# Patient Record
Sex: Female | Born: 1960 | Race: Black or African American | Hispanic: No | Smoking: Current every day smoker
Health system: Southern US, Community
[De-identification: ages and names within clinical notes are randomized; demographics above are authoritative.]

## PROBLEM LIST (undated history)

## (undated) DIAGNOSIS — I10 Essential (primary) hypertension: Secondary | ICD-10-CM

## (undated) DIAGNOSIS — D72829 Elevated white blood cell count, unspecified: Secondary | ICD-10-CM

## (undated) DIAGNOSIS — R519 Headache, unspecified: Secondary | ICD-10-CM

## (undated) DIAGNOSIS — K219 Gastro-esophageal reflux disease without esophagitis: Secondary | ICD-10-CM

## (undated) DIAGNOSIS — K5909 Other constipation: Secondary | ICD-10-CM

## (undated) DIAGNOSIS — F32A Depression, unspecified: Secondary | ICD-10-CM

## (undated) DIAGNOSIS — E119 Type 2 diabetes mellitus without complications: Secondary | ICD-10-CM

## (undated) DIAGNOSIS — F419 Anxiety disorder, unspecified: Secondary | ICD-10-CM

## (undated) DIAGNOSIS — E785 Hyperlipidemia, unspecified: Secondary | ICD-10-CM

## (undated) DIAGNOSIS — G4733 Obstructive sleep apnea (adult) (pediatric): Secondary | ICD-10-CM

## (undated) DIAGNOSIS — Z9989 Dependence on other enabling machines and devices: Secondary | ICD-10-CM

## (undated) DIAGNOSIS — R51 Headache: Secondary | ICD-10-CM

## (undated) DIAGNOSIS — Z87440 Personal history of urinary (tract) infections: Secondary | ICD-10-CM

## (undated) DIAGNOSIS — F329 Major depressive disorder, single episode, unspecified: Secondary | ICD-10-CM

## (undated) HISTORY — DX: Major depressive disorder, single episode, unspecified: F32.9

## (undated) HISTORY — DX: Headache, unspecified: R51.9

## (undated) HISTORY — DX: Hyperlipidemia, unspecified: E78.5

## (undated) HISTORY — DX: Headache: R51

## (undated) HISTORY — DX: Depression, unspecified: F32.A

## (undated) HISTORY — DX: Gastro-esophageal reflux disease without esophagitis: K21.9

## (undated) HISTORY — DX: Essential (primary) hypertension: I10

## (undated) HISTORY — DX: Type 2 diabetes mellitus without complications: E11.9

## (undated) HISTORY — DX: Elevated white blood cell count, unspecified: D72.829

## (undated) HISTORY — DX: Obstructive sleep apnea (adult) (pediatric): G47.33

## (undated) HISTORY — DX: Anxiety disorder, unspecified: F41.9

## (undated) HISTORY — DX: Other constipation: K59.09

## (undated) HISTORY — DX: Morbid (severe) obesity due to excess calories: E66.01

## (undated) HISTORY — DX: Personal history of urinary (tract) infections: Z87.440

## (undated) HISTORY — DX: Dependence on other enabling machines and devices: Z99.89

---

## 1996-05-21 HISTORY — PX: ABDOMINAL HYSTERECTOMY: SHX81

## 1999-06-26 ENCOUNTER — Other Ambulatory Visit: Admission: RE | Admit: 1999-06-26 | Discharge: 1999-06-26 | Payer: Self-pay | Admitting: *Deleted

## 2000-07-26 ENCOUNTER — Other Ambulatory Visit: Admission: RE | Admit: 2000-07-26 | Discharge: 2000-07-26 | Payer: Self-pay | Admitting: *Deleted

## 2001-02-09 ENCOUNTER — Ambulatory Visit (HOSPITAL_BASED_OUTPATIENT_CLINIC_OR_DEPARTMENT_OTHER): Admission: RE | Admit: 2001-02-09 | Discharge: 2001-02-09 | Payer: Self-pay | Admitting: Family Medicine

## 2004-11-08 ENCOUNTER — Ambulatory Visit: Payer: Self-pay | Admitting: Cardiology

## 2009-03-18 ENCOUNTER — Ambulatory Visit: Payer: Self-pay | Admitting: Cardiology

## 2010-05-21 HISTORY — PX: COLONOSCOPY: SHX174

## 2015-03-22 ENCOUNTER — Other Ambulatory Visit: Payer: Self-pay | Admitting: "Endocrinology

## 2015-04-20 ENCOUNTER — Ambulatory Visit: Payer: Self-pay | Admitting: "Endocrinology

## 2015-04-29 ENCOUNTER — Other Ambulatory Visit: Payer: Self-pay | Admitting: "Endocrinology

## 2015-08-08 LAB — HEMOGLOBIN A1C: Hemoglobin A1C: 9.4

## 2015-08-16 ENCOUNTER — Other Ambulatory Visit: Payer: Self-pay | Admitting: "Endocrinology

## 2015-08-22 ENCOUNTER — Ambulatory Visit (INDEPENDENT_AMBULATORY_CARE_PROVIDER_SITE_OTHER): Payer: BLUE CROSS/BLUE SHIELD | Admitting: "Endocrinology

## 2015-08-22 ENCOUNTER — Encounter: Payer: Self-pay | Admitting: "Endocrinology

## 2015-08-22 VITALS — BP 153/81 | HR 89 | Ht 65.0 in | Wt 223.0 lb

## 2015-08-22 DIAGNOSIS — E118 Type 2 diabetes mellitus with unspecified complications: Secondary | ICD-10-CM | POA: Diagnosis not present

## 2015-08-22 DIAGNOSIS — Z9119 Patient's noncompliance with other medical treatment and regimen: Secondary | ICD-10-CM | POA: Insufficient documentation

## 2015-08-22 DIAGNOSIS — I10 Essential (primary) hypertension: Secondary | ICD-10-CM | POA: Diagnosis not present

## 2015-08-22 DIAGNOSIS — IMO0002 Reserved for concepts with insufficient information to code with codable children: Secondary | ICD-10-CM

## 2015-08-22 DIAGNOSIS — E1165 Type 2 diabetes mellitus with hyperglycemia: Secondary | ICD-10-CM | POA: Diagnosis not present

## 2015-08-22 DIAGNOSIS — Z91199 Patient's noncompliance with other medical treatment and regimen due to unspecified reason: Secondary | ICD-10-CM | POA: Insufficient documentation

## 2015-08-22 DIAGNOSIS — E782 Mixed hyperlipidemia: Secondary | ICD-10-CM | POA: Insufficient documentation

## 2015-08-22 DIAGNOSIS — E785 Hyperlipidemia, unspecified: Secondary | ICD-10-CM | POA: Diagnosis not present

## 2015-08-22 MED ORDER — DAPAGLIFLOZIN PROPANEDIOL 10 MG PO TABS: 10.0000 mg | ORAL_TABLET | Freq: Every day | ORAL | Status: DC

## 2015-08-22 MED ORDER — LIRAGLUTIDE 18 MG/3ML ~~LOC~~ SOPN: 1.8000 mg | PEN_INJECTOR | Freq: Every day | SUBCUTANEOUS | Status: DC

## 2015-08-22 NOTE — Progress Notes (Signed)
Subjective:    Patient ID: Casey Johnston, female    DOB: Oct 02, 1960, PCP No primary care provider on file.   Past Medical History  Diagnosis Date  . Diabetes mellitus, type II ([place])   . Hyperlipidemia    Past Surgical History  Procedure Laterality Date  . Abdominal hysterectomy     Social History   Social History  . Marital Status: Widowed    Spouse Name: N/A  . Number of Children: N/A  . Years of Education: N/A   Social History Main Topics  . Smoking status: Former Research scientist (life sciences)  . Smokeless tobacco: None  . Alcohol Use: No  . Drug Use: No  . Sexual Activity: Not Asked   Other Topics Concern  . None   Social History Narrative  . None   Outpatient Encounter Prescriptions as of 08/22/2015  Medication Sig  . dapagliflozin propanediol (FARXIGA) 10 MG TABS tablet Take 10 mg by mouth daily.  Marland Kitchen LANTUS SOLOSTAR 100 UNIT/ML Solostar Pen INJECT 40 UNITS SUBCUTANEOUSLY AT BEDTIME  . Liraglutide (VICTOZA) 18 MG/3ML SOPN Inject 0.3 mLs (1.8 mg total) into the skin daily.  . metFORMIN (GLUCOPHAGE) 1000 MG tablet TAKE ONE TABLET BY MOUTH TWICE DAILY  . simvastatin (ZOCOR) 20 MG tablet Take 20 mg by mouth daily.  . Vitamin D, Ergocalciferol, (DRISDOL) 50000 units CAPS capsule Take 50,000 Units by mouth every 7 (seven) days.  . [DISCONTINUED] FARXIGA 10 MG TABS tablet TAKE ONE TABLET BY MOUTH ONCE DAILY  . [DISCONTINUED] Liraglutide (VICTOZA) 18 MG/3ML SOPN Inject 1.8 mg into the skin daily.   No facility-administered encounter medications on file as of 08/22/2015.   ALLERGIES: No Known Allergies VACCINATION STATUS:  There is no immunization history on file for this patient.  Diabetes She presents for her follow-up diabetic visit. She has type 2 diabetes mellitus. Onset time: She was diagnosed at approximate age of 32 years. Her disease course has been worsening. There are no hypoglycemic associated symptoms. Pertinent negatives for hypoglycemia include no confusion, headaches,  pallor or seizures. Associated symptoms include fatigue, polydipsia, polyuria and visual change. Pertinent negatives for diabetes include no chest pain and no polyphagia. There are no hypoglycemic complications. Symptoms are worsening. There are no diabetic complications. Risk factors for coronary artery disease include diabetes mellitus, dyslipidemia, hypertension, obesity, sedentary lifestyle and tobacco exposure. Current diabetic treatments: She has not been consistent in taking her medications. She is compliant with treatment none of the time. Her weight is stable. She is following a generally unhealthy diet. She never participates in exercise. Home blood sugar record trend: She came with no meter nor logs. Her A1c is higher at 9.4% from 08/08/2015. Her overall blood glucose range is >200 mg/dl. An ACE inhibitor/angiotensin II receptor blocker is not being taken.  Hyperlipidemia This is a chronic problem. The current episode started more than 1 year ago. Pertinent negatives include no chest pain, myalgias or shortness of breath. Current antihyperlipidemic treatment includes statins. Risk factors for coronary artery disease include a sedentary lifestyle, dyslipidemia and diabetes mellitus.  Hypertension This is a chronic problem. The current episode started more than 1 year ago. Pertinent negatives include no chest pain, headaches, palpitations or shortness of breath. Risk factors for coronary artery disease include smoking/tobacco exposure, obesity, dyslipidemia, diabetes mellitus and sedentary lifestyle.     Review of Systems  Constitutional: Positive for fatigue. Negative for fever, chills and unexpected weight change.  HENT: Negative for trouble swallowing and voice change.   Eyes: Negative  for visual disturbance.  Respiratory: Negative for cough, shortness of breath and wheezing.   Cardiovascular: Negative for chest pain, palpitations and leg swelling.  Gastrointestinal: Negative for nausea,  vomiting and diarrhea.  Endocrine: Positive for polydipsia and polyuria. Negative for cold intolerance, heat intolerance and polyphagia.  Musculoskeletal: Negative for myalgias and arthralgias.  Skin: Negative for color change, pallor, rash and wound.  Neurological: Negative for seizures and headaches.  Psychiatric/Behavioral: Negative for suicidal ideas and confusion.    Objective:    BP 153/81 mmHg  Pulse 89  Ht 5\' 5"  (1.651 m)  Wt 223 lb (101.152 kg)  BMI 37.11 kg/m2  SpO2 96%  Wt Readings from Last 3 Encounters:  08/22/15 223 lb (101.152 kg)    Physical Exam  Constitutional: She is oriented to person, place, and time. She appears well-developed.  HENT:  Head: Normocephalic and atraumatic.  Eyes: EOM are normal.  Neck: Normal range of motion. Neck supple. No tracheal deviation present. No thyromegaly present.  Cardiovascular: Normal rate and regular rhythm.   Pulmonary/Chest: Effort normal and breath sounds normal.  Abdominal: Soft. Bowel sounds are normal. There is no tenderness. There is no guarding.  Musculoskeletal: Normal range of motion. She exhibits no edema.  Neurological: She is alert and oriented to person, place, and time. She has normal reflexes. No cranial nerve deficit. Coordination normal.  Skin: Skin is warm and dry. No rash noted. No erythema. No pallor.  Psychiatric: She has a normal mood and affect. Judgment normal.        Assessment & Plan:   1. Uncontrolled type 2 diabetes mellitus with complication, without long-term current use of insulin ([place])   - Her diabetes is  complicated by noncompliance and nonadherence and patient remains at a high risk for more acute and chronic complications of diabetes which include CAD, CVA, CKD, retinopathy, and neuropathy. These are all discussed in detail with the patient.  Patient came with elevated A1c of 9.4%, without any logs nor meter. -She is alarmingly noncompliant and displays unconcerned affect.  - I  have re-counseled the patient on diet management and weight loss  by adopting a carbohydrate restricted / protein rich  Diet.  - Suggestion is made for patient to avoid simple carbohydrates   from their diet including Cakes , Desserts, Ice Cream,  Soda (  diet and regular) , Sweet Tea , Candies,  Chips, Cookies, Artificial Sweeteners,   and "Sugar-free" Products .  This will help patient to have stable blood glucose profile and potentially avoid unintended  Weight gain.  - Patient is advised to stick to a routine mealtimes to eat 3 meals  a day and avoid unnecessary snacks ( to snack only to correct hypoglycemia).  - The patient  has been  scheduled with Jearld Fenton, RDN, CDE for individualized DM education.  - I have approached patient with the following individualized plan to manage diabetes and patient agrees.  - I will proceed with basal insulin 40 units QHS,  and initiate strict monitoring of glucose  AC and HS. -If her readings are significantly above target, she will be approached for a brief period of basal/bolus insulin therapy.  -Patient is encouraged to call clinic for blood glucose levels less than 70 or above 300 mg /dl. - I will continue Victoza 1.8 mg daily and metformin 1000 mg by mouth twice a day as well as  Farxiga 10 mg by mouth daily. Side effects and precautions discussed with her.  - Patient specific  target  for A1c; LDL, HDL, Triglycerides, and  Waist Circumference were discussed in detail.  2) BP/HTN: Controlled . Continue current medications. 3) Lipids/HPL:  continue statins. 4)  Weight/Diet: CDE consult in progress, exercise, and carbohydrates information provided.  5) Chronic Care/Health Maintenance:  -Patient  is on  statin medications and encouraged to continue to follow up with Ophthalmology, Podiatrist at least yearly or according to recommendations, and advised to  stay away from smoking. I have recommended yearly flu vaccine and pneumonia vaccination at  least every 5 years; moderate intensity exercise for up to 150 minutes weekly; and  sleep for at least 7 hours a day.  - 25 minutes of time was spent on the care of this patient , 50% of which was applied for counseling on diabetes complications and their preventions.  - I advised patient to maintain close follow up with No primary care provider on file. for primary care needs.  Patient is asked to bring meter and  blood glucose logs during their next visit.   Follow up plan: -Return in about 1 week (around 08/29/2015) for diabetes, high blood pressure, high cholesterol, follow up with meter and logs- no labs.  Glade Lloyd, MD Phone: [phone]  Fax: [phone]   08/22/2015, 7:28 PM

## 2015-08-30 ENCOUNTER — Encounter: Payer: Self-pay | Admitting: "Endocrinology

## 2015-09-05 ENCOUNTER — Ambulatory Visit: Payer: BLUE CROSS/BLUE SHIELD | Admitting: "Endocrinology

## 2015-09-21 ENCOUNTER — Other Ambulatory Visit: Payer: Self-pay | Admitting: "Endocrinology

## 2015-12-07 ENCOUNTER — Other Ambulatory Visit: Payer: Self-pay | Admitting: "Endocrinology

## 2015-12-15 ENCOUNTER — Other Ambulatory Visit: Payer: Self-pay

## 2015-12-15 MED ORDER — INSULIN DETEMIR 100 UNIT/ML FLEXPEN: 40.0000 [IU] | Freq: Every day | SUBCUTANEOUS | 2 refills | Status: DC

## 2015-12-15 MED ORDER — INSULIN DETEMIR 100 UNIT/ML FLEXPEN: 40.0000 [IU] | PEN_INJECTOR | Freq: Every day | SUBCUTANEOUS | 2 refills | Status: DC

## 2015-12-20 DIAGNOSIS — D72829 Elevated white blood cell count, unspecified: Secondary | ICD-10-CM

## 2015-12-20 HISTORY — DX: Elevated white blood cell count, unspecified: D72.829

## 2016-01-18 ENCOUNTER — Ambulatory Visit (INDEPENDENT_AMBULATORY_CARE_PROVIDER_SITE_OTHER): Payer: BLUE CROSS/BLUE SHIELD | Admitting: Family Medicine

## 2016-01-18 ENCOUNTER — Encounter: Payer: Self-pay | Admitting: Family Medicine

## 2016-01-18 VITALS — BP 164/88 | HR 90 | Temp 98.0°F | Resp 16 | Ht 65.0 in | Wt 215.0 lb

## 2016-01-18 DIAGNOSIS — E785 Hyperlipidemia, unspecified: Secondary | ICD-10-CM | POA: Diagnosis not present

## 2016-01-18 DIAGNOSIS — F331 Major depressive disorder, recurrent, moderate: Secondary | ICD-10-CM | POA: Diagnosis not present

## 2016-01-18 DIAGNOSIS — I10 Essential (primary) hypertension: Secondary | ICD-10-CM

## 2016-01-18 DIAGNOSIS — R5382 Chronic fatigue, unspecified: Secondary | ICD-10-CM

## 2016-01-18 DIAGNOSIS — R7989 Other specified abnormal findings of blood chemistry: Secondary | ICD-10-CM | POA: Diagnosis not present

## 2016-01-18 LAB — CBC WITH DIFFERENTIAL/PLATELET
BASOS ABS: 0.1 10*3/uL (ref 0.0–0.1)
BASOS PCT: 0.8 % (ref 0.0–3.0)
EOS ABS: 0.1 10*3/uL (ref 0.0–0.7)
Eosinophils Relative: 0.9 % (ref 0.0–5.0)
HCT: 39.7 % (ref 36.0–46.0)
Hemoglobin: 13.4 g/dL (ref 12.0–15.0)
LYMPHS ABS: 6.4 10*3/uL — AB (ref 0.7–4.0)
Lymphocytes Relative: 51.9 % — ABNORMAL HIGH (ref 12.0–46.0)
MCHC: 33.9 g/dL (ref 30.0–36.0)
MCV: 88.6 fl (ref 78.0–100.0)
MONO ABS: 0.3 10*3/uL (ref 0.1–1.0)
Monocytes Relative: 2.6 % — ABNORMAL LOW (ref 3.0–12.0)
NEUTROS ABS: 5.4 10*3/uL (ref 1.4–7.7)
NEUTROS PCT: 43.8 % (ref 43.0–77.0)
PLATELETS: 228 10*3/uL (ref 150.0–400.0)
RBC: 4.47 Mil/uL (ref 3.87–5.11)
RDW: 13.5 % (ref 11.5–15.5)
WBC: 12.2 10*3/uL — ABNORMAL HIGH (ref 4.0–10.5)

## 2016-01-18 LAB — VITAMIN B12: VITAMIN B 12: 416 pg/mL (ref 211–911)

## 2016-01-18 LAB — COMPREHENSIVE METABOLIC PANEL
ALBUMIN: 4 g/dL (ref 3.5–5.2)
ALT: 31 U/L (ref 0–35)
AST: 31 U/L (ref 0–37)
Alkaline Phosphatase: 86 U/L (ref 39–117)
BUN: 9 mg/dL (ref 6–23)
CHLORIDE: 100 meq/L (ref 96–112)
CO2: 28 meq/L (ref 19–32)
Calcium: 9.2 mg/dL (ref 8.4–10.5)
Creatinine, Ser: 0.65 mg/dL (ref 0.40–1.20)
GFR: 121.08 mL/min (ref >60.00)
Glucose, Bld: 267 mg/dL — ABNORMAL HIGH (ref 70–99)
POTASSIUM: 4.4 meq/L (ref 3.5–5.1)
SODIUM: 136 meq/L (ref 135–145)
Total Bilirubin: 0.3 mg/dL (ref 0.2–1.2)
Total Protein: 7.3 g/dL (ref 6.0–8.3)

## 2016-01-18 LAB — LIPID PANEL
Cholesterol: 237 mg/dL — ABNORMAL HIGH (ref 0–200)
HDL: 41.3 mg/dL (ref >39.00)
Total CHOL/HDL Ratio: 6

## 2016-01-18 LAB — TSH: TSH: 1.23 u[IU]/mL (ref 0.35–4.50)

## 2016-01-18 LAB — LDL CHOLESTEROL, DIRECT: LDL DIRECT: 119 mg/dL

## 2016-01-18 MED ORDER — DULOXETINE HCL 30 MG PO CPEP: 30.0000 mg | ORAL_CAPSULE | Freq: Every day | ORAL | 1 refills | Status: DC

## 2016-01-18 NOTE — Progress Notes (Signed)
Office Note 01/18/2016  CC:  Chief Complaint  Patient presents with  . Establish Care    HPI:  Casey Johnston is a 55 y.o. Black female who is here to establish care. Patient's most recent primary MD: MD in [place] who she last saw a few years ago b/c MD moved. Old records were not reviewed prior to or during today's visit.  About 2 yrs of chronic fatigue and excessive daytime sleepiness.  Works 7[employer].  Gets about 6 hours of sleep per night. Sleeps a lot on weekends.  She snores, has witnessed OSA.  Has been dx'd with OSA and sleeps with CPAP but not lately b/c it stopped briefly and then started back up.  She feels like she is due for new CPAP machine ([place] apoth). Admits to feeling depressed, anhedonic, no motivation to do anything.  Appetite is fair.  She is anxious about her son going to [place].  Says she was on antidepressant many years ago--amitriptyline.  No SI or HI.  Past Medical History:  Diagnosis Date  . Diabetes mellitus, type II ([place])    Dr. Dorris Fetch managing  . Frequent headaches chronic   ibuprofen helps some  . History of UTI    Recurrent per pt (approx 3 per year)  . Hyperlipidemia   . Hypertension   . Morbid obesity ([place])   . OSA on CPAP     Past Surgical History:  Procedure Laterality Date  . ABDOMINAL HYSTERECTOMY     Dysfunctional uterine bleeding.  No hx of abnormal paps.  . COLONOSCOPY  approx 2012   Pt states it was done at [facility] by a GI MD from [place].  Recall 10 yrs.    Family History  Problem Relation Age of Onset  . Cancer Mother   . COPD Father     Social History   Social History  . Marital status: Widowed    Spouse name: N/A  . Number of children: N/A  . Years of education: N/A   Occupational History  . Not on file.   Social History Main Topics  . Smoking status: Former Research scientist (life sciences)  . Smokeless tobacco: Never Used  . Alcohol use No  . Drug use: No  . Sexual activity: Not on file   Other Topics Concern  . Not on  file   Social History Narrative   Separated x 20 yrs, one daughter.  She lives with her.  Great niece and nephew live with her as well.   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   Tob: 7 pack year hx, quit 2010.   Alc: none   Exercise: none    Outpatient Encounter Prescriptions as of 01/18/2016  Medication Sig  . dapagliflozin propanediol (FARXIGA) 10 MG TABS tablet Take 10 mg by mouth daily.  . Insulin Detemir (LEVEMIR FLEXPEN) 100 UNIT/ML Pen Inject 40 Units into the skin at bedtime.  . Liraglutide (VICTOZA) 18 MG/3ML SOPN Inject 0.3 mLs (1.8 mg total) into the skin daily.  . metFORMIN (GLUCOPHAGE) 1000 MG tablet TAKE ONE TABLET BY MOUTH TWICE DAILY  . DULoxetine (CYMBALTA) 30 MG capsule Take 1 capsule (30 mg total) by mouth daily.  . simvastatin (ZOCOR) 20 MG tablet Take 20 mg by mouth daily.  . Vitamin D, Ergocalciferol, (DRISDOL) 50000 units CAPS capsule Take 50,000 Units by mouth every 7 (seven) days.   No facility-administered encounter medications on file as of 01/18/2016.     No Known Allergies  ROS Review of Systems  Constitutional: Positive  for fatigue (see HPI). Negative for fever.  HENT: Negative for congestion and sore throat.   Eyes: Negative for visual disturbance.  Respiratory: Negative for cough.   Cardiovascular: Negative for chest pain.  Gastrointestinal: Negative for abdominal pain and nausea.  Genitourinary: Negative for dysuria.  Musculoskeletal: Negative for back pain and joint swelling.  Skin: Negative for rash.  Neurological: Negative for weakness and headaches.  Hematological: Negative for adenopathy.    PE; Blood pressure (!) 164/88, pulse 90, temperature 98 F (36.7 C), temperature source Oral, resp. rate 16, height 5\' 5"  (1.651 m), weight 215 lb (97.5 kg), SpO2 96 %. Gen: Alert, well appearing.  Patient is oriented to person, place, time, and situation. AFFECT: pleasant, lucid thought and speech. Tearful at times when discussing her  depression. CV: RRR, no m/r/g.   LUNGS: CTA bilat, nonlabored resps, good aeration in all lung fields. EXT: no clubbing or cyanosis.  Trace to 1+ pitting edema in both pretibial regions.  Pertinent labs:   Lab Results  Component Value Date   HGBA1C 9.4 08/08/2015   08/08/15 CMP at Dr. Liliane Channel was all normal--this has been scanned into lab section of chart.   ASSESSMENT AND PLAN:   New pt: no PCP records to obtain--pt cannot recall name of clinic where she used to go in [place].  1) DM 2: managed by Dr. Dorris Fetch at [facility] end[facility]ology.  Pt has plans for routine f/u soon.  2) HTN: BP elevated here today.  She was upset/discussing depression. No change in med made today. Check lytes/cr.  3) HLD: tolerating statin.  FLP check today + AST/ALT.  4) Major depressive disorder, moderate, recurrent:  Start duloxetine 30mg  qd.  Therapeutic expectations and side effect profile of medication discussed today.  Patient's questions answered.  5) Chronic fatigue: suspect this is related to her stressfull life + depression. She repeatedly said she has "a lot going on at home and at work".  Additionally, she needs to contact [place] about possibly getting a new CPAP b/c going w/out this will compound her fatigue. Check CBC, TSH, and vit B12 level today.  She is due for pneumovax and Tdap but declined these today. She is due for mammogram but wants to wait on ordering this--plus she says that in the past a remote imaging vehicle would go to [employer] for women to get this done.  An After Visit Summary was printed and given to the patient.  Signed:  Crissie Sickles, MD           01/18/2016  Return in about 4 weeks (around 02/15/2016) for f/u depression/fatigue.  Signed:  Crissie Sickles, MD           01/18/2016

## 2016-01-19 ENCOUNTER — Other Ambulatory Visit: Payer: BLUE CROSS/BLUE SHIELD

## 2016-01-19 DIAGNOSIS — D72829 Elevated white blood cell count, unspecified: Secondary | ICD-10-CM

## 2016-01-20 LAB — PATHOLOGIST SMEAR REVIEW

## 2016-01-24 ENCOUNTER — Ambulatory Visit: Payer: Self-pay | Admitting: Family Medicine

## 2016-02-17 ENCOUNTER — Ambulatory Visit (INDEPENDENT_AMBULATORY_CARE_PROVIDER_SITE_OTHER): Payer: BLUE CROSS/BLUE SHIELD | Admitting: Family Medicine

## 2016-02-17 ENCOUNTER — Encounter: Payer: Self-pay | Admitting: Family Medicine

## 2016-02-17 VITALS — BP 145/84 | HR 99 | Temp 97.9°F | Resp 16 | Wt 215.8 lb

## 2016-02-17 DIAGNOSIS — F331 Major depressive disorder, recurrent, moderate: Secondary | ICD-10-CM

## 2016-02-17 DIAGNOSIS — Z23 Encounter for immunization: Secondary | ICD-10-CM | POA: Diagnosis not present

## 2016-02-17 DIAGNOSIS — D72829 Elevated white blood cell count, unspecified: Secondary | ICD-10-CM | POA: Diagnosis not present

## 2016-02-17 LAB — CBC WITH DIFFERENTIAL/PLATELET
BASOS PCT: 0 %
Basophils Absolute: 0 cells/uL (ref 0–200)
EOS ABS: 250 {cells}/uL (ref 15–500)
Eosinophils Relative: 2 %
HEMATOCRIT: 38.9 % (ref 35.0–45.0)
HEMOGLOBIN: 13.1 g/dL (ref 11.7–15.5)
LYMPHS PCT: 60 %
Lymphs Abs: 7500 cells/uL — ABNORMAL HIGH (ref 850–3900)
MCH: 30.1 pg (ref 27.0–33.0)
MCHC: 33.7 g/dL (ref 32.0–36.0)
MCV: 89.4 fL (ref 80.0–100.0)
MONO ABS: 500 {cells}/uL (ref 200–950)
MPV: 11.8 fL (ref 7.5–12.5)
Monocytes Relative: 4 %
Neutro Abs: 4250 cells/uL (ref 1500–7800)
Neutrophils Relative %: 34 %
Platelets: 234 10*3/uL (ref 140–400)
RBC: 4.35 MIL/uL (ref 3.80–5.10)
RDW: 13.7 % (ref 11.0–15.0)
WBC: 12.5 10*3/uL — ABNORMAL HIGH (ref 3.8–10.8)

## 2016-02-17 MED ORDER — DULOXETINE HCL 30 MG PO CPEP: 30.0000 mg | ORAL_CAPSULE | Freq: Every day | ORAL | 6 refills | Status: DC

## 2016-02-17 NOTE — Addendum Note (Signed)
Addended by: Gordy Councilman on: 02/17/2016 04:29 PM   Modules accepted: Orders

## 2016-02-17 NOTE — Progress Notes (Signed)
Pre visit review using our clinic review tool, if applicable. No additional management support is needed unless otherwise documented below in the visit note. 

## 2016-02-17 NOTE — Progress Notes (Signed)
OFFICE VISIT  02/17/2016   CC:  Chief Complaint  Patient presents with  . Follow-up    chronic fatigue   HPI:    Patient is a 55 y.o.  female who presents for 1 mo f/u MDD, started 30mg  qd duloxetine last visit. Feels like she is improved.  Sleep is normalizing.  Anhedonia lessening.  Appetite is normal.  Energy level coming back a little. Denies side effect (initially had nausea and HA but this resolved after a week or two). She seems satisfied with her response to the med.  She has not made any steps towards figuring out the problem with her CPAP.  Needs to contact [employer].    Last time she was here we did some lab work and her WBC count was slightly elevated, pathologist smear review was reassuring.  We are going to repeat the cbc w/diff today.  Past Medical History:  Diagnosis Date  . Anxiety and depression   . Diabetes mellitus, type II ([place])    Dr. Dorris Fetch managing  . Frequent headaches chronic   ibuprofen helps some  . History of UTI    Recurrent per pt (approx 3 per year)  . Hyperlipidemia   . Hypertension   . Morbid obesity ([place])   . OSA on CPAP   OSA NOT ON CPAP  Past Surgical History:  Procedure Laterality Date  . ABDOMINAL HYSTERECTOMY     Dysfunctional uterine bleeding.  No hx of abnormal paps.  . COLONOSCOPY  approx 2012   Pt states it was done at [facility] by a GI MD from [place].  Recall 10 yrs.    Outpatient Medications Prior to Visit  Medication Sig Dispense Refill  . dapagliflozin propanediol (FARXIGA) 10 MG TABS tablet Take 10 mg by mouth daily. 30 tablet 2  . Insulin Detemir (LEVEMIR FLEXPEN) 100 UNIT/ML Pen Inject 40 Units into the skin at bedtime. 15 mL 2  . Liraglutide (VICTOZA) 18 MG/3ML SOPN Inject 0.3 mLs (1.8 mg total) into the skin daily. 6 mL 2  . metFORMIN (GLUCOPHAGE) 1000 MG tablet TAKE ONE TABLET BY MOUTH TWICE DAILY 180 tablet 0  . simvastatin (ZOCOR) 20 MG tablet Take 20 mg by mouth daily.    . DULoxetine (CYMBALTA) 30 MG  capsule Take 1 capsule (30 mg total) by mouth daily. 30 capsule 1  . Vitamin D, Ergocalciferol, (DRISDOL) 50000 units CAPS capsule Take 50,000 Units by mouth every 7 (seven) days.     No facility-administered medications prior to visit.     No Known Allergies  ROS As per HPI  PE: Blood pressure (!) 145/84, pulse 99, temperature 97.9 F (36.6 C), temperature source Oral, resp. rate 16, weight 215 lb 12.8 oz (97.9 kg), SpO2 97 %. Gen: Alert, well appearing.  Patient is oriented to person, place, time, and situation. AFFECT: pleasant, lucid thought and speech. No further exam today.  LABS:  Lab Results  Component Value Date   TSH 1.23 01/18/2016   Lab Results  Component Value Date   WBC 12.2 (H) 01/18/2016   HGB 13.4 01/18/2016   HCT 39.7 01/18/2016   MCV 88.6 01/18/2016   PLT 228.0 01/18/2016   Lab Results  Component Value Date   CREATININE 0.65 01/18/2016   BUN 9 01/18/2016   NA 136 01/18/2016   K 4.4 01/18/2016   CL 100 01/18/2016   CO2 28 01/18/2016   Lab Results  Component Value Date   ALT 31 01/18/2016   AST 31 01/18/2016  ALKPHOS 86 01/18/2016   BILITOT 0.3 01/18/2016   Lab Results  Component Value Date   CHOL 237 (H) 01/18/2016   Lab Results  Component Value Date   HDL 41.30 01/18/2016   No results found for: [facility] Lab Results  Component Value Date   TRIG (H) 01/18/2016    419.0 Triglyceride is over 400; calculations on Lipids are invalid.   Lab Results  Component Value Date   CHOLHDL 6 01/18/2016   Lab Results  Component Value Date   HGBA1C 9.4 08/08/2015    IMPRESSION AND PLAN:  1) MDD, moderate, recurrent: improving significantly on cymbalta 30mg  qd. The current medical regimen is effective;  continue present plan and medications.  2) Mild leukocytosis: path smear review reasurring.  Repeat CBC w/diff today to make sure stable/normalized.  3) Preventative health care: she declined flu vaccine but she did get TdaP today.  An  After Visit Summary was printed and given to the patient.  FOLLOW UP: Return in about 3 months (around 05/18/2016) for routine chronic illness f/u.  Signed:  Crissie Sickles, MD           02/17/2016

## 2016-02-20 ENCOUNTER — Encounter: Payer: Self-pay | Admitting: Family Medicine

## 2016-03-12 ENCOUNTER — Ambulatory Visit: Payer: BLUE CROSS/BLUE SHIELD | Admitting: Family Medicine

## 2016-03-16 ENCOUNTER — Other Ambulatory Visit: Payer: Self-pay

## 2016-03-16 MED ORDER — BLOOD GLUCOSE MONITOR KIT: PACK | 0 refills | Status: DC

## 2016-04-24 ENCOUNTER — Other Ambulatory Visit: Payer: Self-pay | Admitting: "Endocrinology

## 2016-05-16 ENCOUNTER — Other Ambulatory Visit: Payer: Self-pay | Admitting: "Endocrinology

## 2016-05-16 DIAGNOSIS — E1165 Type 2 diabetes mellitus with hyperglycemia: Secondary | ICD-10-CM

## 2016-05-16 DIAGNOSIS — E118 Type 2 diabetes mellitus with unspecified complications: Principal | ICD-10-CM

## 2016-05-18 ENCOUNTER — Ambulatory Visit: Payer: BLUE CROSS/BLUE SHIELD | Admitting: Family Medicine

## 2016-05-26 LAB — COMPREHENSIVE METABOLIC PANEL
ALBUMIN: 3.9 g/dL (ref 3.6–5.1)
ALK PHOS: 81 U/L (ref 33–130)
ALT: 22 U/L (ref 6–29)
AST: 20 U/L (ref 10–35)
BUN: 12 mg/dL (ref 7–25)
CALCIUM: 9.9 mg/dL (ref 8.6–10.4)
CO2: 23 mmol/L (ref 20–31)
Chloride: 103 mmol/L (ref 98–110)
Creat: 0.75 mg/dL (ref 0.50–1.05)
GLUCOSE: 344 mg/dL — AB (ref 65–99)
POTASSIUM: 4.2 mmol/L (ref 3.5–5.3)
Sodium: 138 mmol/L (ref 135–146)
TOTAL PROTEIN: 7 g/dL (ref 6.1–8.1)
Total Bilirubin: 0.4 mg/dL (ref 0.2–1.2)

## 2016-05-28 LAB — HEMOGLOBIN A1C: Hgb A1c MFr Bld: >14 % — ABNORMAL HIGH (ref <5.7)

## 2016-07-09 ENCOUNTER — Ambulatory Visit (INDEPENDENT_AMBULATORY_CARE_PROVIDER_SITE_OTHER): Payer: BLUE CROSS/BLUE SHIELD | Admitting: "Endocrinology

## 2016-07-09 ENCOUNTER — Encounter: Payer: Self-pay | Admitting: "Endocrinology

## 2016-07-09 VITALS — BP 170/83 | HR 81 | Ht 65.0 in | Wt 204.0 lb

## 2016-07-09 DIAGNOSIS — Z91199 Patient's noncompliance with other medical treatment and regimen due to unspecified reason: Secondary | ICD-10-CM

## 2016-07-09 DIAGNOSIS — I10 Essential (primary) hypertension: Secondary | ICD-10-CM | POA: Diagnosis not present

## 2016-07-09 DIAGNOSIS — E782 Mixed hyperlipidemia: Secondary | ICD-10-CM

## 2016-07-09 DIAGNOSIS — IMO0002 Reserved for concepts with insufficient information to code with codable children: Secondary | ICD-10-CM

## 2016-07-09 DIAGNOSIS — E118 Type 2 diabetes mellitus with unspecified complications: Secondary | ICD-10-CM

## 2016-07-09 DIAGNOSIS — Z9119 Patient's noncompliance with other medical treatment and regimen: Secondary | ICD-10-CM

## 2016-07-09 DIAGNOSIS — E1165 Type 2 diabetes mellitus with hyperglycemia: Secondary | ICD-10-CM

## 2016-07-09 MED ORDER — LISINOPRIL-HYDROCHLOROTHIAZIDE 20-25 MG PO TABS: 1.0000 | ORAL_TABLET | Freq: Every day | ORAL | 0 refills | Status: DC

## 2016-07-09 MED ORDER — DAPAGLIFLOZIN PROPANEDIOL 10 MG PO TABS: 10.0000 mg | ORAL_TABLET | Freq: Every day | ORAL | 0 refills | Status: DC

## 2016-07-09 MED ORDER — METFORMIN HCL 1000 MG PO TABS: 1000.0000 mg | ORAL_TABLET | Freq: Two times a day (BID) | ORAL | 0 refills | Status: DC

## 2016-07-09 MED ORDER — INSULIN DETEMIR 100 UNIT/ML FLEXPEN: 40.0000 [IU] | PEN_INJECTOR | Freq: Every day | SUBCUTANEOUS | 0 refills | Status: DC

## 2016-07-09 NOTE — Patient Instructions (Signed)

## 2016-07-09 NOTE — Progress Notes (Signed)
Subjective:    Patient ID: Casey Johnston, female    DOB: [birth year], PCP Tammi Sou, MD   Past Medical History:  Diagnosis Date  . Anxiety and depression   . Diabetes mellitus, type II ([place])    Dr. Dorris Fetch managing  . Frequent headaches chronic   ibuprofen helps some  . History of UTI    Recurrent per pt (approx 3 per year)  . Hyperlipidemia   . Hypertension   . Leukocytosis 12/2015   mild lymphocytosis.  Path smear review reassuring.  Repeat 01/2016 stable.  . Morbid obesity ([place])   . OSA on CPAP    Past Surgical History:  Procedure Laterality Date  . ABDOMINAL HYSTERECTOMY     Dysfunctional uterine bleeding.  No hx of abnormal paps.  . COLONOSCOPY  approx 2012   Pt states it was done at [facility] by a GI MD from [place].  Recall 10 yrs.   Social History   Social History  . Marital status: Widowed    Spouse name: N/A  . Number of children: N/A  . Years of education: N/A   Social History Main Topics  . Smoking status: Former Research scientist (life sciences)  . Smokeless tobacco: Never Used  . Alcohol use No  . Drug use: No  . Sexual activity: Not Asked   Other Topics Concern  . None   Social History Narrative   Separated x 20 yrs, one daughter.  She lives with her.  Great niece and nephew live with her as well.   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   Tob: 7 pack year hx, quit 2010.   Alc: none   Exercise: none   Outpatient Encounter Prescriptions as of 07/09/2016  Medication Sig  . blood glucose meter kit and supplies KIT Dispense based on patient and insurance preference. Use up to two times daily as directed. (FOR ICD-10 E11.65)  . dapagliflozin propanediol (FARXIGA) 10 MG TABS tablet Take 10 mg by mouth daily.  . DULoxetine (CYMBALTA) 30 MG capsule Take 1 capsule (30 mg total) by mouth daily.  . Insulin Detemir (LEVEMIR FLEXPEN) 100 UNIT/ML Pen Inject 40 Units into the skin at bedtime.  . Liraglutide (VICTOZA) 18 MG/3ML SOPN Inject 0.3 mLs (1.8 mg total) into the skin  daily.  . metFORMIN (GLUCOPHAGE) 1000 MG tablet Take 1 tablet (1,000 mg total) by mouth 2 (two) times daily.  . simvastatin (ZOCOR) 20 MG tablet Take 20 mg by mouth daily.  . Vitamin D, Ergocalciferol, (DRISDOL) 50000 units CAPS capsule Take 50,000 Units by mouth every 7 (seven) days.  . [DISCONTINUED] dapagliflozin propanediol (FARXIGA) 10 MG TABS tablet Take 10 mg by mouth daily.  . [DISCONTINUED] dapagliflozin propanediol (FARXIGA) 10 MG TABS tablet Take 10 mg by mouth daily.  . [DISCONTINUED] Insulin Detemir (LEVEMIR FLEXPEN) 100 UNIT/ML Pen Inject 40 Units into the skin at bedtime.  . [DISCONTINUED] metFORMIN (GLUCOPHAGE) 1000 MG tablet TAKE ONE TABLET BY MOUTH TWICE DAILY  . [DISCONTINUED] metFORMIN (GLUCOPHAGE) 1000 MG tablet Take 1 tablet (1,000 mg total) by mouth 2 (two) times daily.   No facility-administered encounter medications on file as of 07/09/2016.    ALLERGIES: No Known Allergies VACCINATION STATUS: Immunization History  Administered Date(s) Administered  . Tdap 02/17/2016    Diabetes  She presents for her follow-up diabetic visit. She has type 2 diabetes mellitus. Onset time: She was diagnosed at approximate age of 73 years. Her disease course has been worsening. There are no hypoglycemic associated symptoms. Pertinent negatives  for hypoglycemia include no confusion, headaches, pallor or seizures. Associated symptoms include fatigue, polydipsia, polyuria and visual change. Pertinent negatives for diabetes include no chest pain and no polyphagia. There are no hypoglycemic complications. Symptoms are worsening. There are no diabetic complications. Risk factors for coronary artery disease include diabetes mellitus, dyslipidemia, hypertension, obesity, sedentary lifestyle and tobacco exposure. Current diabetic treatments: She has not been consistent in taking her medications. She is compliant with treatment none of the time. Her weight is decreasing steadily. She is following a  generally unhealthy diet. She never participates in exercise. Home blood sugar record trend: She came with no meter nor logs. Her A1c is higher at >14% from  9.4%  last visit. An ACE inhibitor/angiotensin II receptor blocker is not being taken.  Hyperlipidemia  This is a chronic problem. The current episode started more than 1 year ago. Pertinent negatives include no chest pain, myalgias or shortness of breath. Current antihyperlipidemic treatment includes statins. Risk factors for coronary artery disease include a sedentary lifestyle, dyslipidemia and diabetes mellitus.  Hypertension  This is a chronic problem. The current episode started more than 1 year ago. Pertinent negatives include no chest pain, headaches, palpitations or shortness of breath. Risk factors for coronary artery disease include smoking/tobacco exposure, obesity, dyslipidemia, diabetes mellitus and sedentary lifestyle.     Review of Systems  Constitutional: Positive for fatigue. Negative for chills, fever and unexpected weight change.  HENT: Negative for trouble swallowing and voice change.   Eyes: Negative for visual disturbance.  Respiratory: Negative for cough, shortness of breath and wheezing.   Cardiovascular: Negative for chest pain, palpitations and leg swelling.  Gastrointestinal: Negative for diarrhea, nausea and vomiting.  Endocrine: Positive for polydipsia and polyuria. Negative for cold intolerance, heat intolerance and polyphagia.  Musculoskeletal: Negative for arthralgias and myalgias.  Skin: Negative for color change, pallor, rash and wound.  Neurological: Negative for seizures and headaches.  Psychiatric/Behavioral: Negative for confusion and suicidal ideas.    Objective:    BP (!) 170/83   Pulse 81   Ht 5' 5" (1.651 m)   Wt 204 lb (92.5 kg)   BMI 33.95 kg/m   Wt Readings from Last 3 Encounters:  07/09/16 204 lb (92.5 kg)  02/17/16 215 lb 12.8 oz (97.9 kg)  01/18/16 215 lb (97.5 kg)    Physical  Exam  Constitutional: She is oriented to person, place, and time. She appears well-developed.  HENT:  Head: Normocephalic and atraumatic.  Eyes: EOM are normal.  Neck: Normal range of motion. Neck supple. No tracheal deviation present. No thyromegaly present.  Cardiovascular: Normal rate and regular rhythm.   Pulmonary/Chest: Effort normal and breath sounds normal.  Abdominal: Soft. Bowel sounds are normal. There is no tenderness. There is no guarding.  Musculoskeletal: Normal range of motion. She exhibits no edema.  Neurological: She is alert and oriented to person, place, and time. She has normal reflexes. No cranial nerve deficit. Coordination normal.  Skin: Skin is warm and dry. No rash noted. No erythema. No pallor.  Psychiatric: She has a normal mood and affect. Judgment normal.     Recent Results (from the past 2160 hour(s))  Hemoglobin A1c     Status: Abnormal   Collection Time: 05/26/16 11:48 AM  Result Value Ref Range   Hgb A1c MFr Bld >14.0 (H) <5.7 %    Comment: Verified by repeat analysis.   For someone without known diabetes, a hemoglobin A1c value of 6.5% or greater indicates that they may  have diabetes and this should be confirmed with a follow-up test.   For someone with known diabetes, a value <7% indicates that their diabetes is well controlled and a value greater than or equal to 7% indicates suboptimal control. A1c targets should be individualized based on duration of diabetes, age, comorbid conditions, and other considerations.   Currently, no consensus exists for use of hemoglobin A1c for diagnosis of diabetes for children.      Mean Plasma Glucose SEE NOTE mg/dL    Comment: eAG cannot be calculated. Hemoglobin A1c result exceeds the linearity of the assay.     Comprehensive metabolic panel     Status: Abnormal   Collection Time: 05/26/16 11:50 AM  Result Value Ref Range   Sodium 138 135 - 146 mmol/L   Potassium 4.2 3.5 - 5.3 mmol/L   Chloride 103  98 - 110 mmol/L   CO2 23 20 - 31 mmol/L   Glucose, Bld 344 (H) 65 - 99 mg/dL   BUN 12 7 - 25 mg/dL   Creat 0.75 0.50 - 1.05 mg/dL    Comment:   For patients > or = 57 years of age: The upper reference limit for Creatinine is approximately 13% higher for people identified as African-American.      Total Bilirubin 0.4 0.2 - 1.2 mg/dL   Alkaline Phosphatase 81 33 - 130 U/L   AST 20 10 - 35 U/L   ALT 22 6 - 29 U/L   Total Protein 7.0 6.1 - 8.1 g/dL   Albumin 3.9 3.6 - 5.1 g/dL   Calcium 9.9 8.6 - 10.4 mg/dL      Assessment & Plan:   1. Uncontrolled type 2 diabetes mellitus with complication, without long-term current use of insulin ([place])   - Her diabetes is  complicated by noncompliance and nonadherence and patient remains at a high risk for more acute and chronic complications of diabetes which include CAD, CVA, CKD, retinopathy, and neuropathy. These are all discussed in detail with the patient.  - Unfortunately patient is alarmingly noncompliant. She missed her appointment since April 2017. She came with labs showing A1c greater than 14%, it was 9.4% last visit. She didn't bring any meter nor logs with her today. She claims she ran out of her medications for at least 2 months.  -She  displays unconcerned affect.  - I have re-counseled the patient on diet management and weight loss  by adopting a carbohydrate restricted / protein rich  Diet.  - Suggestion is made for patient to avoid simple carbohydrates   from their diet including Cakes , Desserts, Ice Cream,  Soda (  diet and regular) , Sweet Tea , Candies,  Chips, Cookies, Artificial Sweeteners,   and "Sugar-free" Products .  This will help patient to have stable blood glucose profile and potentially avoid unintended  Weight gain.  - Patient is advised to stick to a routine mealtimes to eat 3 meals  a day and avoid unnecessary snacks ( to snack only to correct hypoglycemia).  - The patient  has been  scheduled with Jearld Fenton, RDN, CDE for individualized DM education.  - I have approached patient with the following individualized plan to manage diabetes and patient agrees.  - I urged her to resume testing blood glucose 4 times a day and return in one week with her meter and logs, I will resume her Levemir 40 units daily at bedtime, hold Victoza for now. - I gave her a sample of  Tyler Aas to use until she gets her Levemir from [facility]. -If her readings are significantly above target, she will be approached for a brief period of basal/bolus insulin therapy.  -Patient is encouraged to call clinic for blood glucose levels less than 70 or above 300 mg /dl. - I will continue  metformin 1000 mg by mouth twice a day as well as  Farxiga 10 mg by mouth daily. Side effects and precautions discussed with her.  - Patient specific target  for A1c; LDL, HDL, Triglycerides, and  Waist Circumference were discussed in detail.  2) BP/HTN: uncontrolled . I will prescribe  Lisinopril/HCTZ for blood pressure control.  3) Lipids/HPL:  continue statins. 4)  Weight/Diet: She lost 20 pounds unintentionally since last visit, this is due to significant glycosuria related to severely uncontrolled hyper glycemia. CDE consult in progress, exercise, and carbohydrates information provided.  5) Chronic Care/Health Maintenance:  -Patient  is on  statin medications and encouraged to continue to follow up with Ophthalmology, Podiatrist at least yearly or according to recommendations, and advised to  stay away from smoking. I have recommended yearly flu vaccine and pneumonia vaccination at least every 5 years; moderate intensity exercise for up to 150 minutes weekly; and  sleep for at least 7 hours a day.  - 25 minutes of time was spent on the care of this patient , 50% of which was applied for counseling on diabetes complications and their preventions.  - I advised patient to maintain close follow up with Tammi Sou, MD for  primary care needs.  Patient is asked to bring meter and  blood glucose logs during their next visit.   Follow up plan: -Return in about 1 week (around 07/16/2016) for follow up with meter and logs- no labs.  Glade Lloyd, MD Phone: [phone]  Fax: [phone]   07/09/2016, 9:33 AM

## 2016-07-18 ENCOUNTER — Ambulatory Visit: Payer: BLUE CROSS/BLUE SHIELD | Admitting: "Endocrinology

## 2016-07-27 ENCOUNTER — Ambulatory Visit (INDEPENDENT_AMBULATORY_CARE_PROVIDER_SITE_OTHER): Payer: BLUE CROSS/BLUE SHIELD | Admitting: "Endocrinology

## 2016-07-27 ENCOUNTER — Encounter: Payer: Self-pay | Admitting: "Endocrinology

## 2016-07-27 VITALS — BP 137/76 | HR 93 | Ht 65.0 in | Wt 202.0 lb

## 2016-07-27 DIAGNOSIS — E782 Mixed hyperlipidemia: Secondary | ICD-10-CM | POA: Diagnosis not present

## 2016-07-27 DIAGNOSIS — I10 Essential (primary) hypertension: Secondary | ICD-10-CM

## 2016-07-27 DIAGNOSIS — E1165 Type 2 diabetes mellitus with hyperglycemia: Secondary | ICD-10-CM

## 2016-07-27 DIAGNOSIS — E118 Type 2 diabetes mellitus with unspecified complications: Secondary | ICD-10-CM | POA: Diagnosis not present

## 2016-07-27 DIAGNOSIS — Z9119 Patient's noncompliance with other medical treatment and regimen: Secondary | ICD-10-CM | POA: Diagnosis not present

## 2016-07-27 DIAGNOSIS — Z91199 Patient's noncompliance with other medical treatment and regimen due to unspecified reason: Secondary | ICD-10-CM

## 2016-07-27 DIAGNOSIS — IMO0002 Reserved for concepts with insufficient information to code with codable children: Secondary | ICD-10-CM

## 2016-07-27 MED ORDER — INSULIN DETEMIR 100 UNIT/ML FLEXPEN
50.0000 [IU] | PEN_INJECTOR | Freq: Every day | SUBCUTANEOUS | 0 refills | Status: DC
Start: 2016-07-27 — End: 2016-07-27

## 2016-07-27 NOTE — Patient Instructions (Signed)

## 2016-07-27 NOTE — Progress Notes (Signed)
Subjective:    Patient ID: Casey Johnston, female    DOB: 13-May-1961, PCP Tammi Sou, MD   Past Medical History:  Diagnosis Date  . Anxiety and depression   . Diabetes mellitus, type II ([place])    Dr. Dorris Fetch managing  . Frequent headaches chronic   ibuprofen helps some  . History of UTI    Recurrent per pt (approx 3 per year)  . Hyperlipidemia   . Hypertension   . Leukocytosis 12/2015   mild lymphocytosis.  Path smear review reassuring.  Repeat 01/2016 stable.  . Morbid obesity ([place])   . OSA on CPAP    Past Surgical History:  Procedure Laterality Date  . ABDOMINAL HYSTERECTOMY     Dysfunctional uterine bleeding.  No hx of abnormal paps.  . COLONOSCOPY  approx 2012   Pt states it was done at [facility] by a GI MD from [place].  Recall 10 yrs.   Social History   Social History  . Marital status: Widowed    Spouse name: N/A  . Number of children: N/A  . Years of education: N/A   Social History Main Topics  . Smoking status: Former Research scientist (life sciences)  . Smokeless tobacco: Never Used  . Alcohol use No  . Drug use: No  . Sexual activity: Not Asked   Other Topics Concern  . None   Social History Narrative   Separated x 20 yrs, one daughter.  She lives with her.  Great niece and nephew live with her as well.   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   Tob: 7 pack year hx, quit 2010.   Alc: none   Exercise: none   Outpatient Encounter Prescriptions as of 07/27/2016  Medication Sig  . Insulin Detemir (LEVEMIR FLEXTOUCH [place]) Inject 60 Units into the skin at bedtime.  . blood glucose meter kit and supplies KIT Dispense based on patient and insurance preference. Use up to two times daily as directed. (FOR ICD-10 E11.65)  . dapagliflozin propanediol (FARXIGA) 10 MG TABS tablet Take 10 mg by mouth daily.  . DULoxetine (CYMBALTA) 30 MG capsule Take 1 capsule (30 mg total) by mouth daily.  Marland Kitchen lisinopril-hydrochlorothiazide (PRINZIDE,ZESTORETIC) 20-25 MG tablet Take 1 tablet by mouth  daily.  . metFORMIN (GLUCOPHAGE) 1000 MG tablet Take 1 tablet (1,000 mg total) by mouth 2 (two) times daily.  . simvastatin (ZOCOR) 20 MG tablet Take 20 mg by mouth daily.  . Vitamin D, Ergocalciferol, (DRISDOL) 50000 units CAPS capsule Take 50,000 Units by mouth every 7 (seven) days.  . [DISCONTINUED] Insulin Detemir (LEVEMIR FLEXPEN) 100 UNIT/ML Pen Inject 40 Units into the skin at bedtime.  . [DISCONTINUED] Insulin Detemir (LEVEMIR FLEXPEN) 100 UNIT/ML Pen Inject 50 Units into the skin at bedtime.  . [DISCONTINUED] Liraglutide (VICTOZA) 18 MG/3ML SOPN Inject 0.3 mLs (1.8 mg total) into the skin daily.   No facility-administered encounter medications on file as of 07/27/2016.    ALLERGIES: No Known Allergies VACCINATION STATUS: Immunization History  Administered Date(s) Administered  . Tdap 02/17/2016    Diabetes  She presents for her follow-up diabetic visit. She has type 2 diabetes mellitus. Onset time: She was diagnosed at approximate age of 85 years. Her disease course has been worsening. There are no hypoglycemic associated symptoms. Pertinent negatives for hypoglycemia include no confusion, headaches, pallor or seizures. Associated symptoms include fatigue, polydipsia, polyuria and visual change. Pertinent negatives for diabetes include no chest pain and no polyphagia. There are no hypoglycemic complications. Symptoms are worsening.  There are no diabetic complications. Risk factors for coronary artery disease include diabetes mellitus, dyslipidemia, hypertension, obesity, sedentary lifestyle and tobacco exposure. Current diabetic treatments: She has not been consistent in taking her medications. She is compliant with treatment none of the time. Her weight is stable. She is following a generally unhealthy diet. She never participates in exercise. Her overall blood glucose range is >200 mg/dl. An ACE inhibitor/angiotensin II receptor blocker is not being taken.  Hyperlipidemia  This is a  chronic problem. The current episode started more than 1 year ago. Pertinent negatives include no chest pain, myalgias or shortness of breath. Current antihyperlipidemic treatment includes statins. Risk factors for coronary artery disease include a sedentary lifestyle, dyslipidemia and diabetes mellitus.  Hypertension  This is a chronic problem. The current episode started more than 1 year ago. Pertinent negatives include no chest pain, headaches, palpitations or shortness of breath. Risk factors for coronary artery disease include smoking/tobacco exposure, obesity, dyslipidemia, diabetes mellitus and sedentary lifestyle.     Review of Systems  Constitutional: Positive for fatigue. Negative for chills, fever and unexpected weight change.  HENT: Negative for trouble swallowing and voice change.   Eyes: Negative for visual disturbance.  Respiratory: Negative for cough, shortness of breath and wheezing.   Cardiovascular: Negative for chest pain, palpitations and leg swelling.  Gastrointestinal: Negative for diarrhea, nausea and vomiting.  Endocrine: Positive for polydipsia and polyuria. Negative for cold intolerance, heat intolerance and polyphagia.  Musculoskeletal: Negative for arthralgias and myalgias.  Skin: Negative for color change, pallor, rash and wound.  Neurological: Negative for seizures and headaches.  Psychiatric/Behavioral: Negative for confusion and suicidal ideas.    Objective:    BP 137/76   Pulse 93   Ht '5\' 5"'  (1.651 m)   Wt 202 lb (91.6 kg)   BMI 33.61 kg/m   Wt Readings from Last 3 Encounters:  07/27/16 202 lb (91.6 kg)  07/09/16 204 lb (92.5 kg)  02/17/16 215 lb 12.8 oz (97.9 kg)    Physical Exam  Constitutional: She is oriented to person, place, and time. She appears well-developed.  HENT:  Head: Normocephalic and atraumatic.  Eyes: EOM are normal.  Neck: Normal range of motion. Neck supple. No tracheal deviation present. No thyromegaly present.   Cardiovascular: Normal rate and regular rhythm.   Pulmonary/Chest: Effort normal and breath sounds normal.  Abdominal: Soft. Bowel sounds are normal. There is no tenderness. There is no guarding.  Musculoskeletal: Normal range of motion. She exhibits no edema.  Neurological: She is alert and oriented to person, place, and time. She has normal reflexes. No cranial nerve deficit. Coordination normal.  Skin: Skin is warm and dry. No rash noted. No erythema. No pallor.  Psychiatric: She has a normal mood and affect. Judgment normal.     Recent Results (from the past 2160 hour(s))  Hemoglobin A1c     Status: Abnormal   Collection Time: 05/26/16 11:48 AM  Result Value Ref Range   Hgb A1c MFr Bld >14.0 (H) <5.7 %    Comment: Verified by repeat analysis.   For someone without known diabetes, a hemoglobin A1c value of 6.5% or greater indicates that they may have diabetes and this should be confirmed with a follow-up test.   For someone with known diabetes, a value <7% indicates that their diabetes is well controlled and a value greater than or equal to 7% indicates suboptimal control. A1c targets should be individualized based on duration of diabetes, age, comorbid conditions, and other  considerations.   Currently, no consensus exists for use of hemoglobin A1c for diagnosis of diabetes for children.      Mean Plasma Glucose SEE NOTE mg/dL    Comment: eAG cannot be calculated. Hemoglobin A1c result exceeds the linearity of the assay.     Comprehensive metabolic panel     Status: Abnormal   Collection Time: 05/26/16 11:50 AM  Result Value Ref Range   Sodium 138 135 - 146 mmol/L   Potassium 4.2 3.5 - 5.3 mmol/L   Chloride 103 98 - 110 mmol/L   CO2 23 20 - 31 mmol/L   Glucose, Bld 344 (H) 65 - 99 mg/dL   BUN 12 7 - 25 mg/dL   Creat 0.75 0.50 - 1.05 mg/dL    Comment:   For patients > or = 57 years of age: The upper reference limit for Creatinine is approximately 13% higher for  people identified as African-American.      Total Bilirubin 0.4 0.2 - 1.2 mg/dL   Alkaline Phosphatase 81 33 - 130 U/L   AST 20 10 - 35 U/L   ALT 22 6 - 29 U/L   Total Protein 7.0 6.1 - 8.1 g/dL   Albumin 3.9 3.6 - 5.1 g/dL   Calcium 9.9 8.6 - 10.4 mg/dL      Assessment & Plan:   1. Uncontrolled type 2 diabetes mellitus with complication, without long-term current use of insulin ([place])   - Her diabetes is  complicated by noncompliance and nonadherence and patient remains at a high risk for more acute and chronic complications of diabetes which include CAD, CVA, CKD, retinopathy, and neuropathy. These are all discussed in detail with the patient.  -  Patient Has a history of alarmingly noncompliance.  - She habitually misses her appointments. She recently showed up after long absence with A1c greater than 14%. -She  displays unconcerned affect.  - I have re-counseled the patient on diet management and weight loss  by adopting a carbohydrate restricted / protein rich  Diet.  - Suggestion is made for patient to avoid simple carbohydrates   from their diet including Cakes , Desserts, Ice Cream,  Soda (  diet and regular) , Sweet Tea , Candies,  Chips, Cookies, Artificial Sweeteners,   and "Sugar-free" Products .  This will help patient to have stable blood glucose profile and potentially avoid unintended  Weight gain.  - Patient is advised to stick to a routine mealtimes to eat 3 meals  a day and avoid unnecessary snacks ( to snack only to correct hypoglycemia).  - The patient  has been  scheduled with Jearld Fenton, RDN, CDE for individualized DM education.  - I have approached patient with the following individualized plan to manage diabetes and patient agrees.  - She will likely require basal/bolus insulin to treat her diabetes to target however she is not showing appropriate engagement. -I will continue to maximize her basal insulin at least until her next labs in visit. - I  advised her to increase her Levemir to 60 units daily at bedtime associated with monitoring of blood glucose daily before breakfast and at bedtime. - She will continue to hold Victoza for now.  -If her readings are significantly above target, she will be approached for a brief period of basal/bolus insulin therapy.  -Patient is encouraged to call clinic for blood glucose levels less than 70 or above 300 mg /dl. - I will continue  metformin 1000 mg by mouth twice a  day as well as  Farxiga 10 mg by mouth daily. Side effects and precautions discussed with her.  - Patient specific target  for A1c; LDL, HDL, Triglycerides, and  Waist Circumference were discussed in detail.  2) BP/HTN: uncontrolled . I will prescribe  Lisinopril/HCTZ for blood pressure control.  3) Lipids/HPL:  continue statins. 4)  Weight/Diet: She lost 20 pounds unintentionally since last visit, this is due to significant glycosuria related to severely uncontrolled hyper glycemia. CDE consult in progress, exercise, and carbohydrates information provided.  5) Chronic Care/Health Maintenance:  -Patient  is on  statin medications and encouraged to continue to follow up with Ophthalmology, Podiatrist at least yearly or according to recommendations, and advised to  quit smoking. I have recommended yearly flu vaccine and pneumonia vaccination at least every 5 years; moderate intensity exercise for up to 150 minutes weekly; and  sleep for at least 7 hours a day.  - 25 minutes of time was spent on the care of this patient , 50% of which was applied for counseling on diabetes complications and their preventions.  - I advised patient to maintain close follow up with Tammi Sou, MD for primary care needs.  Patient is asked to bring meter and  blood glucose logs during their next visit.   Follow up plan: -Return in about 6 weeks (around 09/07/2016) for follow up with pre-visit labs, meter, and logs.  Glade Lloyd, MD Phone:  [phone]  Fax: [phone]   07/27/2016, 11:52 AM

## 2016-08-27 ENCOUNTER — Other Ambulatory Visit: Payer: Self-pay | Admitting: "Endocrinology

## 2016-08-28 LAB — COMPREHENSIVE METABOLIC PANEL
ALBUMIN: 4 g/dL (ref 3.6–5.1)
ALK PHOS: 70 U/L (ref 33–130)
ALT: 20 U/L (ref 6–29)
AST: 24 U/L (ref 10–35)
BUN: 8 mg/dL (ref 7–25)
CALCIUM: 9.2 mg/dL (ref 8.6–10.4)
CO2: 26 mmol/L (ref 20–31)
Chloride: 103 mmol/L (ref 98–110)
Creat: 0.66 mg/dL (ref 0.50–1.05)
Glucose, Bld: 110 mg/dL — ABNORMAL HIGH (ref 65–99)
POTASSIUM: 3.9 mmol/L (ref 3.5–5.3)
Sodium: 142 mmol/L (ref 135–146)
Total Bilirubin: 0.3 mg/dL (ref 0.2–1.2)
Total Protein: 7 g/dL (ref 6.1–8.1)

## 2016-08-28 LAB — HEMOGLOBIN A1C
Hgb A1c MFr Bld: 11.3 % — ABNORMAL HIGH (ref <5.7)
MEAN PLASMA GLUCOSE: 278 mg/dL

## 2016-08-29 LAB — HM MAMMOGRAPHY

## 2016-08-31 ENCOUNTER — Encounter: Payer: Self-pay | Admitting: Family Medicine

## 2016-09-10 ENCOUNTER — Ambulatory Visit: Payer: BLUE CROSS/BLUE SHIELD | Admitting: "Endocrinology

## 2016-09-20 ENCOUNTER — Other Ambulatory Visit: Payer: Self-pay | Admitting: "Endocrinology

## 2016-10-08 ENCOUNTER — Ambulatory Visit: Payer: BLUE CROSS/BLUE SHIELD | Admitting: "Endocrinology

## 2018-01-08 ENCOUNTER — Ambulatory Visit: Payer: Self-pay | Admitting: Physician Assistant

## 2018-01-08 ENCOUNTER — Encounter: Payer: Self-pay | Admitting: Physician Assistant

## 2018-01-08 ENCOUNTER — Other Ambulatory Visit (HOSPITAL_COMMUNITY)
Admission: RE | Admit: 2018-01-08 | Discharge: 2018-01-08 | Disposition: A | Discharge status: Home / Self Care | Payer: BLUE CROSS/BLUE SHIELD | Source: Ambulatory Visit | Attending: Physician Assistant | Admitting: Physician Assistant

## 2018-01-08 VITALS — BP 160/74 | HR 93 | Temp 97.5°F | Ht 64.5 in | Wt 186.0 lb

## 2018-01-08 DIAGNOSIS — Z7689 Persons encountering health services in other specified circumstances: Secondary | ICD-10-CM

## 2018-01-08 DIAGNOSIS — F17200 Nicotine dependence, unspecified, uncomplicated: Secondary | ICD-10-CM

## 2018-01-08 DIAGNOSIS — I10 Essential (primary) hypertension: Secondary | ICD-10-CM

## 2018-01-08 DIAGNOSIS — E785 Hyperlipidemia, unspecified: Secondary | ICD-10-CM

## 2018-01-08 DIAGNOSIS — R69 Illness, unspecified: Secondary | ICD-10-CM | POA: Insufficient documentation

## 2018-01-08 DIAGNOSIS — D72829 Elevated white blood cell count, unspecified: Secondary | ICD-10-CM

## 2018-01-08 DIAGNOSIS — E119 Type 2 diabetes mellitus without complications: Secondary | ICD-10-CM

## 2018-01-08 DIAGNOSIS — E669 Obesity, unspecified: Secondary | ICD-10-CM

## 2018-01-08 LAB — CBC WITH DIFFERENTIAL/PLATELET
BASOS ABS: 0.1 10*3/uL (ref 0.0–0.1)
BASOS PCT: 1 %
Eosinophils Absolute: 0.1 10*3/uL (ref 0.0–0.7)
Eosinophils Relative: 1 %
HEMATOCRIT: 39.1 % (ref 36.0–46.0)
Hemoglobin: 13.7 g/dL (ref 12.0–15.0)
Lymphocytes Relative: 46 %
Lymphs Abs: 4.7 10*3/uL — ABNORMAL HIGH (ref 0.7–4.0)
MCH: 31.1 pg (ref 26.0–34.0)
MCHC: 35 g/dL (ref 30.0–36.0)
MCV: 88.7 fL (ref 78.0–100.0)
MONO ABS: 0.3 10*3/uL (ref 0.1–1.0)
Monocytes Relative: 3 %
NEUTROS ABS: 5.1 10*3/uL (ref 1.7–7.7)
Neutrophils Relative %: 49 %
Platelets: 213 10*3/uL (ref 150–400)
RBC: 4.41 MIL/uL (ref 3.87–5.11)
RDW: 12.8 % (ref 11.5–15.5)
WBC: 10.1 10*3/uL (ref 4.0–10.5)

## 2018-01-08 LAB — COMPREHENSIVE METABOLIC PANEL
ALBUMIN: 3.6 g/dL (ref 3.5–5.0)
ALT: 19 U/L (ref 0–44)
AST: 18 U/L (ref 15–41)
Alkaline Phosphatase: 101 U/L (ref 38–126)
Anion gap: 10 (ref 5–15)
BILIRUBIN TOTAL: 0.7 mg/dL (ref 0.3–1.2)
BUN: 7 mg/dL (ref 6–20)
CHLORIDE: 100 mmol/L (ref 98–111)
CO2: 26 mmol/L (ref 22–32)
Calcium: 9.5 mg/dL (ref 8.9–10.3)
Creatinine, Ser: 0.57 mg/dL (ref 0.44–1.00)
GFR calc Af Amer: >60 mL/min (ref >60)
GFR calc non Af Amer: >60 mL/min (ref >60)
Glucose, Bld: 376 mg/dL — ABNORMAL HIGH (ref 70–99)
POTASSIUM: 3.9 mmol/L (ref 3.5–5.1)
Sodium: 136 mmol/L (ref 135–145)
Total Protein: 7.6 g/dL (ref 6.5–8.1)

## 2018-01-08 LAB — HEMOGLOBIN A1C
Hgb A1c MFr Bld: 15 % — ABNORMAL HIGH (ref 4.8–5.6)
MEAN PLASMA GLUCOSE: 383.8 mg/dL

## 2018-01-08 LAB — LIPID PANEL
CHOL/HDL RATIO: 8.2 ratio
Cholesterol: 303 mg/dL — ABNORMAL HIGH (ref 0–200)
HDL: 37 mg/dL — ABNORMAL LOW (ref >40)
LDL CALC: UNDETERMINED mg/dL (ref 0–99)
Triglycerides: 503 mg/dL — ABNORMAL HIGH (ref <150)
VLDL: UNDETERMINED mg/dL (ref 0–40)

## 2018-01-08 LAB — GLUCOSE, POCT (MANUAL RESULT ENTRY): POC Glucose: 357 mg/dl — AB (ref 70–99)

## 2018-01-08 MED ORDER — LISINOPRIL-HYDROCHLOROTHIAZIDE 20-25 MG PO TABS: 1.0000 | ORAL_TABLET | Freq: Every day | ORAL | 1 refills | Status: DC

## 2018-01-08 MED ORDER — METFORMIN HCL 1000 MG PO TABS: 1000.0000 mg | ORAL_TABLET | Freq: Two times a day (BID) | ORAL | 1 refills | Status: DC

## 2018-01-08 NOTE — Progress Notes (Signed)
BP (!) 160/74 (BP Location: Left Arm, Patient Position: Sitting, Cuff Size: Normal)   Pulse 93   Temp (!) 97.5 F (36.4 C)   Ht 5' 4.5" (1.638 m)   Wt 186 lb (84.4 kg)   SpO2 97%   BMI 31.43 kg/m    Subjective:    Patient ID: Casey Johnston, female    DOB: 1960/06/26, 57 y.o.   MRN: [id number]  HPI: Casey Johnston is a 57 y.o. female presenting on 01/08/2018 for New Patient (Initial Visit) (previous pt of Dr. Dorris Fetch but hasn't seen him in about a year. previous pcp in [place] last seen about a year. pt hasn't taken HTN meds or DM meds in about a year.) and Dizziness (pt c/o dizziness when standing)   HPI   Chief Complaint  Patient presents with  . New Patient (Initial Visit)    previous pt of Dr. Dorris Fetch but hasn't seen him in about a year. previous pcp in [place] last seen about a year. pt hasn't taken HTN meds or DM meds in about a year.  . Dizziness    pt c/o dizziness when standing   Pt lives with her daughter  Pt says she had a colonoscopy at [facility] and she doesn't remember when it was done.   She says she recently started smoking again.  Relevant past medical, surgical, family and social history reviewed and updated as indicated. Interim medical history since our last visit reviewed. Allergies and medications reviewed and updated.  CURRENT MEDS: OTC sleep aid  Review of Systems  Constitutional: Positive for fatigue. Negative for appetite change, chills, diaphoresis, fever and unexpected weight change.  HENT: Negative for congestion, dental problem, drooling, ear pain, facial swelling, hearing loss, mouth sores, sneezing, sore throat, trouble swallowing and voice change.   Eyes: Positive for redness and itching. Negative for pain, discharge and visual disturbance.  Respiratory: Negative for cough, choking, shortness of breath and wheezing.   Cardiovascular: Negative for chest pain, palpitations and leg swelling.  Gastrointestinal: Positive for abdominal pain.  Negative for blood in stool, constipation, diarrhea and vomiting.  Endocrine: Positive for polydipsia. Negative for cold intolerance and heat intolerance.  Genitourinary: Negative for decreased urine volume, dysuria and hematuria.  Musculoskeletal: Negative for arthralgias, back pain and gait problem.  Skin: Negative for rash.  Allergic/Immunologic: Negative for environmental allergies.  Neurological: Negative for seizures, syncope, light-headedness and headaches.  Hematological: Negative for adenopathy.  Psychiatric/Behavioral: Negative for agitation, dysphoric mood and suicidal ideas. The patient is not nervous/anxious.     Per HPI unless specifically indicated above     Objective:    BP (!) 160/74 (BP Location: Left Arm, Patient Position: Sitting, Cuff Size: Normal)   Pulse 93   Temp (!) 97.5 F (36.4 C)   Ht 5' 4.5" (1.638 m)   Wt 186 lb (84.4 kg)   SpO2 97%   BMI 31.43 kg/m   Wt Readings from Last 3 Encounters:  01/08/18 186 lb (84.4 kg)  07/27/16 202 lb (91.6 kg)  07/09/16 204 lb (92.5 kg)    Physical Exam  Constitutional: She is oriented to person, place, and time. She appears well-developed and well-nourished.  HENT:  Head: Normocephalic and atraumatic.  Mouth/Throat: Oropharynx is clear and moist. No oropharyngeal exudate.  Eyes: Pupils are equal, round, and reactive to light. Conjunctivae and EOM are normal.  Neck: Neck supple. No thyromegaly present.  Cardiovascular: Normal rate and regular rhythm.  Pulmonary/Chest: Effort normal and breath sounds normal.  Abdominal: Soft. Bowel sounds are normal. She exhibits no mass. There is no hepatosplenomegaly. There is no tenderness.  Musculoskeletal: She exhibits no edema.  Lymphadenopathy:    She has no cervical adenopathy.  Neurological: She is alert and oriented to person, place, and time. Gait normal.  Skin: Skin is warm and dry.  Psychiatric: She has a normal mood and affect. Her behavior is normal.  Vitals  reviewed.   Results for orders placed or performed in visit on 01/08/18  POCT Glucose (CBG)  Result Value Ref Range   POC Glucose 357 (A) 70 - 99 mg/dl      Assessment & Plan:    Encounter Diagnoses  Name Primary?  . Encounter to establish care Yes  . Diabetes mellitus without complication ([place])   . Hyperlipidemia, unspecified hyperlipidemia type   . Essential hypertension, benign   . Leukocytosis, unspecified type   . Tobacco use disorder   . Obesity, unspecified classification, unspecified obesity type, unspecified whether serious comorbidity present     -rx Lisinopril/hctz for blood pressure -rx Metformin and gave januvia samples for diabetes.  Pt also to take lantus 10units qhs for diabetes.  Pt is counseled to monitor her bs.  She it to call office for fbs < 70 or > 300.   -pt was signed up for [facility] -Sent request for colonoscopy report to [facility] -Pt to Follow up 4 weeks with bs log.  RTO sooner prn

## 2018-01-09 ENCOUNTER — Other Ambulatory Visit: Payer: Self-pay | Admitting: Physician Assistant

## 2018-01-09 LAB — MICROALBUMIN, URINE, 24 HOUR
Microalb, 24H Ur: 0.8 mg/d (ref <30.0)
Microalb, Ur: 30.6 ug/mL — ABNORMAL HIGH
Total Volume: 25

## 2018-01-09 MED ORDER — SITAGLIPTIN PHOSPHATE 100 MG PO TABS: 100.0000 mg | ORAL_TABLET | Freq: Every day | ORAL | 1 refills | Status: DC

## 2018-01-13 ENCOUNTER — Encounter: Payer: Self-pay | Admitting: Physician Assistant

## 2018-01-13 NOTE — Progress Notes (Signed)
Pt came to the office and notified that her blood sugars have been over 300 for the past 3 days. Pt states she has been doing 10 units of Lantus at bedtime. Pt states this morning her blood sugar was over 300 and pt decided to give herself her insulin this morning after injecting insulin blood sugar went down to 287.  PA advised for pt to increase her insulin to 15 units at bedtime.  Pt was notified and emphasized on importance of using Lantus at bedtime rather than in the morning. Pt verbalized understanding. Pt was advised to call the office if sugars were less than 70 or more than 300. Pt verbalized understanding.

## 2018-02-03 ENCOUNTER — Encounter: Payer: Self-pay | Admitting: Physician Assistant

## 2018-02-05 ENCOUNTER — Ambulatory Visit: Payer: Self-pay | Admitting: Physician Assistant

## 2018-02-05 ENCOUNTER — Encounter: Payer: Self-pay | Admitting: Physician Assistant

## 2018-02-05 VITALS — BP 137/76 | HR 100 | Temp 98.1°F | Ht 64.5 in | Wt 185.2 lb

## 2018-02-05 DIAGNOSIS — E785 Hyperlipidemia, unspecified: Secondary | ICD-10-CM

## 2018-02-05 DIAGNOSIS — R109 Unspecified abdominal pain: Secondary | ICD-10-CM

## 2018-02-05 DIAGNOSIS — F17200 Nicotine dependence, unspecified, uncomplicated: Secondary | ICD-10-CM

## 2018-02-05 DIAGNOSIS — E1165 Type 2 diabetes mellitus with hyperglycemia: Secondary | ICD-10-CM

## 2018-02-05 DIAGNOSIS — E669 Obesity, unspecified: Secondary | ICD-10-CM

## 2018-02-05 DIAGNOSIS — I10 Essential (primary) hypertension: Secondary | ICD-10-CM

## 2018-02-05 MED ORDER — ATORVASTATIN CALCIUM 20 MG PO TABS: 20.0000 mg | ORAL_TABLET | Freq: Every day | ORAL | 2 refills | Status: DC

## 2018-02-05 NOTE — Patient Instructions (Signed)

## 2018-02-05 NOTE — Progress Notes (Signed)
BP 137/76 (BP Location: Left Arm, Patient Position: Sitting, Cuff Size: Normal)   Pulse 100   Temp 98.1 F (36.7 C)   Ht 5' 4.5" (1.638 m)   Wt 185 lb 4 oz (84 kg)   SpO2 95%   BMI 31.31 kg/m    Subjective:    Patient ID: Casey Johnston, female    DOB: Apr 08, 1961, 57 y.o.   MRN: [id number]  HPI: Casey Johnston is a 57 y.o. female presenting on 02/05/2018 for Diabetes and Hypertension   HPI   Pt has gotten her meds from [facility].   She has her bs log- past week range 258-189  Pt Started in mid July with  pain LUQ pain- unaffected by food.   It randomly comes and goes but mostly stays.  Sometimes the pain radiates to the R side and sometimes to the back.  When she eats, she will often have to go to the bathroom (to move her bowels).  often it is diarrhea and sometimes it isn't.  No blood in stool.  No emesis.       Relevant past medical, surgical, family and social history reviewed and updated as indicated. Interim medical history since our last visit reviewed. Allergies and medications reviewed and updated.   Current Outpatient Medications:  .  Doxylamine Succinate, Sleep, (SLEEP AID PO), Take 2 tablets by mouth at bedtime., Disp: , Rfl:  .  insulin glargine (LANTUS) 100 UNIT/ML injection, Inject 30 Units into the skin at bedtime., Disp: , Rfl:  .  lisinopril-hydrochlorothiazide (PRINZIDE,ZESTORETIC) 20-25 MG tablet, Take 1 tablet by mouth daily., Disp: 30 tablet, Rfl: 1 .  metFORMIN (GLUCOPHAGE) 1000 MG tablet, Take 1 tablet (1,000 mg total) by mouth 2 (two) times daily with a meal., Disp: 60 tablet, Rfl: 1 .  sitaGLIPtin (JANUVIA) 100 MG tablet, Take 1 tablet (100 mg total) by mouth daily., Disp: 90 tablet, Rfl: 1   Review of Systems  Constitutional: Positive for fatigue. Negative for appetite change, chills, diaphoresis, fever and unexpected weight change.  HENT: Negative for congestion, dental problem, drooling, ear pain, facial swelling, hearing loss, mouth sores,  sneezing, sore throat, trouble swallowing and voice change.   Eyes: Positive for redness and itching. Negative for pain, discharge and visual disturbance.  Respiratory: Positive for cough. Negative for choking, shortness of breath and wheezing.   Cardiovascular: Negative for chest pain, palpitations and leg swelling.  Gastrointestinal: Positive for abdominal pain, constipation and diarrhea. Negative for blood in stool and vomiting.  Endocrine: Negative for cold intolerance, heat intolerance and polydipsia.  Genitourinary: Negative for decreased urine volume, dysuria and hematuria.  Musculoskeletal: Positive for back pain. Negative for arthralgias and gait problem.  Skin: Negative for rash.  Allergic/Immunologic: Negative for environmental allergies.  Neurological: Negative for seizures, syncope, light-headedness and headaches.  Hematological: Negative for adenopathy.  Psychiatric/Behavioral: Negative for agitation, dysphoric mood and suicidal ideas. The patient is not nervous/anxious.     Per HPI unless specifically indicated above     Objective:    BP 137/76 (BP Location: Left Arm, Patient Position: Sitting, Cuff Size: Normal)   Pulse 100   Temp 98.1 F (36.7 C)   Ht 5' 4.5" (1.638 m)   Wt 185 lb 4 oz (84 kg)   SpO2 95%   BMI 31.31 kg/m   Wt Readings from Last 3 Encounters:  02/05/18 185 lb 4 oz (84 kg)  01/08/18 186 lb (84.4 kg)  07/27/16 202 lb (91.6 kg)    Physical  Exam  Constitutional: She is oriented to person, place, and time. She appears well-developed and well-nourished.  HENT:  Head: Normocephalic and atraumatic.  Neck: Neck supple.  Cardiovascular: Normal rate and regular rhythm.  Pulmonary/Chest: Effort normal and breath sounds normal.  Abdominal: Soft. Bowel sounds are normal. She exhibits no mass. There is no hepatosplenomegaly. There is no tenderness.  Musculoskeletal: She exhibits no edema.  Lymphadenopathy:    She has no cervical adenopathy.   Neurological: She is alert and oriented to person, place, and time.  Skin: Skin is warm and dry.  Psychiatric: She has a normal mood and affect. Her behavior is normal.  Vitals reviewed.   Results for orders placed or performed during the hospital encounter of 01/08/18  CBC with Differential/Platelet  Result Value Ref Range   WBC 10.1 4.0 - 10.5 K/uL   RBC 4.41 3.87 - 5.11 MIL/uL   Hemoglobin 13.7 12.0 - 15.0 g/dL   HCT 39.1 36.0 - 46.0 %   MCV 88.7 78.0 - 100.0 fL   MCH 31.1 26.0 - 34.0 pg   MCHC 35.0 30.0 - 36.0 g/dL   RDW 12.8 11.5 - 15.5 %   Platelets 213 150 - 400 K/uL   Neutrophils Relative % 49 %   Neutro Abs 5.1 1.7 - 7.7 K/uL   Lymphocytes Relative 46 %   Lymphs Abs 4.7 (H) 0.7 - 4.0 K/uL   Monocytes Relative 3 %   Monocytes Absolute 0.3 0.1 - 1.0 K/uL   Eosinophils Relative 1 %   Eosinophils Absolute 0.1 0.0 - 0.7 K/uL   Basophils Relative 1 %   Basophils Absolute 0.1 0.0 - 0.1 K/uL  Comprehensive metabolic panel  Result Value Ref Range   Sodium 136 135 - 145 mmol/L   Potassium 3.9 3.5 - 5.1 mmol/L   Chloride 100 98 - 111 mmol/L   CO2 26 22 - 32 mmol/L   Glucose, Bld 376 (H) 70 - 99 mg/dL   BUN 7 6 - 20 mg/dL   Creatinine, Ser 0.57 0.44 - 1.00 mg/dL   Calcium 9.5 8.9 - 10.3 mg/dL   Total Protein 7.6 6.5 - 8.1 g/dL   Albumin 3.6 3.5 - 5.0 g/dL   AST 18 15 - 41 U/L   ALT 19 0 - 44 U/L   Alkaline Phosphatase 101 38 - 126 U/L   Total Bilirubin 0.7 0.3 - 1.2 mg/dL   GFR calc non Af Amer >60 >60 mL/min   GFR calc Af Amer >60 >60 mL/min   Anion gap 10 5 - 15  Lipid panel  Result Value Ref Range   Cholesterol 303 (H) 0 - 200 mg/dL   Triglycerides 503 (H) <150 mg/dL   HDL 37 (L) >40 mg/dL   Total CHOL/HDL Ratio 8.2 RATIO   VLDL UNABLE TO CALCULATE IF TRIGLYCERIDE OVER 400 mg/dL 0 - 40 mg/dL   LDL Cholesterol UNABLE TO CALCULATE IF TRIGLYCERIDE OVER 400 mg/dL 0 - 99 mg/dL  Microalbumin, urine, 24 hour  Result Value Ref Range   Microalb, Ur 30.6 (H) Not  Estab. ug/mL   Microalb, 24H Ur 0.8 <30.0 mg/day   Total Volume 25   Hemoglobin A1c  Result Value Ref Range   Hgb A1c MFr Bld 15.0 (H) 4.8 - 5.6 %   Mean Plasma Glucose 383.8 mg/dL      Assessment & Plan:   Encounter Diagnoses  Name Primary?  Marland Kitchen Uncontrolled type 2 diabetes mellitus with hyperglycemia ([place]) Yes  . Abdominal pain, unspecified  abdominal location   . Hyperlipidemia, unspecified hyperlipidemia type   . Essential hypertension, benign   . Tobacco use disorder   . Obesity, unspecified classification, unspecified obesity type, unspecified whether serious comorbidity present     -Reviewed lab results with pt  -Increase lantus to 35 units.  If after a week her a.m. fbs is still running over 150, increase to 40 units.  discontinue the januvia in light of triglycerides.  -rx Atorvastatin and fish oil   -pt was given Give [facility] [employer] application  -Refer to GI - colonoscopy 2012 with polyps (done by Dr Earley Brooke)  -No funding for free screening mammogram will readdress next month.   -pt to follow up 3 weeks with bs log.  RTO sooner prn

## 2018-02-10 ENCOUNTER — Ambulatory Visit (HOSPITAL_COMMUNITY)
Admission: RE | Admit: 2018-02-10 | Discharge: 2018-02-10 | Disposition: A | Discharge status: Home / Self Care | Payer: Self-pay | Source: Ambulatory Visit | Attending: Physician Assistant | Admitting: Physician Assistant

## 2018-02-10 ENCOUNTER — Telehealth: Payer: Self-pay | Admitting: Student

## 2018-02-10 DIAGNOSIS — R109 Unspecified abdominal pain: Secondary | ICD-10-CM | POA: Insufficient documentation

## 2018-02-10 NOTE — Telephone Encounter (Signed)
Called and spoke with patient to notify her to stop [place] due to high triglycerides. Pt was also instructed to increase Lantus to 40 units (pt currently on 30 units). Pt verbalized understanding.

## 2018-02-25 ENCOUNTER — Encounter: Payer: Self-pay | Admitting: Physician Assistant

## 2018-02-25 ENCOUNTER — Ambulatory Visit: Payer: Self-pay | Admitting: Physician Assistant

## 2018-02-25 VITALS — BP 142/60 | HR 98 | Temp 97.3°F | Ht 64.5 in | Wt 181.0 lb

## 2018-02-25 DIAGNOSIS — Z860100 Personal history of colon polyps, unspecified: Secondary | ICD-10-CM

## 2018-02-25 DIAGNOSIS — F17200 Nicotine dependence, unspecified, uncomplicated: Secondary | ICD-10-CM

## 2018-02-25 DIAGNOSIS — Z1239 Encounter for other screening for malignant neoplasm of breast: Secondary | ICD-10-CM

## 2018-02-25 DIAGNOSIS — R109 Unspecified abdominal pain: Secondary | ICD-10-CM

## 2018-02-25 DIAGNOSIS — I10 Essential (primary) hypertension: Secondary | ICD-10-CM

## 2018-02-25 DIAGNOSIS — E1165 Type 2 diabetes mellitus with hyperglycemia: Secondary | ICD-10-CM

## 2018-02-25 DIAGNOSIS — E785 Hyperlipidemia, unspecified: Secondary | ICD-10-CM

## 2018-02-25 MED ORDER — AMLODIPINE BESYLATE 5 MG PO TABS: 5.0000 mg | ORAL_TABLET | Freq: Every day | ORAL | 1 refills | Status: DC

## 2018-02-25 NOTE — Progress Notes (Signed)
BP (!) 142/60 (BP Location: Left Arm, Patient Position: Sitting, Cuff Size: Normal)   Pulse 98   Temp (!) 97.3 F (36.3 C)   Ht 5' 4.5" (1.638 m)   Wt 181 lb (82.1 kg)   SpO2 97%   BMI 30.59 kg/m    Subjective:    Patient ID: Casey Johnston, female    DOB: Jul 30, 1960, 57 y.o.   MRN: [id number]  HPI: Casey Johnston is a 57 y.o. female presenting on 02/25/2018 for Diabetes; Abdominal Pain; and Hypertension   HPI   Still having abd pain that moves around.  No diarrhea.   No emesis.  Plain film xray done last month unremarkable.   Pt is going today to submit [facility] [employer] application  bs log [phone] with currently using 40 units lantus  Relevant past medical, surgical, family and social history reviewed and updated as indicated. Interim medical history since our last visit reviewed. Allergies and medications reviewed and updated.   Current Outpatient Medications:  .  atorvastatin (LIPITOR) 20 MG tablet, Take 1 tablet (20 mg total) by mouth daily., Disp: 90 tablet, Rfl: 2 .  Doxylamine Succinate, Sleep, (SLEEP AID PO), Take 2 tablets by mouth at bedtime., Disp: , Rfl:  .  insulin glargine (LANTUS) 100 UNIT/ML injection, Inject 40 Units into the skin at bedtime. , Disp: , Rfl:  .  lisinopril-hydrochlorothiazide (PRINZIDE,ZESTORETIC) 20-25 MG tablet, Take 1 tablet by mouth daily., Disp: 30 tablet, Rfl: 1 .  metFORMIN (GLUCOPHAGE) 1000 MG tablet, Take 1 tablet (1,000 mg total) by mouth 2 (two) times daily with a meal., Disp: 60 tablet, Rfl: 1 .  Omega-3 Fatty Acids (FISH OIL PO), Take by mouth., Disp: , Rfl:    Review of Systems  Constitutional: Positive for fatigue. Negative for appetite change, chills, diaphoresis, fever and unexpected weight change.  HENT: Negative for congestion, dental problem, drooling, ear pain, facial swelling, hearing loss, mouth sores, sneezing, sore throat, trouble swallowing and voice change.   Eyes: Negative for pain, discharge, redness,  itching and visual disturbance.  Respiratory: Negative for cough, choking, shortness of breath and wheezing.   Cardiovascular: Negative for chest pain, palpitations and leg swelling.  Gastrointestinal: Positive for abdominal pain and constipation. Negative for blood in stool, diarrhea and vomiting.  Endocrine: Negative for cold intolerance, heat intolerance and polydipsia.  Genitourinary: Negative for decreased urine volume, dysuria and hematuria.  Musculoskeletal: Negative for arthralgias, back pain and gait problem.  Skin: Negative for rash.  Allergic/Immunologic: Negative for environmental allergies.  Neurological: Negative for seizures, syncope, light-headedness and headaches.  Hematological: Negative for adenopathy.  Psychiatric/Behavioral: Negative for agitation, dysphoric mood and suicidal ideas. The patient is not nervous/anxious.     Per HPI unless specifically indicated above     Objective:    BP (!) 142/60 (BP Location: Left Arm, Patient Position: Sitting, Cuff Size: Normal)   Pulse 98   Temp (!) 97.3 F (36.3 C)   Ht 5' 4.5" (1.638 m)   Wt 181 lb (82.1 kg)   SpO2 97%   BMI 30.59 kg/m   Wt Readings from Last 3 Encounters:  02/25/18 181 lb (82.1 kg)  02/05/18 185 lb 4 oz (84 kg)  01/08/18 186 lb (84.4 kg)    Physical Exam  Constitutional: She is oriented to person, place, and time. She appears well-developed and well-nourished.  HENT:  Head: Normocephalic and atraumatic.  Neck: Neck supple.  Cardiovascular: Normal rate and regular rhythm.  Pulmonary/Chest: Effort normal and breath  sounds normal.  Abdominal: Soft. Bowel sounds are normal. She exhibits no mass. There is no hepatosplenomegaly. There is no tenderness. There is no rigidity.  Musculoskeletal: She exhibits no edema.  Lymphadenopathy:    She has no cervical adenopathy.  Neurological: She is alert and oriented to person, place, and time.  Skin: Skin is warm and dry.  Psychiatric: She has a normal mood  and affect. Her behavior is normal.  Vitals reviewed.       Assessment & Plan:   Encounter Diagnoses  Name Primary?  Marland Kitchen Uncontrolled type 2 diabetes mellitus with hyperglycemia ([place]) Yes  . Hyperlipidemia, unspecified hyperlipidemia type   . Essential hypertension, benign   . Abdominal pain, unspecified abdominal location   . Screening for breast cancer   . Tobacco use disorder     -ordered screening mammogram -Increase insuilin to 50units qhs.  She is to continue to monitor her sugars.  Pt reminded to call office for fbs < 70 or > 300 -Refer to GI for persistent abdominal pain and history colon polyps (colonoscopy 2012 with polyps -done by Dr Earley Brooke) -pt encouraged to Get on fish oil for her lipids -Add amlodipine for the blood pressure -pt to follow up 1 month to recheck bp and review bs log.  RTO sooner prn

## 2018-03-03 ENCOUNTER — Encounter: Payer: Self-pay | Admitting: Internal Medicine

## 2018-03-04 ENCOUNTER — Other Ambulatory Visit: Payer: Self-pay

## 2018-03-04 ENCOUNTER — Emergency Department (HOSPITAL_COMMUNITY): Payer: Self-pay

## 2018-03-04 ENCOUNTER — Emergency Department (HOSPITAL_COMMUNITY)
Admission: EM | Admit: 2018-03-04 | Discharge: 2018-03-04 | Disposition: A | Discharge status: Home / Self Care | Payer: Self-pay | Attending: Emergency Medicine | Admitting: Emergency Medicine

## 2018-03-04 ENCOUNTER — Encounter (HOSPITAL_COMMUNITY): Payer: Self-pay

## 2018-03-04 DIAGNOSIS — I10 Essential (primary) hypertension: Secondary | ICD-10-CM | POA: Insufficient documentation

## 2018-03-04 DIAGNOSIS — E119 Type 2 diabetes mellitus without complications: Secondary | ICD-10-CM | POA: Insufficient documentation

## 2018-03-04 DIAGNOSIS — F17210 Nicotine dependence, cigarettes, uncomplicated: Secondary | ICD-10-CM | POA: Insufficient documentation

## 2018-03-04 DIAGNOSIS — Z794 Long term (current) use of insulin: Secondary | ICD-10-CM | POA: Insufficient documentation

## 2018-03-04 DIAGNOSIS — Z79899 Other long term (current) drug therapy: Secondary | ICD-10-CM | POA: Insufficient documentation

## 2018-03-04 DIAGNOSIS — K8500 Idiopathic acute pancreatitis without necrosis or infection: Secondary | ICD-10-CM | POA: Insufficient documentation

## 2018-03-04 LAB — COMPREHENSIVE METABOLIC PANEL
ALK PHOS: 88 U/L (ref 38–126)
ALT: 12 U/L (ref 0–44)
AST: 18 U/L (ref 15–41)
Albumin: 3.7 g/dL (ref 3.5–5.0)
Anion gap: 12 (ref 5–15)
BUN: 11 mg/dL (ref 6–20)
CHLORIDE: 101 mmol/L (ref 98–111)
CO2: 24 mmol/L (ref 22–32)
Calcium: 9.5 mg/dL (ref 8.9–10.3)
Creatinine, Ser: 0.68 mg/dL (ref 0.44–1.00)
GFR calc Af Amer: >60 mL/min (ref >60)
GFR calc non Af Amer: >60 mL/min (ref >60)
GLUCOSE: 330 mg/dL — AB (ref 70–99)
Potassium: 3.4 mmol/L — ABNORMAL LOW (ref 3.5–5.1)
SODIUM: 137 mmol/L (ref 135–145)
Total Bilirubin: 0.4 mg/dL (ref 0.3–1.2)
Total Protein: 7.9 g/dL (ref 6.5–8.1)

## 2018-03-04 LAB — URINALYSIS, ROUTINE W REFLEX MICROSCOPIC
BACTERIA UA: NONE SEEN
Bilirubin Urine: NEGATIVE
Glucose, UA: >=500 mg/dL — AB
KETONES UR: NEGATIVE mg/dL
Leukocytes, UA: NEGATIVE
NITRITE: NEGATIVE
PROTEIN: NEGATIVE mg/dL
Specific Gravity, Urine: 1.028 (ref 1.005–1.030)
pH: 5 (ref 5.0–8.0)

## 2018-03-04 LAB — CBC
HEMATOCRIT: 38.7 % (ref 36.0–46.0)
HEMOGLOBIN: 12.9 g/dL (ref 12.0–15.0)
MCH: 29.5 pg (ref 26.0–34.0)
MCHC: 33.3 g/dL (ref 30.0–36.0)
MCV: 88.6 fL (ref 80.0–100.0)
Platelets: 274 10*3/uL (ref 150–400)
RBC: 4.37 MIL/uL (ref 3.87–5.11)
RDW: 12.8 % (ref 11.5–15.5)
WBC: 14 10*3/uL — ABNORMAL HIGH (ref 4.0–10.5)
nRBC: 0 % (ref 0.0–0.2)

## 2018-03-04 LAB — LIPASE, BLOOD: Lipase: 75 U/L — ABNORMAL HIGH (ref 11–51)

## 2018-03-04 MED ORDER — IOPAMIDOL (ISOVUE-300) INJECTION 61%
100.0000 mL | Freq: Once | INTRAVENOUS | Status: AC | PRN
  Administered 2018-03-04: 100 mL via INTRAVENOUS

## 2018-03-04 MED ORDER — SODIUM CHLORIDE 0.9 % IV BOLUS
500.0000 mL | Freq: Once | INTRAVENOUS | Status: AC
  Administered 2018-03-04: 500 mL via INTRAVENOUS

## 2018-03-04 NOTE — Discharge Instructions (Addendum)
Please monitor your condition carefully and do not hesitate to return here for any concerning changes in your condition.

## 2018-03-04 NOTE — ED Provider Notes (Signed)
[facility] EMERGENCY DEPARTMENT Provider Note   CSN: [id number] Arrival date & time: 03/04/18  1457     History   Chief Complaint Chief Complaint  Patient presents with  . Abdominal Pain    HPI Casey Johnston is a 57 y.o. female.  HPI Patient presents with concern of ongoing abdominal pain. Onset was a few weeks ago, but since onset pain is been persistent purulent pain is focally in the left upper quadrant, though with radiation inferiorly, occasional radiation to the right upper quadrant, with inferior radiation, and occasionally with pain in both shoulder blades. There is associated anorexia, nausea, new bowel movement pattern, including persistent constipation with occasional diarrhea. No obvious bleeding in the stool. No lightheadedness, chest pain, syncope. No relief with ibuprofen. She is here with her daughter who assists with the HPI. Past Medical History:  Diagnosis Date  . Anxiety and depression   . Diabetes mellitus, type II ([place])    Dr. Dorris Fetch managing  . Frequent headaches chronic   ibuprofen helps some  . History of UTI    Recurrent per pt (approx 3 per year)  . Hyperlipidemia   . Hypertension   . Leukocytosis 12/2015   mild lymphocytosis.  Path smear review reassuring.  Repeat 01/2016 stable.  . Morbid obesity ([place])   . OSA on CPAP     Patient Active Problem List   Diagnosis Date Noted  . Uncontrolled type 2 diabetes mellitus with complication, without long-term current use of insulin ([place]) 08/22/2015  . Hyperlipidemia 08/22/2015  . Essential hypertension, benign 08/22/2015  . Morbid obesity due to excess calories ([place]) 08/22/2015  . Personal history of noncompliance with medical treatment, presenting hazards to health 08/22/2015    Past Surgical History:  Procedure Laterality Date  . ABDOMINAL HYSTERECTOMY  1998   Dysfunctional uterine bleeding.  No hx of abnormal paps.  . COLONOSCOPY  approx 2012   Pt states it was done at [facility] by a GI  MD from [place].  Recall 10 yrs.     OB History   None      Home Medications    Prior to Admission medications   Medication Sig Start Date End Date Taking? Authorizing Provider  atorvastatin (LIPITOR) 20 MG tablet Take 1 tablet (20 mg total) by mouth daily. 02/05/18  Yes Soyla Dryer, PA-C  Doxylamine Succinate, Sleep, (SLEEP AID PO) Take 2 tablets by mouth at bedtime.   Yes [provider]  insulin glargine (LANTUS) 100 UNIT/ML injection Inject 40 Units into the skin at bedtime.    Yes [provider]  lisinopril-hydrochlorothiazide (PRINZIDE,ZESTORETIC) 20-25 MG tablet Take 1 tablet by mouth daily. 01/08/18  Yes Soyla Dryer, PA-C  metFORMIN (GLUCOPHAGE) 1000 MG tablet Take 1 tablet (1,000 mg total) by mouth 2 (two) times daily with a meal. 01/08/18  Yes Soyla Dryer, PA-C  amLODipine (NORVASC) 5 MG tablet Take 1 tablet (5 mg total) by mouth daily. 02/25/18   Soyla Dryer, PA-C  Omega-3 Fatty Acids (FISH OIL PO) Take 1 capsule by mouth daily.     [provider]    Family History Family History  Problem Relation Age of Onset  . Cancer Mother        ovarian  . COPD Father   . Diabetes Father     Social History Social History   Tobacco Use  . Smoking status: Current Every Day Smoker    Packs/day: 0.50    Years: 20.00    Pack years: 10.00  Types: Cigarettes  . Smokeless tobacco: Never Used  Substance Use Topics  . Alcohol use: No  . Drug use: No     Allergies   Patient has no known allergies.   Review of Systems Review of Systems  Constitutional:       Per HPI, otherwise negative  HENT:       Per HPI, otherwise negative  Respiratory:       Per HPI, otherwise negative  Cardiovascular:       Per HPI, otherwise negative  Gastrointestinal: Positive for abdominal pain, constipation, diarrhea and nausea. Negative for vomiting.  Endocrine:       Negative aside from HPI  Genitourinary:       Neg aside from HPI     Musculoskeletal:       Per HPI, otherwise negative  Skin: Negative.   Neurological: Negative for syncope.     Physical Exam Updated Vital Signs BP (!) 163/85 (BP Location: Right Arm)   Pulse 88   Temp 98.2 F (36.8 C) (Oral)   Resp 16   Ht 5' 4.5" (1.638 m)   Wt 82.1 kg   SpO2 97%   BMI 30.59 kg/m   Physical Exam  Constitutional: She is oriented to person, place, and time. She appears well-developed and well-nourished. No distress.  HENT:  Head: Normocephalic and atraumatic.  Eyes: Conjunctivae and EOM are normal.  Cardiovascular: Normal rate and regular rhythm.  Pulmonary/Chest: Effort normal and breath sounds normal. No stridor. No respiratory distress.  Abdominal: She exhibits no distension. There is no hepatosplenomegaly. There is tenderness in the left upper quadrant. There is no rigidity, no tenderness at McBurney's point and negative Murphy's sign.  Musculoskeletal: She exhibits no edema.  Neurological: She is alert and oriented to person, place, and time. No cranial nerve deficit.  Skin: Skin is warm and dry.  Psychiatric: She has a normal mood and affect.  Nursing note and vitals reviewed.    ED Treatments / Results  Labs (all labs ordered are listed, but only abnormal results are displayed) Labs Reviewed  LIPASE, BLOOD - Abnormal; Notable for the following components:      Result Value   Lipase 75 (*)    All other components within normal limits  COMPREHENSIVE METABOLIC PANEL - Abnormal; Notable for the following components:   Potassium 3.4 (*)    Glucose, Bld 330 (*)    All other components within normal limits  CBC - Abnormal; Notable for the following components:   WBC 14.0 (*)    All other components within normal limits  URINALYSIS, ROUTINE W REFLEX MICROSCOPIC - Abnormal; Notable for the following components:   Glucose, UA >=500 (*)    Hgb urine dipstick SMALL (*)    All other components within normal limits    Radiology Ct Abdomen Pelvis W  Contrast  Result Date: 03/04/2018 CLINICAL DATA:  Generalized abdominal pain radiating to the back. Nausea EXAM: CT ABDOMEN AND PELVIS WITH CONTRAST TECHNIQUE: Multidetector CT imaging of the abdomen and pelvis was performed using the standard protocol following bolus administration of intravenous contrast. CONTRAST:  178mL ISOVUE-300 IOPAMIDOL (ISOVUE-300) INJECTION 61% COMPARISON:  Radiography 02/10/2018 FINDINGS: Lower chest: Mild atelectasis or scarring at the lung bases. Hepatobiliary: Liver parenchyma is normal. No calcified gallstones. No CT evidence of gallbladder inflammation. Pancreas: Normal.  No evidence of pancreatitis. Spleen: Normal Adrenals/Urinary Tract: Adrenal glands are normal. The left kidney is normal. The right kidney is normal except for simple appearing cysts in the  midportion. No hydronephrosis. Ureters appear normal. No stone in the bladder. Stomach/Bowel: No acute bowel pathology. Appendix is within normal limits. Vascular/Lymphatic: Aortic atherosclerosis, mild. No aneurysm. IVC is normal. No adenopathy. Reproductive: Previous hysterectomy.  No pelvic mass. Other: No free fluid or air. Musculoskeletal: Ordinary lower lumbar degenerative changes. IMPRESSION: No acute finding. No cause of the presenting symptoms is identified. No evidence of acute organ or bowel pathology. Electronically Signed   By: Nelson Chimes M.D.   On: 03/04/2018 18:54    Procedures Procedures (including critical care time)  Medications Ordered in ED Medications  sodium chloride 0.9 % bolus 500 mL (0 mLs Intravenous Stopped 03/04/18 1938)  iopamidol (ISOVUE-300) 61 % injection 100 mL (100 mLs Intravenous Contrast Given 03/04/18 1823)     Initial Impression / Assessment and Plan / ED Course  I have reviewed the triage vital signs and the nursing notes.  Pertinent labs & imaging results that were available during my care of the patient were reviewed by me and considered in my medical decision making  (see chart for details).     7:44 PM On repeat exam the patient is awake, alert, in no distress. I reviewed all labs, CT, and then discussed with patient and her daughter notable for elevated lipase otherwise reassuring CT scan, labs are generally reassuring, with no substantial all letter light abnormalities. She now notes that she only recently stopped 1 of her diabetes medication, that is thought to potentially contribute to pancreatic irritation. Patient has a non-peritoneal abdomen, is afebrile, with no evidence for necrosis, is appropriate for outpatient management of her pancreatitis.  Final Clinical Impressions(s) / ED Diagnoses   Final diagnoses:  Idiopathic acute pancreatitis without infection or necrosis     Carmin Muskrat, MD 03/04/18 1945

## 2018-03-04 NOTE — ED Triage Notes (Signed)
Pt has been having generalized abdominal pain that moves and radiates to her back for the last few weeks. States she has never had this type of pain before. Denies urinary symptoms, but is nauseous constantly.

## 2018-03-20 ENCOUNTER — Encounter: Payer: Self-pay | Admitting: Physician Assistant

## 2018-03-20 ENCOUNTER — Ambulatory Visit: Payer: Self-pay | Admitting: Physician Assistant

## 2018-03-20 ENCOUNTER — Other Ambulatory Visit (HOSPITAL_COMMUNITY)
Admission: RE | Admit: 2018-03-20 | Discharge: 2018-03-20 | Disposition: A | Discharge status: Home / Self Care | Payer: Self-pay | Source: Ambulatory Visit | Attending: Physician Assistant | Admitting: Physician Assistant

## 2018-03-20 VITALS — BP 132/60 | HR 96 | Temp 97.7°F | Ht 64.5 in | Wt 181.0 lb

## 2018-03-20 DIAGNOSIS — R110 Nausea: Secondary | ICD-10-CM

## 2018-03-20 DIAGNOSIS — F17200 Nicotine dependence, unspecified, uncomplicated: Secondary | ICD-10-CM

## 2018-03-20 DIAGNOSIS — I10 Essential (primary) hypertension: Secondary | ICD-10-CM

## 2018-03-20 DIAGNOSIS — D72829 Elevated white blood cell count, unspecified: Secondary | ICD-10-CM

## 2018-03-20 DIAGNOSIS — K859 Acute pancreatitis without necrosis or infection, unspecified: Secondary | ICD-10-CM

## 2018-03-20 DIAGNOSIS — E785 Hyperlipidemia, unspecified: Secondary | ICD-10-CM

## 2018-03-20 DIAGNOSIS — R109 Unspecified abdominal pain: Secondary | ICD-10-CM

## 2018-03-20 DIAGNOSIS — E1165 Type 2 diabetes mellitus with hyperglycemia: Secondary | ICD-10-CM

## 2018-03-20 DIAGNOSIS — E119 Type 2 diabetes mellitus without complications: Secondary | ICD-10-CM

## 2018-03-20 LAB — COMPREHENSIVE METABOLIC PANEL
ALT: 14 U/L (ref 0–44)
AST: 18 U/L (ref 15–41)
Albumin: 3.9 g/dL (ref 3.5–5.0)
Alkaline Phosphatase: 79 U/L (ref 38–126)
Anion gap: 10 (ref 5–15)
BUN: 15 mg/dL (ref 6–20)
CALCIUM: 9.3 mg/dL (ref 8.9–10.3)
CHLORIDE: 100 mmol/L (ref 98–111)
CO2: 29 mmol/L (ref 22–32)
CREATININE: 0.63 mg/dL (ref 0.44–1.00)
Glucose, Bld: 231 mg/dL — ABNORMAL HIGH (ref 70–99)
Potassium: 3.3 mmol/L — ABNORMAL LOW (ref 3.5–5.1)
Sodium: 139 mmol/L (ref 135–145)
TOTAL PROTEIN: 8 g/dL (ref 6.5–8.1)
Total Bilirubin: 0.7 mg/dL (ref 0.3–1.2)

## 2018-03-20 LAB — LIPID PANEL
CHOL/HDL RATIO: 4.2 ratio
Cholesterol: 138 mg/dL (ref 0–200)
HDL: 33 mg/dL — ABNORMAL LOW (ref >40)
LDL CALC: 65 mg/dL (ref 0–99)
Triglycerides: 198 mg/dL — ABNORMAL HIGH (ref <150)
VLDL: 40 mg/dL (ref 0–40)

## 2018-03-20 LAB — CBC WITH DIFFERENTIAL/PLATELET
Abs Immature Granulocytes: 0.04 10*3/uL (ref 0.00–0.07)
BASOS PCT: 1 %
Basophils Absolute: 0.1 10*3/uL (ref 0.0–0.1)
EOS ABS: 0.2 10*3/uL (ref 0.0–0.5)
Eosinophils Relative: 2 %
HCT: 37.2 % (ref 36.0–46.0)
Hemoglobin: 12.2 g/dL (ref 12.0–15.0)
Immature Granulocytes: 0 %
Lymphocytes Relative: 45 %
Lymphs Abs: 5.6 10*3/uL — ABNORMAL HIGH (ref 0.7–4.0)
MCH: 29.2 pg (ref 26.0–34.0)
MCHC: 32.8 g/dL (ref 30.0–36.0)
MCV: 89 fL (ref 80.0–100.0)
MONO ABS: 0.5 10*3/uL (ref 0.1–1.0)
MONOS PCT: 4 %
NEUTROS ABS: 5.9 10*3/uL (ref 1.7–7.7)
Neutrophils Relative %: 48 %
PLATELETS: 276 10*3/uL (ref 150–400)
RBC: 4.18 MIL/uL (ref 3.87–5.11)
RDW: 13.1 % (ref 11.5–15.5)
WBC: 12.3 10*3/uL — ABNORMAL HIGH (ref 4.0–10.5)
nRBC: 0 % (ref 0.0–0.2)

## 2018-03-20 LAB — HEMOGLOBIN A1C
HEMOGLOBIN A1C: 11.5 % — AB (ref 4.8–5.6)
MEAN PLASMA GLUCOSE: 283.35 mg/dL

## 2018-03-20 LAB — LIPASE, BLOOD: Lipase: 30 U/L (ref 11–51)

## 2018-03-20 MED ORDER — PROMETHAZINE HCL 25 MG PO TABS: 25.0000 mg | ORAL_TABLET | Freq: Three times a day (TID) | ORAL | 0 refills | Status: DC | PRN

## 2018-03-20 NOTE — Progress Notes (Signed)
BP 132/60   Pulse 96   Temp 97.7 F (36.5 C)   Ht 5' 4.5" (1.638 m)   Wt 181 lb (82.1 kg)   SpO2 98%   BMI 30.59 kg/m    Subjective:    Patient ID: Casey Johnston, female    DOB: [birth year], 57 y.o.   MRN: [id number]  HPI: Casey Johnston is a 57 y.o. female presenting on 03/20/2018 for Abdominal Pain   HPI  Pt seen for pancreatitis in ER on Tuesday 10/15.  She says it was getting better until Monday and then "it went berserk".  Pt says she ate some pizza on Friday.  She can't recall eating anything greasy on saturday or sunday.  ER records from 10/15 were reviewed.    Pt says she is not drinking alcohol.   Pt denies fever and emesis.  She does endorse nausea.    Pt states her fbs mostly 232.   Pt states abd pain LUQ  Relevant past medical, surgical, family and social history reviewed and updated as indicated. Interim medical history since our last visit reviewed. Allergies and medications reviewed and updated.   Current Outpatient Medications:  .  amLODipine (NORVASC) 5 MG tablet, Take 1 tablet (5 mg total) by mouth daily., Disp: 90 tablet, Rfl: 1 .  atorvastatin (LIPITOR) 20 MG tablet, Take 1 tablet (20 mg total) by mouth daily., Disp: 90 tablet, Rfl: 2 .  insulin glargine (LANTUS) 100 UNIT/ML injection, Inject 40 Units into the skin at bedtime. , Disp: , Rfl:  .  lisinopril-hydrochlorothiazide (PRINZIDE,ZESTORETIC) 20-25 MG tablet, Take 1 tablet by mouth daily., Disp: 30 tablet, Rfl: 1 .  metFORMIN (GLUCOPHAGE) 1000 MG tablet, Take 1 tablet (1,000 mg total) by mouth 2 (two) times daily with a meal., Disp: 60 tablet, Rfl: 1 .  Omega-3 Fatty Acids (FISH OIL PO), Take 1 capsule by mouth daily. , Disp: , Rfl:  .  Doxylamine Succinate, Sleep, (SLEEP AID PO), Take 2 tablets by mouth at bedtime., Disp: , Rfl:    Review of Systems  Constitutional: Positive for appetite change and fatigue. Negative for chills, diaphoresis, fever and unexpected weight change.  HENT:  Negative for congestion, dental problem, drooling, ear pain, facial swelling, hearing loss, mouth sores, sneezing, sore throat, trouble swallowing and voice change.   Eyes: Positive for redness and itching. Negative for pain, discharge and visual disturbance.  Respiratory: Negative for cough, choking, shortness of breath and wheezing.   Cardiovascular: Negative for chest pain, palpitations and leg swelling.  Gastrointestinal: Positive for abdominal pain and constipation. Negative for blood in stool, diarrhea and vomiting.  Endocrine: Negative for cold intolerance, heat intolerance and polydipsia.  Genitourinary: Negative for decreased urine volume, dysuria and hematuria.  Musculoskeletal: Negative for arthralgias, back pain and gait problem.  Skin: Negative for rash.  Allergic/Immunologic: Negative for environmental allergies.  Neurological: Negative for seizures, syncope, light-headedness and headaches.  Hematological: Negative for adenopathy.  Psychiatric/Behavioral: Negative for agitation, dysphoric mood and suicidal ideas. The patient is not nervous/anxious.     Per HPI unless specifically indicated above     Objective:    BP 132/60   Pulse 96   Temp 97.7 F (36.5 C)   Ht 5' 4.5" (1.638 m)   Wt 181 lb (82.1 kg)   SpO2 98%   BMI 30.59 kg/m   Wt Readings from Last 3 Encounters:  03/20/18 181 lb (82.1 kg)  03/04/18 181 lb (82.1 kg)  02/25/18 181 lb (82.1 kg)  Orthostatic VS for the past 24 hrs:  BP- Lying Pulse- Lying BP- Sitting Pulse- Sitting BP- Standing at 0 minutes Pulse- Standing at 0 minutes  03/20/18 0906 136/79 79 131/76 84 134/77 95      Physical Exam  Constitutional: She is oriented to person, place, and time. She appears well-developed and well-nourished.  HENT:  Head: Normocephalic and atraumatic.  Neck: Neck supple.  Cardiovascular: Normal rate and regular rhythm.  Pulmonary/Chest: Effort normal and breath sounds normal.  Abdominal: Soft. Bowel sounds  are normal. She exhibits no mass. There is no hepatosplenomegaly. There is no tenderness. There is no rigidity, no rebound, no guarding and no CVA tenderness.  Musculoskeletal: She exhibits no edema.  Lymphadenopathy:    She has no cervical adenopathy.  Neurological: She is alert and oriented to person, place, and time.  Skin: Skin is warm and dry.  Psychiatric: She has a normal mood and affect. Her behavior is normal.  Vitals reviewed.       Assessment & Plan:   Encounter Diagnoses  Name Primary?  Marland Kitchen Uncontrolled type 2 diabetes mellitus with hyperglycemia ([place]) Yes  . Hyperlipidemia, unspecified hyperlipidemia type   . Essential hypertension, benign   . Acute pancreatitis, unspecified complication status, unspecified pancreatitis type   . Tobacco use disorder   . Abdominal pain, unspecified abdominal location   . Nausea      -CT done 10/15 reviewed -discussed with pt difficult determination of etiology for many cases pancreatitis that are not due to GB or alcohol.  Since she felt she was getting better after being seen in the ER, will not try stopping any of her current medications.  Since pt exam unremarkable and she is afebrile, will not repeat imaging today.  Pt is counseled to drink plenty of water and avoid fatty or greasy foods.  Encouraged her to eat bland foods.  She is given rx pheneregan.  She is counseled to go to  ER if she starts having emesis or fever or if her pain worsens. -pt is given reading information on pancreatitis -pt has routine follow up here on November 5.  Will reassess at that time.

## 2018-03-20 NOTE — Patient Instructions (Signed)
Acute Pancreatitis  Acute pancreatitis is a condition in which the pancreas suddenly becomes irritated and swollen (has inflammation). The pancreas is a gland that is located behind the stomach. It produces enzymes that help to digest food. The pancreas also releases the hormones glucagon and insulin, which help to regulate blood sugar. Damage to the pancreas occurs when the digestive enzymes from the pancreas are activated before they are released into the intestine.  Most acute attacks last a couple of days and can cause serious problems. Some people become dehydrated and develop low blood pressure. In severe cases, bleeding into the pancreas can lead to shock and can be life-threatening. The lungs, heart, and kidneys may fail.  What are the causes?  The most common causes of this condition are:  · Alcohol abuse.  · Gallstones.    Other causes include:  · Certain medicines.  · Exposure to certain chemicals.  · Infection.  · Damage caused by an accident (trauma).  · Abdominal surgery.    In some cases, the cause may not be known.  What are the signs or symptoms?  Symptoms of this condition include:  · Pain in the upper abdomen that may radiate to the back.  · Tenderness and swelling of the abdomen.  · Nausea and vomiting.    How is this diagnosed?  This condition may be diagnosed based on:  · A physical exam.  · Blood tests.  · Imaging tests, such as X-rays, CT scans, or an ultrasound of the abdomen.    How is this treated?  Treatment for this condition usually requires a stay in the hospital. Treatment may include:  · Pain medicine.  · Fluid replacement through an IV tube.  · Placing a tube in the stomach to remove stomach contents and to control vomiting (NG tube, or nasogastric tube).  · Not eating for 3-4 days. This gives the pancreas a rest, because enzymes are not being produced that can cause further damage.  · Antibiotic medicines, if your condition is caused by an infection.  · Surgery on the pancreas or  gallbladder.    Follow these instructions at home:  Eating and drinking  · Follow instructions from your health care provider about diet. This may involve avoiding alcohol and decreasing the amount of fat in your diet.  · Eat smaller, more frequent meals. This reduces the amount of digestive fluids that the pancreas produces.  · Drink enough fluid to keep your urine clear or pale yellow.  · Do not drink alcohol if it caused your condition.  General instructions  · Take over-the-counter and prescription medicines only as told by your health care provider.  · Do not use any tobacco products, such as cigarettes, chewing tobacco, and e-cigarettes. If you need help quitting, ask your health care provider.  · Get plenty of rest.  · If directed, check your blood sugar at home as told by your health care provider.  · Keep all follow-up visits as told by your health care provider. This is important.  Contact a health care provider if:  · You do not recover as quickly as expected.  · You develop new or worsening symptoms.  · You have persistent pain, weakness, or nausea.  · You recover and then have another episode of pain.  · You have a fever.  Get help right away if:  · You cannot eat or keep fluids down.  · Your pain becomes severe.  · Your skin or the   white part of your eyes turns yellow (jaundice).  · You vomit.  · You feel dizzy or you faint.  · Your blood sugar is high (over 300 mg/dL).  This information is not intended to replace advice given to you by your health care provider. Make sure you discuss any questions you have with your health care provider.  Document Released: 05/07/2005 Document Revised: 09/14/2015 Document Reviewed: 02/08/2015  [employer] Interactive Patient Education © 2018 [employer]

## 2018-03-21 LAB — MICROALBUMIN, URINE: Microalb, Ur: 127.3 ug/mL — ABNORMAL HIGH

## 2018-03-25 ENCOUNTER — Ambulatory Visit: Payer: Self-pay | Admitting: Physician Assistant

## 2018-03-25 ENCOUNTER — Encounter: Payer: Self-pay | Admitting: Physician Assistant

## 2018-03-25 VITALS — BP 155/74 | HR 94 | Temp 97.9°F | Ht 64.5 in | Wt 188.0 lb

## 2018-03-25 DIAGNOSIS — E785 Hyperlipidemia, unspecified: Secondary | ICD-10-CM

## 2018-03-25 DIAGNOSIS — F17200 Nicotine dependence, unspecified, uncomplicated: Secondary | ICD-10-CM

## 2018-03-25 DIAGNOSIS — E669 Obesity, unspecified: Secondary | ICD-10-CM

## 2018-03-25 DIAGNOSIS — I10 Essential (primary) hypertension: Secondary | ICD-10-CM

## 2018-03-25 DIAGNOSIS — R109 Unspecified abdominal pain: Secondary | ICD-10-CM

## 2018-03-25 DIAGNOSIS — E1165 Type 2 diabetes mellitus with hyperglycemia: Secondary | ICD-10-CM

## 2018-03-25 MED ORDER — OMEPRAZOLE 40 MG PO CPDR: 40.0000 mg | DELAYED_RELEASE_CAPSULE | Freq: Every day | ORAL | 1 refills | Status: DC

## 2018-03-25 MED ORDER — METFORMIN HCL 1000 MG PO TABS: 1000.0000 mg | ORAL_TABLET | Freq: Two times a day (BID) | ORAL | 1 refills | Status: DC

## 2018-03-25 NOTE — Progress Notes (Signed)
BP (!) 160/70 (BP Location: Left Arm, Patient Position: Sitting, Cuff Size: Normal)   Pulse 94   Temp 97.9 F (36.6 C)   Ht 5' 4.5" (1.638 m)   Wt 188 lb (85.3 kg)   SpO2 98%   BMI 31.77 kg/m    Subjective:    Patient ID: Casey Johnston, female    DOB: 01/15/1961, 57 y.o.   MRN: [id number]  HPI: Casey Johnston is a 57 y.o. female presenting on 03/25/2018 for Diabetes; Hypertension; and Abdominal Pain   HPI   Pt is feeling somewhat better today than at last OV.  She is eating but is avoiding greasy foods.   She is moving her bowels.  No emesis since before last OV.   No dysphagia.  Pt states Pain is in epigastric area.   Pt is using the phenergan every 8 hours.  She is worried if she doesn't take it, she will feel bad.   When she checks her bs at home it's 232 and above.  She is using 40u lantus  Pt had colonoscopy in 2012 and she says it was normal  Relevant past medical, surgical, family and social history reviewed and updated as indicated. Interim medical history since our last visit reviewed. Allergies and medications reviewed and updated.   Current Outpatient Medications:  .  amLODipine (NORVASC) 5 MG tablet, Take 1 tablet (5 mg total) by mouth daily., Disp: 90 tablet, Rfl: 1 .  atorvastatin (LIPITOR) 20 MG tablet, Take 1 tablet (20 mg total) by mouth daily., Disp: 90 tablet, Rfl: 2 .  insulin glargine (LANTUS) 100 UNIT/ML injection, Inject 40 Units into the skin at bedtime. , Disp: , Rfl:  .  lisinopril-hydrochlorothiazide (PRINZIDE,ZESTORETIC) 20-25 MG tablet, Take 1 tablet by mouth daily., Disp: 30 tablet, Rfl: 1 .  metFORMIN (GLUCOPHAGE) 1000 MG tablet, Take 1 tablet (1,000 mg total) by mouth 2 (two) times daily with a meal., Disp: 60 tablet, Rfl: 1 .  Omega-3 Fatty Acids (FISH OIL PO), Take 1 capsule by mouth daily. , Disp: , Rfl:  .  promethazine (PHENERGAN) 25 MG tablet, Take 1 tablet (25 mg total) by mouth every 8 (eight) hours as needed for nausea or  vomiting., Disp: 20 tablet, Rfl: 0 .  Doxylamine Succinate, Sleep, (SLEEP AID PO), Take 2 tablets by mouth at bedtime., Disp: , Rfl:     Review of Systems  Constitutional: Positive for appetite change. Negative for chills, diaphoresis, fatigue, fever and unexpected weight change.  HENT: Negative for congestion, drooling, ear pain, facial swelling, hearing loss, mouth sores, sneezing, sore throat, trouble swallowing and voice change.   Eyes: Positive for redness and itching. Negative for pain, discharge and visual disturbance.  Respiratory: Negative for cough, choking, shortness of breath and wheezing.   Cardiovascular: Negative for chest pain, palpitations and leg swelling.  Gastrointestinal: Positive for abdominal pain and constipation. Negative for blood in stool, diarrhea and vomiting.  Endocrine: Negative for cold intolerance, heat intolerance and polydipsia.  Genitourinary: Negative for decreased urine volume, dysuria and hematuria.  Musculoskeletal: Negative for arthralgias, back pain and gait problem.  Skin: Negative for rash.  Allergic/Immunologic: Negative for environmental allergies.  Neurological: Positive for light-headedness. Negative for seizures, syncope and headaches.  Hematological: Negative for adenopathy.  Psychiatric/Behavioral: Negative for agitation, dysphoric mood and suicidal ideas. The patient is not nervous/anxious.     Per HPI unless specifically indicated above     Objective:    BP (!) 160/70 (BP Location: Left Arm,  Patient Position: Sitting, Cuff Size: Normal)   Pulse 94   Temp 97.9 F (36.6 C)   Ht 5' 4.5" (1.638 m)   Wt 188 lb (85.3 kg)   SpO2 98%   BMI 31.77 kg/m   Wt Readings from Last 3 Encounters:  03/25/18 188 lb (85.3 kg)  03/20/18 181 lb (82.1 kg)  03/04/18 181 lb (82.1 kg)    Physical Exam  Constitutional: She is oriented to person, place, and time. She appears well-developed and well-nourished.  HENT:  Head: Normocephalic and  atraumatic.  Neck: Neck supple.  Cardiovascular: Normal rate and regular rhythm.  Pulmonary/Chest: Effort normal and breath sounds normal.  Abdominal: Soft. Bowel sounds are normal. She exhibits no mass. There is no hepatosplenomegaly. There is tenderness (mild) in the epigastric area. There is no rigidity, no rebound and no guarding.  Musculoskeletal: She exhibits no edema.  Lymphadenopathy:    She has no cervical adenopathy.  Neurological: She is alert and oriented to person, place, and time.  Skin: Skin is warm and dry.  Psychiatric: She has a normal mood and affect. Her behavior is normal.  Vitals reviewed.   Results for orders placed or performed during the hospital encounter of 03/20/18  Lipase, blood  Result Value Ref Range   Lipase 30 11 - 51 U/L  Hemoglobin A1c  Result Value Ref Range   Hgb A1c MFr Bld 11.5 (H) 4.8 - 5.6 %   Mean Plasma Glucose 283.35 mg/dL  CBC w/Diff/Platelet  Result Value Ref Range   WBC 12.3 (H) 4.0 - 10.5 K/uL   RBC 4.18 3.87 - 5.11 MIL/uL   Hemoglobin 12.2 12.0 - 15.0 g/dL   HCT 37.2 36.0 - 46.0 %   MCV 89.0 80.0 - 100.0 fL   MCH 29.2 26.0 - 34.0 pg   MCHC 32.8 30.0 - 36.0 g/dL   RDW 13.1 11.5 - 15.5 %   Platelets 276 150 - 400 K/uL   nRBC 0.0 0.0 - 0.2 %   Neutrophils Relative % 48 %   Neutro Abs 5.9 1.7 - 7.7 K/uL   Lymphocytes Relative 45 %   Lymphs Abs 5.6 (H) 0.7 - 4.0 K/uL   Monocytes Relative 4 %   Monocytes Absolute 0.5 0.1 - 1.0 K/uL   Eosinophils Relative 2 %   Eosinophils Absolute 0.2 0.0 - 0.5 K/uL   Basophils Relative 1 %   Basophils Absolute 0.1 0.0 - 0.1 K/uL   Immature Granulocytes 0 %   Abs Immature Granulocytes 0.04 0.00 - 0.07 K/uL  Lipid panel  Result Value Ref Range   Cholesterol 138 0 - 200 mg/dL   Triglycerides 198 (H) <150 mg/dL   HDL 33 (L) >40 mg/dL   Total CHOL/HDL Ratio 4.2 RATIO   VLDL 40 0 - 40 mg/dL   LDL Cholesterol 65 0 - 99 mg/dL  Comprehensive metabolic panel  Result Value Ref Range   Sodium 139  135 - 145 mmol/L   Potassium 3.3 (L) 3.5 - 5.1 mmol/L   Chloride 100 98 - 111 mmol/L   CO2 29 22 - 32 mmol/L   Glucose, Bld 231 (H) 70 - 99 mg/dL   BUN 15 6 - 20 mg/dL   Creatinine, Ser 0.63 0.44 - 1.00 mg/dL   Calcium 9.3 8.9 - 10.3 mg/dL   Total Protein 8.0 6.5 - 8.1 g/dL   Albumin 3.9 3.5 - 5.0 g/dL   AST 18 15 - 41 U/L   ALT 14 0 - 44 U/L  Alkaline Phosphatase 79 38 - 126 U/L   Total Bilirubin 0.7 0.3 - 1.2 mg/dL   GFR calc non Af Amer >60 >60 mL/min   GFR calc Af Amer >60 >60 mL/min   Anion gap 10 5 - 15  Microalbumin, urine  Result Value Ref Range   Microalb, Ur 127.3 (H) Not Estab. ug/mL      Assessment & Plan:   Encounter Diagnoses  Name Primary?  . Abdominal pain, unspecified abdominal location Yes  . Uncontrolled type 2 diabetes mellitus with hyperglycemia ([place])   . Hyperlipidemia, unspecified hyperlipidemia type   . Essential hypertension, benign   . Tobacco use disorder   . Obesity, unspecified classification, unspecified obesity type, unspecified whether serious comorbidity present     -Reviewed labs with pt -Add omeprazole.  -will Increase lantus to 50units.  Pt to Monitor bs.  She is reminded to call office for fbs < 70 or > 300 -pt counseled to Continue to avoid fatty foods.  -pt to follow up in 4 weeks.  She is to RTO sooner if her pain worsens or if she doesn't continue to improve.  She has appointment with GI in January for abdominal pain and history colon polyps

## 2018-04-23 ENCOUNTER — Ambulatory Visit: Payer: Self-pay | Admitting: Physician Assistant

## 2018-05-21 ENCOUNTER — Other Ambulatory Visit: Payer: Self-pay

## 2018-05-21 ENCOUNTER — Emergency Department (HOSPITAL_COMMUNITY): Payer: Self-pay

## 2018-05-21 ENCOUNTER — Encounter (HOSPITAL_COMMUNITY): Payer: Self-pay | Admitting: Emergency Medicine

## 2018-05-21 ENCOUNTER — Emergency Department (HOSPITAL_COMMUNITY)
Admission: EM | Admit: 2018-05-21 | Discharge: 2018-05-21 | Disposition: A | Discharge status: Home / Self Care | Payer: Self-pay | Attending: Emergency Medicine | Admitting: Emergency Medicine

## 2018-05-21 DIAGNOSIS — F17210 Nicotine dependence, cigarettes, uncomplicated: Secondary | ICD-10-CM | POA: Insufficient documentation

## 2018-05-21 DIAGNOSIS — Z79899 Other long term (current) drug therapy: Secondary | ICD-10-CM | POA: Insufficient documentation

## 2018-05-21 DIAGNOSIS — I10 Essential (primary) hypertension: Secondary | ICD-10-CM | POA: Insufficient documentation

## 2018-05-21 DIAGNOSIS — Z794 Long term (current) use of insulin: Secondary | ICD-10-CM | POA: Insufficient documentation

## 2018-05-21 DIAGNOSIS — E119 Type 2 diabetes mellitus without complications: Secondary | ICD-10-CM | POA: Insufficient documentation

## 2018-05-21 DIAGNOSIS — K2970 Gastritis, unspecified, without bleeding: Secondary | ICD-10-CM | POA: Insufficient documentation

## 2018-05-21 LAB — URINALYSIS, ROUTINE W REFLEX MICROSCOPIC
Bacteria, UA: NONE SEEN
Bilirubin Urine: NEGATIVE
Glucose, UA: >=500 mg/dL — AB
Hgb urine dipstick: NEGATIVE
Ketones, ur: NEGATIVE mg/dL
Leukocytes, UA: NEGATIVE
Nitrite: NEGATIVE
Protein, ur: NEGATIVE mg/dL
Specific Gravity, Urine: 1.028 (ref 1.005–1.030)
pH: 5 (ref 5.0–8.0)

## 2018-05-21 LAB — LIPASE, BLOOD: Lipase: 34 U/L (ref 11–51)

## 2018-05-21 LAB — COMPREHENSIVE METABOLIC PANEL
ALT: 13 U/L (ref 0–44)
AST: 16 U/L (ref 15–41)
Albumin: 4.2 g/dL (ref 3.5–5.0)
Alkaline Phosphatase: 77 U/L (ref 38–126)
Anion gap: 10 (ref 5–15)
BUN: 13 mg/dL (ref 6–20)
CO2: 27 mmol/L (ref 22–32)
Calcium: 9.6 mg/dL (ref 8.9–10.3)
Chloride: 102 mmol/L (ref 98–111)
Creatinine, Ser: 0.65 mg/dL (ref 0.44–1.00)
GFR calc Af Amer: >60 mL/min (ref >60)
GFR calc non Af Amer: >60 mL/min (ref >60)
GLUCOSE: 392 mg/dL — AB (ref 70–99)
Potassium: 3.8 mmol/L (ref 3.5–5.1)
Sodium: 139 mmol/L (ref 135–145)
Total Bilirubin: 0.3 mg/dL (ref 0.3–1.2)
Total Protein: 8 g/dL (ref 6.5–8.1)

## 2018-05-21 LAB — CBC
HCT: 42.6 % (ref 36.0–46.0)
Hemoglobin: 14.2 g/dL (ref 12.0–15.0)
MCH: 30.1 pg (ref 26.0–34.0)
MCHC: 33.3 g/dL (ref 30.0–36.0)
MCV: 90.3 fL (ref 80.0–100.0)
Platelets: 268 10*3/uL (ref 150–400)
RBC: 4.72 MIL/uL (ref 3.87–5.11)
RDW: 12.7 % (ref 11.5–15.5)
WBC: 11.5 10*3/uL — ABNORMAL HIGH (ref 4.0–10.5)
nRBC: 0 % (ref 0.0–0.2)

## 2018-05-21 MED ORDER — ALUM & MAG HYDROXIDE-SIMETH 200-200-20 MG/5ML PO SUSP
30.0000 mL | Freq: Once | ORAL | Status: AC
  Administered 2018-05-21: 30 mL via ORAL
  Filled 2018-05-21: qty 30

## 2018-05-21 MED ORDER — SUCRALFATE 1 G PO TABS: 1.0000 g | ORAL_TABLET | Freq: Three times a day (TID) | ORAL | 0 refills | Status: DC

## 2018-05-21 MED ORDER — LIDOCAINE VISCOUS HCL 2 % MT SOLN
15.0000 mL | Freq: Once | OROMUCOSAL | Status: AC
  Administered 2018-05-21: 15 mL via ORAL
  Filled 2018-05-21: qty 15

## 2018-05-21 MED ORDER — ONDANSETRON 4 MG PO TBDP
4.0000 mg | ORAL_TABLET | Freq: Once | ORAL | Status: AC
  Administered 2018-05-21: 4 mg via ORAL
  Filled 2018-05-21: qty 1

## 2018-05-21 MED ORDER — ONDANSETRON HCL 4 MG PO TABS: 4.0000 mg | ORAL_TABLET | Freq: Four times a day (QID) | ORAL | 0 refills | Status: DC | PRN

## 2018-05-21 NOTE — ED Notes (Signed)
Pt to XR

## 2018-05-21 NOTE — ED Provider Notes (Signed)
[facility] EMERGENCY DEPARTMENT Provider Note   CSN: [id number] Arrival date & time: 05/21/18  1927     History   Chief Complaint Chief Complaint  Patient presents with  . Abdominal Pain    HPI Casey Johnston is a 58 y.o. female.  HPI Patient states she has had 2 months of upper abdominal pain she has she was evaluated in the emergency department and diagnosed with pancreatitis.  States the pain is worse after eating.  She has been taking 800 mg of ibuprofen every 6 hours.  She has had nausea but denies any vomiting.  Patient states she has been constipated and strains to have bowel movements.  No grossly bloody or melanotic stools.  No fever or chills.  Pain does not radiate.  She has not seen a gastroenterologist. Past Medical History:  Diagnosis Date  . Anxiety and depression   . Diabetes mellitus, type II ([place])    Dr. Dorris Fetch managing  . Frequent headaches chronic   ibuprofen helps some  . History of UTI    Recurrent per pt (approx 3 per year)  . Hyperlipidemia   . Hypertension   . Leukocytosis 12/2015   mild lymphocytosis.  Path smear review reassuring.  Repeat 01/2016 stable.  . Morbid obesity ([place])   . OSA on CPAP     Patient Active Problem List   Diagnosis Date Noted  . Uncontrolled type 2 diabetes mellitus with complication, without long-term current use of insulin ([place]) 08/22/2015  . Hyperlipidemia 08/22/2015  . Essential hypertension, benign 08/22/2015  . Morbid obesity due to excess calories ([place]) 08/22/2015  . Personal history of noncompliance with medical treatment, presenting hazards to health 08/22/2015    Past Surgical History:  Procedure Laterality Date  . ABDOMINAL HYSTERECTOMY  1998   Dysfunctional uterine bleeding.  No hx of abnormal paps.  . COLONOSCOPY  approx 2012   Pt states it was done at [facility] by a GI MD from [place].  Recall 10 yrs.     OB History    Gravida  3   Para  1   Term  1   Preterm      AB  2   Living        SAB  2   TAB      Ectopic      Multiple      Live Births               Home Medications    Prior to Admission medications   Medication Sig Start Date End Date Taking? Authorizing Provider  amLODipine (NORVASC) 5 MG tablet Take 1 tablet (5 mg total) by mouth daily. 02/25/18   Soyla Dryer, PA-C  atorvastatin (LIPITOR) 20 MG tablet Take 1 tablet (20 mg total) by mouth daily. 02/05/18   Soyla Dryer, PA-C  Doxylamine Succinate, Sleep, (SLEEP AID PO) Take 2 tablets by mouth at bedtime.    [provider]  insulin glargine (LANTUS) 100 UNIT/ML injection Inject 40 Units into the skin at bedtime.     [provider]  lisinopril-hydrochlorothiazide (PRINZIDE,ZESTORETIC) 20-25 MG tablet Take 1 tablet by mouth daily. 01/08/18   Soyla Dryer, PA-C  metFORMIN (GLUCOPHAGE) 1000 MG tablet Take 1 tablet (1,000 mg total) by mouth 2 (two) times daily with a meal. 03/25/18   Soyla Dryer, PA-C  Omega-3 Fatty Acids (FISH OIL PO) Take 1 capsule by mouth daily.     [provider]  omeprazole (PRILOSEC) 40 MG capsule  Take 1 capsule (40 mg total) by mouth daily. 03/25/18   Soyla Dryer, PA-C  omeprazole (PRILOSEC) 40 MG capsule Take 1 capsule (40 mg total) by mouth daily. 03/25/18   Soyla Dryer, PA-C  ondansetron (ZOFRAN) 4 MG tablet Take 1 tablet (4 mg total) by mouth every 6 (six) hours as needed for nausea or vomiting. 05/21/18   Julianne Rice, MD  promethazine (PHENERGAN) 25 MG tablet Take 1 tablet (25 mg total) by mouth every 8 (eight) hours as needed for nausea or vomiting. 03/20/18   Soyla Dryer, PA-C  sucralfate (CARAFATE) 1 g tablet Take 1 tablet (1 g total) by mouth 4 (four) times daily -  with meals and at bedtime. 05/21/18   Julianne Rice, MD    Family History Family History  Problem Relation Age of Onset  . Cancer Mother        ovarian  . COPD Father   . Diabetes Father     Social History Social History   Tobacco Use  .  Smoking status: Current Every Day Smoker    Packs/day: 0.50    Years: 20.00    Pack years: 10.00    Types: Cigarettes  . Smokeless tobacco: Never Used  Substance Use Topics  . Alcohol use: No  . Drug use: No     Allergies   Januvia [sitagliptin]   Review of Systems Review of Systems  Constitutional: Negative for chills and fever.  HENT: Negative for trouble swallowing.   Eyes: Negative for visual disturbance.  Respiratory: Negative for cough and shortness of breath.   Cardiovascular: Negative for chest pain.  Gastrointestinal: Positive for abdominal pain, constipation and nausea. Negative for blood in stool, diarrhea and vomiting.  Genitourinary: Negative for dysuria, flank pain, frequency, hematuria and pelvic pain.  Musculoskeletal: Negative for back pain, myalgias and neck pain.  Skin: Negative for rash and wound.  Neurological: Negative for dizziness, weakness, light-headedness, numbness and headaches.  All other systems reviewed and are negative.    Physical Exam Updated Vital Signs BP (!) 182/78 (BP Location: Right Arm)   Pulse 100   Temp (!) 97 F (36.1 C) (Temporal)   Resp 12   Ht 5\' 5"  (1.651 m)   Wt 78 kg   SpO2 98%   BMI 28.62 kg/m   Physical Exam Vitals signs and nursing note reviewed.  Constitutional:      General: She is not in acute distress.    Appearance: Normal appearance. She is well-developed. She is not ill-appearing.  HENT:     Head: Normocephalic and atraumatic.     Nose: Nose normal.     Mouth/Throat:     Mouth: Mucous membranes are moist.     Pharynx: No oropharyngeal exudate or posterior oropharyngeal erythema.  Eyes:     Pupils: Pupils are equal, round, and reactive to light.  Neck:     Musculoskeletal: Normal range of motion and neck supple. No neck rigidity or muscular tenderness.     Vascular: No carotid bruit.  Cardiovascular:     Rate and Rhythm: Normal rate and regular rhythm.     Heart sounds: No murmur. No friction rub.  No gallop.   Pulmonary:     Effort: Pulmonary effort is normal.     Breath sounds: Normal breath sounds.  Abdominal:     General: Bowel sounds are normal.     Palpations: Abdomen is soft.     Tenderness: There is abdominal tenderness. There is no guarding or rebound.  Comments: Left upper quadrant tenderness to palpation.  No rebound or guarding.  Musculoskeletal: Normal range of motion.        General: No tenderness.     Comments: No midline thoracic or lumbar tenderness.  No CVA tenderness.  No lower extremity swelling, asymmetry or tenderness.  Lymphadenopathy:     Cervical: No cervical adenopathy.  Skin:    General: Skin is warm and dry.     Capillary Refill: Capillary refill takes less than 2 seconds.     Findings: No erythema or rash.  Neurological:     General: No focal deficit present.     Mental Status: She is alert and oriented to person, place, and time.  Psychiatric:        Mood and Affect: Mood normal.        Behavior: Behavior normal.      ED Treatments / Results  Labs (all labs ordered are listed, but only abnormal results are displayed) Labs Reviewed  COMPREHENSIVE METABOLIC PANEL - Abnormal; Notable for the following components:      Result Value   Glucose, Bld 392 (*)    All other components within normal limits  CBC - Abnormal; Notable for the following components:   WBC 11.5 (*)    All other components within normal limits  URINALYSIS, ROUTINE W REFLEX MICROSCOPIC - Abnormal; Notable for the following components:   Glucose, UA >=500 (*)    All other components within normal limits  LIPASE, BLOOD    EKG None  Radiology No results found.  Procedures Procedures (including critical care time)  Medications Ordered in ED Medications  alum & mag hydroxide-simeth (MAALOX/MYLANTA) 200-200-20 MG/5ML suspension 30 mL (30 mLs Oral Given 05/21/18 2111)    And  lidocaine (XYLOCAINE) 2 % viscous mouth solution 15 mL (15 mLs Oral Given 05/21/18 2111)    ondansetron (ZOFRAN-ODT) disintegrating tablet 4 mg (4 mg Oral Given 05/21/18 2111)     Initial Impression / Assessment and Plan / ED Course  I have reviewed the triage vital signs and the nursing notes.  Pertinent labs & imaging results that were available during my care of the patient were reviewed by me and considered in my medical decision making (see chart for details).     Lipase is normal.  Patient symptoms have significantly improved after GI cocktail.  Suspect she has NSAID induced gastritis.  She is advised to avoid all NSAIDs.  Continue PPI.  Will also treat with Carafate and advised follow-up with gastroenterology.  Return precautions given.  Final Clinical Impressions(s) / ED Diagnoses   Final diagnoses:  Gastritis without bleeding, unspecified chronicity, unspecified gastritis type    ED Discharge Orders         Ordered    sucralfate (CARAFATE) 1 g tablet  3 times daily with meals & bedtime     05/21/18 2215    ondansetron (ZOFRAN) 4 MG tablet  Every 6 hours PRN     05/21/18 2215           Julianne Rice, MD 05/24/18 [phone]

## 2018-05-21 NOTE — ED Notes (Signed)
Patient ambulated to restroom to obtain a urine specimen. Patient drinking water at this time also.

## 2018-05-21 NOTE — Discharge Instructions (Addendum)
Avoid ibuprofen and all NSAIDs.  Follow-up closely with the gastroenterologist.

## 2018-05-21 NOTE — ED Triage Notes (Signed)
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

## 2018-05-26 ENCOUNTER — Other Ambulatory Visit: Payer: Self-pay | Admitting: Physician Assistant

## 2018-05-28 ENCOUNTER — Ambulatory Visit: Payer: Self-pay | Admitting: Physician Assistant

## 2018-05-28 ENCOUNTER — Encounter: Payer: Self-pay | Admitting: Physician Assistant

## 2018-05-28 VITALS — BP 150/74 | HR 91 | Temp 98.2°F | Ht 64.5 in | Wt 173.2 lb

## 2018-05-28 DIAGNOSIS — E785 Hyperlipidemia, unspecified: Secondary | ICD-10-CM

## 2018-05-28 DIAGNOSIS — R109 Unspecified abdominal pain: Secondary | ICD-10-CM

## 2018-05-28 DIAGNOSIS — I10 Essential (primary) hypertension: Secondary | ICD-10-CM

## 2018-05-28 DIAGNOSIS — Z9119 Patient's noncompliance with other medical treatment and regimen: Secondary | ICD-10-CM

## 2018-05-28 DIAGNOSIS — E1165 Type 2 diabetes mellitus with hyperglycemia: Secondary | ICD-10-CM

## 2018-05-28 DIAGNOSIS — Z91199 Patient's noncompliance with other medical treatment and regimen due to unspecified reason: Secondary | ICD-10-CM

## 2018-05-28 DIAGNOSIS — F17200 Nicotine dependence, unspecified, uncomplicated: Secondary | ICD-10-CM

## 2018-05-28 MED ORDER — AMLODIPINE BESYLATE 10 MG PO TABS: 10.0000 mg | ORAL_TABLET | Freq: Every day | ORAL | 0 refills | Status: DC

## 2018-05-28 NOTE — Progress Notes (Signed)
BP (!) 150/74 (BP Location: Left Arm, Patient Position: Sitting, Cuff Size: Normal)   Pulse 91   Temp 98.2 F (36.8 C)   Ht 5' 4.5" (1.638 m)   Wt 173 lb 4 oz (78.6 kg)   SpO2 98%   BMI 29.28 kg/m    Subjective:    Patient ID: Casey Johnston, female    DOB: 10-20-1960, 58 y.o.   MRN: [id number]  HPI: Casey Johnston is a 58 y.o. female presenting on 05/28/2018 for Follow-up   HPI   Pt did not f/u here in december as scheduled for abdominal pain.  She ended up in ER on 05/21/18   She was given rx for ondansetron and sucralfate and she says they help a little bit but not much.   She has appt with GI to f/u with that next week.   She only occasionally checks her bs at home and it is "2-something".   She did not bring bs log with her today.   She is still using 40 u lantus.  She was supposed to increase to 50 units after November OV but she didn't.    Pt has no complaints other than the abdominal pain  Pt continues to smoke.    Relevant past medical, surgical, family and social history reviewed and updated as indicated. Interim medical history since our last visit reviewed. Allergies and medications reviewed and updated.   Current Outpatient Medications:  .  amLODipine (NORVASC) 5 MG tablet, Take 1 tablet (5 mg total) by mouth daily., Disp: 90 tablet, Rfl: 1 .  atorvastatin (LIPITOR) 20 MG tablet, Take 1 tablet (20 mg total) by mouth daily., Disp: 90 tablet, Rfl: 2 .  insulin glargine (LANTUS) 100 UNIT/ML injection, Inject 40 Units into the skin at bedtime. , Disp: , Rfl:  .  lisinopril-hydrochlorothiazide (PRINZIDE,ZESTORETIC) 20-25 MG tablet, Take 1 tablet by mouth daily., Disp: 30 tablet, Rfl: 1 .  metFORMIN (GLUCOPHAGE) 1000 MG tablet, Take 1 tablet (1,000 mg total) by mouth 2 (two) times daily with a meal., Disp: 90 tablet, Rfl: 1 .  Omega-3 Fatty Acids (FISH OIL PO), Take 1 capsule by mouth daily. , Disp: , Rfl:  .  omeprazole (PRILOSEC) 40 MG capsule, Take 1 capsule  (40 mg total) by mouth daily., Disp: 90 capsule, Rfl: 1 .  ondansetron (ZOFRAN) 4 MG tablet, Take 1 tablet (4 mg total) by mouth every 6 (six) hours as needed for nausea or vomiting., Disp: 12 tablet, Rfl: 0 .  sucralfate (CARAFATE) 1 g tablet, Take 1 tablet (1 g total) by mouth 4 (four) times daily -  with meals and at bedtime., Disp: 28 tablet, Rfl: 0    Review of Systems  Constitutional: Positive for appetite change and fatigue. Negative for chills, diaphoresis, fever and unexpected weight change.  HENT: Negative for congestion, dental problem, drooling, ear pain, facial swelling, hearing loss, mouth sores, sneezing, sore throat, trouble swallowing and voice change.   Eyes: Positive for redness and itching. Negative for pain, discharge and visual disturbance.  Respiratory: Negative for cough, choking, shortness of breath and wheezing.   Cardiovascular: Negative for chest pain, palpitations and leg swelling.  Gastrointestinal: Positive for abdominal pain and constipation. Negative for blood in stool, diarrhea and vomiting.  Endocrine: Negative for cold intolerance, heat intolerance and polydipsia.  Genitourinary: Negative for decreased urine volume, dysuria and hematuria.  Musculoskeletal: Negative for arthralgias, back pain and gait problem.  Skin: Negative for rash.  Allergic/Immunologic: Negative for environmental  allergies.  Neurological: Negative for seizures, syncope, light-headedness and headaches.  Hematological: Negative for adenopathy.  Psychiatric/Behavioral: Negative for agitation, dysphoric mood and suicidal ideas. The patient is not nervous/anxious.     Per HPI unless specifically indicated above     Objective:    BP (!) 150/74 (BP Location: Left Arm, Patient Position: Sitting, Cuff Size: Normal)   Pulse 91   Temp 98.2 F (36.8 C)   Ht 5' 4.5" (1.638 m)   Wt 173 lb 4 oz (78.6 kg)   SpO2 98%   BMI 29.28 kg/m   Wt Readings from Last 3 Encounters:  05/28/18 173 lb 4  oz (78.6 kg)  05/21/18 172 lb (78 kg)  03/25/18 188 lb (85.3 kg)    Physical Exam Vitals signs reviewed.  Constitutional:      Appearance: She is well-developed.  HENT:     Head: Normocephalic and atraumatic.  Neck:     Musculoskeletal: Neck supple.  Cardiovascular:     Rate and Rhythm: Normal rate and regular rhythm.  Pulmonary:     Effort: Pulmonary effort is normal.     Breath sounds: Normal breath sounds.  Abdominal:     General: Bowel sounds are normal.     Palpations: Abdomen is soft. There is no mass.     Tenderness: There is abdominal tenderness (mild, mostly LUQ). There is no guarding or rebound.  Musculoskeletal:     Right lower leg: No edema.     Left lower leg: No edema.  Lymphadenopathy:     Cervical: No cervical adenopathy.  Skin:    General: Skin is warm and dry.  Neurological:     Mental Status: She is alert and oriented to person, place, and time.  Psychiatric:        Behavior: Behavior normal.     Results for orders placed or performed during the hospital encounter of 05/21/18  Lipase, blood  Result Value Ref Range   Lipase 34 11 - 51 U/L  Comprehensive metabolic panel  Result Value Ref Range   Sodium 139 135 - 145 mmol/L   Potassium 3.8 3.5 - 5.1 mmol/L   Chloride 102 98 - 111 mmol/L   CO2 27 22 - 32 mmol/L   Glucose, Bld 392 (H) 70 - 99 mg/dL   BUN 13 6 - 20 mg/dL   Creatinine, Ser 0.65 0.44 - 1.00 mg/dL   Calcium 9.6 8.9 - 10.3 mg/dL   Total Protein 8.0 6.5 - 8.1 g/dL   Albumin 4.2 3.5 - 5.0 g/dL   AST 16 15 - 41 U/L   ALT 13 0 - 44 U/L   Alkaline Phosphatase 77 38 - 126 U/L   Total Bilirubin 0.3 0.3 - 1.2 mg/dL   GFR calc non Af Amer >60 >60 mL/min   GFR calc Af Amer >60 >60 mL/min   Anion gap 10 5 - 15  CBC  Result Value Ref Range   WBC 11.5 (H) 4.0 - 10.5 K/uL   RBC 4.72 3.87 - 5.11 MIL/uL   Hemoglobin 14.2 12.0 - 15.0 g/dL   HCT 42.6 36.0 - 46.0 %   MCV 90.3 80.0 - 100.0 fL   MCH 30.1 26.0 - 34.0 pg   MCHC 33.3 30.0 - 36.0  g/dL   RDW 12.7 11.5 - 15.5 %   Platelets 268 150 - 400 K/uL   nRBC 0.0 0.0 - 0.2 %  Urinalysis, Routine w reflex microscopic  Result Value Ref Range   Color, Urine  YELLOW YELLOW   APPearance CLEAR CLEAR   Specific Gravity, Urine 1.028 1.005 - 1.030   pH 5.0 5.0 - 8.0   Glucose, UA >=500 (A) NEGATIVE mg/dL   Hgb urine dipstick NEGATIVE NEGATIVE   Bilirubin Urine NEGATIVE NEGATIVE   Ketones, ur NEGATIVE NEGATIVE mg/dL   Protein, ur NEGATIVE NEGATIVE mg/dL   Nitrite NEGATIVE NEGATIVE   Leukocytes, UA NEGATIVE NEGATIVE   RBC / HPF 0-5 0 - 5 RBC/hpf   WBC, UA 0-5 0 - 5 WBC/hpf   Bacteria, UA NONE SEEN NONE SEEN   Squamous Epithelial / LPF 0-5 0 - 5   Mucus PRESENT       Assessment & Plan:    Encounter Diagnoses  Name Primary?  Marland Kitchen Uncontrolled type 2 diabetes mellitus with hyperglycemia ([place]) Yes  . Essential hypertension, benign   . Hyperlipidemia, unspecified hyperlipidemia type   . Abdominal pain, unspecified abdominal location   . Tobacco use disorder   . Personal history of noncompliance with medical treatment, presenting hazards to health     -will Increase amlodpine to 10mg  -pt to Increase lantus to 50units qhs.  She is to monitor the fbs.  She is reminded to call office for fbs < 70 or > 300.   -pt to follow up with bs log in 1 month.  will update labs prio to OV.  She is to RTO sooner prn worsening or new symptoms

## 2018-06-05 ENCOUNTER — Encounter: Payer: Self-pay | Admitting: Nurse Practitioner

## 2018-06-05 ENCOUNTER — Ambulatory Visit (INDEPENDENT_AMBULATORY_CARE_PROVIDER_SITE_OTHER): Payer: Self-pay | Admitting: Nurse Practitioner

## 2018-06-05 DIAGNOSIS — R109 Unspecified abdominal pain: Secondary | ICD-10-CM | POA: Insufficient documentation

## 2018-06-05 DIAGNOSIS — R1013 Epigastric pain: Secondary | ICD-10-CM

## 2018-06-05 DIAGNOSIS — K219 Gastro-esophageal reflux disease without esophagitis: Secondary | ICD-10-CM | POA: Insufficient documentation

## 2018-06-05 DIAGNOSIS — K5900 Constipation, unspecified: Secondary | ICD-10-CM

## 2018-06-05 DIAGNOSIS — K5909 Other constipation: Secondary | ICD-10-CM | POA: Insufficient documentation

## 2018-06-05 NOTE — Progress Notes (Signed)
Primary Care Physician:  Soyla Dryer, PA-C Primary Gastroenterologist:  Dr. Oneida Alar  Chief Complaint  Patient presents with  . Abdominal Pain    luq; started after taking Januvia  . Nausea    no vomiting    HPI:   Casey Johnston is a 58 y.o. female who presents on referral from primary care for abdominal pain with a history of colon polyps.  Reviewed information provided with the referral including office visit dated 02/25/2017 with primary care.  At that time the patient noted abdominal pain persistent that "moves around."  No diarrhea or vomiting.  Plain film x-ray recently completed was unremarkable.  Previous colonoscopy completed in 2012 by Dr. Earley Brooke which had polyps.  At that time recommended referral to GI for abdominal pain.  Since her office visit when she was referred to [place] she was seen in the emergency department for idiopathic acute pancreatitis with elevated lipase at 75.  CT of the abdomen normal.  She was also seen in the ER 05/21/2018 for gastritis with epigastric pain worse postprandially.  Her presenting symptoms quickly resolved after GI cocktail and suspected NSAID gastritis.  Recommended avoid all NSAIDs, continue PPI, treat with Carafate as well.  No history of colonoscopy or endoscopy in our system.  Today she states she states she's doing better. Abdominal pain persistent in the epigastric and LUQ areas. Stopped NSAIDs, only takes Tylenol. Her abdominal pain is about the same. GI cocktail did help but didn't last. Pain is described as variable including burning. Pain is constant at this point. Denies other abdominal pain. A lot of nausea, no vomiting. Denies hematochezia, melena. Has a bowel movement about once a week, hard stools with significant straining. Denies fever, chills. Has had subjective weight loss of 40 lbs in 6 months. Objectively she is down 10 lbs in 6 months. Denies chest pain, dyspnea, dizziness, lightheadedness, syncope, near syncope. Denies  any other upper or lower GI symptoms.  Last colonoscopy 2012 at [facility].  Past Medical History:  Diagnosis Date  . Anxiety and depression   . Diabetes mellitus, type II ([place])    Dr. Dorris Fetch managing  . Frequent headaches chronic   ibuprofen helps some  . History of UTI    Recurrent per pt (approx 3 per year)  . Hyperlipidemia   . Hypertension   . Leukocytosis 12/2015   mild lymphocytosis.  Path smear review reassuring.  Repeat 01/2016 stable.  . Morbid obesity ([place])   . OSA on CPAP     Past Surgical History:  Procedure Laterality Date  . ABDOMINAL HYSTERECTOMY  1998   Dysfunctional uterine bleeding.  No hx of abnormal paps.  . COLONOSCOPY  approx 2012   Pt states it was done at [facility] by a GI MD from [place].  Recall 10 yrs.    Current Outpatient Medications  Medication Sig Dispense Refill  . amLODipine (NORVASC) 10 MG tablet Take 1 tablet (10 mg total) by mouth daily. 90 tablet 0  . atorvastatin (LIPITOR) 20 MG tablet Take 1 tablet (20 mg total) by mouth daily. 90 tablet 2  . insulin glargine (LANTUS) 100 UNIT/ML injection Inject 50 Units into the skin at bedtime.    Marland Kitchen lisinopril-hydrochlorothiazide (PRINZIDE,ZESTORETIC) 20-25 MG tablet Take 1 tablet by mouth daily. 30 tablet 1  . metFORMIN (GLUCOPHAGE) 1000 MG tablet Take 1 tablet (1,000 mg total) by mouth 2 (two) times daily with a meal. 90 tablet 1  . Omega-3 Fatty Acids (FISH OIL PO) Take  1 capsule by mouth daily.      No current facility-administered medications for this visit.     Allergies as of 06/05/2018 - Review Complete 06/05/2018  Allergen Reaction Noted  . Januvia [sitagliptin] Other (See Comments) 05/21/2018    Family History  Problem Relation Age of Onset  . Cancer Mother        ovarian  . COPD Father   . Diabetes Father   . Colon cancer Neg Hx     Social History   Socioeconomic History  . Marital status: Widowed    Spouse name: Not on file  . Number of children: Not on file  . Years of  education: Not on file  . Highest education level: Not on file  Occupational History  . Not on file  Social Needs  . Financial resource strain: Not on file  . Food insecurity:    Worry: Not on file    Inability: Not on file  . Transportation needs:    Medical: Not on file    Non-medical: Not on file  Tobacco Use  . Smoking status: Current Every Day Smoker    Packs/day: 0.50    Years: 20.00    Pack years: 10.00    Types: Cigarettes  . Smokeless tobacco: Never Used  Substance and Sexual Activity  . Alcohol use: No  . Drug use: No  . Sexual activity: Not on file  Lifestyle  . Physical activity:    Days per week: Not on file    Minutes per session: Not on file  . Stress: Not on file  Relationships  . Social connections:    Talks on phone: Not on file    Gets together: Not on file    Attends religious service: Not on file    Active member of club or organization: Not on file    Attends meetings of clubs or organizations: Not on file    Relationship status: Not on file  . Intimate partner violence:    Fear of current or ex partner: Not on file    Emotionally abused: Not on file    Physically abused: Not on file    Forced sexual activity: Not on file  Other Topics Concern  . Not on file  Social History Narrative   Separated x 20 yrs, one daughter.  She lives with her.  Great niece and nephew live with her as well.   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   Tob: 7 pack year hx, quit 2010.   Alc: none   Exercise: none    Review of Systems: Complete ROS negative except as per HPI.    Physical Exam: BP (!) 145/79   Pulse 78   Temp 99 F (37.2 C) (Oral)   Ht 5\' 5"  (1.651 m)   Wt 177 lb (80.3 kg)   BMI 29.45 kg/m  General:   Alert and oriented. Pleasant and cooperative. Well-nourished and well-developed.  Head:  Normocephalic and atraumatic. Eyes:  Without icterus, sclera clear and conjunctiva pink.  Ears:  Normal auditory acuity. Mouth:  No deformity or  lesions, oral mucosa pink.  Throat/Neck:  Supple, without mass or thyromegaly. Cardiovascular:  S1, S2 present without murmurs appreciated. Normal pulses noted. Extremities without clubbing or edema. Respiratory:  Clear to auscultation bilaterally. No wheezes, rales, or rhonchi. No distress.  Gastrointestinal:  +BS, soft, non-tender and non-distended. No HSM noted. No guarding or rebound. No masses appreciated.  Rectal:  Deferred  Musculoskalatal:  Symmetrical without gross  deformities. Normal posture. Skin:  Intact without significant lesions or rashes. Neurologic:  Alert and oriented x4;  grossly normal neurologically. Psych:  Alert and cooperative. Normal mood and affect. Heme/Lymph/Immune: No significant cervical adenopathy. No excessive bruising noted.    06/05/2018 9:06 AM   Disclaimer: This note was dictated with voice recognition software. Similar sounding words can inadvertently be transcribed and may not be corrected upon review.

## 2018-06-05 NOTE — Patient Instructions (Addendum)
Your health issues we discussed today were:   Heartburn (GERD) and abdominal pain: 1. Because of your significant symptoms we will do a breath test to check for [place] back to pylori infection 2. You need to avoid acid blockers such as Protonix for 2 weeks prior.  Fortunately you have not been on these. 3. They recommend avoiding recent Pepto-Bismol.  We will plan for the test in 1 week to give time for that to clear out. 4. If you have the infection we will treated with antibiotics.  Otherwise we will start you on Protonix 40 mg twice a day  Constipation: 1. I am giving you samples of Linzess 72 mcg.  Take 1 pill, once a day, on an empty stomach. 2. Call us in 1 to 2 weeks and let us know if it is helping her constipation.  Overall I recommend:  1. We will request her previous colonoscopy from [facility] to know when we need to do another colonoscopy. 2. Return for follow-up in 2 to 3 months. 3. Call us if you have any questions or concerns.  At [facility] we value your feedback. You may receive a survey about your visit today. Please share your experience as we strive to create trusting relationships with our patients to provide genuine, compassionate, quality care.  We appreciate your understanding and patience as we review any laboratory studies, imaging, and other diagnostic tests that are ordered as we care for you. Our office policy is 5 business days for review of these results, and any emergent or urgent results are addressed in a timely manner for your best interest. If you do not hear from our office in 1 week, please contact us.   We also encourage the use of MyChart, which contains your medical information for your review as well. If you are not enrolled in this feature, an access code is on this after visit summary for your convenience. Thank you for allowing [place] to be involved in your care.  It was great to see you today!  I hope you have a great day!!

## 2018-06-11 ENCOUNTER — Telehealth: Payer: Self-pay

## 2018-06-11 NOTE — Telephone Encounter (Signed)
Received a VM from pt. Left a message on pts machine, waiting on a return call.

## 2018-06-11 NOTE — Telephone Encounter (Signed)
Pt started Linzess 72 mcg as directed and noticed a mild improvement. Pt is still having abdominal pain but says it's moved down from where the pain first started. She has a bowel movement 1 hour after taking Linzess 72 mcg. Pt doesn't feel like the Linzess is working since she is still having pain.

## 2018-06-11 NOTE — Telephone Encounter (Signed)
Routing message 

## 2018-06-13 NOTE — Assessment & Plan Note (Signed)
The patient has constipation and typically has a bowel movement once a week.  She has ongoing abdominal pain.  Her stools are hard and require straining.  No significant bleeding.  At this point I will trial her on Linzess 72 mcg and provide samples 1 to 2 weeks and request progress report in 1 to 2 weeks.  If it is effective we can send in a prescription.  Otherwise we can titrate either the dose or change to another agent for better effect.  Follow-up in 2 to 3 months.

## 2018-06-13 NOTE — Assessment & Plan Note (Signed)
The patient is experiencing persistent epigastric and left upper quadrant pain.  She stopped NSAIDs and only taking Tylenol.  Her pain is unchanged, the GI cocktail did help but not long.  Typically described as burning and now constant.  Associated with a lot of nausea but no vomiting.  At this point I will have her check an H. pylori breath test.  After completing the breath testing start Protonix 40 mg daily.  We will call her results to her and if treatment is necessary we will provide.  Return for follow-up in 2 to 3 months.

## 2018-06-13 NOTE — Assessment & Plan Note (Signed)
Abdominal pain is generally described as burning in his epigastric/left lower quadrant.  This is consistent more with GERD especially given that GI cocktail helps, although not long.  Further management for possible GERD as above.  This will include H. pylori breath test to check for acute H. pylori infection.  Start Protonix after breath testing.  Follow-up in 2 to 3 months.

## 2018-06-16 NOTE — Progress Notes (Signed)
cc'ed to pcp °

## 2018-06-17 NOTE — Telephone Encounter (Signed)
Samples are ready for pickup. Pt is aware.  

## 2018-06-17 NOTE — Telephone Encounter (Signed)
Have her pick up samples of Linzess 145 mcg to try increased dose and see if this is any more effective.  Let me know if any questions.

## 2018-06-24 ENCOUNTER — Other Ambulatory Visit (HOSPITAL_COMMUNITY)
Admission: RE | Admit: 2018-06-24 | Discharge: 2018-06-24 | Disposition: A | Discharge status: Home / Self Care | Payer: Self-pay | Source: Ambulatory Visit | Attending: Physician Assistant | Admitting: Physician Assistant

## 2018-06-24 DIAGNOSIS — E1165 Type 2 diabetes mellitus with hyperglycemia: Secondary | ICD-10-CM | POA: Insufficient documentation

## 2018-06-24 DIAGNOSIS — E785 Hyperlipidemia, unspecified: Secondary | ICD-10-CM | POA: Insufficient documentation

## 2018-06-24 DIAGNOSIS — I10 Essential (primary) hypertension: Secondary | ICD-10-CM | POA: Insufficient documentation

## 2018-06-24 LAB — COMPREHENSIVE METABOLIC PANEL
ALT: 8 U/L (ref 0–44)
AST: 14 U/L — AB (ref 15–41)
Albumin: 3.6 g/dL (ref 3.5–5.0)
Alkaline Phosphatase: 70 U/L (ref 38–126)
Anion gap: 8 (ref 5–15)
BUN: 7 mg/dL (ref 6–20)
CO2: 28 mmol/L (ref 22–32)
Calcium: 9.2 mg/dL (ref 8.9–10.3)
Chloride: 104 mmol/L (ref 98–111)
Creatinine, Ser: 0.44 mg/dL (ref 0.44–1.00)
GFR calc Af Amer: >60 mL/min (ref >60)
Glucose, Bld: 167 mg/dL — ABNORMAL HIGH (ref 70–99)
Potassium: 3.2 mmol/L — ABNORMAL LOW (ref 3.5–5.1)
Sodium: 140 mmol/L (ref 135–145)
TOTAL PROTEIN: 7.4 g/dL (ref 6.5–8.1)
Total Bilirubin: 0.2 mg/dL — ABNORMAL LOW (ref 0.3–1.2)

## 2018-06-24 LAB — LIPID PANEL
Cholesterol: 118 mg/dL (ref 0–200)
HDL: 38 mg/dL — ABNORMAL LOW (ref >40)
LDL Cholesterol: 41 mg/dL (ref 0–99)
Total CHOL/HDL Ratio: 3.1 RATIO
Triglycerides: 194 mg/dL — ABNORMAL HIGH (ref <150)
VLDL: 39 mg/dL (ref 0–40)

## 2018-06-24 LAB — HEMOGLOBIN A1C
Hgb A1c MFr Bld: 9.3 % — ABNORMAL HIGH (ref 4.8–5.6)
Mean Plasma Glucose: 220.21 mg/dL

## 2018-06-27 ENCOUNTER — Other Ambulatory Visit (HOSPITAL_COMMUNITY)
Admission: RE | Admit: 2018-06-27 | Discharge: 2018-06-27 | Disposition: A | Discharge status: Home / Self Care | Payer: Self-pay | Source: Ambulatory Visit | Attending: Nurse Practitioner | Admitting: Nurse Practitioner

## 2018-06-27 DIAGNOSIS — K5900 Constipation, unspecified: Secondary | ICD-10-CM | POA: Insufficient documentation

## 2018-06-27 DIAGNOSIS — R1013 Epigastric pain: Secondary | ICD-10-CM | POA: Insufficient documentation

## 2018-06-27 DIAGNOSIS — K219 Gastro-esophageal reflux disease without esophagitis: Secondary | ICD-10-CM | POA: Insufficient documentation

## 2018-06-30 ENCOUNTER — Telehealth: Payer: Self-pay | Admitting: Internal Medicine

## 2018-06-30 DIAGNOSIS — R110 Nausea: Secondary | ICD-10-CM

## 2018-06-30 LAB — H. PYLORI BREATH TEST: H. pylori Breath Test: NOT DETECTED

## 2018-06-30 NOTE — Telephone Encounter (Signed)
Patient called and said she is having a different pain now, please call patient [phone]

## 2018-06-30 NOTE — Telephone Encounter (Signed)
Spoke with pt. She had her H. Pylori test yesterday as directed. Pt's pain level started increasing as of yesterday. Pt reports it's at a 7 or 8 with 10 being severe. Her nausea has also returned and she can smell things and get nauseated. Pt doesn't have nausea medication to take, which was given by another provider. Pt forgot the name of nausea medication she was taking. Pt states that she doesn't mean to call, the pain seems to be getting worse. Pt is aware that if her symptoms worsen, she should go to the ED for evaluation.

## 2018-07-02 ENCOUNTER — Encounter: Payer: Self-pay | Admitting: Physician Assistant

## 2018-07-02 ENCOUNTER — Other Ambulatory Visit: Payer: Self-pay | Admitting: Physician Assistant

## 2018-07-02 ENCOUNTER — Ambulatory Visit: Payer: Self-pay | Admitting: Physician Assistant

## 2018-07-02 VITALS — BP 152/82 | HR 87 | Temp 98.1°F | Ht 65.0 in | Wt 175.0 lb

## 2018-07-02 DIAGNOSIS — I10 Essential (primary) hypertension: Secondary | ICD-10-CM

## 2018-07-02 DIAGNOSIS — Z1239 Encounter for other screening for malignant neoplasm of breast: Secondary | ICD-10-CM

## 2018-07-02 DIAGNOSIS — F17200 Nicotine dependence, unspecified, uncomplicated: Secondary | ICD-10-CM

## 2018-07-02 DIAGNOSIS — E1165 Type 2 diabetes mellitus with hyperglycemia: Secondary | ICD-10-CM

## 2018-07-02 DIAGNOSIS — R109 Unspecified abdominal pain: Secondary | ICD-10-CM

## 2018-07-02 DIAGNOSIS — E785 Hyperlipidemia, unspecified: Secondary | ICD-10-CM

## 2018-07-02 MED ORDER — AMLODIPINE BESYLATE 10 MG PO TABS: 10.0000 mg | ORAL_TABLET | Freq: Every day | ORAL | 0 refills | Status: DC

## 2018-07-02 MED ORDER — LISINOPRIL 20 MG PO TABS: 20.0000 mg | ORAL_TABLET | Freq: Every day | ORAL | 1 refills | Status: DC

## 2018-07-02 NOTE — Progress Notes (Signed)
BP (!) 152/82 (BP Location: Right Arm, Patient Position: Sitting, Cuff Size: Normal)   Pulse 87   Temp 98.1 F (36.7 C) (Other (Comment))   Ht 5\' 5"  (1.651 m)   Wt 175 lb (79.4 kg)   SpO2 98%   BMI 29.12 kg/m    Subjective:    Patient ID: Casey Johnston, female    DOB: 03/01/1961, 58 y.o.   MRN: [id number]  HPI: Casey Johnston is a 58 y.o. female presenting on 07/02/2018 for Diabetes   HPI   amlodpine was increased at last OV but she says she never got it (it was sent to [facility]).   Pt does not have her bs log.  She says it was 188 this morning.  No lows.  She says 188 was lowest.    She is still having abd pain and nausea.  She is seeing GI for this.    Pt is still only eating once/day due to GI issues.  This makes it difficult to control her bs.   She is still smoking  Relevant past medical, surgical, family and social history reviewed and updated as indicated. Interim medical history since our last visit reviewed. Allergies and medications reviewed and updated.   Current Outpatient Medications:  .  atorvastatin (LIPITOR) 20 MG tablet, Take 1 tablet (20 mg total) by mouth daily., Disp: 90 tablet, Rfl: 2 .  insulin glargine (LANTUS) 100 UNIT/ML injection, Inject 50 Units into the skin at bedtime., Disp: , Rfl:  .  linaCLOtide (LINZESS PO), Take by mouth., Disp: , Rfl:  .  lisinopril-hydrochlorothiazide (PRINZIDE,ZESTORETIC) 20-25 MG tablet, Take 1 tablet by mouth daily., Disp: 30 tablet, Rfl: 1 .  metFORMIN (GLUCOPHAGE) 1000 MG tablet, Take 1 tablet (1,000 mg total) by mouth 2 (two) times daily with a meal., Disp: 90 tablet, Rfl: 1 .  Omega-3 Fatty Acids (FISH OIL PO), Take 1 capsule by mouth daily. , Disp: , Rfl:  .  amLODipine (NORVASC) 10 MG tablet, Take 1 tablet (10 mg total) by mouth daily. (Patient not taking: Reported on 07/02/2018), Disp: 90 tablet, Rfl: 0   Review of Systems  Constitutional: Positive for appetite change and fatigue. Negative for chills,  diaphoresis, fever and unexpected weight change.  HENT: Negative for congestion, drooling, ear pain, facial swelling, hearing loss, mouth sores, sneezing, sore throat, trouble swallowing and voice change.   Eyes: Positive for redness and itching. Negative for pain, discharge and visual disturbance.  Respiratory: Negative for cough, choking, shortness of breath and wheezing.   Cardiovascular: Negative for chest pain, palpitations and leg swelling.  Gastrointestinal: Positive for abdominal pain and constipation. Negative for blood in stool, diarrhea and vomiting.  Endocrine: Negative for cold intolerance, heat intolerance and polydipsia.  Genitourinary: Negative for decreased urine volume, dysuria and hematuria.  Musculoskeletal: Negative for arthralgias, back pain and gait problem.  Skin: Negative for rash.  Allergic/Immunologic: Negative for environmental allergies.  Neurological: Negative for seizures, syncope, light-headedness and headaches.  Hematological: Negative for adenopathy.  Psychiatric/Behavioral: Negative for agitation, dysphoric mood and suicidal ideas. The patient is not nervous/anxious.     Per HPI unless specifically indicated above     Objective:    BP (!) 152/82 (BP Location: Right Arm, Patient Position: Sitting, Cuff Size: Normal)   Pulse 87   Temp 98.1 F (36.7 C) (Other (Comment))   Ht 5\' 5"  (1.651 m)   Wt 175 lb (79.4 kg)   SpO2 98%   BMI 29.12 kg/m   Wt  Readings from Last 3 Encounters:  07/02/18 175 lb (79.4 kg)  06/05/18 177 lb (80.3 kg)  05/28/18 173 lb 4 oz (78.6 kg)    Physical Exam Vitals signs reviewed.  Constitutional:      Appearance: She is well-developed.  HENT:     Head: Normocephalic and atraumatic.  Neck:     Musculoskeletal: Neck supple.  Cardiovascular:     Rate and Rhythm: Normal rate and regular rhythm.  Pulmonary:     Effort: Pulmonary effort is normal.     Breath sounds: Normal breath sounds.  Abdominal:     General: Bowel  sounds are normal.     Palpations: Abdomen is soft. There is no mass.     Tenderness: There is no abdominal tenderness.  Lymphadenopathy:     Cervical: No cervical adenopathy.  Skin:    General: Skin is warm and dry.  Neurological:     Mental Status: She is alert and oriented to person, place, and time.  Psychiatric:        Behavior: Behavior normal.     Results for orders placed or performed during the hospital encounter of 06/24/18  Hemoglobin A1c  Result Value Ref Range   Hgb A1c MFr Bld 9.3 (H) 4.8 - 5.6 %   Mean Plasma Glucose 220.21 mg/dL  Lipid panel  Result Value Ref Range   Cholesterol 118 0 - 200 mg/dL   Triglycerides 194 (H) <150 mg/dL   HDL 38 (L) >40 mg/dL   Total CHOL/HDL Ratio 3.1 RATIO   VLDL 39 0 - 40 mg/dL   LDL Cholesterol 41 0 - 99 mg/dL  Comprehensive metabolic panel  Result Value Ref Range   Sodium 140 135 - 145 mmol/L   Potassium 3.2 (L) 3.5 - 5.1 mmol/L   Chloride 104 98 - 111 mmol/L   CO2 28 22 - 32 mmol/L   Glucose, Bld 167 (H) 70 - 99 mg/dL   BUN 7 6 - 20 mg/dL   Creatinine, Ser 0.44 0.44 - 1.00 mg/dL   Calcium 9.2 8.9 - 10.3 mg/dL   Total Protein 7.4 6.5 - 8.1 g/dL   Albumin 3.6 3.5 - 5.0 g/dL   AST 14 (L) 15 - 41 U/L   ALT 8 0 - 44 U/L   Alkaline Phosphatase 70 38 - 126 U/L   Total Bilirubin 0.2 (L) 0.3 - 1.2 mg/dL   GFR calc non Af Amer >60 >60 mL/min   GFR calc Af Amer >60 >60 mL/min   Anion gap 8 5 - 15      Assessment & Plan:   Encounter Diagnoses  Name Primary?  Marland Kitchen Uncontrolled type 2 diabetes mellitus with hyperglycemia ([place]) Yes  . Essential hypertension, benign   . Hyperlipidemia, unspecified hyperlipidemia type   . Screening for breast cancer   . Abdominal pain, unspecified abdominal location   . Tobacco use disorder      -reviewed labs with pt -Will order screening mammogram -pt is on list for DM eye exam -Pt to make calls (to [facility]) if she hasn't gottten new meds in a week.  -will increase insulin to 55 u.  Pt  to monitor bs and call office for fbs < 70 or > 300 -D/c lisinopril/hctz and start lisinopril (due to hypokalemia) -pt to follow up 1 month to recheck blood pressure and DM.  RTO sooner prn

## 2018-07-03 MED ORDER — ONDANSETRON HCL 4 MG PO TABS: 4.0000 mg | ORAL_TABLET | Freq: Three times a day (TID) | ORAL | 1 refills | Status: DC | PRN

## 2018-07-03 NOTE — Addendum Note (Signed)
Addended by: Gordy Levan, ERIC A on: 07/03/2018 03:50 PM   Modules accepted: Orders

## 2018-07-03 NOTE — Telephone Encounter (Signed)
I will send in Zofran to her pharmacy to help. See H. Pylori test result note.

## 2018-07-04 ENCOUNTER — Telehealth: Payer: Self-pay

## 2018-07-04 NOTE — Telephone Encounter (Signed)
EG, pt is inquiring about the Protonix Rx and would like it sent to her pharmacy.

## 2018-07-04 NOTE — Telephone Encounter (Signed)
Noted. Documented in result note.

## 2018-07-07 ENCOUNTER — Other Ambulatory Visit: Payer: Self-pay | Admitting: Nurse Practitioner

## 2018-07-07 DIAGNOSIS — K219 Gastro-esophageal reflux disease without esophagitis: Secondary | ICD-10-CM

## 2018-07-07 MED ORDER — PANTOPRAZOLE SODIUM 40 MG PO TBEC: 40.0000 mg | DELAYED_RELEASE_TABLET | Freq: Two times a day (BID) | ORAL | 3 refills | Status: DC

## 2018-07-08 ENCOUNTER — Other Ambulatory Visit: Payer: Self-pay | Admitting: Physician Assistant

## 2018-07-08 DIAGNOSIS — Z1239 Encounter for other screening for malignant neoplasm of breast: Secondary | ICD-10-CM

## 2018-07-09 ENCOUNTER — Other Ambulatory Visit: Payer: Self-pay | Admitting: Physician Assistant

## 2018-07-09 MED ORDER — METFORMIN HCL 1000 MG PO TABS: 1000.0000 mg | ORAL_TABLET | Freq: Two times a day (BID) | ORAL | 1 refills | Status: DC

## 2018-07-23 ENCOUNTER — Ambulatory Visit (HOSPITAL_COMMUNITY)
Admission: RE | Admit: 2018-07-23 | Discharge: 2018-07-23 | Disposition: A | Discharge status: Home / Self Care | Payer: Self-pay | Source: Ambulatory Visit | Attending: Physician Assistant | Admitting: Physician Assistant

## 2018-07-23 DIAGNOSIS — Z1239 Encounter for other screening for malignant neoplasm of breast: Secondary | ICD-10-CM | POA: Insufficient documentation

## 2018-07-30 ENCOUNTER — Encounter: Payer: Self-pay | Admitting: *Deleted

## 2018-07-30 ENCOUNTER — Encounter: Payer: Self-pay | Admitting: Nurse Practitioner

## 2018-07-30 ENCOUNTER — Ambulatory Visit (INDEPENDENT_AMBULATORY_CARE_PROVIDER_SITE_OTHER): Payer: Self-pay | Admitting: Nurse Practitioner

## 2018-07-30 ENCOUNTER — Other Ambulatory Visit: Payer: Self-pay | Admitting: *Deleted

## 2018-07-30 ENCOUNTER — Telehealth: Payer: Self-pay

## 2018-07-30 VITALS — BP 157/84 | HR 90 | Temp 97.4°F | Ht 65.0 in | Wt 172.6 lb

## 2018-07-30 DIAGNOSIS — K5900 Constipation, unspecified: Secondary | ICD-10-CM

## 2018-07-30 DIAGNOSIS — R634 Abnormal weight loss: Secondary | ICD-10-CM | POA: Insufficient documentation

## 2018-07-30 DIAGNOSIS — R1013 Epigastric pain: Secondary | ICD-10-CM

## 2018-07-30 DIAGNOSIS — K219 Gastro-esophageal reflux disease without esophagitis: Secondary | ICD-10-CM

## 2018-07-30 DIAGNOSIS — R110 Nausea: Secondary | ICD-10-CM

## 2018-07-30 MED ORDER — TRAMADOL HCL 50 MG PO TABS: 25.0000 mg | ORAL_TABLET | Freq: Four times a day (QID) | ORAL | 0 refills | Status: DC | PRN

## 2018-07-30 MED ORDER — LINACLOTIDE 72 MCG PO CAPS: 72.0000 ug | ORAL_CAPSULE | Freq: Every day | ORAL | 5 refills | Status: DC

## 2018-07-30 NOTE — Patient Instructions (Signed)
Your health issues we discussed today were:   Constipation: 1. I have sent in a new prescription for Linzess 72 mcg.  Take this once a day to have regular, daily bowel movements 2. Call us if you have any worsening constipation  GERD (heartburn): 1. Continue taking Protonix twice daily as this seems to be working well for you 2. Call us if you have any worsening or recurrent heartburn symptoms  Persistent abdominal pain and weight loss: 1. Given your persistent abdominal pain and weight loss, despite better controlling your constipation and heartburn, we will proceed with a colonoscopy with a possible upper endoscopy 2. Further recommendations will follow your procedures  Overall I recommend:  1. Return for follow-up in 4 months 2. Call us if you have any questions or concerns.  At [facility] we value your feedback. You may receive a survey about your visit today. Please share your experience as we strive to create trusting relationships with our patients to provide genuine, compassionate, quality care.  We appreciate your understanding and patience as we review any laboratory studies, imaging, and other diagnostic tests that are ordered as we care for you. Our office policy is 5 business days for review of these results, and any emergent or urgent results are addressed in a timely manner for your best interest. If you do not hear from our office in 1 week, please contact us.   We also encourage the use of MyChart, which contains your medical information for your review as well. If you are not enrolled in this feature, an access code is on this after visit summary for your convenience. Thank you for allowing [place] to be involved in your care.  It was great to see you today!  I hope you have a great day!!

## 2018-07-30 NOTE — Assessment & Plan Note (Signed)
The patient initially presented with abdominal pain and this continues today although it seems to be variable in location.  Today it is left upper quadrant and radiates down the left lower quadrant.  When she presented she was also having symptomatic constipation and GERD.  As per above, these have both been under much better control.  However, abdominal pain persists.  She did have a recent CT at the end of 2019 which was essentially unremarkable.  Received her previous colonoscopy report which was dated 12/01/2010 which found a 1.5 cm pedunculated polyp as well as 2 additional diminutive polyps.  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  Recommended repeat colonoscopy in 3 years (2015).  She is significantly overdue at this point.  Given her persistent abdominal pain despite control of her better symptoms, previous recommendations from previous colonoscopy we will proceed with colonoscopy with possible upper endoscopy at this time.  Follow-up in 4 months in the office.

## 2018-07-30 NOTE — Assessment & Plan Note (Signed)
GERD symptoms now controlled/resolved with no breakthrough recently on Protonix 40 mg twice a day.  Recommend she continue her current medications for now.  Follow-up in 4 months.

## 2018-07-30 NOTE — Progress Notes (Signed)
CC'ED TO PCP 

## 2018-07-30 NOTE — Addendum Note (Signed)
Addended by: Gordy Levan, ERIC A on: 07/30/2018 09:21 AM   Modules accepted: Orders

## 2018-07-30 NOTE — Assessment & Plan Note (Signed)
The patient has daily nausea which is generally mild in the setting of known GERD which is under better control.  Given her persistent nausea as well as weight loss and abdominal pain, as per below we are scheduling a colonoscopy and upper endoscopy.  Follow-up in 4 months.

## 2018-07-30 NOTE — Telephone Encounter (Signed)
T/C from [facility] at [facility] in [place]:  Needs diagnosis for the Tramadol and I told her abdominal pain.  She said that since pt has not had narcotics before they will have to limit her to 5 days and if she needs more after that it is Ok to send in another prescription.   Forwarding to Walden Field, NP who saw pt today and prescribed it.

## 2018-07-30 NOTE — Assessment & Plan Note (Signed)
The patient previously described subjective weight loss of 40 pounds in 6 months.  Objectively it was found to be 10 pounds weight loss in 6 months.  Today she is down an additional 5 pounds.  No change in appetite.  Persistent abdominal pain and nausea despite better control of GERD and constipation.  Previous colonoscopy with tubulovillous adenoma in 2012 and recommended repeat in 2015 so she is currently about 5 years overdue.  No family history of colon cancer.  Given her unintentional weight loss, persistent abdominal pain, and ongoing nausea we will plan for colonoscopy with possible upper endoscopy.  Follow-up in 4 months.  Proceed with colonoscopy +/- EGD with Dr. Oneida Alar in the near future. The risks, benefits, and alternatives have been discussed in detail with the patient. They state understanding and desire to proceed.   The patient is not on any anticoagulants, anxiolytics, chronic pain medications, or antidepressants.  Conscious sedation should be adequate for her procedure.

## 2018-07-30 NOTE — Progress Notes (Signed)
Referring Provider: Soyla Dryer, PA-C Primary Care Physician:  Soyla Dryer, PA-C Primary GI:  Dr. Oneida Alar  Chief Complaint  Patient presents with  . Gastroesophageal Reflux  . Constipation    HPI:   Casey Johnston is a 58 y.o. female who presents for follow-up on GERD and constipation.  The patient was last seen in our office 06/05/2018 for the same.  Previous colonoscopy 2012 by Dr. Elenor Legato which had polyps.  Previous admission to the hospital for idiopathic acute pancreatitis and elevated lipase at 75 with a normal CT abdomen.  Previously in the emergency department symptoms consistent with and suspected NSAID gastritis.  At her last visit she was having some epigastric and left upper quadrant pain, stopped all NSAIDs.  Pain constant.  Nausea but no vomiting.  Significant constipation with bowel movement once a week and straining.  Subjective weight loss of 40 pounds in 6 months but objectively down 10 pounds in 6 months.  Recommended Linzess 72 mcg daily with a progress report, H. pylori breath test, after testing start Protonix 40 mg daily.  Recommended request previous colonoscopy report from [place]. pylori breath test completed 06/27/2018 and negative.  Protonix was sent to her pharmacy as was Zofran.  The patient called our office noting mild improvement on Linzess 72 mcg and recommended she pick up samples of Linzess 145 mcg.  Colonoscopy report is yet to be received from [facility].  Today she states she's doing ok overall. When she had Linzess 72 mcg she took it daily and had a bowel movement. On 145 mcg she only takes it once a week because of "over doing it." GERD symptoms doing well, on Protonix bid. No breakthrough. Constant abdominal pain LUQ which moves to LLQ, described as "constant pain; varies between quality from dull to intermittent stabbing pain." Starts upon waking and lingers on; a couple of times it has woken her from sleep; laying flat  helps at times. Denies hematochezia. Has had some black stools twice, last episode a week or two ago, occurs with initial bowel movement of the day and gets lighter later in the day. Denies weakness, fatigue. Still with nausea, no vomiting; 2-3 times a week and lasts several hours. Zofran generally not helpful, thinks she may have done better on Phenergan. Denies fever, chills. States she's maintaining weight. Objectively she has lost another 5 lbs.   Last colonoscopy was over 10 years ago. She states Dr. Larene Beach at the free clinic is setting one up for her (some sort of program where she can have those done by [facility]).   Past Medical History:  Diagnosis Date  . Anxiety and depression   . Diabetes mellitus, type II ([place])    Dr. Dorris Fetch managing  . Frequent headaches chronic   ibuprofen helps some  . History of UTI    Recurrent per pt (approx 3 per year)  . Hyperlipidemia   . Hypertension   . Leukocytosis 12/2015   mild lymphocytosis.  Path smear review reassuring.  Repeat 01/2016 stable.  . Morbid obesity ([place])   . OSA on CPAP     Past Surgical History:  Procedure Laterality Date  . ABDOMINAL HYSTERECTOMY  1998   Dysfunctional uterine bleeding.  No hx of abnormal paps.  . COLONOSCOPY  approx 2012   Pt states it was done at [facility] by a GI MD from [place].  Recall 10 yrs.    Current Outpatient Medications  Medication Sig Dispense Refill  .  amLODipine (NORVASC) 10 MG tablet Take 1 tablet (10 mg total) by mouth daily. 90 tablet 0  . atorvastatin (LIPITOR) 20 MG tablet Take 1 tablet (20 mg total) by mouth daily. 90 tablet 2  . insulin glargine (LANTUS) 100 UNIT/ML injection Inject 55 Units into the skin at bedtime.    Marland Kitchen linaCLOtide (LINZESS PO) Take by mouth once a week. Linzess 142mcg    . lisinopril (PRINIVIL,ZESTRIL) 20 MG tablet Take 1 tablet (20 mg total) by mouth daily. 90 tablet 1  . metFORMIN (GLUCOPHAGE) 1000 MG tablet Take 1 tablet (1,000 mg total) by mouth 2 (two) times daily  with a meal. 90 tablet 1  . Omega-3 Fatty Acids (FISH OIL PO) Take 1 capsule by mouth daily.     . pantoprazole (PROTONIX) 40 MG tablet Take 1 tablet (40 mg total) by mouth 2 (two) times daily before a meal. 60 tablet 3   No current facility-administered medications for this visit.     Allergies as of 07/30/2018 - Review Complete 07/30/2018  Allergen Reaction Noted  . Januvia [sitagliptin] Other (See Comments) 05/21/2018    Family History  Problem Relation Age of Onset  . Cancer Mother        ovarian  . COPD Father   . Diabetes Father   . Colon cancer Neg Hx     Social History   Socioeconomic History  . Marital status: Widowed    Spouse name: Not on file  . Number of children: Not on file  . Years of education: Not on file  . Highest education level: Not on file  Occupational History  . Not on file  Social Needs  . Financial resource strain: Not on file  . Food insecurity:    Worry: Not on file    Inability: Not on file  . Transportation needs:    Medical: Not on file    Non-medical: Not on file  Tobacco Use  . Smoking status: Current Every Day Smoker    Packs/day: 0.50    Years: 20.00    Pack years: 10.00    Types: Cigarettes  . Smokeless tobacco: Never Used  Substance and Sexual Activity  . Alcohol use: No  . Drug use: No  . Sexual activity: Not on file  Lifestyle  . Physical activity:    Days per week: Not on file    Minutes per session: Not on file  . Stress: Not on file  Relationships  . Social connections:    Talks on phone: Not on file    Gets together: Not on file    Attends religious service: Not on file    Active member of club or organization: Not on file    Attends meetings of clubs or organizations: Not on file    Relationship status: Not on file  Other Topics Concern  . Not on file  Social History Narrative   Separated x 20 yrs, one daughter.  She lives with her.  Great niece and nephew live with her as well.   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   Tob: 7 pack year hx, quit 2010.   Alc: none   Exercise: none    Review of Systems: Complete ROS negative except as per HPI.  Physical Exam: BP (!) 157/84   Pulse 90   Temp (!) 97.4 F (36.3 C) (Oral)   Ht 5\' 5"  (1.651 m)   Wt 172 lb 9.6 oz (78.3 kg)   BMI 28.72 kg/m  General:  Alert and oriented. Pleasant and cooperative. Well-nourished and well-developed.  Eyes:  Without icterus, sclera clear and conjunctiva pink.  Ears:  Normal auditory acuity. Cardiovascular:  S1, S2 present without murmurs appreciated. Normal pulses noted. Extremities without clubbing or edema. Respiratory:  Clear to auscultation bilaterally. No wheezes, rales, or rhonchi. No distress.  Gastrointestinal:  +BS, soft, non-tender and non-distended. No HSM noted. No guarding or rebound. No masses appreciated.  Rectal:  Deferred  Musculoskalatal:  Symmetrical without gross deformities. Skin:  Intact without significant lesions or rashes. Neurologic:  Alert and oriented x4;  grossly normal neurologically. Psych:  Alert and cooperative. Normal mood and affect. Heme/Lymph/Immune: No excessive bruising noted.    07/30/2018 8:42 AM   Disclaimer: This note was dictated with voice recognition software. Similar sounding words can inadvertently be transcribed and may not be corrected upon review.

## 2018-07-30 NOTE — Assessment & Plan Note (Signed)
Constipation significantly improved on Linzess.  She was previously given low-dose Linzess 72 mcg daily.  She requested an increase in dosing to 145 mcg daily.  Today she states when she was on 72 mcg she was having a bowel movement once a day and felt this was not effective.  On 145 mcg she only takes it once a week.  Overall constipation is better but not optimal.  Recommended she go back to 72 mcg and take it once a day that 1 bowel movement a day is acceptable if she is not having further constipation symptoms.  She agreed.  New prescription sent to the pharmacy.  Follow-up in 4 months.

## 2018-07-31 ENCOUNTER — Other Ambulatory Visit: Payer: Self-pay

## 2018-07-31 ENCOUNTER — Encounter: Payer: Self-pay | Admitting: Physician Assistant

## 2018-07-31 ENCOUNTER — Ambulatory Visit: Payer: Self-pay | Admitting: Physician Assistant

## 2018-07-31 VITALS — BP 144/70 | HR 84 | Temp 97.9°F | Ht 65.0 in | Wt 172.5 lb

## 2018-07-31 DIAGNOSIS — E1165 Type 2 diabetes mellitus with hyperglycemia: Secondary | ICD-10-CM

## 2018-07-31 DIAGNOSIS — E876 Hypokalemia: Secondary | ICD-10-CM

## 2018-07-31 DIAGNOSIS — F17200 Nicotine dependence, unspecified, uncomplicated: Secondary | ICD-10-CM

## 2018-07-31 DIAGNOSIS — E785 Hyperlipidemia, unspecified: Secondary | ICD-10-CM

## 2018-07-31 DIAGNOSIS — I10 Essential (primary) hypertension: Secondary | ICD-10-CM

## 2018-07-31 MED ORDER — LISINOPRIL 20 MG PO TABS: 40.0000 mg | ORAL_TABLET | Freq: Every day | ORAL | 1 refills | Status: DC

## 2018-07-31 NOTE — Telephone Encounter (Signed)
Noted  

## 2018-07-31 NOTE — Progress Notes (Signed)
BP (!) 144/70 (BP Location: Left Arm, Patient Position: Sitting, Cuff Size: Normal)   Pulse 84   Temp 97.9 F (36.6 C)   Ht 5\' 5"  (1.651 m)   Wt 172 lb 8 oz (78.2 kg)   SpO2 98%   BMI 28.71 kg/m    Subjective:    Patient ID: Casey Johnston, female    DOB: 17-Feb-1961, 58 y.o.   MRN: [id number]  HPI: Casey Johnston is a 58 y.o. 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    Pt does report some stress and anxiety - she doesn't want prescription for this.  No SI or HI.   She has bs log with range 118-188.   Relevant past medical, surgical, family and social history reviewed and updated as indicated. Interim medical history since our last visit reviewed. Allergies and medications reviewed and updated.   Current Outpatient Medications:  .  amLODipine (NORVASC) 10 MG tablet, Take 1 tablet (10 mg total) by mouth daily., Disp: 90 tablet, Rfl: 0 .  atorvastatin (LIPITOR) 20 MG tablet, Take 1 tablet (20 mg total) by mouth daily., Disp: 90 tablet, Rfl: 2 .  insulin glargine (LANTUS) 100 UNIT/ML injection, Inject 55 Units into the skin at bedtime., Disp: , Rfl:  .  linaclotide (LINZESS) 72 MCG capsule, Take 1 capsule (72 mcg total) by mouth daily before breakfast., Disp: 30 capsule, Rfl: 5 .  lisinopril (PRINIVIL,ZESTRIL) 20 MG tablet, Take 1 tablet (20 mg total) by mouth daily., Disp: 90 tablet, Rfl: 1 .  metFORMIN (GLUCOPHAGE) 1000 MG tablet, Take 1 tablet (1,000 mg total) by mouth 2 (two) times daily with a meal., Disp: 90 tablet, Rfl: 1 .  Omega-3 Fatty Acids (FISH OIL PO), Take 1 capsule by mouth daily. , Disp: , Rfl:  .  pantoprazole (PROTONIX) 40 MG tablet, Take 1 tablet (40 mg total) by mouth 2 (two) times daily before a meal., Disp: 60 tablet, Rfl:  3 .  traMADol (ULTRAM) 50 MG tablet, Take 0.5 tablets (25 mg total) by mouth every 6 (six) hours as needed., Disp: 30 tablet, Rfl: 0   Review of Systems  Constitutional: Negative for appetite change, chills, diaphoresis, fatigue, fever and unexpected weight change.  HENT: Negative for congestion, dental problem, drooling, ear pain, facial swelling, hearing loss, mouth sores, sneezing, sore throat, trouble swallowing and voice change.   Eyes: Negative for pain, discharge, redness, itching and visual disturbance.  Respiratory: Negative for cough, choking, shortness of breath and wheezing.   Cardiovascular: Negative for chest pain, palpitations and leg swelling.  Gastrointestinal: Positive for abdominal pain. Negative for blood in stool, constipation, diarrhea and vomiting.  Endocrine: Negative for cold intolerance, heat intolerance and polydipsia.  Genitourinary: Negative for decreased urine volume, dysuria and hematuria.  Musculoskeletal: Negative for arthralgias, back pain and gait problem.  Skin: Negative for rash.  Allergic/Immunologic: Negative for environmental allergies.  Neurological: Negative for seizures, syncope, light-headedness and headaches.  Hematological: Negative for adenopathy.  Psychiatric/Behavioral: Negative for agitation, dysphoric mood and suicidal ideas. The patient is not nervous/anxious.     Per HPI unless specifically indicated above     Objective:    BP (!) 144/70 (BP Location: Left Arm, Patient Position: Sitting, Cuff  Size: Normal)   Pulse 84   Temp 97.9 F (36.6 C)   Ht 5\' 5"  (1.651 m)   Wt 172 lb 8 oz (78.2 kg)   SpO2 98%   BMI 28.71 kg/m   Wt Readings from Last 3 Encounters:  07/31/18 172 lb 8 oz (78.2 kg)  07/30/18 172 lb 9.6 oz (78.3 kg)  07/02/18 175 lb (79.4 kg)    Physical Exam Vitals signs reviewed.  Constitutional:      Appearance: She is well-developed.  HENT:     Head: Normocephalic and atraumatic.  Neck:     Musculoskeletal: Neck  supple.  Cardiovascular:     Rate and Rhythm: Normal rate and regular rhythm.  Pulmonary:     Effort: Pulmonary effort is normal.     Breath sounds: Normal breath sounds.  Abdominal:     General: Bowel sounds are normal.     Palpations: Abdomen is soft. There is no mass.     Tenderness: There is no abdominal tenderness.  Lymphadenopathy:     Cervical: No cervical adenopathy.  Skin:    General: Skin is warm and dry.  Neurological:     Mental Status: She is alert and oriented to person, place, and time.  Psychiatric:        Behavior: Behavior normal.     Results for orders placed or performed during the hospital encounter of 06/24/18  Hemoglobin A1c  Result Value Ref Range   Hgb A1c MFr Bld 9.3 (H) 4.8 - 5.6 %   Mean Plasma Glucose 220.21 mg/dL  Lipid panel  Result Value Ref Range   Cholesterol 118 0 - 200 mg/dL   Triglycerides 194 (H) <150 mg/dL   HDL 38 (L) >40 mg/dL   Total CHOL/HDL Ratio 3.1 RATIO   VLDL 39 0 - 40 mg/dL   LDL Cholesterol 41 0 - 99 mg/dL  Comprehensive metabolic panel  Result Value Ref Range   Sodium 140 135 - 145 mmol/L   Potassium 3.2 (L) 3.5 - 5.1 mmol/L   Chloride 104 98 - 111 mmol/L   CO2 28 22 - 32 mmol/L   Glucose, Bld 167 (H) 70 - 99 mg/dL   BUN 7 6 - 20 mg/dL   Creatinine, Ser 0.44 0.44 - 1.00 mg/dL   Calcium 9.2 8.9 - 10.3 mg/dL   Total Protein 7.4 6.5 - 8.1 g/dL   Albumin 3.6 3.5 - 5.0 g/dL   AST 14 (L) 15 - 41 U/L   ALT 8 0 - 44 U/L   Alkaline Phosphatase 70 38 - 126 U/L   Total Bilirubin 0.2 (L) 0.3 - 1.2 mg/dL   GFR calc non Af Amer >60 >60 mL/min   GFR calc Af Amer >60 >60 mL/min   Anion gap 8 5 - 15      Assessment & Plan:    Encounter Diagnoses  Name Primary?  Marland Kitchen Uncontrolled type 2 diabetes mellitus with hyperglycemia ([place]) Yes  . Essential hypertension, benign   . Hyperlipidemia, unspecified hyperlipidemia type   . Hypokalemia   . Tobacco use disorder      -Check bmp today- pt to go to lab when leaves  office  -will Increase lantus to 60units.  Pt to continue to monitor bs and call office for fbs < 70 or > 300  -pt Will take 2 lisinopril daily to improve bp  -pt to continue with GI per their recommendations   -counseled smoking cessation  -pt to follow up 1 month to  recheck bs and bp.  Pt to RTO sooner prn

## 2018-07-31 NOTE — Telephone Encounter (Signed)
Noted. Let's see if the patient requests more (if it was effective) and we can send in more at that point.

## 2018-08-18 ENCOUNTER — Telehealth: Payer: Self-pay

## 2018-08-18 NOTE — Telephone Encounter (Signed)
Does she feel better after BM? I reviewed the prior office visit. Looks like higher dosages of Linzess were too strong. Can we verify that? If so, we could trial Amitiza 24 mcg BID with food. She has imaging on file from Oct 2019. Further input or additional recommendations per Randall Hiss Thursday.

## 2018-08-18 NOTE — Telephone Encounter (Signed)
Pt said she has pain on her left side, It will start sometimes in the middle of her stomach and go to the left side. Sometimes radiates to her back. She said she is having about 2-3 BM's weekly on the Linzess 72 mcg.  She is aware her next appt has been moved from June to April 15th at 1:30 pm with Walden Field, NP. The pain is worse in the day time. She rates the pain at about a 5 now. Said she doesn't want narcotics but would like to know how she can have relief. Forwarding to [name], NP to advise in Eric's absence.

## 2018-08-18 NOTE — Telephone Encounter (Signed)
Pt said she does feel a little better after a BM. The larger doses of Linzess were too strong. She does not have any insurance so prefers not to have Rx sent in. She has never tried Miralax. Per [name], NP she can try a dose of Miralax on days that she does not have a BM.  Pt is aware and will try and let us know how it does.

## 2018-08-22 NOTE — Telephone Encounter (Signed)
Noted  

## 2018-09-03 ENCOUNTER — Ambulatory Visit: Payer: Self-pay | Admitting: Nurse Practitioner

## 2018-09-09 ENCOUNTER — Encounter: Payer: Self-pay | Admitting: Physician Assistant

## 2018-09-09 ENCOUNTER — Ambulatory Visit: Payer: Self-pay | Admitting: Physician Assistant

## 2018-09-09 DIAGNOSIS — E119 Type 2 diabetes mellitus without complications: Secondary | ICD-10-CM

## 2018-09-09 DIAGNOSIS — F17200 Nicotine dependence, unspecified, uncomplicated: Secondary | ICD-10-CM

## 2018-09-09 DIAGNOSIS — E785 Hyperlipidemia, unspecified: Secondary | ICD-10-CM

## 2018-09-09 DIAGNOSIS — I10 Essential (primary) hypertension: Secondary | ICD-10-CM

## 2018-09-09 DIAGNOSIS — R109 Unspecified abdominal pain: Secondary | ICD-10-CM

## 2018-09-09 NOTE — Progress Notes (Signed)
There were no vitals taken for this visit.   Subjective:    Patient ID: Casey Johnston, female    DOB: Oct 30, 1960, 58 y.o.   MRN: [id number]  HPI: [name] is a 58 y.o. female presenting on 09/09/2018 for No chief complaint on file.   HPI   This is a telemedicine visit through [employer] due to the coronavirus pandemic.  I connected with  Casey Johnston on 09/09/18 by a video enabled telemedicine application and verified that I am speaking with the correct person using two identifiers.   I discussed the limitations of evaluation and management by telemedicine. The patient expressed understanding and agreed to proceed.   Pt is currently Using 60units lantus.  She says her blood sugar is running mostly in the 90s.    She is still smoking.     She is home.  She says she has been mostly staying home per CV19 recommendations.   She is maintining her weight with activity.  She does admit to Not a lot of activity.   She says she has no feet ulcers.    She says her Mood is overall good.    Pt is still having GI symtpoms - she is scheduled for enndoscopy 10/20/18.  She says her only complaint is the persistent GI issues.    Relevant past medical, surgical, family and social history reviewed and updated as indicated. Interim medical history since our last visit reviewed. Allergies and medications reviewed and updated.   Current Outpatient Medications:  .  amLODipine (NORVASC) 10 MG tablet, Take 1 tablet (10 mg total) by mouth daily., Disp: 90 tablet, Rfl: 0 .  atorvastatin (LIPITOR) 20 MG tablet, Take 1 tablet (20 mg total) by mouth daily., Disp: 90 tablet, Rfl: 2 .  insulin glargine (LANTUS) 100 UNIT/ML injection, Inject 55 Units into the skin at bedtime., Disp: , Rfl:  .  linaclotide (LINZESS) 72 MCG capsule, Take 1 capsule (72 mcg total) by mouth daily before breakfast., Disp: 30 capsule, Rfl: 5 .  lisinopril (PRINIVIL,ZESTRIL) 20 MG tablet, Take 2 tablets (40 mg total)  by mouth daily., Disp: 180 tablet, Rfl: 1 .  metFORMIN (GLUCOPHAGE) 1000 MG tablet, Take 1 tablet (1,000 mg total) by mouth 2 (two) times daily with a meal., Disp: 90 tablet, Rfl: 1 .  Omega-3 Fatty Acids (FISH OIL PO), Take 1 capsule by mouth daily. , Disp: , Rfl:  .  pantoprazole (PROTONIX) 40 MG tablet, Take 1 tablet (40 mg total) by mouth 2 (two) times daily before a meal., Disp: 60 tablet, Rfl: 3   Review of Systems  Per HPI unless specifically indicated above     Objective:    There were no vitals taken for this visit.  Wt Readings from Last 3 Encounters:  07/31/18 172 lb 8 oz (78.2 kg)  07/30/18 172 lb 9.6 oz (78.3 kg)  07/02/18 175 lb (79.4 kg)    Physical Exam Constitutional:      General: She is not in acute distress.    Appearance: She is obese. She is not ill-appearing.  HENT:     Head: Normocephalic and atraumatic.  Pulmonary:     Effort: Pulmonary effort is normal. No respiratory distress.  Neurological:     Mental Status: She is alert and oriented to person, place, and time.  Psychiatric:        Mood and Affect: Mood normal.        Thought Content: Thought content normal.  Assessment & Plan:    Encounter Diagnoses  Name Primary?  . Diabetes mellitus without complication ([place]) Yes  . Essential hypertension, benign   . Hyperlipidemia, unspecified hyperlipidemia type   . Tobacco use disorder   . Abdominal pain, unspecified abdominal location     -Will defer labs at this time due to CV19 -Pt to continue current medications.   -She is counseled to continue to monitor her blood sugars.  She should notify office if they start running high or low -Pt to continue with GI for abdomina pain -pt to follow up in office in 2 months.  She should contact office sooner prn

## 2018-09-11 ENCOUNTER — Telehealth: Payer: Self-pay | Admitting: Gastroenterology

## 2018-09-11 NOTE — Telephone Encounter (Signed)
Spoke with pt. She is having pain on the lt side of the abdomen constantly since 12/2017. Pt only has a bowel movement on the days she takes Linzess 72 mcg. Pt takes Linzess 72 mcg qod and has added Miralax qod on the days Linzess is taken. Pt feels the pain radiates to the lt side of the back as well. Pt felt comfortable speaking with a physician and has been scheduled a telephone visit on 09/12/18 with AB.

## 2018-09-11 NOTE — Telephone Encounter (Signed)
Noted  

## 2018-09-11 NOTE — Telephone Encounter (Signed)
Pt called asking for the nurse. She said that nothing has changed, she is still in pain and needed something to help her. Please advise and call [phone]

## 2018-09-12 ENCOUNTER — Other Ambulatory Visit: Payer: Self-pay

## 2018-09-12 ENCOUNTER — Ambulatory Visit (INDEPENDENT_AMBULATORY_CARE_PROVIDER_SITE_OTHER): Payer: Self-pay | Admitting: Gastroenterology

## 2018-09-12 ENCOUNTER — Encounter: Payer: Self-pay | Admitting: Gastroenterology

## 2018-09-12 DIAGNOSIS — G8929 Other chronic pain: Secondary | ICD-10-CM

## 2018-09-12 DIAGNOSIS — K5900 Constipation, unspecified: Secondary | ICD-10-CM

## 2018-09-12 DIAGNOSIS — R109 Unspecified abdominal pain: Secondary | ICD-10-CM

## 2018-09-12 MED ORDER — LIDOCAINE VISCOUS HCL 2 % MT SOLN: 15.0000 mL | Freq: Four times a day (QID) | OROMUCOSAL | 0 refills | Status: DC | PRN

## 2018-09-12 NOTE — Progress Notes (Addendum)
REVIEWED-NO ADDITIONAL RECOMMENDATIONS.  Primary Care Physician:  Casey Dryer, PA-C  Primary GI: Dr. Oneida Johnston   Virtual Visit via Telephone Note Due to COVID-19, visit is conducted virtually and was requested by patient.   I connected with Casey Johnston on 09/12/18 at  8:30 AM EDT by telephone and verified that I am speaking with the correct person using two identifiers.   I discussed the limitations, risks, security and privacy concerns of performing an evaluation and management service by telephone and the availability of in person appointments. I also discussed with the patient that there may be a patient responsible charge related to this service. The patient expressed understanding and agreed to proceed.  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 Oneida Johnston. EGD also planned due to dyspepsia. She was last seen in March 2020 and has been on Linzess 72 mcg every other day, as daily has been too strong. Even with every other day, she will have several watery BMs.   She has a telephone visit today due to persistent abdominal pain. She notes onset of abdominal pain since late last year, around the time starting Januvia. This has since been discontinued. Initially, pain was located "all over", moving from right side to left side and radiating to her back. Most recently, her pain is only left-sided and described as "upper, middle, and lower". Difficult to describe pain, stating it is a mixture of burning, sharp shooting. Initially, pain was worsened postprandially, but not any longer. She had been taking NSAIDs (ibuprofen) frequently prior to visit with [place] and has since stopped. No typical GERD symptoms. No dysphagia. Chronic nausea  but no vomiting. Eating only small amounts of food at a time. A1c has been significantly elevated in the past (15% 8 months ago and most recently 9.3%). She had been on Protonix BID without improvement with upper abdominal pain. After BM, pain is relieved for about 30 minutes, then returns. She is no longer taking PPI, as she didn't notice improvement with this.  Thus far, CT abd/pelvis with contrast in Oct 2019 without any acute findings. Abdominal xray X 2 on file. LFTs normal. Mild leukocytosis with WBC count 11.5 several months ago. Lipase 75 about 6 months ago but as this is marginally elevated, no concern for pancreatitis. H.pylori breath test negative.   Weights reviewed: she was a high of 188 in Nov 2019 and was 172 March 2020.   Past Medical History:  Diagnosis Date   Anxiety and depression    Diabetes mellitus, type II ([place])    Dr. Dorris Fetch managing   Frequent headaches chronic   ibuprofen helps some   History of UTI    Recurrent per pt (approx 3 per year)   Hyperlipidemia    Hypertension    Leukocytosis 12/2015   mild lymphocytosis.  Path smear review reassuring.  Repeat 01/2016 stable.   Morbid obesity ([place])    OSA on CPAP      Past Surgical History:  Procedure Laterality Date   ABDOMINAL HYSTERECTOMY  1998   Dysfunctional uterine bleeding.  No hx of abnormal paps.   COLONOSCOPY  2012   Dr. Earley Brooke: 1.5 cm pendunculated descending colon polyp (tubulovillous adenoma) and diminutive hyperplastic polyps  Current Meds  Medication Sig   amLODipine (NORVASC) 10 MG tablet Take 1 tablet (10 mg total) by mouth daily.   atorvastatin (LIPITOR) 20 MG tablet Take 1 tablet (20 mg total) by mouth daily.   insulin glargine (LANTUS) 100 UNIT/ML injection Inject 55 Units into the skin at bedtime.   linaclotide (LINZESS) 72 MCG capsule Take 1 capsule (72 mcg total) by mouth daily before breakfast.   lisinopril (PRINIVIL,ZESTRIL) 20 MG tablet Take 2 tablets (40 mg total)  by mouth daily.   metFORMIN (GLUCOPHAGE) 1000 MG tablet Take 1 tablet (1,000 mg total) by mouth 2 (two) times daily with a meal.   Omega-3 Fatty Acids (FISH OIL PO) Take 1 capsule by mouth daily.       Review of Systems: Gen: Denies fever, chills, anorexia. Denies fatigue, weakness, weight loss.  CV: Denies chest pain, palpitations, syncope, peripheral edema, and claudication. Resp: Denies dyspnea at rest, cough, wheezing, coughing up blood, and pleurisy. GI: see HPI Derm: Denies rash, itching, dry skin Psych: Denies depression, anxiety, memory loss, confusion. No homicidal or suicidal ideation.  Heme: Denies bruising, bleeding, and enlarged lymph nodes.  Observations/Objective: No distress. Unable to perform physical exam due to telephone encounter. No video available.   Assessment and Plan: 57 year old female with chronic abdominal pain, now mostly located entire left-side of abdomen per patient's report, with underlying constipation that has been difficult to manage. Linzess even at small doses has caused diarrhea. Only temporary relief s/p BM. Early satiety and nausea noted but without typical GERD symptoms. It is notable that she had been on consistent doses of NSAIDs previously but has now stopped. 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 Earley Brooke. Imaging reviewed without obvious abnormalities. Labs unrevealing except for uncontrolled diabetes, with last A1c in 9 range, actually improved from previous range.   Symptoms multifactorial in setting of constipation, possible gastritis/PUD due to NSAIDs, but she has had documented weight loss of over 10 lbs since onset of pain. No postprandial symptoms currently. Not typical of chronic mesenteric ischemia. Low to no concern for biliary etiology due to clinical signs/symptoms.  I have discussed with her continuing with plans for surveillance colonoscopy/ diagnosticEGD in the   future as previously planned. In the interim, I have provided samples of Amitiza 24 mcg to take each evening with food, increasing to BID if tolerated. I have also asked her to resume Protonix once daily, 30 minutes before breakfast. Viscous lidocaine also sent to pharmacy for supportive measures. She is to call next week with an update. She may ultimately need a face-to-face visit if she continues to have abdominal pain unrelieved with these supportive measures. I doubt a repeat CT would yield much at this point.   Follow Up Instructions: Please start taking Amitiza one gelcap WITH DINNER each evening (Take with food to avoid nausea). IF this does not cause diarrhea or too strong, then increase this to twice a day with food.  I would resume Protonix once each morning, 30 minutes before breakfast. I have also sent in a medication called viscous lidocaine that you can take every 6 hours by mouth for upper abdominal discomfort.   Please call us early next week with how you are doing!   I discussed the assessment and treatment plan with the patient. The patient was provided an opportunity to ask questions and all were answered. The patient agreed with the plan and demonstrated an understanding  of the instructions.   The patient was advised to call back or seek an in-person evaluation if the symptoms worsen or if the condition fails to improve as anticipated.  I provided 20 minutes of non-face-to-face time during this encounter.  Annitta Needs, PhD, ANP-BC [facility]

## 2018-09-12 NOTE — Patient Instructions (Signed)
Please start taking Amitiza one gelcap WITH DINNER each evening (Take with food to avoid nausea). IF this does not cause diarrhea or too strong, then increase this to twice a day with food.  I would resume Protonix once each morning, 30 minutes before breakfast. I have also sent in a medication called viscous lidocaine that you can take every 6 hours by mouth for upper abdominal discomfort.   Please call us early next week with how you are doing!  It was a pleasure to talk to you today. I strive to create trusting relationships with patients to provide genuine, compassionate, and quality care. I value your feedback. If you receive a survey regarding your visit,  I greatly appreciate you taking time to fill this out.   Annitta Needs, PhD, ANP-BC [facility]

## 2018-09-12 NOTE — Progress Notes (Signed)
CC'ED TO PCP 

## 2018-09-17 ENCOUNTER — Telehealth: Payer: Self-pay | Admitting: *Deleted

## 2018-09-17 NOTE — Telephone Encounter (Signed)
Pt is taking the Pantoprazole 40 mg qam, Viscous Lidocaine q6 hours for pain, Amitiza with gel capsule with dinner. Pt states that she is still having a nagging pain on the lt side abdomen with some shooting pains. Pt hasn't felt any improvement in her symptoms.

## 2018-09-18 MED ORDER — LIDOCAINE VISCOUS HCL 2 % MT SOLN: 15.0000 mL | Freq: Four times a day (QID) | OROMUCOSAL | 3 refills | Status: DC | PRN

## 2018-09-18 NOTE — Telephone Encounter (Signed)
Refilled Lidocaine. Please have patient pick up Amitiza 24 mcg samples. We need to get patient assistance going for this so that hopefully this can be covered. Agree with progress report on Monday.

## 2018-09-18 NOTE — Telephone Encounter (Signed)
I recommend increasing Amitiza to twice a day with food, especially if she is not having adequate bowel movements.   If any fever, chills, recommend CT scan. If no fever/chills, needs to increase Amitiza and call Monday. At that point, if she still has pain, would pursue CT abd/pelvis with contrast. IF she has fever/chills, needs CT with contrast this week. HOLD METFORMIN 48 hours after.

## 2018-09-18 NOTE — Telephone Encounter (Signed)
Spoke with pt.  Pt is aware to increase Amitiza to twice a day with food.  Pt is currently on her last pill and out of Lidocaine.  Pt would like to see if we would send a RX for both to [facility] in [place]. She would like generic since she doesn't have insurance right now.  Pt denies having any fever or chills.  Pt aware to call us on Monday with a follow-up.  Called pt to let her know that [name] is leaving her some Amitiza samples and patient assistance forms up front.

## 2018-09-22 NOTE — Telephone Encounter (Signed)
We do not have any amitiza 56mcg samples. I have printed patient assistance forms and will mail to the pt. Angie, please let her know.

## 2018-09-22 NOTE — Telephone Encounter (Signed)
Pt is aware.  

## 2018-09-23 NOTE — Telephone Encounter (Signed)
Pt called to give AB an update, pt is taking Amitiza daily and won't have a bowel movement unless medication is taken. Pt is also taking the Lidocaine suspension and that helps with the pain pt was having. Pt just wanted to report that no much has changed.

## 2018-09-23 NOTE — Telephone Encounter (Signed)
Please make sure she is taking this TWICE A DAY with food. It sounds like just once a day? This is something she needs to take every day. If she is having a BM when taking it, then this is a good thing! This is not an "as needed" medication.

## 2018-09-24 NOTE — Telephone Encounter (Signed)
Pt notified of AB's recommendation.

## 2018-10-06 ENCOUNTER — Emergency Department (HOSPITAL_COMMUNITY): Payer: Self-pay

## 2018-10-06 ENCOUNTER — Encounter (HOSPITAL_COMMUNITY): Payer: Self-pay | Admitting: Emergency Medicine

## 2018-10-06 ENCOUNTER — Inpatient Hospital Stay (HOSPITAL_COMMUNITY)
Admission: EM | Admit: 2018-10-06 | Discharge: 2018-10-08 | DRG: 872 | Disposition: A | Discharge status: Home / Self Care | Payer: Self-pay | Attending: Internal Medicine | Admitting: Internal Medicine

## 2018-10-06 ENCOUNTER — Other Ambulatory Visit: Payer: Self-pay

## 2018-10-06 ENCOUNTER — Encounter: Payer: Self-pay | Admitting: Physician Assistant

## 2018-10-06 ENCOUNTER — Ambulatory Visit: Payer: Self-pay | Admitting: Physician Assistant

## 2018-10-06 VITALS — BP 170/68 | HR 102 | Temp 97.7°F

## 2018-10-06 DIAGNOSIS — Z720 Tobacco use: Secondary | ICD-10-CM | POA: Diagnosis present

## 2018-10-06 DIAGNOSIS — A419 Sepsis, unspecified organism: Principal | ICD-10-CM | POA: Diagnosis present

## 2018-10-06 DIAGNOSIS — Z825 Family history of asthma and other chronic lower respiratory diseases: Secondary | ICD-10-CM

## 2018-10-06 DIAGNOSIS — F17200 Nicotine dependence, unspecified, uncomplicated: Secondary | ICD-10-CM | POA: Diagnosis present

## 2018-10-06 DIAGNOSIS — IMO0002 Reserved for concepts with insufficient information to code with codable children: Secondary | ICD-10-CM | POA: Diagnosis present

## 2018-10-06 DIAGNOSIS — Z8041 Family history of malignant neoplasm of ovary: Secondary | ICD-10-CM

## 2018-10-06 DIAGNOSIS — Z6829 Body mass index (BMI) 29.0-29.9, adult: Secondary | ICD-10-CM

## 2018-10-06 DIAGNOSIS — F17210 Nicotine dependence, cigarettes, uncomplicated: Secondary | ICD-10-CM | POA: Diagnosis present

## 2018-10-06 DIAGNOSIS — E785 Hyperlipidemia, unspecified: Secondary | ICD-10-CM | POA: Diagnosis present

## 2018-10-06 DIAGNOSIS — K219 Gastro-esophageal reflux disease without esophagitis: Secondary | ICD-10-CM | POA: Diagnosis present

## 2018-10-06 DIAGNOSIS — K610 Anal abscess: Secondary | ICD-10-CM | POA: Diagnosis present

## 2018-10-06 DIAGNOSIS — E1165 Type 2 diabetes mellitus with hyperglycemia: Secondary | ICD-10-CM | POA: Diagnosis present

## 2018-10-06 DIAGNOSIS — L02215 Cutaneous abscess of perineum: Secondary | ICD-10-CM

## 2018-10-06 DIAGNOSIS — E782 Mixed hyperlipidemia: Secondary | ICD-10-CM | POA: Diagnosis present

## 2018-10-06 DIAGNOSIS — L03317 Cellulitis of buttock: Secondary | ICD-10-CM

## 2018-10-06 DIAGNOSIS — Z888 Allergy status to other drugs, medicaments and biological substances status: Secondary | ICD-10-CM

## 2018-10-06 DIAGNOSIS — E876 Hypokalemia: Secondary | ICD-10-CM | POA: Diagnosis present

## 2018-10-06 DIAGNOSIS — Z833 Family history of diabetes mellitus: Secondary | ICD-10-CM

## 2018-10-06 DIAGNOSIS — Z20828 Contact with and (suspected) exposure to other viral communicable diseases: Secondary | ICD-10-CM | POA: Diagnosis present

## 2018-10-06 DIAGNOSIS — Z794 Long term (current) use of insulin: Secondary | ICD-10-CM

## 2018-10-06 DIAGNOSIS — G4733 Obstructive sleep apnea (adult) (pediatric): Secondary | ICD-10-CM | POA: Diagnosis present

## 2018-10-06 DIAGNOSIS — L0291 Cutaneous abscess, unspecified: Secondary | ICD-10-CM

## 2018-10-06 DIAGNOSIS — I10 Essential (primary) hypertension: Secondary | ICD-10-CM | POA: Diagnosis present

## 2018-10-06 DIAGNOSIS — K612 Anorectal abscess: Secondary | ICD-10-CM | POA: Diagnosis present

## 2018-10-06 LAB — COMPREHENSIVE METABOLIC PANEL
ALT: 11 U/L (ref 0–44)
AST: 13 U/L — ABNORMAL LOW (ref 15–41)
Albumin: 3.5 g/dL (ref 3.5–5.0)
Alkaline Phosphatase: 84 U/L (ref 38–126)
Anion gap: 11 (ref 5–15)
BUN: 9 mg/dL (ref 6–20)
CO2: 30 mmol/L (ref 22–32)
Calcium: 9.2 mg/dL (ref 8.9–10.3)
Chloride: 97 mmol/L — ABNORMAL LOW (ref 98–111)
Creatinine, Ser: 0.47 mg/dL (ref 0.44–1.00)
GFR calc Af Amer: >60 mL/min (ref >60)
GFR calc non Af Amer: >60 mL/min (ref >60)
Glucose, Bld: 139 mg/dL — ABNORMAL HIGH (ref 70–99)
Potassium: 3.1 mmol/L — ABNORMAL LOW (ref 3.5–5.1)
Sodium: 138 mmol/L (ref 135–145)
Total Bilirubin: 0.6 mg/dL (ref 0.3–1.2)
Total Protein: 7.9 g/dL (ref 6.5–8.1)

## 2018-10-06 LAB — CBC
HCT: 35.7 % — ABNORMAL LOW (ref 36.0–46.0)
Hemoglobin: 11.7 g/dL — ABNORMAL LOW (ref 12.0–15.0)
MCH: 29.8 pg (ref 26.0–34.0)
MCHC: 32.8 g/dL (ref 30.0–36.0)
MCV: 90.8 fL (ref 80.0–100.0)
Platelets: 272 10*3/uL (ref 150–400)
RBC: 3.93 MIL/uL (ref 3.87–5.11)
RDW: 13.4 % (ref 11.5–15.5)
WBC: 15.3 10*3/uL — ABNORMAL HIGH (ref 4.0–10.5)
nRBC: 0 % (ref 0.0–0.2)

## 2018-10-06 LAB — SARS CORONAVIRUS 2 BY RT PCR (HOSPITAL ORDER, PERFORMED IN ~~LOC~~ HOSPITAL LAB): SARS Coronavirus 2: NEGATIVE

## 2018-10-06 LAB — PHOSPHORUS: Phosphorus: 3.5 mg/dL (ref 2.5–4.6)

## 2018-10-06 LAB — MAGNESIUM: Magnesium: 1.6 mg/dL — ABNORMAL LOW (ref 1.7–2.4)

## 2018-10-06 MED ORDER — VANCOMYCIN HCL IN DEXTROSE 1-5 GM/200ML-% IV SOLN
1000.0000 mg | Freq: Once | INTRAVENOUS | Status: AC
  Administered 2018-10-07: 1000 mg via INTRAVENOUS

## 2018-10-06 MED ORDER — VANCOMYCIN HCL IN DEXTROSE 1-5 GM/200ML-% IV SOLN
1000.0000 mg | Freq: Once | INTRAVENOUS | Status: DC
  Filled 2018-10-06: qty 200

## 2018-10-06 MED ORDER — VANCOMYCIN HCL 500 MG IV SOLR
INTRAVENOUS | Status: AC
  Filled 2018-10-06: qty 500

## 2018-10-06 MED ORDER — INSULIN ASPART 100 UNIT/ML ~~LOC~~ SOLN
0.0000 [IU] | Freq: Three times a day (TID) | SUBCUTANEOUS | Status: DC
  Administered 2018-10-07: 2 [IU] via SUBCUTANEOUS
  Administered 2018-10-07: 3 [IU] via SUBCUTANEOUS
  Administered 2018-10-07: 2 [IU] via SUBCUTANEOUS
  Administered 2018-10-08: 3 [IU] via SUBCUTANEOUS

## 2018-10-06 MED ORDER — IOHEXOL 300 MG/ML  SOLN
100.0000 mL | Freq: Once | INTRAMUSCULAR | Status: AC | PRN
  Administered 2018-10-06: 100 mL via INTRAVENOUS

## 2018-10-06 MED ORDER — PROCHLORPERAZINE EDISYLATE 10 MG/2ML IJ SOLN
10.0000 mg | Freq: Four times a day (QID) | INTRAMUSCULAR | Status: DC | PRN
  Administered 2018-10-07: 10 mg via INTRAVENOUS
  Filled 2018-10-06: qty 2

## 2018-10-06 MED ORDER — ACETAMINOPHEN 650 MG RE SUPP: 650.0000 mg | Freq: Four times a day (QID) | RECTAL | Status: DC | PRN

## 2018-10-06 MED ORDER — ACETAMINOPHEN 325 MG PO TABS: 650.0000 mg | ORAL_TABLET | Freq: Four times a day (QID) | ORAL | Status: DC | PRN

## 2018-10-06 MED ORDER — POTASSIUM CHLORIDE IN NACL 20-0.45 MEQ/L-% IV SOLN
INTRAVENOUS | Status: AC
  Administered 2018-10-07 (×2): via INTRAVENOUS
  Filled 2018-10-06 (×2): qty 1000

## 2018-10-06 MED ORDER — VANCOMYCIN HCL 500 MG IV SOLR
500.0000 mg | Freq: Once | INTRAVENOUS | Status: AC
  Administered 2018-10-07: 02:00:00 500 mg via INTRAVENOUS
  Filled 2018-10-06: qty 500

## 2018-10-06 MED ORDER — HYDROMORPHONE HCL 1 MG/ML IJ SOLN
0.5000 mg | Freq: Once | INTRAMUSCULAR | Status: AC
  Administered 2018-10-06: 0.5 mg via INTRAVENOUS
  Filled 2018-10-06: qty 1

## 2018-10-06 MED ORDER — HYDROMORPHONE HCL 1 MG/ML IJ SOLN
INTRAMUSCULAR | Status: AC
  Filled 2018-10-06: qty 1

## 2018-10-06 MED ORDER — PIPERACILLIN-TAZOBACTAM 3.375 G IVPB 30 MIN
3.3750 g | Freq: Once | INTRAVENOUS | Status: AC
  Administered 2018-10-06: 3.375 g via INTRAVENOUS
  Filled 2018-10-06: qty 50

## 2018-10-06 MED ORDER — VANCOMYCIN HCL 1.5 G IV SOLR
1500.0000 mg | Freq: Once | INTRAVENOUS | Status: DC
  Filled 2018-10-06: qty 1500

## 2018-10-06 NOTE — ED Provider Notes (Signed)
Discussed CT results with Dr. Arnoldo Morale.  Agrees with IV antibiotics and will evaluate patient in the morning.  Asked to have hospitalist admit.  Dr. Olevia Bowens will see patient emergency room and admit.   Julianne Rice, MD 10/06/18 2240

## 2018-10-06 NOTE — H&P (Signed)
History and Physical    Casey Johnston ZOX:[id number] DOB: 08-Dec-1960 DOA: 10/06/2018  PCP: Soyla Dryer, PA-C   Patient coming from: Home.  I have personally briefly reviewed patient's old medical records in [place]  Chief Complaint: Abscess.  HPI: Casey Johnston is a 58 y.o. female with medical history significant of anxiety and depression, type 2 diabetes, frequent headaches, history of recurring UTIs, hyperlipidemia, hypertension, mild lymphocytosis, morbid obesity, sleep apnea on CPAP, history of recurring a spontaneous perirectal abscesses who was referred by her PCP to the emergency department due to having a progressively bigger and progressively more painful right sided perirectal abscess.  She denies fever, chills, but feels a little fatigued.  She denies rhinorrhea, sore throat, dyspnea, chest pain, palpitations, dizziness, diaphoresis, PND, orthopnea or recent lower extremity pitting edema.  Denies abdominal pain, nausea or emesis, diarrhea, melena or hematochezia.  No dysuria, frequency or hematuria.  No polyuria, polydipsia, polyphagia or blurred vision.  She states that her blood glucose have been stable and on the low to mid 100s.  ED Course: Initial vital signs temperature 98.6 F, pulse 98, respiration 18, blood pressure 173/76 mmHg and O2 sat 98% on room air.  Patient was given vancomycin and Zosyn in the emergency department.  White count 15.3, hemoglobin 11.7 g/dL and platelets 272.  CMP and chemistry shows a potassium of 3.1 and chloride of 97 mmol/L.  Glucose was 139 and magnesium 1.6 mg/dL.  AST was 13 units/L.  All other values are within normal limits.  Review of Systems: As per HPI otherwise 10 point review of systems negative.   Past Medical History:  Diagnosis Date   Anxiety and depression    Diabetes mellitus, type II ([place])    Dr. Dorris Fetch managing   Frequent headaches chronic   ibuprofen helps some   History of UTI    Recurrent per pt  (approx 3 per year)   Hyperlipidemia    Hypertension    Leukocytosis 12/2015   mild lymphocytosis.  Path smear review reassuring.  Repeat 01/2016 stable.   Morbid obesity ([place])    OSA on CPAP     Past Surgical History:  Procedure Laterality Date   ABDOMINAL HYSTERECTOMY  1998   Dysfunctional uterine bleeding.  No hx of abnormal paps.   COLONOSCOPY  2012   Dr. Earley Brooke: 1.5 cm pendunculated descending colon polyp (tubulovillous adenoma) and diminutive hyperplastic polyps     reports that she has been smoking cigarettes. She has a 5.00 pack-year smoking history. She has never used smokeless tobacco. She reports that she does not drink alcohol or use drugs.  Allergies  Allergen Reactions   Januvia [Sitagliptin] Other (See Comments)    Caused pancreatitis    Family History  Problem Relation Age of Onset   Cancer Mother        ovarian   COPD Father    Diabetes Father    Colon cancer Neg Hx    Prior to Admission medications   Medication Sig Start Date End Date Taking? Authorizing Provider  amLODipine (NORVASC) 10 MG tablet Take 1 tablet (10 mg total) by mouth daily. 07/02/18  Yes Soyla Dryer, PA-C  atorvastatin (LIPITOR) 20 MG tablet Take 1 tablet (20 mg total) by mouth daily. 02/05/18  Yes Soyla Dryer, PA-C  insulin glargine (LANTUS) 100 UNIT/ML injection Inject 60 Units into the skin at bedtime.    Yes [provider]  lidocaine (XYLOCAINE) 2 % solution Use as directed 15 mLs in  the mouth or throat every 6 (six) hours as needed for mouth pain. Patient taking differently: Use as directed 15 mLs in the mouth or throat every 6 (six) hours as needed. For stomach pain 09/18/18  Yes Annitta Needs, NP  linaclotide [facility]) 72 MCG capsule Take 1 capsule (72 mcg total) by mouth daily before breakfast. Patient taking differently: Take 72 mcg by mouth every other day.  07/30/18  Yes Carlis Stable, NP  lisinopril (PRINIVIL,ZESTRIL) 20 MG tablet Take 2 tablets (40 mg  total) by mouth daily. 07/31/18  Yes Soyla Dryer, PA-C  metFORMIN (GLUCOPHAGE) 1000 MG tablet Take 1 tablet (1,000 mg total) by mouth 2 (two) times daily with a meal. 07/09/18  Yes Soyla Dryer, PA-C  Omega-3 Fatty Acids (FISH OIL PO) Take 1 capsule by mouth daily.    Yes [provider]  pantoprazole (PROTONIX) 40 MG tablet Take 40 mg by mouth 2 (two) times a day. 09/27/18  Yes [provider]  lidocaine (XYLOCAINE) 2 % solution Use as directed 15 mLs in the mouth or throat every 6 (six) hours as needed for mouth pain. Patient not taking: Reported on 2018-10-31 09/12/18   Annitta Needs, NP    Physical Exam: Vitals:   31-Oct-2018 2200 10/31/2018 2215 Oct 31, 2018 2230 10-31-18 2245  BP: (!) 142/65  (!) 141/65   Pulse: 81 83 82 82  Resp:      Temp:      TempSrc:      SpO2: 95% 94% 94% 94%  Weight:      Height:        Constitutional: NAD, calm, comfortable Eyes: PERRL, lids and conjunctivae normal ENMT: Mucous membranes are moist. Posterior pharynx clear of any exudate or lesions. Neck: normal, supple, no masses, no thyromegaly Respiratory: clear to auscultation bilaterally, no wheezing, no crackles. Normal respiratory effort. No accessory muscle use.  Cardiovascular: Regular rate and rhythm, no murmurs / rubs / gallops. No extremity edema. 2+ pedal pulses. No carotid bruits.  Abdomen: Obese, soft, no tenderness, no masses palpated. No hepatosplenomegaly. Bowel sounds positive.  Musculoskeletal: no clubbing / cyanosis. Good ROM, no contractures. Normal muscle tone.  Skin: Large perirectal abscess, which started draining purulent fluid shortly before my examination.  Please see pictures below. Neurologic: CN 2-12 grossly intact. Sensation intact, DTR normal. Strength 5/5 in all 4.  Psychiatric: Normal judgment and insight. Alert and oriented x 3. Normal mood.         Labs on Admission: I have personally reviewed following labs and imaging studies  CBC: Recent Labs    Lab 31-Oct-2018 1923  WBC 15.3*  HGB 11.7*  HCT 35.7*  MCV 90.8  PLT 854   Basic Metabolic Panel: Recent Labs  Lab 10-31-18 1923  NA 138  K 3.1*  CL 97*  CO2 30  GLUCOSE 139*  BUN 9  CREATININE 0.47  CALCIUM 9.2   GFR: Estimated Creatinine Clearance: 77.7 mL/min (by C-G formula based on SCr of 0.47 mg/dL). Liver Function Tests: Recent Labs  Lab Oct 31, 2018 1923  AST 13*  ALT 11  ALKPHOS 84  BILITOT 0.6  PROT 7.9  ALBUMIN 3.5   No results for input(s): LIPASE, AMYLASE in the last 168 hours. No results for input(s): AMMONIA in the last 168 hours. Coagulation Profile: No results for input(s): INR, PROTIME in the last 168 hours. Cardiac Enzymes: No results for input(s): CKTOTAL, CKMB, CKMBINDEX, TROPONINI in the last 168 hours. BNP (last 3 results) No results for input(s): PROBNP  in the last 8760 hours. HbA1C: No results for input(s): HGBA1C in the last 72 hours. CBG: No results for input(s): GLUCAP in the last 168 hours. Lipid Profile: No results for input(s): CHOL, HDL, LDLCALC, TRIG, CHOLHDL, LDLDIRECT in the last 72 hours. Thyroid Function Tests: No results for input(s): TSH, T4TOTAL, FREET4, T3FREE, THYROIDAB in the last 72 hours. Anemia Panel: No results for input(s): VITAMINB12, FOLATE, FERRITIN, TIBC, IRON, RETICCTPCT in the last 72 hours. Urine analysis:    Component Value Date/Time   COLORURINE YELLOW 05/21/2018 2052   APPEARANCEUR CLEAR 05/21/2018 2052   LABSPEC 1.028 05/21/2018 2052   PHURINE 5.0 05/21/2018 2052   GLUCOSEU >=500 (A) 05/21/2018 2052   HGBUR NEGATIVE 05/21/2018 2052   BILIRUBINUR NEGATIVE 05/21/2018 2052   [place] NEGATIVE 05/21/2018 2052   PROTEINUR NEGATIVE 05/21/2018 2052   NITRITE NEGATIVE 05/21/2018 2052   LEUKOCYTESUR NEGATIVE 05/21/2018 2052    Radiological Exams on Admission: Ct Pelvis W Contrast  Result Date: 10/06/2018 CLINICAL DATA:  Perirectal abscess. EXAM: CT PELVIS WITH CONTRAST TECHNIQUE: Multidetector CT  imaging of the pelvis was performed using the standard protocol following the bolus administration of intravenous contrast. CONTRAST:  173mL OMNIPAQUE IOHEXOL 300 MG/ML  SOLN COMPARISON:  CT dated 03/04/2018. FINDINGS: Urinary Tract:  No abnormality visualized. Bowel: There is scattered colonic diverticula without CT evidence of diverticulitis. The appendix is unremarkable. There is no evidence of a small-bowel obstruction. There is a perianal fluid collection measuring approximately 3.2 by 2.2 cm. There is fat stranding involving the adjacent gluteal region. Vascular/Lymphatic: Atherosclerotic changes are noted of the bilateral common femoral arteries, right worse than left. There is an enlarged right pelvic lymph node measuring 1.6 cm (axial series 2, image 29). No significantly enlarged left pelvic sidewall lymph nodes. There are enlarged right inguinal lymph nodes. Reproductive: The patient is status post hysterectomy. There is no evidence of an ovarian mass. Other: There is a 1.2 cm hyperdense nodule involving the patient's low anterior right abdominal wall. This nodule has slightly increased from prior study and appears to contain a small cavitation. Musculoskeletal: There are advanced degenerative changes at the L5-S1 level. IMPRESSION: 1. There is a 3.2 x 2.2 cm right perianal abscess. 2. Enlarging right inguinal and right pelvic sidewall lymph nodes of unknown clinical significance. Additionally, there is enlarging 1.2 cm subcutaneous nodule involving the low anterior abdominal wall (previously measuring approximately 0.9 cm). Clinical correlation with direct visualization is recommended. This subcutaneous nodule appears to have a cavitary component and could represent an infectious or malignant etiology. Consider tissue sampling as clinically indicated given its persistence across prior CTs dating back to 03/04/2018. Electronically Signed   By: Constance Holster M.D.   On: 10/06/2018 21:50    EKG:  Independently reviewed.   Assessment/Plan Principal Problem:   Perianal abscess Admit to MedSurg/inpatient. Keep n.p.o. Analgesics as needed. Continue broad-spectrum IV antibiotics. Follow-up wound culture and sensitivity. Consult general surgery in a.m.  Active Problems:   Uncontrolled type 2 diabetes mellitus with complication, without long-term current use of insulin (HCC) Last hemoglobin A1c was 9.3% on 06/24/2018. Currently n.p.o. May resume carb modified diet if cleared by surgery tomorrow. Continue Lantus 60 units SQ at bedtime unless n.p.o. beyond 05/19 evening. Continue metformin 1000 mg p.o.    Hyperlipidemia Continue atorvastatin 20 mg p.o. daily.    Essential hypertension, benign Continue lisinopril 40 mg p.o. daily. Continue amlodipine 10 mg p.o. daily. Monitor blood pressure, renal function and electrolytes.    GERD (gastroesophageal reflux disease)  Continue Protonix 40 mg p.o. daily.    Hypokalemia Replacing. Check magnesium level. Check phosphorus level.   DVT prophylaxis: SCDs. Code Status: Full code. Family Communication: Disposition Plan: Admit for IV antibiotics for 2 to 3 days. Consults called: Routine general surgery consult. Admission status: Inpatient/MedSurg.   Reubin Milan MD [facility]  10/06/2018, 11:24 PM   This document was prepared using Dragon voice recognition software and may contain some unintended transcription errors.

## 2018-10-06 NOTE — ED Provider Notes (Signed)
[facility] Emergency Department Provider Note MRN:  [id number]  Arrival date & time: 10/06/18     Chief Complaint   Abscess   History of Present Illness   Casey Johnston is a 58 y.o. year-old female with a history of diabetes, hypertension, obesity presenting to the ED with chief complaint of abscess.  Perirectal pain for the past 4 days, gradual onset, has been worsening.  Currently 8 out of 10 pain.  Sharp, worse with sitting.  Denies fever.  Evaluated by PCP, who advised ED evaluation for perirectal abscess.  No other exacerbating relieving factors.  Denies chest pain or shortness of breath, no abdominal pain, no dysuria.  Review of Systems  A complete 10 system review of systems was obtained and all systems are negative except as noted in the HPI and PMH.   Patient's Health History    Past Medical History:  Diagnosis Date  . Anxiety and depression   . Diabetes mellitus, type II ([place])    Dr. Dorris Fetch managing  . Frequent headaches chronic   ibuprofen helps some  . History of UTI    Recurrent per pt (approx 3 per year)  . Hyperlipidemia   . Hypertension   . Leukocytosis 12/2015   mild lymphocytosis.  Path smear review reassuring.  Repeat 01/2016 stable.  . Morbid obesity ([place])   . OSA on CPAP     Past Surgical History:  Procedure Laterality Date  . ABDOMINAL HYSTERECTOMY  1998   Dysfunctional uterine bleeding.  No hx of abnormal paps.  . COLONOSCOPY  2012   Dr. Earley Brooke: 1.5 cm pendunculated descending colon polyp (tubulovillous adenoma) and diminutive hyperplastic polyps    Family History  Problem Relation Age of Onset  . Cancer Mother        ovarian  . COPD Father   . Diabetes Father   . Colon cancer Neg Hx     Social History   Socioeconomic History  . Marital status: Widowed    Spouse name: Not on file  . Number of children: Not on file  . Years of education: Not on file  . Highest education level: Not on file  Occupational History  .  Not on file  Social Needs  . Financial resource strain: Not on file  . Food insecurity:    Worry: Not on file    Inability: Not on file  . Transportation needs:    Medical: Not on file    Non-medical: Not on file  Tobacco Use  . Smoking status: Current Every Day Smoker    Packs/day: 0.25    Years: 20.00    Pack years: 5.00    Types: Cigarettes  . Smokeless tobacco: Never Used  Substance and Sexual Activity  . Alcohol use: No  . Drug use: No  . Sexual activity: Not on file  Lifestyle  . Physical activity:    Days per week: Not on file    Minutes per session: Not on file  . Stress: Not on file  Relationships  . Social connections:    Talks on phone: Not on file    Gets together: Not on file    Attends religious service: Not on file    Active member of club or organization: Not on file    Attends meetings of clubs or organizations: Not on file    Relationship status: Not on file  . Intimate partner violence:    Fear of current or ex partner: Not on file  Emotionally abused: Not on file    Physically abused: Not on file    Forced sexual activity: Not on file  Other Topics Concern  . Not on file  Social History Narrative   Separated x 20 yrs, one daughter.  She lives with her.  Great niece and nephew live with her as well.   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   Tob: 7 pack year hx, quit 2010.   Alc: none   Exercise: none     Physical Exam  Vital Signs and Nursing Notes reviewed Vitals:   10/06/18 1431 10/06/18 1901  BP: (!) 173/76 (!) 159/76  Pulse: 98 91  Resp: 18 20  Temp: 98.6 F (37 C) 98.5 F (36.9 C)  SpO2: 98% 98%    CONSTITUTIONAL: Well-appearing, NAD NEURO:  Alert and oriented x 3, no focal deficits EYES:  eyes equal and reactive ENT/NECK:  no LAD, no JVD CARDIO: Regular rate, well-perfused, normal S1 and S2 PULM:  CTAB no wheezing or rhonchi GI/GU:  normal bowel sounds, non-distended, non-tender MSK/SPINE:  No gross deformities, no edema  SKIN: Estimated 8 cm area of tenderness and fluctuance within the left perineum directly abutting the anus PSYCH:  Appropriate speech and behavior  Diagnostic and Interventional Summary    Labs Reviewed  SARS CORONAVIRUS 2 (HOSPITAL ORDER, [employer] [facility])  CBC  COMPREHENSIVE METABOLIC PANEL    CT PELVIS W CONTRAST    (Results Pending)    Medications  HYDROmorphone (DILAUDID) injection 0.5 mg (has no administration in time range)     Procedures Critical Care  ED Course and Medical Decision Making  I have reviewed the triage vital signs and the nursing notes.  Pertinent labs & imaging results that were available during my care of the patient were reviewed by me and considered in my medical decision making (see below for details).  Large perirectal abscess, will obtain CT to determine appropriateness of ED physician versus general surgeon drainage.  No fever, normal vital signs, not septic, benign abdomen.  Given patient's comorbidities, a more thorough drainage by general surgeon may be best.  Signed out to oncoming provider at shift change.  Barth Kirks. [place] Small, [place] [email or web address]  Final Clinical Impressions(s) / ED Diagnoses     ICD-10-CM   1. Perineal abscess L02.215     ED Discharge Orders    None         Maudie Flakes, MD 10/06/18 Pauline Aus

## 2018-10-06 NOTE — Progress Notes (Signed)
BP (!) 170/68   Pulse (!) 102   Temp 97.7 F (36.5 C)   SpO2 96%    Subjective:    Patient ID: Casey Johnston, female    DOB: 10/22/1960, 58 y.o.   MRN: [id number]  HPI: Casey Johnston is a 58 y.o. female presenting on 10/06/2018 for Mass (noticed on Thursday 10-02-18 in area between her vagina and anus which was oblong shaped about greater than an inch both lenght and width. pt states has gotten bigger. pt states no draining. pt states she has been using boil ease which has not helped.)   HPI   Chief Complaint  Patient presents with  . Mass    noticed on Thursday 10-02-18 in area between her vagina and anus which was oblong shaped about greater than an inch both lenght and width. pt states has gotten bigger. pt states no draining. pt states she has been using boil ease which has not helped.     Pt started with boil on Thursday last week and it has increased in size.  It is between vagina and rectum and hurts a lot.  Pt says she had this once in the past and it was very painful.    She says her blood sugars have been good lately- around 120.   Her last A1C was 9.3.    Relevant past medical, surgical, family and social history reviewed and updated as indicated. Interim medical history since our last visit reviewed. Allergies and medications reviewed and updated.    Current Outpatient Medications:  .  amLODipine (NORVASC) 10 MG tablet, Take 1 tablet (10 mg total) by mouth daily., Disp: 90 tablet, Rfl: 0 .  atorvastatin (LIPITOR) 20 MG tablet, Take 1 tablet (20 mg total) by mouth daily., Disp: 90 tablet, Rfl: 2 .  insulin glargine (LANTUS) 100 UNIT/ML injection, Inject 60 Units into the skin at bedtime. , Disp: , Rfl:  .  lidocaine (XYLOCAINE) 2 % solution, Use as directed 15 mLs in the mouth or throat every 6 (six) hours as needed for mouth pain., Disp: 100 mL, Rfl: 0 .  lidocaine (XYLOCAINE) 2 % solution, Use as directed 15 mLs in the mouth or throat every 6 (six) hours as  needed for mouth pain., Disp: 100 mL, Rfl: 3 .  linaclotide (LINZESS) 72 MCG capsule, Take 1 capsule (72 mcg total) by mouth daily before breakfast., Disp: 30 capsule, Rfl: 5 .  lisinopril (PRINIVIL,ZESTRIL) 20 MG tablet, Take 2 tablets (40 mg total) by mouth daily., Disp: 180 tablet, Rfl: 1 .  metFORMIN (GLUCOPHAGE) 1000 MG tablet, Take 1 tablet (1,000 mg total) by mouth 2 (two) times daily with a meal., Disp: 90 tablet, Rfl: 1 .  Omega-3 Fatty Acids (FISH OIL PO), Take 1 capsule by mouth daily. , Disp: , Rfl:    Review of Systems  Per HPI unless specifically indicated above     Objective:    BP (!) 170/68   Pulse (!) 102   Temp 97.7 F (36.5 C)   SpO2 96%   Wt Readings from Last 3 Encounters:  07/31/18 172 lb 8 oz (78.2 kg)  07/30/18 172 lb 9.6 oz (78.3 kg)  07/02/18 175 lb (79.4 kg)    Physical Exam Exam conducted with a chaperone present.  Genitourinary:    Labia:        Right: Tenderness present.        Comments: Red swelled tender firm.  No drainage.  (nurse Berenice assisted)  Assessment & Plan:    Encounter Diagnosis  Name Primary?  Marland Kitchen Abscess Yes    -pt is sent to ER for further evaluation.  Called and notified triage nurse at [facility] ER -pt to follow up here for diabetes next month as scheduled

## 2018-10-06 NOTE — ED Triage Notes (Signed)
Pt sent over by PCP for perirectal abscess. Pt has had it since Thursday. Denies fever or drainage. States she may need to have it drained.

## 2018-10-07 DIAGNOSIS — F17200 Nicotine dependence, unspecified, uncomplicated: Secondary | ICD-10-CM | POA: Diagnosis present

## 2018-10-07 DIAGNOSIS — Z720 Tobacco use: Secondary | ICD-10-CM | POA: Diagnosis present

## 2018-10-07 LAB — CBC WITH DIFFERENTIAL/PLATELET
Abs Immature Granulocytes: 0.04 10*3/uL (ref 0.00–0.07)
Basophils Absolute: 0.1 10*3/uL (ref 0.0–0.1)
Basophils Relative: 1 %
Eosinophils Absolute: 0.2 10*3/uL (ref 0.0–0.5)
Eosinophils Relative: 1 %
HCT: 33.2 % — ABNORMAL LOW (ref 36.0–46.0)
Hemoglobin: 11 g/dL — ABNORMAL LOW (ref 12.0–15.0)
Immature Granulocytes: 0 %
Lymphocytes Relative: 40 %
Lymphs Abs: 5.7 10*3/uL — ABNORMAL HIGH (ref 0.7–4.0)
MCH: 30.3 pg (ref 26.0–34.0)
MCHC: 33.1 g/dL (ref 30.0–36.0)
MCV: 91.5 fL (ref 80.0–100.0)
Monocytes Absolute: 0.6 10*3/uL (ref 0.1–1.0)
Monocytes Relative: 4 %
Neutro Abs: 7.8 10*3/uL — ABNORMAL HIGH (ref 1.7–7.7)
Neutrophils Relative %: 54 %
Platelets: 247 10*3/uL (ref 150–400)
RBC: 3.63 MIL/uL — ABNORMAL LOW (ref 3.87–5.11)
RDW: 13.2 % (ref 11.5–15.5)
WBC: 14.4 10*3/uL — ABNORMAL HIGH (ref 4.0–10.5)
nRBC: 0 % (ref 0.0–0.2)

## 2018-10-07 LAB — BASIC METABOLIC PANEL WITH GFR
Anion gap: 9 (ref 5–15)
BUN: 13 mg/dL (ref 6–20)
CO2: 28 mmol/L (ref 22–32)
Calcium: 9 mg/dL (ref 8.9–10.3)
Chloride: 102 mmol/L (ref 98–111)
Creatinine, Ser: 0.55 mg/dL (ref 0.44–1.00)
GFR calc Af Amer: >60 mL/min
GFR calc non Af Amer: >60 mL/min
Glucose, Bld: 135 mg/dL — ABNORMAL HIGH (ref 70–99)
Potassium: 3.6 mmol/L (ref 3.5–5.1)
Sodium: 139 mmol/L (ref 135–145)

## 2018-10-07 LAB — GLUCOSE, CAPILLARY
Glucose-Capillary: 111 mg/dL — ABNORMAL HIGH (ref 70–99)
Glucose-Capillary: 119 mg/dL — ABNORMAL HIGH (ref 70–99)
Glucose-Capillary: 133 mg/dL — ABNORMAL HIGH (ref 70–99)
Glucose-Capillary: 135 mg/dL — ABNORMAL HIGH (ref 70–99)
Glucose-Capillary: 166 mg/dL — ABNORMAL HIGH (ref 70–99)

## 2018-10-07 LAB — MRSA PCR SCREENING: MRSA by PCR: NEGATIVE

## 2018-10-07 MED ORDER — METFORMIN HCL 500 MG PO TABS
1000.0000 mg | ORAL_TABLET | Freq: Two times a day (BID) | ORAL | Status: DC
  Administered 2018-10-07 – 2018-10-08 (×3): 1000 mg via ORAL
  Filled 2018-10-07 (×3): qty 2

## 2018-10-07 MED ORDER — HEPARIN SODIUM (PORCINE) 5000 UNIT/ML IJ SOLN
5000.0000 [IU] | Freq: Three times a day (TID) | INTRAMUSCULAR | Status: DC
  Administered 2018-10-07 – 2018-10-08 (×3): 5000 [IU] via SUBCUTANEOUS
  Filled 2018-10-07 (×3): qty 1

## 2018-10-07 MED ORDER — HYDROMORPHONE HCL 1 MG/ML IJ SOLN
1.0000 mg | INTRAMUSCULAR | Status: DC | PRN
  Administered 2018-10-07 (×2): 1 mg via INTRAVENOUS
  Filled 2018-10-07 (×2): qty 1

## 2018-10-07 MED ORDER — INSULIN GLARGINE 100 UNIT/ML ~~LOC~~ SOLN
60.0000 [IU] | Freq: Every day | SUBCUTANEOUS | Status: DC
  Administered 2018-10-07 – 2018-10-08 (×2): 60 [IU] via SUBCUTANEOUS
  Filled 2018-10-07 (×3): qty 0.6

## 2018-10-07 MED ORDER — ATORVASTATIN CALCIUM 20 MG PO TABS
20.0000 mg | ORAL_TABLET | Freq: Every day | ORAL | Status: DC
  Administered 2018-10-07 – 2018-10-08 (×2): 20 mg via ORAL
  Filled 2018-10-07 (×2): qty 1

## 2018-10-07 MED ORDER — AMLODIPINE BESYLATE 5 MG PO TABS
10.0000 mg | ORAL_TABLET | Freq: Every day | ORAL | Status: DC
  Administered 2018-10-07 – 2018-10-08 (×2): 10 mg via ORAL
  Filled 2018-10-07 (×2): qty 2

## 2018-10-07 MED ORDER — VANCOMYCIN HCL 1.5 G IV SOLR
1500.0000 mg | INTRAVENOUS | Status: DC
  Administered 2018-10-08: 1500 mg via INTRAVENOUS
  Filled 2018-10-07 (×2): qty 1500

## 2018-10-07 MED ORDER — POTASSIUM CHLORIDE CRYS ER 20 MEQ PO TBCR
40.0000 meq | EXTENDED_RELEASE_TABLET | Freq: Once | ORAL | Status: AC
  Administered 2018-10-07: 40 meq via ORAL
  Filled 2018-10-07: qty 2

## 2018-10-07 MED ORDER — HYDROMORPHONE HCL 1 MG/ML IJ SOLN
1.0000 mg | Freq: Once | INTRAMUSCULAR | Status: AC
  Administered 2018-10-07: 1 mg via INTRAVENOUS
  Filled 2018-10-07: qty 1

## 2018-10-07 MED ORDER — PIPERACILLIN-TAZOBACTAM 3.375 G IVPB
3.3750 g | Freq: Three times a day (TID) | INTRAVENOUS | Status: DC
  Administered 2018-10-07 – 2018-10-08 (×4): 3.375 g via INTRAVENOUS
  Filled 2018-10-07 (×4): qty 50

## 2018-10-07 MED ORDER — HYDRALAZINE HCL 20 MG/ML IJ SOLN: 10.0000 mg | Freq: Four times a day (QID) | INTRAMUSCULAR | Status: DC | PRN

## 2018-10-07 MED ORDER — NICOTINE 14 MG/24HR TD PT24
14.0000 mg | MEDICATED_PATCH | Freq: Every day | TRANSDERMAL | Status: DC
  Administered 2018-10-07: 12:00:00 14 mg via TRANSDERMAL
  Filled 2018-10-07 (×3): qty 1

## 2018-10-07 MED ORDER — PANTOPRAZOLE SODIUM 40 MG PO TBEC
40.0000 mg | DELAYED_RELEASE_TABLET | Freq: Every day | ORAL | Status: DC
  Administered 2018-10-08: 40 mg via ORAL
  Filled 2018-10-07 (×2): qty 1

## 2018-10-07 MED ORDER — LINACLOTIDE 72 MCG PO CAPS
72.0000 ug | ORAL_CAPSULE | ORAL | Status: DC
  Filled 2018-10-07 (×2): qty 1

## 2018-10-07 MED ORDER — KETOROLAC TROMETHAMINE 30 MG/ML IJ SOLN
30.0000 mg | Freq: Once | INTRAMUSCULAR | Status: AC
  Administered 2018-10-07: 01:00:00 30 mg via INTRAVENOUS
  Filled 2018-10-07: qty 1

## 2018-10-07 MED ORDER — LIDOCAINE VISCOUS HCL 2 % MT SOLN: 15.0000 mL | Freq: Four times a day (QID) | OROMUCOSAL | Status: DC | PRN

## 2018-10-07 MED ORDER — LISINOPRIL 10 MG PO TABS
40.0000 mg | ORAL_TABLET | Freq: Every day | ORAL | Status: DC
  Administered 2018-10-07 – 2018-10-08 (×2): 40 mg via ORAL
  Filled 2018-10-07 (×2): qty 4

## 2018-10-07 MED ORDER — PANTOPRAZOLE SODIUM 40 MG PO TBEC
40.0000 mg | DELAYED_RELEASE_TABLET | Freq: Two times a day (BID) | ORAL | Status: DC
  Administered 2018-10-07 (×2): 40 mg via ORAL
  Filled 2018-10-07 (×2): qty 1

## 2018-10-07 NOTE — Consult Note (Signed)
Reason for Consult: Perirectal abscess Referring Physician: Dr. Lowella Fairy is an 58 y.o. female.  HPI: Patient is a 58 year old black female with a history of diabetes mellitus, hypertension, and morbid obesity who presented to [facility] yesterday evening with the perirectal abscess.  She states she has had these episodes in the past and has never required operative intervention.  A CT scan of the abdomen and pelvis was performed which revealed a 2 to 3 cm abscess in the right perirectal region.  Soon after admission, the abscess spontaneously drained.  She was started on IV antibiotics.  This morning, she states she has no pain in the perirectal region.  Past Medical History:  Diagnosis Date  . Anxiety and depression   . Diabetes mellitus, type II ([place])    Dr. Dorris Fetch managing  . Frequent headaches chronic   ibuprofen helps some  . History of UTI    Recurrent per pt (approx 3 per year)  . Hyperlipidemia   . Hypertension   . Leukocytosis 12/2015   mild lymphocytosis.  Path smear review reassuring.  Repeat 01/2016 stable.  . Morbid obesity ([place])   . OSA on CPAP     Past Surgical History:  Procedure Laterality Date  . ABDOMINAL HYSTERECTOMY  1998   Dysfunctional uterine bleeding.  No hx of abnormal paps.  . COLONOSCOPY  2012   Dr. Earley Brooke: 1.5 cm pendunculated descending colon polyp (tubulovillous adenoma) and diminutive hyperplastic polyps    Family History  Problem Relation Age of Onset  . Cancer Mother        ovarian  . COPD Father   . Diabetes Father   . Colon cancer Neg Hx     Social History:  reports that she has been smoking cigarettes. She has a 5.00 pack-year smoking history. She has never used smokeless tobacco. She reports that she does not drink alcohol or use drugs.  Allergies:  Allergies  Allergen Reactions  . Januvia [Sitagliptin] Other (See Comments)    Caused pancreatitis    Medications: I have reviewed the patient's current  medications.  Results for orders placed or performed during the hospital encounter of 10/06/18 (from the past 48 hour(s))  CBC     Status: Abnormal   Collection Time: 10/06/18  7:23 PM  Result Value Ref Range   WBC 15.3 (H) 4.0 - 10.5 K/uL   RBC 3.93 3.87 - 5.11 MIL/uL   Hemoglobin 11.7 (L) 12.0 - 15.0 g/dL   HCT 35.7 (L) 36.0 - 46.0 %   MCV 90.8 80.0 - 100.0 fL   MCH 29.8 26.0 - 34.0 pg   MCHC 32.8 30.0 - 36.0 g/dL   RDW 13.4 11.5 - 15.5 %   Platelets 272 150 - 400 K/uL   nRBC 0.0 0.0 - 0.2 %    Comment: Performed at [facility], [address], [place], [place] [address]  Comprehensive metabolic panel     Status: Abnormal   Collection Time: 10/06/18  7:23 PM  Result Value Ref Range   Sodium 138 135 - 145 mmol/L   Potassium 3.1 (L) 3.5 - 5.1 mmol/L   Chloride 97 (L) 98 - 111 mmol/L   CO2 30 22 - 32 mmol/L   Glucose, Bld 139 (H) 70 - 99 mg/dL   BUN 9 6 - 20 mg/dL   Creatinine, Ser 0.47 0.44 - 1.00 mg/dL   Calcium 9.2 8.9 - 10.3 mg/dL   Total Protein 7.9 6.5 - 8.1 g/dL  Albumin 3.5 3.5 - 5.0 g/dL   AST 13 (L) 15 - 41 U/L   ALT 11 0 - 44 U/L   Alkaline Phosphatase 84 38 - 126 U/L   Total Bilirubin 0.6 0.3 - 1.2 mg/dL   GFR calc non Af Amer >60 >60 mL/min   GFR calc Af Amer >60 >60 mL/min   Anion gap 11 5 - 15    Comment: Performed at [facility], [address], [place], [place] [address]  Magnesium     Status: Abnormal   Collection Time: 10/06/18  7:23 PM  Result Value Ref Range   Magnesium 1.6 (L) 1.7 - 2.4 mg/dL    Comment: Performed at [facility], [address] [place], [place] [address]  Phosphorus     Status: None   Collection Time: 10/06/18  7:23 PM  Result Value Ref Range   Phosphorus 3.5 2.5 - 4.6 mg/dL    Comment: Performed at [facility], [address], [place], [place] [address]  SARS Coronavirus 2 ([place] - Performed in [place] hospital lab), Hosp Order     Status: None   Collection Time: 10/06/18  7:51 PM  Result Value Ref Range   SARS  Coronavirus 2 NEGATIVE NEGATIVE    Comment: (NOTE) If result is NEGATIVE SARS-CoV-2 target nucleic acids are NOT DETECTED. The SARS-CoV-2 RNA is generally detectable in upper and lower  respiratory specimens during the acute phase of infection. The lowest  concentration of SARS-CoV-2 viral copies this assay can detect is 250  copies / mL. A negative result does not preclude SARS-CoV-2 infection  and should not be used as the sole basis for treatment or other  patient management decisions.  A negative result may occur with  improper specimen collection / handling, submission of specimen other  than nasopharyngeal swab, presence of viral mutation(s) within the  areas targeted by this assay, and inadequate number of viral copies  (<250 copies / mL). A negative result must be combined with clinical  observations, patient history, and epidemiological information. If result is POSITIVE SARS-CoV-2 target nucleic acids are DETECTED. The SARS-CoV-2 RNA is generally detectable in upper and lower  respiratory specimens dur ing the acute phase of infection.  Positive  results are indicative of active infection with SARS-CoV-2.  Clinical  correlation with patient history and other diagnostic information is  necessary to determine patient infection status.  Positive results do  not rule out bacterial infection or co-infection with other viruses. If result is PRESUMPTIVE POSTIVE SARS-CoV-2 nucleic acids MAY BE PRESENT.   A presumptive positive result was obtained on the submitted specimen  and confirmed on repeat testing.  While 2019 novel coronavirus  (SARS-CoV-2) nucleic acids may be present in the submitted sample  additional confirmatory testing may be necessary for epidemiological  and / or clinical management purposes  to differentiate between  SARS-CoV-2 and other Sarbecovirus currently known to infect humans.  If clinically indicated additional testing with an alternate test  methodology  [phone]) is advised. The SARS-CoV-2 RNA is generally  detectable in upper and lower respiratory sp ecimens during the acute  phase of infection. The expected result is Negative. Fact Sheet for Patients:  StrictlyIdeas.no Fact Sheet for Healthcare Providers: BankingDealers.co.za This test is not yet approved or cleared by the [place] FDA and has been authorized for detection and/or diagnosis of SARS-CoV-2 by FDA under an Emergency Use Authorization (EUA).  This EUA will remain in effect (meaning this test can be used) for  the duration of the COVID-19 declaration under Section 564(b)(1) of the Act, 21 U.S.C. section 360bbb-3(b)(1), unless the authorization is terminated or revoked sooner. Performed at [facility], [address], [place], [place] [address]   Glucose, capillary     Status: Abnormal   Collection Time: 10/07/18 12:14 AM  Result Value Ref Range   Glucose-Capillary 119 (H) 70 - 99 mg/dL  Glucose, capillary     Status: Abnormal   Collection Time: 10/07/18  7:20 AM  Result Value Ref Range   Glucose-Capillary 135 (H) 70 - 99 mg/dL  CBC WITH DIFFERENTIAL     Status: Abnormal   Collection Time: 10/07/18  8:20 AM  Result Value Ref Range   WBC 14.4 (H) 4.0 - 10.5 K/uL   RBC 3.63 (L) 3.87 - 5.11 MIL/uL   Hemoglobin 11.0 (L) 12.0 - 15.0 g/dL   HCT 33.2 (L) 36.0 - 46.0 %   MCV 91.5 80.0 - 100.0 fL   MCH 30.3 26.0 - 34.0 pg   MCHC 33.1 30.0 - 36.0 g/dL   RDW 13.2 11.5 - 15.5 %   Platelets 247 150 - 400 K/uL   nRBC 0.0 0.0 - 0.2 %   Neutrophils Relative % 54 %   Neutro Abs 7.8 (H) 1.7 - 7.7 K/uL   Lymphocytes Relative 40 %   Lymphs Abs 5.7 (H) 0.7 - 4.0 K/uL   Monocytes Relative 4 %   Monocytes Absolute 0.6 0.1 - 1.0 K/uL   Eosinophils Relative 1 %   Eosinophils Absolute 0.2 0.0 - 0.5 K/uL   Basophils Relative 1 %   Basophils Absolute 0.1 0.0 - 0.1 K/uL   Immature Granulocytes 0 %   Abs Immature Granulocytes 0.04 0.00  - 0.07 K/uL    Comment: Performed at [facility], [address] [place], [place] [address]  Basic metabolic panel     Status: Abnormal   Collection Time: 10/07/18  8:20 AM  Result Value Ref Range   Sodium 139 135 - 145 mmol/L   Potassium 3.6 3.5 - 5.1 mmol/L   Chloride 102 98 - 111 mmol/L   CO2 28 22 - 32 mmol/L   Glucose, Bld 135 (H) 70 - 99 mg/dL   BUN 13 6 - 20 mg/dL   Creatinine, Ser 0.55 0.44 - 1.00 mg/dL   Calcium 9.0 8.9 - 10.3 mg/dL   GFR calc non Af Amer >60 >60 mL/min   GFR calc Af Amer >60 >60 mL/min   Anion gap 9 5 - 15    Comment: Performed at [facility], [address], [place], [place] [address]    Ct Pelvis W Contrast  Result Date: 10/06/2018 CLINICAL DATA:  Perirectal abscess. EXAM: CT PELVIS WITH CONTRAST TECHNIQUE: Multidetector CT imaging of the pelvis was performed using the standard protocol following the bolus administration of intravenous contrast. CONTRAST:  171mL OMNIPAQUE IOHEXOL 300 MG/ML  SOLN COMPARISON:  CT dated 03/04/2018. FINDINGS: Urinary Tract:  No abnormality visualized. Bowel: There is scattered colonic diverticula without CT evidence of diverticulitis. The appendix is unremarkable. There is no evidence of a small-bowel obstruction. There is a perianal fluid collection measuring approximately 3.2 by 2.2 cm. There is fat stranding involving the adjacent gluteal region. Vascular/Lymphatic: Atherosclerotic changes are noted of the bilateral common femoral arteries, right worse than left. There is an enlarged right pelvic lymph node measuring 1.6 cm (axial series 2, image 29). No significantly enlarged left pelvic sidewall lymph nodes. There are enlarged right inguinal lymph nodes. Reproductive: The  patient is status post hysterectomy. There is no evidence of an ovarian mass. Other: There is a 1.2 cm hyperdense nodule involving the patient's low anterior right abdominal wall. This nodule has slightly increased from prior study and appears to contain a small  cavitation. Musculoskeletal: There are advanced degenerative changes at the L5-S1 level. IMPRESSION: 1. There is a 3.2 x 2.2 cm right perianal abscess. 2. Enlarging right inguinal and right pelvic sidewall lymph nodes of unknown clinical significance. Additionally, there is enlarging 1.2 cm subcutaneous nodule involving the low anterior abdominal wall (previously measuring approximately 0.9 cm). Clinical correlation with direct visualization is recommended. This subcutaneous nodule appears to have a cavitary component and could represent an infectious or malignant etiology. Consider tissue sampling as clinically indicated given its persistence across prior CTs dating back to 03/04/2018. Electronically Signed   By: Constance Holster M.D.   On: 10/06/2018 21:50    ROS:  Pertinent items are noted in HPI.  Blood pressure 131/73, pulse 78, temperature 98.6 F (37 C), temperature source Oral, resp. rate 16, height 5\' 5"  (1.651 m), weight 77.1 kg, SpO2 99 %. Physical Exam: Pleasant morbidly obese black female no acute distress Head is normocephalic, atraumatic Lungs clear to auscultation with good breath sounds bilaterally Heart examination reveals a regular rate and rhythm without S3, S4, murmurs Rectal examination reveals purulent dark drainage from the right perianal abscess.  Rectal examination did not reveal a fistulous communication, although this was limited due to digital examination.  Surrounding induration was present. Abdomen was soft.  A pannus is present.  I could not palpate the subcutaneous nodule that was seen on CT scan.  CT scan images reviewed  Assessment/Plan: Impression: Resolving perianal abscess.  No need for acute surgical invention at this time.  Leukocytosis is resolving. Plan: Have advanced patient to a carb modified diet.  Will continue IV antibiotics.  Will start sitz baths.  Anticipate discharge in next 24 to 48 hours.  Aviva Signs 10/07/2018, 10:58 AM

## 2018-10-07 NOTE — Progress Notes (Signed)
Nutrition Brief Note  Patient identified on the Malnutrition Screening Tool (MST) Report. She presents to hospital with a perianal abscess.   RD working remotely due to [place] restrictions. Talked with patient via telephone.  History of poorly controlled diabetes but A1C is improving:    Ref. Range 05/26/2016 11:48 08/27/2016 15:21 01/08/2018 12:06 03/20/2018 10:20 06/24/2018 11:28  Hemoglobin A1C Latest Ref Range: 4.8 - 5.6 % >14.0 (H) 11.3 (H) 15.0 (H) 11.5 (H) 9.3 (H)     CBG (last 3)  Recent Labs    10/07/18 0014 10/07/18 0720 10/07/18 1134  GLUCAP 119* 135* 166*     Minimal weight changes the past 90 days. Ranging 78-80 kg the past 5 months. Last year she was weighing around 200 lb. Weight loss desirable.   Body mass index is 28.29 kg/m. Patient meets criteria for overweight based on current BMI.   Current diet order is CHO modified, patient is consuming approximately 75% of meals at this time. Labs and medications reviewed.   No nutrition interventions warranted at this time. If nutrition issues arise, please consult RD.    Colman Cater MS,RD,CSG,LDN Office: [phone] Pager: [phone]

## 2018-10-07 NOTE — Progress Notes (Signed)
ANTIBIOTIC CONSULT NOTE-Preliminary  Pharmacy Consult for vancomycin, zosyn Indication: cellulitis  Allergies  Allergen Reactions  . Januvia [Sitagliptin] Other (See Comments)    Caused pancreatitis    Patient Measurements: Height: 5\' 5"  (165.1 cm) Weight: 170 lb (77.1 kg) IBW/kg (Calculated) : 57 Adjusted Body Weight:   Vital Signs: Temp: 98.6 F (37 C) (05/19 0624) Temp Source: Oral (05/19 0624) BP: 131/73 (05/19 0624) Pulse Rate: 78 (05/19 0624)  Labs: Recent Labs    10/06/18 1923  WBC 15.3*  HGB 11.7*  PLT 272  CREATININE 0.47    Estimated Creatinine Clearance: 77.7 mL/min (by C-G formula based on SCr of 0.47 mg/dL).  No results for input(s): VANCOTROUGH, VANCOPEAK, VANCORANDOM, GENTTROUGH, GENTPEAK, GENTRANDOM, TOBRATROUGH, TOBRAPEAK, TOBRARND, AMIKACINPEAK, AMIKACINTROU, AMIKACIN in the last 72 hours.   Microbiology: Recent Results (from the past 720 hour(s))  SARS Coronavirus 2 (CEPHEID - Performed in [place] hospital lab), Hosp Order     Status: None   Collection Time: 10/06/18  7:51 PM  Result Value Ref Range Status   SARS Coronavirus 2 NEGATIVE NEGATIVE Final    Comment: (NOTE) If result is NEGATIVE SARS-CoV-2 target nucleic acids are NOT DETECTED. The SARS-CoV-2 RNA is generally detectable in upper and lower  respiratory specimens during the acute phase of infection. The lowest  concentration of SARS-CoV-2 viral copies this assay can detect is 250  copies / mL. A negative result does not preclude SARS-CoV-2 infection  and should not be used as the sole basis for treatment or other  patient management decisions.  A negative result may occur with  improper specimen collection / handling, submission of specimen other  than nasopharyngeal swab, presence of viral mutation(s) within the  areas targeted by this assay, and inadequate number of viral copies  (<250 copies / mL). A negative result must be combined with clinical  observations, patient  history, and epidemiological information. If result is POSITIVE SARS-CoV-2 target nucleic acids are DETECTED. The SARS-CoV-2 RNA is generally detectable in upper and lower  respiratory specimens dur ing the acute phase of infection.  Positive  results are indicative of active infection with SARS-CoV-2.  Clinical  correlation with patient history and other diagnostic information is  necessary to determine patient infection status.  Positive results do  not rule out bacterial infection or co-infection with other viruses. If result is PRESUMPTIVE POSTIVE SARS-CoV-2 nucleic acids MAY BE PRESENT.   A presumptive positive result was obtained on the submitted specimen  and confirmed on repeat testing.  While 2019 novel coronavirus  (SARS-CoV-2) nucleic acids may be present in the submitted sample  additional confirmatory testing may be necessary for epidemiological  and / or clinical management purposes  to differentiate between  SARS-CoV-2 and other Sarbecovirus currently known to infect humans.  If clinically indicated additional testing with an alternate test  methodology [phone]) is advised. The SARS-CoV-2 RNA is generally  detectable in upper and lower respiratory sp ecimens during the acute  phase of infection. The expected result is Negative. Fact Sheet for Patients:  StrictlyIdeas.no Fact Sheet for Healthcare Providers: BankingDealers.co.za This test is not yet approved or cleared by the [place] FDA and has been authorized for detection and/or diagnosis of SARS-CoV-2 by FDA under an Emergency Use Authorization (EUA).  This EUA will remain in effect (meaning this test can be used) for the duration of the COVID-19 declaration under Section 564(b)(1) of the Act, 21 U.S.C. section 360bbb-3(b)(1), unless the authorization is terminated or revoked sooner. Performed at  [facility], [address] [place], [place] [address]      Medical History: Past Medical History:  Diagnosis Date  . Anxiety and depression   . Diabetes mellitus, type II ([place])    Dr. Dorris Fetch managing  . Frequent headaches chronic   ibuprofen helps some  . History of UTI    Recurrent per pt (approx 3 per year)  . Hyperlipidemia   . Hypertension   . Leukocytosis 12/2015   mild lymphocytosis.  Path smear review reassuring.  Repeat 01/2016 stable.  . Morbid obesity ([place])   . OSA on CPAP     Medications:  Infusions:  . 0.45 % NaCl with KCl 20 mEq / L 100 mL/hr at 10/07/18 0400  . piperacillin-tazobactam (ZOSYN)  IV     Anti-infectives (From admission, onward)   Start     Dose/Rate Route Frequency Ordered Stop   10/07/18 0730  piperacillin-tazobactam (ZOSYN) IVPB 3.375 g     3.375 g 12.5 mL/hr over 240 Minutes Intravenous Every 8 hours 10/07/18 0708     10/07/18 0000  vancomycin (VANCOCIN) IVPB 1000 mg/200 mL premix     1,000 mg 200 mL/hr over 60 Minutes Intravenous  Once 10/06/18 2351 10/07/18 0141   10/07/18 0000  vancomycin (VANCOCIN) 500 mg in sodium chloride 0.9 % 100 mL IVPB     500 mg 100 mL/hr over 60 Minutes Intravenous  Once 10/06/18 2351 10/07/18 0259   10/06/18 2330  vancomycin (VANCOCIN) 1,500 mg in sodium chloride 0.9 % 500 mL IVPB  Status:  Discontinued     1,500 mg 250 mL/hr over 120 Minutes Intravenous  Once 10/06/18 2325 10/06/18 2351   10/06/18 2245  piperacillin-tazobactam (ZOSYN) IVPB 3.375 g     3.375 g 100 mL/hr over 30 Minutes Intravenous  Once 10/06/18 2231 10/06/18 2330   10/06/18 2245  vancomycin (VANCOCIN) IVPB 1000 mg/200 mL premix  Status:  Discontinued     1,000 mg 200 mL/hr over 60 Minutes Intravenous  Once 10/06/18 2231 10/06/18 2325      Assessment: 58 yo female admitted with right sided perirectal abscess.  Pharmacy has been asked to dose vancomycin and zosyn for cellulitis.    Goal of Therapy:  Vancomycin troughs 10-15   Plan:  Preliminary review of pertinent patient information completed.   Protocol will be initiated with dose(s) of vancomycin 1500mg  and zosyn 3.375 grams Q8 hours.  Forestine Na clinical pharmacist will complete review during morning rounds to assess patient and finalize treatment regimen if needed.  Nyra Capes, [facility] 10/07/2018,7:09 AM

## 2018-10-07 NOTE — Progress Notes (Addendum)
Patient Demographics:    Casey Johnston, is a 58 y.o. female, DOB - Aug 08, 1960, FUX:[id number]  Admit date - 10/06/2018   Admitting Physician Reubin Milan, MD  Outpatient Primary MD for the patient is Soyla Dryer, PA-C  LOS - 1  Chief Complaint  Patient presents with  . Abscess        Subjective:    Casey Johnston today has no fevers, no emesis,  No chest pain, perirectal area discomfort is improving,.  Peri-rectal wound is draining spontaneously  Assessment  & Plan :    Principal Problem:   Perianal abscess Active Problems:   Uncontrolled type 2 diabetes mellitus with complication, without long-term current use of insulin (HCC)   Hyperlipidemia   Essential hypertension, benign   GERD (gastroesophageal reflux disease)   Hypokalemia  Brief Summary 58 y.o. female with medical history significant of anxiety and depression, DM2, HAs,  HTN, HLD, obesity, sleep apnea on CPAP, history of recurring a spontaneous perirectal abscesses admitted on 10/06/2018 with progressively more painful right sided perirectal abscess, CT abd/Pelvis IMPRESSION:  There is a 3.2 x 2.2 cm right perianal abscess.  A/p 1)Right Perianal Abscess --- Abscess is spontaneously draining, surgical consult appreciated, sitz bath advised, continue IV Zosyn pending wound culture, check MRSA PCR screen, if negative then may discontinue vancomycin  2)HTN--- stable, continue amlodipine 10 mg daily and lisinopril 40 mg daily,   may use IV Hydralazine 10 mg  Every 4 hours Prn for systolic blood pressure over 160 mmhg  3)DM2- Last hemoglobin A1c was 9.3% on 06/24/2018, reflecting uncontrolled diabetes, continue Lantus 60 units nightly, continue metformin 1000 mg twice daily, Use Novolog/Humalog Sliding scale insulin with Accu-Cheks/Fingersticks as ordered   4)Hyperlipidemia-- Continue atorvastatin 20 mg p.o. daily.  5)GERD-  stable, Continue Protonix 40 mg p.o. daily.  6) tobacco abuse--- smoking cessation advised, nicotine patch as prescribed  Disposition/Need for in-Hospital Stay- patient unable to be discharged at this time due to need for Iv antibiotics (zosyn) pending wound culture  Code Status : FULL  Family Communication:   Na  Disposition Plan  : in 1 to 2 days   Consults  :  Gen surgery  DVT Prophylaxis  :  Heparin - SCDs   Lab Results  Component Value Date   PLT 247 10/07/2018    Inpatient Medications  Scheduled Meds: . amLODipine  10 mg Oral Daily  . atorvastatin  20 mg Oral Daily  . insulin aspart  0-15 Units Subcutaneous TID WC  . insulin glargine  60 Units Subcutaneous QHS  . linaclotide  72 mcg Oral QODAY  . lisinopril  40 mg Oral Daily  . metFORMIN  1,000 mg Oral BID WC  . nicotine  14 mg Transdermal Daily  . [START ON 10/08/2018] pantoprazole  40 mg Oral Daily  . potassium chloride  40 mEq Oral Once   Continuous Infusions: . 0.45 % NaCl with KCl 20 mEq / L 100 mL/hr at 10/07/18 0400  . piperacillin-tazobactam (ZOSYN)  IV 3.375 g (10/07/18 0751)  . [START ON 10/08/2018] vancomycin     PRN Meds:.acetaminophen **OR** acetaminophen, HYDROmorphone (DILAUDID) injection, lidocaine, prochlorperazine   Anti-infectives (From admission, onward)   Start     Dose/Rate Route Frequency Ordered Stop  10/08/18 0000  vancomycin (VANCOCIN) 1,500 mg in sodium chloride 0.9 % 500 mL IVPB     1,500 mg 250 mL/hr over 120 Minutes Intravenous Every 24 hours 10/07/18 0739     10/07/18 0730  piperacillin-tazobactam (ZOSYN) IVPB 3.375 g     3.375 g 12.5 mL/hr over 240 Minutes Intravenous Every 8 hours 10/07/18 0708     10/07/18 0000  vancomycin (VANCOCIN) IVPB 1000 mg/200 mL premix     1,000 mg 200 mL/hr over 60 Minutes Intravenous  Once 10/06/18 2351 10/07/18 0141   10/07/18 0000  vancomycin (VANCOCIN) 500 mg in sodium chloride 0.9 % 100 mL IVPB     500 mg 100 mL/hr over 60 Minutes Intravenous   Once 10/06/18 2351 10/07/18 0259   10/06/18 2330  vancomycin (VANCOCIN) 1,500 mg in sodium chloride 0.9 % 500 mL IVPB  Status:  Discontinued     1,500 mg 250 mL/hr over 120 Minutes Intravenous  Once 10/06/18 2325 10/06/18 2351   10/06/18 2245  piperacillin-tazobactam (ZOSYN) IVPB 3.375 g     3.375 g 100 mL/hr over 30 Minutes Intravenous  Once 10/06/18 2231 10/06/18 2330   10/06/18 2245  vancomycin (VANCOCIN) IVPB 1000 mg/200 mL premix  Status:  Discontinued     1,000 mg 200 mL/hr over 60 Minutes Intravenous  Once 10/06/18 2231 10/06/18 2325        Objective:   Vitals:   10/06/18 2315 10/06/18 2330 10/07/18 0002 10/07/18 0624  BP:  (!) 122/59 (!) 170/69 131/73  Pulse: 85 85 88 78  Resp:  18 16 16   Temp:   98.8 F (37.1 C) 98.6 F (37 C)  TempSrc:   Oral Oral  SpO2: 96% 96% 97% 99%  Weight:      Height:        Wt Readings from Last 3 Encounters:  10/06/18 77.1 kg  07/31/18 78.2 kg  07/30/18 78.3 kg     Intake/Output Summary (Last 24 hours) at 10/07/2018 1432 Last data filed at 10/07/2018 0900 Gross per 24 hour  Intake 809.32 ml  Output 700 ml  Net 109.32 ml   Physical Exam Patient is examined daily including today on 10/07/18 , exams remain the same as of yesterday except that has changed   Gen:- Awake Alert,  In no apparent distress  HEENT:- [place].AT, No sclera icterus Neck-Supple Neck,No JVD,.  Lungs-  CTAB , fair symmetrical air movement CV- S1, S2 normal, regular  Abd-  +ve B.Sounds, Abd Soft, No tenderness,    Extremity:- No  edema, pedal pulses present  Psych-affect is appropriate, oriented x3 Neuro-no new focal deficits, no tremors Skin/Buttock ----please see photos in epic.... Wound is spontaneously draining infected contents... Apparently much less tender and much less indurated   Data Review:   Micro Results Recent Results (from the past 240 hour(s))  SARS Coronavirus 2 (CEPHEID - Performed in [place] hospital lab), Hosp Order     Status: None    Collection Time: 10/06/18  7:51 PM  Result Value Ref Range Status   SARS Coronavirus 2 NEGATIVE NEGATIVE Final    Comment: (NOTE) If result is NEGATIVE SARS-CoV-2 target nucleic acids are NOT DETECTED. The SARS-CoV-2 RNA is generally detectable in upper and lower  respiratory specimens during the acute phase of infection. The lowest  concentration of SARS-CoV-2 viral copies this assay can detect is 250  copies / mL. A negative result does not preclude SARS-CoV-2 infection  and should not be used as the sole basis for  treatment or other  patient management decisions.  A negative result may occur with  improper specimen collection / handling, submission of specimen other  than nasopharyngeal swab, presence of viral mutation(s) within the  areas targeted by this assay, and inadequate number of viral copies  (<250 copies / mL). A negative result must be combined with clinical  observations, patient history, and epidemiological information. If result is POSITIVE SARS-CoV-2 target nucleic acids are DETECTED. The SARS-CoV-2 RNA is generally detectable in upper and lower  respiratory specimens dur ing the acute phase of infection.  Positive  results are indicative of active infection with SARS-CoV-2.  Clinical  correlation with patient history and other diagnostic information is  necessary to determine patient infection status.  Positive results do  not rule out bacterial infection or co-infection with other viruses. If result is PRESUMPTIVE POSTIVE SARS-CoV-2 nucleic acids MAY BE PRESENT.   A presumptive positive result was obtained on the submitted specimen  and confirmed on repeat testing.  While 2019 novel coronavirus  (SARS-CoV-2) nucleic acids may be present in the submitted sample  additional confirmatory testing may be necessary for epidemiological  and / or clinical management purposes  to differentiate between  SARS-CoV-2 and other Sarbecovirus currently known to infect humans.   If clinically indicated additional testing with an alternate test  methodology [phone]) is advised. The SARS-CoV-2 RNA is generally  detectable in upper and lower respiratory sp ecimens during the acute  phase of infection. The expected result is Negative. Fact Sheet for Patients:  StrictlyIdeas.no Fact Sheet for Healthcare Providers: BankingDealers.co.za This test is not yet approved or cleared by the [place] FDA and has been authorized for detection and/or diagnosis of SARS-CoV-2 by FDA under an Emergency Use Authorization (EUA).  This EUA will remain in effect (meaning this test can be used) for the duration of the COVID-19 declaration under Section 564(b)(1) of the Act, 21 U.S.C. section 360bbb-3(b)(1), unless the authorization is terminated or revoked sooner. Performed at [facility], [address], [place], [place] [address]    Radiology Reports Ct Pelvis W Contrast  Result Date: 10/06/2018 CLINICAL DATA:  Perirectal abscess. EXAM: CT PELVIS WITH CONTRAST TECHNIQUE: Multidetector CT imaging of the pelvis was performed using the standard protocol following the bolus administration of intravenous contrast. CONTRAST:  125mL OMNIPAQUE IOHEXOL 300 MG/ML  SOLN COMPARISON:  CT dated 03/04/2018. FINDINGS: Urinary Tract:  No abnormality visualized. Bowel: There is scattered colonic diverticula without CT evidence of diverticulitis. The appendix is unremarkable. There is no evidence of a small-bowel obstruction. There is a perianal fluid collection measuring approximately 3.2 by 2.2 cm. There is fat stranding involving the adjacent gluteal region. Vascular/Lymphatic: Atherosclerotic changes are noted of the bilateral common femoral arteries, right worse than left. There is an enlarged right pelvic lymph node measuring 1.6 cm (axial series 2, image 29). No significantly enlarged left pelvic sidewall lymph nodes. There are enlarged right inguinal  lymph nodes. Reproductive: The patient is status post hysterectomy. There is no evidence of an ovarian mass. Other: There is a 1.2 cm hyperdense nodule involving the patient's low anterior right abdominal wall. This nodule has slightly increased from prior study and appears to contain a small cavitation. Musculoskeletal: There are advanced degenerative changes at the L5-S1 level. IMPRESSION: 1. There is a 3.2 x 2.2 cm right perianal abscess. 2. Enlarging right inguinal and right pelvic sidewall lymph nodes of unknown clinical significance. Additionally, there is enlarging 1.2 cm subcutaneous nodule involving the low anterior abdominal  wall (previously measuring approximately 0.9 cm). Clinical correlation with direct visualization is recommended. This subcutaneous nodule appears to have a cavitary component and could represent an infectious or malignant etiology. Consider tissue sampling as clinically indicated given its persistence across prior CTs dating back to 03/04/2018. Electronically Signed   By: Constance Holster M.D.   On: 10/06/2018 21:50    CBC Recent Labs  Lab 10/06/18 1923 10/07/18 0820  WBC 15.3* 14.4*  HGB 11.7* 11.0*  HCT 35.7* 33.2*  PLT 272 247  MCV 90.8 91.5  MCH 29.8 30.3  MCHC 32.8 33.1  RDW 13.4 13.2  LYMPHSABS  --  5.7*  MONOABS  --  0.6  EOSABS  --  0.2  BASOSABS  --  0.1    Chemistries  Recent Labs  Lab 10/06/18 1923 10/07/18 0820  NA 138 139  K 3.1* 3.6  CL 97* 102  CO2 30 28  GLUCOSE 139* 135*  BUN 9 13  CREATININE 0.47 0.55  CALCIUM 9.2 9.0  MG 1.6*  --   AST 13*  --   ALT 11  --   ALKPHOS 84  --   BILITOT 0.6  --    ------------------------------------------------------------------------------------------------------------------ No results for input(s): CHOL, HDL, LDLCALC, TRIG, CHOLHDL, LDLDIRECT in the last 72 hours.  Lab Results  Component Value Date   HGBA1C 9.3 (H) 06/24/2018    ------------------------------------------------------------------------------------------------------------------ No results for input(s): TSH, T4TOTAL, T3FREE, THYROIDAB in the last 72 hours.  Invalid input(s): FREET3 ------------------------------------------------------------------------------------------------------------------ No results for input(s): VITAMINB12, FOLATE, FERRITIN, TIBC, IRON, RETICCTPCT in the last 72 hours.  Coagulation profile No results for input(s): INR, PROTIME in the last 168 hours.  No results for input(s): DDIMER in the last 72 hours.  Cardiac Enzymes No results for input(s): CKMB, TROPONINI, MYOGLOBIN in the last 168 hours.  Invalid input(s): CK ------------------------------------------------------------------------------------------------------------------ No results found for: BNP  Roxan Hockey M.D on 10/07/2018 at 2:32 PM  Go to [email or web address] - for contact info  [facility] - Office  [phone]

## 2018-10-07 NOTE — Progress Notes (Signed)
Pharmacy Antibiotic Note  Casey Johnston is a 58 y.o. female admitted on 10/06/2018 with cellulitis.  Pharmacy has been consulted for Vancomycin and Zosyn dosing.  Plan: Vancomycin 1500 mg IV every 24 hours.  Goal trough 15-20 mcg/mL.  Zosyn 3.375g IV every 8 hours. Monitor labs, c/s, and patient improvement.  Height: 5\' 5"  (165.1 cm) Weight: 170 lb (77.1 kg) IBW/kg (Calculated) : 57  Temp (24hrs), Avg:98.4 F (36.9 C), Min:97.7 F (36.5 C), Max:98.8 F (37.1 C)  Recent Labs  Lab 10/06/18 1923  WBC 15.3*  CREATININE 0.47    Estimated Creatinine Clearance: 77.7 mL/min (by C-G formula based on SCr of 0.47 mg/dL).    Allergies  Allergen Reactions  . Januvia [Sitagliptin] Other (See Comments)    Caused pancreatitis    Antimicrobials this admission: Vanco 5/19 >>  Zosyn 5/19 >>   Microbiology results: 5/19 WoundCx: pending   Thank you for allowing pharmacy to be a part of this patient's care.  Ramond Craver 10/07/2018 7:40 AM

## 2018-10-08 DIAGNOSIS — Z720 Tobacco use: Secondary | ICD-10-CM

## 2018-10-08 DIAGNOSIS — E876 Hypokalemia: Secondary | ICD-10-CM

## 2018-10-08 DIAGNOSIS — I10 Essential (primary) hypertension: Secondary | ICD-10-CM

## 2018-10-08 DIAGNOSIS — E785 Hyperlipidemia, unspecified: Secondary | ICD-10-CM

## 2018-10-08 DIAGNOSIS — K219 Gastro-esophageal reflux disease without esophagitis: Secondary | ICD-10-CM

## 2018-10-08 LAB — BASIC METABOLIC PANEL
Anion gap: 10 (ref 5–15)
BUN: 14 mg/dL (ref 6–20)
CO2: 25 mmol/L (ref 22–32)
Calcium: 8.9 mg/dL (ref 8.9–10.3)
Chloride: 103 mmol/L (ref 98–111)
Creatinine, Ser: 0.65 mg/dL (ref 0.44–1.00)
GFR calc Af Amer: >60 mL/min (ref >60)
GFR calc non Af Amer: >60 mL/min (ref >60)
Glucose, Bld: 190 mg/dL — ABNORMAL HIGH (ref 70–99)
Potassium: 4 mmol/L (ref 3.5–5.1)
Sodium: 138 mmol/L (ref 135–145)

## 2018-10-08 LAB — CBC WITH DIFFERENTIAL/PLATELET
Abs Immature Granulocytes: 0.05 10*3/uL (ref 0.00–0.07)
Basophils Absolute: 0.1 10*3/uL (ref 0.0–0.1)
Basophils Relative: 1 %
Eosinophils Absolute: 0.1 10*3/uL (ref 0.0–0.5)
Eosinophils Relative: 0 %
HCT: 30.9 % — ABNORMAL LOW (ref 36.0–46.0)
Hemoglobin: 10.2 g/dL — ABNORMAL LOW (ref 12.0–15.0)
Immature Granulocytes: 0 %
Lymphocytes Relative: 38 %
Lymphs Abs: 4.5 10*3/uL — ABNORMAL HIGH (ref 0.7–4.0)
MCH: 29.8 pg (ref 26.0–34.0)
MCHC: 33 g/dL (ref 30.0–36.0)
MCV: 90.4 fL (ref 80.0–100.0)
Monocytes Absolute: 0.5 10*3/uL (ref 0.1–1.0)
Monocytes Relative: 4 %
Neutro Abs: 6.8 10*3/uL (ref 1.7–7.7)
Neutrophils Relative %: 57 %
Platelets: 246 10*3/uL (ref 150–400)
RBC: 3.42 MIL/uL — ABNORMAL LOW (ref 3.87–5.11)
RDW: 13.2 % (ref 11.5–15.5)
WBC: 11.6 10*3/uL — ABNORMAL HIGH (ref 4.0–10.5)
nRBC: 0 % (ref 0.0–0.2)

## 2018-10-08 LAB — GLUCOSE, CAPILLARY
Glucose-Capillary: 155 mg/dL — ABNORMAL HIGH (ref 70–99)
Glucose-Capillary: 155 mg/dL — ABNORMAL HIGH (ref 70–99)

## 2018-10-08 LAB — HIV ANTIBODY (ROUTINE TESTING W REFLEX): HIV Screen 4th Generation wRfx: NONREACTIVE

## 2018-10-08 MED ORDER — AMOXICILLIN-POT CLAVULANATE 875-125 MG PO TABS
1.0000 | ORAL_TABLET | Freq: Two times a day (BID) | ORAL | Status: DC
  Administered 2018-10-08: 1 via ORAL
  Filled 2018-10-08: qty 1

## 2018-10-08 MED ORDER — AMOXICILLIN-POT CLAVULANATE 875-125 MG PO TABS: 1.0000 | ORAL_TABLET | Freq: Two times a day (BID) | ORAL | 0 refills | Status: AC

## 2018-10-08 MED ORDER — FLUCONAZOLE 150 MG PO TABS
150.0000 mg | ORAL_TABLET | Freq: Once | ORAL | Status: AC
  Administered 2018-10-08: 150 mg via ORAL
  Filled 2018-10-08: qty 1

## 2018-10-08 MED ORDER — OXYCODONE-ACETAMINOPHEN 5-325 MG PO TABS: 1.0000 | ORAL_TABLET | Freq: Four times a day (QID) | ORAL | 0 refills | Status: DC | PRN

## 2018-10-08 MED ORDER — OXYCODONE-ACETAMINOPHEN 5-325 MG PO TABS: 1.0000 | ORAL_TABLET | Freq: Four times a day (QID) | ORAL | Status: DC | PRN

## 2018-10-08 NOTE — Discharge Summary (Signed)
Physician Discharge Summary  Casey Johnston OMV:[id number] DOB: 02-03-1961 DOA: 10/06/2018  PCP: Soyla Dryer, PA-C  Admit date: 10/06/2018 Discharge date: 10/08/2018  Admitted From: Home  Disposition:  Home   Recommendations for Outpatient Follow-up and new medication changes:  1. Follow up with Soyla Dryer PA-C in 7 days.  2. Continue antibiotic therapy with Augmentin for a total of 10 days.  3. Please follow on 1.2 cm lower abdomen subcutaneous nodule.  4. Patient placed on oxycodone/ acetaminophen  for pain control. 5. Patient received one dose of 150 mg diflucan for vaginal candida prophylaxis.   Home Health: no   Equipment/Devices: no    Discharge Condition: stable  CODE STATUS: ful  Diet recommendation: heart healthy and diabetic prudent.   Brief/Interim Summary: 58 year old female who presented with a perirectal abscess.  She does have significant past medical history for anxiety, depression, type II diabetes mellitus, dyslipidemia, hypertension and morbid obesity.  She has history of recurrent spontaneous perirectal abscesses.  She was referred to the hospital by her primary care physician due to worsening perirectal abscess.  On her initial physical examination her temperature was 98.6, heart rate 98, respiratory rate 18, blood pressure 173/76, oxygen saturation 98% on room air.  Her lungs were clear to auscultation, heart S1-S2 present and rhythmic, the abdomen was obese, soft, she had a large perirectal abscess which was draining purulent fluid.  Sodium 138, potassium 3.1, chloride 97, bicarb 30, glucose 139, BUN 9, creatinine 0.47, white count 15.3, hemoglobin 11.7, hematocrit 35.7, platelets 272.  SARS COVID-19 was negative.  Pelvic CT showed a 3.2 x 2.2 cm right, anal abscess.  Patient was admitted to the hospital working diagnosis of sepsis due to perirectal abscess.  1.  Sepsis due to perirectal abscess.  Patient was admitted to the hospital, she was placed on  intravenous fluids, analgesics and broad spectrum IV antibiotic therapy with IV vancomycin and Zosyn.  Her cultures remain no growth, Gram stain with gram-negative, positive rods, moderate gram-positive cocci in clusters, her white cell count has been trending down.  She has remained afebrile.  She was seen by Dr. Arnoldo Morale from surgery, with recommendations to continue medical therapy.  No indication for further surgical drainage since the abscess has spontaneously drained.  Patient will continue antibiotic therapy with Augmentin, analgesics and sitz baths.  Follow-up as an outpatient.  Discharge white cell count 11.6.  2.  Hypokalemia.  Potassium was corrected with potassium chloride, discharge potassium 4.0, preserved renal function with a serum creatinine 0.65.  3.  Hypertension.  Continue blood pressure control with amlodipine and lisinopril.  4.  Uncontrolled type 2 diabetes mellitus.  Patient was placed on insulin sliding scale for glucose coverage and monitoring, continue basal insulin therapy along with metformin.  Her last hemoglobin A1c was 9.3.   5.  Dyslipidemia.  Continue atorvastatin.  6.  GERD.  Patient will continue pantoprazole.  7.  Tobacco abuse.  Smoking cessation counseling.  8.  1.2 cm subcutaneous nodule.  Incidental finding, nodule involving the low anterior abdominal wall, previously measuring 0.9 cm, the nodule appears to have cavitary component.  Consider tissue sampling as an outpatient.  Discharge Diagnoses:  Principal Problem:   Perianal abscess Active Problems:   Uncontrolled type 2 diabetes mellitus with complication, without long-term current use of insulin (HCC)   Hyperlipidemia   Essential hypertension, benign   GERD (gastroesophageal reflux disease)   Hypokalemia   Tobacco abuse disorder    Discharge Instructions   Allergies as  of 10/08/2018      Reactions   Januvia [sitagliptin] Other (See Comments)   Caused pancreatitis      Medication List     TAKE these medications   amLODipine 10 MG tablet Commonly known as:  NORVASC Take 1 tablet (10 mg total) by mouth daily.   amoxicillin-clavulanate 875-125 MG tablet Commonly known as:  AUGMENTIN Take 1 tablet by mouth every 12 (twelve) hours for 8 days.   atorvastatin 20 MG tablet Commonly known as:  LIPITOR Take 1 tablet (20 mg total) by mouth daily.   FISH OIL PO Take 1 capsule by mouth daily.   insulin glargine 100 UNIT/ML injection Commonly known as:  LANTUS Inject 60 Units into the skin at bedtime.   lidocaine 2 % solution Commonly known as:  XYLOCAINE Use as directed 15 mLs in the mouth or throat every 6 (six) hours as needed for mouth pain. What changed:    reasons to take this  additional instructions  Another medication with the same name was removed. Continue taking this medication, and follow the directions you see here.   linaclotide 72 MCG capsule Commonly known as:  Linzess Take 1 capsule (72 mcg total) by mouth daily before breakfast. What changed:  when to take this   lisinopril 20 MG tablet Commonly known as:  ZESTRIL Take 2 tablets (40 mg total) by mouth daily.   metFORMIN 1000 MG tablet Commonly known as:  GLUCOPHAGE Take 1 tablet (1,000 mg total) by mouth 2 (two) times daily with a meal.   oxyCODONE-acetaminophen 5-325 MG tablet Commonly known as:  PERCOCET/ROXICET Take 1 tablet by mouth every 6 (six) hours as needed for severe pain.   pantoprazole 40 MG tablet Commonly known as:  PROTONIX Take 40 mg by mouth 2 (two) times a day.       Allergies  Allergen Reactions  . Januvia [Sitagliptin] Other (See Comments)    Caused pancreatitis    Consultations:  Surgery    Procedures/Studies: Ct Pelvis W Contrast  Result Date: 10/06/2018 CLINICAL DATA:  Perirectal abscess. EXAM: CT PELVIS WITH CONTRAST TECHNIQUE: Multidetector CT imaging of the pelvis was performed using the standard protocol following the bolus administration of  intravenous contrast. CONTRAST:  167mL OMNIPAQUE IOHEXOL 300 MG/ML  SOLN COMPARISON:  CT dated 03/04/2018. FINDINGS: Urinary Tract:  No abnormality visualized. Bowel: There is scattered colonic diverticula without CT evidence of diverticulitis. The appendix is unremarkable. There is no evidence of a small-bowel obstruction. There is a perianal fluid collection measuring approximately 3.2 by 2.2 cm. There is fat stranding involving the adjacent gluteal region. Vascular/Lymphatic: Atherosclerotic changes are noted of the bilateral common femoral arteries, right worse than left. There is an enlarged right pelvic lymph node measuring 1.6 cm (axial series 2, image 29). No significantly enlarged left pelvic sidewall lymph nodes. There are enlarged right inguinal lymph nodes. Reproductive: The patient is status post hysterectomy. There is no evidence of an ovarian mass. Other: There is a 1.2 cm hyperdense nodule involving the patient's low anterior right abdominal wall. This nodule has slightly increased from prior study and appears to contain a small cavitation. Musculoskeletal: There are advanced degenerative changes at the L5-S1 level. IMPRESSION: 1. There is a 3.2 x 2.2 cm right perianal abscess. 2. Enlarging right inguinal and right pelvic sidewall lymph nodes of unknown clinical significance. Additionally, there is enlarging 1.2 cm subcutaneous nodule involving the low anterior abdominal wall (previously measuring approximately 0.9 cm). Clinical correlation with direct visualization is recommended.  This subcutaneous nodule appears to have a cavitary component and could represent an infectious or malignant etiology. Consider tissue sampling as clinically indicated given its persistence across prior CTs dating back to 03/04/2018. Electronically Signed   By: Constance Holster M.D.   On: 10/06/2018 21:50      Procedures:   Subjective: Patient is feeling well, no nausea or vomiting, no chest pain or dyspnea.    Discharge Exam: Vitals:   10/07/18 2118 10/08/18 0606  BP: 125/64 127/62  Pulse: 78 69  Resp: 16 16  Temp: 98.7 F (37.1 C) 98.3 F (36.8 C)  SpO2: 96% 99%   Vitals:   10/07/18 0624 10/07/18 2010 10/07/18 2118 10/08/18 0606  BP: 131/73  125/64 127/62  Pulse: 78  78 69  Resp: 16  16 16   Temp: 98.6 F (37 C)  98.7 F (37.1 C) 98.3 F (36.8 C)  TempSrc: Oral  Oral Oral  SpO2: 99% 91% 96% 99%  Weight:      Height:        General: Not in pain or dyspnea  Neurology: Awake and alert, non focal  E ENT: no pallor, no icterus, oral mucosa moist Cardiovascular: No JVD. S1-S2 present, rhythmic, no gallops, rubs, or murmurs. No lower extremity edema. Pulmonary: positive breath sounds bilaterally, adequate air movement, no wheezing, rhonchi or rales. Gastrointestinal. Abdomen protuberant, no organomegaly, non tender, no rebound or guarding Skin. Draining perirectal abscess/ lower abdomen, left paraumbilical line small subcutaneus hard nodule, non tender.  Musculoskeletal: no joint deformities   The results of significant diagnostics from this hospitalization (including imaging, microbiology, ancillary and laboratory) are listed below for reference.     Microbiology: Recent Results (from the past 240 hour(s))  SARS Coronavirus 2 (CEPHEID - Performed in [place] hospital lab), Hosp Order     Status: None   Collection Time: 10/06/18  7:51 PM  Result Value Ref Range Status   SARS Coronavirus 2 NEGATIVE NEGATIVE Final    Comment: (NOTE) If result is NEGATIVE SARS-CoV-2 target nucleic acids are NOT DETECTED. The SARS-CoV-2 RNA is generally detectable in upper and lower  respiratory specimens during the acute phase of infection. The lowest  concentration of SARS-CoV-2 viral copies this assay can detect is 250  copies / mL. A negative result does not preclude SARS-CoV-2 infection  and should not be used as the sole basis for treatment or other  patient management decisions.  A  negative result may occur with  improper specimen collection / handling, submission of specimen other  than nasopharyngeal swab, presence of viral mutation(s) within the  areas targeted by this assay, and inadequate number of viral copies  (<250 copies / mL). A negative result must be combined with clinical  observations, patient history, and epidemiological information. If result is POSITIVE SARS-CoV-2 target nucleic acids are DETECTED. The SARS-CoV-2 RNA is generally detectable in upper and lower  respiratory specimens dur ing the acute phase of infection.  Positive  results are indicative of active infection with SARS-CoV-2.  Clinical  correlation with patient history and other diagnostic information is  necessary to determine patient infection status.  Positive results do  not rule out bacterial infection or co-infection with other viruses. If result is PRESUMPTIVE POSTIVE SARS-CoV-2 nucleic acids MAY BE PRESENT.   A presumptive positive result was obtained on the submitted specimen  and confirmed on repeat testing.  While 2019 novel coronavirus  (SARS-CoV-2) nucleic acids may be present in the submitted sample  additional confirmatory testing  may be necessary for epidemiological  and / or clinical management purposes  to differentiate between  SARS-CoV-2 and other Sarbecovirus currently known to infect humans.  If clinically indicated additional testing with an alternate test  methodology [phone]) is advised. The SARS-CoV-2 RNA is generally  detectable in upper and lower respiratory sp ecimens during the acute  phase of infection. The expected result is Negative. Fact Sheet for Patients:  StrictlyIdeas.no Fact Sheet for Healthcare Providers: BankingDealers.co.za This test is not yet approved or cleared by the [place] FDA and has been authorized for detection and/or diagnosis of SARS-CoV-2 by FDA under an Emergency Use  Authorization (EUA).  This EUA will remain in effect (meaning this test can be used) for the duration of the COVID-19 declaration under Section 564(b)(1) of the Act, 21 U.S.C. section 360bbb-3(b)(1), unless the authorization is terminated or revoked sooner. Performed at [facility], [address], [place], [place] [address]   Aerobic Culture (superficial specimen)     Status: None (Preliminary result)   Collection Time: 10/07/18  2:14 AM  Result Value Ref Range Status   Specimen Description   Final    ABSCESS Performed at [facility], [address], [place], [place] [address]    Special Requests   Final    Immunocompromised Performed at [facility], [address] [place], [place] [address]    Gram Stain   Final    FEW WBC PRESENT, PREDOMINANTLY PMN ABUNDANT GRAM NEGATIVE RODS ABUNDANT GRAM POSITIVE RODS MODERATE GRAM POSITIVE COCCI IN PAIRS IN CLUSTERS Performed at [facility], [place] [address], [place], [place] [address]    Culture PENDING  Incomplete   Report Status PENDING  Incomplete  MRSA PCR Screening     Status: None   Collection Time: 10/07/18  2:16 PM  Result Value Ref Range Status   MRSA by PCR NEGATIVE NEGATIVE Final    Comment:        The GeneXpert MRSA Assay (FDA approved for NASAL specimens only), is one component of a comprehensive MRSA colonization surveillance program. It is not intended to diagnose MRSA infection nor to guide or monitor treatment for MRSA infections. Performed at [facility], [address] [place], [place] [address]      Labs: BNP (last 3 results) No results for input(s): BNP in the last 8760 hours. Basic Metabolic Panel: Recent Labs  Lab 10/06/18 1923 10/07/18 0820 10/08/18 0547  NA 138 139 138  K 3.1* 3.6 4.0  CL 97* 102 103  CO2 30 28 25   GLUCOSE 139* 135* 190*  BUN 9 13 14   CREATININE 0.47 0.55 0.65  CALCIUM 9.2 9.0 8.9  MG 1.6*  --   --   PHOS 3.5  --   --    Liver Function Tests: Recent Labs  Lab  10/06/18 1923  AST 13*  ALT 11  ALKPHOS 84  BILITOT 0.6  PROT 7.9  ALBUMIN 3.5   No results for input(s): LIPASE, AMYLASE in the last 168 hours. No results for input(s): AMMONIA in the last 168 hours. CBC: Recent Labs  Lab 10/06/18 1923 10/07/18 0820 10/08/18 0547  WBC 15.3* 14.4* 11.6*  NEUTROABS  --  7.8* 6.8  HGB 11.7* 11.0* 10.2*  HCT 35.7* 33.2* 30.9*  MCV 90.8 91.5 90.4  PLT 272 247 246   Cardiac Enzymes: No results for input(s): CKTOTAL, CKMB, CKMBINDEX, TROPONINI in the last 168 hours. BNP: Invalid input(s): POCBNP CBG: Recent Labs  Lab 10/07/18 0720 10/07/18 1134  10/07/18 1623 10/07/18 2120 10/08/18 0741  GLUCAP 135* 166* 133* 111* 155*   D-Dimer No results for input(s): DDIMER in the last 72 hours. Hgb A1c No results for input(s): HGBA1C in the last 72 hours. Lipid Profile No results for input(s): CHOL, HDL, LDLCALC, TRIG, CHOLHDL, LDLDIRECT in the last 72 hours. Thyroid function studies No results for input(s): TSH, T4TOTAL, T3FREE, THYROIDAB in the last 72 hours.  Invalid input(s): FREET3 Anemia work up No results for input(s): VITAMINB12, FOLATE, FERRITIN, TIBC, IRON, RETICCTPCT in the last 72 hours. Urinalysis    Component Value Date/Time   COLORURINE YELLOW 05/21/2018 2052   APPEARANCEUR CLEAR 05/21/2018 2052   LABSPEC 1.028 05/21/2018 2052   PHURINE 5.0 05/21/2018 2052   GLUCOSEU >=500 (A) 05/21/2018 2052   HGBUR NEGATIVE 05/21/2018 2052   BILIRUBINUR NEGATIVE 05/21/2018 2052   [place] NEGATIVE 05/21/2018 2052   PROTEINUR NEGATIVE 05/21/2018 2052   NITRITE NEGATIVE 05/21/2018 2052   LEUKOCYTESUR NEGATIVE 05/21/2018 2052   Sepsis Labs Invalid input(s): PROCALCITONIN,  WBC,  LACTICIDVEN Microbiology Recent Results (from the past 240 hour(s))  SARS Coronavirus 2 (CEPHEID - Performed in [place] hospital lab), Hosp Order     Status: None   Collection Time: 10/06/18  7:51 PM  Result Value Ref Range Status   SARS Coronavirus 2  NEGATIVE NEGATIVE Final    Comment: (NOTE) If result is NEGATIVE SARS-CoV-2 target nucleic acids are NOT DETECTED. The SARS-CoV-2 RNA is generally detectable in upper and lower  respiratory specimens during the acute phase of infection. The lowest  concentration of SARS-CoV-2 viral copies this assay can detect is 250  copies / mL. A negative result does not preclude SARS-CoV-2 infection  and should not be used as the sole basis for treatment or other  patient management decisions.  A negative result may occur with  improper specimen collection / handling, submission of specimen other  than nasopharyngeal swab, presence of viral mutation(s) within the  areas targeted by this assay, and inadequate number of viral copies  (<250 copies / mL). A negative result must be combined with clinical  observations, patient history, and epidemiological information. If result is POSITIVE SARS-CoV-2 target nucleic acids are DETECTED. The SARS-CoV-2 RNA is generally detectable in upper and lower  respiratory specimens dur ing the acute phase of infection.  Positive  results are indicative of active infection with SARS-CoV-2.  Clinical  correlation with patient history and other diagnostic information is  necessary to determine patient infection status.  Positive results do  not rule out bacterial infection or co-infection with other viruses. If result is PRESUMPTIVE POSTIVE SARS-CoV-2 nucleic acids MAY BE PRESENT.   A presumptive positive result was obtained on the submitted specimen  and confirmed on repeat testing.  While 2019 novel coronavirus  (SARS-CoV-2) nucleic acids may be present in the submitted sample  additional confirmatory testing may be necessary for epidemiological  and / or clinical management purposes  to differentiate between  SARS-CoV-2 and other Sarbecovirus currently known to infect humans.  If clinically indicated additional testing with an alternate test  methodology [phone])  is advised. The SARS-CoV-2 RNA is generally  detectable in upper and lower respiratory sp ecimens during the acute  phase of infection. The expected result is Negative. Fact Sheet for Patients:  StrictlyIdeas.no Fact Sheet for Healthcare Providers: BankingDealers.co.za This test is not yet approved or cleared by the [place] FDA and has been authorized for detection and/or diagnosis of SARS-CoV-2 by FDA under an Emergency Use  Authorization (EUA).  This EUA will remain in effect (meaning this test can be used) for the duration of the COVID-19 declaration under Section 564(b)(1) of the Act, 21 U.S.C. section 360bbb-3(b)(1), unless the authorization is terminated or revoked sooner. Performed at [facility], [address], [place], [place] [address]   Aerobic Culture (superficial specimen)     Status: None (Preliminary result)   Collection Time: 10/07/18  2:14 AM  Result Value Ref Range Status   Specimen Description   Final    ABSCESS Performed at [facility], [address], [place], [place] [address]    Special Requests   Final    Immunocompromised Performed at [facility], [address] [place], [place] [address]    Gram Stain   Final    FEW WBC PRESENT, PREDOMINANTLY PMN ABUNDANT GRAM NEGATIVE RODS ABUNDANT GRAM POSITIVE RODS MODERATE GRAM POSITIVE COCCI IN PAIRS IN CLUSTERS Performed at [facility], [place] [address], [place], [place] [address]    Culture PENDING  Incomplete   Report Status PENDING  Incomplete  MRSA PCR Screening     Status: None   Collection Time: 10/07/18  2:16 PM  Result Value Ref Range Status   MRSA by PCR NEGATIVE NEGATIVE Final    Comment:        The GeneXpert MRSA Assay (FDA approved for NASAL specimens only), is one component of a comprehensive MRSA colonization surveillance program. It is not intended to diagnose MRSA infection nor to guide or monitor treatment for MRSA  infections. Performed at [facility], [address], [place], [place] [address]      Time coordinating discharge: 45 minutes  SIGNED:   Tawni Millers, MD  [facility] 10/08/2018, 9:06 AM  '

## 2018-10-08 NOTE — Plan of Care (Signed)

## 2018-10-08 NOTE — TOC Transition Note (Signed)
Transition of Care [facility]) - CM/SW Discharge Note   Patient Details  Name: Casey Johnston MRN: [id number] Date of Birth: 1960/07/04  [facility] CM/SW Contact:  Boneta Lucks, RN Phone Number: 10/08/2018, 11:36 AM   Clinical Narrative:   Patient admitted for Perianal abscess. Patient does not have insurance, working with [employer]ito [employer] in [employer] for [employer]. Pt has follow up appointment with PCP. Reviewed prices on discharge medication, discussed prices on [facility]. Patient understands and has smart phone to use this app.     Barriers to Discharge: Inadequate or no insurance   Patient Goals and CMS Choice        Expected Discharge Plan and Services         Living arrangements for the past 2 months: Single Family Home Expected Discharge Date: 10/08/18                                    Prior Living Arrangements/Services Living arrangements for the past 2 months: Single Family Home Lives with:: Self                   Activities of Daily Living Home Assistive Devices/Equipment: CBG Meter, CPAP ADL Screening (condition at time of admission) Patient's cognitive ability adequate to safely complete daily activities?: Yes Is the patient deaf or have difficulty hearing?: No Does the patient have difficulty seeing, even when wearing glasses/contacts?: No Does the patient have difficulty concentrating, remembering, or making decisions?: No Patient able to express need for assistance with ADLs?: Yes Does the patient have difficulty dressing or bathing?: No Independently performs ADLs?: Yes (appropriate for developmental age) Does the patient have difficulty walking or climbing stairs?: No Weakness of Legs: None Weakness of Arms/Hands: None  Permission Sought/Granted                  Emotional Assessment Appearance:: Appears older than stated age   Affect (typically observed): Accepting, Pleasant Orientation: : Oriented  to Self, Oriented to  Time, Oriented to Place, Oriented to Situation Alcohol / Substance Use: Not Applicable Psych Involvement: No (comment)  Admission diagnosis:  Cellulitis of buttock [L03.317] Perineal abscess [L02.215] Patient Active Problem List   Diagnosis Date Noted  . Tobacco abuse disorder 10/07/2018  . Perianal abscess 10/06/2018  . Hypokalemia 10/06/2018  . Nausea without vomiting 07/30/2018  . Unintentional weight loss 07/30/2018  . Constipation 06/05/2018  . GERD (gastroesophageal reflux disease) 06/05/2018  . Abdominal pain 06/05/2018  . Uncontrolled type 2 diabetes mellitus with complication, without long-term current use of insulin ([place]) 08/22/2015  . Hyperlipidemia 08/22/2015  . Essential hypertension, benign 08/22/2015  . Morbid obesity due to excess calories ([place]) 08/22/2015  . Personal history of noncompliance with medical treatment, presenting hazards to health 08/22/2015   PCP:  Soyla Dryer, PA-C Pharmacy:   [facility] [address], [place] [address] Phone: [phone] Fax: [phone]  [facility] [place], [place] [address] [place] [address] Phone: [phone] Fax: [phone]  [facility], [place], [place] [address], [place] [address] Phone: [phone] Fax: [phone]         Readmission Risk Interventions No flowsheet data found.     Final next level of care: Home/Self  Care Barriers to Discharge: Inadequate or no insurance

## 2018-10-09 LAB — AEROBIC CULTURE W GRAM STAIN (SUPERFICIAL SPECIMEN)

## 2018-10-10 ENCOUNTER — Other Ambulatory Visit: Payer: Self-pay | Admitting: Physician Assistant

## 2018-10-14 ENCOUNTER — Other Ambulatory Visit: Payer: Self-pay | Admitting: Physician Assistant

## 2018-10-14 ENCOUNTER — Telehealth: Payer: Self-pay | Admitting: *Deleted

## 2018-10-14 MED ORDER — AMLODIPINE BESYLATE 10 MG PO TABS: 10.0000 mg | ORAL_TABLET | Freq: Every day | ORAL | 0 refills | Status: DC

## 2018-10-14 MED ORDER — METFORMIN HCL 1000 MG PO TABS: 1000.0000 mg | ORAL_TABLET | Freq: Two times a day (BID) | ORAL | 1 refills | Status: DC

## 2018-10-14 MED ORDER — ATORVASTATIN CALCIUM 20 MG PO TABS: 20.0000 mg | ORAL_TABLET | Freq: Every day | ORAL | 2 refills | Status: DC

## 2018-10-14 NOTE — Telephone Encounter (Signed)
Called pt to inform her about COVID 19 testing required before her procedure.  Pt is aware that her appointment is scheduled for 10/16/2018.  Pt was informed that she would be quarantined after testing.  Pt voiced understanding.

## 2018-10-16 ENCOUNTER — Other Ambulatory Visit: Payer: Self-pay

## 2018-10-16 ENCOUNTER — Other Ambulatory Visit (HOSPITAL_COMMUNITY)
Admission: RE | Admit: 2018-10-16 | Discharge: 2018-10-16 | Disposition: A | Discharge status: Home / Self Care | Payer: HRSA Program | Source: Ambulatory Visit | Attending: Gastroenterology | Admitting: Gastroenterology

## 2018-10-16 DIAGNOSIS — Z1159 Encounter for screening for other viral diseases: Secondary | ICD-10-CM | POA: Diagnosis present

## 2018-10-17 LAB — NOVEL CORONAVIRUS, NAA (HOSP ORDER, SEND-OUT TO REF LAB; TAT 18-24 HRS): SARS-CoV-2, NAA: NOT DETECTED

## 2018-10-20 ENCOUNTER — Encounter (HOSPITAL_COMMUNITY): Payer: Self-pay | Admitting: *Deleted

## 2018-10-20 ENCOUNTER — Ambulatory Visit (HOSPITAL_COMMUNITY)
Admission: RE | Admit: 2018-10-20 | Discharge: 2018-10-20 | Disposition: A | Discharge status: Home / Self Care | Payer: Self-pay | Attending: Gastroenterology | Admitting: Gastroenterology

## 2018-10-20 ENCOUNTER — Encounter (HOSPITAL_COMMUNITY)
Admission: RE | Disposition: A | Discharge status: Home / Self Care | Payer: Self-pay | Source: Home / Self Care | Attending: Gastroenterology

## 2018-10-20 ENCOUNTER — Other Ambulatory Visit: Payer: Self-pay

## 2018-10-20 DIAGNOSIS — E119 Type 2 diabetes mellitus without complications: Secondary | ICD-10-CM | POA: Insufficient documentation

## 2018-10-20 DIAGNOSIS — K317 Polyp of stomach and duodenum: Secondary | ICD-10-CM | POA: Insufficient documentation

## 2018-10-20 DIAGNOSIS — K648 Other hemorrhoids: Secondary | ICD-10-CM | POA: Insufficient documentation

## 2018-10-20 DIAGNOSIS — Z794 Long term (current) use of insulin: Secondary | ICD-10-CM | POA: Insufficient documentation

## 2018-10-20 DIAGNOSIS — D124 Benign neoplasm of descending colon: Secondary | ICD-10-CM | POA: Insufficient documentation

## 2018-10-20 DIAGNOSIS — K219 Gastro-esophageal reflux disease without esophagitis: Secondary | ICD-10-CM | POA: Insufficient documentation

## 2018-10-20 DIAGNOSIS — Z888 Allergy status to other drugs, medicaments and biological substances status: Secondary | ICD-10-CM | POA: Insufficient documentation

## 2018-10-20 DIAGNOSIS — R634 Abnormal weight loss: Secondary | ICD-10-CM

## 2018-10-20 DIAGNOSIS — Z6828 Body mass index (BMI) 28.0-28.9, adult: Secondary | ICD-10-CM | POA: Insufficient documentation

## 2018-10-20 DIAGNOSIS — R1013 Epigastric pain: Secondary | ICD-10-CM

## 2018-10-20 DIAGNOSIS — K5730 Diverticulosis of large intestine without perforation or abscess without bleeding: Secondary | ICD-10-CM | POA: Insufficient documentation

## 2018-10-20 DIAGNOSIS — Z1211 Encounter for screening for malignant neoplasm of colon: Secondary | ICD-10-CM | POA: Insufficient documentation

## 2018-10-20 DIAGNOSIS — G4733 Obstructive sleep apnea (adult) (pediatric): Secondary | ICD-10-CM | POA: Insufficient documentation

## 2018-10-20 DIAGNOSIS — I10 Essential (primary) hypertension: Secondary | ICD-10-CM | POA: Insufficient documentation

## 2018-10-20 DIAGNOSIS — Z86010 Personal history of colonic polyps: Secondary | ICD-10-CM | POA: Insufficient documentation

## 2018-10-20 DIAGNOSIS — D122 Benign neoplasm of ascending colon: Secondary | ICD-10-CM | POA: Insufficient documentation

## 2018-10-20 DIAGNOSIS — K2970 Gastritis, unspecified, without bleeding: Secondary | ICD-10-CM | POA: Insufficient documentation

## 2018-10-20 DIAGNOSIS — D123 Benign neoplasm of transverse colon: Secondary | ICD-10-CM | POA: Insufficient documentation

## 2018-10-20 DIAGNOSIS — E785 Hyperlipidemia, unspecified: Secondary | ICD-10-CM | POA: Insufficient documentation

## 2018-10-20 DIAGNOSIS — Z79899 Other long term (current) drug therapy: Secondary | ICD-10-CM | POA: Insufficient documentation

## 2018-10-20 DIAGNOSIS — D125 Benign neoplasm of sigmoid colon: Secondary | ICD-10-CM | POA: Insufficient documentation

## 2018-10-20 DIAGNOSIS — K644 Residual hemorrhoidal skin tags: Secondary | ICD-10-CM | POA: Insufficient documentation

## 2018-10-20 DIAGNOSIS — R110 Nausea: Secondary | ICD-10-CM

## 2018-10-20 HISTORY — PX: POLYPECTOMY: SHX5525

## 2018-10-20 HISTORY — PX: BIOPSY: SHX5522

## 2018-10-20 HISTORY — PX: COLONOSCOPY: SHX5424

## 2018-10-20 HISTORY — PX: ESOPHAGOGASTRODUODENOSCOPY: SHX5428

## 2018-10-20 LAB — GLUCOSE, CAPILLARY: Glucose-Capillary: 99 mg/dL (ref 70–99)

## 2018-10-20 SURGERY — COLONOSCOPY
Anesthesia: Moderate Sedation

## 2018-10-20 MED ORDER — MEPERIDINE HCL 100 MG/ML IJ SOLN
INTRAMUSCULAR | Status: DC | PRN
  Administered 2018-10-20 (×3): 25 mg

## 2018-10-20 MED ORDER — SODIUM CHLORIDE 0.9 % IV SOLN
INTRAVENOUS | Status: DC
  Administered 2018-10-20: 10:00:00 via INTRAVENOUS

## 2018-10-20 MED ORDER — MIDAZOLAM HCL 5 MG/5ML IJ SOLN
INTRAMUSCULAR | Status: DC | PRN
  Administered 2018-10-20 (×2): 2 mg via INTRAVENOUS
  Administered 2018-10-20: 1 mg via INTRAVENOUS

## 2018-10-20 MED ORDER — LUBIPROSTONE 24 MCG PO CAPS: ORAL_CAPSULE | ORAL | 11 refills | Status: DC

## 2018-10-20 MED ORDER — MIDAZOLAM HCL 5 MG/5ML IJ SOLN
INTRAMUSCULAR | Status: AC
  Filled 2018-10-20: qty 10

## 2018-10-20 MED ORDER — MEPERIDINE HCL 100 MG/ML IJ SOLN
INTRAMUSCULAR | Status: AC
  Filled 2018-10-20: qty 2

## 2018-10-20 MED ORDER — LIDOCAINE VISCOUS HCL 2 % MT SOLN
OROMUCOSAL | Status: AC
  Filled 2018-10-20: qty 15

## 2018-10-20 MED ORDER — PANTOPRAZOLE SODIUM 40 MG PO TBEC: DELAYED_RELEASE_TABLET | ORAL | 11 refills | Status: DC

## 2018-10-20 MED ORDER — LIDOCAINE VISCOUS HCL 2 % MT SOLN
OROMUCOSAL | Status: DC | PRN
  Administered 2018-10-20: 1 via OROMUCOSAL

## 2018-10-20 NOTE — Op Note (Signed)
[facility] Patient Name: Casey Johnston Procedure Date: 10/20/2018 11:22 AM MRN: [id number] Date of Birth: 23-Dec-1960 Attending MD: Barney Drain MD, MD CSN: [id number] Age: 58 Admit Type: Outpatient Procedure:                Upper GI endoscopy WITH POLYPECTOMY/COLD FORCEPS                            BIOPSY Indications:              Abdominal pain in the left upper quadrant,                            Functional Dyspepsia Providers:                Barney Drain MD, MD, Janeece Riggers, RN, Raphael Gibney, Technician, Randa Spike, Technician Referring MD:             Soyla Dryer PA-C, PA Medicines:                TCS + Meperidine 25 mg IV Complications:            No immediate complications. Estimated Blood Loss:     Estimated blood loss was minimal. Procedure:                Pre-Anesthesia Assessment:                           - Prior to the procedure, a History and Physical                            was performed, and patient medications and                            allergies were reviewed. The patient's tolerance of                            previous anesthesia was also reviewed. The risks                            and benefits of the procedure and the sedation                            options and risks were discussed with the patient.                            All questions were answered, and informed consent                            was obtained. Prior Anticoagulants: The patient has                            taken no previous anticoagulant or antiplatelet  agents except for NSAID medication. ASA Grade                            Assessment: II - A patient with mild systemic                            disease. After reviewing the risks and benefits,                            the patient was deemed in satisfactory condition to                            undergo the procedure. After obtaining informed                  consent, the endoscope was passed under direct                            vision. Throughout the procedure, the patient's                            blood pressure, pulse, and oxygen saturations were                            monitored continuously. The GIF-[id number] ([id number])                            scope was introduced through the mouth, and                            advanced to the second part of duodenum. The upper                            GI endoscopy was accomplished without difficulty.                            The patient tolerated the procedure well. Scope In: 11:26:20 AM Scope Out: 11:37:54 AM Total Procedure Duration: 0 hours 11 minutes 34 seconds  Findings:      The examined esophagus was normal.      Localized mild inflammation characterized by congestion (edema) and       erythema was found in the gastric antrum. Biopsies(2; antrum, 1:       incisura 2: body) were taken with a cold forceps for Helicobacter pylori       testing.      A single 6 mm sessile polyp with no bleeding was found in the duodenal       bulb. The polyp was removed with a cold snare. Resection and retrieval       were complete.      The second portion of the duodenum was normal. Impression:               - MILD Gastritis. Biopsied.                           - A single duodenal polyp. Resected and retrieved.                           -  ABDOMINAL PAIN MOST LIKELY DUE TO GASTRITIS &                            CONSTIPATION. Moderate Sedation:      Moderate (conscious) sedation was administered by the endoscopy nurse       and supervised by the endoscopist. The following parameters were       monitored: oxygen saturation, heart rate, blood pressure, and response       to care. Total physician intraservice time was 55 minutes. Recommendation:           - Patient has a contact number available for                            emergencies. The signs and symptoms of potential                             delayed complications were discussed with the                            patient. Return to normal activities tomorrow.                            Written discharge instructions were provided to the                            patient.                           - High fiber diet. ADD AMITIZA BID.                           - Continue present medications. PROTONIX DAILY.                           - Await pathology results.                           - Return to my office in 4 months. Procedure Code(s):        --- Professional ---                           [phone], Esophagogastroduodenoscopy, flexible,                            transoral; with removal of tumor(s), polyp(s), or                            other lesion(s) by snare technique                           28366, 42, Esophagogastroduodenoscopy, flexible,                            transoral; with biopsy, single or multiple  41740, Moderate sedation; each additional 15                            minutes intraservice time                           99153, Moderate sedation; each additional 15                            minutes intraservice time                           99153, Moderate sedation; each additional 15                            minutes intraservice time                           G0500, Moderate sedation services provided by the                            same physician or other qualified health care                            professional performing a gastrointestinal                            endoscopic service that sedation supports,                            requiring the presence of an independent trained                            observer to assist in the monitoring of the                            patient's level of consciousness and physiological                            status; initial 15 minutes of intra-service time;                            patient age 79 years or older  (additional time may                            be reported with [phone], as appropriate) Diagnosis Code(s):        --- Professional ---                           K29.70, Gastritis, unspecified, without bleeding                           K31.7, Polyp of stomach and duodenum                           R10.12, Left upper  quadrant pain                           K30, Functional dyspepsia CPT copyright 2019 [employer]. All rights reserved. The codes documented in this report are preliminary and upon coder review may  be revised to meet current compliance requirements. Barney Drain, MD Barney Drain MD, MD 10/20/2018 12:17:21 PM This report has been signed electronically. Number of Addenda: 0

## 2018-10-20 NOTE — Discharge Instructions (Signed)
You had 4 polyps removed. You have internal hemorrhoids & DIVERTICULOSIS IN YOUR LEFT COLON. You have Mild gastritis due to IBUPROFEN.  I REMOVED ONE POLYP FROM YOUR SMALL BOWEL. I biopsied your stomach.    DRINK WATER TO KEEP YOUR URINE LIGHT YELLOW.  FOLLOW A HIGH FIBER DIET. AVOID ITEMS THAT CAUSE BLOATING. SEE INFO BELOW.   REDUCE PROTONIX. TAKE 30 MINUTES PRIOR TO BREAKFAST EVERY DAY.  USE AMITIZA EVERY NIGHT FOR 7 DAYS THEN TWICE DAILY WITH FOOD. IT TREATS CONSTIPATION BUT MAY CAUSE NAUSEA.   YOUR BIOPSY RESULTS WILL BE BACK IN 5 BUSINESS DAYS.   FOLLOW UP IN 4 MOS.   Next colonoscopy in 3 years. YOUR SISTERS, BROTHERS, CHILDREN, AND PARENTS NEED TO HAVE A COLONOSCOPY STARTING AT THE AGE OF 40.      ENDOSCOPY Care After Read the instructions outlined below and refer to this sheet in the next week. These discharge instructions provide you with general information on caring for yourself after you leave the hospital. While your treatment has been planned according to the most current medical practices available, unavoidable complications occasionally occur. If you have any problems or questions after discharge, call DR. Warnell Rasnic, [phone].  ACTIVITY  You may resume your regular activity, but move at a slower pace for the next 24 hours.   Take frequent rest periods for the next 24 hours.   Walking will help get rid of the air and reduce the bloated feeling in your belly (abdomen).   No driving for 24 hours (because of the medicine (anesthesia) used during the test).   You may shower.   Do not sign any important legal documents or operate any machinery for 24 hours (because of the anesthesia used during the test).    NUTRITION  Drink plenty of fluids.   You may resume your normal diet as instructed by your doctor.   Begin with a light meal and progress to your normal diet. Heavy or fried foods are harder to digest and may make you feel sick to your stomach  (nauseated).   Avoid alcoholic beverages for 24 hours or as instructed.    MEDICATIONS  You may resume your normal medications.   WHAT YOU CAN EXPECT TODAY  Some feelings of bloating in the abdomen.   Passage of more gas than usual.   Spotting of blood in your stool or on the toilet paper  .  IF YOU HAD POLYPS REMOVED DURING THE ENDOSCOPY:  Eat a soft diet IF YOU HAVE NAUSEA, BLOATING, ABDOMINAL PAIN, OR VOMITING.    FINDING OUT THE RESULTS OF YOUR TEST Not all test results are available during your visit. DR. Oneida Alar WILL CALL YOU WITHIN 7 DAYS OF YOUR PROCEDUE WITH YOUR RESULTS. Do not assume everything is normal if you have not heard from DR. Jarah Pember IN ONE WEEK, CALL HER OFFICE AT [phone].  SEEK IMMEDIATE MEDICAL ATTENTION AND CALL THE OFFICE: [phone] IF:  You have more than a spotting of blood in your stool.   Your belly is swollen (abdominal distention).   You are nauseated or vomiting.   You have a temperature over 101F.   You have abdominal pain or discomfort that is severe or gets worse throughout the day.   Gastritis  Gastritis/DUODENITIS is inflammation (the body's way of reacting to injury and/or infection) of the stomach. It is often caused by viral or bacterial (germ) infections. It can also be caused BY ASPIRIN, BC/GOODY POWDER'S, (IBUPROFEN) MOTRIN, OR ALEVE (NAPROXEN), chemicals (including  alcohol), SPICY FOODS, and medications. This illness may be associated with generalized malaise (feeling tired, not well), UPPER ABDOMINAL STOMACH cramps, and fever. One common bacterial cause of gastritis is an organism known as H. Pylori. This can be treated with antibiotics.    High-Fiber Diet A high-fiber diet changes your normal diet to include more whole grains, legumes, fruits, and vegetables. Changes in the diet involve replacing refined carbohydrates with unrefined foods. The calorie level of the diet is essentially unchanged. The Dietary Reference  Intake (recommended amount) for adult males is 38 grams per day. For adult females, it is 25 grams per day. Pregnant and lactating women should consume 28 grams of fiber per day. Fiber is the intact part of a plant that is not broken down during digestion. Functional fiber is fiber that has been isolated from the plant to provide a beneficial effect in the body.  PURPOSE  Increase stool bulk.   Ease and regulate bowel movements.   Lower cholesterol.   REDUCE RISK OF COLON CANCER  INDICATIONS THAT YOU NEED MORE FIBER  Constipation and hemorrhoids.   Uncomplicated diverticulosis (intestine condition) and irritable bowel syndrome.   Weight management.   As a protective measure against hardening of the arteries (atherosclerosis), diabetes, and cancer.   GUIDELINES FOR INCREASING FIBER IN THE DIET  Start adding fiber to the diet slowly. A gradual increase of about 5 more grams (2 slices of whole-wheat bread, 2 servings of most fruits or vegetables, or 1 bowl of high-fiber cereal) per day is best. Too rapid an increase in fiber may result in constipation, flatulence, and bloating.   Drink enough water and fluids to keep your urine clear or pale yellow. Water, juice, or caffeine-free drinks are recommended. Not drinking enough fluid may cause constipation.   Eat a variety of high-fiber foods rather than one type of fiber.   Try to increase your intake of fiber through using high-fiber foods rather than fiber pills or supplements that contain small amounts of fiber.   The goal is to change the types of food eaten. Do not supplement your present diet with high-fiber foods, but replace foods in your present diet.   INCLUDE A VARIETY OF FIBER SOURCES  Replace refined and processed grains with whole grains, canned fruits with fresh fruits, and incorporate other fiber sources. White rice, white breads, and most bakery goods contain little or no fiber.   Brown whole-grain rice, buckwheat  oats, and many fruits and vegetables are all good sources of fiber. These include: broccoli, Brussels sprouts, cabbage, cauliflower, beets, sweet potatoes, white potatoes (skin on), carrots, tomatoes, eggplant, squash, berries, fresh fruits, and dried fruits.   Cereals appear to be the richest source of fiber. Cereal fiber is found in whole grains and bran. Bran is the fiber-rich outer coat of cereal grain, which is largely removed in refining. In whole-grain cereals, the bran remains. In breakfast cereals, the largest amount of fiber is found in those with "bran" in their names. The fiber content is sometimes indicated on the label.   You may need to include additional fruits and vegetables each day.   In baking, for 1 cup white flour, you may use the following substitutions:   1 cup whole-wheat flour minus 2 tablespoons.   1/2 cup white flour plus 1/2 cup whole-wheat flour.   Diverticulosis Diverticulosis is a common condition that develops when small pouches (diverticula) form in the wall of the colon. The risk of diverticulosis increases with age. It  happens more often in people who eat a low-fiber diet. Most individuals with diverticulosis have no symptoms. Those individuals with symptoms usually experience belly (abdominal) pain, constipation, or loose stools (diarrhea).  HOME CARE INSTRUCTIONS  Increase the amount of fiber in your diet as directed by your caregiver or dietician. This may reduce symptoms of diverticulosis.   Drink at least 6 to 8 glasses of water each day to prevent constipation.   Try not to strain when you have a bowel movement.   Avoiding nuts and seeds to prevent complications is NOT NECESSARY.    FOODS HAVING HIGH FIBER CONTENT INCLUDE:  Fruits. Apple, peach, pear, tangerine, raisins, prunes.   Vegetables. Brussels sprouts, asparagus, broccoli, cabbage, carrot, cauliflower, romaine lettuce, spinach, summer squash, tomato, winter squash, zucchini.   Starchy  Vegetables. Baked beans, kidney beans, lima beans, split peas, lentils, potatoes (with skin).   Grains. Whole wheat bread, brown rice, bran flake cereal, plain oatmeal, white rice, shredded wheat, bran muffins.   Polyps, Colon  A polyp is extra tissue that grows inside your body. Colon polyps grow in the large intestine. The large intestine, also called the colon, is part of your digestive system. It is a long, hollow tube at the end of your digestive tract where your body makes and stores stool. Most polyps are not dangerous. They are benign. This means they are not cancerous. But over time, some types of polyps can turn into cancer. Polyps that are smaller than a pea are usually not harmful. But larger polyps could someday become or may already be cancerous. To be safe, doctors remove all polyps and test them.   WHO GETS POLYPS? Anyone can get polyps, but certain people are more likely than others. You may have a greater chance of getting polyps if:  You are over 50.   You have had polyps before.   Someone in your family has had polyps.   Someone in your family has had cancer of the large intestine.   Find out if someone in your family has had polyps. You may also be more likely to get polyps if you:   Eat a lot of fatty foods   Smoke   Drink alcohol   Do not exercise  Eat too much   PREVENTION There is not one sure way to prevent polyps. You might be able to lower your risk of getting them if you:  Eat more fruits and vegetables and less fatty food.   Do not smoke.   Avoid alcohol.   Exercise every day.   Lose weight if you are overweight.   Eating more calcium and folate can also lower your risk of getting polyps. Some foods that are rich in calcium are milk, cheese, and broccoli. Some foods that are rich in folate are chickpeas, kidney beans, and spinach.

## 2018-10-20 NOTE — H&P (Signed)
Primary Care Physician:  Soyla Dryer, PA-C Primary Gastroenterologist:  Dr. Oneida Alar  Pre-Procedure History & Physical: HPI:  Casey Johnston is a 58 y.o. female here for DYSPEPSIA/personal history of polyps.  Past Medical History:  Diagnosis Date  . Anxiety and depression   . Diabetes mellitus, type II ([place])    Dr. Dorris Fetch managing  . Frequent headaches chronic   ibuprofen helps some  . History of UTI    Recurrent per pt (approx 3 per year)  . Hyperlipidemia   . Hypertension   . Leukocytosis 12/2015   mild lymphocytosis.  Path smear review reassuring.  Repeat 01/2016 stable.  . Morbid obesity ([place])   . OSA on CPAP     Past Surgical History:  Procedure Laterality Date  . ABDOMINAL HYSTERECTOMY  1998   Dysfunctional uterine bleeding.  No hx of abnormal paps.  . COLONOSCOPY  2012   Dr. Earley Brooke: 1.5 cm pendunculated descending colon polyp (tubulovillous adenoma) and diminutive hyperplastic polyps    Prior to Admission medications   Medication Sig Start Date End Date Taking? Authorizing Provider  insulin glargine (LANTUS) 100 UNIT/ML injection Inject 60 Units into the skin at bedtime.    Yes [provider]  lidocaine (XYLOCAINE) 2 % solution Use as directed 15 mLs in the mouth or throat every 6 (six) hours as needed for mouth pain. Patient taking differently: Use as directed 15 mLs in the mouth or throat every 6 (six) hours as needed. For stomach pain 09/18/18  Yes Annitta Needs, NP  metFORMIN (GLUCOPHAGE) 1000 MG tablet Take 1 tablet (1,000 mg total) by mouth 2 (two) times daily with a meal. 10/14/18  Yes Soyla Dryer, PA-C  amLODipine (NORVASC) 10 MG tablet Take 1 tablet (10 mg total) by mouth daily. 10/14/18   Soyla Dryer, PA-C  atorvastatin (LIPITOR) 20 MG tablet Take 1 tablet (20 mg total) by mouth daily. 10/14/18   Soyla Dryer, PA-C         lisinopril (PRINIVIL,ZESTRIL) 20 MG tablet Take 2 tablets (40 mg total) by mouth daily. 07/31/18   Soyla Dryer, PA-C  Omega-3 Fatty Acids (FISH OIL PO) Take 1 capsule by mouth daily.     [provider]  oxyCODONE-acetaminophen (PERCOCET/ROXICET) 5-325 MG tablet Take 1 tablet by mouth every 6 (six) hours as needed for severe pain. 10/08/18   Arrien, Jimmy Picket, MD  pantoprazole (PROTONIX) 40 MG tablet Take 40 mg by mouth 2 (two) times a day. 09/27/18   [provider]    Allergies as of 07/30/2018 - Review Complete 07/30/2018  Allergen Reaction Noted  . Januvia [sitagliptin] Other (See Comments) 05/21/2018    Family History  Problem Relation Age of Onset  . Cancer Mother        ovarian  . COPD Father   . Diabetes Father   . Colon cancer Neg Hx     Social History   Socioeconomic History  . Marital status: Widowed    Spouse name: Not on file  . Number of children: Not on file  . Years of education: Not on file  . Highest education level: Not on file  Occupational History  . Not on file  Social Needs  . Financial resource strain: Not on file  . Food insecurity:    Worry: Not on file    Inability: Not on file  . Transportation needs:    Medical: Not on file    Non-medical: Not on file  Tobacco Use  . Smoking  status: Current Every Day Smoker    Packs/day: 0.25    Years: 20.00    Pack years: 5.00    Types: Cigarettes  . Smokeless tobacco: Never Used  Substance and Sexual Activity  . Alcohol use: No  . Drug use: No  . Sexual activity: Not on file  Lifestyle  . Physical activity:    Days per week: Not on file    Minutes per session: Not on file  . Stress: Not on file  Relationships  . Social connections:    Talks on phone: Not on file    Gets together: Not on file    Attends religious service: Not on file    Active member of club or organization: Not on file    Attends meetings of clubs or organizations: Not on file    Relationship status: Not on file  . Intimate partner violence:    Fear of current or ex partner: Not on file    Emotionally  abused: Not on file    Physically abused: Not on file    Forced sexual activity: Not on file  Other Topics Concern  . Not on file  Social History Narrative   Separated x 20 yrs, one daughter.  She lives with her.  Great niece and nephew live with her as well.   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   Tob: 7 pack year hx, quit 2010.   Alc: none   Exercise: none    Review of Systems: See HPI, otherwise negative ROS   Physical Exam: BP (!) 154/83   Pulse (!) 5   Temp 98.4 F (36.9 C) (Oral)   Resp 14   Ht 5\' 5"  (1.651 m)   Wt 77.1 kg   SpO2 100%   BMI 28.29 kg/m  General:   Alert,  pleasant and cooperative in NAD Head:  Normocephalic and atraumatic. Neck:  Supple; Lungs:  Clear throughout to auscultation.    Heart:  Regular rate and rhythm. Abdomen:  Soft, nontender and nondistended. Normal bowel sounds, without guarding, and without rebound.   Neurologic:  Alert and  oriented x4;  grossly normal neurologically.  Impression/Plan:    DYSPEPSIA/personal history of polyps.  Plan: 1. TCS/EGD TODAY. DISCUSSED PROCEDURE, BENEFITS, & RISKS: < 1% chance of medication reaction, bleeding, perforation, ASPIRATION, or rupture of spleen/liver requiring surgery to fix it and missed polyps < 1 cm 10-20% of the time.

## 2018-10-20 NOTE — Op Note (Signed)
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 Estimated Blood Loss:     Estimated blood loss was minimal. Procedure:                Pre-Anesthesia Assessment:                           - Prior to the procedure, a History and Physical                            was performed, and patient medications and                            allergies were reviewed. The patient's tolerance of                            previous anesthesia was also reviewed. The risks                            and benefits of the procedure and the sedation                            options and risks were discussed with the patient.                            All questions were answered, and informed consent                            was obtained. Prior Anticoagulants: The patient has                            taken no previous anticoagulant or antiplatelet                            agents except for NSAID medication. ASA Grade  Assessment: II - A patient with mild systemic                            disease. After reviewing the risks and benefits,                            the patient was deemed in satisfactory condition to                            undergo the procedure. After obtaining informed   consent, the colonoscope was passed under direct                            vision. Throughout the procedure, the patient's                            blood pressure, pulse, and oxygen saturations were                            monitored continuously. The PCF-[id number] ([id number])                            scope was introduced through the anus and advanced                            to the the cecum, identified by appendiceal orifice                            and ileocecal valve. The colonoscopy was somewhat                            difficult due to a tortuous colon. Successful                            completion of the procedure was aided by                            straightening and shortening the scope to obtain                            bowel loop reduction and COLOWRAP. The patient                            tolerated the procedure well. The quality of the                            bowel preparation was excellent. The ileocecal                            valve, appendiceal orifice, and rectum were                            photographed. Scope In: 10:55:33 AM Scope Out: 11:17:10 AM Scope Withdrawal Time: 0 hours  18 minutes 36 seconds  Total Procedure Duration: 0 hours 21 minutes 37 seconds  Findings:      Four sessile polyps were found in the sigmoid colon, descending colon,       hepatic flexure and ascending colon. The polyps were 4 to 7 mm in size.       These polyps were removed with a cold snare. Resection and retrieval       were complete.      Multiple small and large-mouthed diverticula were found in the sigmoid       colon.      The recto-sigmoid colon and sigmoid colon were mildly tortuous.      External and internal hemorrhoids were found. The hemorrhoids were       moderate. Impression:               - Four 4 to 7 mm polyps in the sigmoid colon, in                            the descending colon, at the hepatic flexure and in                            the  ascending colon, removed with a cold snare.                            Resected and retrieved.                           - Diverticulosis in the sigmoid colon.                           - Tortuous LEFT colon.                           - External and internal hemorrhoids. Moderate Sedation:      Moderate (conscious) sedation was administered by the endoscopy nurse       and supervised by the endoscopist. The following parameters were       monitored: oxygen saturation, heart rate, blood pressure, and response       to care. Total physician intraservice time was 55 minutes. Recommendation:           - Patient has a contact number available for                            emergencies. The signs and symptoms of potential                            delayed complications were discussed with the                            patient. Return to normal activities tomorrow.                            Written discharge instructions were provided to the                            patient.                           -  High fiber diet.                           - Continue present medications.                           - Await pathology results.                           - Repeat colonoscopy in 3 years for surveillance.                            ALL FIRST DEGREE RELATIVES NEED TCS AT AGE 59.                           - Return to GI office in 4 months. Procedure Code(s):        --- Professional ---                           [phone], Colonoscopy, flexible; with removal of                            tumor(s), polyp(s), or other lesion(s) by snare                            technique                           99153, Moderate sedation; each additional 15                            minutes intraservice time                           99153, Moderate sedation; each additional 15                            minutes intraservice time                           99153, Moderate sedation; each additional 15                             minutes intraservice time                           G0500, Moderate sedation services provided by the                            same physician or other qualified health care                            professional performing a gastrointestinal                            endoscopic service that sedation supports,  requiring the presence of an independent trained                            observer to assist in the monitoring of the                            patient's level of consciousness and physiological                            status; initial 15 minutes of intra-service time;                            patient age 43 years or older (additional time may                            be reported with [phone], as appropriate) Diagnosis Code(s):        --- Professional ---                           K63.5, Polyp of colon                           K64.8, Other hemorrhoids                           Z86.010, Personal history of colonic polyps                           K57.30, Diverticulosis of large intestine without                            perforation or abscess without bleeding                           Q43.8, Other specified congenital malformations of                            intestine CPT copyright 2019 American Medical Association. All rights reserved. The codes documented in this report are preliminary and upon coder review may  be revised to meet current compliance requirements. Barney Drain, MD Barney Drain MD, MD 10/20/2018 12:05:52 PM This report has been signed electronically. Number of Addenda: 0

## 2018-10-21 ENCOUNTER — Telehealth: Payer: Self-pay

## 2018-10-21 NOTE — Telephone Encounter (Signed)
PT called for samples of Amitiza 24 mcg. She is out of work now and has no insurance and the Rx was going to be about $400.00.  I have left her #20 of the Amitiza 24 mcg at front to take per Dr. Oneida Alar instructions, one qhs x 7 days and then one bid with food. Pt will call when she gets to the office to pick them up.

## 2018-10-21 NOTE — Telephone Encounter (Signed)
REVIEWED-NO ADDITIONAL RECOMMENDATIONS. 

## 2018-10-23 ENCOUNTER — Encounter (HOSPITAL_COMMUNITY): Payer: Self-pay | Admitting: Gastroenterology

## 2018-10-24 NOTE — Progress Notes (Signed)
PT is aware.

## 2018-10-24 NOTE — Progress Notes (Signed)
CC'D TO PCP AND ON RECALL  °

## 2018-10-24 NOTE — Progress Notes (Signed)
ON RECALL  °

## 2018-10-30 ENCOUNTER — Other Ambulatory Visit: Payer: Self-pay | Admitting: Physician Assistant

## 2018-10-30 DIAGNOSIS — E785 Hyperlipidemia, unspecified: Secondary | ICD-10-CM

## 2018-10-30 DIAGNOSIS — I10 Essential (primary) hypertension: Secondary | ICD-10-CM

## 2018-10-30 DIAGNOSIS — E119 Type 2 diabetes mellitus without complications: Secondary | ICD-10-CM

## 2018-10-30 DIAGNOSIS — D649 Anemia, unspecified: Secondary | ICD-10-CM

## 2018-11-07 ENCOUNTER — Other Ambulatory Visit (HOSPITAL_COMMUNITY)
Admission: RE | Admit: 2018-11-07 | Discharge: 2018-11-07 | Disposition: A | Discharge status: Home / Self Care | Payer: Self-pay | Source: Ambulatory Visit | Attending: Physician Assistant | Admitting: Physician Assistant

## 2018-11-07 DIAGNOSIS — I10 Essential (primary) hypertension: Secondary | ICD-10-CM | POA: Insufficient documentation

## 2018-11-07 DIAGNOSIS — E785 Hyperlipidemia, unspecified: Secondary | ICD-10-CM | POA: Insufficient documentation

## 2018-11-07 DIAGNOSIS — D649 Anemia, unspecified: Secondary | ICD-10-CM | POA: Insufficient documentation

## 2018-11-07 DIAGNOSIS — E119 Type 2 diabetes mellitus without complications: Secondary | ICD-10-CM | POA: Insufficient documentation

## 2018-11-07 LAB — COMPREHENSIVE METABOLIC PANEL
ALT: 14 U/L (ref 0–44)
AST: 17 U/L (ref 15–41)
Albumin: 3.8 g/dL (ref 3.5–5.0)
Alkaline Phosphatase: 83 U/L (ref 38–126)
Anion gap: 10 (ref 5–15)
BUN: 11 mg/dL (ref 6–20)
CO2: 26 mmol/L (ref 22–32)
Calcium: 9.5 mg/dL (ref 8.9–10.3)
Chloride: 106 mmol/L (ref 98–111)
Creatinine, Ser: 0.54 mg/dL (ref 0.44–1.00)
GFR calc Af Amer: >60 mL/min (ref >60)
GFR calc non Af Amer: >60 mL/min (ref >60)
Glucose, Bld: 110 mg/dL — ABNORMAL HIGH (ref 70–99)
Potassium: 3.2 mmol/L — ABNORMAL LOW (ref 3.5–5.1)
Sodium: 142 mmol/L (ref 135–145)
Total Bilirubin: 0.5 mg/dL (ref 0.3–1.2)
Total Protein: 7.8 g/dL (ref 6.5–8.1)

## 2018-11-07 LAB — LIPID PANEL
Cholesterol: 139 mg/dL (ref 0–200)
HDL: 41 mg/dL (ref >40)
LDL Cholesterol: 64 mg/dL (ref 0–99)
Total CHOL/HDL Ratio: 3.4 RATIO
Triglycerides: 168 mg/dL — ABNORMAL HIGH (ref <150)
VLDL: 34 mg/dL (ref 0–40)

## 2018-11-07 LAB — HEMOGLOBIN A1C
Hgb A1c MFr Bld: 7.5 % — ABNORMAL HIGH (ref 4.8–5.6)
Mean Plasma Glucose: 168.55 mg/dL

## 2018-11-07 LAB — HEMOGLOBIN AND HEMATOCRIT, BLOOD
HCT: 37 % (ref 36.0–46.0)
Hemoglobin: 12.3 g/dL (ref 12.0–15.0)

## 2018-11-11 ENCOUNTER — Telehealth: Payer: Self-pay | Admitting: Gastroenterology

## 2018-11-11 ENCOUNTER — Encounter: Payer: Self-pay | Admitting: Gastroenterology

## 2018-11-11 NOTE — Telephone Encounter (Signed)
[phone] please call patient, she thinks the Dorise Bullion has stopped working because she has not had a bowel movement in 8 days.  She would like to try another medication.

## 2018-11-11 NOTE — Telephone Encounter (Signed)
Pt is aware.   She is aware of apt on 11/28/2018 at 11:00 Am with [name], PA.

## 2018-11-11 NOTE — Telephone Encounter (Signed)
She needs an office visit sooner than what is planned. Would like her to return in next 2-3 weeks, first available in that time range. Amitiza should be BID with food. If she goes more than 2 days without a BM, needs to take a dose of Miralax that evening. We need face to face to discuss other medications she has tried, as she was last seen just virtually. For today, can take Miralax 1 capful with 8 ounces of water each hour up to 6 doses. Make sure taking with plenty of water. I agree another medication needs to be tried if she is taking this BID without much relief.

## 2018-11-11 NOTE — Telephone Encounter (Signed)
PT has been taking the Amitiza bid with food and has not had a BM in over 8 days.  [name], please advise!

## 2018-11-12 ENCOUNTER — Encounter: Payer: Self-pay | Admitting: Physician Assistant

## 2018-11-12 ENCOUNTER — Ambulatory Visit: Payer: Self-pay | Admitting: Physician Assistant

## 2018-11-12 DIAGNOSIS — E669 Obesity, unspecified: Secondary | ICD-10-CM

## 2018-11-12 DIAGNOSIS — K5900 Constipation, unspecified: Secondary | ICD-10-CM

## 2018-11-12 DIAGNOSIS — E876 Hypokalemia: Secondary | ICD-10-CM

## 2018-11-12 DIAGNOSIS — E785 Hyperlipidemia, unspecified: Secondary | ICD-10-CM

## 2018-11-12 DIAGNOSIS — E119 Type 2 diabetes mellitus without complications: Secondary | ICD-10-CM

## 2018-11-12 DIAGNOSIS — I10 Essential (primary) hypertension: Secondary | ICD-10-CM

## 2018-11-12 DIAGNOSIS — F17200 Nicotine dependence, unspecified, uncomplicated: Secondary | ICD-10-CM

## 2018-11-12 MED ORDER — METOPROLOL TARTRATE 50 MG PO TABS: 50.0000 mg | ORAL_TABLET | Freq: Two times a day (BID) | ORAL | 1 refills | Status: DC

## 2018-11-12 NOTE — Progress Notes (Signed)
There were no vitals taken for this visit.   Subjective:    Patient ID: Casey Johnston, female    DOB: 1960/11/04, 58 y.o.   MRN: [id number]  HPI: Casey Johnston is a 58 y.o. female presenting on 11/12/2018 for No chief complaint on file.   HPI  This is a telemedicine visit through [employer] due to coronavirus pandemic  I connected with  [place] on 11/12/18 by a video enabled telemedicine application and verified that I am speaking with the correct person using two identifiers.   I discussed the limitations of evaluation and management by telemedicine. The patient expressed understanding and agreed to proceed.   Pt is at home.  Provider is at office   Pt was scheduled to come into office today but called shortly before appointment and had it changed to a telemedicine visit due to diarrhea.  She has follow up appointment with GI on 11/28/18.  Pt is feeling bloated and "not good".  Pt states no BM for 12 days.  Today she started moving the bowels again.   She says except for the GI issues, she is feeling well.  She was noted to have a cough.  Pt states cough x 2 weeks.  She Says it is a dry cough. She has had 2 negative CoVid-19 tests  BP on 10/20/18 - 154/83  (when she went into hospital for her endoscopy)  Current BP meds :  Amlodipine 10mg  qd and Lisinopril 40mg  qd    Relevant past medical, surgical, family and social history reviewed and updated as indicated. Interim medical history since our last visit reviewed. Allergies and medications reviewed and updated.   Current Outpatient Medications:  .  amLODipine (NORVASC) 10 MG tablet, Take 1 tablet (10 mg total) by mouth daily., Disp: 90 tablet, Rfl: 0 .  atorvastatin (LIPITOR) 20 MG tablet, Take 1 tablet (20 mg total) by mouth daily., Disp: 90 tablet, Rfl: 2 .  insulin glargine (LANTUS) 100 UNIT/ML injection, Inject 60 Units into the skin at bedtime. , Disp: , Rfl:  .  lidocaine (XYLOCAINE) 2 % solution, Use as  directed 15 mLs in the mouth or throat every 6 (six) hours as needed for mouth pain. (Patient taking differently: Use as directed 15 mLs in the mouth or throat every 6 (six) hours as needed. For stomach pain), Disp: 100 mL, Rfl: 3 .  lisinopril (PRINIVIL,ZESTRIL) 20 MG tablet, Take 2 tablets (40 mg total) by mouth daily., Disp: 180 tablet, Rfl: 1 .  lubiprostone (AMITIZA) 24 MCG capsule, 1 PO QHS FOR 7 DAYS THEN 1 PO BID. TAKE WITH FOOD., Disp: 62 capsule, Rfl: 11 .  metFORMIN (GLUCOPHAGE) 1000 MG tablet, Take 1 tablet (1,000 mg total) by mouth 2 (two) times daily with a meal., Disp: 90 tablet, Rfl: 1 .  Omega-3 Fatty Acids (FISH OIL PO), Take 1 capsule by mouth daily. , Disp: , Rfl:  .  pantoprazole (PROTONIX) 40 MG tablet, 1 po 30 mins prior to first meal, Disp: 30 tablet, Rfl: 11    Review of Systems  Per HPI unless specifically indicated above     Objective:    There were no vitals taken for this visit.  Wt Readings from Last 3 Encounters:  10/20/18 170 lb (77.1 kg)  10/06/18 170 lb (77.1 kg)  07/31/18 172 lb 8 oz (78.2 kg)    Physical Exam Constitutional:      General: She is not in acute distress.    Appearance:  Normal appearance. She is obese. She is not ill-appearing.  HENT:     Head: Normocephalic and atraumatic.  Pulmonary:     Effort: Pulmonary effort is normal. No respiratory distress.  Neurological:     Mental Status: She is alert and oriented to person, place, and time.  Psychiatric:        Attention and Perception: Attention normal.        Speech: Speech normal.        Behavior: Behavior normal. Behavior is cooperative.        Cognition and Memory: Cognition normal.     Results for orders placed or performed during the hospital encounter of 11/07/18  Lipid panel  Result Value Ref Range   Cholesterol 139 0 - 200 mg/dL   Triglycerides 168 (H) <150 mg/dL   HDL 41 >40 mg/dL   Total CHOL/HDL Ratio 3.4 RATIO   VLDL 34 0 - 40 mg/dL   LDL Cholesterol 64 0 - 99  mg/dL  Comprehensive metabolic panel  Result Value Ref Range   Sodium 142 135 - 145 mmol/L   Potassium 3.2 (L) 3.5 - 5.1 mmol/L   Chloride 106 98 - 111 mmol/L   CO2 26 22 - 32 mmol/L   Glucose, Bld 110 (H) 70 - 99 mg/dL   BUN 11 6 - 20 mg/dL   Creatinine, Ser 0.54 0.44 - 1.00 mg/dL   Calcium 9.5 8.9 - 10.3 mg/dL   Total Protein 7.8 6.5 - 8.1 g/dL   Albumin 3.8 3.5 - 5.0 g/dL   AST 17 15 - 41 U/L   ALT 14 0 - 44 U/L   Alkaline Phosphatase 83 38 - 126 U/L   Total Bilirubin 0.5 0.3 - 1.2 mg/dL   GFR calc non Af Amer >60 >60 mL/min   GFR calc Af Amer >60 >60 mL/min   Anion gap 10 5 - 15  Hemoglobin A1c  Result Value Ref Range   Hgb A1c MFr Bld 7.5 (H) 4.8 - 5.6 %   Mean Plasma Glucose 168.55 mg/dL  Hemoglobin and hematocrit, blood  Result Value Ref Range   Hemoglobin 12.3 12.0 - 15.0 g/dL   HCT 37.0 36.0 - 46.0 %      Assessment & Plan:    Encounter Diagnoses  Name Primary?  . Diabetes mellitus without complication ([place]) Yes  . Essential hypertension, benign   . Hyperlipidemia, unspecified hyperlipidemia type   . Tobacco use disorder   . Constipation, unspecified constipation type   . Obesity, unspecified classification, unspecified obesity type, unspecified whether serious comorbidity present   . Hypokalemia       -reviewed labs with pt -Add metoprolol 50mg  bid -will Recheck K+ 1 month (will call pt to get labs) -pt encouraged to continue watching diabetic diet.  Her a1c has improved a great deal -encouraged smoking cessation -no other changes today -pt to keep follow up with GI -pt to follow up  5 week to recheck bp.  She is to contact office sooner prn

## 2018-11-28 ENCOUNTER — Ambulatory Visit: Payer: Self-pay | Admitting: Gastroenterology

## 2018-12-08 ENCOUNTER — Telehealth: Payer: Self-pay

## 2018-12-08 NOTE — Telephone Encounter (Signed)
I called and left VM for a return call to discuss meds.

## 2018-12-09 NOTE — Telephone Encounter (Signed)
Pt returned call. At one time the amitiza was not working for her and we were holding off on sending patient assistance. Then I was going to discuss with her at office visit and she cancelled appointment. She said the Amitiza is helping some and she wants to proceed with the patient assistance.  I will have it signed to fax.

## 2018-12-10 NOTE — Telephone Encounter (Signed)
Pt has been approved and will starting getting shipment soon.  She is aware this is good through 11/2019.

## 2018-12-10 NOTE — Telephone Encounter (Signed)
Paperwork has been faxed to [employer].

## 2018-12-12 ENCOUNTER — Other Ambulatory Visit (HOSPITAL_COMMUNITY)
Admission: RE | Admit: 2018-12-12 | Discharge: 2018-12-12 | Disposition: A | Discharge status: Home / Self Care | Payer: Self-pay | Source: Ambulatory Visit | Attending: Physician Assistant | Admitting: Physician Assistant

## 2018-12-12 DIAGNOSIS — I10 Essential (primary) hypertension: Secondary | ICD-10-CM | POA: Insufficient documentation

## 2018-12-12 DIAGNOSIS — E876 Hypokalemia: Secondary | ICD-10-CM | POA: Insufficient documentation

## 2018-12-12 DIAGNOSIS — E119 Type 2 diabetes mellitus without complications: Secondary | ICD-10-CM | POA: Insufficient documentation

## 2018-12-12 DIAGNOSIS — E785 Hyperlipidemia, unspecified: Secondary | ICD-10-CM | POA: Insufficient documentation

## 2018-12-12 LAB — BASIC METABOLIC PANEL
Anion gap: 10 (ref 5–15)
BUN: 11 mg/dL (ref 6–20)
CO2: 25 mmol/L (ref 22–32)
Calcium: 9.5 mg/dL (ref 8.9–10.3)
Chloride: 106 mmol/L (ref 98–111)
Creatinine, Ser: 0.59 mg/dL (ref 0.44–1.00)
GFR calc Af Amer: >60 mL/min (ref >60)
GFR calc non Af Amer: >60 mL/min (ref >60)
Glucose, Bld: 207 mg/dL — ABNORMAL HIGH (ref 70–99)
Potassium: 3.6 mmol/L (ref 3.5–5.1)
Sodium: 141 mmol/L (ref 135–145)

## 2018-12-16 ENCOUNTER — Ambulatory Visit: Payer: Self-pay | Admitting: Physician Assistant

## 2018-12-17 NOTE — Telephone Encounter (Signed)
Received a prescription request from from [facility] asking for a signature by [name], NP to complete the order.  [name] has signed and I have faxed it back.

## 2018-12-18 ENCOUNTER — Other Ambulatory Visit: Payer: Self-pay | Admitting: Physician Assistant

## 2018-12-18 ENCOUNTER — Ambulatory Visit: Payer: HRSA Program | Admitting: Physician Assistant

## 2018-12-18 ENCOUNTER — Encounter: Payer: Self-pay | Admitting: Physician Assistant

## 2018-12-18 VITALS — BP 142/64 | HR 69 | Temp 98.2°F | Wt 181.5 lb

## 2018-12-18 DIAGNOSIS — I10 Essential (primary) hypertension: Secondary | ICD-10-CM

## 2018-12-18 DIAGNOSIS — F418 Other specified anxiety disorders: Secondary | ICD-10-CM

## 2018-12-18 DIAGNOSIS — E119 Type 2 diabetes mellitus without complications: Secondary | ICD-10-CM

## 2018-12-18 DIAGNOSIS — E785 Hyperlipidemia, unspecified: Secondary | ICD-10-CM

## 2018-12-18 DIAGNOSIS — F17200 Nicotine dependence, unspecified, uncomplicated: Secondary | ICD-10-CM

## 2018-12-18 DIAGNOSIS — E669 Obesity, unspecified: Secondary | ICD-10-CM

## 2018-12-18 MED ORDER — METOPROLOL TARTRATE 100 MG PO TABS: 100.0000 mg | ORAL_TABLET | Freq: Two times a day (BID) | ORAL | 0 refills | Status: DC

## 2018-12-18 NOTE — Patient Instructions (Addendum)
[facility] [address] Linna Hoff [facility] [id number] [phone]  Monday March 09, 2019 10 am

## 2018-12-18 NOTE — Progress Notes (Signed)
BP (!) 142/64   Pulse 69   Temp 98.2 F (36.8 C)   Wt 181 lb 8 oz (82.3 kg)   SpO2 98%   BMI 30.20 kg/m    Subjective:    Patient ID: Casey Johnston, female    DOB: 1961-03-17, 59 y.o.   MRN: [id number]  HPI: Casey Johnston is a 58 y.o. female presenting on 12/18/2018 for No chief complaint on file.   HPI  Pt had a negative screening questionnaire for covid 19   She isn't checking her bs at home  Her GI symptoms have improved.    She has follow up with GI in September  She is not taking walks or working the yard or exercising in any way.  She says she is having some anxiety and depression.  She watches a lot of news.  She lives with her adult daughter who is working part-time in an urgent care.  Pt denies SI, HI.  She is not keeping in contact with friends and family by phone.  She says it's just such an effort to call.     Relevant past medical, surgical, family and social history reviewed and updated as indicated. Interim medical history since our last visit reviewed. Allergies and medications reviewed and updated.   Current Outpatient Medications:  .  amLODipine (NORVASC) 10 MG tablet, Take 1 tablet (10 mg total) by mouth daily., Disp: 90 tablet, Rfl: 0 .  atorvastatin (LIPITOR) 20 MG tablet, Take 1 tablet (20 mg total) by mouth daily., Disp: 90 tablet, Rfl: 2 .  insulin glargine (LANTUS) 100 UNIT/ML injection, Inject 60 Units into the skin at bedtime. , Disp: , Rfl:  .  lisinopril (PRINIVIL,ZESTRIL) 20 MG tablet, Take 2 tablets (40 mg total) by mouth daily., Disp: 180 tablet, Rfl: 1 .  metFORMIN (GLUCOPHAGE) 1000 MG tablet, Take 1 tablet (1,000 mg total) by mouth 2 (two) times daily with a meal., Disp: 90 tablet, Rfl: 1 .  metoprolol tartrate (LOPRESSOR) 50 MG tablet, Take 1 tablet (50 mg total) by mouth 2 (two) times daily., Disp: 180 tablet, Rfl: 1 .  Omega-3 Fatty Acids (FISH OIL PO), Take 1 capsule by mouth daily. , Disp: , Rfl:  .  pantoprazole (PROTONIX) 40  MG tablet, 1 po 30 mins prior to first meal, Disp: 30 tablet, Rfl: 11 .  lubiprostone (AMITIZA) 24 MCG capsule, 1 PO QHS FOR 7 DAYS THEN 1 PO BID. TAKE WITH FOOD. (Patient not taking: Reported on 12/18/2018), Disp: 62 capsule, Rfl: 11   Review of Systems  Per HPI unless specifically indicated above     Objective:    BP (!) 142/64   Pulse 69   Temp 98.2 F (36.8 C)   Wt 181 lb 8 oz (82.3 kg)   SpO2 98%   BMI 30.20 kg/m   Wt Readings from Last 3 Encounters:  12/18/18 181 lb 8 oz (82.3 kg)  10/20/18 170 lb (77.1 kg)  10/06/18 170 lb (77.1 kg)    Physical Exam Vitals signs reviewed.  Constitutional:      General: She is not in acute distress.    Appearance: She is well-developed. She is obese. She is not ill-appearing.  HENT:     Head: Normocephalic and atraumatic.  Neck:     Musculoskeletal: Neck supple.  Cardiovascular:     Rate and Rhythm: Normal rate and regular rhythm.  Pulmonary:     Effort: Pulmonary effort is normal.     Breath sounds:  Normal breath sounds.  Abdominal:     General: Bowel sounds are normal.     Palpations: Abdomen is soft. There is no mass.     Tenderness: There is no abdominal tenderness.  Musculoskeletal:     Right lower leg: No edema.     Left lower leg: No edema.  Lymphadenopathy:     Cervical: No cervical adenopathy.  Skin:    General: Skin is warm and dry.  Neurological:     Mental Status: She is alert and oriented to person, place, and time.  Psychiatric:        Attention and Perception: Attention normal.        Speech: Speech normal.        Behavior: Behavior normal. Behavior is cooperative.     Results for orders placed or performed during the hospital encounter of 95/09/32  Basic metabolic panel  Result Value Ref Range   Sodium 141 135 - 145 mmol/L   Potassium 3.6 3.5 - 5.1 mmol/L   Chloride 106 98 - 111 mmol/L   CO2 25 22 - 32 mmol/L   Glucose, Bld 207 (H) 70 - 99 mg/dL   BUN 11 6 - 20 mg/dL   Creatinine, Ser 0.59 0.44 -  1.00 mg/dL   Calcium 9.5 8.9 - 10.3 mg/dL   GFR calc non Af Amer >60 >60 mL/min   GFR calc Af Amer >60 >60 mL/min   Anion gap 10 5 - 15      Assessment & Plan:    Encounter Diagnoses  Name Primary?  . Essential hypertension, benign Yes  . Diabetes mellitus without complication ([place])   . Hyperlipidemia, unspecified hyperlipidemia type   . Obesity, unspecified classification, unspecified obesity type, unspecified whether serious comorbidity present   . Tobacco use disorder   . Depression with anxiety      -reviewed labs with pt.  K+ normal now  -will Increase metoprolol for bp  -Counseled on mental health.  Encouraged pt to exercise daily and be active in her yard/house.  Encouraged pt to call a friend or family member to catch up and talk.  Recommended pt avoid watching too much news.  Recommended she get no more than 15-30 minutes daily (TV and computer) as too much focusing on the news can worsen anxiety and depression.     -diabetic eye exam in October as scheduled  -pt encouraged to monitor bs  -gave pt flier on free lung cancer screening  -pt to follow up in 2 months.  She is to contact office sooner prn

## 2018-12-29 ENCOUNTER — Telehealth: Payer: Self-pay | Admitting: Gastroenterology

## 2018-12-29 NOTE — Telephone Encounter (Signed)
Pt called asking to speak with DS about her medications. I told her DS was with another patient and I would put a phone note in for her to call. [phone]

## 2018-12-30 NOTE — Telephone Encounter (Signed)
Pt was asking for samples of Amitiza 24 mcg and we do not have any.  We are waiting to hear from Patient Assistance and I will try to call and check on that this afternoon.

## 2018-12-30 NOTE — Telephone Encounter (Signed)
I called to check on status and it was actually approved on 12/10/2018 ( the day I faxed the application).  I spoke to [facility] who was very helpful in checking everything out.  He said it was processed to be shipped, but they couldn't find that it was shipped.  It will be expedited and and should be delivered to pt within 2-3 days.  Pt is aware and she will call if any more problems.

## 2019-01-05 ENCOUNTER — Telehealth: Payer: Self-pay

## 2019-01-05 NOTE — Telephone Encounter (Signed)
Pt called to let DS know that her Amitiza medication hasn't arrived yet. Pt can be contacted at [phone]

## 2019-01-06 NOTE — Telephone Encounter (Signed)
I called [facility] and spoke to [place] and Wells Guiles in the pharmacy.  They apologized and said the order should go out on Thursday this week and they will try to have it expedited to arrive early next week. It will be delivered to the patient.   Pt is aware and will call me if any more problems.

## 2019-01-08 NOTE — Telephone Encounter (Signed)
Pt called and has not received her shipment of Amitiza.24 mcg.  She is going to call Car[facility] Clamp and she will let me know what she finds out.

## 2019-01-19 NOTE — Progress Notes (Signed)
Referring Provider: Soyla Dryer, PA-C Primary Care Physician:  Soyla Dryer, PA-C Primary GI Physician: Dr. Oneida Alar  Chief Complaint  Patient presents with  . Constipation    BM once every 6 days, straining, no blood in stool    HPI:   [place] is a 58 y.o. female presenting today for follow-up of constipation. 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 Patient was last seen via virtual visit on 09/12/2018 for chronic left-sided upper, middle, and lower abdominal pain and constipation.  Abdominal pain has been present since late 2019 after starting Januvia but this had been discontinued and abdominal pain persisted.  Also noting early satiety and nausea without typical GERD symptoms.  Had documented weight loss of about 16 lbs between November 2019 and March 2020.  Prior treatments for constipation including Linzess 72 mcg every other day which was too strong.  Prior CT in October 2019 without acute findings.  H. pylori breath test negative in February 2020.  Plan to proceed with surveillance colonoscopy/diagnostic EGD in June. Amitiza 24 mcg samples provided.  Advised to resume Protonix 40 mg daily.  Colonoscopy on 10/20/2018: 4 polyps, diverticulosis in the sigmoid colon, tortuous left colon, external and internal hemorrhoids.  Pathology with tubular adenomas.  Recommended repeat colonoscopy in 3 years for surveillance. First-degree relatives need colonoscopy at age 70. EGD on 10/20/2018: Mild gastritis status post biopsy, single duodenal polyp status post resection.  No H. pylori on stomach biopsy.  Duodenal biopsy with fragments of polypoid duodenal mucosa with extensive surface gastric foveolar metaplasia, suggestive of nodular peptic duodenitis.  Also with likely contaminant of colonic mucosa. Suspected abdominal pain most likely due to gastritis and constipation.  Recommended to continue Protonix daily.   Several telephone messages back and forth between patient and  office staff regarding bowel habits and constipation medication management.  Patient assistance for Amitiza 24 mcg has been submitted and patient approved on 12/10/2018.  Latest telephone note on 01/08/19 with patient still not receiving shipment of Amitiza.  Today she states she is taking Amitiza as prescribed but is still having constipation. Just recently got back on Amitiza 2 weeks ago. Worked Friday August 21st, then no BM until the following Thursday and Friday. Small BM Saturday. No BM since. She stopped taking MiraLAX once she started Amitiza again.  On MiraLAX she was having a BM about every 3 days.  Denies blood in the stool or melena. Some intermittent left upper quadrant abdominal pain. States this has improved since we saw her last.  Abdominal pain improves on the days she is able to have a BM.  Denies postprandial pain.    She is trying to eat foods with more fiber and drinking a lot of water.   No nausea or vomiting. No GERD symptoms. No dysphagia. Reports cough since July. No unintentional weight loss recently.  Admits to some weight loss prior to last visit but her symptoms of nausea and early satiety as well as abdominal pain have improved.  Weight has been stable.  Appetite improved  Denies fever, chills, lightheadedness, dizziness, feeling like she will pass out, chest pain, palpitations, shortness of breath.      Past Medical History:  Diagnosis Date  . Anxiety and depression   . Diabetes mellitus, type II ([place])    Dr. Dorris Fetch managing  . Frequent headaches chronic   ibuprofen helps some  . History of UTI    Recurrent per pt (approx 3 per year)  .  Hyperlipidemia   . Hypertension   . Leukocytosis 12/2015   mild lymphocytosis.  Path smear review reassuring.  Repeat 01/2016 stable.  . Morbid obesity ([place])   . OSA on CPAP     Past Surgical History:  Procedure Laterality Date  . ABDOMINAL HYSTERECTOMY  1998   Dysfunctional uterine bleeding.  No hx of abnormal paps.  Marland Kitchen BIOPSY   10/20/2018   Procedure: BIOPSY;  Surgeon: Danie Binder, MD;  [facility];  Service: Endoscopy;;  stomach  . COLONOSCOPY  2012   Dr. Earley Brooke: 1.5 cm pendunculated descending colon polyp (tubulovillous adenoma) and diminutive hyperplastic polyps  . COLONOSCOPY N/A 10/20/2018   Procedure: COLONOSCOPY;  Surgeon: Danie Binder, MD;  [facility];  Service: Endoscopy;  Laterality: N/A;  10:30am  . ESOPHAGOGASTRODUODENOSCOPY N/A 10/20/2018   Procedure: ESOPHAGOGASTRODUODENOSCOPY (EGD);  Surgeon: Danie Binder, MD;  [facility];  Service: Endoscopy;  Laterality: N/A;  . POLYPECTOMY  10/20/2018   Procedure: POLYPECTOMY;  Surgeon: Danie Binder, MD;  [facility];  Service: Endoscopy;;    Current Outpatient Medications  Medication Sig Dispense Refill  . amLODipine (NORVASC) 10 MG tablet TAKE 1 Tablet BY MOUTH ONCE DAILY 90 tablet 0  . atorvastatin (LIPITOR) 20 MG tablet Take 1 tablet (20 mg total) by mouth daily. 90 tablet 2  . insulin glargine (LANTUS) 100 UNIT/ML injection Inject 60 Units into the skin at bedtime.     Marland Kitchen lisinopril (PRINIVIL,ZESTRIL) 20 MG tablet Take 2 tablets (40 mg total) by mouth daily. 180 tablet 1  . lubiprostone (AMITIZA) 24 MCG capsule 1 PO QHS FOR 7 DAYS THEN 1 PO BID. TAKE WITH FOOD. (Patient taking differently: Take 24 mcg by mouth 2 (two) times daily. 1 PO QHS FOR 7 DAYS THEN 1 PO BID. TAKE WITH FOOD.) 62 capsule 11  . metFORMIN (GLUCOPHAGE) 1000 MG tablet TAKE 1 Tablet  BY MOUTH TWICE DAILY WITH A MEAL 90 tablet 1  . metoprolol tartrate (LOPRESSOR) 100 MG tablet Take 1 tablet (100 mg total) by mouth 2 (two) times daily. (Patient taking differently: Take 50 mg by mouth 2 (two) times daily. ) 180 tablet 0  . Omega-3 Fatty Acids (FISH OIL PO) Take 1 capsule by mouth daily.     . pantoprazole (PROTONIX) 40 MG tablet 1 po 30 mins prior to first meal 30 tablet 11   No current facility-administered medications for this visit.      Allergies as of 01/21/2019 - Review Complete 01/21/2019  Allergen Reaction Noted  . Januvia [sitagliptin] Other (See Comments) 05/21/2018    Family History  Problem Relation Age of Onset  . Cancer Mother        ovarian  . COPD Father   . Diabetes Father   . Colon cancer Neg Hx     Social History   Socioeconomic History  . Marital status: Widowed    Spouse name: Not on file  . Number of children: Not on file  . Years of education: Not on file  . Highest education level: Not on file  Occupational History  . Not on file  Social Needs  . Financial resource strain: Not on file  . Food insecurity    Worry: Not on file    Inability: Not on file  . Transportation needs    Medical: Not on file    Non-medical: Not on file  Tobacco Use  . Smoking status: Current Every Day Smoker  Packs/day: 0.25    Years: 20.00    Pack years: 5.00    Types: Cigarettes  . Smokeless tobacco: Never Used  Substance and Sexual Activity  . Alcohol use: No  . Drug use: No  . Sexual activity: Not on file  Lifestyle  . Physical activity    Days per week: Not on file    Minutes per session: Not on file  . Stress: Not on file  Relationships  . Social Herbalist on phone: Not on file    Gets together: Not on file    Attends religious service: Not on file    Active member of club or organization: Not on file    Attends meetings of clubs or organizations: Not on file    Relationship status: Not on file  Other Topics Concern  . Not on file  Social History Narrative   Separated x 20 yrs, one daughter.  She lives with her.  Great niece and nephew live with her as well.   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   Tob: 7 pack year hx, quit 2010.   Alc: none   Exercise: none    Review of Systems: Gen: See HPI CV: Denies peripheral edema Resp: See HPI GI: See HPI. Derm: Denies rash Psych: Some anxiety and depression at this time.  Heme: Denies bruising, bleeding  Physical Exam:  BP (!) 145/75   Pulse 76   Temp (!) 97.2 F (36.2 C) (Oral)   Ht 5\' 5"  (1.651 m)   Wt 181 lb 3.2 oz (82.2 kg)   BMI 30.15 kg/m  General:   Alert and oriented. No distress noted. Pleasant and cooperative.  Head:  Normocephalic and atraumatic. Eyes:  Conjuctiva clear without scleral icterus. Heart:  S1, S2 present without murmurs appreciated. Lungs:  Clear to auscultation bilaterally. No wheezes, rales, or rhonchi. No distress.  Abdomen:  +BS, soft, non-tender and non-distended. No rebound or guarding. No HSM or masses noted. Msk:  Symmetrical without gross deformities. Normal posture. Extremities:  Without edema. Neurologic:  Alert and  oriented x4 Psych:  Normal mood and affect.

## 2019-01-21 ENCOUNTER — Encounter: Payer: Self-pay | Admitting: Gastroenterology

## 2019-01-21 ENCOUNTER — Other Ambulatory Visit: Payer: Self-pay

## 2019-01-21 ENCOUNTER — Ambulatory Visit (INDEPENDENT_AMBULATORY_CARE_PROVIDER_SITE_OTHER): Payer: Self-pay | Admitting: Gastroenterology

## 2019-01-21 VITALS — BP 145/75 | HR 76 | Temp 97.2°F | Ht 65.0 in | Wt 181.2 lb

## 2019-01-21 DIAGNOSIS — R109 Unspecified abdominal pain: Secondary | ICD-10-CM

## 2019-01-21 DIAGNOSIS — K5900 Constipation, unspecified: Secondary | ICD-10-CM

## 2019-01-21 NOTE — Patient Instructions (Addendum)
Please continue taking Amitiza 24 mcg twice daily with a meal.   Add MiraLAX 1 capful daily with 8 oz of water. You can decrease frequency if you start to have diarrhea.   Add daily fiber supplement. Benefiber or Metamucil are good choices. Start with the low dose and increase slowly.   Continue to drink plenty of water to keep your urine pale yellow to clear.   Please call in 2 weeks with a progress report.   We will plan to see you back in 3 months.   [name], PA-C [facility]   Constipation, Adult Constipation is when a person:  Poops (has a bowel movement) fewer times in a week than normal.  Has a hard time pooping.  Has poop that is dry, hard, or bigger than normal. Follow these instructions at home: Eating and drinking   Eat foods that have a lot of fiber, such as: ? Fresh fruits and vegetables. ? Whole grains. ? Beans.  Eat less of foods that are high in fat, low in fiber, or overly processed, such as: ? [place] fries. ? Hamburgers. ? Cookies. ? Candy. ? Soda.  Drink enough fluid to keep your pee (urine) clear or pale yellow. General instructions  Exercise regularly or as told by your doctor.  Go to the restroom when you feel like you need to poop. Do not hold it in.  Take over-the-counter and prescription medicines only as told by your doctor. These include any fiber supplements.  Do pelvic floor retraining exercises, such as: ? Doing deep breathing while relaxing your lower belly (abdomen). ? Relaxing your pelvic floor while pooping.  Watch your condition for any changes.  Keep all follow-up visits as told by your doctor. This is important. Contact a doctor if:  You have pain that gets worse.  You have a fever.  You have not pooped for 4 days.  You throw up (vomit).  You are not hungry.  You lose weight.  You are bleeding from the anus.  You have thin, pencil-like poop (stool). Get help right away if:  You have  a fever, and your symptoms suddenly get worse.  You leak poop or have blood in your poop.  Your belly feels hard or bigger than normal (is bloated).  You have very bad belly pain.  You feel dizzy or you faint. This information is not intended to replace advice given to you by your health care provider. Make sure you discuss any questions you have with your health care provider. Document Released: 10/24/2007 Document Revised: 04/19/2017 Document Reviewed: 10/26/2015 Elsevier Patient Education  2020 [employer].

## 2019-01-21 NOTE — Assessment & Plan Note (Addendum)
Left sided abdominal pain has improved.  Pain is now only in the left upper quadrant in the setting of constipation.   Improves after good BM.  No lower abdominal pain.  Nausea and early satiety have resolved.  No GERD symptoms at this time.  No alarm symptoms.  EGD in June 2020 with mild gastritis.  Biopsies without evidence of H. Pylori.  Colonoscopy up-to-date in June 2020 with 4 tubular adenomas, diverticulosis, tortuous left colon.  Due for repeat in 2023.  I suspect residual intermittent left upper quadrant abdominal pain will continue to improve as constipation is better managed (addressed above). Continue Protonix 40 mg daily.

## 2019-01-21 NOTE — Assessment & Plan Note (Addendum)
58 year old female with history of chronic constipation.  Associated with some intermittent left upper quadrant pain that is improves with bowel movements.  No alarm symptoms at this time.  Benign abdominal exam today.  Colonoscopy up-to-date on 10/20/2018 with 4 tubular adenomas, diverticulosis, tortuous left colon, external and internal hemorrhoids.  Due for repeat in 3 years.  She has been on Amitiza 24 mcg twice daily for the last 2 weeks for which patient is receiving through patient assistance.  Producing a bowel movement about every 6 days.  Prior to starting Amitiza she had been on MiraLAX which produced a bowel movement about every 3 days.  Historically Linzess has caused diarrhea.  Would like to try patient on Linzess again as Amitiza is not working optimally but patient does not have insurance and we do not have samples at this time.  Will add MiraLAX 1 capful daily with 8 ounces of water as well as a daily a fiber supplement.  Advised she drink plenty of water to keep urine pale yellow to clear.  Patient will call in 2 weeks with a progress report.  If no significant improvement with MiraLAX will provide patient with Linzess samples once we receive them.  Follow-up in 3 months.

## 2019-02-05 ENCOUNTER — Telehealth: Payer: Self-pay | Admitting: Student

## 2019-02-05 NOTE — Telephone Encounter (Signed)
Pt called stating she has a boil in the exact same spot as she did May 2020 (pt had perineal abscess and was referred to [facility] ER by PA where she had to be admitted.) pt states the boil is about half the size it was when she was examined by PA in May. Pt states she felt strange on Sunday, 02-01-19, on Monday and Tuesday it felt sore, and on Wednesday she could feel a bump. Pt states she has been applying a hot bath cloth on the area. Pt states there is no drainage at this time.   PA advises pt to go to back to [facility] ER for this. LPN notified pt and pt verbalizes understanding.

## 2019-02-06 ENCOUNTER — Emergency Department (HOSPITAL_COMMUNITY)
Admission: EM | Admit: 2019-02-06 | Discharge: 2019-02-06 | Disposition: A | Discharge status: Home / Self Care | Payer: Self-pay | Attending: Emergency Medicine | Admitting: Emergency Medicine

## 2019-02-06 ENCOUNTER — Encounter (HOSPITAL_COMMUNITY): Payer: Self-pay

## 2019-02-06 ENCOUNTER — Other Ambulatory Visit: Payer: Self-pay

## 2019-02-06 DIAGNOSIS — F17210 Nicotine dependence, cigarettes, uncomplicated: Secondary | ICD-10-CM | POA: Insufficient documentation

## 2019-02-06 DIAGNOSIS — K611 Rectal abscess: Secondary | ICD-10-CM | POA: Insufficient documentation

## 2019-02-06 DIAGNOSIS — I10 Essential (primary) hypertension: Secondary | ICD-10-CM | POA: Insufficient documentation

## 2019-02-06 DIAGNOSIS — L0231 Cutaneous abscess of buttock: Secondary | ICD-10-CM

## 2019-02-06 DIAGNOSIS — E119 Type 2 diabetes mellitus without complications: Secondary | ICD-10-CM | POA: Insufficient documentation

## 2019-02-06 MED ORDER — LIDOCAINE HCL (PF) 1 % IJ SOLN
10.0000 mL | Freq: Once | INTRAMUSCULAR | Status: AC
  Administered 2019-02-06: 10 mL
  Filled 2019-02-06: qty 30

## 2019-02-06 MED ORDER — SULFAMETHOXAZOLE-TRIMETHOPRIM 800-160 MG PO TABS: 1.0000 | ORAL_TABLET | Freq: Two times a day (BID) | ORAL | 0 refills | Status: AC

## 2019-02-06 NOTE — ED Triage Notes (Signed)
Pt reports has abscess on r labia.  Reports had abscess in same spot in June.  Pt says she noticed it Wednesday.  Reports chills yesterday and nausea today.

## 2019-02-06 NOTE — ED Provider Notes (Signed)
Emergency Department Provider Note   I have reviewed the triage vital signs and the nursing notes.   HISTORY  Chief Complaint Abscess   HPI Casey Johnston is a 58 y.o. female with PMH of DM, HLD, HTN, and elevated BMI presents to the emergency department for evaluation of return of her perirectal abscess.  Patient reports abscess in the similar location in May which ultimately drained spontaneously but did require hospital admission for antibiotics.  Patient denies any fevers.  She reports some mild chills yesterday with some nausea.  Denies vomiting or diarrhea. No radiation of symptoms. Pain is worse with pressing in the area.    Past Medical History:  Diagnosis Date  . Anxiety and depression   . Diabetes mellitus, type II ([place])    Dr. Dorris Fetch managing  . Frequent headaches chronic   ibuprofen helps some  . History of UTI    Recurrent per pt (approx 3 per year)  . Hyperlipidemia   . Hypertension   . Leukocytosis 12/2015   mild lymphocytosis.  Path smear review reassuring.  Repeat 01/2016 stable.  . Morbid obesity ([place])   . OSA on CPAP     Patient Active Problem List   Diagnosis Date Noted  . Tobacco abuse disorder 10/07/2018  . Perianal abscess 10/06/2018  . Hypokalemia 10/06/2018  . Nausea without vomiting 07/30/2018  . Unintentional weight loss 07/30/2018  . Constipation 06/05/2018  . GERD (gastroesophageal reflux disease) 06/05/2018  . Abdominal pain 06/05/2018  . Uncontrolled type 2 diabetes mellitus with complication, without long-term current use of insulin ([place]) 08/22/2015  . Hyperlipidemia 08/22/2015  . Essential hypertension, benign 08/22/2015  . Morbid obesity due to excess calories ([place]) 08/22/2015  . Personal history of noncompliance with medical treatment, presenting hazards to health 08/22/2015    Past Surgical History:  Procedure Laterality Date  . ABDOMINAL HYSTERECTOMY  1998   Dysfunctional uterine bleeding.  No hx of abnormal paps.  Marland Kitchen  BIOPSY  10/20/2018   Procedure: BIOPSY;  Surgeon: Danie Binder, MD;  [facility];  Service: Endoscopy;;  stomach  . COLONOSCOPY  2012   Dr. Earley Brooke: 1.5 cm pendunculated descending colon polyp (tubulovillous adenoma) and diminutive hyperplastic polyps  . COLONOSCOPY N/A 10/20/2018   Procedure: COLONOSCOPY;  Surgeon: Danie Binder, MD;  [facility];  Service: Endoscopy;  Laterality: N/A;  10:30am  . ESOPHAGOGASTRODUODENOSCOPY N/A 10/20/2018   Procedure: ESOPHAGOGASTRODUODENOSCOPY (EGD);  Surgeon: Danie Binder, MD;  [facility];  Service: Endoscopy;  Laterality: N/A;  . POLYPECTOMY  10/20/2018   Procedure: POLYPECTOMY;  Surgeon: Danie Binder, MD;  [facility];  Service: Endoscopy;;    Allergies Januvia [sitagliptin]  Family History  Problem Relation Age of Onset  . Cancer Mother        ovarian  . COPD Father   . Diabetes Father   . Colon cancer Neg Hx     Social History Social History   Tobacco Use  . Smoking status: Current Every Day Smoker    Packs/day: 0.25    Years: 20.00    Pack years: 5.00    Types: Cigarettes  . Smokeless tobacco: Never Used  Substance Use Topics  . Alcohol use: No  . Drug use: No    Review of Systems  Constitutional: No fever. Mild chills yesterday.  Eyes: No visual changes. ENT: No sore throat. Cardiovascular: Denies chest pain. Respiratory: Denies shortness of breath. Gastrointestinal: No abdominal pain.  No  nausea, no vomiting.  No diarrhea.  No constipation. Genitourinary: Negative for dysuria. Musculoskeletal: Negative for back pain. Skin: Positive perirectal abscess.  Neurological: Negative for headaches, focal weakness or numbness.  10-point ROS otherwise negative.  ____________________________________________   PHYSICAL EXAM:  VITAL SIGNS: Vitals:   02/06/19 0745  BP: (!) 156/72  Pulse: 83  Resp: 18  Temp: 98.1 F (36.7 C)  SpO2: 99%    Constitutional: Alert and  oriented. Well appearing and in no acute distress. Eyes: Conjunctivae are normal. Head: Atraumatic. Nose: No congestion/rhinnorhea. Mouth/Throat: Mucous membranes are moist. Neck: No stridor.  Cardiovascular: Normal rate, regular rhythm. Good peripheral circulation. Grossly normal heart sounds.   Respiratory: Normal respiratory effort.  No retractions. Lungs CTAB. Gastrointestinal: Soft and nontender. No distention. Exam performed with nurse chaperone. Right perirectal abscess noted with fluctuance appreciated medially. 4 cm of surrounding induration. Abscess if 3 cm for the anus. No active drainage.  Musculoskeletal: No lower extremity tenderness nor edema.  Neurologic:  Normal speech and language.  Skin:  Skin is warm, dry and intact. No rash noted. Abscess as above.   ____________________________________________   PROCEDURES  Procedure(s) performed:   Marland KitchenMarland KitchenIncision and Drainage  Date/Time: 02/06/2019 8:23 AM Performed by: Margette Fast, MD Authorized by: Margette Fast, MD   Consent:    Consent obtained:  Verbal   Consent given by:  Patient   Risks discussed:  Bleeding, damage to other organs, incomplete drainage, infection and pain Location:    Type:  Abscess   Size:  4   Location:  Anogenital   Anogenital location:  Perirectal Pre-procedure details:    Skin preparation:  Chloraprep Anesthesia (see MAR for exact dosages):    Anesthesia method:  Local infiltration   Local anesthetic:  Lidocaine 1% w/o epi Procedure type:    Complexity:  Complex Procedure details:    Needle aspiration: no     Incision types:  Single straight   Incision depth:  Subcutaneous   Scalpel blade:  11   Wound management:  Probed and deloculated   Drainage:  Purulent   Drainage amount:  Copious   Wound treatment:  Wound left open   Packing materials:  1/4 in iodoform gauze   Amount 1/4" iodoform:  10 cm Post-procedure details:    Patient tolerance of procedure:  Tolerated well, no  immediate complications    EMERGENCY DEPARTMENT US SOFT TISSUE INTERPRETATION "Study: Limited Soft Tissue Ultrasound"  INDICATIONS: Pain and Soft tissue infection Multiple views of the body part were obtained in real-time with a multi-frequency linear probe PERFORMED BY:  Myself SIDE:Right  BODY PART:Pelvic wall and right perirectal FINDINGS: Abcess present INTERPRETATION:  Abcess present   CPT:  Pelvic wall [id number]  Other soft tissue [id number]   ____________________________________________   INITIAL IMPRESSION / ASSESSMENT AND PLAN / ED COURSE  Pertinent labs & imaging results that were available during my care of the patient were reviewed by me and considered in my medical decision making (see chart for details).   Patient presents to the emergency department for evaluation of right perirectal abscess.  The area is mostly induration with some medial fluctuance.  Fluid confirmed with bedside ultrasound.  Patient's vital signs are within normal limits.  No concern for sepsis.  No concern clinically for deeper space abscess/infection.  I feel that at this time of the area is amenable to bedside incision and drainage.  I discussed the risks and benefits with the patient who provided verbal consent.   08:25  AM  Bedside incision and drainage performed as above.  Patient tolerated well.  The abscess cavity was probed and deloculated.  Cavity is relatively small and approximately 10 cm of iodoform packing gauze was placed.  Patient to return in 48 hours for removal of packing and reassessment of wound.  Consider additional packing at that time if needed.  Will send home with antibiotics.  Discussed ED return precautions in detail. ____________________________________________  FINAL CLINICAL IMPRESSION(S) / ED DIAGNOSES  Final diagnoses:  Abscess of buttock, right     MEDICATIONS GIVEN DURING THIS VISIT:  Medications  lidocaine (PF) (XYLOCAINE) 1 % injection 10 mL (has no  administration in time range)     NEW OUTPATIENT MEDICATIONS STARTED DURING THIS VISIT:  New Prescriptions   SULFAMETHOXAZOLE-TRIMETHOPRIM (BACTRIM DS) 800-160 MG TABLET    Take 1 tablet by mouth 2 (two) times daily for 7 days.    Note:  This document was prepared using Dragon voice recognition software and may include unintentional dictation errors.  Nanda Quinton, MD, [facility]    Long, [facility], MD 02/06/19 [phone]

## 2019-02-06 NOTE — Discharge Instructions (Signed)
You were seen in the emergency department today with return of abscess.  We were able to drain this in the emergency department and are starting you on antibiotics.  You will need to return in 2 days for packing removal and wound reassessment.  Please take the antibiotics as prescribed.  Return to the emergency department sooner with any fevers, chills, worsening pain.  You can expect some light bleeding in the area and some drainage.  If you experience severe or heavy bleeding you also need to return to the emergency department sooner for evaluation.

## 2019-02-08 ENCOUNTER — Encounter (HOSPITAL_COMMUNITY): Payer: Self-pay

## 2019-02-08 ENCOUNTER — Emergency Department (HOSPITAL_COMMUNITY)
Admission: EM | Admit: 2019-02-08 | Discharge: 2019-02-08 | Disposition: A | Discharge status: Home / Self Care | Payer: Self-pay | Attending: Emergency Medicine | Admitting: Emergency Medicine

## 2019-02-08 ENCOUNTER — Other Ambulatory Visit: Payer: Self-pay

## 2019-02-08 DIAGNOSIS — K651 Peritoneal abscess: Secondary | ICD-10-CM | POA: Insufficient documentation

## 2019-02-08 DIAGNOSIS — E119 Type 2 diabetes mellitus without complications: Secondary | ICD-10-CM | POA: Insufficient documentation

## 2019-02-08 DIAGNOSIS — Z48817 Encounter for surgical aftercare following surgery on the skin and subcutaneous tissue: Secondary | ICD-10-CM | POA: Insufficient documentation

## 2019-02-08 DIAGNOSIS — Z09 Encounter for follow-up examination after completed treatment for conditions other than malignant neoplasm: Secondary | ICD-10-CM

## 2019-02-08 DIAGNOSIS — Z794 Long term (current) use of insulin: Secondary | ICD-10-CM | POA: Insufficient documentation

## 2019-02-08 DIAGNOSIS — I10 Essential (primary) hypertension: Secondary | ICD-10-CM | POA: Insufficient documentation

## 2019-02-08 DIAGNOSIS — F17210 Nicotine dependence, cigarettes, uncomplicated: Secondary | ICD-10-CM | POA: Insufficient documentation

## 2019-02-08 NOTE — Discharge Instructions (Addendum)
Please use warm Epson salt soaks daily for about 10 to 15 minutes.  Please finish your Bactrim as previously prescribed.  Use Tylenol every 4 hours as needed for pain and soreness.  This may take several days to completely resolve.  Please return to the emergency department if any high fever, nausea vomiting, red streaks from the abscess area, changes in your condition, or worsening of your symptoms.

## 2019-02-08 NOTE — ED Provider Notes (Signed)
[facility] EMERGENCY DEPARTMENT Provider Note   CSN: [id number] Arrival date & time: 02/08/19  [id number]     History   Chief Complaint Chief Complaint  Patient presents with  . Follow-up    HPI Casey Johnston is a 58 y.o. female.     Patient is a 59 year old female who presents to the emergency department for follow-up and recheck of an abscess.  The patient developed an abscess of the right pelvic wall.  She presented to the emergency department on September 18 and had incision and drainage.  She was asked to return today for follow-up.  The patient states she has not had any fever.  She is not had nausea or vomiting.  She has noted some drainage from the incision and drainage site.  The history is provided by the patient.    Past Medical History:  Diagnosis Date  . Anxiety and depression   . Diabetes mellitus, type II ([place])    Dr. Dorris Fetch managing  . Frequent headaches chronic   ibuprofen helps some  . History of UTI    Recurrent per pt (approx 3 per year)  . Hyperlipidemia   . Hypertension   . Leukocytosis 12/2015   mild lymphocytosis.  Path smear review reassuring.  Repeat 01/2016 stable.  . Morbid obesity ([place])   . OSA on CPAP     Patient Active Problem List   Diagnosis Date Noted  . Tobacco abuse disorder 10/07/2018  . Perianal abscess 10/06/2018  . Hypokalemia 10/06/2018  . Nausea without vomiting 07/30/2018  . Unintentional weight loss 07/30/2018  . Constipation 06/05/2018  . GERD (gastroesophageal reflux disease) 06/05/2018  . Abdominal pain 06/05/2018  . Uncontrolled type 2 diabetes mellitus with complication, without long-term current use of insulin ([place]) 08/22/2015  . Hyperlipidemia 08/22/2015  . Essential hypertension, benign 08/22/2015  . Morbid obesity due to excess calories ([place]) 08/22/2015  . Personal history of noncompliance with medical treatment, presenting hazards to health 08/22/2015    Past Surgical History:  Procedure Laterality Date   . ABDOMINAL HYSTERECTOMY  1998   Dysfunctional uterine bleeding.  No hx of abnormal paps.  Marland Kitchen BIOPSY  10/20/2018   Procedure: BIOPSY;  Surgeon: Danie Binder, MD;  Location: [facility];  Service: Endoscopy;;  stomach  . COLONOSCOPY  2012   Dr. Earley Brooke: 1.5 cm pendunculated descending colon polyp (tubulovillous adenoma) and diminutive hyperplastic polyps  . COLONOSCOPY N/A 10/20/2018   Procedure: COLONOSCOPY;  Surgeon: Danie Binder, MD;  Location: [facility];  Service: Endoscopy;  Laterality: N/A;  10:30am  . ESOPHAGOGASTRODUODENOSCOPY N/A 10/20/2018   Procedure: ESOPHAGOGASTRODUODENOSCOPY (EGD);  Surgeon: Danie Binder, MD;  Location: [facility];  Service: Endoscopy;  Laterality: N/A;  . POLYPECTOMY  10/20/2018   Procedure: POLYPECTOMY;  Surgeon: Danie Binder, MD;  Location: [facility];  Service: Endoscopy;;     OB History    Gravida  3   Para  1   Term  1   Preterm      AB  2   Living        SAB  2   TAB      Ectopic      Multiple      Live Births               Home Medications    Prior to Admission medications   Medication Sig Start Date End Date Taking? Authorizing Provider  amLODipine (NORVASC) 10 MG tablet TAKE 1  Tablet BY MOUTH ONCE DAILY 12/18/18   Soyla Dryer, PA-C  atorvastatin (LIPITOR) 20 MG tablet Take 1 tablet (20 mg total) by mouth daily. 10/14/18   Soyla Dryer, PA-C  insulin glargine (LANTUS) 100 UNIT/ML injection Inject 60 Units into the skin at bedtime.     [provider]  lisinopril (PRINIVIL,ZESTRIL) 20 MG tablet Take 2 tablets (40 mg total) by mouth daily. 07/31/18   Soyla Dryer, PA-C  lubiprostone (AMITIZA) 24 MCG capsule 1 PO QHS FOR 7 DAYS THEN 1 PO BID. TAKE WITH FOOD. Patient taking differently: Take 24 mcg by mouth 2 (two) times daily. 1 PO QHS FOR 7 DAYS THEN 1 PO BID. TAKE WITH FOOD. 10/20/18   Danie Binder, MD  metFORMIN (GLUCOPHAGE) 1000 MG tablet TAKE 1 Tablet  BY MOUTH TWICE DAILY WITH A MEAL  12/18/18   Soyla Dryer, PA-C  metoprolol tartrate (LOPRESSOR) 100 MG tablet Take 1 tablet (100 mg total) by mouth 2 (two) times daily. Patient taking differently: Take 50 mg by mouth 2 (two) times daily.  12/18/18   Soyla Dryer, PA-C  Omega-3 Fatty Acids (FISH OIL PO) Take 1 capsule by mouth daily.     [provider]  pantoprazole (PROTONIX) 40 MG tablet 1 po 30 mins prior to first meal 10/20/18   Fields, Sandi L, MD  sulfamethoxazole-trimethoprim (BACTRIM DS) 800-160 MG tablet Take 1 tablet by mouth 2 (two) times daily for 7 days. 02/06/19 02/13/19  Long, Wonda Olds, MD    Family History Family History  Problem Relation Age of Onset  . Cancer Mother        ovarian  . COPD Father   . Diabetes Father   . Colon cancer Neg Hx     Social History Social History   Tobacco Use  . Smoking status: Current Every Day Smoker    Packs/day: 0.25    Years: 20.00    Pack years: 5.00    Types: Cigarettes  . Smokeless tobacco: Never Used  Substance Use Topics  . Alcohol use: No  . Drug use: No     Allergies   Januvia [sitagliptin]   Review of Systems Review of Systems  Constitutional: Negative for activity change and appetite change.  HENT: Negative for congestion, ear discharge, ear pain, facial swelling, nosebleeds, rhinorrhea, sneezing and tinnitus.   Eyes: Negative for photophobia, pain and discharge.  Respiratory: Negative for cough, choking, shortness of breath and wheezing.   Cardiovascular: Negative for chest pain, palpitations and leg swelling.  Gastrointestinal: Negative for abdominal pain, blood in stool, constipation, diarrhea, nausea and vomiting.  Genitourinary: Negative for difficulty urinating, dysuria, flank pain, frequency and hematuria.  Musculoskeletal: Negative for back pain, gait problem, myalgias and neck pain.  Skin: Positive for wound. Negative for color change and rash.       Abscess of the pelvic wall.  Neurological: Negative for dizziness,  seizures, syncope, facial asymmetry, speech difficulty, weakness and numbness.  Hematological: Negative for adenopathy. Does not bruise/bleed easily.  Psychiatric/Behavioral: Negative for agitation, confusion, hallucinations, self-injury and suicidal ideas. The patient is not nervous/anxious.      Physical Exam Updated Vital Signs BP (!) 154/68 (BP Location: Right Arm)   Pulse 68   Temp 98.8 F (37.1 C) (Oral)   Resp 12   SpO2 100%   Physical Exam Vitals signs and nursing note reviewed. Exam conducted with a chaperone present.  Constitutional:      Appearance: She is well-developed. She is not toxic-appearing.  HENT:  Head: Normocephalic.     Right Ear: Tympanic membrane and external ear normal.     Left Ear: Tympanic membrane and external ear normal.  Eyes:     General: Lids are normal.     Pupils: Pupils are equal, round, and reactive to light.  Neck:     Musculoskeletal: Normal range of motion and neck supple.     Vascular: No carotid bruit.  Cardiovascular:     Rate and Rhythm: Normal rate and regular rhythm.     Pulses: Normal pulses.     Heart sounds: Normal heart sounds.  Pulmonary:     Effort: No respiratory distress.     Breath sounds: Normal breath sounds.  Abdominal:     General: Bowel sounds are normal.     Palpations: Abdomen is soft.     Tenderness: There is no abdominal tenderness. There is no guarding.  Genitourinary:    Comments: Abscess to the right pelvic wall/perirectal area assessed.  The packing has come out.  The patient states the swelling has improved significantly.  She is continues to have some tenderness.  The patient continues to have some induration present.  No red streaks appreciated. Musculoskeletal: Normal range of motion.  Lymphadenopathy:     Head:     Right side of head: No submandibular adenopathy.     Left side of head: No submandibular adenopathy.     Cervical: No cervical adenopathy.  Skin:    General: Skin is warm and dry.   Neurological:     Mental Status: She is alert and oriented to person, place, and time.     Cranial Nerves: No cranial nerve deficit.     Sensory: No sensory deficit.  Psychiatric:        Speech: Speech normal.      ED Treatments / Results  Labs (all labs ordered are listed, but only abnormal results are displayed) Labs Reviewed - No data to display  EKG None  Radiology No results found.  Procedures Procedures (including critical care time)  Medications Ordered in ED Medications - No data to display   Initial Impression / Assessment and Plan / ED Course  I have reviewed the triage vital signs and the nursing notes.  Pertinent labs & imaging results that were available during my care of the patient were reviewed by me and considered in my medical decision making (see chart for details).          Final Clinical Impressions(s) / ED Diagnoses MDM  Vital signs reviewed.  The abscess area is improving according to the patient.  There is some tenderness present.  The packing has come out.  There is mild induration still present.  The patient is currently using Bactrim 2 times daily.  I have asked her to continue this medication.  I have asked her to use warm Epson salt soaks.  I have given her strict return instructions to come back to the emergency department if any fever, nausea vomiting, worsening of the abscess, red streaks, changes in her condition, problems or concerns.  Patient is in agreement with this plan.   Final diagnoses:  Encounter for recheck of abscess following incision and drainage    ED Discharge Orders    None       Lily Kocher, PA-C 02/08/19 [id number]    Milton Ferguson, MD 02/12/19 [id number]

## 2019-02-08 NOTE — ED Triage Notes (Signed)
Pt here for follow up of vaginal abscess. NAD. No issues.

## 2019-02-13 ENCOUNTER — Other Ambulatory Visit (HOSPITAL_COMMUNITY)
Admission: RE | Admit: 2019-02-13 | Discharge: 2019-02-13 | Disposition: A | Discharge status: Home / Self Care | Payer: Self-pay | Source: Ambulatory Visit | Attending: Physician Assistant | Admitting: Physician Assistant

## 2019-02-13 DIAGNOSIS — I10 Essential (primary) hypertension: Secondary | ICD-10-CM | POA: Insufficient documentation

## 2019-02-13 DIAGNOSIS — E119 Type 2 diabetes mellitus without complications: Secondary | ICD-10-CM | POA: Insufficient documentation

## 2019-02-13 DIAGNOSIS — E785 Hyperlipidemia, unspecified: Secondary | ICD-10-CM | POA: Insufficient documentation

## 2019-02-13 LAB — COMPREHENSIVE METABOLIC PANEL
ALT: 14 U/L (ref 0–44)
AST: 17 U/L (ref 15–41)
Albumin: 3.7 g/dL (ref 3.5–5.0)
Alkaline Phosphatase: 80 U/L (ref 38–126)
Anion gap: 7 (ref 5–15)
BUN: 10 mg/dL (ref 6–20)
CO2: 28 mmol/L (ref 22–32)
Calcium: 9.2 mg/dL (ref 8.9–10.3)
Chloride: 105 mmol/L (ref 98–111)
Creatinine, Ser: 0.59 mg/dL (ref 0.44–1.00)
GFR calc Af Amer: >60 mL/min (ref >60)
GFR calc non Af Amer: >60 mL/min (ref >60)
Glucose, Bld: 134 mg/dL — ABNORMAL HIGH (ref 70–99)
Potassium: 3.6 mmol/L (ref 3.5–5.1)
Sodium: 140 mmol/L (ref 135–145)
Total Bilirubin: 0.3 mg/dL (ref 0.3–1.2)
Total Protein: 7.5 g/dL (ref 6.5–8.1)

## 2019-02-13 LAB — LIPID PANEL
Cholesterol: 133 mg/dL (ref 0–200)
HDL: 36 mg/dL — ABNORMAL LOW (ref >40)
LDL Cholesterol: 66 mg/dL (ref 0–99)
Total CHOL/HDL Ratio: 3.7 RATIO
Triglycerides: 155 mg/dL — ABNORMAL HIGH (ref <150)
VLDL: 31 mg/dL (ref 0–40)

## 2019-02-13 LAB — HEMOGLOBIN A1C
Hgb A1c MFr Bld: 7.7 % — ABNORMAL HIGH (ref 4.8–5.6)
Mean Plasma Glucose: 174.29 mg/dL

## 2019-02-16 ENCOUNTER — Ambulatory Visit: Payer: Self-pay | Admitting: Physician Assistant

## 2019-02-18 ENCOUNTER — Ambulatory Visit: Payer: Self-pay | Admitting: Physician Assistant

## 2019-02-18 ENCOUNTER — Other Ambulatory Visit: Payer: Self-pay

## 2019-02-18 ENCOUNTER — Encounter: Payer: Self-pay | Admitting: Physician Assistant

## 2019-02-18 VITALS — BP 126/60 | HR 64 | Temp 98.6°F | Wt 185.2 lb

## 2019-02-18 DIAGNOSIS — R05 Cough: Secondary | ICD-10-CM

## 2019-02-18 DIAGNOSIS — Z20822 Contact with and (suspected) exposure to covid-19: Secondary | ICD-10-CM

## 2019-02-18 DIAGNOSIS — R059 Cough, unspecified: Secondary | ICD-10-CM

## 2019-02-18 DIAGNOSIS — E785 Hyperlipidemia, unspecified: Secondary | ICD-10-CM

## 2019-02-18 DIAGNOSIS — I10 Essential (primary) hypertension: Secondary | ICD-10-CM

## 2019-02-18 DIAGNOSIS — E119 Type 2 diabetes mellitus without complications: Secondary | ICD-10-CM

## 2019-02-18 DIAGNOSIS — R519 Headache, unspecified: Secondary | ICD-10-CM

## 2019-02-18 DIAGNOSIS — F17200 Nicotine dependence, unspecified, uncomplicated: Secondary | ICD-10-CM

## 2019-02-18 MED ORDER — ALBUTEROL SULFATE HFA 108 (90 BASE) MCG/ACT IN AERS: 2.0000 | INHALATION_SPRAY | Freq: Four times a day (QID) | RESPIRATORY_TRACT | 0 refills | Status: DC | PRN

## 2019-02-18 NOTE — Patient Instructions (Signed)
Insomnia Insomnia is a sleep disorder that makes it difficult to fall asleep or stay asleep. Insomnia can cause fatigue, low energy, difficulty concentrating, mood swings, and poor performance at work or school. There are three different ways to classify insomnia:  Difficulty falling asleep.  Difficulty staying asleep.  Waking up too early in the morning. Any type of insomnia can be long-term (chronic) or short-term (acute). Both are common. Short-term insomnia usually lasts for three months or less. Chronic insomnia occurs at least three times a week for longer than three months. What are the causes? Insomnia may be caused by another condition, situation, or substance, such as:  Anxiety.  Certain medicines.  Gastroesophageal reflux disease (GERD) or other gastrointestinal conditions.  Asthma or other breathing conditions.  Restless legs syndrome, sleep apnea, or other sleep disorders.  Chronic pain.  Menopause.  Stroke.  Abuse of alcohol, tobacco, or illegal drugs.  Mental health conditions, such as depression.  Caffeine.  Neurological disorders, such as Alzheimer's disease.  An overactive thyroid (hyperthyroidism). Sometimes, the cause of insomnia may not be known. What increases the risk? Risk factors for insomnia include:  Gender. Women are affected more often than men.  Age. Insomnia is more common as you get older.  Stress.  Lack of exercise.  Irregular work schedule or working night shifts.  Traveling between different time zones.  Certain medical and mental health conditions. What are the signs or symptoms? If you have insomnia, the main symptom is having trouble falling asleep or having trouble staying asleep. This may lead to other symptoms, such as:  Feeling fatigued or having low energy.  Feeling nervous about going to sleep.  Not feeling rested in the morning.  Having trouble concentrating.  Feeling irritable, anxious, or depressed. How  is this diagnosed? This condition may be diagnosed based on:  Your symptoms and medical history. Your health care provider may ask about: ? Your sleep habits. ? Any medical conditions you have. ? Your mental health.  A physical exam. How is this treated? Treatment for insomnia depends on the cause. Treatment may focus on treating an underlying condition that is causing insomnia. Treatment may also include:  Medicines to help you sleep.  Counseling or therapy.  Lifestyle adjustments to help you sleep better. Follow these instructions at home: Eating and drinking   Limit or avoid alcohol, caffeinated beverages, and cigarettes, especially close to bedtime. These can disrupt your sleep.  Do not eat a large meal or eat spicy foods right before bedtime. This can lead to digestive discomfort that can make it hard for you to sleep. Sleep habits   Keep a sleep diary to help you and your health care provider figure out what could be causing your insomnia. Write down: ? When you sleep. ? When you wake up during the night. ? How well you sleep. ? How rested you feel the next day. ? Any side effects of medicines you are taking. ? What you eat and drink.  Make your bedroom a dark, comfortable place where it is easy to fall asleep. ? Put up shades or blackout curtains to block light from outside. ? Use a white noise machine to block noise. ? Keep the temperature cool.  Limit screen use before bedtime. This includes: ? Watching TV. ? Using your smartphone, tablet, or computer.  Stick to a routine that includes going to bed and waking up at the same times every day and night. This can help you fall asleep faster. Consider   making a quiet activity, such as reading, part of your nighttime routine.  Try to avoid taking naps during the day so that you sleep better at night.  Get out of bed if you are still awake after 15 minutes of trying to sleep. Keep the lights down, but try reading or  doing a quiet activity. When you feel sleepy, go back to bed. General instructions  Take over-the-counter and prescription medicines only as told by your health care provider.  Exercise regularly, as told by your health care provider. Avoid exercise starting several hours before bedtime.  Use relaxation techniques to manage stress. Ask your health care provider to suggest some techniques that may work well for you. These may include: ? Breathing exercises. ? Routines to release muscle tension. ? Visualizing peaceful scenes.  Make sure that you drive carefully. Avoid driving if you feel very sleepy.  Keep all follow-up visits as told by your health care provider. This is important. Contact a health care provider if:  You are tired throughout the day.  You have trouble in your daily routine due to sleepiness.  You continue to have sleep problems, or your sleep problems get worse. Get help right away if:  You have serious thoughts about hurting yourself or someone else. If you ever feel like you may hurt yourself or others, or have thoughts about taking your own life, get help right away. You can go to your nearest emergency department or call:  Your local emergency services (911 in the [place]  A suicide crisis helpline, such as the [facility] at 727-603-9576. This is open 24 hours a day. Summary  Insomnia is a sleep disorder that makes it difficult to fall asleep or stay asleep.  Insomnia can be long-term (chronic) or short-term (acute).  Treatment for insomnia depends on the cause. Treatment may focus on treating an underlying condition that is causing insomnia.  Keep a sleep diary to help you and your health care provider figure out what could be causing your insomnia. This information is not intended to replace advice given to you by your health care provider. Make sure you discuss any questions you have with your health care provider. Document  Released: 05/04/2000 Document Revised: 04/19/2017 Document Reviewed: 02/14/2017 Elsevier Patient Education  2020 [employer].  ------------------------------------------------   How to Use a Soft Mist Inhaler  A soft mist inhaler is a handheld device for taking medicine that you breathe (inhale) into your lungs. The device changes a liquid medicine into a mist that can be inhaled. You may need a soft mist inhaler if you have a disease that causes your breathing tubes to narrow (bronchospasm). Using a soft mist inhaler helps prevent bronchospasm and keeps your airway open. A soft mist inhaler may be part of your long-term treatment for asthma or chronic obstructive pulmonary disease (COPD). The usual dosage is two inhalations every day. What are the risks?  If you do not use your inhaler correctly, medicine might not reach your lungs to help you breathe.  The medicine in the inhaler can cause side effects, such as: ? Chest tightness or difficulty breathing. ? Eye redness, eye pain, or vision changes. ? Difficulty passing urine. ? Dry mouth. ? Sore throat. ? Cough. ? Headache. ? Sinus congestion (sinusitis). Supplies needed:  Inhaler. The medicine that you will need comes in the inhaler. Each device contains the amount of medicine needed for 60 uses (30 daily doses of 2 inhalations). How to use a  soft mist inhaler Using an inhaler for the first time 1. Remove the clear base of the inhaler by pressing the safety catch on the cap with your thumb and pulling off the clear base with your other hand. 2. On the label, write down the date that will be three months from now. This is the date you should throw away the inhaler. 3. Place the medicine cartridge that comes with the inhaler into the base of the inhaler. Press the cartridge on a flat surface to click it into place. Click the clear plastic base back into place over the cartridge. 4. Turn the clear base in the direction of the arrows  on the label until you hear a click. 5. Open the cap on top of the inhaler. It should snap open all the way to show you the mouthpiece. 6. Prepare (prime) the inhaler for use. To do this, point the inhaler toward the ground and press on the dose-release button below the mouthpiece. You should see the release of some mist. Be careful not to get any mist into your eyes. If you do not see mist, turn the base, open the cap, and prime the inhaler again until you see the mist. If you still do not see the mist, return the inhaler to your pharmacist for help. Taking an inhaled dose 1. Hold the inhaler upright. 2. Use your thumb and pointer finger to turn the base of the inhaler until you hear a click. This means the dose chamber is ready to deliver the medicine. 3. Open the cap until you hear a click. 4. Hold the inhaler in one hand with your pointer finger over the dose-release button. 5. Turn your head away from the inhaler and breathe out slowly. 6. Close your lips around the mouthpiece. 7. Point the inhaler toward the back of your mouth. 8. Press the dose-release button while taking a slow, deep breath through your mouth. 9. Hold your breath for 10 seconds, or as long as you can. 10. Turn your head away from the inhaler and breath out slowly through pursed lips. 11. Take a second inhalation, if your health care provider told you to. Do not take extra doses if you do not feel the mist as you inhale. Follow these instructions at home: General instructions  Check the indicator on the inhaler to keep track of your doses. When the indicator is in the red zone, you have 7 days left. Get a refill at this time. The inhaler will lock when it is empty.  Throw away your inhaler if you have not used it in more than 30 days.  Do not use any products that contain nicotine or tobacco, such as cigarettes and e-cigarettes. If you need help quitting, ask your health care provider.  Tell your provider about: ? All  your medical conditions. Use soft mist medicine with caution if you have glaucoma, an enlarged prostate, or kidney disease. ? If you are or may become pregnant. ? All medicines you take. Some medicines can affect (interact with) the medicines in your inhaler.  Keep all follow-up visits as told by your health care provider. This is important. Using your inhaler  Use your soft mist inhaler only as told by your health care provider.  Do inhalations at about the same time each day.  If you have not used your inhaler for more than 3 days, release a mist dose toward the ground before using.  If you have not used your inhaler for  more than 21 days, open the cap, turn the base, and prime your inhaler until you see mist. Repeat these steps three more times before using the inhaler. Caring for your inhaler  Store your soft mist inhaler at room temperature and keep it out of reach of children.  Clean the mouthpiece of your inhaler with a damp, clean, cloth once every week. Contact a health care provider if:  You have a very dry mouth or sore throat.  You have a fever.  You have stuffy nose (nasal congestion) or nasal discharge.  You have a cough that does not go away (is persistent).  You have a headache.  You have trouble passing urine.  You are not sure how to use your inhaler or your inhaler is not working properly. Get help right away if:  You have an allergic reaction. Some symptoms of an allergic reaction are an itchy rash, swelling of your face or tongue, or difficulty breathing.  You have severe and sudden eye pain or changes in your vision. Summary  A soft mist inhaler is a treatment for COPD or asthma.  You may have to take two inhalations each day on a long-term basis to prevent bronchospasm.  Follow instructions carefully in order to use your inhaler properly.  Common side effects include throat or sinus infection, dry mouth, cough, and headache.  Get help right away  if you have an allergic reaction, sudden eye pain, or changes in vision. This information is not intended to replace advice given to you by your health care provider. Make sure you discuss any questions you have with your health care provider. Document Released: 04/26/2016 Document Revised: 04/19/2017 Document Reviewed: 04/26/2016 Elsevier Patient Education  2020 [employer].

## 2019-02-18 NOTE — Progress Notes (Signed)
BP 126/60   Pulse 64   Temp 98.6 F (37 C)   Wt 185 lb 3.2 oz (84 kg)   SpO2 98%   BMI 30.82 kg/m    Subjective:    Patient ID: Casey Johnston, female    DOB: Sep 20, 1960, 58 y.o.   MRN: [id number]  HPI: Casey Johnston is a 58 y.o. female presenting on 02/18/2019 for Diabetes, Hypertension, Hyperlipidemia, and Mental Health Problem   HPI   Pt had a negative covid 19 screening questionnaire    Pt had influenza immuniaztion earlier this week  She says her perirectal abscess is improved.   Pt c/o HA and cough since June.   She hasn't mentioned it prior to today.   HA comes and goes.  Cough awakens her at night.   She has never used an inhaler.   No sob.  No fever.    Pt never goes for walks or exerts herself.     She is still smoking about 6/day.    She has no known covid exposures.  She says she wears mask in public.  The daughter that she lives with works in an urgent care facility.   Sometimes HA is above R eye and other times R occipatal area.    She has been taking APAP but she says it doesn't help.    She says her Mood is okay.  She says she still has a lot of stress.  She says she is only sleeping about 4 hours/night.    She is still searching for employment but says she hasn't had any success.  Pt has follow up scheduled with GI for constipation      Relevant past medical, surgical, family and social history reviewed and updated as indicated. Interim medical history since our last visit reviewed. Allergies and medications reviewed and updated.    Current Outpatient Medications:  .  amLODipine (NORVASC) 10 MG tablet, TAKE 1 Tablet BY MOUTH ONCE DAILY, Disp: 90 tablet, Rfl: 0 .  atorvastatin (LIPITOR) 20 MG tablet, Take 1 tablet (20 mg total) by mouth daily., Disp: 90 tablet, Rfl: 2 .  insulin glargine (LANTUS) 100 UNIT/ML injection, Inject 60 Units into the skin at bedtime. , Disp: , Rfl:  .  lisinopril (PRINIVIL,ZESTRIL) 20 MG tablet, Take 2 tablets (40  mg total) by mouth daily., Disp: 180 tablet, Rfl: 1 .  lubiprostone (AMITIZA) 24 MCG capsule, 1 PO QHS FOR 7 DAYS THEN 1 PO BID. TAKE WITH FOOD. (Patient taking differently: Take 24 mcg by mouth 2 (two) times daily. 1 PO QHS FOR 7 DAYS THEN 1 PO BID. TAKE WITH FOOD.), Disp: 62 capsule, Rfl: 11 .  metFORMIN (GLUCOPHAGE) 1000 MG tablet, TAKE 1 Tablet  BY MOUTH TWICE DAILY WITH A MEAL, Disp: 90 tablet, Rfl: 1 .  metoprolol tartrate (LOPRESSOR) 100 MG tablet, Take 1 tablet (100 mg total) by mouth 2 (two) times daily., Disp: 180 tablet, Rfl: 0 .  Omega-3 Fatty Acids (FISH OIL PO), Take 1 capsule by mouth daily. , Disp: , Rfl:  .  pantoprazole (PROTONIX) 40 MG tablet, 1 po 30 mins prior to first meal, Disp: 30 tablet, Rfl: 11   Review of Systems  Per HPI unless specifically indicated above     Objective:    BP 126/60   Pulse 64   Temp 98.6 F (37 C)   Wt 185 lb 3.2 oz (84 kg)   SpO2 98%   BMI 30.82 kg/m   Wt  Readings from Last 3 Encounters:  02/18/19 185 lb 3.2 oz (84 kg)  02/06/19 185 lb (83.9 kg)  01/21/19 181 lb 3.2 oz (82.2 kg)    Physical Exam Vitals signs reviewed.  Constitutional:      General: She is not in acute distress.    Appearance: Normal appearance. She is well-developed.     Comments: Pt declined to have peri-rectal abscess area re-checked  HENT:     Head: Normocephalic and atraumatic.     Right Ear: Tympanic membrane normal.     Left Ear: Tympanic membrane normal.     Nose:     Right Sinus: No maxillary sinus tenderness or frontal sinus tenderness.     Left Sinus: No maxillary sinus tenderness or frontal sinus tenderness.  Eyes:     Extraocular Movements: Extraocular movements intact.     Right eye: Normal extraocular motion.     Left eye: Normal extraocular motion.     Pupils: Pupils are equal, round, and reactive to light.  Neck:     Musculoskeletal: Neck supple.  Cardiovascular:     Rate and Rhythm: Normal rate and regular rhythm.  Pulmonary:      Effort: Pulmonary effort is normal.     Breath sounds: Normal breath sounds.  Abdominal:     General: Bowel sounds are normal.     Palpations: Abdomen is soft. There is no mass.     Tenderness: There is no abdominal tenderness.  Musculoskeletal:     Right lower leg: No edema.     Left lower leg: No edema.  Lymphadenopathy:     Cervical: No cervical adenopathy.  Skin:    General: Skin is warm and dry.  Neurological:     Mental Status: She is alert and oriented to person, place, and time.     Cranial Nerves: No facial asymmetry.     Sensory: Sensation is intact.     Gait: Gait normal.  Psychiatric:        Attention and Perception: Attention normal.        Mood and Affect: Affect is flat.        Speech: Speech normal.        Behavior: Behavior normal. Behavior is cooperative.     Results for orders placed or performed during the hospital encounter of 02/13/19  Lipid panel  Result Value Ref Range   Cholesterol 133 0 - 200 mg/dL   Triglycerides 155 (H) <150 mg/dL   HDL 36 (L) >40 mg/dL   Total CHOL/HDL Ratio 3.7 RATIO   VLDL 31 0 - 40 mg/dL   LDL Cholesterol 66 0 - 99 mg/dL  Comprehensive metabolic panel  Result Value Ref Range   Sodium 140 135 - 145 mmol/L   Potassium 3.6 3.5 - 5.1 mmol/L   Chloride 105 98 - 111 mmol/L   CO2 28 22 - 32 mmol/L   Glucose, Bld 134 (H) 70 - 99 mg/dL   BUN 10 6 - 20 mg/dL   Creatinine, Ser 0.59 0.44 - 1.00 mg/dL   Calcium 9.2 8.9 - 10.3 mg/dL   Total Protein 7.5 6.5 - 8.1 g/dL   Albumin 3.7 3.5 - 5.0 g/dL   AST 17 15 - 41 U/L   ALT 14 0 - 44 U/L   Alkaline Phosphatase 80 38 - 126 U/L   Total Bilirubin 0.3 0.3 - 1.2 mg/dL   GFR calc non Af Amer >60 >60 mL/min   GFR calc Af Amer >60 >60 mL/min  Anion gap 7 5 - 15  Hemoglobin A1c  Result Value Ref Range   Hgb A1c MFr Bld 7.7 (H) 4.8 - 5.6 %   Mean Plasma Glucose 174.29 mg/dL      Assessment & Plan:    Encounter Diagnoses  Name Primary?  . Diabetes mellitus without complication  ([place]) Yes  . Essential hypertension, benign   . Cough   . Nonintractable headache, unspecified chronicity pattern, unspecified headache type   . Tobacco use disorder   . Hyperlipidemia, unspecified hyperlipidemia type   \   -Reviewed labs with pt   -she has annual diabetic eye exam in October  -she is to continue her routine meds.  Counseled to watch carbs/sugars.  a1c has improved from 15.0 a year ago to today's 7.7.  Congratulated pt on improvement.  -encouraged her to contact [facility] for help with stress  -will get coronarviaus  Test as this needs to be ruled out.  If negatvie, will check cxr  -rx inhaler.  Pt is given reading information to help with proper use of the inhaler  -counseled pt on need for regular Sleep.  Discussed good sleep habits.  Gave handout on insomnia  -encouraged regular exercise to help with her diabetes, lipids, weight management, mood and insomnia.    -will follow up with telemedicine appointment in 2-3 weeks.  She is to contact office sooner prn worsening or new symptoms

## 2019-02-19 LAB — NOVEL CORONAVIRUS, NAA: SARS-CoV-2, NAA: NOT DETECTED

## 2019-03-04 ENCOUNTER — Ambulatory Visit: Payer: HRSA Program | Admitting: Physician Assistant

## 2019-03-04 ENCOUNTER — Encounter: Payer: Self-pay | Admitting: Physician Assistant

## 2019-03-04 DIAGNOSIS — R519 Headache, unspecified: Secondary | ICD-10-CM

## 2019-03-04 DIAGNOSIS — E669 Obesity, unspecified: Secondary | ICD-10-CM

## 2019-03-04 DIAGNOSIS — F17200 Nicotine dependence, unspecified, uncomplicated: Secondary | ICD-10-CM

## 2019-03-04 DIAGNOSIS — E119 Type 2 diabetes mellitus without complications: Secondary | ICD-10-CM

## 2019-03-04 DIAGNOSIS — E785 Hyperlipidemia, unspecified: Secondary | ICD-10-CM

## 2019-03-04 DIAGNOSIS — I10 Essential (primary) hypertension: Secondary | ICD-10-CM

## 2019-03-04 DIAGNOSIS — F418 Other specified anxiety disorders: Secondary | ICD-10-CM

## 2019-03-04 NOTE — Progress Notes (Signed)
There were no vitals taken for this visit.   Subjective:    Patient ID: Casey Johnston, female    DOB: 10/01/1960, 58 y.o.   MRN: [id number]  HPI: Casey Johnston is a 58 y.o. female presenting on 03/04/2019 for Headache, Insomnia, and Cough   HPI   This is a telemedicine appointment through [employer] due to coronavirus pandemic  I connected with  [place] on 03/04/2019 by a video enabled telemedicine application and verified that I am speaking with the correct person using two identifiers.   I discussed the limitations of evaluation and management by telemedicine. The patient expressed understanding and agreed to proceed.  Pt is at home.  Provider is at office      She is doing okay.  She says she still has nagging HA.  She says her cough is improved but still with a little bit.  She is still smoking.   She has been using her inhaler.  She has been using it when she coughs.   It seems to help.     She is still not sleeping but about 5 hours/night .  She did not contact  [facility] for counseling as recommended.  No particular reason.   She says she just hasn't gotten around to it.  She does daily stuff she says.  She says she puts off doing regular tasks (like balancing checkbook) until she has to.  She says that is a change.    She is still looking for a job.    Her HA comes and goes throughout the day.    She says she is having some depression.  Very seldom cries.  Nothing she does makes her feel happy.  She does talk on phone with friends and family- it does perk her up a little bit.   Patient denies SI, HI.     Relevant past medical, surgical, family and social history reviewed and updated as indicated. Interim medical history since our last visit reviewed. Allergies and medications reviewed and updated.   Current Outpatient Medications:  .  albuterol (VENTOLIN HFA) 108 (90 Base) MCG/ACT inhaler, Inhale 2 puffs into the lungs every 6 (six) hours as needed  for wheezing or shortness of breath., Disp: 18 g, Rfl: 0 .  amLODipine (NORVASC) 10 MG tablet, TAKE 1 Tablet BY MOUTH ONCE DAILY, Disp: 90 tablet, Rfl: 0 .  atorvastatin (LIPITOR) 20 MG tablet, Take 1 tablet (20 mg total) by mouth daily., Disp: 90 tablet, Rfl: 2 .  insulin glargine (LANTUS) 100 UNIT/ML injection, Inject 60 Units into the skin at bedtime. , Disp: , Rfl:  .  lisinopril (PRINIVIL,ZESTRIL) 20 MG tablet, Take 2 tablets (40 mg total) by mouth daily., Disp: 180 tablet, Rfl: 1 .  lubiprostone (AMITIZA) 24 MCG capsule, 1 PO QHS FOR 7 DAYS THEN 1 PO BID. TAKE WITH FOOD. (Patient taking differently: Take 24 mcg by mouth 2 (two) times daily. 1 PO QHS FOR 7 DAYS THEN 1 PO BID. TAKE WITH FOOD.), Disp: 62 capsule, Rfl: 11 .  metFORMIN (GLUCOPHAGE) 1000 MG tablet, TAKE 1 Tablet  BY MOUTH TWICE DAILY WITH A MEAL, Disp: 90 tablet, Rfl: 1 .  metoprolol tartrate (LOPRESSOR) 100 MG tablet, Take 1 tablet (100 mg total) by mouth 2 (two) times daily., Disp: 180 tablet, Rfl: 0 .  Omega-3 Fatty Acids (FISH OIL PO), Take 1 capsule by mouth daily. , Disp: , Rfl:  .  pantoprazole (PROTONIX) 40 MG tablet, 1 po 30  mins prior to first meal, Disp: 30 tablet, Rfl: 11   Review of Systems  Per HPI unless specifically indicated above     Objective:    There were no vitals taken for this visit.  Wt Readings from Last 3 Encounters:  02/18/19 185 lb 3.2 oz (84 kg)  02/06/19 185 lb (83.9 kg)  01/21/19 181 lb 3.2 oz (82.2 kg)    Physical Exam Constitutional:      General: She is not in acute distress.    Appearance: She is not ill-appearing.  HENT:     Head: Normocephalic and atraumatic.  Pulmonary:     Effort: No respiratory distress.  Neurological:     Mental Status: She is alert and oriented to person, place, and time.  Psychiatric:        Attention and Perception: Attention normal.        Speech: Speech normal.        Behavior: Behavior is cooperative.     Results for orders placed or performed  in visit on 02/18/19  Novel Coronavirus, NAA ([facility])   Specimen: Oropharyngeal(OP) collection in vial transport medium   OROPHARYNGEA  TESTING  Result Value Ref Range   SARS-CoV-2, NAA Not Detected Not Detected      Assessment & Plan:    Encounter Diagnoses  Name Primary?  . Depression with anxiety Yes  . Tobacco use disorder   . Diabetes mellitus without complication ([place])   . Essential hypertension, benign   . Hyperlipidemia, unspecified hyperlipidemia type   . Nonintractable headache, unspecified chronicity pattern, unspecified headache type   . Obesity, unspecified classification, unspecified obesity type, unspecified whether serious comorbidity present      -Patient is urged to Call [facility] for counseling. -She is encouraged to stop smoking.  A maintenance inhaler may be added if her breathing does not improve some. -No changes to medication today.   -Patient is to follow-up in 2 months.  She is to contact office sooner as needed for worsening or new symptoms.

## 2019-03-26 ENCOUNTER — Other Ambulatory Visit: Payer: Self-pay | Admitting: Physician Assistant

## 2019-03-30 ENCOUNTER — Telehealth: Payer: Self-pay

## 2019-03-30 NOTE — Telephone Encounter (Signed)
PT said she only tried it one day and it didn't help a lot so she didn't try anymore. I told her that she should try everyday for awhile in addition to the Amitiza bid and see if it helped. She said she will do so and then let us know how it does.

## 2019-03-30 NOTE — Telephone Encounter (Signed)
Pt called and said she started having some more abdominal pain about a week ago. It started on the right side but now is on the left side more in the middle. The pain is intermittent, although it lasts for several hours sometimes.  She has a little nausea, but no vomiting. She is taking the Amitiza 24 mcg bid and still only has a Bm about every 5 days. Erasmo Downer, please advise!

## 2019-03-30 NOTE — Telephone Encounter (Signed)
Agree with recommendations.  

## 2019-03-30 NOTE — Telephone Encounter (Signed)
I had advised she add MiraLAX daily in addition to her Amitiza at last visit. Has she tried this?

## 2019-04-21 NOTE — Progress Notes (Signed)
Referring Provider: Soyla Dryer, PA-C Primary Care Physician:  Soyla Dryer, PA-C Primary GI Physician: Dr. Oneida Alar  Chief Complaint  Patient presents with  . Constipation  . Abdominal Pain    HPI:   Casey Johnston is a 57 y.o. female presenting today for follow-up of constipation. 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 Also with chronic abdominal pain since July 2019. Had reported nausea and early satiety with weight loss earlier this year. CT in October 2019 was without acute findings.  She was started on Amitiza 24 mcg BID, Protonix 40 mg daily, and completed TCS and EGD in June 2020.   Colonoscopy on 10/20/2018: 4 polyps, diverticulosis in the sigmoid colon, tortuous left colon, external and internal hemorrhoids.  Pathology with tubular adenomas.  Recommended repeat colonoscopy in 3 years for surveillance. First-degree relatives need colonoscopy at age 35. EGD on 10/20/2018: Mild gastritis status post biopsy, single duodenal polyp status post resection.  No H. pylori on stomach biopsy.  Duodenal biopsy with fragments of polypoid duodenal mucosa with extensive surface gastric foveolar metaplasia, suggestive of nodular peptic duodenitis.  Also with likely contaminant of colonic mucosa. Suspected abdominal pain most likely due to gastritis and constipation.  Recommended to continue Protonix daily.   She was last seen on 01/21/19 for follow up of constipation.  She was taking Amitiza 24 mcg twice a day but had discontinued MiraLAX.  Only having a bowel movement every every 6 days.  Associated intermittent left-sided abdominal pain that improved with bowel movements. When she was taking MiraLAX, she was having a BM every 3 days.  Historically Linzess caused diarrhea.  Considered retrying Linzess but we did not have samples and patient did not have insurance.  She was advised to add MiraLAX 1 capful daily with 8 ounces of water, daily fiber supplement, and continue Amitiza 24 mcg  twice daily.  Drink plenty of water to keep urine pale yellow to clear.  Call with progress report in 2 weeks.  Patient called on 03/30/2019 reporting abdominal pain and only having a BM every 5 days on Amitiza 24 mcg twice daily.  She had only tried MiraLAX once.  Suspect that abdominal pain was likely related to her underlying constipation.  She was advised to try MiraLAX daily in addition to her Amitiza and to call back to let us know how this does.  Patient has not called with a progress report.  Today she states she continues with abdominal pain. Amitiza 24 mcg BID works sometimes. Having a BM every 5 days. BM is incomplete. Taking MiraLAX daily. Abdominal pain is daily. Starts on right, goes to top, and switches over to left. Abdominal pain improves after a BM. About 5/10. Not as bad as it has been in the past. Comes and goes throughout the day. Not worse with eating. Occasionally sharp, more dull pain. No blood in the stool. No black stools. No nausea or vomiting.  No unintentional weight loss.  No breakthrough GERD symptoms. No dysphagia.   Trying to increase fruit and vegetable intake.   No fever, chills. No lightheaded, dizziness, or pre-syncope. No chest pain, heart palpitations, shortness of breath. Occasional cough. No nasal congestion or sore throat.   Past Medical History:  Diagnosis Date  . Anxiety and depression   . Diabetes mellitus, type II ([place])    Dr. Dorris Fetch managing  . Frequent headaches chronic   ibuprofen helps some  . History of UTI    Recurrent per pt (approx  3 per year)  . Hyperlipidemia   . Hypertension   . Leukocytosis 12/2015   mild lymphocytosis.  Path smear review reassuring.  Repeat 01/2016 stable.  . Morbid obesity ([place])   . OSA on CPAP     Past Surgical History:  Procedure Laterality Date  . ABDOMINAL HYSTERECTOMY  1998   Dysfunctional uterine bleeding.  No hx of abnormal paps.  Marland Kitchen BIOPSY  10/20/2018   Procedure: BIOPSY;  Surgeon: Danie Binder, MD;   Location: [facility];  Service: Endoscopy;;  stomach  . COLONOSCOPY  2012   Dr. Earley Brooke: 1.5 cm pendunculated descending colon polyp (tubulovillous adenoma) and diminutive hyperplastic polyps  . COLONOSCOPY N/A 10/20/2018   Procedure: COLONOSCOPY;  Surgeon: Danie Binder, MD;  Location: [facility];  Service: Endoscopy;  Laterality: N/A;  10:30am  . ESOPHAGOGASTRODUODENOSCOPY N/A 10/20/2018   Procedure: ESOPHAGOGASTRODUODENOSCOPY (EGD);  Surgeon: Danie Binder, MD;  Location: [facility];  Service: Endoscopy;  Laterality: N/A;  . POLYPECTOMY  10/20/2018   Procedure: POLYPECTOMY;  Surgeon: Danie Binder, MD;  Location: [facility];  Service: Endoscopy;;    Current Outpatient Medications  Medication Sig Dispense Refill  . albuterol (VENTOLIN HFA) 108 (90 Base) MCG/ACT inhaler Inhale 2 puffs into the lungs every 6 (six) hours as needed for wheezing or shortness of breath. 18 g 0  . amLODipine (NORVASC) 10 MG tablet TAKE 1 Tablet BY MOUTH ONCE DAILY 90 tablet 0  . atorvastatin (LIPITOR) 20 MG tablet Take 1 tablet (20 mg total) by mouth daily. 90 tablet 2  . insulin glargine (LANTUS) 100 UNIT/ML injection Inject 60 Units into the skin at bedtime.     Marland Kitchen lisinopril (ZESTRIL) 40 MG tablet Take 1 tablet (40 mg total) by mouth daily. 90 tablet 0  . lubiprostone (AMITIZA) 24 MCG capsule 1 PO QHS FOR 7 DAYS THEN 1 PO BID. TAKE WITH FOOD. (Patient taking differently: Take 24 mcg by mouth 2 (two) times daily. 1 PO QHS FOR 7 DAYS THEN 1 PO BID. TAKE WITH FOOD.) 62 capsule 11  . metFORMIN (GLUCOPHAGE) 1000 MG tablet TAKE 1 Tablet  BY MOUTH TWICE DAILY WITH A MEAL 180 tablet 0  . metoprolol tartrate (LOPRESSOR) 100 MG tablet TAKE 1 Tablet  BY MOUTH TWICE DAILY 180 tablet 0  . Omega-3 Fatty Acids (FISH OIL PO) Take 1 capsule by mouth daily.     . pantoprazole (PROTONIX) 40 MG tablet 1 po 30 mins prior to first meal 30 tablet 11   No current facility-administered medications for this visit.      Allergies as of 04/22/2019 - Review Complete 04/22/2019  Allergen Reaction Noted  . Januvia [sitagliptin] Other (See Comments) 05/21/2018    Family History  Problem Relation Age of Onset  . Cancer Mother        ovarian  . COPD Father   . Diabetes Father   . Colon cancer Neg Hx     Social History   Socioeconomic History  . Marital status: Widowed    Spouse name: Not on file  . Number of children: Not on file  . Years of education: Not on file  . Highest education level: Not on file  Occupational History  . Not on file  Social Needs  . Financial resource strain: Not on file  . Food insecurity    Worry: Not on file    Inability: Not on file  . Transportation needs    Medical: Not on file  Non-medical: Not on file  Tobacco Use  . Smoking status: Current Every Day Smoker    Packs/day: 0.25    Years: 20.00    Pack years: 5.00    Types: Cigarettes  . Smokeless tobacco: Never Used  Substance and Sexual Activity  . Alcohol use: No  . Drug use: No  . Sexual activity: Not on file  Lifestyle  . Physical activity    Days per week: Not on file    Minutes per session: Not on file  . Stress: Not on file  Relationships  . Social Herbalist on phone: Not on file    Gets together: Not on file    Attends religious service: Not on file    Active member of club or organization: Not on file    Attends meetings of clubs or organizations: Not on file    Relationship status: Not on file  Other Topics Concern  . Not on file  Social History Narrative   Separated x 20 yrs, one daughter.  She lives with her.  Great niece and nephew live with her as well.   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   Tob: 7 pack year hx, quit 2010.   Alc: none   Exercise: none    Review of Systems: Gen: See HPI CV: See HPI Resp: See HPI GI: See HPI Derm: Denies rash Psych: Denies depression or anxiety.  Heme: See HPI  Physical Exam: BP (!) 154/75   Pulse 70   Temp (!) 96.6 F  (35.9 C) (Temporal)   Ht 5\' 5"  (1.651 m)   Wt 188 lb 3.2 oz (85.4 kg)   BMI 31.32 kg/m  General:   Alert and oriented. No distress noted. Pleasant and cooperative.  Head:  Normocephalic and atraumatic. Eyes:  Conjuctiva clear without scleral icterus. Heart:  S1, S2 present without murmurs appreciated. Lungs:  Clear to auscultation bilaterally. No wheezes, rales, or rhonchi. No distress.  Abdomen:  +BS, soft, non-distended.  Minimal "discomfort" to palpation in the left lower quadrant.  No rebound or guarding. No HSM or masses noted. Msk:  Symmetrical without gross deformities. Normal posture. Extremities:  Without edema. Neurologic:  Alert and  oriented x4 Psych: Normal mood and affect.

## 2019-04-22 ENCOUNTER — Other Ambulatory Visit: Payer: Self-pay

## 2019-04-22 ENCOUNTER — Encounter: Payer: Self-pay | Admitting: Gastroenterology

## 2019-04-22 ENCOUNTER — Ambulatory Visit (INDEPENDENT_AMBULATORY_CARE_PROVIDER_SITE_OTHER): Payer: Self-pay | Admitting: Gastroenterology

## 2019-04-22 VITALS — BP 154/75 | HR 70 | Temp 96.6°F | Ht 65.0 in | Wt 188.2 lb

## 2019-04-22 DIAGNOSIS — K5900 Constipation, unspecified: Secondary | ICD-10-CM

## 2019-04-22 DIAGNOSIS — R109 Unspecified abdominal pain: Secondary | ICD-10-CM

## 2019-04-22 DIAGNOSIS — K219 Gastro-esophageal reflux disease without esophagitis: Secondary | ICD-10-CM

## 2019-04-22 NOTE — Assessment & Plan Note (Addendum)
Patient with chronic abdominal pain associated with constipation.  Historically, Linzess has caused diarrhea and she was switched to Amitiza 24 mcg twice daily.  Initially, Amitiza worked fairly well; however, now with Amitiza 24 mcg twice daily and MiraLAX 1 capful daily and only having a BM every 5 days.  Abdominal pain varies.  May start in the right side, upper abdomen, or on the left side.  Patient states pain is not as bad as it used to be.  About 5/10 in severity, comes and goes throughout the day, not worsened with meals, improves after a good BM.  Denies BRBPR, melena, unintentional weight loss, nausea, vomiting, fever, or chills.  Colonoscopy up-to-date in June 2020 with 4 tubular adenomas, tortuous left colon, and diverticulosis in the sigmoid colon with recommendations to repeat in 3 years.  Abdominal exam today with minimal "discomfort" to palpation in the left lower quadrant.   Suspect abdominal pain is related to constipation that is not adequately managed.  Stop Amitiza and trial Linzess 145 mcg daily on empty stomach.  Samples provided. Patient to call in the next week to let us know if Linzess is working.  We will have to submit for patient assistance as patient is self-pay. Continue MiraLAX 1 capful daily as needed. Add Metamucil or Benefiber daily. Drink enough water to keep urine pale yellow to clear Constipation handout provided. Follow-up in 2 months.  Call if questions or concerns prior.

## 2019-04-22 NOTE — Patient Instructions (Addendum)
We will try Linzess 145 mcg daily to see if this helps with your constipation.  You should stop Amitiza and start Linzess 145 mcg once every morning on empty stomach at least 30 minutes before your first meal.  Please call us in the next week and let us know if Linzess is working well.  If so, we will start patient assistance for this.   You may continue to use MiraLAX 1 capful daily as needed.    Please add Benefiber or Metamucil daily.  These are fiber supplements and will also help with constipation.  Drink plenty of water to keep your urine pale yellow to clear.  See constipation handout below.  We will plan to see you back in 2 months.  Call if you have questions or concerns prior.  [name], PA-C [facility]   Constipation, Adult Constipation is when a person:  Poops (has a bowel movement) fewer times in a week than normal.  Has a hard time pooping.  Has poop that is dry, hard, or bigger than normal. Follow these instructions at home: Eating and drinking   Eat foods that have a lot of fiber, such as: ? Fresh fruits and vegetables. ? Whole grains. ? Beans.  Eat less of foods that are high in fat, low in fiber, or overly processed, such as: ? [place] fries. ? Hamburgers. ? Cookies. ? Candy. ? Soda.  Drink enough fluid to keep your pee (urine) clear or pale yellow. General instructions  Exercise regularly or as told by your doctor.  Go to the restroom when you feel like you need to poop. Do not hold it in.  Take over-the-counter and prescription medicines only as told by your doctor. These include any fiber supplements.  Do pelvic floor retraining exercises, such as: ? Doing deep breathing while relaxing your lower belly (abdomen). ? Relaxing your pelvic floor while pooping.  Watch your condition for any changes.  Keep all follow-up visits as told by your doctor. This is important. Contact a doctor if:  You have pain that gets  worse.  You have a fever.  You have not pooped for 4 days.  You throw up (vomit).  You are not hungry.  You lose weight.  You are bleeding from the anus.  You have thin, pencil-like poop (stool). Get help right away if:  You have a fever, and your symptoms suddenly get worse.  You leak poop or have blood in your poop.  Your belly feels hard or bigger than normal (is bloated).  You have very bad belly pain.  You feel dizzy or you faint. This information is not intended to replace advice given to you by your health care provider. Make sure you discuss any questions you have with your health care provider. Document Released: 10/24/2007 Document Revised: 04/19/2017 Document Reviewed: 10/26/2015 Elsevier Patient Education  2020 [employer].

## 2019-04-22 NOTE — Progress Notes (Signed)
CC'ED TO PCP 

## 2019-04-22 NOTE — Assessment & Plan Note (Signed)
GERD symptoms are well controlled on Protonix 40 mg twice daily.  Continue current medications.

## 2019-04-22 NOTE — Progress Notes (Signed)
cc'ed to pcp °

## 2019-04-22 NOTE — Assessment & Plan Note (Signed)
Chronic constipation.  Symptoms not adequately controlled on Amitiza 24 mcg twice daily and MiraLAX 1 capful daily.  Only having a BM every 5 days.  Associated abdominal pain with constipation that improves after a good bowel movement. No BRBPR, melena, or unintentional weight loss.  Colonoscopy up-to-date in June 2020 with recommendations to repeat in 3 years.  She did have a tortuous left colon.  Stop Amitiza and trial Linzess 145 mcg daily on empty stomach.  Samples provided. Patient to call in the next week to let us know if Linzess is working.  We will have to submit for patient assistance as patient is self-pay. Continue MiraLAX 1 capful daily as needed. Add Metamucil or Benefiber daily. Drink enough water to keep urine pale yellow to clear Constipation handout provided. Follow-up in 2 months.  Call if questions or concerns prior.

## 2019-05-06 ENCOUNTER — Ambulatory Visit: Payer: Self-pay | Admitting: Gastroenterology

## 2019-05-07 ENCOUNTER — Encounter: Payer: Self-pay | Admitting: Physician Assistant

## 2019-05-11 ENCOUNTER — Telehealth: Payer: Self-pay | Admitting: General Practice

## 2019-05-11 NOTE — Telephone Encounter (Signed)
Patient called in stating the samples of Linzess is working and she would like a Rx sent in.  [facility].

## 2019-05-11 NOTE — Telephone Encounter (Signed)
We will need to submit for patient assistance for Linzess 145 mcg daily. In the mean time, if she is out of samples, she could come by and pick some up.

## 2019-05-12 NOTE — Telephone Encounter (Signed)
Patient notified and stated she has enough samples.

## 2019-05-12 NOTE — Telephone Encounter (Signed)
I tried to call the patient and no answer, lmom  Casey Johnston please submit Linzess application through the Health Department

## 2019-05-18 MED ORDER — LINACLOTIDE 145 MCG PO CAPS: 145.0000 ug | ORAL_CAPSULE | Freq: Every day | ORAL | 3 refills | Status: DC

## 2019-05-18 NOTE — Addendum Note (Signed)
Addended by: Claudina Lick on: 05/18/2019 08:48 AM   Modules accepted: Orders

## 2019-05-18 NOTE — Telephone Encounter (Signed)
Patient assistance form faxed to the Health Department

## 2019-05-25 ENCOUNTER — Telehealth: Payer: Self-pay | Admitting: Student

## 2019-05-25 ENCOUNTER — Other Ambulatory Visit: Payer: Self-pay | Admitting: Physician Assistant

## 2019-05-25 MED ORDER — MICONAZOLE NITRATE 2 % VA CREA: 1.0000 | TOPICAL_CREAM | Freq: Every day | VAGINAL | 0 refills | Status: DC

## 2019-05-25 NOTE — Telephone Encounter (Signed)
Pt called and left vm on nurse's line on wed. May 20, 2019 requesting diflucan to be sent to [facility] in [place] for yeast infection.   LPN was out of the office for holiday beginning Dec. 30, therefore pt's call was returned on 05-25-19 at [facility] return.  LPN called pt back on 05-25-19 and spoke with pt. Pt c/o of vaginal discharge and itching with no odor. Pt states she has not tried anything OTC and last time she tried OTC med was "years ago and did not help at all".  PA rx monistat 7 to [facility] in [place] and pt was notified. Pt verbalized understanding.

## 2019-05-27 ENCOUNTER — Ambulatory Visit: Payer: Self-pay | Admitting: Physician Assistant

## 2019-05-27 ENCOUNTER — Other Ambulatory Visit (HOSPITAL_COMMUNITY)
Admission: RE | Admit: 2019-05-27 | Discharge: 2019-05-27 | Disposition: A | Discharge status: Home / Self Care | Payer: Self-pay | Source: Ambulatory Visit | Attending: Physician Assistant | Admitting: Physician Assistant

## 2019-05-27 ENCOUNTER — Other Ambulatory Visit: Payer: Self-pay

## 2019-05-27 DIAGNOSIS — E785 Hyperlipidemia, unspecified: Secondary | ICD-10-CM | POA: Insufficient documentation

## 2019-05-27 DIAGNOSIS — I10 Essential (primary) hypertension: Secondary | ICD-10-CM | POA: Insufficient documentation

## 2019-05-27 DIAGNOSIS — E119 Type 2 diabetes mellitus without complications: Secondary | ICD-10-CM | POA: Insufficient documentation

## 2019-05-27 LAB — COMPREHENSIVE METABOLIC PANEL
ALT: 17 U/L (ref 0–44)
AST: 18 U/L (ref 15–41)
Albumin: 4 g/dL (ref 3.5–5.0)
Alkaline Phosphatase: 99 U/L (ref 38–126)
Anion gap: 10 (ref 5–15)
BUN: 15 mg/dL (ref 6–20)
CO2: 27 mmol/L (ref 22–32)
Calcium: 9.3 mg/dL (ref 8.9–10.3)
Chloride: 98 mmol/L (ref 98–111)
Creatinine, Ser: 0.6 mg/dL (ref 0.44–1.00)
GFR calc Af Amer: >60 mL/min (ref >60)
GFR calc non Af Amer: >60 mL/min (ref >60)
Glucose, Bld: 238 mg/dL — ABNORMAL HIGH (ref 70–99)
Potassium: 3.7 mmol/L (ref 3.5–5.1)
Sodium: 135 mmol/L (ref 135–145)
Total Bilirubin: 0.5 mg/dL (ref 0.3–1.2)
Total Protein: 7.9 g/dL (ref 6.5–8.1)

## 2019-05-27 LAB — LIPID PANEL
Cholesterol: 187 mg/dL (ref 0–200)
HDL: 48 mg/dL (ref >40)
LDL Cholesterol: 88 mg/dL (ref 0–99)
Total CHOL/HDL Ratio: 3.9 RATIO
Triglycerides: 256 mg/dL — ABNORMAL HIGH (ref <150)
VLDL: 51 mg/dL — ABNORMAL HIGH (ref 0–40)

## 2019-05-27 LAB — HEMOGLOBIN A1C
Hgb A1c MFr Bld: 10.5 % — ABNORMAL HIGH (ref 4.8–5.6)
Mean Plasma Glucose: 254.65 mg/dL

## 2019-05-28 LAB — MICROALBUMIN, URINE: Microalb, Ur: 135.8 ug/mL — ABNORMAL HIGH

## 2019-06-01 ENCOUNTER — Other Ambulatory Visit: Payer: Self-pay | Admitting: Physician Assistant

## 2019-06-03 ENCOUNTER — Other Ambulatory Visit: Payer: Self-pay | Admitting: Nurse Practitioner

## 2019-06-04 ENCOUNTER — Ambulatory Visit: Payer: HRSA Program | Admitting: Physician Assistant

## 2019-06-04 ENCOUNTER — Encounter: Payer: Self-pay | Admitting: Physician Assistant

## 2019-06-04 VITALS — BP 169/82

## 2019-06-04 DIAGNOSIS — E1165 Type 2 diabetes mellitus with hyperglycemia: Secondary | ICD-10-CM

## 2019-06-04 DIAGNOSIS — F17200 Nicotine dependence, unspecified, uncomplicated: Secondary | ICD-10-CM

## 2019-06-04 DIAGNOSIS — E785 Hyperlipidemia, unspecified: Secondary | ICD-10-CM

## 2019-06-04 DIAGNOSIS — I10 Essential (primary) hypertension: Secondary | ICD-10-CM

## 2019-06-04 MED ORDER — CLONIDINE HCL 0.1 MG PO TABS: 0.1000 mg | ORAL_TABLET | Freq: Two times a day (BID) | ORAL | 1 refills | Status: DC

## 2019-06-04 NOTE — Progress Notes (Signed)
BP (!) 169/82    Subjective:    Patient ID: Casey Johnston, female    DOB: 04/12/1961, 59 y.o.   MRN: [id number]  HPI: Casey Johnston is a 59 y.o. female presenting on 06/04/2019 for No chief complaint on file.   HPI   This is a telemedicine appointment through [employer] due to coronavirus pandemic  I connected with  [place] on 06/04/19 by a video enabled telemedicine application and verified that I am speaking with the correct person using two identifiers.   I discussed the limitations of evaluation and management by telemedicine. The patient expressed understanding and agreed to proceed.  Pt is at home.  Provider is at office.      Pt is 59yo F with DM.  Pt says she is doing fine.  She sees GI for chronic constipation.  She has no new complaints today.      Relevant past medical, surgical, family and social history reviewed and updated as indicated. Interim medical history since our last visit reviewed. Allergies and medications reviewed and updated.   Current Outpatient Medications:  .  amLODipine (NORVASC) 10 MG tablet, TAKE 1 Tablet BY MOUTH ONCE DAILY, Disp: 90 tablet, Rfl: 0 .  atorvastatin (LIPITOR) 20 MG tablet, Take 1 tablet (20 mg total) by mouth daily., Disp: 90 tablet, Rfl: 2 .  insulin glargine (LANTUS) 100 UNIT/ML injection, Inject 60 Units into the skin at bedtime. , Disp: , Rfl:  .  lisinopril (ZESTRIL) 40 MG tablet, Take 1 tablet (40 mg total) by mouth daily., Disp: 90 tablet, Rfl: 0 .  lubiprostone (AMITIZA) 24 MCG capsule, 1 PO QHS FOR 7 DAYS THEN 1 PO BID. TAKE WITH FOOD. (Patient taking differently: Take 24 mcg by mouth 2 (two) times daily. 1 PO QHS FOR 7 DAYS THEN 1 PO BID. TAKE WITH FOOD.), Disp: 62 capsule, Rfl: 11 .  metFORMIN (GLUCOPHAGE) 1000 MG tablet, TAKE 1 Tablet  BY MOUTH TWICE DAILY WITH A MEAL, Disp: 180 tablet, Rfl: 0 .  metoprolol tartrate (LOPRESSOR) 100 MG tablet, TAKE 1 Tablet  BY MOUTH TWICE DAILY, Disp: 180 tablet, Rfl:  0 .  Omega-3 Fatty Acids (FISH OIL PO), Take 1 capsule by mouth daily. , Disp: , Rfl:  .  pantoprazole (PROTONIX) 40 MG tablet, 1 po 30 mins prior to first meal, Disp: 30 tablet, Rfl: 11 .  polyethylene glycol powder (MIRALAX) 17 GM/SCOOP powder, Take 1 Container by mouth once., Disp: , Rfl:  .  PROVENTIL HFA 108 (90 Base) MCG/ACT inhaler, INHALE 2 PUFFS BY MOUTH EVERY 6 HOURS AS NEEDED FOR COUGHING, WHEEZING, OR SHORTNESS OF BREATH, Disp: 20.1 g, Rfl: 0 .  linaclotide (LINZESS) 145 MCG CAPS capsule, Take 1 capsule (145 mcg total) by mouth daily before breakfast. (Patient not taking: Reported on 06/04/2019), Disp: 90 capsule, Rfl: 3    Review of Systems  Per HPI unless specifically indicated above     Objective:    BP (!) 169/82   Wt Readings from Last 3 Encounters:  04/22/19 188 lb 3.2 oz (85.4 kg)  02/18/19 185 lb 3.2 oz (84 kg)  02/06/19 185 lb (83.9 kg)    Physical Exam Constitutional:      General: She is not in acute distress.    Appearance: She is not ill-appearing.  HENT:     Head: Normocephalic and atraumatic.  Pulmonary:     Effort: Pulmonary effort is normal. No respiratory distress.  Neurological:     Mental Status:  She is alert and oriented to person, place, and time.  Psychiatric:        Attention and Perception: Attention normal.        Mood and Affect: Mood normal.        Speech: Speech normal.        Behavior: Behavior is cooperative.     Results for orders placed or performed during the hospital encounter of 05/27/19  Microalbumin, urine  Result Value Ref Range   Microalb, Ur 135.8 (H) Not Estab. ug/mL  Hemoglobin A1c  Result Value Ref Range   Hgb A1c MFr Bld 10.5 (H) 4.8 - 5.6 %   Mean Plasma Glucose 254.65 mg/dL  Lipid panel  Result Value Ref Range   Cholesterol 187 0 - 200 mg/dL   Triglycerides 256 (H) <150 mg/dL   HDL 48 >40 mg/dL   Total CHOL/HDL Ratio 3.9 RATIO   VLDL 51 (H) 0 - 40 mg/dL   LDL Cholesterol 88 0 - 99 mg/dL  Comprehensive  metabolic panel  Result Value Ref Range   Sodium 135 135 - 145 mmol/L   Potassium 3.7 3.5 - 5.1 mmol/L   Chloride 98 98 - 111 mmol/L   CO2 27 22 - 32 mmol/L   Glucose, Bld 238 (H) 70 - 99 mg/dL   BUN 15 6 - 20 mg/dL   Creatinine, Ser 0.60 0.44 - 1.00 mg/dL   Calcium 9.3 8.9 - 10.3 mg/dL   Total Protein 7.9 6.5 - 8.1 g/dL   Albumin 4.0 3.5 - 5.0 g/dL   AST 18 15 - 41 U/L   ALT 17 0 - 44 U/L   Alkaline Phosphatase 99 38 - 126 U/L   Total Bilirubin 0.5 0.3 - 1.2 mg/dL   GFR calc non Af Amer >60 >60 mL/min   GFR calc Af Amer >60 >60 mL/min   Anion gap 10 5 - 15      Assessment & Plan:   Encounter Diagnoses  Name Primary?  . Essential hypertension, benign Yes  . Uncontrolled type 2 diabetes mellitus with hyperglycemia ([place])   . Hyperlipidemia, unspecified hyperlipidemia type   . Tobacco use disorder      -reviewed labs with pt  -Add clonidine for uncontrolled BP.  Pt is to continue to monitor the bp at home.  Counseled to limit sodium intake. -discussed with pt increases in a1c.  Counseled her to watch dm diet and exercise regularly.  She is to monitor her bs.   -pt to continue other medications -follow up 1 mont to recheck blood pressure.  She is to contact office sooner prn

## 2019-06-23 NOTE — Progress Notes (Signed)
Referring Provider: Soyla Dryer, PA-C Primary Care Physician:  Soyla Dryer, PA-C Primary GI Physician: Dr. Oneida Alar  Chief Complaint  Patient presents with  . Abdominal Pain    occ, right middle    HPI:   Casey Johnston is a 60 y.o. female presenting today for follow-up. History of GERD, constipation, chronic abdominal pain that seems to be associated with underlying constipation. TCS and EGD in 2020. TCS with 4 tubular adenomas, diverticulosis in the sigmoid colon, tortuous left colon, external and internal hemorrhoids. Due for repeat in 2023. EGD with gastritis and duodenitis. No H. Pylori.   Last seen in our office on 04/22/19 for follow-up of abdominal pain, constipation, and GERD.  GERD symptoms well controlled on Protonix twice daily.  Constipation not adequately controlled with Amitiza 24 mcg twice daily and MiraLAX 1 capful daily.  Only having a BM every 5 days.  Associated abdominal pain with constipation that improves after good BM.  No alarm symptoms.  Has shortly Linzess caused diarrhea.  Plans to try Linzess 145 mcg daily (samples provided) and continue MiraLAX 1 capful daily as needed.  Add Benefiber or Metamucil.  She was to call in 1 week with a progress report.  Patient called on 05/11/2019 reporting samples of Linzess were working and would like a prescription.  Patient assistance form was faxed to the health department.   Today:  Constipation: Not currently on Linzess. Took paperwork over to health department and hasn't heard back. Taking Amitiza 24 mcg BID. Taking MiraLAX once daily. Having a BM every 5 days. Stools are hard majority of the time. BMs not complete. When taking Linzess 145 mcg samples, she was having a BM daily. No blood in the stool or black stools. Feels like she has excess gas. Eats broccoli, onions, brussle sprouts. Doesn't typically cause any pain.    Abdominal Pain: Overall, feels abdominal pain is improved. Occasionally occurs on the  right side of her abdomen.  Mid to upper. Seems the pain comes on after not having a BM for several days. Doesn't last long. Maybe 15 minutes. Not severe but knows it is there. Doesn't feel the pain for several days after she has a good BM. No postprandial abdominal pain.   GERD: Only taking Protonix once a day. This is working well. No breakthrough symptoms. No nausea or vomiting. No dysphagia.   She has gained some weight. Appetite is much better.   CT in May 2020 with 1.2 cm subcutaneous nodule in the right low anterior abdominal wall : Patient reports no one has mentioned this to her.  She has no significant pain in the right lower quadrant.  She has not noticed any nodules.   Past Medical History:  Diagnosis Date  . Anxiety and depression   . Diabetes mellitus, type II ([place])    Dr. Dorris Fetch managing  . Frequent headaches chronic   ibuprofen helps some  . History of UTI    Recurrent per pt (approx 3 per year)  . Hyperlipidemia   . Hypertension   . Leukocytosis 12/2015   mild lymphocytosis.  Path smear review reassuring.  Repeat 01/2016 stable.  . Morbid obesity ([place])   . OSA on CPAP     Past Surgical History:  Procedure Laterality Date  . ABDOMINAL HYSTERECTOMY  1998   Dysfunctional uterine bleeding.  No hx of abnormal paps.  Marland Kitchen BIOPSY  10/20/2018   Procedure: BIOPSY;  Surgeon: Danie Binder, MD;  Location: [facility];  Service: Endoscopy;;  stomach  . COLONOSCOPY  2012   Dr. Earley Brooke: 1.5 cm pendunculated descending colon polyp (tubulovillous adenoma) and diminutive hyperplastic polyps  . COLONOSCOPY N/A 10/20/2018   Procedure: COLONOSCOPY;  Surgeon: Danie Binder, MD;  Location: [facility];  Service: Endoscopy;  Laterality: N/A;  10:30am  . ESOPHAGOGASTRODUODENOSCOPY N/A 10/20/2018   Procedure: ESOPHAGOGASTRODUODENOSCOPY (EGD);  Surgeon: Danie Binder, MD;  Location: [facility];  Service: Endoscopy;  Laterality: N/A;  . POLYPECTOMY  10/20/2018   Procedure: POLYPECTOMY;   Surgeon: Danie Binder, MD;  Location: [facility];  Service: Endoscopy;;    Current Outpatient Medications  Medication Sig Dispense Refill  . amLODipine (NORVASC) 10 MG tablet TAKE 1 Tablet BY MOUTH ONCE DAILY 90 tablet 0  . atorvastatin (LIPITOR) 20 MG tablet Take 1 tablet (20 mg total) by mouth daily. 90 tablet 2  . cloNIDine (CATAPRES) 0.1 MG tablet Take 1 tablet (0.1 mg total) by mouth 2 (two) times daily. 180 tablet 1  . insulin glargine (LANTUS) 100 UNIT/ML injection Inject 60 Units into the skin at bedtime.     Marland Kitchen lisinopril (ZESTRIL) 40 MG tablet Take 1 tablet (40 mg total) by mouth daily. 90 tablet 0  . lubiprostone (AMITIZA) 24 MCG capsule 1 PO QHS FOR 7 DAYS THEN 1 PO BID. TAKE WITH FOOD. (Patient taking differently: Take 24 mcg by mouth 2 (two) times daily. 1 PO QHS FOR 7 DAYS THEN 1 PO BID. TAKE WITH FOOD.) 62 capsule 11  . metFORMIN (GLUCOPHAGE) 1000 MG tablet TAKE 1 Tablet  BY MOUTH TWICE DAILY WITH A MEAL 180 tablet 0  . metoprolol tartrate (LOPRESSOR) 100 MG tablet TAKE 1 Tablet  BY MOUTH TWICE DAILY 180 tablet 0  . Omega-3 Fatty Acids (FISH OIL PO) Take 1 capsule by mouth daily.     . pantoprazole (PROTONIX) 40 MG tablet TAKE 1 TABLET BY MOUTH TWICE DAILY BEFORE A MEAL 60 tablet 3  . polyethylene glycol powder (MIRALAX) 17 GM/SCOOP powder Take 1 Container by mouth daily.     Marland Kitchen PROVENTIL HFA 108 (90 Base) MCG/ACT inhaler INHALE 2 PUFFS BY MOUTH EVERY 6 HOURS AS NEEDED FOR COUGHING, WHEEZING, OR SHORTNESS OF BREATH 20.1 g 0  . linaclotide (LINZESS) 145 MCG CAPS capsule Take 1 capsule (145 mcg total) by mouth daily before breakfast. (Patient not taking: Reported on 06/04/2019) 90 capsule 3   No current facility-administered medications for this visit.    Allergies as of 06/24/2019 - Review Complete 06/24/2019  Allergen Reaction Noted  . Januvia [sitagliptin] Other (See Comments) 05/21/2018    Family History  Problem Relation Age of Onset  . Cancer Mother        ovarian   . COPD Father   . Diabetes Father   . Colon cancer Neg Hx     Social History   Socioeconomic History  . Marital status: Widowed    Spouse name: Not on file  . Number of children: Not on file  . Years of education: Not on file  . Highest education level: Not on file  Occupational History  . Not on file  Tobacco Use  . Smoking status: Current Every Day Smoker    Packs/day: 0.25    Years: 20.00    Pack years: 5.00    Types: Cigarettes  . Smokeless tobacco: Never Used  Substance and Sexual Activity  . Alcohol use: No  . Drug use: No  . Sexual activity: Not on file  Other  Topics Concern  . Not on file  Social History Narrative   Separated x 20 yrs, one daughter.  She lives with her.  Great niece and nephew live with her as well.   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   Tob: 7 pack year hx, quit 2010.   Alc: none   Exercise: none   Social Determinants of Health   Financial Resource Strain:   . Difficulty of Paying Living Expenses: Not on file  Food Insecurity:   . Worried About Charity fundraiser in the Last Year: Not on file  . Ran Out of Food in the Last Year: Not on file  Transportation Needs:   . Lack of Transportation (Medical): Not on file  . Lack of Transportation (Non-Medical): Not on file  Physical Activity:   . Days of Exercise per Week: Not on file  . Minutes of Exercise per Session: Not on file  Stress:   . Feeling of Stress : Not on file  Social Connections:   . Frequency of Communication with Friends and Family: Not on file  . Frequency of Social Gatherings with Friends and Family: Not on file  . Attends Religious Services: Not on file  . Active Member of Clubs or Organizations: Not on file  . Attends Archivist Meetings: Not on file  . Marital Status: Not on file    Review of Systems: Gen: Denies fever, chills, lightheadedness, dizziness, presyncope, or syncope. CV: Denies chest pain or palpitations Resp: Denies shortness of  breath. Admits to daily dry cough. Stopped lisinopril which has improved the cough.  GI: See HPI Derm: Denies rash Psych: Denies depression or anxiety Heme: Denies bruising or bleeding  Physical Exam: BP (!) 146/74   Pulse 82   Temp (!) 97.1 F (36.2 C) (Temporal)   Ht 5\' 5"  (1.651 m)   Wt 197 lb 3.2 oz (89.4 kg)   BMI 32.82 kg/m  General:   Alert and oriented. No distress noted. Pleasant and cooperative.  Head:  Normocephalic and atraumatic. Eyes:  Conjuctiva clear without scleral icterus. Heart:  S1, S2 present without murmurs appreciated. Lungs:  Clear to auscultation bilaterally. No wheezes, rales, or rhonchi. No distress.  Abdomen:  +BS, soft, non-tender and non-distended. No rebound or guarding. No HSM or masses noted. Msk:  Symmetrical without gross deformities. Normal posture. Extremities:  Without edema. Neurologic:  Alert and  oriented x4 Psych: Normal mood and affect.

## 2019-06-24 ENCOUNTER — Other Ambulatory Visit: Payer: Self-pay

## 2019-06-24 ENCOUNTER — Encounter: Payer: Self-pay | Admitting: Gastroenterology

## 2019-06-24 ENCOUNTER — Ambulatory Visit (INDEPENDENT_AMBULATORY_CARE_PROVIDER_SITE_OTHER): Payer: Self-pay | Admitting: Gastroenterology

## 2019-06-24 VITALS — BP 146/74 | HR 82 | Temp 97.1°F | Ht 65.0 in | Wt 197.2 lb

## 2019-06-24 DIAGNOSIS — R109 Unspecified abdominal pain: Secondary | ICD-10-CM

## 2019-06-24 DIAGNOSIS — K219 Gastro-esophageal reflux disease without esophagitis: Secondary | ICD-10-CM

## 2019-06-24 DIAGNOSIS — R935 Abnormal findings on diagnostic imaging of other abdominal regions, including retroperitoneum: Secondary | ICD-10-CM

## 2019-06-24 DIAGNOSIS — K5900 Constipation, unspecified: Secondary | ICD-10-CM

## 2019-06-24 NOTE — Patient Instructions (Addendum)
I am referring you to interventional radiology to have the nodule identified on previous CT biopsy.  We will help get this arranged.  Please reach out to the health department to follow-up on getting your Linzess.  In the meantime, continue Amitiza 24 mcg twice daily with food.  Increase MiraLAX to 1 capful twice daily in 8 ounces of water.  Be sure you are drinking plenty of water to keep your urine pale yellow to clear.  Try adding Benefiber or Metamucil daily.  These are fiber supplements and also help with constipation.  Avoid gas producing foods including broccoli, cabbage, cauliflower, and baked beans  Continue taking Protonix 40 mg daily 30 minutes before breakfast.  We will plan to follow-up with you in 6 months.  Call if you have questions or concerns prior.  [name], PA-C [facility]  Constipation, Adult Constipation is when a person:  Poops (has a bowel movement) fewer times in a week than normal.  Has a hard time pooping.  Has poop that is dry, hard, or bigger than normal. Follow these instructions at home: Eating and drinking   Eat foods that have a lot of fiber, such as: ? Fresh fruits and vegetables. ? Whole grains. ? Beans.  Eat less of foods that are high in fat, low in fiber, or overly processed, such as: ? [place] fries. ? Hamburgers. ? Cookies. ? Candy. ? Soda.  Drink enough fluid to keep your pee (urine) clear or pale yellow. General instructions  Exercise regularly or as told by your doctor.  Go to the restroom when you feel like you need to poop. Do not hold it in.  Take over-the-counter and prescription medicines only as told by your doctor. These include any fiber supplements.  Do pelvic floor retraining exercises, such as: ? Doing deep breathing while relaxing your lower belly (abdomen). ? Relaxing your pelvic floor while pooping.  Watch your condition for any changes.  Keep all follow-up visits as told by  your doctor. This is important. Contact a doctor if:  You have pain that gets worse.  You have a fever.  You have not pooped for 4 days.  You throw up (vomit).  You are not hungry.  You lose weight.  You are bleeding from the anus.  You have thin, pencil-like poop (stool). Get help right away if:  You have a fever, and your symptoms suddenly get worse.  You leak poop or have blood in your poop.  Your belly feels hard or bigger than normal (is bloated).  You have very bad belly pain.  You feel dizzy or you faint. This information is not intended to replace advice given to you by your health care provider. Make sure you discuss any questions you have with your health care provider. Document Revised: 04/19/2017 Document Reviewed: 10/26/2015 Elsevier Patient Education  [place].  Abdominal Bloating When you have abdominal bloating, your abdomen may feel full, tight, or painful. It may also look bigger than normal or swollen (distended). Common causes of abdominal bloating include:  Swallowing air.  Constipation.  Problems digesting food.  Eating too much.  Irritable bowel syndrome. This is a condition that affects the large intestine.  Lactose intolerance. This is an inability to digest lactose, a natural sugar in dairy products.  Celiac disease. This is a condition that affects the ability to digest gluten, a protein found in some grains.  Gastroparesis. This is a condition that slows down the movement of food in  the stomach and small intestine. It is more common in people with diabetes mellitus.  Gastroesophageal reflux disease (GERD). This is a digestive condition that makes stomach acid flow back into the esophagus.  Urinary retention. This means that the body is holding onto urine, and the bladder cannot be emptied all the way. Follow these instructions at home: Eating and drinking  Avoid eating too much.  Try not to swallow air while talking or  eating.  Avoid eating while lying down.  Avoid these foods and drinks: ? Foods that cause gas, such as broccoli, cabbage, cauliflower, and baked beans. ? Carbonated drinks. ? Hard candy. ? Chewing gum. Medicines  Take over-the-counter and prescription medicines only as told by your health care provider.  Take probiotic medicines. These medicines contain live bacteria or yeasts that can help digestion.  Take coated peppermint oil capsules. Activity  Try to exercise regularly. Exercise may help to relieve bloating that is caused by gas and relieve constipation. General instructions  Keep all follow-up visits as told by your health care provider. This is important. Contact a health care provider if:  You have nausea and vomiting.  You have diarrhea.  You have abdominal pain.  You have unusual weight loss or weight gain.  You have severe pain, and medicines do not help. Get help right away if:  You have severe chest pain.  You have trouble breathing.  You have shortness of breath.  You have trouble urinating.  You have darker urine than normal.  You have blood in your stools or have dark, tarry stools. Summary  Abdominal bloating means that the abdomen is swollen.  Common causes of abdominal bloating are swallowing air, constipation, and problems digesting food.  Avoid eating too much and avoid swallowing air.  Avoid foods that cause gas, carbonated drinks, hard candy, and chewing gum. This information is not intended to replace advice given to you by your health care provider. Make sure you discuss any questions you have with your health care provider. Document Revised: 08/25/2018 Document Reviewed: 06/08/2016 Elsevier Patient Education  [place].

## 2019-06-24 NOTE — Progress Notes (Signed)
Cc'ed to pcp °

## 2019-06-24 NOTE — Assessment & Plan Note (Addendum)
Chronic.  Not adequately controlled with Amitiza twice daily and MiraLAX once daily.  She was provided samples of Linzess 145 mcg at last visit which she reports worked very well.  Prescription was sent to health department for patient assistance. Patient reports filling out paperwork but has not heard back.  Currently having 1 BM every 5 days that is hard and incomplete.  Associated intermittent right-sided abdominal pain after several days of no BM. Also with increased flatus which is like related to underlying constipation as well as dietary influences. No alarm symptoms.  Colonoscopy up-to-date in 2020.  She is due for repeat in 2023.  Patient advised to call the health department to follow-up on Linzess 145 mcg.  Also requested nursing staff to follow-up on this.  Unfortunately, we do not have any samples at this time. Continue Amitiza 24 mcg twice daily with food for now. Increase MiraLAX to 1 capful in 8 ounces of water twice daily. Drink enough water to keep urine pale yellow to clear. Add Benefiber or Metamucil daily. Avoid gas producing foods including broccoli, cabbage, cauliflower, and baked beans.  Handout provided. Constipation handout provided. Follow-up in 6 months.

## 2019-06-24 NOTE — Assessment & Plan Note (Signed)
CT on 10/06/2018 at the time of an ED visit for perirectal abscess revealed 1.2 cm subcutaneous nodule in the right low anterior abdominal wall.  This was increased in size compared to 0.9 cm on CT in October 2019.  Also with a cavitary component and could represent infectious or malignant etiology.  Recommended to consider tissue sampling as clinically indicated given its persistence across prior CTs. No appreciable nodule on exam today. No abdominal tenderness to palpation. Patient does report intermittent right sided abdominal pain but this seem to be closely associated with constipation. Doubt this is related to the nodule. Unintentional weight loss in early 2020 but has recently been gaining weight.   Will go ahead and place referral to IR to have this evaluated further.

## 2019-06-24 NOTE — Assessment & Plan Note (Signed)
GERD symptoms are well controlled on Protonix once daily.  No breakthrough symptoms.  No alarm symptoms.  Continue Protonix 40 mg daily 30 minutes before breakfast. Follow-up in 6 months.

## 2019-06-24 NOTE — Assessment & Plan Note (Addendum)
Chronic abdominal pain that seems to be closely associated with constipation.  Overall, she feels her abdominal pain is improving.  Typically on the right side and comes on after several days of no BM.  Mild.  Lasting about 15 minutes. Constipation not adequately managed as discussed above.  Interestingly, upon review of chart, she had a CT in the emergency department in May 2020 when she had a perirectal abscess.  This revealed a 1.2 cm subcutaneous nodule in the right low anterior abdominal wall.  This was increased in size compared to 0.9 cm on CT in October 2019.  Also with a cavitary component and could represent infectious or malignant etiology.  Recommended to consider tissue sampling as clinically indicated given its persistence across prior CTs. no nodule appreciated on exam.  No abdominal tenderness to palpation.  I doubt her abdominal pain is related to this nodule; however, this does need to be evaluated further.  Hopefully with better management of constipation as per above, her intermittent abdominal pain will continue to improve. We will refer her to IR for further evaluation of the abdominal nodule. Follow-up in 6 months.  Call if questions or concerns prior.

## 2019-07-03 ENCOUNTER — Encounter (INDEPENDENT_AMBULATORY_CARE_PROVIDER_SITE_OTHER): Payer: Self-pay | Admitting: Ophthalmology

## 2019-07-06 ENCOUNTER — Ambulatory Visit: Payer: HRSA Program | Admitting: Physician Assistant

## 2019-07-06 ENCOUNTER — Encounter: Payer: Self-pay | Admitting: Physician Assistant

## 2019-07-06 ENCOUNTER — Other Ambulatory Visit: Payer: Self-pay | Admitting: Physician Assistant

## 2019-07-06 VITALS — BP 145/76

## 2019-07-06 DIAGNOSIS — F17200 Nicotine dependence, unspecified, uncomplicated: Secondary | ICD-10-CM

## 2019-07-06 DIAGNOSIS — E785 Hyperlipidemia, unspecified: Secondary | ICD-10-CM

## 2019-07-06 DIAGNOSIS — I10 Essential (primary) hypertension: Secondary | ICD-10-CM

## 2019-07-06 DIAGNOSIS — E1165 Type 2 diabetes mellitus with hyperglycemia: Secondary | ICD-10-CM

## 2019-07-06 DIAGNOSIS — Z1239 Encounter for other screening for malignant neoplasm of breast: Secondary | ICD-10-CM

## 2019-07-06 MED ORDER — LOSARTAN POTASSIUM 100 MG PO TABS: 100.0000 mg | ORAL_TABLET | Freq: Every day | ORAL | 1 refills | Status: DC

## 2019-07-06 NOTE — Progress Notes (Signed)
BP (!) 145/76    Subjective:    Patient ID: Casey Johnston, female    DOB: October 27, 1960, 59 y.o.   MRN: [id number]  HPI: Casey Johnston is a 59 y.o. female presenting on 07/06/2019 for No chief complaint on file.   HPI   This is a telemedicine appointment through [employer] due to coronavirus pandemic.   I connected with  [name] on 07/06/19 by a video enabled telemedicine application and verified that I am speaking with the correct person using two identifiers.   I discussed the limitations of evaluation and management by telemedicine. The patient expressed understanding and agreed to proceed.  Pt is at home.  Provider is at office.    Pt is 70yoF with uncontrolled DM, htn, dyslipidemia.    She says her Cough is gone since stopping lisinopril  She is not working but has some good prospects  She wears a mask when she goes out.  She says she is Eating more and exercising less    Relevant past medical, surgical, family and social history reviewed and updated as indicated. Interim medical history since our last visit reviewed. Allergies and medications reviewed and updated.   Current Outpatient Medications:  .  amLODipine (NORVASC) 10 MG tablet, TAKE 1 Tablet BY MOUTH ONCE DAILY, Disp: 90 tablet, Rfl: 0 .  atorvastatin (LIPITOR) 20 MG tablet, Take 1 tablet (20 mg total) by mouth daily., Disp: 90 tablet, Rfl: 2 .  cloNIDine (CATAPRES) 0.1 MG tablet, Take 1 tablet (0.1 mg total) by mouth 2 (two) times daily., Disp: 180 tablet, Rfl: 1 .  insulin glargine (LANTUS) 100 UNIT/ML injection, Inject 60 Units into the skin at bedtime. , Disp: , Rfl:  .  lubiprostone (AMITIZA) 24 MCG capsule, 1 PO QHS FOR 7 DAYS THEN 1 PO BID. TAKE WITH FOOD. (Patient taking differently: Take 24 mcg by mouth 2 (two) times daily. 1 PO QHS FOR 7 DAYS THEN 1 PO BID. TAKE WITH FOOD.), Disp: 62 capsule, Rfl: 11 .  metFORMIN (GLUCOPHAGE) 1000 MG tablet, TAKE 1 Tablet  BY MOUTH TWICE DAILY WITH A  MEAL, Disp: 180 tablet, Rfl: 0 .  metoprolol tartrate (LOPRESSOR) 100 MG tablet, TAKE 1 Tablet  BY MOUTH TWICE DAILY, Disp: 180 tablet, Rfl: 0 .  Omega-3 Fatty Acids (FISH OIL PO), Take 1 capsule by mouth daily. , Disp: , Rfl:  .  pantoprazole (PROTONIX) 40 MG tablet, TAKE 1 TABLET BY MOUTH TWICE DAILY BEFORE A MEAL, Disp: 60 tablet, Rfl: 3 .  polyethylene glycol powder (MIRALAX) 17 GM/SCOOP powder, Take 1 Container by mouth daily. , Disp: , Rfl:  .  PROVENTIL HFA 108 (90 Base) MCG/ACT inhaler, INHALE 2 PUFFS BY MOUTH EVERY 6 HOURS AS NEEDED FOR COUGHING, WHEEZING, OR SHORTNESS OF BREATH, Disp: 20.1 g, Rfl: 0 .  lisinopril (ZESTRIL) 40 MG tablet, Take 1 tablet (40 mg total) by mouth daily. (Patient not taking: Reported on 06/24/2019), Disp: 90 tablet, Rfl: 0   Review of Systems  Per HPI unless specifically indicated above     Objective:    BP (!) 145/76   Wt Readings from Last 3 Encounters:  06/24/19 197 lb 3.2 oz (89.4 kg)  04/22/19 188 lb 3.2 oz (85.4 kg)  02/18/19 185 lb 3.2 oz (84 kg)    Physical Exam Constitutional:      General: She is not in acute distress.    Appearance: Normal appearance. She is obese. She is not ill-appearing.  HENT:  Head: Atraumatic.  Pulmonary:     Effort: Pulmonary effort is normal. No respiratory distress.  Neurological:     Mental Status: She is alert and oriented to person, place, and time.  Psychiatric:        Attention and Perception: Attention normal.        Mood and Affect: Mood normal.        Speech: Speech normal.        Behavior: Behavior normal. Behavior is cooperative.     Comments: Her mood is good and upbeat     Results for orders placed or performed during the hospital encounter of 05/27/19  Microalbumin, urine  Result Value Ref Range   Microalb, Ur 135.8 (H) Not Estab. ug/mL  Hemoglobin A1c  Result Value Ref Range   Hgb A1c MFr Bld 10.5 (H) 4.8 - 5.6 %   Mean Plasma Glucose 254.65 mg/dL  Lipid panel  Result Value Ref  Range   Cholesterol 187 0 - 200 mg/dL   Triglycerides 256 (H) <150 mg/dL   HDL 48 >40 mg/dL   Total CHOL/HDL Ratio 3.9 RATIO   VLDL 51 (H) 0 - 40 mg/dL   LDL Cholesterol 88 0 - 99 mg/dL  Comprehensive metabolic panel  Result Value Ref Range   Sodium 135 135 - 145 mmol/L   Potassium 3.7 3.5 - 5.1 mmol/L   Chloride 98 98 - 111 mmol/L   CO2 27 22 - 32 mmol/L   Glucose, Bld 238 (H) 70 - 99 mg/dL   BUN 15 6 - 20 mg/dL   Creatinine, Ser 0.60 0.44 - 1.00 mg/dL   Calcium 9.3 8.9 - 10.3 mg/dL   Total Protein 7.9 6.5 - 8.1 g/dL   Albumin 4.0 3.5 - 5.0 g/dL   AST 18 15 - 41 U/L   ALT 17 0 - 44 U/L   Alkaline Phosphatase 99 38 - 126 U/L   Total Bilirubin 0.5 0.3 - 1.2 mg/dL   GFR calc non Af Amer >60 >60 mL/min   GFR calc Af Amer >60 >60 mL/min   Anion gap 10 5 - 15      Assessment & Plan:   Encounter Diagnoses  Name Primary?  Marland Kitchen Uncontrolled type 2 diabetes mellitus with hyperglycemia ([place]) Yes  . Essential hypertension, benign   . Hyperlipidemia, unspecified hyperlipidemia type   . Tobacco use disorder   . Encounter for screening for malignant neoplasm of breast, unspecified screening modality       -reviewed labs with pt -rx losartan to replace the lisinopril.  She is encouraged to monitor her bp -refer for screening mammogram -Discussed insulin changes but pt wants to" fix it herself " with improving her diet and exercise -pt to continue other medications -pt to follow up in 3 months for in person appointment.  She is to contact office sooner if needed

## 2019-07-14 ENCOUNTER — Other Ambulatory Visit: Payer: Self-pay

## 2019-07-14 ENCOUNTER — Telehealth: Payer: Self-pay | Admitting: Gastroenterology

## 2019-07-14 DIAGNOSIS — R935 Abnormal findings on diagnostic imaging of other abdominal regions, including retroperitoneum: Secondary | ICD-10-CM

## 2019-07-14 NOTE — Telephone Encounter (Signed)
CT guided biopsy.  Right and comments: "Biopsy of subcutaneous nodule involving the low anterior abdominal wall."  The physician will review the information and determine if a different modality is needed.

## 2019-07-14 NOTE — Telephone Encounter (Signed)
Jocelyn Lamer at [employer] [employer] called office, IR doesn't do abdominal biopsy. They are done at [facility] and central scheduling will schedule.  Cyril Mourning, does she need CT biopsy or US biopsy.

## 2019-07-14 NOTE — Telephone Encounter (Signed)
Order was placed for IR 06/24/19. Tried to call IR, no answer, LMOVM for them to call pt or our office.  Called and informed pt.

## 2019-07-14 NOTE — Progress Notes (Unsigned)
biop

## 2019-07-14 NOTE — Telephone Encounter (Signed)
Patient called said that Casey Johnston was going to refer her to interventional radiology and she has not heard from the office yet.

## 2019-07-15 NOTE — Addendum Note (Signed)
Addended by: Zara Council C on: 07/15/2019 11:51 AM   Modules accepted: Orders

## 2019-07-15 NOTE — Telephone Encounter (Signed)
Order entered. LMOVM for [place] at central scheduling.

## 2019-07-16 ENCOUNTER — Encounter (HOSPITAL_COMMUNITY): Payer: Self-pay | Admitting: Radiology

## 2019-07-16 NOTE — Progress Notes (Signed)
Casey Johnston Female, 59 y.o., June 25, 1960 MRN:  [id number] Phone:  [phone] [name]) PCP:  Soyla Dryer, PA-C Primary Cvg:  None Next Appt With Primary Care 10/05/2019 at 11:15 AM  FW: CT Biopsy Received: Today Message Contents  Arne Cleveland, MD  Erenest Rasher, PA-C  Cc: Garth Bigness D  This is a superficially ulcerated dermal lesion, should not need imaging guidance for bx -- recommend derm eval with bx if needed.   DDH       Previous Messages   ----- Message -----  From: Garth Bigness D  Sent: 07/15/2019 12:29 PM EST  To: Ir Procedure Requests  Subject: CT Biopsy                     Procedure: CT Biopsy   Reason:  biopsy of subcutaneous nodule involving the low anterior abdominal wall, Abnormal CT of the abdomen   History: CT in computer   Provider: Erenest Rasher   Provider Contact: [phone]

## 2019-07-24 ENCOUNTER — Telehealth: Payer: Self-pay | Admitting: Gastroenterology

## 2019-07-24 DIAGNOSIS — R935 Abnormal findings on diagnostic imaging of other abdominal regions, including retroperitoneum: Secondary | ICD-10-CM

## 2019-07-24 NOTE — Telephone Encounter (Signed)
I had sent patient for CT guided biopsy for abdominal nodule.  Received staff message from Dr. Vernard Gambles stating the nodule is a superficially ulcerated dermal lesion and should not need image guidance for biopsy.  Recommended dermal evaluation with biopsy if needed.  Please let patient know of this.  If she has not already been referred to dermatology, please place referral.  Dx: Subcutaneous nodule involving the low anterior abdominal wall.  Abnormal CT.

## 2019-07-27 NOTE — Telephone Encounter (Signed)
Referral faxed

## 2019-07-27 NOTE — Addendum Note (Signed)
Addended by: Cheron Every on: 07/27/2019 01:17 PM   Modules accepted: Orders

## 2019-07-27 NOTE — Telephone Encounter (Signed)
LMOVM

## 2019-07-27 NOTE — Telephone Encounter (Signed)
Pt returned call and was given results. Please place dermatology referral per [facility].

## 2019-08-04 HISTORY — PX: OTHER SURGICAL HISTORY: SHX169

## 2019-08-05 ENCOUNTER — Encounter (HOSPITAL_COMMUNITY): Payer: Self-pay | Admitting: Radiology

## 2019-08-05 NOTE — Progress Notes (Signed)
Casey Johnston Female, 59 y.o., February 29, 1961 MRN:  [id number] Phone:  [phone] [name]) PCP:  Soyla Dryer, PA-C Primary Cvg:  None Next Appt With Primary Care 10/05/2019 at 11:15 AM  RE: CT Biopsy Received: Today Message Contents  Arne Cleveland, MD  Erenest Rasher, PA-C  Cc: Garth Bigness D  Ok that surprises me but we can set it up.  Just as easy for [place] to localize + bx as to localize + mark.   @Trequan Marsolek : ok for [place] core bx dermal lesion  See prior CT   Thx  DDH       Previous Messages   ----- Message -----  From: Roselyn Reef  Sent: 08/05/2019 12:36 PM EDT  To: Arne Cleveland, MD, Erenest Rasher, PA-C  Subject: RE: CT Biopsy                   Dr. Vernard Gambles,   Patient saw dermatology. Spoke with [name], [place]. States they are unable to palpate the lesion and therefore do not know where to biopsy. Asking if there is anyway the area of concern can be marked on the patient or if US guided biopsy would be beneficial.   Thanks,  [name], PA-C  [facility]   ----- Message -----  From: Arne Cleveland, MD  Sent: 07/16/2019 10:11 AM EDT  To: Adella Nissen, PA-C  Subject: FW: CT Biopsy                   This is a superficially ulcerated dermal lesion, should not need imaging guidance for bx -- recommend derm eval with bx if needed.   DDH    ----- Message -----  From: Garth Bigness D  Sent: 07/15/2019 12:29 PM EST  To: Ir Procedure Requests  Subject: CT Biopsy                     Procedure: CT Biopsy   Reason:  biopsy of subcutaneous nodule involving the low anterior abdominal wall, Abnormal CT of the abdomen   History: CT in computer   Provider: Erenest Rasher   Provider Contact: [phone]

## 2019-08-09 ENCOUNTER — Other Ambulatory Visit: Payer: Self-pay | Admitting: Student

## 2019-08-09 DIAGNOSIS — Z1239 Encounter for other screening for malignant neoplasm of breast: Secondary | ICD-10-CM

## 2019-08-13 ENCOUNTER — Other Ambulatory Visit: Payer: Self-pay | Admitting: Radiology

## 2019-08-14 ENCOUNTER — Other Ambulatory Visit: Payer: Self-pay | Admitting: Radiology

## 2019-08-14 ENCOUNTER — Other Ambulatory Visit: Payer: Self-pay | Admitting: Student

## 2019-08-17 ENCOUNTER — Encounter (HOSPITAL_COMMUNITY): Payer: Self-pay

## 2019-08-17 ENCOUNTER — Ambulatory Visit (HOSPITAL_COMMUNITY)
Admission: RE | Admit: 2019-08-17 | Discharge: 2019-08-17 | Disposition: A | Discharge status: Home / Self Care | Payer: Self-pay | Source: Ambulatory Visit | Attending: Gastroenterology | Admitting: Gastroenterology

## 2019-08-17 ENCOUNTER — Other Ambulatory Visit: Payer: Self-pay

## 2019-08-17 DIAGNOSIS — F17210 Nicotine dependence, cigarettes, uncomplicated: Secondary | ICD-10-CM | POA: Insufficient documentation

## 2019-08-17 DIAGNOSIS — R229 Localized swelling, mass and lump, unspecified: Secondary | ICD-10-CM | POA: Insufficient documentation

## 2019-08-17 DIAGNOSIS — E118 Type 2 diabetes mellitus with unspecified complications: Secondary | ICD-10-CM | POA: Insufficient documentation

## 2019-08-17 DIAGNOSIS — Z7901 Long term (current) use of anticoagulants: Secondary | ICD-10-CM | POA: Insufficient documentation

## 2019-08-17 DIAGNOSIS — E785 Hyperlipidemia, unspecified: Secondary | ICD-10-CM | POA: Insufficient documentation

## 2019-08-17 DIAGNOSIS — R935 Abnormal findings on diagnostic imaging of other abdominal regions, including retroperitoneum: Secondary | ICD-10-CM

## 2019-08-17 DIAGNOSIS — Z79899 Other long term (current) drug therapy: Secondary | ICD-10-CM | POA: Insufficient documentation

## 2019-08-17 DIAGNOSIS — Z794 Long term (current) use of insulin: Secondary | ICD-10-CM | POA: Insufficient documentation

## 2019-08-17 DIAGNOSIS — I10 Essential (primary) hypertension: Secondary | ICD-10-CM | POA: Insufficient documentation

## 2019-08-17 DIAGNOSIS — G4733 Obstructive sleep apnea (adult) (pediatric): Secondary | ICD-10-CM | POA: Insufficient documentation

## 2019-08-17 LAB — PROTIME-INR
INR: 0.9 (ref 0.8–1.2)
Prothrombin Time: 12.3 seconds (ref 11.4–15.2)

## 2019-08-17 LAB — CBC
HCT: 36.7 % (ref 36.0–46.0)
Hemoglobin: 11.6 g/dL — ABNORMAL LOW (ref 12.0–15.0)
MCH: 27.8 pg (ref 26.0–34.0)
MCHC: 31.6 g/dL (ref 30.0–36.0)
MCV: 88 fL (ref 80.0–100.0)
Platelets: 287 10*3/uL (ref 150–400)
RBC: 4.17 MIL/uL (ref 3.87–5.11)
RDW: 13.2 % (ref 11.5–15.5)
WBC: 10.7 10*3/uL — ABNORMAL HIGH (ref 4.0–10.5)
nRBC: 0 % (ref 0.0–0.2)

## 2019-08-17 LAB — GLUCOSE, CAPILLARY
Glucose-Capillary: 213 mg/dL — ABNORMAL HIGH (ref 70–99)
Glucose-Capillary: 127 mg/dL — ABNORMAL HIGH (ref 70–99)

## 2019-08-17 MED ORDER — HYDROCODONE-ACETAMINOPHEN 5-325 MG PO TABS: 1.0000 | ORAL_TABLET | ORAL | Status: DC | PRN

## 2019-08-17 MED ORDER — MIDAZOLAM HCL 2 MG/2ML IJ SOLN
INTRAMUSCULAR | Status: AC
  Filled 2019-08-17: qty 2

## 2019-08-17 MED ORDER — FENTANYL CITRATE (PF) 100 MCG/2ML IJ SOLN
INTRAMUSCULAR | Status: AC | PRN
  Administered 2019-08-17: 50 ug via INTRAVENOUS

## 2019-08-17 MED ORDER — FENTANYL CITRATE (PF) 100 MCG/2ML IJ SOLN
INTRAMUSCULAR | Status: AC
  Filled 2019-08-17: qty 2

## 2019-08-17 MED ORDER — LIDOCAINE HCL (PF) 1 % IJ SOLN
INTRAMUSCULAR | Status: AC
  Filled 2019-08-17: qty 30

## 2019-08-17 MED ORDER — MIDAZOLAM HCL 2 MG/2ML IJ SOLN
INTRAMUSCULAR | Status: AC | PRN
  Administered 2019-08-17: 1 mg via INTRAVENOUS

## 2019-08-17 MED ORDER — SODIUM CHLORIDE 0.9 % IV SOLN: INTRAVENOUS | Status: DC

## 2019-08-17 NOTE — H&P (Signed)
Chief Complaint: Patient was seen in consultation today for abdominal wall mass  Referring Physician(s): Harper,Kristen S  Supervising Physician: Arne Cleveland  Patient Status: [facility] - Out-pt  History of Present Illness: Casey Johnston is a 59 y.o. female with past medical history of anxiety, depressions, DM, UTI, HTN who presents to [facility] for biopsy of a subcutaneous nodule involving the lower anterior abdominal wall.  Case reviewed and approved by Dr. Vernard Gambles.  Patient presents in her usual state of health.  She denies fever, chills, cough, shortness of breath, nausea, vomiting, abdominal pain.  She reports she cannot feel any lesion or mass. She has been NPO.  She does not take blood thinners.   Past Medical History:  Diagnosis Date  . Anxiety and depression   . Diabetes mellitus, type II ([place])    Dr. Dorris Fetch managing  . Frequent headaches chronic   ibuprofen helps some  . History of UTI    Recurrent per pt (approx 3 per year)  . Hyperlipidemia   . Hypertension   . Leukocytosis 12/2015   mild lymphocytosis.  Path smear review reassuring.  Repeat 01/2016 stable.  . Morbid obesity ([place])   . OSA on CPAP     Past Surgical History:  Procedure Laterality Date  . ABDOMINAL HYSTERECTOMY  1998   Dysfunctional uterine bleeding.  No hx of abnormal paps.  Marland Kitchen BIOPSY  10/20/2018   Procedure: BIOPSY;  Surgeon: Danie Binder, MD;  Location: [facility];  Service: Endoscopy;;  stomach  . COLONOSCOPY  2012   Dr. Earley Brooke: 1.5 cm pendunculated descending colon polyp (tubulovillous adenoma) and diminutive hyperplastic polyps  . COLONOSCOPY N/A 10/20/2018   Procedure: COLONOSCOPY;  Surgeon: Danie Binder, MD;  Location: [facility];  Service: Endoscopy;  Laterality: N/A;  10:30am  . ESOPHAGOGASTRODUODENOSCOPY N/A 10/20/2018   Procedure: ESOPHAGOGASTRODUODENOSCOPY (EGD);  Surgeon: Danie Binder, MD;  Location: [facility];  Service: Endoscopy;  Laterality: N/A;  .  POLYPECTOMY  10/20/2018   Procedure: POLYPECTOMY;  Surgeon: Danie Binder, MD;  Location: [facility];  Service: Endoscopy;;    Allergies: Januvia [sitagliptin]  Medications: Prior to Admission medications   Medication Sig Start Date End Date Taking? Authorizing Provider  amLODipine (NORVASC) 10 MG tablet TAKE 1 Tablet BY MOUTH ONCE DAILY Patient taking differently: Take 10 mg by mouth daily.  07/07/19  Yes Soyla Dryer, PA-C  atorvastatin (LIPITOR) 20 MG tablet TAKE 1 Tablet BY MOUTH ONCE DAILY Patient taking differently: Take 20 mg by mouth daily.  07/07/19  Yes Soyla Dryer, PA-C  cloNIDine (CATAPRES) 0.1 MG tablet Take 1 tablet (0.1 mg total) by mouth 2 (two) times daily. 06/04/19  Yes Soyla Dryer, PA-C  insulin glargine (LANTUS) 100 UNIT/ML injection Inject 60 Units into the skin at bedtime.    Yes [provider]  losartan (COZAAR) 100 MG tablet Take 1 tablet (100 mg total) by mouth daily. 07/06/19  Yes Soyla Dryer, PA-C  lubiprostone (AMITIZA) 24 MCG capsule 1 PO QHS FOR 7 DAYS THEN 1 PO BID. TAKE WITH FOOD. Patient taking differently: Take 24 mcg by mouth 2 (two) times daily.  10/20/18  Yes Fields, Sandi L, MD  metFORMIN (GLUCOPHAGE) 1000 MG tablet TAKE 1 Tablet  BY MOUTH TWICE DAILY WITH A MEAL Patient taking differently: Take 1,000 mg by mouth in the morning and at bedtime.  07/07/19  Yes Soyla Dryer, PA-C  metoprolol tartrate (LOPRESSOR) 100 MG tablet TAKE 1 Tablet  BY MOUTH TWICE  DAILY Patient taking differently: Take 100 mg by mouth 2 (two) times daily.  07/07/19  Yes Soyla Dryer, PA-C  neomycin-bacitracin-polymyxin (NEOSPORIN) ointment Apply 1 application topically 2 (two) times daily as needed for wound care (nose).   Yes [provider]  Omega-3 Fatty Acids (FISH OIL) 1000 MG CAPS Take 1,000 mg by mouth daily.   Yes [provider]  pantoprazole (PROTONIX) 40 MG tablet TAKE 1 TABLET BY MOUTH TWICE DAILY BEFORE A MEAL Patient  taking differently: Take 40 mg by mouth daily before breakfast.  06/04/19  Yes Annitta Needs, NP  PROVENTIL HFA 108 (90 Base) MCG/ACT inhaler INHALE 2 PUFFS BY MOUTH EVERY 6 HOURS AS NEEDED FOR COUGHING, WHEEZING, OR SHORTNESS OF BREATH Patient taking differently: Inhale 2 puffs into the lungs every 6 (six) hours as needed for wheezing or shortness of breath.  07/07/19  Yes Soyla Dryer, PA-C     Family History  Problem Relation Age of Onset  . Cancer Mother        ovarian  . COPD Father   . Diabetes Father   . Colon cancer Neg Hx     Social History   Socioeconomic History  . Marital status: Widowed    Spouse name: Not on file  . Number of children: Not on file  . Years of education: Not on file  . Highest education level: Not on file  Occupational History  . Not on file  Tobacco Use  . Smoking status: Current Every Day Smoker    Packs/day: 0.25    Years: 20.00    Pack years: 5.00    Types: Cigarettes  . Smokeless tobacco: Never Used  Substance and Sexual Activity  . Alcohol use: No  . Drug use: No  . Sexual activity: Not on file  Other Topics Concern  . Not on file  Social History Narrative   Separated x 20 yrs, one daughter.  She lives with her.  Great niece and nephew live with her as well.   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   Tob: 7 pack year hx, quit 2010.   Alc: none   Exercise: none   Social Determinants of Health   Financial Resource Strain:   . Difficulty of Paying Living Expenses:   Food Insecurity:   . Worried About Charity fundraiser in the Last Year:   . Arboriculturist in the Last Year:   Transportation Needs:   . Film/video editor (Medical):   Marland Kitchen Lack of Transportation (Non-Medical):   Physical Activity:   . Days of Exercise per Week:   . Minutes of Exercise per Session:   Stress:   . Feeling of Stress :   Social Connections:   . Frequency of Communication with Friends and Family:   . Frequency of Social Gatherings with Friends  and Family:   . Attends Religious Services:   . Active Member of Clubs or Organizations:   . Attends Archivist Meetings:   Marland Kitchen Marital Status:      Review of Systems: A 12 point ROS discussed and pertinent positives are indicated in the HPI above.  All other systems are negative.  Review of Systems  Constitutional: Negative for fatigue and fever.  Respiratory: Negative for cough and shortness of breath.   Cardiovascular: Negative for chest pain.  Gastrointestinal: Negative for abdominal pain, nausea and vomiting.  Genitourinary: Negative for dysuria.  Musculoskeletal: Negative for back pain.  Psychiatric/Behavioral: Negative for behavioral problems and  confusion.    Vital Signs: BP (!) 145/67 (BP Location: Left Arm)   Pulse (!) 59   Temp 98 F (36.7 C) (Skin)   Resp 16   Ht 5\' 5"  (1.651 m)   Wt 195 lb (88.5 kg)   SpO2 96%   BMI 32.45 kg/m   Physical Exam Vitals and nursing note reviewed.  Constitutional:      General: She is not in acute distress.    Appearance: Normal appearance. She is not ill-appearing.  HENT:     Mouth/Throat:     Mouth: Mucous membranes are moist.     Pharynx: Oropharynx is clear.  Cardiovascular:     Rate and Rhythm: Normal rate and regular rhythm.  Pulmonary:     Effort: Pulmonary effort is normal. No respiratory distress.     Breath sounds: Normal breath sounds.  Abdominal:     General: Abdomen is flat. There is no distension.     Palpations: Abdomen is soft. There is no mass.     Tenderness: There is no abdominal tenderness.  Skin:    General: Skin is warm and dry.  Neurological:     General: No focal deficit present.     Mental Status: She is alert and oriented to person, place, and time. Mental status is at baseline.  Psychiatric:        Mood and Affect: Mood normal.        Behavior: Behavior normal.        Thought Content: Thought content normal.        Judgment: Judgment normal.      MD Evaluation Airway:  WNL Heart: WNL Abdomen: WNL Chest/ Lungs: WNL ASA  Classification: 2 Mallampati/Airway Score: Two   Imaging: No results found.  Labs:  CBC: Recent Labs    10/06/18 1923 10/06/18 1923 10/07/18 0820 10/08/18 0547 11/07/18 1420 08/17/19 1141  WBC 15.3*  --  14.4* 11.6*  --  10.7*  HGB 11.7*   < > 11.0* 10.2* 12.3 11.6*  HCT 35.7*   < > 33.2* 30.9* 37.0 36.7  PLT 272  --  247 246  --  287   < > = values in this interval not displayed.    COAGS: Recent Labs    08/17/19 1141  INR 0.9    BMP: Recent Labs    11/07/18 1420 12/12/18 1454 02/13/19 1025 05/27/19 1517  NA 142 141 140 135  K 3.2* 3.6 3.6 3.7  CL 106 106 105 98  CO2 26 25 28 27   GLUCOSE 110* 207* 134* 238*  BUN 11 11 10 15   CALCIUM 9.5 9.5 9.2 9.3  CREATININE 0.54 0.59 0.59 0.60  GFRNONAA >60 >60 >60 >60  GFRAA >60 >60 >60 >60    LIVER FUNCTION TESTS: Recent Labs    10/06/18 1923 11/07/18 1420 02/13/19 1025 05/27/19 1517  BILITOT 0.6 0.5 0.3 0.5  AST 13* 17 17 18   ALT 11 14 14 17   ALKPHOS 84 83 80 99  PROT 7.9 7.8 7.5 7.9  ALBUMIN 3.5 3.8 3.7 4.0    TUMOR MARKERS: No results for input(s): AFPTM, CEA, CA199, CHROMGRNA in the last 8760 hours.  Assessment and Plan: Patient with past medical history of  presents with complaint of chronic abdominal pain, unchanged from baseline.  A CT Abdomen from May 2020 showed increased size of an abdominal wall nodule and she is referred for biopsy by her PCP.   Case reviewed by Dr. Vernard Gambles who approves patient for  procedure.  Patient presents today in their usual state of health.  She has been NPO and is not currently on blood thinners.    Risks and benefits was discussed with the patient and/or patient's family including, but not limited to bleeding, infection, damage to adjacent structures or low yield requiring additional tests.  All of the questions were answered and there is agreement to proceed.  Consent signed and in chart.  Thank you for  this interesting consult.  I greatly enjoyed meeting [place] and look forward to participating in their care.  A copy of this report was sent to the requesting provider on this date.  Electronically Signed: Docia Barrier, PA 08/17/2019, 1:03 PM   I spent a total of  30 Minutes   in face to face in clinical consultation, greater than 50% of which was counseling/coordinating care for abdominal wall mass.

## 2019-08-17 NOTE — Procedures (Signed)
  Procedure: [place] core R pelvic subcutaneous mass   EBL:   minimal Complications:  none immediate  See full dictation in [facility].  Dillard Cannon MD Main # [phone] Pager  [phone]

## 2019-08-17 NOTE — Discharge Instructions (Signed)
Biopsy Discharge Instructions  The procedure you just had is called a biopsy.  You may feel some discomfort after the local anesthetic wears off.  Your discomfort should improve over the next several days.  AFTER YOUR BIOPSY  Rest for the remainder of the day.  Avoid heavy lifting (more than 10 lb/4.5 kg).  If you have been given a general anesthetic or other medications to help you relax, you should not operate machinery, drive or make legal decisions for 24 hours after your procedure.  Additionally, someone must be available to drive you home.  Only take over-the-counter or prescription medicines for pain, discomfort, or fever as directed by your caregiver.  This can make bleeding worse.  You may resume your usual diet after the procedure.  Avoid alcoholic beverages for 24 hours after your procedure.  Keep the skin around your biopsy site clean and dry.  Remove bandaid in 24 hours  You may shower after 24 hours.  Cleanse and dry the biopsy site completely after you shower.  Avoid baths and swimming for 72 hours.  Complications are very uncommon after this procedure.  Go to the nearest Emergency Department or contact your caregiver if you develop any of the following symptoms:  Worsening pain  Bleeding  Swelling at the biopsy site  Light headedness or dizziness  Shortness of Breath  Fever or chills  Redness or increased pain or swelling at the biopsy site

## 2019-08-18 LAB — SURGICAL PATHOLOGY

## 2019-08-20 ENCOUNTER — Ambulatory Visit (HOSPITAL_COMMUNITY)
Admission: RE | Admit: 2019-08-20 | Discharge: 2019-08-20 | Disposition: A | Discharge status: Home / Self Care | Payer: Self-pay | Source: Ambulatory Visit | Attending: Physician Assistant | Admitting: Physician Assistant

## 2019-08-20 ENCOUNTER — Other Ambulatory Visit: Payer: Self-pay

## 2019-08-20 DIAGNOSIS — Z1239 Encounter for other screening for malignant neoplasm of breast: Secondary | ICD-10-CM | POA: Insufficient documentation

## 2019-09-20 ENCOUNTER — Encounter: Payer: Self-pay | Admitting: Gastroenterology

## 2019-10-05 ENCOUNTER — Other Ambulatory Visit: Payer: Self-pay | Admitting: Physician Assistant

## 2019-10-05 ENCOUNTER — Ambulatory Visit: Payer: HRSA Program | Admitting: Physician Assistant

## 2019-11-06 ENCOUNTER — Other Ambulatory Visit (HOSPITAL_COMMUNITY)
Admission: RE | Admit: 2019-11-06 | Discharge: 2019-11-06 | Disposition: A | Discharge status: Home / Self Care | Payer: Self-pay | Source: Ambulatory Visit | Attending: Physician Assistant | Admitting: Physician Assistant

## 2019-11-06 ENCOUNTER — Other Ambulatory Visit: Payer: Self-pay

## 2019-11-06 DIAGNOSIS — I10 Essential (primary) hypertension: Secondary | ICD-10-CM | POA: Insufficient documentation

## 2019-11-06 DIAGNOSIS — E1165 Type 2 diabetes mellitus with hyperglycemia: Secondary | ICD-10-CM | POA: Insufficient documentation

## 2019-11-06 DIAGNOSIS — E785 Hyperlipidemia, unspecified: Secondary | ICD-10-CM | POA: Insufficient documentation

## 2019-11-06 LAB — HEMOGLOBIN A1C
Hgb A1c MFr Bld: 9.6 % — ABNORMAL HIGH (ref 4.8–5.6)
Mean Plasma Glucose: 228.82 mg/dL

## 2019-11-06 LAB — COMPREHENSIVE METABOLIC PANEL
ALT: 16 U/L (ref 0–44)
AST: 19 U/L (ref 15–41)
Albumin: 4 g/dL (ref 3.5–5.0)
Alkaline Phosphatase: 83 U/L (ref 38–126)
Anion gap: 12 (ref 5–15)
BUN: 20 mg/dL (ref 6–20)
CO2: 25 mmol/L (ref 22–32)
Calcium: 8.9 mg/dL (ref 8.9–10.3)
Chloride: 101 mmol/L (ref 98–111)
Creatinine, Ser: 0.84 mg/dL (ref 0.44–1.00)
GFR calc Af Amer: >60 mL/min (ref >60)
GFR calc non Af Amer: >60 mL/min (ref >60)
Glucose, Bld: 217 mg/dL — ABNORMAL HIGH (ref 70–99)
Potassium: 4.2 mmol/L (ref 3.5–5.1)
Sodium: 138 mmol/L (ref 135–145)
Total Bilirubin: 0.2 mg/dL — ABNORMAL LOW (ref 0.3–1.2)
Total Protein: 8.3 g/dL — ABNORMAL HIGH (ref 6.5–8.1)

## 2019-11-06 LAB — LIPID PANEL
Cholesterol: 147 mg/dL (ref 0–200)
HDL: 38 mg/dL — ABNORMAL LOW (ref >40)
LDL Cholesterol: 72 mg/dL (ref 0–99)
Total CHOL/HDL Ratio: 3.9 RATIO
Triglycerides: 186 mg/dL — ABNORMAL HIGH (ref <150)
VLDL: 37 mg/dL (ref 0–40)

## 2019-11-11 ENCOUNTER — Encounter: Payer: Self-pay | Admitting: Physician Assistant

## 2019-11-11 ENCOUNTER — Ambulatory Visit: Payer: HRSA Program | Admitting: Physician Assistant

## 2019-11-11 VITALS — BP 110/60 | HR 64 | Temp 99.1°F | Ht 64.0 in | Wt 195.2 lb

## 2019-11-11 DIAGNOSIS — Z2821 Immunization not carried out because of patient refusal: Secondary | ICD-10-CM

## 2019-11-11 DIAGNOSIS — I10 Essential (primary) hypertension: Secondary | ICD-10-CM

## 2019-11-11 DIAGNOSIS — F17200 Nicotine dependence, unspecified, uncomplicated: Secondary | ICD-10-CM

## 2019-11-11 DIAGNOSIS — E785 Hyperlipidemia, unspecified: Secondary | ICD-10-CM

## 2019-11-11 DIAGNOSIS — E1165 Type 2 diabetes mellitus with hyperglycemia: Secondary | ICD-10-CM

## 2019-11-11 NOTE — Progress Notes (Signed)
BP 110/60   Pulse 64   Temp 99.1 F (37.3 C)   Ht 5\' 4"  (1.626 m)   Wt 195 lb 4 oz (88.6 kg)   SpO2 98%   BMI 33.51 kg/m    Subjective:    Patient ID: Casey Johnston, female    DOB: 1961-02-09, 59 y.o.   MRN: [id number]  HPI: Casey Johnston is a 59 y.o. female presenting on 11/11/2019 for Diabetes, Hypertension, and Hyperlipidemia   HPI  Pt had a negative covid 19 screening questionnaire.     Pt is 15yoF with uncontrolled DM, htn, dyslipidemia.    She is not going to get covid vaccine.  She is scared of it.  Discussed risks and benefits and she says she just isn't going to get it.   She found a job.  she is working at [employer] in [place].  .  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   She says she is doing pretty good. She says she sometimes gets food cravings and so doesn't watch what she eats.  She is still smoking some but is working on cutting back.     Relevant past medical, surgical, family and social history reviewed and updated as indicated. Interim medical history since our last visit reviewed. Allergies and medications reviewed and updated.   Current Outpatient Medications:  .  amLODipine (NORVASC) 10 MG tablet, TAKE 1 Tablet BY MOUTH ONCE DAILY, Disp: 90 tablet, Rfl: 0 .  atorvastatin (LIPITOR) 20 MG tablet, TAKE 1 Tablet BY MOUTH ONCE DAILY (Patient taking differently: Take 20 mg by mouth daily. ), Disp: 90 tablet, Rfl: 2 .  cloNIDine (CATAPRES) 0.1 MG tablet, TAKE 1 Tablet  BY MOUTH TWICE DAILY, Disp: 180 tablet, Rfl: 0 .  insulin glargine (LANTUS) 100 UNIT/ML injection, Inject 60 Units into the skin at bedtime. , Disp: , Rfl:  .  losartan (COZAAR) 100 MG tablet, Take 1 tablet (100 mg total) by mouth daily., Disp: 90 tablet, Rfl: 1 .  lubiprostone (AMITIZA) 24 MCG capsule, 1 PO QHS FOR 7 DAYS THEN 1 PO BID. TAKE WITH FOOD. (Patient taking differently: Take 24 mcg by  mouth 2 (two) times daily. ), Disp: 62 capsule, Rfl: 11 .  metFORMIN (GLUCOPHAGE) 1000 MG tablet, TAKE 1 Tablet  BY MOUTH TWICE DAILY WITH A MEAL, Disp: 180 tablet, Rfl: 0 .  metoprolol tartrate (LOPRESSOR) 100 MG tablet, TAKE 1 Tablet  BY MOUTH TWICE DAILY, Disp: 180 tablet, Rfl: 0 .  Omega-3 Fatty Acids (FISH OIL) 1000 MG CAPS, Take 1,000 mg by mouth daily., Disp: , Rfl:  .  pantoprazole (PROTONIX) 40 MG tablet, TAKE 1 TABLET BY MOUTH TWICE DAILY BEFORE A MEAL (Patient taking differently: Take 40 mg by mouth daily. ), Disp: 60 tablet, Rfl: 3 .  PROVENTIL HFA 108 (90 Base) MCG/ACT inhaler, INHALE 2 PUFFS BY MOUTH EVERY 6 HOURS AS NEEDED FOR COUGHING, WHEEZING, OR SHORTNESS OF BREATH, Disp: 20.1 g, Rfl: 0     Review of Systems  Per HPI unless specifically indicated above     Objective:    BP 110/60   Pulse 64   Temp 99.1 F (37.3 C)   Ht 5\' 4"  (1.626 m)   Wt 195 lb 4 oz (88.6 kg)   SpO2 98%   BMI 33.51 kg/m   Wt Readings from Last 3 Encounters:  11/11/19 195  lb 4 oz (88.6 kg)  08/17/19 195 lb (88.5 kg)  06/24/19 197 lb 3.2 oz (89.4 kg)    Physical Exam Vitals reviewed.  Constitutional:      General: She is not in acute distress.    Appearance: She is well-developed. She is not toxic-appearing.  HENT:     Head: Normocephalic and atraumatic.  Cardiovascular:     Rate and Rhythm: Normal rate and regular rhythm.  Pulmonary:     Effort: Pulmonary effort is normal.     Breath sounds: Normal breath sounds.  Abdominal:     General: Bowel sounds are normal.     Palpations: Abdomen is soft. There is no mass.     Tenderness: There is no abdominal tenderness.  Musculoskeletal:     Cervical back: Neck supple.     Right lower leg: No edema.     Left lower leg: No edema.  Lymphadenopathy:     Cervical: No cervical adenopathy.  Skin:    General: Skin is warm and dry.  Neurological:     Mental Status: She is alert and oriented to person, place, and time.     Motor: No tremor.      Gait: Gait is intact. Gait normal.  Psychiatric:        Attention and Perception: Attention normal.        Speech: Speech normal.        Behavior: Behavior normal. Behavior is cooperative.     Results for orders placed or performed during the hospital encounter of 11/06/19  Hemoglobin A1c  Result Value Ref Range   Hgb A1c MFr Bld 9.6 (H) 4.8 - 5.6 %   Mean Plasma Glucose 228.82 mg/dL  Lipid panel  Result Value Ref Range   Cholesterol 147 0 - 200 mg/dL   Triglycerides 186 (H) <150 mg/dL   HDL 38 (L) >40 mg/dL   Total CHOL/HDL Ratio 3.9 RATIO   VLDL 37 0 - 40 mg/dL   LDL Cholesterol 72 0 - 99 mg/dL  Comprehensive metabolic panel  Result Value Ref Range   Sodium 138 135 - 145 mmol/L   Potassium 4.2 3.5 - 5.1 mmol/L   Chloride 101 98 - 111 mmol/L   CO2 25 22 - 32 mmol/L   Glucose, Bld 217 (H) 70 - 99 mg/dL   BUN 20 6 - 20 mg/dL   Creatinine, Ser 0.84 0.44 - 1.00 mg/dL   Calcium 8.9 8.9 - 10.3 mg/dL   Total Protein 8.3 (H) 6.5 - 8.1 g/dL   Albumin 4.0 3.5 - 5.0 g/dL   AST 19 15 - 41 U/L   ALT 16 0 - 44 U/L   Alkaline Phosphatase 83 38 - 126 U/L   Total Bilirubin 0.2 (L) 0.3 - 1.2 mg/dL   GFR calc non Af Amer >60 >60 mL/min   GFR calc Af Amer >60 >60 mL/min   Anion gap 12 5 - 15      Assessment & Plan:    Encounter Diagnoses  Name Primary?  Marland Kitchen Uncontrolled type 2 diabetes mellitus with hyperglycemia ([place]) Yes  . Essential hypertension, benign   . Hyperlipidemia, unspecified hyperlipidemia type   . Tobacco use disorder   . COVID-19 virus vaccination declined     -reviewed labs with pt  -discussed uncontrolled DM.  Discussed adding mealtime insulin with novolog versus watching diet better.  Discussed possible referral to specialist if she gets insurance and/or more diabetic meds would be available.  Pt says she doesn't  want to start mealtime insulin at this time and will try to watch DM diet better.  She is given more reading information on DM diet. -DM foot exam  updated -no changes to medications today -Pt is scheduled to follow up in 3 months.

## 2019-12-02 ENCOUNTER — Other Ambulatory Visit: Payer: Self-pay

## 2019-12-03 ENCOUNTER — Telehealth: Payer: Self-pay | Admitting: Emergency Medicine

## 2019-12-03 DIAGNOSIS — K219 Gastro-esophageal reflux disease without esophagitis: Secondary | ICD-10-CM

## 2019-12-03 NOTE — Telephone Encounter (Signed)
Pt is requesting a refill on pantoprazole 40mg  tab . Take one by mouth 30 mins prior to first meal

## 2019-12-04 MED ORDER — PANTOPRAZOLE SODIUM 40 MG PO TBEC: 40.0000 mg | DELAYED_RELEASE_TABLET | Freq: Every day | ORAL | 5 refills | Status: DC

## 2019-12-04 NOTE — Telephone Encounter (Signed)
Rx sent 

## 2019-12-04 NOTE — Addendum Note (Signed)
Addended by: Gordy Levan, Skie Vitrano A on: 12/04/2019 12:15 PM   Modules accepted: Orders

## 2019-12-07 NOTE — Telephone Encounter (Signed)
Lmom, waiting on a return call. Pharmacy would like instructions for Pantoprazole 40 mg. Per pts last ov, pt was taking medication once daily. Need to verify if pt is still taking medication once daily instead of twice daily. Instructions on pts medications was sent in to pts pharmacy with both set of instructions(take once daily & take twice daily).

## 2019-12-22 NOTE — Progress Notes (Signed)
Referring Provider: Soyla Dryer, PA-C Primary Care Physician:  Soyla Dryer, PA-C Primary GI Physician: Dr. Oneida Alar previously; pending Dr. Abbey Chatters  Chief Complaint  Patient presents with   Constipation    Amitiza not helping, BM about 1 per week, straining, no bleeding   Abdominal Pain    comes/goes but doing okay   Gastroesophageal Reflux    f/u. Doing fine    HPI:   [place] is a 59 y.o. female presenting today for follow-up. History of GERD, constipation, chronic abdominal pain that seems to be associated with underlying constipation. TCS and EGD in June 2020. TCS with 4 tubular adenomas, diverticulosis in the sigmoid colon, tortuous left colon, external and internal hemorrhoids. Due for repeat in 2023. EGD with gastritis and duodenitis. No H. Pylori.   Last seen on 06/24/2019. Had not received Linzess yet. Was waiting to hear from the health department. Currently taking Amitiza 24 mcg BID and MiraLAX daily with hard BM every 5 days. Linzess 145 mcg samples worked very well for her. Feels like she has excess gas. Eats broccoli, onions, brussle sprouts. Felt abdominal pain was improved and only occurred occasionally and felt it was associated with constipation as it improved after a good BM. Protonix one daily was working well for GERD. Reviewed prior CT imaging from May 2020 which which showed 1.2 cm subcutaneous nodule in the right low anterior abdominal wall. Patient was advised to call health department to follow-up on Linzess, continue Amitiza 24 mcg BID for now, increase MiraLAX BID, add fiber supplement daily, avoid gas producing items, continue Protonix daily, refer to IR for further evaluation of subcutaneous nodule.   Patient ultimately underwent US core biopsy on 3/29 which revealed benign epidermoid cyst.   Today:   Constipation: States health department denied Linzess based on 2019 taxes.  Amitiza 24 mcg twice daily was not working well.  She started  taking Amitiza 24 mcg TID every other day for the last couple of weeks. Having a BM no more than once a week. No blood in the stool or blacks stool. Never added MiraLAX daily.   Mild intemittent abdominal pain a when constipation. Improves when she is able to get her bowels to move well.   GERD: Protonix 40 mg daily. Working very well. No dysphagia.   Mild intermittent nausea without vomiting. No identified trigger. Short lived and resolves on its own. Eating well. Gaining weight.    Past Medical History:  Diagnosis Date   Anxiety and depression    Diabetes mellitus, type II ([place])    Dr. Dorris Fetch managing   Frequent headaches chronic   ibuprofen helps some   History of UTI    Recurrent per pt (approx 3 per year)   Hyperlipidemia    Hypertension    Leukocytosis 12/2015   mild lymphocytosis.  Path smear review reassuring.  Repeat 01/2016 stable.   Morbid obesity ([place])    OSA on CPAP     Past Surgical History:  Procedure Laterality Date   ABDOMINAL HYSTERECTOMY  1998   Dysfunctional uterine bleeding.  No hx of abnormal paps.   BIOPSY  10/20/2018   Procedure: BIOPSY;  Surgeon: Danie Binder, MD;  [facility]: [facility];  Service: Endoscopy;;  stomach   COLONOSCOPY  2012   Dr. Earley Brooke: 1.5 cm pendunculated descending colon polyp (tubulovillous adenoma) and diminutive hyperplastic polyps   COLONOSCOPY N/A 10/20/2018   Procedure: COLONOSCOPY;  Surgeon: Danie Binder, MD; 4 tubular adenomas, diverticulosis in  the sigmoid colon, tortuous left colon, external and internal hemorrhoids. Due for repeat in 2023.    ESOPHAGOGASTRODUODENOSCOPY N/A 10/20/2018   Procedure: ESOPHAGOGASTRODUODENOSCOPY (EGD);  Surgeon: Danie Binder, MD;  gastritis and duodenitis. No H. Pylori.    POLYPECTOMY  10/20/2018   Procedure: POLYPECTOMY;  Surgeon: Danie Binder, MD;  [facility]: [facility];  Service: Endoscopy;;   wart removal  08/04/2019   benign    Current Outpatient Medications    Medication Sig Dispense Refill   amLODipine (NORVASC) 10 MG tablet TAKE 1 Tablet BY MOUTH ONCE DAILY 90 tablet 0   atorvastatin (LIPITOR) 20 MG tablet TAKE 1 Tablet BY MOUTH ONCE DAILY (Patient taking differently: Take 20 mg by mouth daily. ) 90 tablet 2   cloNIDine (CATAPRES) 0.1 MG tablet TAKE 1 Tablet  BY MOUTH TWICE DAILY 180 tablet 0   insulin glargine (LANTUS) 100 UNIT/ML injection Inject 60 Units into the skin at bedtime.      losartan (COZAAR) 100 MG tablet Take 1 tablet (100 mg total) by mouth daily. 90 tablet 1   lubiprostone (AMITIZA) 24 MCG capsule 1 PO QHS FOR 7 DAYS THEN 1 PO BID. TAKE WITH FOOD. (Patient taking differently: Take 24 mcg by mouth 2 (two) times daily. ) 62 capsule 11   metFORMIN (GLUCOPHAGE) 1000 MG tablet TAKE 1 Tablet  BY MOUTH TWICE DAILY WITH A MEAL 180 tablet 0   metoprolol tartrate (LOPRESSOR) 100 MG tablet TAKE 1 Tablet  BY MOUTH TWICE DAILY 180 tablet 0   Omega-3 Fatty Acids (FISH OIL) 1000 MG CAPS Take 1,000 mg by mouth daily.     pantoprazole (PROTONIX) 40 MG tablet Take 1 tablet (40 mg total) by mouth daily. TAKE 1 TABLET BY MOUTH TWICE DAILY BEFORE A MEAL 30 tablet 5   PROVENTIL HFA 108 (90 Base) MCG/ACT inhaler INHALE 2 PUFFS BY MOUTH EVERY 6 HOURS AS NEEDED FOR COUGHING, WHEEZING, OR SHORTNESS OF BREATH 20.1 g 0   No current facility-administered medications for this visit.    Allergies as of 12/23/2019 - Review Complete 12/23/2019  Allergen Reaction Noted   Januvia [sitagliptin] Other (See Comments) 05/21/2018    Family History  Problem Relation Age of Onset   Cancer Mother        ovarian   COPD Father    Diabetes Father    Colon cancer Neg Hx     Social History   Socioeconomic History   Marital status: Widowed    Spouse name: Not on file   Number of children: Not on file   Years of education: Not on file   Highest education level: Not on file  Occupational History   Not on file  Tobacco Use   Smoking status:  Current Every Day Smoker    Packs/day: 0.25    Years: 20.00    Pack years: 5.00    Types: Cigarettes   Smokeless tobacco: Never Used  Vaping Use   Vaping Use: Never used  Substance and Sexual Activity   Alcohol use: No   Drug use: No   Sexual activity: Not on file  Other Topics Concern   Not on file  Social History Narrative   Separated x 20 yrs, one daughter.  She lives with her.  Great niece and nephew live with her as well.   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   Tob: 7 pack year hx, quit 2010.   Alc: none   Exercise: none   Social Determinants of Health  Financial Resource Strain:    Difficulty of Paying Living Expenses:   Food Insecurity:    Worried About Charity fundraiser in the Last Year:    Arboriculturist in the Last Year:   Transportation Needs:    Film/video editor (Medical):    Lack of Transportation (Non-Medical):   Physical Activity:    Days of Exercise per Week:    Minutes of Exercise per Session:   Stress:    Feeling of Stress :   Social Connections:    Frequency of Communication with Friends and Family:    Frequency of Social Gatherings with Friends and Family:    Attends Religious Services:    Active Member of Clubs or Organizations:    Attends Music therapist:    Marital Status:     Review of Systems: Gen: Denies fever, chills, cold or flulike symptoms, lightheadedness, dizziness, presyncope, syncope. CV: Denies chest pain for heart palpitationsn. Resp: Denies dyspnea or cough.  GI: See HPI  Heme: See HPI  Physical Exam: BP 122/74    Pulse (!) 59    Temp (!) 97 F (36.1 C)    Ht 5\' 4"  (1.626 m)    Wt 199 lb 6.4 oz (90.4 kg)    BMI 34.23 kg/m  General:   Alert and oriented. No distress noted. Pleasant and cooperative.  Head:  Normocephalic and atraumatic. Eyes:  Conjuctiva clear without scleral icterus. Heart:  S1, S2 present without murmurs appreciated. Lungs:  Clear to auscultation  bilaterally. No wheezes, rales, or rhonchi. No distress.  Abdomen:  +BS, soft, non-tender and non-distended. No rebound or guarding. No HSM or masses noted. Msk:  Symmetrical without gross deformities. Normal posture. Extremities:  Without edema. Neurologic:  Alert and  oriented x4 Psych: Normal mood and affect.

## 2019-12-23 ENCOUNTER — Ambulatory Visit (INDEPENDENT_AMBULATORY_CARE_PROVIDER_SITE_OTHER): Payer: Self-pay | Admitting: Gastroenterology

## 2019-12-23 ENCOUNTER — Other Ambulatory Visit: Payer: Self-pay

## 2019-12-23 ENCOUNTER — Encounter: Payer: Self-pay | Admitting: Gastroenterology

## 2019-12-23 VITALS — BP 122/74 | HR 59 | Temp 97.0°F | Ht 64.0 in | Wt 199.4 lb

## 2019-12-23 DIAGNOSIS — K5900 Constipation, unspecified: Secondary | ICD-10-CM

## 2019-12-23 DIAGNOSIS — K219 Gastro-esophageal reflux disease without esophagitis: Secondary | ICD-10-CM

## 2019-12-23 NOTE — Assessment & Plan Note (Signed)
Well-controlled on Protonix 40 mg daily.  No alarm symptoms.  Advise she continue her current medications.  Follow-up in 6 months.

## 2019-12-23 NOTE — Patient Instructions (Signed)
We are providing you with samples of Linzess 145 mcg today. Please stop Amitiza and take 1 capsule of Linzess daily (Do not take Amitiza and Linzess together).  I will have nursing staff look into patient assistance for Linzess. If you do not have patient assistance approved before running out of samples, you may call our office to see if we have samples.   Be sure you are drinking enough water to keep your urine pale yellow to clear.  Continue taking Protonix 40 mg daily 30 minutes before breakfast.  Follow a GERD diet:  Avoid fried, fatty, greasy, spicy, citrus foods. Avoid caffeine and carbonated beverages. Avoid chocolate. Try eating 4-6 small meals a day rather than 3 large meals. Do not eat within 3 hours of laying down. Prop head of bed up on wood or bricks to create a 6 inch incline.  We will see you back in 6 months. Do not hesitate to call with questions or concerns prior.   [name], PA-C [facility]

## 2019-12-23 NOTE — Assessment & Plan Note (Addendum)
Chronic.  Not adequately controlled on Amitiza 24 mcg twice daily.  She is not currently taking MiraLAX daily as previously instructed.  Samples of Linzess worked very well for her but unable to afford medication.  She reports the health department denied Linzess based on 2019 taxes.  She has associated mild abdominal pain with constipation that improves with a good bowel movement.  No alarm symptoms.  Colonoscopy up-to-date in June 2020 with 4 tubular adenomas, diverticulosis in the sigmoid colon, tortuous left colon, and external and internal hemorrhoids.  Plan: Stop Amitiza and start Linzess 145 mcg.  Samples provided.  We will have patient resubmit for patient assistance.  I have also asked nursing staff to look into any other options for patient. Advised if she runs out of Linzess samples before approved for the medication, she can contact her office to see if we have any samples.  Otherwise, we could try resuming Amitiza 24 mcg with addition of MiraLAX daily to twice daily while waiting on Linzess approval. Advised to drink enough water to keep urine pale yellow to clear. Follow-up in 6 months.

## 2019-12-24 NOTE — Progress Notes (Signed)
Cc'ed to pcp °

## 2019-12-29 ENCOUNTER — Other Ambulatory Visit: Payer: Self-pay | Admitting: Physician Assistant

## 2020-02-08 ENCOUNTER — Other Ambulatory Visit: Payer: Self-pay | Admitting: Physician Assistant

## 2020-02-08 DIAGNOSIS — E1165 Type 2 diabetes mellitus with hyperglycemia: Secondary | ICD-10-CM

## 2020-02-08 DIAGNOSIS — E785 Hyperlipidemia, unspecified: Secondary | ICD-10-CM

## 2020-02-08 DIAGNOSIS — I10 Essential (primary) hypertension: Secondary | ICD-10-CM

## 2020-02-09 ENCOUNTER — Encounter: Payer: Self-pay | Admitting: Physician Assistant

## 2020-02-09 ENCOUNTER — Ambulatory Visit: Payer: HRSA Program | Admitting: Physician Assistant

## 2020-02-09 ENCOUNTER — Other Ambulatory Visit: Payer: Self-pay

## 2020-02-09 ENCOUNTER — Other Ambulatory Visit (HOSPITAL_COMMUNITY)
Admission: RE | Admit: 2020-02-09 | Discharge: 2020-02-09 | Disposition: A | Discharge status: Home / Self Care | Payer: Self-pay | Source: Ambulatory Visit | Attending: Physician Assistant | Admitting: Physician Assistant

## 2020-02-09 VITALS — BP 148/78 | HR 86 | Temp 97.6°F | Ht 64.0 in | Wt 201.6 lb

## 2020-02-09 DIAGNOSIS — E785 Hyperlipidemia, unspecified: Secondary | ICD-10-CM | POA: Insufficient documentation

## 2020-02-09 DIAGNOSIS — F17200 Nicotine dependence, unspecified, uncomplicated: Secondary | ICD-10-CM

## 2020-02-09 DIAGNOSIS — E1165 Type 2 diabetes mellitus with hyperglycemia: Secondary | ICD-10-CM | POA: Insufficient documentation

## 2020-02-09 DIAGNOSIS — I10 Essential (primary) hypertension: Secondary | ICD-10-CM | POA: Insufficient documentation

## 2020-02-09 DIAGNOSIS — M25532 Pain in left wrist: Secondary | ICD-10-CM

## 2020-02-09 DIAGNOSIS — E669 Obesity, unspecified: Secondary | ICD-10-CM

## 2020-02-09 DIAGNOSIS — M25512 Pain in left shoulder: Secondary | ICD-10-CM

## 2020-02-09 DIAGNOSIS — G8929 Other chronic pain: Secondary | ICD-10-CM

## 2020-02-09 LAB — COMPREHENSIVE METABOLIC PANEL
ALT: 13 U/L (ref 0–44)
AST: 15 U/L (ref 15–41)
Albumin: 3.7 g/dL (ref 3.5–5.0)
Alkaline Phosphatase: 83 U/L (ref 38–126)
Anion gap: 9 (ref 5–15)
BUN: 11 mg/dL (ref 6–20)
CO2: 27 mmol/L (ref 22–32)
Calcium: 9.3 mg/dL (ref 8.9–10.3)
Chloride: 101 mmol/L (ref 98–111)
Creatinine, Ser: 0.65 mg/dL (ref 0.44–1.00)
GFR calc Af Amer: >60 mL/min (ref >60)
GFR calc non Af Amer: >60 mL/min (ref >60)
Glucose, Bld: 275 mg/dL — ABNORMAL HIGH (ref 70–99)
Potassium: 3.8 mmol/L (ref 3.5–5.1)
Sodium: 137 mmol/L (ref 135–145)
Total Bilirubin: 0.6 mg/dL (ref 0.3–1.2)
Total Protein: 7.8 g/dL (ref 6.5–8.1)

## 2020-02-09 LAB — LIPID PANEL
Cholesterol: 169 mg/dL (ref 0–200)
HDL: 39 mg/dL — ABNORMAL LOW (ref >40)
LDL Cholesterol: 93 mg/dL (ref 0–99)
Total CHOL/HDL Ratio: 4.3 RATIO
Triglycerides: 186 mg/dL — ABNORMAL HIGH (ref <150)
VLDL: 37 mg/dL (ref 0–40)

## 2020-02-09 LAB — HEMOGLOBIN A1C
Hgb A1c MFr Bld: 10.5 % — ABNORMAL HIGH (ref 4.8–5.6)
Mean Plasma Glucose: 254.65 mg/dL

## 2020-02-09 NOTE — Progress Notes (Signed)
BP (!) 160/70   Pulse 86   Temp 97.6 F (36.4 C)   Ht 5\' 4"  (1.626 m)   Wt 201 lb 9.6 oz (91.4 kg)   SpO2 97%   BMI 34.60 kg/m    Subjective:    Patient ID: Casey Johnston, female    DOB: Oct 28, 1960, 59 y.o.   MRN: [id number]  HPI: Casey Johnston is a 59 y.o. female presenting on 02/09/2020 for Diabetes, Foot Swelling, Joint Swelling, and Shoulder Pain   HPI   Pt had a negative covid 19 screening questionnaire.    Pt is a 59yoF with DM, HTN and dyslipidemia who has appointment today for routine follow up.  She is also having concerns about feet swelling, L wrist swelling and bilateral shoulder pain.  Pt says she checks her bp at home some times.  She says the bp is running 130/65 last time she checked it at home (last week).  She thinks it is high today because she hasn't taken her meds yet today (her appt was scheduled for 2:30pm) and she says she has been running around doing errands today since she took the day off work.   Pt says left wrist hurts since thursday or friday.  No injury.  She says it Only hurts if she is using it sometimes.  Pt is right hand dominant.  She does use both hands at work.  She says she does a lot of repetitive movements with her hands at work.  She says Both shoulders hurt and she has limited ROM.  This has been going on for about a month.  She says Both feet are swelling.  This has been going on for about 3-4 weeks.  She says it happens when she works.  She says she has no swelling today.    She has had No changes with her breathing.    She says her Stress level is a bit less now that she is working.  She is still hoping to get FullTime and benefits but she doesn't have any idea how long that will be before it will happen.  She is still smoking.  She has still not gotten the covid vaccination.      Relevant past medical, surgical, family and social history reviewed and updated as indicated. Interim medical history since our last  visit reviewed. Allergies and medications reviewed and updated.   Current Outpatient Medications:  .  amLODipine (NORVASC) 10 MG tablet, TAKE 1 Tablet BY MOUTH ONCE DAILY, Disp: 90 tablet, Rfl: 0 .  atorvastatin (LIPITOR) 20 MG tablet, TAKE 1 Tablet BY MOUTH ONCE DAILY (Patient taking differently: Take 20 mg by mouth daily. ), Disp: 90 tablet, Rfl: 2 .  cloNIDine (CATAPRES) 0.1 MG tablet, TAKE 1 Tablet  BY MOUTH TWICE DAILY, Disp: 180 tablet, Rfl: 0 .  insulin glargine (LANTUS) 100 UNIT/ML injection, Inject 60 Units into the skin at bedtime. , Disp: , Rfl:  .  losartan (COZAAR) 100 MG tablet, TAKE 1 Tablet BY MOUTH ONCE EVERY DAY, Disp: 90 tablet, Rfl: 1 .  lubiprostone (AMITIZA) 24 MCG capsule, 1 PO QHS FOR 7 DAYS THEN 1 PO BID. TAKE WITH FOOD. (Patient taking differently: Take 24 mcg by mouth as needed. ), Disp: 62 capsule, Rfl: 11 .  metFORMIN (GLUCOPHAGE) 1000 MG tablet, TAKE 1 Tablet  BY MOUTH TWICE DAILY WITH A MEAL, Disp: 180 tablet, Rfl: 0 .  metoprolol tartrate (LOPRESSOR) 100 MG tablet, TAKE 1 Tablet  BY MOUTH TWICE  DAILY, Disp: 180 tablet, Rfl: 0 .  Omega-3 Fatty Acids (FISH OIL) 1000 MG CAPS, Take 1,000 mg by mouth daily., Disp: , Rfl:  .  pantoprazole (PROTONIX) 40 MG tablet, Take 1 tablet (40 mg total) by mouth daily. TAKE 1 TABLET BY MOUTH TWICE DAILY BEFORE A MEAL (Patient taking differently: Take 40 mg by mouth daily. ), Disp: 30 tablet, Rfl: 5 .  PROVENTIL HFA 108 (90 Base) MCG/ACT inhaler, INHALE 2 PUFFS BY MOUTH EVERY 6 HOURS AS NEEDED FOR COUGHING, WHEEZING, OR SHORTNESS OF BREATH, Disp: 20.1 g, Rfl: 1    Review of Systems  Per HPI unless specifically indicated above     Objective:    BP (!) 160/70   Pulse 86   Temp 97.6 F (36.4 C)   Ht 5\' 4"  (1.626 m)   Wt 201 lb 9.6 oz (91.4 kg)   SpO2 97%   BMI 34.60 kg/m   Wt Readings from Last 3 Encounters:  02/09/20 201 lb 9.6 oz (91.4 kg)  12/23/19 199 lb 6.4 oz (90.4 kg)  11/11/19 195 lb 4 oz (88.6 kg)    Physical  Exam Vitals reviewed.  Constitutional:      General: She is not in acute distress.    Appearance: She is well-developed. She is obese. She is not ill-appearing.  HENT:     Head: Normocephalic and atraumatic.  Cardiovascular:     Rate and Rhythm: Normal rate and regular rhythm.     Pulses:          Dorsalis pedis pulses are 2+ on the right side and 2+ on the left side.  Pulmonary:     Effort: Pulmonary effort is normal.     Breath sounds: Normal breath sounds.  Abdominal:     General: Bowel sounds are normal.     Palpations: Abdomen is soft. There is no mass.     Tenderness: There is no abdominal tenderness.  Musculoskeletal:     Right shoulder: No deformity or tenderness. Normal range of motion.     Left shoulder: Tenderness present. No deformity. Decreased range of motion.     Left wrist: No swelling, deformity or tenderness. Normal range of motion. Normal pulse.     Left hand: Normal.     Cervical back: Neck supple.     Right lower leg: No tenderness. No edema.     Left lower leg: No tenderness. No edema.     Comments: -Pt indicates "knot" comes over muscle overlying radius approx 1 1/2 inches proximal to the wrist.  There is no knot at exam. -L shoulder with pain on abduction past 90 degress.  Significantly diminished ability to internally rotate.  Mild non-point tenderness.  No deformity or swelling.  Lymphadenopathy:     Cervical: No cervical adenopathy.  Skin:    General: Skin is warm and dry.  Neurological:     Mental Status: She is alert and oriented to person, place, and time.  Psychiatric:        Behavior: Behavior normal.     Results for orders placed or performed during the hospital encounter of 02/09/20  Comprehensive metabolic panel  Result Value Ref Range   Sodium 137 135 - 145 mmol/L   Potassium 3.8 3.5 - 5.1 mmol/L   Chloride 101 98 - 111 mmol/L   CO2 27 22 - 32 mmol/L   Glucose, Bld 275 (H) 70 - 99 mg/dL   BUN 11 6 - 20 mg/dL   Creatinine, Ser 0.65  0.44 - 1.00 mg/dL   Calcium 9.3 8.9 - 10.3 mg/dL   Total Protein 7.8 6.5 - 8.1 g/dL   Albumin 3.7 3.5 - 5.0 g/dL   AST 15 15 - 41 U/L   ALT 13 0 - 44 U/L   Alkaline Phosphatase 83 38 - 126 U/L   Total Bilirubin 0.6 0.3 - 1.2 mg/dL   GFR calc non Af Amer >60 >60 mL/min   GFR calc Af Amer >60 >60 mL/min   Anion gap 9 5 - 15  Lipid panel  Result Value Ref Range   Cholesterol 169 0 - 200 mg/dL   Triglycerides 186 (H) <150 mg/dL   HDL 39 (L) >40 mg/dL   Total CHOL/HDL Ratio 4.3 RATIO   VLDL 37 0 - 40 mg/dL   LDL Cholesterol 93 0 - 99 mg/dL      Assessment & Plan:    Encounter Diagnoses  Name Primary?  Marland Kitchen Uncontrolled type 2 diabetes mellitus with hyperglycemia ([place]) Yes  . Essential hypertension, benign   . Hyperlipidemia, unspecified hyperlipidemia type   . Chronic left shoulder pain   . Chronic right shoulder pain   . Tobacco use disorder   . Obesity, unspecified classification, unspecified obesity type, unspecified whether serious comorbidity present   . Left wrist pain       -cmp and lpids reviewed with pt.  a1c still pending.  Will call her with that result  -educated and encouarged covid vaccination  -refer for annual DM eye exam  -discussed with pt that she needs to monitor her bp at home.  She is to contact office if it is over 140.  Also discussed that she shouldn't skip her BP meds to get her labs drawn.  -will send for Left shoulder xray.  Refer to Orthopedics for her shoulders as she may need MRI or benefit from joint injection.  Recommended ice 10-20 minutes several times/day.  She can use OTC analgesic if desired  -Pt is given cafa/ [facility] financial assistance application  -discussed with pt that her wrist issue is likely a muscle spasm which comes under heavy use.  Recommended heat.  She can use OTC analgesic if needed.  Discussed that repetitive motions at work can increase risks of this.   Discussed resting this wrist when not at work to reduce risk  of progression to tendonitis  -discussed with pt that the swelling she has in her feet is not present today because she hasn't been standing all day like she does when she is at work.  Discussed things that can help like wearing good supportive shoes, standing on a mat (instead of hard floor), regular exercise like walking, weight loss and smoking cessation.  encouraged her to prop up her feet when she gets home from work to reduce swelling.   Discussed lasix but will not start that at this time.  -pt to follow up 3 months.  She is to contact office sooner prn

## 2020-02-15 ENCOUNTER — Ambulatory Visit: Payer: HRSA Program | Admitting: Physician Assistant

## 2020-02-29 ENCOUNTER — Telehealth: Payer: Self-pay | Admitting: Internal Medicine

## 2020-02-29 NOTE — Telephone Encounter (Signed)
Pt called back.  Linzess 145 mcg samples left up front along with pt assistance forms for pt.

## 2020-02-29 NOTE — Telephone Encounter (Signed)
Lmom for pt to call us back. 

## 2020-02-29 NOTE — Telephone Encounter (Signed)
Patient called asking for linzess samples ?

## 2020-04-18 ENCOUNTER — Ambulatory Visit: Payer: Self-pay

## 2020-04-18 ENCOUNTER — Ambulatory Visit (INDEPENDENT_AMBULATORY_CARE_PROVIDER_SITE_OTHER): Payer: Self-pay | Admitting: Orthopedic Surgery

## 2020-04-18 ENCOUNTER — Encounter: Payer: Self-pay | Admitting: Orthopedic Surgery

## 2020-04-18 ENCOUNTER — Other Ambulatory Visit: Payer: Self-pay

## 2020-04-18 VITALS — BP 131/70 | HR 67 | Ht 64.0 in | Wt 204.0 lb

## 2020-04-18 DIAGNOSIS — G8929 Other chronic pain: Secondary | ICD-10-CM

## 2020-04-18 DIAGNOSIS — M25512 Pain in left shoulder: Secondary | ICD-10-CM

## 2020-04-18 DIAGNOSIS — M7582 Other shoulder lesions, left shoulder: Secondary | ICD-10-CM

## 2020-04-18 DIAGNOSIS — M7581 Other shoulder lesions, right shoulder: Secondary | ICD-10-CM

## 2020-04-18 DIAGNOSIS — M25511 Pain in right shoulder: Secondary | ICD-10-CM

## 2020-04-18 MED ORDER — MELOXICAM 7.5 MG PO TABS: 7.5000 mg | ORAL_TABLET | Freq: Every day | ORAL | 5 refills | Status: DC

## 2020-04-18 NOTE — Patient Instructions (Addendum)
Schedule therapy both shoulders  Shoulder Impingement Syndrome  Shoulder impingement syndrome is a condition that causes pain when connective tissues (tendons) surrounding the shoulder joint become pinched. These tendons are part of the group of muscles and tissues that help to stabilize the shoulder (rotator cuff). Beneath the rotator cuff is a fluid-filled sac (bursa) that allows the muscles and tendons to glide smoothly. The bursa may become swollen or irritated (bursitis). Bursitis, swelling in the rotator cuff tendons, or both conditions can decrease how much space is under a bone in the shoulder joint (acromion), resulting in impingement. What are the causes? Shoulder impingement syndrome may be caused by bursitis or swelling of the rotator cuff tendons, which may result from:  Repetitive overhead arm movements.  Falling onto the shoulder.  Weakness in the shoulder muscles. What increases the risk? You may be more likely to develop this condition if you:  Play sports that involve throwing, such as baseball.  Participate in sports such as tennis, volleyball, and swimming.  Work as a Curator, Games developer, or Architect. Some people are also more likely to develop impingement syndrome because of the shape of their acromion bone. What are the signs or symptoms? The main symptom of this condition is pain on the front or side of the shoulder. The pain may:  Get worse when lifting or raising the arm.  Get worse at night.  Wake you up from sleeping.  Feel sharp when the shoulder is moved and then fade to an ache. Other symptoms may include:  Tenderness.  Stiffness.  Inability to raise the arm above shoulder level or behind the body.  Weakness. How is this diagnosed? This condition may be diagnosed based on:  Your symptoms and medical history.  A physical exam.  Imaging tests, such as: ? X-rays. ? MRI. ? Ultrasound. How is this treated? This condition may be treated  by:  Resting your shoulder and avoiding all activities that cause pain or put stress on the shoulder.  Icing your shoulder.  NSAIDs to help reduce pain and swelling.  One or more injections of medicines to numb the area and reduce inflammation.  Physical therapy.  Surgery. This may be needed if nonsurgical treatments have not helped. Surgery may involve repairing the rotator cuff, reshaping the acromion, or removing the bursa. Follow these instructions at home: Managing pain, stiffness, and swelling   If directed, put ice on the injured area. ? Put ice in a plastic bag. ? Place a towel between your skin and the bag. ? Leave the ice on for 20 minutes, 2-3 times a day. Activity  Rest and return to your normal activities as told by your health care provider. Ask your health care provider what activities are safe for you.  Do exercises as told by your health care provider. General instructions  Do not use any products that contain nicotine or tobacco, such as cigarettes, e-cigarettes, and chewing tobacco. These can delay healing. If you need help quitting, ask your health care provider.  Ask your health care provider when it is safe for you to drive.  Take over-the-counter and prescription medicines only as told by your health care provider.  Keep all follow-up visits as told by your health care provider. This is important. How is this prevented?  Give your body time to rest between periods of activity.  Be safe and responsible while being active. This will help you avoid falls.  Maintain physical fitness, including strength and flexibility. Contact a health  care provider if:  Your symptoms have not improved after 1-2 months of treatment and rest.  You cannot lift your arm away from your body. Summary  Shoulder impingement syndrome is a condition that causes pain when connective tissues (tendons) surrounding the shoulder joint become pinched.  The main symptom of this  condition is pain on the front or side of the shoulder.  This condition is usually treated with rest, ice, and pain medicines as needed. This information is not intended to replace advice given to you by your health care provider. Make sure you discuss any questions you have with your health care provider. Document Revised: 08/29/2018 Document Reviewed: 10/30/2017 Elsevier Patient Education  2020 [employer].

## 2020-04-18 NOTE — Progress Notes (Signed)
Chief Complaint  Patient presents with   Shoulder Pain    bilateral, for 2 months, denies any injury or fall   59 year old female with diabetes hypertension presents with a 90-day history of bilateral shoulder pain with no trauma.  She is fine at rest but if she lifts her arms over her head she feels pain and she has trouble getting dressed when she has to put her arm behind her back  No prior treatment she did try some ibuprofen it didn't help  System reviewed no neck pain numbness or tingling in either arm no weakness in the shoulders  Past Medical History:  Diagnosis Date   Anxiety and depression    Diabetes mellitus, type II ([place])    Dr. Dorris Fetch managing   Frequent headaches chronic   ibuprofen helps some   History of UTI    Recurrent per pt (approx 3 per year)   Hyperlipidemia    Hypertension    Leukocytosis 12/2015   mild lymphocytosis.  Path smear review reassuring.  Repeat 01/2016 stable.   Morbid obesity ([place])    OSA on CPAP    Past Surgical History:  Procedure Laterality Date   ABDOMINAL HYSTERECTOMY  1998   Dysfunctional uterine bleeding.  No hx of abnormal paps.   BIOPSY  10/20/2018   Procedure: BIOPSY;  Surgeon: Danie Binder, MD;  [facility];  Service: Endoscopy;;  stomach   COLONOSCOPY  2012   Dr. Earley Brooke: 1.5 cm pendunculated descending colon polyp (tubulovillous adenoma) and diminutive hyperplastic polyps   COLONOSCOPY N/A 10/20/2018   Procedure: COLONOSCOPY;  Surgeon: Danie Binder, MD; 4 tubular adenomas, diverticulosis in the sigmoid colon, tortuous left colon, external and internal hemorrhoids. Due for repeat in 2023.    ESOPHAGOGASTRODUODENOSCOPY N/A 10/20/2018   Procedure: ESOPHAGOGASTRODUODENOSCOPY (EGD);  Surgeon: Danie Binder, MD;  gastritis and duodenitis. No H. Pylori.    POLYPECTOMY  10/20/2018   Procedure: POLYPECTOMY;  Surgeon: Danie Binder, MD;  [facility];  Service: Endoscopy;;   wart removal  08/04/2019    benign   Family History  Problem Relation Age of Onset   Cancer Mother        ovarian   COPD Father    Diabetes Father    Colon cancer Neg Hx    Social History   Tobacco Use   Smoking status: Current Every Day Smoker    Packs/day: 0.25    Years: 20.00    Pack years: 5.00    Types: Cigarettes   Smokeless tobacco: Never Used  Vaping Use   Vaping Use: Never used  Substance Use Topics   Alcohol use: No   Drug use: No    BP 131/70    Pulse 67    Ht 5\' 4"  (1.626 m)    Wt 204 lb (92.5 kg)    BMI 35.02 kg/m   Her appearance is normal  She is oriented x3  Mood is pleasant  Affect is normal  Gait unremarkable  She has good pulses in her upper extremities  Both shoulders show no lymphadenopathy in the axilla or supraclavicular region  Sensation is normal bilaterally  Her skin looks good on the shoulder right and left  She has tenderness around the deltoid bilaterally primarily laterally and anteriorly her shoulder range of motion shows 150 degrees of forward elevation which is painful in each shoulder as is abduction to 90 shoulders are stable strength in the cuff is normal bilaterally  We did  take to x-rays showed no glenohumeral arthritis  Encounter Diagnoses  Name Primary?   Chronic left shoulder pain Yes   Chronic pain in right shoulder    Tendonitis of both rotator cuffs     Recommend physical therapy 8-week follow-up Following medication  Meds ordered this encounter  Medications   meloxicam (MOBIC) 7.5 MG tablet    Sig: Take 1 tablet (7.5 mg total) by mouth daily.    Dispense:  30 tablet    Refill:  5

## 2020-04-20 ENCOUNTER — Other Ambulatory Visit: Payer: Self-pay | Admitting: Physician Assistant

## 2020-04-20 DIAGNOSIS — I10 Essential (primary) hypertension: Secondary | ICD-10-CM

## 2020-04-20 DIAGNOSIS — E1165 Type 2 diabetes mellitus with hyperglycemia: Secondary | ICD-10-CM

## 2020-04-20 DIAGNOSIS — E785 Hyperlipidemia, unspecified: Secondary | ICD-10-CM

## 2020-04-29 ENCOUNTER — Other Ambulatory Visit: Payer: Self-pay

## 2020-04-29 ENCOUNTER — Other Ambulatory Visit (HOSPITAL_COMMUNITY)
Admission: RE | Admit: 2020-04-29 | Discharge: 2020-04-29 | Disposition: A | Discharge status: Home / Self Care | Payer: Self-pay | Source: Ambulatory Visit | Attending: Physician Assistant | Admitting: Physician Assistant

## 2020-04-29 DIAGNOSIS — I10 Essential (primary) hypertension: Secondary | ICD-10-CM | POA: Insufficient documentation

## 2020-04-29 DIAGNOSIS — E785 Hyperlipidemia, unspecified: Secondary | ICD-10-CM | POA: Insufficient documentation

## 2020-04-29 DIAGNOSIS — E1165 Type 2 diabetes mellitus with hyperglycemia: Secondary | ICD-10-CM | POA: Insufficient documentation

## 2020-04-29 LAB — COMPREHENSIVE METABOLIC PANEL
ALT: 13 U/L (ref 0–44)
AST: 16 U/L (ref 15–41)
Albumin: 3.8 g/dL (ref 3.5–5.0)
Alkaline Phosphatase: 87 U/L (ref 38–126)
Anion gap: 10 (ref 5–15)
BUN: 13 mg/dL (ref 6–20)
CO2: 27 mmol/L (ref 22–32)
Calcium: 9.1 mg/dL (ref 8.9–10.3)
Chloride: 99 mmol/L (ref 98–111)
Creatinine, Ser: 0.71 mg/dL (ref 0.44–1.00)
GFR, Estimated: >60 mL/min (ref >60)
Glucose, Bld: 171 mg/dL — ABNORMAL HIGH (ref 70–99)
Potassium: 3.5 mmol/L (ref 3.5–5.1)
Sodium: 136 mmol/L (ref 135–145)
Total Bilirubin: 0.4 mg/dL (ref 0.3–1.2)
Total Protein: 8.2 g/dL — ABNORMAL HIGH (ref 6.5–8.1)

## 2020-04-29 LAB — LIPID PANEL
Cholesterol: 145 mg/dL (ref 0–200)
HDL: 41 mg/dL (ref >40)
LDL Cholesterol: 73 mg/dL (ref 0–99)
Total CHOL/HDL Ratio: 3.5 RATIO
Triglycerides: 155 mg/dL — ABNORMAL HIGH (ref <150)
VLDL: 31 mg/dL (ref 0–40)

## 2020-04-30 LAB — HEMOGLOBIN A1C
Hgb A1c MFr Bld: 10.8 % — ABNORMAL HIGH (ref 4.8–5.6)
Mean Plasma Glucose: 263.26 mg/dL

## 2020-04-30 LAB — MICROALBUMIN, URINE: Microalb, Ur: 154.7 ug/mL — ABNORMAL HIGH

## 2020-05-04 ENCOUNTER — Ambulatory Visit (HOSPITAL_COMMUNITY): Payer: Self-pay | Attending: Orthopedic Surgery

## 2020-05-04 ENCOUNTER — Ambulatory Visit: Payer: HRSA Program | Admitting: Physician Assistant

## 2020-05-04 ENCOUNTER — Encounter (HOSPITAL_COMMUNITY): Payer: Self-pay

## 2020-05-04 ENCOUNTER — Encounter: Payer: Self-pay | Admitting: Physician Assistant

## 2020-05-04 ENCOUNTER — Other Ambulatory Visit: Payer: Self-pay

## 2020-05-04 VITALS — BP 106/56 | HR 62 | Temp 97.6°F | Ht 64.0 in | Wt 208.3 lb

## 2020-05-04 DIAGNOSIS — M25511 Pain in right shoulder: Secondary | ICD-10-CM

## 2020-05-04 DIAGNOSIS — F17200 Nicotine dependence, unspecified, uncomplicated: Secondary | ICD-10-CM

## 2020-05-04 DIAGNOSIS — E1165 Type 2 diabetes mellitus with hyperglycemia: Secondary | ICD-10-CM

## 2020-05-04 DIAGNOSIS — M25612 Stiffness of left shoulder, not elsewhere classified: Secondary | ICD-10-CM | POA: Insufficient documentation

## 2020-05-04 DIAGNOSIS — M25512 Pain in left shoulder: Secondary | ICD-10-CM

## 2020-05-04 DIAGNOSIS — I10 Essential (primary) hypertension: Secondary | ICD-10-CM

## 2020-05-04 DIAGNOSIS — E785 Hyperlipidemia, unspecified: Secondary | ICD-10-CM

## 2020-05-04 DIAGNOSIS — E669 Obesity, unspecified: Secondary | ICD-10-CM

## 2020-05-04 DIAGNOSIS — M25611 Stiffness of right shoulder, not elsewhere classified: Secondary | ICD-10-CM | POA: Insufficient documentation

## 2020-05-04 DIAGNOSIS — R109 Unspecified abdominal pain: Secondary | ICD-10-CM

## 2020-05-04 MED ORDER — INSULIN ASPART 100 UNIT/ML ~~LOC~~ SOLN: SUBCUTANEOUS | 99 refills | Status: DC

## 2020-05-04 NOTE — Patient Instructions (Signed)
Scapular Retraction (Standing)   With arms at sides, pinch shoulder blades together. Repeat __10__ times per set. Do __1__ sets per session. Do __1__ sessions per day.    1) SHOULDER: Flexion On Table   Place hands on towel placed on table, elbows straight. Lean forward with you upper body, pushing towel away from body.  _15-20__ reps per set, __1_ sets per day  2) Abduction (Passive)   With arm out to side, resting on towel placed on table with palm DOWN, keeping trunk away from table, lean to the side while pushing towel away from body.  Repeat _15-20___ times. Do __1__ sessions per day.  Copyright  VHI. All rights reserved.     3) Internal Rotation (Assistive)   Seated with elbow bent at right angle and held against side, slide arm on table surface in an inward arc keeping elbow anchored in place. Repeat _15-20___ times. Do __1__ sessions per day. Activity: Use this motion to brush crumbs off the table.  Copyright  VHI. All rights reserved.

## 2020-05-04 NOTE — Progress Notes (Signed)
BP (!) 106/56    Pulse 62    Temp 97.6 F (36.4 C)    Ht 5\' 4"  (1.626 m)    Wt 208 lb 4.8 oz (94.5 kg)    SpO2 97%    BMI 35.75 kg/m    Subjective:    Patient ID: Casey Johnston, female    DOB: 22-Feb-1961, 59 y.o.   MRN: [id number]  HPI: Casey Johnston is a 59 y.o. female presenting on 05/04/2020 for No chief complaint on file.   HPI  Pt had a negative covid 19 screening questionnaire.   pt is 72yoF who presents for routine follow up of DM, HTN, dyslipidemia.  She has appt later today for rehab for the shoulder.  She says Her stomach is "messing up again" so she hasn't eaten yet today.  She isn't eating regularly.  She says that Eating makes her nauseaous.  She is being followed by GI and has appointment to return there in early February.   She says that her symptoms are not as severe as they used to be.    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    She has not gotten the covid vaccination.  She says she is still scared of it.  She says her mood is doing okay.  It is helping that she is working.   Relevant past medical, surgical, family and social history reviewed and updated as indicated. Interim medical history since our last visit reviewed. Allergies and medications reviewed and updated.    Current Outpatient Medications:    amLODipine (NORVASC) 10 MG tablet, TAKE 1 Tablet BY MOUTH ONCE DAILY, Disp: 90 tablet, Rfl: 0   atorvastatin (LIPITOR) 20 MG tablet, TAKE 1 Tablet BY MOUTH ONCE DAILY, Disp: 90 tablet, Rfl: 0   cloNIDine (CATAPRES) 0.1 MG tablet, TAKE 1 Tablet  BY MOUTH TWICE DAILY, Disp: 180 tablet, Rfl: 0   insulin glargine (LANTUS) 100 UNIT/ML injection, Inject 70 Units into the skin at bedtime., Disp: , Rfl:    losartan (COZAAR) 100 MG tablet, TAKE 1 Tablet BY MOUTH ONCE EVERY DAY, Disp: 90 tablet, Rfl: 1   lubiprostone (AMITIZA) 24 MCG capsule, 1 PO QHS FOR 7 DAYS THEN 1 PO BID. TAKE WITH  FOOD. (Patient taking differently: Take 24 mcg by mouth as needed.), Disp: 62 capsule, Rfl: 11   meloxicam (MOBIC) 7.5 MG tablet, Take 1 tablet (7.5 mg total) by mouth daily., Disp: 30 tablet, Rfl: 5   metFORMIN (GLUCOPHAGE) 1000 MG tablet, TAKE 1 Tablet  BY MOUTH TWICE DAILY WITH A MEAL, Disp: 180 tablet, Rfl: 0   metoprolol tartrate (LOPRESSOR) 100 MG tablet, TAKE 1 Tablet  BY MOUTH TWICE DAILY, Disp: 180 tablet, Rfl: 0   Omega-3 Fatty Acids (FISH OIL) 1000 MG CAPS, Take 1,000 mg by mouth daily., Disp: , Rfl:    pantoprazole (PROTONIX) 40 MG tablet, Take 1 tablet (40 mg total) by mouth daily. TAKE 1 TABLET BY MOUTH TWICE DAILY BEFORE A MEAL (Patient taking differently: Take 40 mg by mouth daily.), Disp: 30 tablet, Rfl: 5   PROVENTIL HFA 108 (90 Base) MCG/ACT inhaler, INHALE 2 PUFFS BY MOUTH EVERY 6 HOURS AS NEEDED FOR COUGHING, WHEEZING, OR SHORTNESS OF BREATH, Disp: 20.1 g, Rfl: 1     Review of Systems  Per HPI unless specifically indicated above     Objective:    BP (!) 106/56    Pulse 62  Temp 97.6 F (36.4 C)    Ht 5\' 4"  (1.626 m)    Wt 208 lb 4.8 oz (94.5 kg)    SpO2 97%    BMI 35.75 kg/m   Wt Readings from Last 3 Encounters:  05/04/20 208 lb 4.8 oz (94.5 kg)  04/18/20 204 lb (92.5 kg)  02/09/20 201 lb 9.6 oz (91.4 kg)    Physical Exam Vitals reviewed.  Constitutional:      Appearance: She is well-developed and well-nourished.  HENT:     Head: Normocephalic and atraumatic.  Cardiovascular:     Rate and Rhythm: Normal rate and regular rhythm.  Pulmonary:     Effort: Pulmonary effort is normal.     Breath sounds: Normal breath sounds.  Abdominal:     General: Bowel sounds are normal.     Palpations: Abdomen is soft. There is no hepatosplenomegaly or mass.     Tenderness: There is no abdominal tenderness.  Musculoskeletal:        General: No edema.     Cervical back: Neck supple.     Right lower leg: No edema.     Left lower leg: No edema.  Lymphadenopathy:      Cervical: No cervical adenopathy.  Skin:    General: Skin is warm and dry.  Neurological:     Mental Status: She is alert and oriented to person, place, and time.  Psychiatric:        Mood and Affect: Mood and affect normal.        Behavior: Behavior normal.     Results for orders placed or performed during the hospital encounter of 04/29/20  Lipid panel  Result Value Ref Range   Cholesterol 145 0 - 200 mg/dL   Triglycerides 155 (H) <150 mg/dL   HDL 41 >40 mg/dL   Total CHOL/HDL Ratio 3.5 RATIO   VLDL 31 0 - 40 mg/dL   LDL Cholesterol 73 0 - 99 mg/dL  Hemoglobin A1c  Result Value Ref Range   Hgb A1c MFr Bld 10.8 (H) 4.8 - 5.6 %   Mean Plasma Glucose 263.26 mg/dL  Comprehensive metabolic panel  Result Value Ref Range   Sodium 136 135 - 145 mmol/L   Potassium 3.5 3.5 - 5.1 mmol/L   Chloride 99 98 - 111 mmol/L   CO2 27 22 - 32 mmol/L   Glucose, Bld 171 (H) 70 - 99 mg/dL   BUN 13 6 - 20 mg/dL   Creatinine, Ser 0.71 0.44 - 1.00 mg/dL   Calcium 9.1 8.9 - 10.3 mg/dL   Total Protein 8.2 (H) 6.5 - 8.1 g/dL   Albumin 3.8 3.5 - 5.0 g/dL   AST 16 15 - 41 U/L   ALT 13 0 - 44 U/L   Alkaline Phosphatase 87 38 - 126 U/L   Total Bilirubin 0.4 0.3 - 1.2 mg/dL   GFR, Estimated >60 >60 mL/min   Anion gap 10 5 - 15  Microalbumin, urine  Result Value Ref Range   Microalb, Ur 154.7 (H) Not Estab. ug/mL      Assessment & Plan:    Encounter Diagnoses  Name Primary?   Uncontrolled type 2 diabetes mellitus with hyperglycemia ([facility]) Yes   Essential hypertension, benign    Hyperlipidemia, unspecified hyperlipidemia type    Tobacco use disorder    Obesity, unspecified classification, unspecified obesity type, unspecified whether serious comorbidity present    Chronic left shoulder pain    Chronic right shoulder pain    Chronic  abdominal pain       -reviewed labs with pt  - Encouraged her to avoid skipping meals.   -Add mealtime insulin with apidra (gave sample novolog  until her supply is delivered) -pt to [place] same lantus dosage -will Refer back to dr Dorris Fetch.  She has seen him in the past but has not been there since 2018.   -she is to monitor her blood sugars and bring log with her to next appointment.  She is reminded to call office for fbs < 70 or > 300  -will refer for annual DM eye exam  -educated pt on covid vaccination and encouraged her to get vaccinated  -pt to continue with orthopedics for her chronic shoulder pain  -pt to continue with GI for her chronic abdominal pain  -pt to continue current medications.  Only change is addition of rapid acting insulin  -pt to follow up 4 weeks with bs log.  She is to contact office sooner prn

## 2020-05-05 ENCOUNTER — Telehealth (HOSPITAL_COMMUNITY): Payer: Self-pay

## 2020-05-05 ENCOUNTER — Encounter (HOSPITAL_COMMUNITY): Payer: Self-pay

## 2020-05-05 NOTE — Therapy (Addendum)
[place], [place], [address] Phone: [phone]   Fax:  [phone]  Occupational Therapy Evaluation  Patient Details  Name: Casey Johnston MRN: [id number] Date of Birth: March 19, 1961 Referring Provider (OT): Arther Abbott, MD   Encounter Date: 05/04/2020   OT End of Session - 05/05/20 1049     Visit Number 1    Number of Visits 8    Date for OT Re-Evaluation 06/01/20    Authorization Type [facility] financial assistance will cover 75%    Authorization Time Period 02/09/20-08/08/20    OT Start Time 1602    OT Stop Time 1641    OT Time Calculation (min) 39 min    Activity Tolerance Patient tolerated treatment well    Behavior During Therapy [facility] for tasks assessed/performed             Past Medical History:  Diagnosis Date   Anxiety and depression    Diabetes mellitus, type II ([place])    Dr. Dorris Fetch managing   Frequent headaches chronic   ibuprofen helps some   History of UTI    Recurrent per pt (approx 3 per year)   Hyperlipidemia    Hypertension    Leukocytosis 12/2015   mild lymphocytosis.  Path smear review reassuring.  Repeat 01/2016 stable.   Morbid obesity ([place])    OSA on CPAP     Past Surgical History:  Procedure Laterality Date   ABDOMINAL HYSTERECTOMY  1998   Dysfunctional uterine bleeding.  No hx of abnormal paps.   BIOPSY  10/20/2018   Procedure: BIOPSY;  Surgeon: Danie Binder, MD;  [facility];  Service: Endoscopy;;  stomach   COLONOSCOPY  2012   Dr. Earley Brooke: 1.5 cm pendunculated descending colon polyp (tubulovillous adenoma) and diminutive hyperplastic polyps   COLONOSCOPY N/A 10/20/2018   Procedure: COLONOSCOPY;  Surgeon: Danie Binder, MD; 4 tubular adenomas, diverticulosis in the sigmoid colon, tortuous left colon, external and internal hemorrhoids. Due for repeat in 2023.    ESOPHAGOGASTRODUODENOSCOPY N/A 10/20/2018   Procedure: ESOPHAGOGASTRODUODENOSCOPY (EGD);   Surgeon: Danie Binder, MD;  gastritis and duodenitis. No H. Pylori.    POLYPECTOMY  10/20/2018   Procedure: POLYPECTOMY;  Surgeon: Danie Binder, MD;  [facility];  Service: Endoscopy;;   wart removal  08/04/2019   benign    There were no vitals filed for this visit.   Subjective Assessment - 05/04/20 1607     Subjective  S: They both started hurting for no reason.    Pertinent History Patient is a 59 y/o female S/P bilateral shoulder pain which started approximately 2-3 months ago. X-rays completed and negative for any concerns. No injury reported for cause. Dr. Aline Brochure has referred patient to occuptional therapy for evaluation and treatment.    Patient Stated Goals To decrease pain and ROM in both arms.    Currently in Pain? Yes    Pain Score 3     Pain Location Shoulder    Pain Orientation Right;Left    Pain Descriptors / Indicators Sore    Pain Type Acute pain    Pain Radiating Towards N/A    Pain Onset More than a month ago    Pain Frequency Occasional    Aggravating Factors  Movement    Pain Relieving Factors rest    Effect of Pain on Daily Activities severe effect    Multiple Pain Sites No  [facility] OT Assessment - 05/04/20 1609       Assessment   Medical Diagnosis right and left shoulder pain    Referring Provider (OT) Arther Abbott, MD    Onset Date/Surgical Date --   approximately 2 months ago   Hand Dominance Right    Next MD Visit 06/16/20    Prior Therapy None      Precautions   Precautions None      Restrictions   Weight Bearing Restrictions No      Balance Screen   Has the patient fallen in the past 6 months No      Home  Environment   Family/patient expects to be discharged to: Private residence      Prior Function   Level of Independence Independent    Vocation Full time employment    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      Mobility   Mobility Status Independent      Vision - History   Baseline Vision Wears glasses all the time      Cognition   Overall Cognitive Status Within Functional Limits for tasks assessed      Observation/Other Assessments   Focus on Therapeutic Outcomes (FOTO)  complete next session      Posture/Postural Control   Posture/Postural Control Postural limitations    Postural Limitations Rounded Shoulders;Forward head      ROM / Strength   AROM / PROM / Strength AROM;PROM;Strength      Palpation   Palpation comment No fascial restrictions noted in the right upper arm, trapezius, or scapularis region. Min fascial restrictions noted in the left upper arm and upper trapezius region.      AROM   Overall AROM Comments Assessed seated. IR/er adducted    AROM Assessment Site Shoulder    Right/Left Shoulder Right;Left    Right Shoulder Flexion 110 Degrees    Right Shoulder ABduction 108 Degrees    Right Shoulder Internal Rotation 90 Degrees    Right Shoulder External Rotation 65 Degrees    Left Shoulder Flexion 110 Degrees    Left Shoulder ABduction 94 Degrees    Left Shoulder Internal Rotation 90 Degrees    Left Shoulder External Rotation 40 Degrees      PROM   Overall PROM Comments Assessed supine. IR/er adducted    PROM Assessment Site Shoulder    Right/Left Shoulder Right;Left    Right Shoulder Flexion 150 Degrees    Right Shoulder ABduction 170 Degrees    Right Shoulder Internal Rotation 90 Degrees    Right Shoulder External Rotation 88 Degrees    Left Shoulder Flexion 135 Degrees    Left Shoulder ABduction 121 Degrees    Left Shoulder Internal Rotation 90 Degrees    Left Shoulder External Rotation 70 Degrees      Strength   Overall Strength Comments Assessed seated. IR/er adducted    Strength Assessment Site Shoulder    Right/Left Shoulder Right;Left    Right Shoulder Flexion 5/5    Right Shoulder ABduction 5/5     Right Shoulder Internal Rotation 5/5    Right Shoulder External Rotation 5/5    Left Shoulder Flexion 5/5    Left Shoulder ABduction 5/5    Left Shoulder Internal Rotation 5/5    Left Shoulder External Rotation 5/5  OT Education - 05/05/20 1047     Education Details table slides and scapular retraction.    Person(s) Educated Patient    Methods Explanation;Demonstration;Handout    Comprehension Verbalized understanding              OT Short Term Goals - 05/05/20 1054       OT SHORT TERM GOAL #1   Title patient will be educated and independent with HEP in order to faciliate her progress in therapy and allow her to increase the functional use of her BUE during daily tasks with greater mobility and decreased pain.    Time 4    Period Weeks    Status New    Target Date 06/01/20      OT SHORT TERM GOAL #2   Title patient will report a decrease in pain in BUE to approximately 1/10 or less when completing daily tasks involving reaching above shoulder level or behind her back.    Time 4    Period Weeks    Status New      OT SHORT TERM GOAL #3   Title Patient will increase her BUE A/ROM to [facility] while demonstrating increased ability to complete required reaching tasks above shoulder and behind her back.    Time 4    Period Weeks    Status New      OT SHORT TERM GOAL #4   Title Patient will decrease LUE upper arm and trapezius fascial restrictions to trace amount in order to help increase functional mobility needed to complete all daily tasks.    Time 4    Period Weeks    Status New                      Plan - 05/05/20 1051     Clinical Impression Statement A: Patient is a 59 y/o female S/P bilateral shoulder pain causing decreased ROM  and increase fascial restrictions and pain when completing any self care and reaching task above shoulder level.    OT Occupational Profile and History Problem Focused Assessment -  Including review of records relating to presenting problem    Occupational performance deficits (Please refer to evaluation for details): ADL's    Rehab Potential Good    Clinical Decision Making Limited treatment options, no task modification necessary    Comorbidities Affecting Occupational Performance: None    Modification or Assistance to Complete Evaluation  No modification of tasks or assist necessary to complete eval    OT Frequency 2x / week    OT Duration 4 weeks    OT Treatment/Interventions Self-care/ADL training;Ultrasound;Patient/family education;Passive range of motion;Cryotherapy;Electrical Stimulation;Moist Heat;Therapeutic exercise;Neuromuscular education;Manual Therapy;Therapeutic activities    Plan P: Patient will benefit from skilled OT services to increase functional performance during daily tasks while utilizing both UE's. Treatment plan: Next session complete FOTO. May be able to progress HEP to AA/ROM. Myofascial release to left UE, Passive stretching to BUE, AA/ROM, A/ROM, general strengthening. Modalities PRN.    OT Home Exercise Plan Eval: table slides and scapular retraction    Consulted and Agree with Plan of Care Patient             Patient will benefit from skilled therapeutic intervention in order to improve the following deficits and impairments:           Visit Diagnosis: Acute pain of left shoulder - Plan: Ot plan of care cert/re-cert  Acute pain of right shoulder - Plan: Ot plan of  care cert/re-cert  Stiffness of left shoulder, not elsewhere classified - Plan: Ot plan of care cert/re-cert  Stiffness of right shoulder, not elsewhere classified - Plan: Ot plan of care cert/re-cert    Problem List Patient Active Problem List   Diagnosis Date Noted   Abnormal CT of the abdomen 06/24/2019   Tobacco abuse disorder 10/07/2018   Perianal abscess 10/06/2018   Hypokalemia 10/06/2018   Nausea without vomiting 07/30/2018   Unintentional weight loss  07/30/2018   Constipation 06/05/2018   GERD (gastroesophageal reflux disease) 06/05/2018   Abdominal pain 06/05/2018   Uncontrolled type 2 diabetes mellitus with complication, without long-term current use of insulin ([place]) 08/22/2015   Hyperlipidemia 08/22/2015   Essential hypertension, benign 08/22/2015   Morbid obesity due to excess calories ([place]) 08/22/2015   Personal history of noncompliance with medical treatment, presenting hazards to health 08/22/2015   Ailene Ravel, OTR/L,CBIS  [phone]  05/05/2020, 11:01 AM  [place], [place], [address] Phone: [phone]   Fax:  [phone]  Name: Casey Johnston MRN: [id number] Date of Birth: February 24, 1961   OCCUPATIONAL THERAPY DISCHARGE SUMMARY (04/06/21)  Visits from Start of Care: 1  Current functional level related to goals / functional outcomes: Patient did not return since initial evaluation. No goals met.   Remaining deficits: All deficits remain since initial evaluation as patient did not return. Unknown status of shoulder pain.   Education / Equipment: Table slides at evaluation   Patient agrees to discharge. Patient goals were not met. Patient is being discharged due to not returning since the last visit.Ailene Ravel, OTR/L,CBIS  [phone]

## 2020-05-05 NOTE — Telephone Encounter (Signed)
pt called to cx this appt due to car issues

## 2020-05-09 ENCOUNTER — Telehealth (HOSPITAL_COMMUNITY): Payer: Self-pay

## 2020-05-09 NOTE — Telephone Encounter (Signed)
pt cancelled the rest of her appts and will call back at a later time to reschedule them of her job

## 2020-05-10 ENCOUNTER — Ambulatory Visit (HOSPITAL_COMMUNITY): Payer: Self-pay

## 2020-05-12 ENCOUNTER — Encounter (HOSPITAL_COMMUNITY): Payer: Self-pay

## 2020-05-17 ENCOUNTER — Encounter (HOSPITAL_COMMUNITY): Payer: Self-pay | Admitting: Specialist

## 2020-05-19 ENCOUNTER — Encounter (HOSPITAL_COMMUNITY): Payer: Self-pay | Admitting: Specialist

## 2020-05-23 ENCOUNTER — Other Ambulatory Visit: Payer: Self-pay

## 2020-05-23 ENCOUNTER — Encounter: Payer: Self-pay | Admitting: Physician Assistant

## 2020-05-23 ENCOUNTER — Ambulatory Visit: Payer: HRSA Program | Admitting: Physician Assistant

## 2020-05-23 VITALS — BP 136/68 | HR 69 | Temp 98.4°F | Ht 64.0 in | Wt 214.3 lb

## 2020-05-23 DIAGNOSIS — E1165 Type 2 diabetes mellitus with hyperglycemia: Secondary | ICD-10-CM

## 2020-05-23 NOTE — Progress Notes (Signed)
BP 136/68   Pulse 69   Temp 98.4 F (36.9 C)   Ht 5\' 4"  (1.626 m)   Wt 214 lb 4.8 oz (97.2 kg)   SpO2 95%   BMI 36.78 kg/m    Subjective:    Patient ID: Casey Johnston, female    DOB: [birth year], 60 y.o.   MRN: [id number]  HPI: Casey Johnston is a 60 y.o. female presenting on 05/23/2020 for Diabetes   HPI   Pt had a negative covid 19 screening questionnaire.    Pt is 74yoF who has appointment today to follow up DM.  She did not bring her bs log.  She says "they're out of control"; she reports her bs are all over 300.  She is watching her diet.  She is Using 60u lantus and ssi with novolog.    She is still not vaccinated against covid.     Relevant past medical, surgical, family and social history reviewed and updated as indicated. Interim medical history since our last visit reviewed. Allergies and medications reviewed and updated.   Current Outpatient Medications:  .  amLODipine (NORVASC) 10 MG tablet, TAKE 1 Tablet BY MOUTH ONCE DAILY, Disp: 90 tablet, Rfl: 0 .  atorvastatin (LIPITOR) 20 MG tablet, TAKE 1 Tablet BY MOUTH ONCE DAILY, Disp: 90 tablet, Rfl: 0 .  cloNIDine (CATAPRES) 0.1 MG tablet, TAKE 1 Tablet  BY MOUTH TWICE DAILY, Disp: 180 tablet, Rfl: 0 .  insulin aspart (NOVOLOG) 100 UNIT/ML injection, fbs 111-150 - 1 unit 151-200 - 2u 201-250 - 4 u 251-300 - 6u 301 - 350 - 8u 351- 400- 10 u, Disp: 10 mL, Rfl: PRN .  insulin glargine (LANTUS) 100 UNIT/ML injection, Inject 60 Units into the skin at bedtime., Disp: , Rfl:  .  losartan (COZAAR) 100 MG tablet, TAKE 1 Tablet BY MOUTH ONCE EVERY DAY, Disp: 90 tablet, Rfl: 1 .  lubiprostone (AMITIZA) 24 MCG capsule, 1 PO QHS FOR 7 DAYS THEN 1 PO BID. TAKE WITH FOOD. (Patient taking differently: Take 24 mcg by mouth as needed.), Disp: 62 capsule, Rfl: 11 .  meloxicam (MOBIC) 7.5 MG tablet, Take 1 tablet (7.5 mg total) by mouth daily., Disp: 30 tablet, Rfl: 5 .  metFORMIN (GLUCOPHAGE) 1000 MG tablet, TAKE 1 Tablet  BY  MOUTH TWICE DAILY WITH A MEAL, Disp: 180 tablet, Rfl: 0 .  metoprolol tartrate (LOPRESSOR) 100 MG tablet, TAKE 1 Tablet  BY MOUTH TWICE DAILY, Disp: 180 tablet, Rfl: 0 .  Omega-3 Fatty Acids (FISH OIL) 1000 MG CAPS, Take 1,000 mg by mouth daily., Disp: , Rfl:  .  pantoprazole (PROTONIX) 40 MG tablet, Take 1 tablet (40 mg total) by mouth daily. TAKE 1 TABLET BY MOUTH TWICE DAILY BEFORE A MEAL (Patient taking differently: Take 40 mg by mouth daily.), Disp: 30 tablet, Rfl: 5 .  PROVENTIL HFA 108 (90 Base) MCG/ACT inhaler, INHALE 2 PUFFS BY MOUTH EVERY 6 HOURS AS NEEDED FOR COUGHING, WHEEZING, OR SHORTNESS OF BREATH, Disp: 20.1 g, Rfl: 1    Review of Systems  Per HPI unless specifically indicated above     Objective:    BP 136/68   Pulse 69   Temp 98.4 F (36.9 C)   Ht 5\' 4"  (1.626 m)   Wt 214 lb 4.8 oz (97.2 kg)   SpO2 95%   BMI 36.78 kg/m   Wt Readings from Last 3 Encounters:  05/23/20 214 lb 4.8 oz (97.2 kg)  05/04/20 208 lb 4.8 oz (94.5  kg)  04/18/20 204 lb (92.5 kg)    Physical Exam Constitutional:      General: She is not in acute distress.    Appearance: She is not toxic-appearing.  HENT:     Head: Normocephalic and atraumatic.  Pulmonary:     Effort: Pulmonary effort is normal. No respiratory distress.  Neurological:     Mental Status: She is alert and oriented to person, place, and time.  Psychiatric:        Behavior: Behavior normal.            Assessment & Plan:    Encounter Diagnosis  Name Primary?  Marland Kitchen Uncontrolled type 2 diabetes mellitus with hyperglycemia (HCC) Yes     -Will re-enter refer to endocrinologist as discussed at appointment on 05/04/20.  Pt to increase lantus to 65units daily and continue ssi novolog.   -Again encouraged covid vaccination -F/u 1 month with virtual appointment

## 2020-05-24 ENCOUNTER — Encounter (HOSPITAL_COMMUNITY): Payer: Self-pay

## 2020-05-26 ENCOUNTER — Encounter (HOSPITAL_COMMUNITY): Payer: Self-pay

## 2020-05-31 ENCOUNTER — Encounter (HOSPITAL_COMMUNITY): Payer: Self-pay

## 2020-06-02 ENCOUNTER — Encounter (HOSPITAL_COMMUNITY): Payer: Self-pay

## 2020-06-06 ENCOUNTER — Ambulatory Visit: Payer: Self-pay | Admitting: "Endocrinology

## 2020-06-07 ENCOUNTER — Encounter (HOSPITAL_COMMUNITY): Payer: Self-pay

## 2020-06-09 ENCOUNTER — Encounter (HOSPITAL_COMMUNITY): Payer: Self-pay

## 2020-06-16 ENCOUNTER — Ambulatory Visit: Payer: Self-pay | Admitting: Orthopedic Surgery

## 2020-06-20 ENCOUNTER — Ambulatory Visit: Payer: Self-pay | Admitting: Orthopedic Surgery

## 2020-06-21 ENCOUNTER — Ambulatory Visit (INDEPENDENT_AMBULATORY_CARE_PROVIDER_SITE_OTHER): Payer: Self-pay | Admitting: "Endocrinology

## 2020-06-21 ENCOUNTER — Other Ambulatory Visit: Payer: Self-pay

## 2020-06-21 ENCOUNTER — Encounter: Payer: Self-pay | Admitting: "Endocrinology

## 2020-06-21 VITALS — BP 136/62 | HR 64 | Ht 64.0 in | Wt 215.4 lb

## 2020-06-21 DIAGNOSIS — F17200 Nicotine dependence, unspecified, uncomplicated: Secondary | ICD-10-CM

## 2020-06-21 DIAGNOSIS — E1165 Type 2 diabetes mellitus with hyperglycemia: Secondary | ICD-10-CM | POA: Insufficient documentation

## 2020-06-21 DIAGNOSIS — E782 Mixed hyperlipidemia: Secondary | ICD-10-CM

## 2020-06-21 DIAGNOSIS — I10 Essential (primary) hypertension: Secondary | ICD-10-CM

## 2020-06-21 MED ORDER — INSULIN ASPART 100 UNIT/ML ~~LOC~~ SOLN: 5.0000 [IU] | Freq: Three times a day (TID) | SUBCUTANEOUS | 2 refills | Status: DC

## 2020-06-21 NOTE — Patient Instructions (Signed)

## 2020-06-21 NOTE — Progress Notes (Signed)
Endocrinology Consult Note       06/21/2020, 4:46 PM   Subjective:    Patient ID: Casey Johnston, female    DOB: 1960/12/01.  Casey Johnston is being seen in consultation for management of currently uncontrolled symptomatic diabetes requested by  Soyla Dryer, PA-C.   Past Medical History:  Diagnosis Date  . Anxiety and depression   . Diabetes mellitus, type II ([place])    Dr. Dorris Fetch managing  . Frequent headaches chronic   ibuprofen helps some  . History of UTI    Recurrent per pt (approx 3 per year)  . Hyperlipidemia   . Hypertension   . Leukocytosis 12/2015   mild lymphocytosis.  Path smear review reassuring.  Repeat 01/2016 stable.  . Morbid obesity ([place])   . OSA on CPAP     Past Surgical History:  Procedure Laterality Date  . ABDOMINAL HYSTERECTOMY  1998   Dysfunctional uterine bleeding.  No hx of abnormal paps.  Marland Kitchen BIOPSY  10/20/2018   Procedure: BIOPSY;  Surgeon: Danie Binder, MD;  Location: [facility];  Service: Endoscopy;;  stomach  . COLONOSCOPY  2012   Dr. Earley Brooke: 1.5 cm pendunculated descending colon polyp (tubulovillous adenoma) and diminutive hyperplastic polyps  . COLONOSCOPY N/A 10/20/2018   Procedure: COLONOSCOPY;  Surgeon: Danie Binder, MD; 4 tubular adenomas, diverticulosis in the sigmoid colon, tortuous left colon, external and internal hemorrhoids. Due for repeat in 2023.   Marland Kitchen ESOPHAGOGASTRODUODENOSCOPY N/A 10/20/2018   Procedure: ESOPHAGOGASTRODUODENOSCOPY (EGD);  Surgeon: Danie Binder, MD;  gastritis and duodenitis. No H. Pylori.   Marland Kitchen POLYPECTOMY  10/20/2018   Procedure: POLYPECTOMY;  Surgeon: Danie Binder, MD;  Location: [facility];  Service: Endoscopy;;  . wart removal  08/04/2019   benign    Social History   Socioeconomic History  . Marital status: Widowed    Spouse name: Not on file  . Number of children: Not on file  . Years of education: Not on  file  . Highest education level: Not on file  Occupational History  . Not on file  Tobacco Use  . Smoking status: Current Every Day Smoker    Packs/day: 0.25    Years: 20.00    Pack years: 5.00    Types: Cigarettes  . Smokeless tobacco: Never Used  Vaping Use  . Vaping Use: Never used  Substance and Sexual Activity  . Alcohol use: No  . Drug use: No  . Sexual activity: Not on file  Other Topics Concern  . Not on file  Social History Narrative   Separated x 20 yrs, one daughter.  She lives with her.  Great niece and nephew live with her as well.   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   Tob: 7 pack year hx, quit 2010.   Alc: none   Exercise: none   Social Determinants of Health   Financial Resource Strain: Not on file  Food Insecurity: Not on file  Transportation Needs: Not on file  Physical Activity: Not on file  Stress: Not on file  Social Connections: Not on file    Family History  Problem Relation Age of Onset  . Cancer Mother        ovarian  . COPD Father   . Diabetes Father   . Hyperlipidemia Father   . Heart attack Father   . Heart failure Father   . Colon cancer Neg Hx     Outpatient Encounter Medications as of 06/21/2020  Medication Sig  . amLODipine (NORVASC) 10 MG tablet TAKE 1 Tablet BY MOUTH ONCE DAILY  . atorvastatin (LIPITOR) 20 MG tablet TAKE 1 Tablet BY MOUTH ONCE DAILY  . cloNIDine (CATAPRES) 0.1 MG tablet TAKE 1 Tablet  BY MOUTH TWICE DAILY  . insulin aspart (NOVOLOG) 100 UNIT/ML injection Inject 5-11 Units into the skin 3 (three) times daily before meals.  . insulin glargine (LANTUS) 100 UNIT/ML injection Inject 80 Units into the skin at bedtime.  Marland Kitchen losartan (COZAAR) 100 MG tablet TAKE 1 Tablet BY MOUTH ONCE EVERY DAY  . lubiprostone (AMITIZA) 24 MCG capsule 1 PO QHS FOR 7 DAYS THEN 1 PO BID. TAKE WITH FOOD. (Patient taking differently: Take 24 mcg by mouth as needed.)  . meloxicam (MOBIC) 7.5 MG tablet Take 1 tablet (7.5 mg total) by mouth  daily.  . metFORMIN (GLUCOPHAGE) 1000 MG tablet TAKE 1 Tablet  BY MOUTH TWICE DAILY WITH A MEAL  . metoprolol tartrate (LOPRESSOR) 100 MG tablet TAKE 1 Tablet  BY MOUTH TWICE DAILY  . Omega-3 Fatty Acids (FISH OIL) 1000 MG CAPS Take 1,000 mg by mouth daily.  . pantoprazole (PROTONIX) 40 MG tablet Take 1 tablet (40 mg total) by mouth daily. TAKE 1 TABLET BY MOUTH TWICE DAILY BEFORE A MEAL (Patient taking differently: Take 40 mg by mouth daily.)  . PROVENTIL HFA 108 (90 Base) MCG/ACT inhaler INHALE 2 PUFFS BY MOUTH EVERY 6 HOURS AS NEEDED FOR COUGHING, WHEEZING, OR SHORTNESS OF BREATH  . [DISCONTINUED] insulin aspart (NOVOLOG) 100 UNIT/ML injection fbs 111-150 - 1 unit 151-200 - 2u 201-250 - 4 u 251-300 - 6u 301 - 350 - 8u 351- 400- 10 u   No facility-administered encounter medications on file as of 06/21/2020.    ALLERGIES: Allergies  Allergen Reactions  . Januvia [Sitagliptin] Other (See Comments)    Caused pancreatitis    VACCINATION STATUS: Immunization History  Administered Date(s) Administered  . Tdap 02/17/2016    Diabetes She presents for her initial diabetic visit. She has type 2 diabetes mellitus. Onset time: She was diagnosed at approximate age of 12 years.  She was previously seen in this clinic prior to 2018. Her disease course has been worsening. There are no hypoglycemic associated symptoms. Pertinent negatives for hypoglycemia include no confusion, headaches, pallor or seizures. Associated symptoms include fatigue, polydipsia and polyuria. Pertinent negatives for diabetes include no chest pain and no polyphagia. There are no hypoglycemic complications. Symptoms are worsening. (Noncompliance.) Risk factors for coronary artery disease include diabetes mellitus, dyslipidemia, hypertension, tobacco exposure, post-menopausal, sedentary lifestyle, family history and obesity. Current diabetic treatment includes insulin injections (She takes Lantus 65 units nightly, NovoLog 4 to 10  units 3 times daily AC, Metformin 1000 mg p.o. twice daily.). Her weight is fluctuating minimally. She is following a generally unhealthy diet. When asked about meal planning, she reported none. She has not had a previous visit with a dietitian. She never participates in exercise. Her home blood glucose trend is fluctuating minimally. Her overall blood glucose range is >200 mg/dl. (She brought a piece of paper with her showing significantly above target glycemic profile on random testing. Her  recent A1c was 10.8%.) An ACE inhibitor/angiotensin II receptor blocker is being taken. Eye exam is current.  Hyperlipidemia This is a chronic problem. The current episode started more than 1 year ago. The problem is uncontrolled. Exacerbating diseases include diabetes and obesity. Pertinent negatives include no chest pain, myalgias or shortness of breath. Current antihyperlipidemic treatment includes statins. Risk factors for coronary artery disease include diabetes mellitus, dyslipidemia, hypertension, a sedentary lifestyle, post-menopausal, obesity and family history.  Hypertension This is a chronic problem. The current episode started more than 1 year ago. The problem is controlled. Pertinent negatives include no chest pain, headaches, palpitations or shortness of breath. Risk factors for coronary artery disease include diabetes mellitus, dyslipidemia, family history, obesity, post-menopausal state, sedentary lifestyle and smoking/tobacco exposure. Past treatments include angiotensin blockers.     Review of Systems  Constitutional: Positive for fatigue. Negative for chills, fever and unexpected weight change.  HENT: Negative for trouble swallowing and voice change.   Eyes: Negative for visual disturbance.  Respiratory: Negative for cough, shortness of breath and wheezing.   Cardiovascular: Negative for chest pain, palpitations and leg swelling.  Gastrointestinal: Negative for diarrhea, nausea and vomiting.   Endocrine: Positive for polydipsia and polyuria. Negative for cold intolerance, heat intolerance and polyphagia.  Musculoskeletal: Negative for arthralgias and myalgias.  Skin: Negative for color change, pallor, rash and wound.  Neurological: Negative for seizures and headaches.  Psychiatric/Behavioral: Negative for confusion and suicidal ideas.    Objective:    Vitals with BMI 06/21/2020 05/23/2020 05/04/2020  Height 5\' 4"  5\' 4"  5\' 4"   Weight 215 lbs 6 oz 214 lbs 5 oz 208 lbs 5 oz  BMI 36.96 93.71 69.67  Systolic 893 810 175  Diastolic 62 68 56  Pulse 64 69 62    BP 136/62   Pulse 64   Ht 5\' 4"  (1.626 m)   Wt 215 lb 6.4 oz (97.7 kg)   BMI 36.97 kg/m   Wt Readings from Last 3 Encounters:  06/21/20 215 lb 6.4 oz (97.7 kg)  05/23/20 214 lb 4.8 oz (97.2 kg)  05/04/20 208 lb 4.8 oz (94.5 kg)     Physical Exam Constitutional:      Appearance: She is well-developed.  HENT:     Head: Normocephalic and atraumatic.  Eyes:     Extraocular Movements: EOM normal.  Neck:     Thyroid: No thyromegaly.     Trachea: No tracheal deviation.  Cardiovascular:     Rate and Rhythm: Normal rate and regular rhythm.  Pulmonary:     Effort: Pulmonary effort is normal.  Abdominal:     Tenderness: There is no abdominal tenderness. There is no guarding.  Musculoskeletal:        General: No edema. Normal range of motion.     Cervical back: Normal range of motion and neck supple.  Skin:    General: Skin is warm and dry.     Coloration: Skin is not pale.     Findings: No erythema or rash.  Neurological:     Mental Status: She is alert and oriented to person, place, and time.     Cranial Nerves: No cranial nerve deficit.     Coordination: Coordination normal.     Deep Tendon Reflexes: Reflexes are normal and symmetric.  Psychiatric:        Mood and Affect: Mood and affect normal.     Comments: Reluctant affect.       CMP ( most recent) CMP  Component Value Date/Time   NA 136  04/29/2020 1553   K 3.5 04/29/2020 1553   CL 99 04/29/2020 1553   CO2 27 04/29/2020 1553   GLUCOSE 171 (H) 04/29/2020 1553   BUN 13 04/29/2020 1553   CREATININE 0.71 04/29/2020 1553   CREATININE 0.66 08/27/2016 1521   CALCIUM 9.1 04/29/2020 1553   PROT 8.2 (H) 04/29/2020 1553   ALBUMIN 3.8 04/29/2020 1553   AST 16 04/29/2020 1553   ALT 13 04/29/2020 1553   ALKPHOS 87 04/29/2020 1553   BILITOT 0.4 04/29/2020 1553   GFRNONAA >60 04/29/2020 1553   GFRAA >60 02/09/2020 1054     Diabetic Labs (most recent): Lab Results  Component Value Date   HGBA1C 10.8 (H) 04/29/2020   HGBA1C 10.5 (H) 02/09/2020   HGBA1C 9.6 (H) 11/06/2019     Lipid Panel ( most recent) Lipid Panel     Component Value Date/Time   CHOL 145 04/29/2020 1553   TRIG 155 (H) 04/29/2020 1553   HDL 41 04/29/2020 1553   CHOLHDL 3.5 04/29/2020 1553   VLDL 31 04/29/2020 1553   LDLCALC 73 04/29/2020 1553   LDLDIRECT 119.0 01/18/2016 1038      Lab Results  Component Value Date   TSH 1.23 01/18/2016       Assessment & Plan:   1. Uncontrolled type 2 diabetes mellitus with hyperglycemia ([place])   - [place] has currently uncontrolled symptomatic type 2 DM since  60 years of age,  with most recent A1c of 10.8 %. Recent labs reviewed. She brought a piece of paper with her showing significantly above target glycemic profile on random testing. Her recent A1c was 10.8%.  - I had a long discussion with her about the progressive nature of diabetes and the pathology behind its complications. -her diabetes is complicated by noncompliance/nonadherence, chronic smoking, obesity/sedentary life and she remains at a high risk for more acute and chronic complications which include CAD, CVA, CKD, retinopathy, and neuropathy. These are all discussed in detail with her.  - I have counseled her on diet  and weight management  by adopting a carbohydrate restricted/protein rich diet. Patient is encouraged to switch to   unprocessed or minimally processed     complex starch and increased protein intake (animal or plant source), fruits, and vegetables. -  she is advised to stick to a routine mealtimes to eat 3 meals  a day and avoid unnecessary snacks ( to snack only to correct hypoglycemia).   - she acknowledges that there is a room for improvement in her food and drink choices. - Suggestion is made for her to avoid simple carbohydrates  from her diet including Cakes, Sweet Desserts, Ice Cream, Soda (diet and regular), Sweet Tea, Candies, Chips, Cookies, Store Bought Juices, Alcohol in Excess of  1-2 drinks a day, Artificial Sweeteners,  Coffee Creamer, and "Sugar-free" Products. This will help patient to have more stable blood glucose profile and potentially avoid unintended weight gain.  - she will be scheduled with Jearld Fenton, RDN, CDE for diabetes education.  - I have approached her with the following individualized plan to manage  her diabetes and patient agrees:   -In light of her chronic severe hyperglycemia, she will continue to need intensive treatment with basal/bolus insulin in order for her to achieve control of diabetes to target.  -Accordingly, she is advised to increase her Lantus to 80 units daily at bedtime, increase NovoLog to 5-11 units 3 times a day with meals  for pre-meal BG readings of 90-150mg /dl, plus patient specific correction dose for unexpected hyperglycemia above 150mg /dl, associated with strict monitoring of glucose 4 times a day-before meals and at bedtime. - she is warned not to take insulin without proper monitoring per orders. - Adjustment parameters are given to her for hypo and hyperglycemia in writing. - she is encouraged to call clinic for blood glucose levels less than 70 or above 300 mg /dl. - she is advised to continue Metformin 1000 mg p.o. twice daily, therapeutically suitable for patient . -She will be considered for low-dose glipizide next visit. - she is high risk  for pancreatitis due to chronic heavy smoking, will avoid incretin therapy for now.   - Specific targets for  A1c;  LDL, HDL,  and Triglycerides were discussed with the patient.  2) Blood Pressure /Hypertension:  her blood pressure is  controlled to target.   she is advised to continue her current medications including amlodipine 10 mg p.o. daily, clonidine 0.1 mg p.o. twice daily, losartan 100 mg p.o. daily, metoprolol 100 mg p.o. twice daily.    3) Lipids/Hyperlipidemia:   Review of her recent lipid panel showed uncontrolled  LDL at 73 .  she  is advised to continue    atorvastatin 20 mg daily at bedtime.  Side effects and precautions discussed with her.  4)  Weight/Diet:  Body mass index is 36.97 kg/m.  -   clearly complicating her diabetes care.   she is  a candidate for weight loss. I discussed with her the fact that loss of 5 - 10% of her  current body weight will have the most impact on her diabetes management.  Exercise, and detailed carbohydrates information provided  -  detailed on discharge instructions.  5) Chronic Care/Health Maintenance:  -she  is on ACEI/ARB and Statin medications and  is encouraged to initiate and continue to follow up with Ophthalmology, Dentist,  Podiatrist at least yearly or according to recommendations, and advised to  quit smoking. I have recommended yearly flu vaccine and pneumonia vaccine at least every 5 years; moderate intensity exercise for up to 150 minutes weekly; and  sleep for at least 7 hours a day.  The patient was counseled on the dangers of tobacco use, and was advised to quit.  Reviewed strategies to maximize success, including removing cigarettes and smoking materials from environment.   - she is  advised to maintain close follow up with Soyla Dryer, PA-C for primary care needs, as well as her other providers for optimal and coordinated care.   - Time spent in this patient care: 60 min, of which > 50% was spent in  counseling  her about  her chronically uncontrolled type 2 diabetes, hyperlipidemia, hypertension, smoking and the rest reviewing her blood glucose logs , discussing her hypoglycemia and hyperglycemia episodes, reviewing her current and  previous labs / studies  ( including abstraction from other facilities) and medications  doses and developing a  long term treatment plan based on the latest standards of care/ guidelines; and documenting her care.    Please refer to Patient Instructions for Blood Glucose Monitoring and Insulin/Medications Dosing Guide"  in media tab for additional information. Please  also refer to " Patient Self Inventory" in the Media  tab for reviewed elements of pertinent patient history.  [place] participated in the discussions, expressed understanding, and voiced agreement with the above plans.  All questions were answered to her satisfaction. she is encouraged to contact  clinic should she have any questions or concerns prior to her return visit.   Follow up plan: - Return in about 2 weeks (around 07/05/2020) for Bring Meter and Logs- A1c in Office.  Glade Lloyd, MD [facility] [address] [place], [place] [address] Phone: [phone]  Fax: [phone]    06/21/2020, 4:46 PM  This note was partially dictated with voice recognition software. Similar sounding words can be transcribed inadequately or may not  be corrected upon review.

## 2020-06-23 ENCOUNTER — Encounter: Payer: Self-pay | Admitting: Physician Assistant

## 2020-06-23 ENCOUNTER — Telehealth: Payer: Self-pay | Admitting: "Endocrinology

## 2020-06-23 ENCOUNTER — Ambulatory Visit: Payer: HRSA Program | Admitting: Physician Assistant

## 2020-06-23 VITALS — BP 132/62

## 2020-06-23 DIAGNOSIS — I10 Essential (primary) hypertension: Secondary | ICD-10-CM

## 2020-06-23 DIAGNOSIS — E669 Obesity, unspecified: Secondary | ICD-10-CM

## 2020-06-23 DIAGNOSIS — E1165 Type 2 diabetes mellitus with hyperglycemia: Secondary | ICD-10-CM

## 2020-06-23 DIAGNOSIS — F17200 Nicotine dependence, unspecified, uncomplicated: Secondary | ICD-10-CM

## 2020-06-23 DIAGNOSIS — E785 Hyperlipidemia, unspecified: Secondary | ICD-10-CM

## 2020-06-23 NOTE — Telephone Encounter (Signed)
Discussed with pt's daughter Tandy Gaw how pt is to take her Novolog. Understanding voiced.

## 2020-06-23 NOTE — Progress Notes (Signed)
There were no vitals taken for this visit.   Subjective:    Patient ID: Casey Johnston, female    DOB: 09/30/1960, 60 y.o.   MRN: [id number]  HPI: Casey Johnston is a 60 y.o. female presenting on 06/23/2020 for No chief complaint on file.   HPI    This is a telemedicine appointment through [employer] due to coronavirus pandemic.  I connected with  [place] on 06/23/20 by a video enabled telemedicine application and verified that I am speaking with the correct person using two identifiers.   I discussed the limitations of evaluation and management by telemedicine. The patient expressed understanding and agreed to proceed.  Pt is at work.  Provider is at office.  Pt states she has privacy needed for her appointment.       Pt is 33yoF with uncontrolled DM, htn an dyslipidemia.    Today's appointment is primarly to check on her DM.    Pt is Working at [employer] She is Checks bp at home- yesterday it was 132/62  She has not gotten the covid vaccination  Pt says her bs are still high.  She seen dr Dorris Fetch earlier this week.        Relevant past medical, surgical, family and social history reviewed and updated as indicated. Interim medical history since our last visit reviewed. Allergies and medications reviewed and updated.   Current Outpatient Medications:  .  amLODipine (NORVASC) 10 MG tablet, TAKE 1 Tablet BY MOUTH ONCE DAILY, Disp: 90 tablet, Rfl: 0 .  atorvastatin (LIPITOR) 20 MG tablet, TAKE 1 Tablet BY MOUTH ONCE DAILY, Disp: 90 tablet, Rfl: 0 .  cloNIDine (CATAPRES) 0.1 MG tablet, TAKE 1 Tablet  BY MOUTH TWICE DAILY, Disp: 180 tablet, Rfl: 0 .  insulin aspart (NOVOLOG) 100 UNIT/ML injection, Inject 5-11 Units into the skin 3 (three) times daily before meals., Disp: 15 mL, Rfl: 2 .  insulin glargine (LANTUS) 100 UNIT/ML injection, Inject 80 Units into the skin at bedtime., Disp: , Rfl:  .  losartan (COZAAR) 100 MG tablet, TAKE 1 Tablet BY MOUTH ONCE EVERY DAY,  Disp: 90 tablet, Rfl: 1 .  lubiprostone (AMITIZA) 24 MCG capsule, 1 PO QHS FOR 7 DAYS THEN 1 PO BID. TAKE WITH FOOD. (Patient taking differently: Take 24 mcg by mouth as needed.), Disp: 62 capsule, Rfl: 11 .  metFORMIN (GLUCOPHAGE) 1000 MG tablet, TAKE 1 Tablet  BY MOUTH TWICE DAILY WITH A MEAL, Disp: 180 tablet, Rfl: 0 .  metoprolol tartrate (LOPRESSOR) 100 MG tablet, TAKE 1 Tablet  BY MOUTH TWICE DAILY, Disp: 180 tablet, Rfl: 0 .  Omega-3 Fatty Acids (FISH OIL) 1000 MG CAPS, Take 1,000 mg by mouth daily., Disp: , Rfl:  .  PROVENTIL HFA 108 (90 Base) MCG/ACT inhaler, INHALE 2 PUFFS BY MOUTH EVERY 6 HOURS AS NEEDED FOR COUGHING, WHEEZING, OR SHORTNESS OF BREATH, Disp: 20.1 g, Rfl: 1 .  meloxicam (MOBIC) 7.5 MG tablet, Take 1 tablet (7.5 mg total) by mouth daily., Disp: 30 tablet, Rfl: 5 .  pantoprazole (PROTONIX) 40 MG tablet, Take 1 tablet (40 mg total) by mouth daily. TAKE 1 TABLET BY MOUTH TWICE DAILY BEFORE A MEAL (Patient taking differently: Take 40 mg by mouth daily.), Disp: 30 tablet, Rfl: 5     Review of Systems  Per HPI unless specifically indicated above     Objective:    There were no vitals taken for this visit.  Wt Readings from Last 3 Encounters:  06/21/20  215 lb 6.4 oz (97.7 kg)  05/23/20 214 lb 4.8 oz (97.2 kg)  05/04/20 208 lb 4.8 oz (94.5 kg)    Physical Exam Constitutional:      General: She is not in acute distress.    Appearance: She is not toxic-appearing.  HENT:     Head: Normocephalic and atraumatic.  Pulmonary:     Effort: No respiratory distress.  Neurological:     Mental Status: She is alert and oriented to person, place, and time.  Psychiatric:        Attention and Perception: Attention normal.        Speech: Speech normal.        Behavior: Behavior is cooperative.             Assessment & Plan:    Encounter Diagnoses  Name Primary?  Marland Kitchen Uncontrolled type 2 diabetes mellitus with hyperglycemia ([place]) Yes  . Essential hypertension, benign    . Hyperlipidemia, unspecified hyperlipidemia type   . Tobacco use disorder   . Obesity, unspecified classification, unspecified obesity type, unspecified whether serious comorbidity present       -it is great that pt is established with endocrinology now so hopefully can get DM controlled -pt to continue her other medications -encouraged pt to get covid vaccination -pt to follow up in 2 months.  She is to contact office sooner prn

## 2020-06-23 NOTE — Telephone Encounter (Signed)
Pt daughter Tandy Gaw called and said that when pt blood sugars are in a certain range she is to take Novolog. She is calling to see what those ranges are. Cb# [phone]

## 2020-06-24 ENCOUNTER — Ambulatory Visit: Payer: Self-pay | Admitting: Gastroenterology

## 2020-07-05 ENCOUNTER — Ambulatory Visit: Payer: Self-pay | Admitting: "Endocrinology

## 2020-07-14 ENCOUNTER — Telehealth: Payer: Self-pay | Admitting: Nutrition

## 2020-07-14 ENCOUNTER — Encounter: Payer: Self-pay | Admitting: Nutrition

## 2020-07-14 ENCOUNTER — Encounter: Payer: Self-pay | Attending: "Endocrinology | Admitting: Nutrition

## 2020-07-14 ENCOUNTER — Other Ambulatory Visit: Payer: Self-pay

## 2020-07-14 VITALS — Ht 65.0 in | Wt 215.0 lb

## 2020-07-14 DIAGNOSIS — E1165 Type 2 diabetes mellitus with hyperglycemia: Secondary | ICD-10-CM | POA: Insufficient documentation

## 2020-07-14 DIAGNOSIS — I10 Essential (primary) hypertension: Secondary | ICD-10-CM | POA: Insufficient documentation

## 2020-07-14 DIAGNOSIS — E782 Mixed hyperlipidemia: Secondary | ICD-10-CM | POA: Insufficient documentation

## 2020-07-14 NOTE — Telephone Encounter (Signed)
VM lelft to call back to complete phone visit for her DM education. No answer.

## 2020-07-14 NOTE — Progress Notes (Signed)
Medical Nutrition Therapy This visit was completed via telephone due to the COVID-19 pandemic.   I spoke with [place] and verified that I was speaking with the correct person with two patient identifiers (full name and date of birth).   I discussed the limitations related to this kind of visit and the patient is willing to proceed.  Appointment Start time:  1530  Appointment End time:  79  Primary concerns today: Diabetes Type 2,  Referral diagnosis: E11.8 Preferred learning style: no preference indicated  Learning readiness:   change in progress   NUTRITION ASSESSMENT  She didn't understand how to use the sliding scale. Admits she needs to eat meals on time and take  Insulin as instructed. Willing to work on eating better balanced meals per MyPlate.  Anthropometrics  Wt Readings from Last 3 Encounters:  07/14/20 215 lb (97.5 kg)  06/21/20 215 lb 6.4 oz (97.7 kg)  05/23/20 214 lb 4.8 oz (97.2 kg)   Ht Readings from Last 3 Encounters:  07/14/20 5\' 5"  (1.651 m)  06/21/20 5\' 4"  (1.626 m)  05/23/20 5\' 4"  (1.626 m)   Body mass index is 35.78 kg/m. @BMIFA @ Facility age limit for growth percentiles is 20 years. Facility age limit for growth percentiles is 20 years.   Clinical Medical Hx: HTn, Hyperlipidemia, Obesity Medications: Metformin 1000 mg BID, Lantus 80 and Novolog 5 units plus sliding scale. Labs: Lab Results  Component Value Date   HGBA1C 10.8 (H) 04/29/2020   CMP Latest Ref Rng & Units 04/29/2020 02/09/2020 11/06/2019  Glucose 70 - 99 mg/dL 171(H) 275(H) 217(H)  BUN 6 - 20 mg/dL 13 11 20   Creatinine 0.44 - 1.00 mg/dL 0.71 0.65 0.84  Sodium 135 - 145 mmol/L 136 137 138  Potassium 3.5 - 5.1 mmol/L 3.5 3.8 4.2  Chloride 98 - 111 mmol/L 99 101 101  CO2 22 - 32 mmol/L 27 27 25   Calcium 8.9 - 10.3 mg/dL 9.1 9.3 8.9  Total Protein 6.5 - 8.1 g/dL 8.2(H) 7.8 8.3(H)  Total Bilirubin 0.3 - 1.2 mg/dL 0.4 0.6 0.2(L)  Alkaline Phos 38 - 126 U/L 87 83 83  AST 15  - 41 U/L 16 15 19   ALT 0 - 44 U/L 13 13 16    Lipid Panel     Component Value Date/Time   CHOL 145 04/29/2020 1553   TRIG 155 (H) 04/29/2020 1553   HDL 41 04/29/2020 1553   CHOLHDL 3.5 04/29/2020 1553   VLDL 31 04/29/2020 1553   LDLCALC 73 04/29/2020 1553   LDLDIRECT 119.0 01/18/2016 1038    Notable Signs/Symptoms:  Increased thirst, frequent urination, blurry vision, fatigue,   Lifestyle & Dietary Hx Lives home and her daughter lives with her. She just started a new temporary job. Never had a diabetes educator work with her before. Willing to make changes to improve her DM. Doesn't like testing blood sugars. Would be a canidate for Elenor Legato but she doesn't have insurance.  Estimated daily fluid intake: 64 oz Supplements: none Sleep: 6-8 hrs  Stress / self-care: fiances, job, no insurance  Current average weekly physical activity: ADL  24-Hr Dietary Recall First Meal: small banana and yogurt Snack:  Second Meal: TV dinner Snack: Blueberries. Third Meal:  5 oz of steak, water Snack:  Beverages: water,  Estimated Energy Needs Calories: 1200  Carbohydrate: 135g Protein: 90g Fat: 44g   NUTRITION DIAGNOSIS  NB-1.1 Food and nutrition-related knowledge deficit As related to Diabetes Type 2.  As evidenced by A1C 10.8%.  NUTRITION INTERVENTION  Nutrition education (E-1) on the following topics:  . Nutrition and Diabetes education provided on My Plate, CHO counting, meal planning, portion sizes, timing of meals, avoiding snacks between meals unless having a low blood sugar, target ranges for A1C and blood sugars, signs/symptoms and treatment of hyper/hypoglycemia, monitoring blood sugars, taking medications as prescribed, benefits of exercising 30 minutes per day and prevention of complications of DM. Marland Kitchen   Handouts Provided Include   Emailed My Plate, carb counting, meal planning, diabetes instructions, s/s of hyper/hypoglycemia, timing of meals  Learning Style &  Readiness for Change Teaching method utilized: Visual & Auditory  Demonstrated degree of understanding via: Teach Back  Barriers to learning/adherence to lifestyle change: none  Goals Established by Pt   Follow MY Plate Test blood sugar before each meal Follow Sliding scale coverage for meal time insulin Drink only water-100 oz per day Eat meals on time Eat 2-3 carb choices per meal  MONITORING & EVALUATION Dietary intake, weekly physical activity, and blood sugars in 1 month. She is requesting referral to a therapist and get treated for depression.  Next Steps  Patient is to test blood sugars and record on sheet 4 times per day.Marland Kitchen

## 2020-07-14 NOTE — Patient Instructions (Addendum)
Goals  Follow MY Plate Test blood sugar before each meal Follow Sliding scale coverage for meal time insulin Drink only water-100 oz per day Eat meals on time Eat 2-3 carb choices per meal.

## 2020-07-16 NOTE — Progress Notes (Deleted)
Referring Provider: Soyla Dryer, PA-C Primary Care Physician:  Soyla Dryer, PA-C Primary GI Physician: Dr. Rayne Du chief complaint on file.   HPI:   Casey Johnston is a 60 y.o. female presenting today for follow-up. History ofGERD,constipation, chronic abdominal pain that seems to be associated with underlying constipation. TCS and EGD in June 2020. TCS with 4 tubular adenomas,diverticulosis in the sigmoid colon, tortuous left colon, external and internal hemorrhoids.Due for repeat in 2023. EGD with gastritis and duodenitis. No H. Pylori. Also with 1.2 cm subcutaneous nodulein the right low anterior abdominal wall noted on CT. Underwent US core biopsy on 08/17/19 which revealed benign epidermoid cyst.   Last seen in our office 12/23/2019.  [place] health department denied Linzess based on 2019 factors.  Started taking Amitiza 24 mcg 3 times daily every other day for the last couple weeks and was having BMs no more than once a week.  No alarm symptoms.  Never added MiraLAX daily.  Intermittent abdominal pain that improves with bowel movements continued.  Protonix 40 mg daily controlling GERD well.  Plan to stop Amitiza and start Linzess 145 mcg.  We provided samples of Linzess and will have patient resubmit for patient assistance.  Continue Protonix 40 mg daily and follow-up in 6 months.  If patient unable to get approved for Linzess, resume Amitiza 24 mcg twice daily and add MiraLAX daily to twice daily.    Past Medical History:  Diagnosis Date  . Anxiety and depression   . Diabetes mellitus, type II ([place])    Dr. Dorris Fetch managing  . Frequent headaches chronic   ibuprofen helps some  . History of UTI    Recurrent per pt (approx 3 per year)  . Hyperlipidemia   . Hypertension   . Leukocytosis 12/2015   mild lymphocytosis.  Path smear review reassuring.  Repeat 01/2016 stable.  . Morbid obesity ([place])   . OSA on CPAP     Past Surgical History:  Procedure Laterality Date   . ABDOMINAL HYSTERECTOMY  1998   Dysfunctional uterine bleeding.  No hx of abnormal paps.  Marland Kitchen BIOPSY  10/20/2018   Procedure: BIOPSY;  Surgeon: Danie Binder, MD;  [facility]: [facility];  Service: Endoscopy;;  stomach  . COLONOSCOPY  2012   Dr. Earley Brooke: 1.5 cm pendunculated descending colon polyp (tubulovillous adenoma) and diminutive hyperplastic polyps  . COLONOSCOPY N/A 10/20/2018   Procedure: COLONOSCOPY;  Surgeon: Danie Binder, MD; 4 tubular adenomas, diverticulosis in the sigmoid colon, tortuous left colon, external and internal hemorrhoids. Due for repeat in 2023.   Marland Kitchen ESOPHAGOGASTRODUODENOSCOPY N/A 10/20/2018   Procedure: ESOPHAGOGASTRODUODENOSCOPY (EGD);  Surgeon: Danie Binder, MD;  gastritis and duodenitis. No H. Pylori.   Marland Kitchen POLYPECTOMY  10/20/2018   Procedure: POLYPECTOMY;  Surgeon: Danie Binder, MD;  Location: [facility];  Service: Endoscopy;;  . wart removal  08/04/2019   benign    Current Outpatient Medications  Medication Sig Dispense Refill  . amLODipine (NORVASC) 10 MG tablet TAKE 1 Tablet BY MOUTH ONCE DAILY 90 tablet 0  . atorvastatin (LIPITOR) 20 MG tablet TAKE 1 Tablet BY MOUTH ONCE DAILY 90 tablet 0  . cloNIDine (CATAPRES) 0.1 MG tablet TAKE 1 Tablet  BY MOUTH TWICE DAILY 180 tablet 0  . insulin aspart (NOVOLOG) 100 UNIT/ML injection Inject 5-11 Units into the skin 3 (three) times daily before meals. 15 mL 2  . insulin glargine (LANTUS) 100 UNIT/ML injection Inject 80 Units into the skin  at bedtime.    Marland Kitchen losartan (COZAAR) 100 MG tablet TAKE 1 Tablet BY MOUTH ONCE EVERY DAY 90 tablet 1  . lubiprostone (AMITIZA) 24 MCG capsule 1 PO QHS FOR 7 DAYS THEN 1 PO BID. TAKE WITH FOOD. (Patient taking differently: Take 24 mcg by mouth as needed.) 62 capsule 11  . meloxicam (MOBIC) 7.5 MG tablet Take 1 tablet (7.5 mg total) by mouth daily. 30 tablet 5  . metFORMIN (GLUCOPHAGE) 1000 MG tablet TAKE 1 Tablet  BY MOUTH TWICE DAILY WITH A MEAL 180 tablet 0  . metoprolol  tartrate (LOPRESSOR) 100 MG tablet TAKE 1 Tablet  BY MOUTH TWICE DAILY 180 tablet 0  . Omega-3 Fatty Acids (FISH OIL) 1000 MG CAPS Take 1,000 mg by mouth daily.    . pantoprazole (PROTONIX) 40 MG tablet Take 1 tablet (40 mg total) by mouth daily. TAKE 1 TABLET BY MOUTH TWICE DAILY BEFORE A MEAL (Patient taking differently: Take 40 mg by mouth daily.) 30 tablet 5  . PROVENTIL HFA 108 (90 Base) MCG/ACT inhaler INHALE 2 PUFFS BY MOUTH EVERY 6 HOURS AS NEEDED FOR COUGHING, WHEEZING, OR SHORTNESS OF BREATH 20.1 g 1   No current facility-administered medications for this visit.    Allergies as of 07/18/2020 - Review Complete 07/14/2020  Allergen Reaction Noted  . Januvia [sitagliptin] Other (See Comments) 05/21/2018    Family History  Problem Relation Age of Onset  . Cancer Mother        ovarian  . COPD Father   . Diabetes Father   . Hyperlipidemia Father   . Heart attack Father   . Heart failure Father   . Colon cancer Neg Hx     Social History   Socioeconomic History  . Marital status: Widowed    Spouse name: Not on file  . Number of children: Not on file  . Years of education: Not on file  . Highest education level: Not on file  Occupational History  . Not on file  Tobacco Use  . Smoking status: Current Every Day Smoker    Packs/day: 0.25    Years: 20.00    Pack years: 5.00    Types: Cigarettes  . Smokeless tobacco: Never Used  Vaping Use  . Vaping Use: Never used  Substance and Sexual Activity  . Alcohol use: No  . Drug use: No  . Sexual activity: Not on file  Other Topics Concern  . Not on file  Social History Narrative   Separated x 20 yrs, one daughter.  She lives with her.  Great niece and nephew live with her as well.   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   Tob: 7 pack year hx, quit 2010.   Alc: none   Exercise: none   Social Determinants of Health   Financial Resource Strain: Not on file  Food Insecurity: Not on file  Transportation Needs: Not on  file  Physical Activity: Not on file  Stress: Not on file  Social Connections: Not on file    Review of Systems: Gen: Denies fever, chills, anorexia. Denies fatigue, weakness, weight loss.  CV: Denies chest pain, palpitations, syncope, peripheral edema, and claudication. Resp: Denies dyspnea at rest, cough, wheezing, coughing up blood, and pleurisy. GI: Denies vomiting blood, jaundice, and fecal incontinence.   Denies dysphagia or odynophagia. Derm: Denies rash, itching, dry skin Psych: Denies depression, anxiety, memory loss, confusion. No homicidal or suicidal ideation.  Heme: Denies bruising, bleeding, and enlarged lymph nodes.  Physical Exam: There  were no vitals taken for this visit. General:   Alert and oriented. No distress noted. Pleasant and cooperative.  Head:  Normocephalic and atraumatic. Eyes:  Conjuctiva clear without scleral icterus. Mouth:  Oral mucosa pink and moist. Good dentition. No lesions. Heart:  S1, S2 present without murmurs appreciated. Lungs:  Clear to auscultation bilaterally. No wheezes, rales, or rhonchi. No distress.  Abdomen:  +BS, soft, non-tender and non-distended. No rebound or guarding. No HSM or masses noted. Msk:  Symmetrical without gross deformities. Normal posture. Extremities:  Without edema. Neurologic:  Alert and  oriented x4 Psych:  Alert and cooperative. Normal mood and affect.

## 2020-07-17 ENCOUNTER — Other Ambulatory Visit: Payer: Self-pay | Admitting: Nurse Practitioner

## 2020-07-17 DIAGNOSIS — K219 Gastro-esophageal reflux disease without esophagitis: Secondary | ICD-10-CM

## 2020-07-18 ENCOUNTER — Other Ambulatory Visit: Payer: Self-pay | Admitting: Physician Assistant

## 2020-07-18 ENCOUNTER — Ambulatory Visit: Payer: Self-pay | Admitting: Gastroenterology

## 2020-07-19 ENCOUNTER — Encounter: Payer: Self-pay | Admitting: "Endocrinology

## 2020-07-19 ENCOUNTER — Other Ambulatory Visit: Payer: Self-pay

## 2020-07-19 ENCOUNTER — Ambulatory Visit (INDEPENDENT_AMBULATORY_CARE_PROVIDER_SITE_OTHER): Payer: Self-pay | Admitting: "Endocrinology

## 2020-07-19 VITALS — BP 161/77 | HR 68 | Ht 64.0 in | Wt 215.2 lb

## 2020-07-19 DIAGNOSIS — F17200 Nicotine dependence, unspecified, uncomplicated: Secondary | ICD-10-CM

## 2020-07-19 DIAGNOSIS — E782 Mixed hyperlipidemia: Secondary | ICD-10-CM

## 2020-07-19 DIAGNOSIS — E1165 Type 2 diabetes mellitus with hyperglycemia: Secondary | ICD-10-CM

## 2020-07-19 DIAGNOSIS — I10 Essential (primary) hypertension: Secondary | ICD-10-CM

## 2020-07-19 LAB — POCT GLYCOSYLATED HEMOGLOBIN (HGB A1C): HbA1c, POC (controlled diabetic range): 11 % — AB (ref 0.0–7.0)

## 2020-07-19 MED ORDER — INSULIN ASPART 100 UNIT/ML ~~LOC~~ SOLN: 10.0000 [IU] | Freq: Three times a day (TID) | SUBCUTANEOUS | 2 refills | Status: DC

## 2020-07-19 MED ORDER — GLIPIZIDE ER 5 MG PO TB24: 5.0000 mg | ORAL_TABLET | Freq: Every day | ORAL | 3 refills | Status: DC

## 2020-07-19 NOTE — Patient Instructions (Signed)
                                     Advice for Weight Management  -For most of us the best way to lose weight is by diet management. Generally speaking, diet management means consuming less calories intentionally which over time brings about progressive weight loss.  This can be achieved more effectively by restricting carbohydrate consumption to the minimum possible.  So, it is critically important to know your numbers: how much calorie you are consuming and how much calorie you need. More importantly, our carbohydrates sources should be unprocessed or minimally processed complex starch food items.   Sometimes, it is important to balance nutrition by increasing protein intake (animal or plant source), fruits, and vegetables.  -Sticking to a routine mealtime to eat 3 meals a day and avoiding unnecessary snacks is shown to have a big role in weight control. Under normal circumstances, the only time we lose real weight is when we are hungry, so allow hunger to take place- hunger means no food between meal times, only water.  It is not advisable to starve.   -It is better to avoid simple carbohydrates including: Cakes, Sweet Desserts, Ice Cream, Soda (diet and regular), Sweet Tea, Candies, Chips, Cookies, Store Bought Juices, Alcohol in Excess of  1-2 drinks a day, Lemonade,  Artificial Sweeteners, Doughnuts, Coffee Creamers, "Sugar-free" Products, etc, etc.  This is not a complete list.....    -Consulting with certified diabetes educators is proven to provide you with the most accurate and current information on diet.  Also, you may be  interested in discussing diet options/exchanges , we can schedule a visit with Casey Johnston, RDN, CDE for individualized nutrition education.  -Exercise: If you are able: 30 -60 minutes a day ,4 days a week, or 150 minutes a week.  The longer the better.  Combine stretch, strength, and aerobic activities.  If you were told in the  past that you have high risk for cardiovascular diseases, you may seek evaluation by your heart doctor prior to initiating moderate to intense exercise programs.                                  Additional Care Considerations for Diabetes   -Diabetes  is a chronic disease.  The most important care consideration is regular follow-up with your diabetes care provider with the goal being avoiding or delaying its complications and to take advantage of advances in medications and technology.    -Type 2 diabetes is known to coexist with other important comorbidities such as high blood pressure and high cholesterol.  It is critical to control not only the diabetes but also the high blood pressure and high cholesterol to minimize and delay the risk of complications including coronary artery disease, stroke, amputations, blindness, etc.    - Studies showed that people with diabetes will benefit from a class of medications known as ACE inhibitors and statins.  Unless there are specific reasons not to be on these medications, the standard of care is to consider getting one from these groups of medications at an optimal doses.  These medications are generally considered safe and proven to help protect the heart and the kidneys.    - People with diabetes are encouraged to initiate and maintain regular follow-up with eye doctors, foot   doctors, dentists , and if necessary heart and kidney doctors.     - It is highly recommended that people with diabetes quit smoking or stay away from smoking, and get yearly  flu vaccine and pneumonia vaccine at least every 5 years.  One other important lifestyle recommendation is to ensure adequate sleep - at least 6-7 hours of uninterrupted sleep at night.  -Exercise: If you are able: 30 -60 minutes a day, 4 days a week, or 150 minutes a week.  The longer the better.  Combine stretch, strength, and aerobic activities.  If you were told in the past that you have high risk for  cardiovascular diseases, you may seek evaluation by your heart doctor prior to initiating moderate to intense exercise programs.          

## 2020-07-19 NOTE — Progress Notes (Signed)
07/19/2020, 5:25 PM  Endocrinology follow-up note   Subjective:    Patient ID: Casey Johnston, female    DOB: [birth year].  Casey Johnston is being seen in follow-up after she was seen consultation for management of currently uncontrolled symptomatic diabetes requested by  Soyla Dryer, PA-C.   Past Medical History:  Diagnosis Date  . Anxiety and depression   . Diabetes mellitus, type II ([place])    Dr. Dorris Fetch managing  . Frequent headaches chronic   ibuprofen helps some  . History of UTI    Recurrent per pt (approx 3 per year)  . Hyperlipidemia   . Hypertension   . Leukocytosis 12/2015   mild lymphocytosis.  Path smear review reassuring.  Repeat 01/2016 stable.  . Morbid obesity ([place])   . OSA on CPAP     Past Surgical History:  Procedure Laterality Date  . ABDOMINAL HYSTERECTOMY  1998   Dysfunctional uterine bleeding.  No hx of abnormal paps.  Marland Kitchen BIOPSY  10/20/2018   Procedure: BIOPSY;  Surgeon: Danie Binder, MD;  Location: [facility];  Service: Endoscopy;;  stomach  . COLONOSCOPY  2012   Dr. Earley Brooke: 1.5 cm pendunculated descending colon polyp (tubulovillous adenoma) and diminutive hyperplastic polyps  . COLONOSCOPY N/A 10/20/2018   Procedure: COLONOSCOPY;  Surgeon: Danie Binder, MD; 4 tubular adenomas, diverticulosis in the sigmoid colon, tortuous left colon, external and internal hemorrhoids. Due for repeat in 2023.   Marland Kitchen ESOPHAGOGASTRODUODENOSCOPY N/A 10/20/2018   Procedure: ESOPHAGOGASTRODUODENOSCOPY (EGD);  Surgeon: Danie Binder, MD;  gastritis and duodenitis. No H. Pylori.   Marland Kitchen POLYPECTOMY  10/20/2018   Procedure: POLYPECTOMY;  Surgeon: Danie Binder, MD;  Location: [facility];  Service: Endoscopy;;  . wart removal  08/04/2019   benign    Social History   Socioeconomic History  . Marital status: Widowed    Spouse name: Not on file  . Number of children: Not on file  .  Years of education: Not on file  . Highest education level: Not on file  Occupational History  . Not on file  Tobacco Use  . Smoking status: Current Every Day Smoker    Packs/day: 0.25    Years: 20.00    Pack years: 5.00    Types: Cigarettes  . Smokeless tobacco: Never Used  Vaping Use  . Vaping Use: Never used  Substance and Sexual Activity  . Alcohol use: No  . Drug use: No  . Sexual activity: Not on file  Other Topics Concern  . Not on file  Social History Narrative   Separated x 20 yrs, one daughter.  She lives with her.  Great niece and nephew live with her as well.   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   Tob: 7 pack year hx, quit 2010.   Alc: none   Exercise: none   Social Determinants of Health   Financial Resource Strain: Not on file  Food Insecurity: Not on file  Transportation Needs: Not on file  Physical Activity: Not on file  Stress: Not on file  Social Connections: Not on file    Family History  Problem Relation Age of Onset  . Cancer Mother        ovarian  . COPD Father   . Diabetes Father   . Hyperlipidemia Father   . Heart attack Father   . Heart failure Father   . Colon cancer Neg Hx     Outpatient Encounter Medications as of 07/19/2020  Medication Sig  . glipiZIDE (GLUCOTROL XL) 5 MG 24 hr tablet Take 1 tablet (5 mg total) by mouth daily with breakfast.  . amLODipine (NORVASC) 10 MG tablet TAKE 1 Tablet BY MOUTH ONCE DAILY  . atorvastatin (LIPITOR) 20 MG tablet TAKE 1 Tablet BY MOUTH ONCE EVERY DAY  . cloNIDine (CATAPRES) 0.1 MG tablet TAKE 1 Tablet  BY MOUTH TWICE DAILY  . insulin aspart (NOVOLOG) 100 UNIT/ML injection Inject 10-16 Units into the skin 3 (three) times daily before meals.  . insulin glargine (LANTUS) 100 UNIT/ML injection Inject 80 Units into the skin at bedtime.  Marland Kitchen losartan (COZAAR) 100 MG tablet TAKE 1 Tablet BY MOUTH ONCE EVERY DAY  . lubiprostone (AMITIZA) 24 MCG capsule 1 PO QHS FOR 7 DAYS THEN 1 PO BID. TAKE WITH FOOD.  (Patient taking differently: Take 24 mcg by mouth as needed.)  . meloxicam (MOBIC) 7.5 MG tablet Take 1 tablet (7.5 mg total) by mouth daily.  . metFORMIN (GLUCOPHAGE) 1000 MG tablet TAKE 1 Tablet  BY MOUTH TWICE DAILY WITH A MEAL  . metoprolol tartrate (LOPRESSOR) 100 MG tablet TAKE 1 Tablet  BY MOUTH TWICE DAILY  . Omega-3 Fatty Acids (FISH OIL) 1000 MG CAPS Take 1,000 mg by mouth daily.  . pantoprazole (PROTONIX) 40 MG tablet Take 1 tablet (40 mg total) by mouth daily.  Marland Kitchen PROVENTIL HFA 108 (90 Base) MCG/ACT inhaler INHALE 2 PUFFS BY MOUTH EVERY 6 HOURS AS NEEDED FOR COUGHING, WHEEZING, OR SHORTNESS OF BREATH  . [DISCONTINUED] insulin aspart (NOVOLOG) 100 UNIT/ML injection Inject 5-11 Units into the skin 3 (three) times daily before meals.   No facility-administered encounter medications on file as of 07/19/2020.    ALLERGIES: Allergies  Allergen Reactions  . Januvia [Sitagliptin] Other (See Comments)    Caused pancreatitis    VACCINATION STATUS: Immunization History  Administered Date(s) Administered  . Tdap 02/17/2016    Diabetes She presents for her follow-up diabetic visit. She has type 2 diabetes mellitus. Onset time: She was diagnosed at approximate age of 64 years.  She was previously seen in this clinic prior to 2018. Her disease course has been worsening. There are no hypoglycemic associated symptoms. Pertinent negatives for hypoglycemia include no confusion, headaches, pallor or seizures. Associated symptoms include fatigue, polydipsia and polyuria. Pertinent negatives for diabetes include no chest pain and no polyphagia. There are no hypoglycemic complications. Symptoms are worsening. (Noncompliance.) Risk factors for coronary artery disease include diabetes mellitus, dyslipidemia, hypertension, tobacco exposure, post-menopausal, sedentary lifestyle, family history and obesity. Current diabetic treatment includes insulin injections (She takes Lantus 65 units nightly, NovoLog 4 to  10 units 3 times daily AC, Metformin 1000 mg p.o. twice daily.). Her weight is fluctuating minimally. She is following a generally unhealthy diet. When asked about meal planning, she reported none. She has not had a previous visit with a dietitian. She never participates in exercise. Her home blood glucose trend is increasing steadily. Her breakfast blood glucose range is generally >200 mg/dl. Her lunch blood glucose range is generally >200 mg/dl. Her dinner blood glucose range is generally >200 mg/dl. Her bedtime blood glucose range is generally >200  mg/dl. Her overall blood glucose range is >200 mg/dl. (She presents with a log showing persistently above target glycemic profile.  She is skipping lunch.  Her point-of-care A1c is 11%, unchanged from her last visit A1c of 10.8% .  She did not document or report hypoglycemia.) An ACE inhibitor/angiotensin II receptor blocker is being taken. Eye exam is current.  Hyperlipidemia This is a chronic problem. The current episode started more than 1 year ago. The problem is uncontrolled. Exacerbating diseases include diabetes and obesity. Pertinent negatives include no chest pain, myalgias or shortness of breath. Current antihyperlipidemic treatment includes statins. Risk factors for coronary artery disease include diabetes mellitus, dyslipidemia, hypertension, a sedentary lifestyle, post-menopausal, obesity and family history.  Hypertension This is a chronic problem. The current episode started more than 1 year ago. The problem is controlled. Pertinent negatives include no chest pain, headaches, palpitations or shortness of breath. Risk factors for coronary artery disease include diabetes mellitus, dyslipidemia, family history, obesity, post-menopausal state, sedentary lifestyle and smoking/tobacco exposure. Past treatments include angiotensin blockers.     Review of Systems  Constitutional: Positive for fatigue. Negative for chills, fever and unexpected weight  change.  HENT: Negative for trouble swallowing and voice change.   Eyes: Negative for visual disturbance.  Respiratory: Negative for cough, shortness of breath and wheezing.   Cardiovascular: Negative for chest pain, palpitations and leg swelling.  Gastrointestinal: Negative for diarrhea, nausea and vomiting.  Endocrine: Positive for polydipsia and polyuria. Negative for cold intolerance, heat intolerance and polyphagia.  Musculoskeletal: Negative for arthralgias and myalgias.  Skin: Negative for color change, pallor, rash and wound.  Neurological: Negative for seizures and headaches.  Psychiatric/Behavioral: Negative for confusion and suicidal ideas.    Objective:    Vitals with BMI 07/19/2020 07/14/2020 06/23/2020  Height 5\' 4"  5\' 5"  -  Weight 215 lbs 3 oz 215 lbs -  BMI 09.38 18.29 -  Systolic 937 - 169  Diastolic 77 - 62  Pulse 68 - -    BP (!) 161/77   Pulse 68   Ht 5\' 4"  (1.626 m)   Wt 215 lb 3.2 oz (97.6 kg)   BMI 36.94 kg/m   Wt Readings from Last 3 Encounters:  07/19/20 215 lb 3.2 oz (97.6 kg)  07/14/20 215 lb (97.5 kg)  06/21/20 215 lb 6.4 oz (97.7 kg)     Physical Exam Constitutional:      Appearance: She is well-developed.  HENT:     Head: Normocephalic and atraumatic.  Neck:     Thyroid: No thyromegaly.     Trachea: No tracheal deviation.  Cardiovascular:     Rate and Rhythm: Normal rate and regular rhythm.  Pulmonary:     Effort: Pulmonary effort is normal.  Abdominal:     Tenderness: There is no abdominal tenderness. There is no guarding.  Musculoskeletal:        General: Normal range of motion.     Cervical back: Normal range of motion and neck supple.  Skin:    General: Skin is warm and dry.     Coloration: Skin is not pale.     Findings: No erythema or rash.  Neurological:     Mental Status: She is alert and oriented to person, place, and time.     Cranial Nerves: No cranial nerve deficit.     Coordination: Coordination normal.     Deep Tendon  Reflexes: Reflexes are normal and symmetric.  Psychiatric:     Comments: Reluctant affect.  CMP ( most recent) CMP     Component Value Date/Time   NA 136 04/29/2020 1553   K 3.5 04/29/2020 1553   CL 99 04/29/2020 1553   CO2 27 04/29/2020 1553   GLUCOSE 171 (H) 04/29/2020 1553   BUN 13 04/29/2020 1553   CREATININE 0.71 04/29/2020 1553   CREATININE 0.66 08/27/2016 1521   CALCIUM 9.1 04/29/2020 1553   PROT 8.2 (H) 04/29/2020 1553   ALBUMIN 3.8 04/29/2020 1553   AST 16 04/29/2020 1553   ALT 13 04/29/2020 1553   ALKPHOS 87 04/29/2020 1553   BILITOT 0.4 04/29/2020 1553   GFRNONAA >60 04/29/2020 1553   GFRAA >60 02/09/2020 1054     Diabetic Labs (most recent): Lab Results  Component Value Date   HGBA1C 11.0 (A) 07/19/2020   HGBA1C 10.8 (H) 04/29/2020   HGBA1C 10.5 (H) 02/09/2020     Lipid Panel ( most recent) Lipid Panel     Component Value Date/Time   CHOL 145 04/29/2020 1553   TRIG 155 (H) 04/29/2020 1553   HDL 41 04/29/2020 1553   CHOLHDL 3.5 04/29/2020 1553   VLDL 31 04/29/2020 1553   LDLCALC 73 04/29/2020 1553   LDLDIRECT 119.0 01/18/2016 1038      Lab Results  Component Value Date   TSH 1.23 01/18/2016      Assessment & Plan:   1. Uncontrolled type 2 diabetes mellitus with hyperglycemia ([place])   - Day has currently uncontrolled symptomatic type 2 DM since  60 years of age,  with most recent A1c of 10.8 %. Recent labs reviewed.  She presents with a log showing persistently above target glycemic profile.  She is skipping lunch.  Her point-of-care A1c is 11%, unchanged from her last visit A1c of 10.8% .  She did not document or report hypoglycemia.  - I had a long discussion with her about the progressive nature of diabetes and the pathology behind its complications. -her diabetes is complicated by noncompliance/nonadherence, chronic smoking, obesity/sedentary life and she remains at a high risk for more acute and chronic  complications which include CAD, CVA, CKD, retinopathy, and neuropathy. These are all discussed in detail with her.  - I have counseled her on diet  and weight management  by adopting a carbohydrate restricted/protein rich diet. Patient is encouraged to switch to  unprocessed or minimally processed     complex starch and increased protein intake (animal or plant source), fruits, and vegetables. -  she is advised to stick to a routine mealtimes to eat 3 meals  a day and avoid unnecessary snacks ( to snack only to correct hypoglycemia).   - she acknowledges that there is a room for improvement in her food and drink choices. - Suggestion is made for her to avoid simple carbohydrates  from her diet including Cakes, Sweet Desserts, Ice Cream, Soda (diet and regular), Sweet Tea, Candies, Chips, Cookies, Store Bought Juices, Alcohol in Excess of  1-2 drinks a day, Artificial Sweeteners,  Coffee Creamer, and "Sugar-free" Products, Lemonade. This will help patient to have more stable blood glucose profile and potentially avoid unintended weight gain.    - she will be scheduled with Jearld Fenton, RDN, CDE for diabetes education.  - I have approached her with the following individualized plan to manage  her diabetes and patient agrees:   -In light of her chronic severe hyperglycemia, she will continue to need intensive treatment with basal/bolus insulin in order for her to achieve control of diabetes  to target.  -Accordingly, she is advised to continue Lantus 80 units nightly increase NovoLog 10-16  units 3 times a day with meals  for pre-meal BG readings of 90-150mg /dl, plus patient specific correction dose for unexpected hyperglycemia above 150mg /dl, associated with strict monitoring of glucose 4 times a day-before meals and at bedtime. - she is warned not to take insulin without proper monitoring per orders. - Adjustment parameters are given to her for hypo and hyperglycemia in writing. - she is  encouraged to call clinic for blood glucose levels less than 70 or above 300 mg /dl. - she is advised to continue Metformin 1000 mg p.o. twice daily, therapeutically suitable for patient . -She will benefit from low-dose glipizide.  I discussed and added glipizide 5 mg XL p.o. daily at breakfast. - she is high risk for pancreatitis due to chronic heavy smoking, will avoid incretin therapy for now.   - Specific targets for  A1c;  LDL, HDL,  and Triglycerides were discussed with the patient.  2) Blood Pressure /Hypertension: Her blood pressure is uncontrolled.  Largely related to her chronic heavy smoking.  she is advised to continue her current medications including amlodipine 10 mg p.o. daily, clonidine 0.1 mg p.o. twice daily, losartan 100 mg p.o. daily, metoprolol 100 mg p.o. twice daily.    3) Lipids/Hyperlipidemia:   Review of her recent lipid panel showed uncontrolled  LDL at 73 .  she  is advised to continue atorvastatin 20 mg p.o. daily at bedtime. Side effects and precautions discussed with her.  4)  Weight/Diet:  Body mass index is 36.94 kg/m.  -   clearly complicating her diabetes care.   she is  a candidate for weight loss. I discussed with her the fact that loss of 5 - 10% of her  current body weight will have the most impact on her diabetes management.  Exercise, and detailed carbohydrates information provided  -  detailed on discharge instructions.  5) Chronic Care/Health Maintenance:  -she  is on ACEI/ARB and Statin medications and  is encouraged to initiate and continue to follow up with Ophthalmology, Dentist,  Podiatrist at least yearly or according to recommendations, and advised to  quit smoking. I have recommended yearly flu vaccine and pneumonia vaccine at least every 5 years; moderate intensity exercise for up to 150 minutes weekly; and  sleep for at least 7 hours a day.  The patient was counseled on the dangers of tobacco use, and was advised to quit.  Reviewed strategies to  maximize success, including removing cigarettes and smoking materials from environment.   POCT ABI Results 07/19/20  Her ABI is normal today. Right ABI: 1.17      left ABI: 1.17  Right leg systolic / diastolic: 161/09 mmHg Left leg systolic / diastolic: 604/54 mmHg  Arm systolic / diastolic: 098/11 mmHG This study will be repeated in March 2027, or sooner if needed.  - she is  advised to maintain close follow up with Soyla Dryer, PA-C for primary care needs, as well as her other providers for optimal and coordinated care.   - Time spent in this patient care: 60 min, of which > 50% was spent in  counseling  her about her chronically uncontrolled type 2 diabetes, hyperlipidemia, hypertension, smoking and the rest reviewing her blood glucose logs , discussing her hypoglycemia and hyperglycemia episodes, reviewing her current and  previous labs / studies  ( including abstraction from other facilities) and medications  doses and developing a  long term treatment plan based on the latest standards of care/ guidelines; and documenting her care.    Please refer to Patient Instructions for Blood Glucose Monitoring and Insulin/Medications Dosing Guide"  in media tab for additional information. Please  also refer to " Patient Self Inventory" in the Media  tab for reviewed elements of pertinent patient history.  [place] participated in the discussions, expressed understanding, and voiced agreement with the above plans.  All questions were answered to her satisfaction. she is encouraged to contact clinic should she have any questions or concerns prior to her return visit.   Follow up plan: - Return in about 3 months (around 10/19/2020) for F/U with Pre-visit Labs, Meter, Logs, A1c here.Glade Lloyd, MD [facility] [address] [place], [place] [address] Phone: [phone]  Fax: [phone]    07/19/2020, 5:25 PM  This note was  partially dictated with voice recognition software. Similar sounding words can be transcribed inadequately or may not  be corrected upon review.

## 2020-07-26 ENCOUNTER — Other Ambulatory Visit: Payer: Self-pay

## 2020-07-26 ENCOUNTER — Telehealth: Payer: Self-pay

## 2020-07-26 DIAGNOSIS — E1165 Type 2 diabetes mellitus with hyperglycemia: Secondary | ICD-10-CM

## 2020-07-26 MED ORDER — GLIPIZIDE 5 MG PO TABS: 5.0000 mg | ORAL_TABLET | Freq: Two times a day (BID) | ORAL | 3 refills | Status: DC

## 2020-07-26 NOTE — Telephone Encounter (Signed)
Patient's daughter, Tandy Gaw would like a call back. She is on the DPR to speak with. [phone]

## 2020-07-26 NOTE — Telephone Encounter (Signed)
She can take the immediate release 5mg  po BID with meals.

## 2020-07-26 NOTE — Telephone Encounter (Signed)
Sent in for immediate release and notified patient daughter

## 2020-07-26 NOTE — Telephone Encounter (Signed)
Patient daughter stated that med assist does not cover/ carry the Glipizide ER just the Immediate release. If she can only have the ER the family will need it sent to [facility] in [place], if it can be changed to Immediate release then script needs to be changed, please advise.

## 2020-08-25 ENCOUNTER — Ambulatory Visit: Payer: Self-pay | Admitting: Nutrition

## 2020-08-31 ENCOUNTER — Other Ambulatory Visit: Payer: Self-pay | Admitting: Physician Assistant

## 2020-08-31 DIAGNOSIS — E1165 Type 2 diabetes mellitus with hyperglycemia: Secondary | ICD-10-CM

## 2020-08-31 DIAGNOSIS — I10 Essential (primary) hypertension: Secondary | ICD-10-CM

## 2020-08-31 DIAGNOSIS — E785 Hyperlipidemia, unspecified: Secondary | ICD-10-CM

## 2020-09-05 ENCOUNTER — Other Ambulatory Visit: Payer: Self-pay | Admitting: Orthopedic Surgery

## 2020-09-05 DIAGNOSIS — M7582 Other shoulder lesions, left shoulder: Secondary | ICD-10-CM

## 2020-09-05 DIAGNOSIS — G8929 Other chronic pain: Secondary | ICD-10-CM

## 2020-09-05 DIAGNOSIS — M25512 Pain in left shoulder: Secondary | ICD-10-CM

## 2020-09-05 DIAGNOSIS — M7581 Other shoulder lesions, right shoulder: Secondary | ICD-10-CM

## 2020-09-05 DIAGNOSIS — M25511 Pain in right shoulder: Secondary | ICD-10-CM

## 2020-09-05 MED ORDER — MELOXICAM 7.5 MG PO TABS: 7.5000 mg | ORAL_TABLET | Freq: Every day | ORAL | 5 refills | Status: DC

## 2020-09-05 NOTE — Telephone Encounter (Signed)
Patient called to relay she is having recurring shoulder pain, both right and left; asking if a refill may be sent to  [facility], for medication: meloxicam (MOBIC) 7.5 MG tablet 30 tablet   - previously ordered through [facility] in 04/18/20 with refills   - if another appointment is also recommended, please advise.

## 2020-09-07 ENCOUNTER — Other Ambulatory Visit: Payer: Self-pay

## 2020-09-07 ENCOUNTER — Other Ambulatory Visit (HOSPITAL_COMMUNITY)
Admission: RE | Admit: 2020-09-07 | Discharge: 2020-09-07 | Disposition: A | Discharge status: Home / Self Care | Payer: Self-pay | Source: Ambulatory Visit | Attending: Physician Assistant | Admitting: Physician Assistant

## 2020-09-07 DIAGNOSIS — E785 Hyperlipidemia, unspecified: Secondary | ICD-10-CM | POA: Insufficient documentation

## 2020-09-07 DIAGNOSIS — I10 Essential (primary) hypertension: Secondary | ICD-10-CM | POA: Insufficient documentation

## 2020-09-07 DIAGNOSIS — E1165 Type 2 diabetes mellitus with hyperglycemia: Secondary | ICD-10-CM | POA: Insufficient documentation

## 2020-09-07 LAB — COMPREHENSIVE METABOLIC PANEL
ALT: 18 U/L (ref 0–44)
AST: 24 U/L (ref 15–41)
Albumin: 3.9 g/dL (ref 3.5–5.0)
Alkaline Phosphatase: 71 U/L (ref 38–126)
Anion gap: 11 (ref 5–15)
BUN: 17 mg/dL (ref 8–23)
CO2: 24 mmol/L (ref 22–32)
Calcium: 9.2 mg/dL (ref 8.9–10.3)
Chloride: 105 mmol/L (ref 98–111)
Creatinine, Ser: 0.85 mg/dL (ref 0.44–1.00)
GFR, Estimated: >60 mL/min (ref >60)
Glucose, Bld: 85 mg/dL (ref 70–99)
Potassium: 3.8 mmol/L (ref 3.5–5.1)
Sodium: 140 mmol/L (ref 135–145)
Total Bilirubin: 0.6 mg/dL (ref 0.3–1.2)
Total Protein: 8.3 g/dL — ABNORMAL HIGH (ref 6.5–8.1)

## 2020-09-07 LAB — LIPID PANEL
Cholesterol: 155 mg/dL (ref 0–200)
HDL: 41 mg/dL (ref >40)
LDL Cholesterol: 79 mg/dL (ref 0–99)
Total CHOL/HDL Ratio: 3.8 RATIO
Triglycerides: 173 mg/dL — ABNORMAL HIGH (ref <150)
VLDL: 35 mg/dL (ref 0–40)

## 2020-09-13 ENCOUNTER — Encounter: Payer: Self-pay | Admitting: Physician Assistant

## 2020-09-13 ENCOUNTER — Ambulatory Visit: Payer: HRSA Program | Admitting: Physician Assistant

## 2020-09-13 ENCOUNTER — Other Ambulatory Visit: Payer: Self-pay

## 2020-09-13 VITALS — BP 147/74 | HR 68 | Temp 96.5°F

## 2020-09-13 DIAGNOSIS — R609 Edema, unspecified: Secondary | ICD-10-CM

## 2020-09-13 DIAGNOSIS — E669 Obesity, unspecified: Secondary | ICD-10-CM

## 2020-09-13 DIAGNOSIS — F17200 Nicotine dependence, unspecified, uncomplicated: Secondary | ICD-10-CM

## 2020-09-13 DIAGNOSIS — I10 Essential (primary) hypertension: Secondary | ICD-10-CM

## 2020-09-13 DIAGNOSIS — Z5320 Procedure and treatment not carried out because of patient's decision for unspecified reasons: Secondary | ICD-10-CM

## 2020-09-13 DIAGNOSIS — E785 Hyperlipidemia, unspecified: Secondary | ICD-10-CM

## 2020-09-13 DIAGNOSIS — E1165 Type 2 diabetes mellitus with hyperglycemia: Secondary | ICD-10-CM

## 2020-09-13 MED ORDER — HYDRALAZINE HCL 10 MG PO TABS: 10.0000 mg | ORAL_TABLET | Freq: Three times a day (TID) | ORAL | 0 refills | Status: DC

## 2020-09-13 MED ORDER — HYDROCHLOROTHIAZIDE 12.5 MG PO CAPS: 12.5000 mg | ORAL_CAPSULE | Freq: Every day | ORAL | 0 refills | Status: DC

## 2020-09-13 NOTE — Progress Notes (Signed)
BP (!) 147/74   Pulse 68   Temp (!) 96.5 F (35.8 C)   SpO2 97%    Subjective:    Patient ID: Casey Johnston, female    DOB: Nov 23, 1960, 60 y.o.   MRN: [id number]  HPI: Casey Johnston is a 60 y.o. female presenting on 09/13/2020 for Hypertension and Hyperlipidemia   HPI   Pt had a negative covid 19 screening questionnaire.   Chief Complaint  Patient presents with  . Hypertension  . Hyperlipidemia     She is seeing endocrinology for DM  She thinks the amlodipine is causing edema.  She is still Not vaccinated for covid   She is Still smoking  She isn't standing much any longer at work, < an hour each day  She says she is doing well other than the LE edema.      Relevant past medical, surgical, family and social history reviewed and updated as indicated. Interim medical history since our last visit reviewed. Allergies and medications reviewed and updated.    Current Outpatient Medications:  .  amLODipine (NORVASC) 10 MG tablet, TAKE 1 Tablet BY MOUTH ONCE DAILY, Disp: 90 tablet, Rfl: 1 .  atorvastatin (LIPITOR) 20 MG tablet, TAKE 1 Tablet BY MOUTH ONCE EVERY DAY, Disp: 90 tablet, Rfl: 1 .  cloNIDine (CATAPRES) 0.1 MG tablet, TAKE 1 Tablet  BY MOUTH TWICE DAILY, Disp: 180 tablet, Rfl: 1 .  glipiZIDE (GLUCOTROL) 5 MG tablet, Take 1 tablet (5 mg total) by mouth 2 (two) times daily before a meal., Disp: 180 tablet, Rfl: 3 .  insulin aspart (NOVOLOG) 100 UNIT/ML injection, Inject 10-16 Units into the skin 3 (three) times daily before meals., Disp: 15 mL, Rfl: 2 .  insulin glargine (LANTUS) 100 UNIT/ML injection, Inject 80 Units into the skin at bedtime., Disp: , Rfl:  .  losartan (COZAAR) 100 MG tablet, TAKE 1 Tablet BY MOUTH ONCE EVERY DAY, Disp: 90 tablet, Rfl: 1 .  lubiprostone (AMITIZA) 24 MCG capsule, 1 PO QHS FOR 7 DAYS THEN 1 PO BID. TAKE WITH FOOD. (Patient taking differently: Take 24 mcg by mouth as needed.), Disp: 62 capsule, Rfl: 11 .  meloxicam  (MOBIC) 7.5 MG tablet, Take 1 tablet (7.5 mg total) by mouth daily., Disp: 30 tablet, Rfl: 5 .  metFORMIN (GLUCOPHAGE) 1000 MG tablet, TAKE 1 Tablet  BY MOUTH TWICE DAILY WITH A MEAL, Disp: 180 tablet, Rfl: 1 .  metoprolol tartrate (LOPRESSOR) 100 MG tablet, TAKE 1 Tablet  BY MOUTH TWICE DAILY, Disp: 180 tablet, Rfl: 1 .  Omega-3 Fatty Acids (FISH OIL) 1000 MG CAPS, Take 1,000 mg by mouth daily., Disp: , Rfl:  .  pantoprazole (PROTONIX) 40 MG tablet, Take 1 tablet (40 mg total) by mouth daily., Disp: 30 tablet, Rfl: 11 .  PROVENTIL HFA 108 (90 Base) MCG/ACT inhaler, INHALE 2 PUFFS BY MOUTH EVERY 6 HOURS AS NEEDED FOR COUGHING, WHEEZING, OR SHORTNESS OF BREATH, Disp: 20.1 g, Rfl: 1     Review of Systems  Per HPI unless specifically indicated above     Objective:    BP (!) 147/74   Pulse 68   Temp (!) 96.5 F (35.8 C)   SpO2 97%   Wt Readings from Last 3 Encounters:  07/19/20 215 lb 3.2 oz (97.6 kg)  07/14/20 215 lb (97.5 kg)  06/21/20 215 lb 6.4 oz (97.7 kg)    Physical Exam Vitals reviewed.  Constitutional:      General: She is not in acute  distress.    Appearance: She is well-developed. She is not toxic-appearing.  HENT:     Head: Normocephalic and atraumatic.  Cardiovascular:     Rate and Rhythm: Normal rate and regular rhythm.  Pulmonary:     Effort: Pulmonary effort is normal.     Breath sounds: Normal breath sounds.  Abdominal:     General: Bowel sounds are normal.     Palpations: Abdomen is soft. There is no mass.     Tenderness: There is no abdominal tenderness.  Musculoskeletal:     Cervical back: Neck supple.     Right lower leg: Edema present.     Left lower leg: Edema present.  Lymphadenopathy:     Cervical: No cervical adenopathy.  Skin:    General: Skin is warm and dry.  Neurological:     Mental Status: She is alert and oriented to person, place, and time.  Psychiatric:        Behavior: Behavior normal.         Results for orders placed or  performed during the hospital encounter of 09/07/20  Lipid panel  Result Value Ref Range   Cholesterol 155 0 - 200 mg/dL   Triglycerides 173 (H) <150 mg/dL   HDL 41 >40 mg/dL   Total CHOL/HDL Ratio 3.8 RATIO   VLDL 35 0 - 40 mg/dL   LDL Cholesterol 79 0 - 99 mg/dL  Comprehensive metabolic panel  Result Value Ref Range   Sodium 140 135 - 145 mmol/L   Potassium 3.8 3.5 - 5.1 mmol/L   Chloride 105 98 - 111 mmol/L   CO2 24 22 - 32 mmol/L   Glucose, Bld 85 70 - 99 mg/dL   BUN 17 8 - 23 mg/dL   Creatinine, Ser 0.85 0.44 - 1.00 mg/dL   Calcium 9.2 8.9 - 10.3 mg/dL   Total Protein 8.3 (H) 6.5 - 8.1 g/dL   Albumin 3.9 3.5 - 5.0 g/dL   AST 24 15 - 41 U/L   ALT 18 0 - 44 U/L   Alkaline Phosphatase 71 38 - 126 U/L   Total Bilirubin 0.6 0.3 - 1.2 mg/dL   GFR, Estimated >60 >60 mL/min   Anion gap 11 5 - 15      Assessment & Plan:   Encounter Diagnoses  Name Primary?  . Essential hypertension, benign Yes  . Hyperlipidemia, unspecified hyperlipidemia type   . Uncontrolled type 2 diabetes mellitus with hyperglycemia ([place])   . Tobacco use disorder   . Obesity, unspecified classification, unspecified obesity type, unspecified whether serious comorbidity present   . Edema, unspecified type   . Screening mammography declined      -Reviewed labs with pt -pt to Continue with endocrinology for DM -pt to Stop amlodipine due to edema -she will continue for the HTN: Clonidine 0.1mg  bid Losartan 10mg  metoprilol 100bg bid -will Add hctz 12.5mg  and hydralazine -pt declined screening mammogram; she prefers to get it every other year -DM eye exam is UTD.  She went recently for that -encouraged pt to get covid vaccination -pt to follow up 1 month recheck bp.  She will need to have bmp.  She is to contact office sooner prn

## 2020-09-29 ENCOUNTER — Encounter: Payer: Self-pay | Admitting: Physician Assistant

## 2020-10-10 ENCOUNTER — Other Ambulatory Visit: Payer: Self-pay | Admitting: Orthopedic Surgery

## 2020-10-10 DIAGNOSIS — M7581 Other shoulder lesions, right shoulder: Secondary | ICD-10-CM

## 2020-10-10 DIAGNOSIS — M7582 Other shoulder lesions, left shoulder: Secondary | ICD-10-CM

## 2020-10-10 DIAGNOSIS — G8929 Other chronic pain: Secondary | ICD-10-CM

## 2020-10-11 ENCOUNTER — Other Ambulatory Visit (HOSPITAL_COMMUNITY)
Admission: RE | Admit: 2020-10-11 | Discharge: 2020-10-11 | Disposition: A | Discharge status: Home / Self Care | Payer: Self-pay | Source: Ambulatory Visit | Attending: "Endocrinology | Admitting: "Endocrinology

## 2020-10-11 ENCOUNTER — Other Ambulatory Visit: Payer: Self-pay

## 2020-10-11 DIAGNOSIS — E1165 Type 2 diabetes mellitus with hyperglycemia: Secondary | ICD-10-CM | POA: Insufficient documentation

## 2020-10-11 LAB — COMPREHENSIVE METABOLIC PANEL
ALT: 18 U/L (ref 0–44)
AST: 20 U/L (ref 15–41)
Albumin: 3.9 g/dL (ref 3.5–5.0)
Alkaline Phosphatase: 74 U/L (ref 38–126)
Anion gap: 9 (ref 5–15)
BUN: 18 mg/dL (ref 8–23)
CO2: 27 mmol/L (ref 22–32)
Calcium: 8.9 mg/dL (ref 8.9–10.3)
Chloride: 104 mmol/L (ref 98–111)
Creatinine, Ser: 0.87 mg/dL (ref 0.44–1.00)
GFR, Estimated: >60 mL/min (ref >60)
Glucose, Bld: 104 mg/dL — ABNORMAL HIGH (ref 70–99)
Potassium: 3.8 mmol/L (ref 3.5–5.1)
Sodium: 140 mmol/L (ref 135–145)
Total Bilirubin: 0.6 mg/dL (ref 0.3–1.2)
Total Protein: 8 g/dL (ref 6.5–8.1)

## 2020-10-11 LAB — T4, FREE: Free T4: 0.69 ng/dL (ref 0.61–1.12)

## 2020-10-11 LAB — TSH: TSH: 3.547 u[IU]/mL (ref 0.350–4.500)

## 2020-10-13 ENCOUNTER — Other Ambulatory Visit: Payer: Self-pay

## 2020-10-13 ENCOUNTER — Encounter (HOSPITAL_COMMUNITY): Payer: Self-pay | Admitting: Emergency Medicine

## 2020-10-13 ENCOUNTER — Emergency Department (HOSPITAL_COMMUNITY)
Admission: EM | Admit: 2020-10-13 | Discharge: 2020-10-13 | Disposition: A | Discharge status: Home / Self Care | Payer: Self-pay | Attending: Emergency Medicine | Admitting: Emergency Medicine

## 2020-10-13 ENCOUNTER — Emergency Department (HOSPITAL_COMMUNITY): Payer: Self-pay

## 2020-10-13 DIAGNOSIS — R1012 Left upper quadrant pain: Secondary | ICD-10-CM | POA: Insufficient documentation

## 2020-10-13 DIAGNOSIS — Z794 Long term (current) use of insulin: Secondary | ICD-10-CM | POA: Insufficient documentation

## 2020-10-13 DIAGNOSIS — E119 Type 2 diabetes mellitus without complications: Secondary | ICD-10-CM | POA: Insufficient documentation

## 2020-10-13 DIAGNOSIS — I10 Essential (primary) hypertension: Secondary | ICD-10-CM

## 2020-10-13 DIAGNOSIS — Z79899 Other long term (current) drug therapy: Secondary | ICD-10-CM | POA: Insufficient documentation

## 2020-10-13 DIAGNOSIS — F17210 Nicotine dependence, cigarettes, uncomplicated: Secondary | ICD-10-CM | POA: Insufficient documentation

## 2020-10-13 DIAGNOSIS — H1031 Unspecified acute conjunctivitis, right eye: Secondary | ICD-10-CM

## 2020-10-13 DIAGNOSIS — B029 Zoster without complications: Secondary | ICD-10-CM

## 2020-10-13 DIAGNOSIS — Z7984 Long term (current) use of oral hypoglycemic drugs: Secondary | ICD-10-CM | POA: Insufficient documentation

## 2020-10-13 LAB — COMPREHENSIVE METABOLIC PANEL
ALT: 22 U/L (ref 0–44)
AST: 26 U/L (ref 15–41)
Albumin: 3.9 g/dL (ref 3.5–5.0)
Alkaline Phosphatase: 74 U/L (ref 38–126)
Anion gap: 8 (ref 5–15)
BUN: 10 mg/dL (ref 8–23)
CO2: 26 mmol/L (ref 22–32)
Calcium: 9 mg/dL (ref 8.9–10.3)
Chloride: 101 mmol/L (ref 98–111)
Creatinine, Ser: 0.73 mg/dL (ref 0.44–1.00)
GFR, Estimated: >60 mL/min (ref >60)
Glucose, Bld: 105 mg/dL — ABNORMAL HIGH (ref 70–99)
Potassium: 3.5 mmol/L (ref 3.5–5.1)
Sodium: 135 mmol/L (ref 135–145)
Total Bilirubin: 0.5 mg/dL (ref 0.3–1.2)
Total Protein: 8.5 g/dL — ABNORMAL HIGH (ref 6.5–8.1)

## 2020-10-13 LAB — CBC WITH DIFFERENTIAL/PLATELET
Abs Immature Granulocytes: 0.02 10*3/uL (ref 0.00–0.07)
Basophils Absolute: 0.1 10*3/uL (ref 0.0–0.1)
Basophils Relative: 1 %
Eosinophils Absolute: 0 10*3/uL (ref 0.0–0.5)
Eosinophils Relative: 1 %
HCT: 40 % (ref 36.0–46.0)
Hemoglobin: 12.8 g/dL (ref 12.0–15.0)
Immature Granulocytes: 0 %
Lymphocytes Relative: 29 %
Lymphs Abs: 1.8 10*3/uL (ref 0.7–4.0)
MCH: 28.4 pg (ref 26.0–34.0)
MCHC: 32 g/dL (ref 30.0–36.0)
MCV: 88.9 fL (ref 80.0–100.0)
Monocytes Absolute: 0.3 10*3/uL (ref 0.1–1.0)
Monocytes Relative: 5 %
Neutro Abs: 4 10*3/uL (ref 1.7–7.7)
Neutrophils Relative %: 64 %
Platelets: 225 10*3/uL (ref 150–400)
RBC: 4.5 MIL/uL (ref 3.87–5.11)
RDW: 14.2 % (ref 11.5–15.5)
WBC: 6.3 10*3/uL (ref 4.0–10.5)
nRBC: 0 % (ref 0.0–0.2)

## 2020-10-13 LAB — URINALYSIS, ROUTINE W REFLEX MICROSCOPIC
Bilirubin Urine: NEGATIVE
Glucose, UA: NEGATIVE mg/dL
Ketones, ur: 5 mg/dL — AB
Leukocytes,Ua: NEGATIVE
Nitrite: NEGATIVE
Protein, ur: 100 mg/dL — AB
Specific Gravity, Urine: 1.016 (ref 1.005–1.030)
pH: 5 (ref 5.0–8.0)

## 2020-10-13 LAB — LIPASE, BLOOD: Lipase: 20 U/L (ref 11–51)

## 2020-10-13 MED ORDER — NAPROXEN 500 MG PO TABS: 500.0000 mg | ORAL_TABLET | Freq: Two times a day (BID) | ORAL | 0 refills | Status: DC

## 2020-10-13 MED ORDER — HYDRALAZINE HCL 25 MG PO TABS
25.0000 mg | ORAL_TABLET | Freq: Three times a day (TID) | ORAL | Status: DC
  Administered 2020-10-13: 25 mg via ORAL
  Filled 2020-10-13: qty 1

## 2020-10-13 MED ORDER — POLYMYXIN B-TRIMETHOPRIM 10000-0.1 UNIT/ML-% OP SOLN
1.0000 [drp] | OPHTHALMIC | Status: DC
  Administered 2020-10-13: 1 [drp] via OPHTHALMIC
  Filled 2020-10-13: qty 10

## 2020-10-13 MED ORDER — VALACYCLOVIR HCL 1 G PO TABS: 1000.0000 mg | ORAL_TABLET | Freq: Three times a day (TID) | ORAL | 0 refills | Status: DC

## 2020-10-13 MED ORDER — FENTANYL CITRATE (PF) 100 MCG/2ML IJ SOLN
50.0000 ug | Freq: Once | INTRAMUSCULAR | Status: AC
  Administered 2020-10-13: 50 ug via INTRAVENOUS
  Filled 2020-10-13: qty 2

## 2020-10-13 MED ORDER — SODIUM CHLORIDE 0.9 % IV SOLN: INTRAVENOUS | Status: DC

## 2020-10-13 NOTE — Discharge Instructions (Addendum)
Continue polytrim drops to the R eye every 4 hours while awake - see your eye doctor in 2 days for recheck.  ER for worsening redness / drainage / eye pain. Shingles treatment is Valtrex one tablet 3 times daily for 7 days Read the attachments about shingles Take naprosyn for pain Take all of your blood pressure medicines including the Hydralazine Talk to your doctor about receiving the shingles vaccine when you follow up in 1 week.  Your CT scan was reassuring - no signs of pancreatitis or liver / gall bladder disease -   ER for worsening symtpoms.

## 2020-10-13 NOTE — ED Triage Notes (Signed)
Pt c/o left abd and flank pain with nausea x 3 days, worsening today, denies fever, v/d

## 2020-10-13 NOTE — ED Provider Notes (Signed)
[facility] Provider Note   CSN: [id number] Arrival date & time: 10/13/20  1900     History Chief Complaint  Patient presents with  . Abdominal Pain    Casey Johnston is a 60 y.o. female.  HPI   This patient is a 60 year old female, she has a known history of diabetes as well as hypertension, she has had a colonoscopy in the past, I have reviewed the medical record and found that this colonoscopy performed in 2020 showed multiple small and large mouth diverticula in the sigmoid colon.  She reports that approximately 2 weeks ago she started to develop some mild discomfort in her abdomen, this initially was unremarkable however over the last couple of weeks this is grown to be more intense, it is left upper left lower quadrants as well as some radiation to the back.  She has no urinary symptoms, no bowel symptoms, she is not vomiting but has not had much to eat today and has not taken her antihypertensive since Monday, approximately 4 days ago thinking that it was the hydralazine which was a relatively new medication for her that was causing this.  Subsequently she has become hypertensive.  Symptoms are persistent, no medication given prior to arrival, she has not seen her family doctor for this  Past Medical History:  Diagnosis Date  . Anxiety and depression   . Diabetes mellitus, type II ([place])    Dr. Dorris Fetch managing  . Frequent headaches chronic   ibuprofen helps some  . History of UTI    Recurrent per pt (approx 3 per year)  . Hyperlipidemia   . Hypertension   . Leukocytosis 12/2015   mild lymphocytosis.  Path smear review reassuring.  Repeat 01/2016 stable.  . Morbid obesity ([place])   . OSA on CPAP     Patient Active Problem List   Diagnosis Date Noted  . Uncontrolled type 2 diabetes mellitus with hyperglycemia ([place]) 06/21/2020  . Abnormal CT of the abdomen 06/24/2019  . Current smoker 10/07/2018  . Perianal abscess 10/06/2018  . Hypokalemia 10/06/2018   . Nausea without vomiting 07/30/2018  . Unintentional weight loss 07/30/2018  . Constipation 06/05/2018  . GERD (gastroesophageal reflux disease) 06/05/2018  . Abdominal pain 06/05/2018  . Uncontrolled type 2 diabetes mellitus with complication, without long-term current use of insulin ([place]) 08/22/2015  . Mixed hyperlipidemia 08/22/2015  . Essential hypertension, benign 08/22/2015  . Morbid obesity due to excess calories ([place]) 08/22/2015  . Personal history of noncompliance with medical treatment, presenting hazards to health 08/22/2015    Past Surgical History:  Procedure Laterality Date  . ABDOMINAL HYSTERECTOMY  1998   Dysfunctional uterine bleeding.  No hx of abnormal paps.  Marland Kitchen BIOPSY  10/20/2018   Procedure: BIOPSY;  Surgeon: Danie Binder, MD;  Location: [facility];  Service: Endoscopy;;  stomach  . COLONOSCOPY  2012   Dr. Earley Brooke: 1.5 cm pendunculated descending colon polyp (tubulovillous adenoma) and diminutive hyperplastic polyps  . COLONOSCOPY N/A 10/20/2018   Procedure: COLONOSCOPY;  Surgeon: Danie Binder, MD; 4 tubular adenomas, diverticulosis in the sigmoid colon, tortuous left colon, external and internal hemorrhoids. Due for repeat in 2023.   Marland Kitchen ESOPHAGOGASTRODUODENOSCOPY N/A 10/20/2018   Procedure: ESOPHAGOGASTRODUODENOSCOPY (EGD);  Surgeon: Danie Binder, MD;  gastritis and duodenitis. No H. Pylori.   Marland Kitchen POLYPECTOMY  10/20/2018   Procedure: POLYPECTOMY;  Surgeon: Danie Binder, MD;  Location: [facility];  Service: Endoscopy;;  . wart removal  08/04/2019  benign     OB History    Gravida  3   Para  1   Term  1   Preterm      AB  2   Living        SAB  2   IAB      Ectopic      Multiple      Live Births              Family History  Problem Relation Age of Onset  . Cancer Mother        ovarian  . COPD Father   . Diabetes Father   . Hyperlipidemia Father   . Heart attack Father   . Heart failure Father   . Colon cancer Neg Hx      Social History   Tobacco Use  . Smoking status: Current Every Day Smoker    Packs/day: 0.25    Years: 20.00    Pack years: 5.00    Types: Cigarettes  . Smokeless tobacco: Never Used  Vaping Use  . Vaping Use: Never used  Substance Use Topics  . Alcohol use: No  . Drug use: No    Home Medications Prior to Admission medications   Medication Sig Start Date End Date Taking? Authorizing Provider  valACYclovir (VALTREX) 1000 MG tablet Take 1 tablet (1,000 mg total) by mouth 3 (three) times daily. 10/13/20  Yes Noemi Chapel, MD  atorvastatin (LIPITOR) 20 MG tablet TAKE 1 Tablet BY MOUTH ONCE EVERY DAY 07/18/20   Soyla Dryer, PA-C  cloNIDine (CATAPRES) 0.1 MG tablet TAKE 1 Tablet  BY MOUTH TWICE DAILY 07/18/20   Soyla Dryer, PA-C  glipiZIDE (GLUCOTROL) 5 MG tablet Take 1 tablet (5 mg total) by mouth 2 (two) times daily before a meal. 07/26/20   Nida, Marella Chimes, MD  hydrALAZINE (APRESOLINE) 10 MG tablet Take 1 tablet (10 mg total) by mouth 3 (three) times daily. 09/13/20   Soyla Dryer, PA-C  hydrochlorothiazide (MICROZIDE) 12.5 MG capsule Take 1 capsule (12.5 mg total) by mouth daily. 09/13/20   Soyla Dryer, PA-C  insulin aspart (NOVOLOG) 100 UNIT/ML injection Inject 10-16 Units into the skin 3 (three) times daily before meals. 07/19/20   Cassandria Anger, MD  insulin glargine (LANTUS) 100 UNIT/ML injection Inject 80 Units into the skin at bedtime.    [provider]  losartan (COZAAR) 100 MG tablet TAKE 1 Tablet BY MOUTH ONCE EVERY DAY 07/18/20   Soyla Dryer, PA-C  lubiprostone (AMITIZA) 24 MCG capsule 1 PO QHS FOR 7 DAYS THEN 1 PO BID. TAKE WITH FOOD. Patient taking differently: Take 24 mcg by mouth as needed. 10/20/18   Danie Binder, MD  meloxicam (MOBIC) 15 MG tablet TAKE 1/2 Tablet BY MOUTH ONCE EVERY DAY 10/10/20   Carole Civil, MD  meloxicam (MOBIC) 7.5 MG tablet Take 1 tablet (7.5 mg total) by mouth daily. 09/05/20   Carole Civil,  MD  metFORMIN (GLUCOPHAGE) 1000 MG tablet TAKE 1 Tablet  BY MOUTH TWICE DAILY WITH A MEAL 07/18/20   Soyla Dryer, PA-C  metoprolol tartrate (LOPRESSOR) 100 MG tablet TAKE 1 Tablet  BY MOUTH TWICE DAILY 07/18/20   Soyla Dryer, PA-C  Omega-3 Fatty Acids (FISH OIL) 1000 MG CAPS Take 1,000 mg by mouth daily.    [provider]  pantoprazole (PROTONIX) 40 MG tablet Take 1 tablet (40 mg total) by mouth daily. 07/18/20   Erenest Rasher, PA-C  PROVENTIL HFA 108 [phone]  Base) MCG/ACT inhaler INHALE 2 PUFFS BY MOUTH EVERY 6 HOURS AS NEEDED FOR COUGHING, WHEEZING, OR SHORTNESS OF BREATH 07/18/20   Soyla Dryer, PA-C    Allergies    Januvia [sitagliptin]  Review of Systems   Review of Systems  All other systems reviewed and are negative.   Physical Exam Updated Vital Signs BP (!) 190/78   Pulse 90   Temp 99.9 F (37.7 C) (Oral)   Resp 18   Ht 1.626 m (5\' 4" )   Wt 90.7 kg   SpO2 96%   BMI 34.33 kg/m   Physical Exam Vitals and nursing note reviewed.  Constitutional:      General: She is not in acute distress.    Appearance: She is well-developed.  HENT:     Head: Normocephalic and atraumatic.     Mouth/Throat:     Pharynx: No oropharyngeal exudate.  Eyes:     General: No scleral icterus.       Right eye: Discharge present.        Left eye: No discharge.     Pupils: Pupils are equal, round, and reactive to light.  Neck:     Thyroid: No thyromegaly.     Vascular: No JVD.  Cardiovascular:     Rate and Rhythm: Normal rate and regular rhythm.     Heart sounds: Normal heart sounds. No murmur heard. No friction rub. No gallop.   Pulmonary:     Effort: Pulmonary effort is normal. No respiratory distress.     Breath sounds: Normal breath sounds. No wheezing or rales.  Abdominal:     General: Bowel sounds are normal. There is no distension.     Palpations: Abdomen is soft. There is no mass.     Tenderness: There is abdominal tenderness.     Comments: Shingles rash  to the L back just below the bra strap - wraps the L side - no rash on the L anterior abd.  Musculoskeletal:        General: No tenderness. Normal range of motion.     Cervical back: Normal range of motion and neck supple.     Right lower leg: No edema.     Left lower leg: No edema.  Lymphadenopathy:     Cervical: No cervical adenopathy.  Skin:    General: Skin is warm and dry.     Findings: No erythema or rash.  Neurological:     Mental Status: She is alert.     Coordination: Coordination normal.  Psychiatric:        Behavior: Behavior normal.     ED Results / Procedures / Treatments   Labs (all labs ordered are listed, but only abnormal results are displayed) Labs Reviewed  COMPREHENSIVE METABOLIC PANEL - Abnormal; Notable for the following components:      Result Value   Glucose, Bld 105 (*)    Total Protein 8.5 (*)    All other components within normal limits  URINALYSIS, ROUTINE W REFLEX MICROSCOPIC - Abnormal; Notable for the following components:   Hgb urine dipstick SMALL (*)    Ketones, ur 5 (*)    Protein, ur 100 (*)    Bacteria, UA RARE (*)    All other components within normal limits  LIPASE, BLOOD  CBC WITH DIFFERENTIAL/PLATELET    EKG None  Radiology CT ABDOMEN PELVIS WO CONTRAST  Result Date: 10/13/2020 CLINICAL DATA:  60 year old female with concern for acute diverticulitis. EXAM: CT ABDOMEN AND PELVIS WITHOUT CONTRAST  TECHNIQUE: Multidetector CT imaging of the abdomen and pelvis was performed following the standard protocol without IV contrast. COMPARISON:  CT abdomen pelvis dated 10/06/2018. CT abdomen pelvis dated 03/04/2018. FINDINGS: Evaluation of this exam is limited in the absence of intravenous contrast. Lower chest: Bibasilar linear atelectasis/scarring. The visualized lung bases are otherwise clear. No intra-abdominal free air or free fluid. Hepatobiliary: No focal liver abnormality is seen. No gallstones, gallbladder wall thickening, or biliary  dilatation. Pancreas: Unremarkable. No pancreatic ductal dilatation or surrounding inflammatory changes. Spleen: Normal in size without focal abnormality. Adrenals/Urinary Tract: The adrenal glands unremarkable. Bilobed appearing right renal interpolar cyst similar to prior CT. There is a punctate nonobstructing right renal interpolar calculus. No hydronephrosis. The left kidney is unremarkable. The visualized ureters and urinary bladder appear unremarkable. Stomach/Bowel: There is sigmoid diverticulosis without active inflammatory changes. There is no bowel obstruction or active inflammation. The appendix is normal. Vascular/Lymphatic: Minimal aortoiliac atherosclerotic disease. The IVC is unremarkable. No portal venous gas. There is no adenopathy. Reproductive: Hysterectomy. No adnexal masses. Other: A 2.3 cm ovoid lesion in the subcutaneous soft tissues of the mons pubis along the right groin which is not characterized but possibly a sebaceous cyst. Ultrasound may provide better evaluation. Musculoskeletal: Degenerative changes at L5-S1. No acute osseous pathology. IMPRESSION: 1. No acute intra-abdominal or pelvic pathology. 2. Sigmoid diverticulosis. No bowel obstruction. Normal appendix. 3. Punctate nonobstructing right renal interpolar calculus. No hydronephrosis. 4. Aortic Atherosclerosis (ICD10-I70.0). Electronically Signed   By: Anner Crete M.D.   On: 10/13/2020 20:27    Procedures Procedures   Medications Ordered in ED Medications  0.9 %  sodium chloride infusion ( Intravenous New Bag/Given 10/13/20 2000)  trimethoprim-polymyxin b (POLYTRIM) ophthalmic solution 1 drop (has no administration in time range)  hydrALAZINE (APRESOLINE) tablet 25 mg (25 mg Oral Given 10/13/20 2030)  fentaNYL (SUBLIMAZE) injection 50 mcg (50 mcg Intravenous Given 10/13/20 1958)    ED Course  I have reviewed the triage vital signs and the nursing notes.  Pertinent labs & imaging results that were available  during my care of the patient were reviewed by me and considered in my medical decision making (see chart for details).    MDM Rules/Calculators/A&P                          LUQ and LLQ ttp, shingles rash but low grade fever and hx of diverticulosis - CT pending, fluids, labs, pt agreeable - she was also counciled at length regarding her contagiousness and tx course for this.  Conjunctivitis - R eye  I have personally viewed the CT scan of the abdomen and pelvis and agree with the radiologist that there is no signs of acute intra-abdominal or pelvic pathology to cause the patient's pain.  Laboratory work-up is reassuring with a normal CBC, normal lipase and metabolic panel.  The patient symptoms are likely coming from her herpetic rash consistent with a zoster infection.  Valtrex, drops for the eye, home  Final Clinical Impression(s) / ED Diagnoses Final diagnoses:  Herpes zoster without complication  Acute conjunctivitis of right eye, unspecified acute conjunctivitis type    Rx / DC Orders ED Discharge Orders         Ordered    valACYclovir (VALTREX) 1000 MG tablet  3 times daily        10/13/20 1957           Noemi Chapel, MD 10/13/20 2035

## 2020-10-13 NOTE — ED Notes (Signed)
Patient transported to CT 

## 2020-10-15 ENCOUNTER — Encounter (HOSPITAL_COMMUNITY): Payer: Self-pay

## 2020-10-15 ENCOUNTER — Emergency Department (HOSPITAL_COMMUNITY)
Admission: EM | Admit: 2020-10-15 | Discharge: 2020-10-15 | Disposition: A | Discharge status: Home / Self Care | Payer: Self-pay | Attending: Emergency Medicine | Admitting: Emergency Medicine

## 2020-10-15 ENCOUNTER — Other Ambulatory Visit: Payer: Self-pay

## 2020-10-15 DIAGNOSIS — Z794 Long term (current) use of insulin: Secondary | ICD-10-CM | POA: Insufficient documentation

## 2020-10-15 DIAGNOSIS — E119 Type 2 diabetes mellitus without complications: Secondary | ICD-10-CM | POA: Insufficient documentation

## 2020-10-15 DIAGNOSIS — Z7984 Long term (current) use of oral hypoglycemic drugs: Secondary | ICD-10-CM | POA: Insufficient documentation

## 2020-10-15 DIAGNOSIS — F17210 Nicotine dependence, cigarettes, uncomplicated: Secondary | ICD-10-CM | POA: Insufficient documentation

## 2020-10-15 DIAGNOSIS — B029 Zoster without complications: Secondary | ICD-10-CM | POA: Insufficient documentation

## 2020-10-15 DIAGNOSIS — Z79899 Other long term (current) drug therapy: Secondary | ICD-10-CM | POA: Insufficient documentation

## 2020-10-15 DIAGNOSIS — I10 Essential (primary) hypertension: Secondary | ICD-10-CM | POA: Insufficient documentation

## 2020-10-15 MED ORDER — OXYCODONE-ACETAMINOPHEN 5-325 MG PO TABS
1.0000 | ORAL_TABLET | Freq: Once | ORAL | Status: AC
  Administered 2020-10-15: 1 via ORAL
  Filled 2020-10-15: qty 1

## 2020-10-15 MED ORDER — OXYCODONE-ACETAMINOPHEN 5-325 MG PO TABS: 1.0000 | ORAL_TABLET | ORAL | 0 refills | Status: DC | PRN

## 2020-10-15 NOTE — ED Triage Notes (Signed)
Pt presents with complaints of pain to her left flank area. Patient was just seen here and diagnosed with diverticulitis and shingles. Reports she has had no relief in pain with treatment at home. Pain is directly over the rash and slightly above it. Denies any other symptoms.

## 2020-10-15 NOTE — ED Provider Notes (Signed)
[facility] EMERGENCY DEPARTMENT Provider Note   CSN: [id number] Arrival date & time: 10/15/20  1037     History Chief Complaint  Patient presents with  . Rash    [place] is a 60 y.o. female.  The history is provided by the patient. No language interpreter was used.  Rash Location:  Torso Torso rash location:  Lower back Quality: blistering, painful and redness   Pain details:    Quality:  Aching   Severity:  Severe   Timing:  Constant   Progression:  Worsening Severity:  Severe Timing:  Constant Progression:  Worsening Relieved by:  Nothing Worsened by:  Nothing  No relief from naprosyn.      Past Medical History:  Diagnosis Date  . Anxiety and depression   . Diabetes mellitus, type II ([place])    Dr. Dorris Fetch managing  . Frequent headaches chronic   ibuprofen helps some  . History of UTI    Recurrent per pt (approx 3 per year)  . Hyperlipidemia   . Hypertension   . Leukocytosis 12/2015   mild lymphocytosis.  Path smear review reassuring.  Repeat 01/2016 stable.  . Morbid obesity ([place])   . OSA on CPAP     Patient Active Problem List   Diagnosis Date Noted  . Uncontrolled type 2 diabetes mellitus with hyperglycemia ([place]) 06/21/2020  . Abnormal CT of the abdomen 06/24/2019  . Current smoker 10/07/2018  . Perianal abscess 10/06/2018  . Hypokalemia 10/06/2018  . Nausea without vomiting 07/30/2018  . Unintentional weight loss 07/30/2018  . Constipation 06/05/2018  . GERD (gastroesophageal reflux disease) 06/05/2018  . Abdominal pain 06/05/2018  . Uncontrolled type 2 diabetes mellitus with complication, without long-term current use of insulin ([place]) 08/22/2015  . Mixed hyperlipidemia 08/22/2015  . Essential hypertension, benign 08/22/2015  . Morbid obesity due to excess calories ([place]) 08/22/2015  . Personal history of noncompliance with medical treatment, presenting hazards to health 08/22/2015    Past Surgical History:  Procedure Laterality Date   . ABDOMINAL HYSTERECTOMY  1998   Dysfunctional uterine bleeding.  No hx of abnormal paps.  Marland Kitchen BIOPSY  10/20/2018   Procedure: BIOPSY;  Surgeon: Danie Binder, MD;  Location: [facility];  Service: Endoscopy;;  stomach  . COLONOSCOPY  2012   Dr. Earley Brooke: 1.5 cm pendunculated descending colon polyp (tubulovillous adenoma) and diminutive hyperplastic polyps  . COLONOSCOPY N/A 10/20/2018   Procedure: COLONOSCOPY;  Surgeon: Danie Binder, MD; 4 tubular adenomas, diverticulosis in the sigmoid colon, tortuous left colon, external and internal hemorrhoids. Due for repeat in 2023.   Marland Kitchen ESOPHAGOGASTRODUODENOSCOPY N/A 10/20/2018   Procedure: ESOPHAGOGASTRODUODENOSCOPY (EGD);  Surgeon: Danie Binder, MD;  gastritis and duodenitis. No H. Pylori.   Marland Kitchen POLYPECTOMY  10/20/2018   Procedure: POLYPECTOMY;  Surgeon: Danie Binder, MD;  Location: [facility];  Service: Endoscopy;;  . wart removal  08/04/2019   benign     OB History    Gravida  3   Para  1   Term  1   Preterm      AB  2   Living        SAB  2   IAB      Ectopic      Multiple      Live Births              Family History  Problem Relation Age of Onset  . Cancer Mother  ovarian  . COPD Father   . Diabetes Father   . Hyperlipidemia Father   . Heart attack Father   . Heart failure Father   . Colon cancer Neg Hx     Social History   Tobacco Use  . Smoking status: Current Every Day Smoker    Packs/day: 0.25    Years: 20.00    Pack years: 5.00    Types: Cigarettes  . Smokeless tobacco: Never Used  Vaping Use  . Vaping Use: Never used  Substance Use Topics  . Alcohol use: No  . Drug use: No    Home Medications Prior to Admission medications   Medication Sig Start Date End Date Taking? Authorizing Provider  oxyCODONE-acetaminophen (PERCOCET) 5-325 MG tablet Take 1 tablet by mouth every 4 (four) hours as needed for severe pain. 10/15/20 10/15/21 Yes Caryl Ada K, PA-C  atorvastatin (LIPITOR) 20 MG  tablet TAKE 1 Tablet BY MOUTH ONCE EVERY DAY 07/18/20   Soyla Dryer, PA-C  cloNIDine (CATAPRES) 0.1 MG tablet TAKE 1 Tablet  BY MOUTH TWICE DAILY 07/18/20   Soyla Dryer, PA-C  glipiZIDE (GLUCOTROL) 5 MG tablet Take 1 tablet (5 mg total) by mouth 2 (two) times daily before a meal. 07/26/20   Nida, Marella Chimes, MD  hydrALAZINE (APRESOLINE) 10 MG tablet Take 1 tablet (10 mg total) by mouth 3 (three) times daily. 09/13/20   Soyla Dryer, PA-C  hydrochlorothiazide (MICROZIDE) 12.5 MG capsule Take 1 capsule (12.5 mg total) by mouth daily. 09/13/20   Soyla Dryer, PA-C  insulin aspart (NOVOLOG) 100 UNIT/ML injection Inject 10-16 Units into the skin 3 (three) times daily before meals. 07/19/20   Cassandria Anger, MD  insulin glargine (LANTUS) 100 UNIT/ML injection Inject 80 Units into the skin at bedtime.    [provider]  losartan (COZAAR) 100 MG tablet TAKE 1 Tablet BY MOUTH ONCE EVERY DAY 07/18/20   Soyla Dryer, PA-C  lubiprostone (AMITIZA) 24 MCG capsule 1 PO QHS FOR 7 DAYS THEN 1 PO BID. TAKE WITH FOOD. Patient taking differently: Take 24 mcg by mouth as needed. 10/20/18   Danie Binder, MD  meloxicam (MOBIC) 15 MG tablet TAKE 1/2 Tablet BY MOUTH ONCE EVERY DAY 10/10/20   Carole Civil, MD  meloxicam (MOBIC) 7.5 MG tablet Take 1 tablet (7.5 mg total) by mouth daily. 09/05/20   Carole Civil, MD  metFORMIN (GLUCOPHAGE) 1000 MG tablet TAKE 1 Tablet  BY MOUTH TWICE DAILY WITH A MEAL 07/18/20   Soyla Dryer, PA-C  metoprolol tartrate (LOPRESSOR) 100 MG tablet TAKE 1 Tablet  BY MOUTH TWICE DAILY 07/18/20   Soyla Dryer, PA-C  naproxen (NAPROSYN) 500 MG tablet Take 1 tablet (500 mg total) by mouth 2 (two) times daily with a meal. 10/13/20   Noemi Chapel, MD  Omega-3 Fatty Acids (FISH OIL) 1000 MG CAPS Take 1,000 mg by mouth daily.    [provider]  pantoprazole (PROTONIX) 40 MG tablet Take 1 tablet (40 mg total) by mouth daily. 07/18/20   Erenest Rasher, PA-C  PROVENTIL HFA 108 (90 Base) MCG/ACT inhaler INHALE 2 PUFFS BY MOUTH EVERY 6 HOURS AS NEEDED FOR COUGHING, WHEEZING, OR SHORTNESS OF BREATH 07/18/20   Soyla Dryer, PA-C  valACYclovir (VALTREX) 1000 MG tablet Take 1 tablet (1,000 mg total) by mouth 3 (three) times daily. 10/13/20   Noemi Chapel, MD    Allergies    Januvia [sitagliptin]  Review of Systems   Review of Systems  Skin: Positive  for rash.  All other systems reviewed and are negative.   Physical Exam Updated Vital Signs BP (!) 190/84   Pulse 100   Temp 99.2 F (37.3 C)   SpO2 97%   Physical Exam  ED Results / Procedures / Treatments   Labs (all labs ordered are listed, but only abnormal results are displayed) Labs Reviewed - No data to display  EKG None  Radiology CT ABDOMEN PELVIS WO CONTRAST  Result Date: 10/13/2020 CLINICAL DATA:  60 year old female with concern for acute diverticulitis. EXAM: CT ABDOMEN AND PELVIS WITHOUT CONTRAST TECHNIQUE: Multidetector CT imaging of the abdomen and pelvis was performed following the standard protocol without IV contrast. COMPARISON:  CT abdomen pelvis dated 10/06/2018. CT abdomen pelvis dated 03/04/2018. FINDINGS: Evaluation of this exam is limited in the absence of intravenous contrast. Lower chest: Bibasilar linear atelectasis/scarring. The visualized lung bases are otherwise clear. No intra-abdominal free air or free fluid. Hepatobiliary: No focal liver abnormality is seen. No gallstones, gallbladder wall thickening, or biliary dilatation. Pancreas: Unremarkable. No pancreatic ductal dilatation or surrounding inflammatory changes. Spleen: Normal in size without focal abnormality. Adrenals/Urinary Tract: The adrenal glands unremarkable. Bilobed appearing right renal interpolar cyst similar to prior CT. There is a punctate nonobstructing right renal interpolar calculus. No hydronephrosis. The left kidney is unremarkable. The visualized ureters and urinary  bladder appear unremarkable. Stomach/Bowel: There is sigmoid diverticulosis without active inflammatory changes. There is no bowel obstruction or active inflammation. The appendix is normal. Vascular/Lymphatic: Minimal aortoiliac atherosclerotic disease. The IVC is unremarkable. No portal venous gas. There is no adenopathy. Reproductive: Hysterectomy. No adnexal masses. Other: A 2.3 cm ovoid lesion in the subcutaneous soft tissues of the mons pubis along the right groin which is not characterized but possibly a sebaceous cyst. Ultrasound may provide better evaluation. Musculoskeletal: Degenerative changes at L5-S1. No acute osseous pathology. IMPRESSION: 1. No acute intra-abdominal or pelvic pathology. 2. Sigmoid diverticulosis. No bowel obstruction. Normal appendix. 3. Punctate nonobstructing right renal interpolar calculus. No hydronephrosis. 4. Aortic Atherosclerosis (ICD10-I70.0). Electronically Signed   By: Anner Crete M.D.   On: 10/13/2020 20:27    Procedures Procedures   Medications Ordered in ED Medications  oxyCODONE-acetaminophen (PERCOCET/ROXICET) 5-325 MG per tablet 1 tablet (has no administration in time range)    ED Course  I have reviewed the triage vital signs and the nursing notes.  Pertinent labs & imaging results that were available during my care of the patient were reviewed by me and considered in my medical decision making (see chart for details).    MDM Rules/Calculators/A&P                          Shingles left flank.  Pt given percocet here.  Rx for percocet  Final Clinical Impression(s) / ED Diagnoses Final diagnoses:  Herpes zoster without complication    Rx / DC Orders ED Discharge Orders         Ordered    oxyCODONE-acetaminophen (PERCOCET) 5-325 MG tablet  Every 4 hours PRN        10/15/20 1132        An After Visit Summary was printed and given to the patient.   Fransico Meadow, [place] 10/15/20 1135    Margette Fast, MD 10/16/20 [phone]

## 2020-10-19 ENCOUNTER — Encounter: Payer: Self-pay | Admitting: Physician Assistant

## 2020-10-19 ENCOUNTER — Ambulatory Visit: Payer: HRSA Program | Admitting: Physician Assistant

## 2020-10-19 ENCOUNTER — Other Ambulatory Visit: Payer: Self-pay

## 2020-10-19 ENCOUNTER — Ambulatory Visit (INDEPENDENT_AMBULATORY_CARE_PROVIDER_SITE_OTHER): Payer: Self-pay | Admitting: "Endocrinology

## 2020-10-19 ENCOUNTER — Encounter: Payer: Self-pay | Admitting: "Endocrinology

## 2020-10-19 VITALS — BP 168/70 | HR 92 | Ht 64.0 in | Wt 205.2 lb

## 2020-10-19 VITALS — BP 154/75 | HR 85 | Temp 97.4°F

## 2020-10-19 DIAGNOSIS — E782 Mixed hyperlipidemia: Secondary | ICD-10-CM

## 2020-10-19 DIAGNOSIS — F17200 Nicotine dependence, unspecified, uncomplicated: Secondary | ICD-10-CM

## 2020-10-19 DIAGNOSIS — B029 Zoster without complications: Secondary | ICD-10-CM

## 2020-10-19 DIAGNOSIS — H1031 Unspecified acute conjunctivitis, right eye: Secondary | ICD-10-CM

## 2020-10-19 DIAGNOSIS — I10 Essential (primary) hypertension: Secondary | ICD-10-CM

## 2020-10-19 DIAGNOSIS — E1165 Type 2 diabetes mellitus with hyperglycemia: Secondary | ICD-10-CM

## 2020-10-19 LAB — POCT GLYCOSYLATED HEMOGLOBIN (HGB A1C): HbA1c, POC (controlled diabetic range): 7.9 % — AB (ref 0.0–7.0)

## 2020-10-19 LAB — HEMOGLOBIN A1C: Hemoglobin A1C: 7.9

## 2020-10-19 MED ORDER — ERYTHROMYCIN 5 MG/GM OP OINT: 1.0000 "application " | TOPICAL_OINTMENT | Freq: Three times a day (TID) | OPHTHALMIC | 0 refills | Status: DC

## 2020-10-19 NOTE — Progress Notes (Signed)
10/19/2020, 4:34 PM  Endocrinology follow-up note   Subjective:    Patient ID: Casey Johnston, female    DOB: 08-23-1960.  Casey Johnston is being seen in follow-up after she was seen consultation for management of currently uncontrolled symptomatic diabetes requested by  Soyla Dryer, PA-C.   Past Medical History:  Diagnosis Date  . Anxiety and depression   . Diabetes mellitus, type II ([place])    Dr. Dorris Fetch managing  . Frequent headaches chronic   ibuprofen helps some  . History of UTI    Recurrent per pt (approx 3 per year)  . Hyperlipidemia   . Hypertension   . Leukocytosis 12/2015   mild lymphocytosis.  Path smear review reassuring.  Repeat 01/2016 stable.  . Morbid obesity ([place])   . OSA on CPAP     Past Surgical History:  Procedure Laterality Date  . ABDOMINAL HYSTERECTOMY  1998   Dysfunctional uterine bleeding.  No hx of abnormal paps.  Marland Kitchen BIOPSY  10/20/2018   Procedure: BIOPSY;  Surgeon: Danie Binder, MD;  [facility]: [facility];  Service: Endoscopy;;  stomach  . COLONOSCOPY  2012   Dr. Earley Brooke: 1.5 cm pendunculated descending colon polyp (tubulovillous adenoma) and diminutive hyperplastic polyps  . COLONOSCOPY N/A 10/20/2018   Procedure: COLONOSCOPY;  Surgeon: Danie Binder, MD; 4 tubular adenomas, diverticulosis in the sigmoid colon, tortuous left colon, external and internal hemorrhoids. Due for repeat in 2023.   Marland Kitchen ESOPHAGOGASTRODUODENOSCOPY N/A 10/20/2018   Procedure: ESOPHAGOGASTRODUODENOSCOPY (EGD);  Surgeon: Danie Binder, MD;  gastritis and duodenitis. No H. Pylori.   Marland Kitchen POLYPECTOMY  10/20/2018   Procedure: POLYPECTOMY;  Surgeon: Danie Binder, MD;  Location: [facility];  Service: Endoscopy;;  . wart removal  08/04/2019   benign    Social History   Socioeconomic History  . Marital status: Widowed    Spouse name: Not on file  . Number of children: Not on file  .  Years of education: Not on file  . Highest education level: Not on file  Occupational History  . Not on file  Tobacco Use  . Smoking status: Current Every Day Smoker    Packs/day: 0.25    Years: 20.00    Pack years: 5.00    Types: Cigarettes  . Smokeless tobacco: Never Used  Vaping Use  . Vaping Use: Never used  Substance and Sexual Activity  . Alcohol use: No  . Drug use: No  . Sexual activity: Not on file  Other Topics Concern  . Not on file  Social History Narrative   Separated x 20 yrs, one daughter.  She lives with her.  Great niece and nephew live with her as well.   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   Tob: 7 pack year hx, quit 2010.   Alc: none   Exercise: none   Social Determinants of Health   Financial Resource Strain: Not on file  Food Insecurity: Not on file  Transportation Needs: Not on file  Physical Activity: Not on file  Stress: Not on file  Social Connections: Not on file    Family History  Problem Relation Age of Onset  . Cancer Mother        ovarian  . COPD Father   . Diabetes Father   . Hyperlipidemia Father   . Heart attack Father   . Heart failure Father   . Colon cancer Neg Hx     Outpatient Encounter Medications as of 10/19/2020  Medication Sig  . atorvastatin (LIPITOR) 20 MG tablet TAKE 1 Tablet BY MOUTH ONCE EVERY DAY  . cetirizine (ZYRTEC) 10 MG tablet Take 10 mg by mouth daily.  . cloNIDine (CATAPRES) 0.1 MG tablet TAKE 1 Tablet  BY MOUTH TWICE DAILY  . erythromycin ophthalmic ointment Place 1 application into the right eye 3 (three) times daily. For 5 days  . glipiZIDE (GLUCOTROL) 5 MG tablet Take 1 tablet (5 mg total) by mouth 2 (two) times daily before a meal.  . hydrALAZINE (APRESOLINE) 10 MG tablet Take 1 tablet (10 mg total) by mouth 3 (three) times daily.  . hydrochlorothiazide (MICROZIDE) 12.5 MG capsule Take 1 capsule (12.5 mg total) by mouth daily.  . insulin aspart (NOVOLOG) 100 UNIT/ML injection Inject 10-16 Units into  the skin 3 (three) times daily before meals.  . insulin glargine (LANTUS) 100 UNIT/ML injection Inject 80 Units into the skin at bedtime.  Marland Kitchen losartan (COZAAR) 100 MG tablet TAKE 1 Tablet BY MOUTH ONCE EVERY DAY  . lubiprostone (AMITIZA) 24 MCG capsule 1 PO QHS FOR 7 DAYS THEN 1 PO BID. TAKE WITH FOOD. (Patient not taking: Reported on 10/19/2020)  . meloxicam (MOBIC) 15 MG tablet TAKE 1/2 Tablet BY MOUTH ONCE EVERY DAY  . meloxicam (MOBIC) 7.5 MG tablet Take 1 tablet (7.5 mg total) by mouth daily.  . metFORMIN (GLUCOPHAGE) 1000 MG tablet TAKE 1 Tablet  BY MOUTH TWICE DAILY WITH A MEAL  . metoprolol tartrate (LOPRESSOR) 100 MG tablet TAKE 1 Tablet  BY MOUTH TWICE DAILY  . naproxen (NAPROSYN) 500 MG tablet Take 1 tablet (500 mg total) by mouth 2 (two) times daily with a meal.  . Omega-3 Fatty Acids (FISH OIL) 1000 MG CAPS Take 1,000 mg by mouth daily.  Marland Kitchen oxyCODONE-acetaminophen (PERCOCET) 5-325 MG tablet Take 1 tablet by mouth every 4 (four) hours as needed for severe pain.  . pantoprazole (PROTONIX) 40 MG tablet Take 1 tablet (40 mg total) by mouth daily.  Marland Kitchen PROVENTIL HFA 108 (90 Base) MCG/ACT inhaler INHALE 2 PUFFS BY MOUTH EVERY 6 HOURS AS NEEDED FOR COUGHING, WHEEZING, OR SHORTNESS OF BREATH  . trimethoprim-polymyxin b (POLYTRIM) ophthalmic solution every 4 (four) hours.  . valACYclovir (VALTREX) 1000 MG tablet Take 1 tablet (1,000 mg total) by mouth 3 (three) times daily.   No facility-administered encounter medications on file as of 10/19/2020.    ALLERGIES: Allergies  Allergen Reactions  . Amlodipine Swelling  . Januvia [Sitagliptin] Other (See Comments)    Caused pancreatitis    VACCINATION STATUS: Immunization History  Administered Date(s) Administered  . Tdap 02/17/2016    Diabetes She presents for her follow-up diabetic visit. She has type 2 diabetes mellitus. Onset time: She was diagnosed at approximate age of 58 years.  She was previously seen in this clinic prior to 2018. Her  disease course has been improving. There are no hypoglycemic associated symptoms. Pertinent negatives for hypoglycemia include no confusion, headaches, pallor or seizures. Associated symptoms include fatigue, polydipsia and polyuria. Pertinent negatives for diabetes include no chest pain and no polyphagia. There are no hypoglycemic complications. Symptoms are improving. (Noncompliance.) Risk factors for coronary  artery disease include diabetes mellitus, dyslipidemia, hypertension, tobacco exposure, post-menopausal, sedentary lifestyle, family history and obesity. Current diabetic treatment includes insulin injections. Her weight is decreasing steadily. She is following a generally unhealthy diet. When asked about meal planning, she reported none. She has not had a previous visit with a dietitian. She never participates in exercise. Her home blood glucose trend is increasing steadily. Her breakfast blood glucose range is generally 140-180 mg/dl. Her lunch blood glucose range is generally 180-200 mg/dl. Her dinner blood glucose range is generally 180-200 mg/dl. Her bedtime blood glucose range is generally 180-200 mg/dl. Her overall blood glucose range is 180-200 mg/dl. (She presents with near target fasting glycemic profile, above target postprandial readings.  Her point-of-care A1c is 7.9%, improving from 11% during her last visit.  She did not document hypoglycemia.  She did not utilize her NovoLog properly.   ) An ACE inhibitor/angiotensin II receptor blocker is being taken. Eye exam is current.  Hyperlipidemia This is a chronic problem. The current episode started more than 1 year ago. The problem is uncontrolled. Exacerbating diseases include diabetes and obesity. Pertinent negatives include no chest pain, myalgias or shortness of breath. Current antihyperlipidemic treatment includes statins. Risk factors for coronary artery disease include diabetes mellitus, dyslipidemia, hypertension, a sedentary lifestyle,  post-menopausal, obesity and family history.  Hypertension This is a chronic problem. The current episode started more than 1 year ago. The problem is controlled. Pertinent negatives include no chest pain, headaches, palpitations or shortness of breath. Risk factors for coronary artery disease include diabetes mellitus, dyslipidemia, family history, obesity, post-menopausal state, sedentary lifestyle and smoking/tobacco exposure. Past treatments include angiotensin blockers.     Review of Systems  Constitutional: Positive for fatigue. Negative for chills, fever and unexpected weight change.  HENT: Negative for trouble swallowing and voice change.   Eyes: Negative for visual disturbance.  Respiratory: Negative for cough, shortness of breath and wheezing.   Cardiovascular: Negative for chest pain, palpitations and leg swelling.  Gastrointestinal: Negative for diarrhea, nausea and vomiting.  Endocrine: Positive for polydipsia and polyuria. Negative for cold intolerance, heat intolerance and polyphagia.  Musculoskeletal: Negative for arthralgias and myalgias.  Skin: Negative for color change, pallor, rash and wound.  Neurological: Negative for seizures and headaches.  Psychiatric/Behavioral: Negative for confusion and suicidal ideas.    Objective:    Vitals with BMI 10/19/2020 10/19/2020 10/15/2020  Height 5\' 4"  - -  Weight 205 lbs 3 oz - -  BMI 44.31 - -  Systolic 540 086 761  Diastolic 70 75 84  Pulse 92 85 100    BP (!) 168/70   Pulse 92   Ht 5\' 4"  (1.626 m)   Wt 205 lb 3.2 oz (93.1 kg)   BMI 35.22 kg/m   Wt Readings from Last 3 Encounters:  10/19/20 205 lb 3.2 oz (93.1 kg)  10/13/20 200 lb (90.7 kg)  07/19/20 215 lb 3.2 oz (97.6 kg)     Physical Exam Constitutional:      Appearance: She is well-developed.  HENT:     Head: Normocephalic and atraumatic.  Neck:     Thyroid: No thyromegaly.     Trachea: No tracheal deviation.  Cardiovascular:     Rate and Rhythm: Normal  rate and regular rhythm.  Pulmonary:     Effort: Pulmonary effort is normal.  Abdominal:     Tenderness: There is no abdominal tenderness. There is no guarding.  Musculoskeletal:        General: Normal range  of motion.     Cervical back: Normal range of motion and neck supple.  Skin:    General: Skin is warm and dry.     Coloration: Skin is not pale.     Findings: No erythema or rash.  Neurological:     Mental Status: She is alert and oriented to person, place, and time.     Cranial Nerves: No cranial nerve deficit.     Coordination: Coordination normal.     Deep Tendon Reflexes: Reflexes are normal and symmetric.  Psychiatric:     Comments: Reluctant affect.       CMP ( most recent) CMP     Component Value Date/Time   NA 135 10/13/2020 1936   K 3.5 10/13/2020 1936   CL 101 10/13/2020 1936   CO2 26 10/13/2020 1936   GLUCOSE 105 (H) 10/13/2020 1936   BUN 10 10/13/2020 1936   CREATININE 0.73 10/13/2020 1936   CREATININE 0.66 08/27/2016 1521   CALCIUM 9.0 10/13/2020 1936   PROT 8.5 (H) 10/13/2020 1936   ALBUMIN 3.9 10/13/2020 1936   AST 26 10/13/2020 1936   ALT 22 10/13/2020 1936   ALKPHOS 74 10/13/2020 1936   BILITOT 0.5 10/13/2020 1936   GFRNONAA >60 10/13/2020 1936   GFRAA >60 02/09/2020 1054     Diabetic Labs (most recent): Lab Results  Component Value Date   HGBA1C 7.9 (A) 10/19/2020   HGBA1C 11.0 (A) 07/19/2020   HGBA1C 10.8 (H) 04/29/2020     Lipid Panel ( most recent) Lipid Panel     Component Value Date/Time   CHOL 155 09/07/2020 1550   TRIG 173 (H) 09/07/2020 1550   HDL 41 09/07/2020 1550   CHOLHDL 3.8 09/07/2020 1550   VLDL 35 09/07/2020 1550   LDLCALC 79 09/07/2020 1550   LDLDIRECT 119.0 01/18/2016 1038      Lab Results  Component Value Date   TSH 3.547 10/11/2020   TSH 1.23 01/18/2016   FREET4 0.69 10/11/2020      Assessment & Plan:   1. Uncontrolled type 2 diabetes mellitus with hyperglycemia ([place])   - [place]  has currently uncontrolled symptomatic type 2 DM since  60 years of age,  with most recent A1c of 10.8 %. Recent labs reviewed.  She presents with near target fasting glycemic profile, above target postprandial readings.  Her point-of-care A1c is 7.9%, improving from 11% during her last visit.  She did not document hypoglycemia.  She did not utilize her NovoLog properly.    - I had a long discussion with her about the progressive nature of diabetes and the pathology behind its complications. -her diabetes is complicated by noncompliance/nonadherence, chronic smoking, obesity/sedentary life and she remains at a high risk for more acute and chronic complications which include CAD, CVA, CKD, retinopathy, and neuropathy. These are all discussed in detail with her.  - I have counseled her on diet  and weight management  by adopting a carbohydrate restricted/protein rich diet. Patient is encouraged to switch to  unprocessed or minimally processed     complex starch and increased protein intake (animal or plant source), fruits, and vegetables. -  she is advised to stick to a routine mealtimes to eat 3 meals  a day and avoid unnecessary snacks ( to snack only to correct hypoglycemia).   - she acknowledges that there is a room for improvement in her food and drink choices. - Suggestion is made for her to avoid simple carbohydrates  from  her diet including Cakes, Sweet Desserts, Ice Cream, Soda (diet and regular), Sweet Tea, Candies, Chips, Cookies, Store Bought Juices, Alcohol in Excess of  1-2 drinks a day, Artificial Sweeteners,  Coffee Creamer, and "Sugar-free" Products, Lemonade. This will help patient to have more stable blood glucose profile and potentially avoid unintended weight gain.   - she will be scheduled with Jearld Fenton, RDN, CDE for diabetes education.  - I have approached her with the following individualized plan to manage  her diabetes and patient agrees:   -In light of her chronic  severe hyperglycemia, she will continue to need intensive treatment with basal/bolus insulin in order for her to achieve and maintain control of diabetes to target.   -I discussed insulin injection and utility once again for her not to avoid NovoLog for readings between 90-150 mg/day.   -Accordingly, she is advised to continue Lantus 80 units nightly, continue NovoLog  10-16  units 3 times a day with meals  for pre-meal BG readings of 90-150mg /dl, plus patient specific correction dose for unexpected hyperglycemia above 150mg /dl, associated with strict monitoring of glucose 4 times a day-before meals and at bedtime. - she is warned not to take insulin without proper monitoring per orders. - Adjustment parameters are given to her for hypo and hyperglycemia in writing. - she is encouraged to call clinic for blood glucose levels less than 70 or above 300 mg /dl. - she is advised to continue Metformin 1000 mg p.o. twice daily, therapeutically suitable for patient . - She is benefiting from low-dose glipizide, advised to continue glipizide 5 mg XL p.o. daily at breakfast.   - she is high risk for pancreatitis due to chronic heavy smoking, will avoid incretin therapy for now.   - Specific targets for  A1c;  LDL, HDL,  and Triglycerides were discussed with the patient.  2) Blood Pressure /Hypertension: Her blood pressure is not controlled to target.  Largely related to her chronic heavy smoking.  she is advised to continue her current medications including amlodipine 10 mg p.o. daily, clonidine 0.1 mg p.o. twice daily, losartan 100 mg p.o. daily, metoprolol 100 mg p.o. twice daily.   The patient was counseled on the dangers of tobacco use, and was advised to quit.  Reviewed strategies to maximize success, including removing cigarettes and smoking materials from environment.   3) Lipids/Hyperlipidemia:   Review of her recent lipid panel showed uncontrolled  LDL at 73 .  she  is advised to continue  atorvastatin 20 mg p.o. daily at bedtime.  Side effects and precautions discussed with her.    4)  Weight/Diet:  Body mass index is 35.22 kg/m.  -   clearly complicating her diabetes care.   she is  a candidate for weight loss. I discussed with her the fact that loss of 5 - 10% of her  current body weight will have the most impact on her diabetes management.  Exercise, and detailed carbohydrates information provided  -  detailed on discharge instructions.  5) Chronic Care/Health Maintenance:  -she  is on ACEI/ARB and Statin medications and  is encouraged to initiate and continue to follow up with Ophthalmology, Dentist,  Podiatrist at least yearly or according to recommendations, and advised to  quit smoking. I have recommended yearly flu vaccine and pneumonia vaccine at least every 5 years; moderate intensity exercise for up to 150 minutes weekly; and  sleep for at least 7 hours a day.   Her recent point-of-care screening ABI was  negative for PAD.  This study will be repeated in March 2027, or sooner if needed.  - she is  advised to maintain close follow up with Soyla Dryer, PA-C for primary care needs, as well as her other providers for optimal and coordinated care.  I spent 40 minutes in the care of the patient today including review of labs from [place], Lipids, Thyroid Function, Hematology (current and previous including abstractions from other facilities); face-to-face time discussing  her blood glucose readings/logs, discussing hypoglycemia and hyperglycemia episodes and symptoms, medications doses, her options of short and long term treatment based on the latest standards of care / guidelines;  discussion about incorporating lifestyle medicine;  and documenting the encounter.    Please refer to Patient Instructions for Blood Glucose Monitoring and Insulin/Medications Dosing Guide"  in media tab for additional information. Please  also refer to " Patient Self Inventory" in the Media  tab for  reviewed elements of pertinent patient history.  [place] participated in the discussions, expressed understanding, and voiced agreement with the above plans.  All questions were answered to her satisfaction. she is encouraged to contact clinic should she have any questions or concerns prior to her return visit.    Follow up plan: - Return in about 3 months (around 01/19/2021) for Bring Meter and Logs- A1c in Office.  Glade Lloyd, MD [facility] [address] [place], [place] [address] Phone: [phone]  Fax: [phone]    10/19/2020, 4:34 PM  This note was partially dictated with voice recognition software. Similar sounding words can be transcribed inadequately or may not  be corrected upon review.

## 2020-10-19 NOTE — Progress Notes (Signed)
BP (!) 154/75   Pulse 85   Temp (!) 97.4 F (36.3 C)   SpO2 96%    Subjective:    Patient ID: Casey Johnston, female    DOB: Oct 14, 1960, 60 y.o.   MRN: [id number]  HPI: Casey Johnston is a 60 y.o. female presenting on 10/19/2020 for Hypertension   HPI  Pt had a negative covid 19 screening questionnaire.    Pt is 17yoF who presents for follow up HTN.  Pt has been seen in the ER twice over the past week.  She was diagnosed with conjunctivitis and given rx for polytrim.  She was diagnosed with shingles and given rx for acyclovir.    Pt reports her Shingles is a lot improved.  She has been using her Eye drops since 10/13/20.  she feels like her eye has improved "a little bit"   She says everything is improving now but she is really tired.  She says she now knows what fatigue is.    Pain now 5/10.    Pt was on amlodipine in past- stopped due to swelling      Relevant past medical, surgical, family and social history reviewed and updated as indicated. Interim medical history since our last visit reviewed. Allergies and medications reviewed and updated.   Current Outpatient Medications:  .  atorvastatin (LIPITOR) 20 MG tablet, TAKE 1 Tablet BY MOUTH ONCE EVERY DAY, Disp: 90 tablet, Rfl: 1 .  cetirizine (ZYRTEC) 10 MG tablet, Take 10 mg by mouth daily., Disp: , Rfl:  .  cloNIDine (CATAPRES) 0.1 MG tablet, TAKE 1 Tablet  BY MOUTH TWICE DAILY, Disp: 180 tablet, Rfl: 1 .  glipiZIDE (GLUCOTROL) 5 MG tablet, Take 1 tablet (5 mg total) by mouth 2 (two) times daily before a meal., Disp: 180 tablet, Rfl: 3 .  hydrALAZINE (APRESOLINE) 10 MG tablet, Take 1 tablet (10 mg total) by mouth 3 (three) times daily., Disp: 270 tablet, Rfl: 0 .  hydrochlorothiazide (MICROZIDE) 12.5 MG capsule, Take 1 capsule (12.5 mg total) by mouth daily., Disp: 90 capsule, Rfl: 0 .  insulin aspart (NOVOLOG) 100 UNIT/ML injection, Inject 10-16 Units into the skin 3 (three) times daily before meals., Disp:  15 mL, Rfl: 2 .  insulin glargine (LANTUS) 100 UNIT/ML injection, Inject 80 Units into the skin at bedtime., Disp: , Rfl:  .  losartan (COZAAR) 100 MG tablet, TAKE 1 Tablet BY MOUTH ONCE EVERY DAY, Disp: 90 tablet, Rfl: 1 .  meloxicam (MOBIC) 7.5 MG tablet, Take 1 tablet (7.5 mg total) by mouth daily., Disp: 30 tablet, Rfl: 5 .  metFORMIN (GLUCOPHAGE) 1000 MG tablet, TAKE 1 Tablet  BY MOUTH TWICE DAILY WITH A MEAL, Disp: 180 tablet, Rfl: 1 .  metoprolol tartrate (LOPRESSOR) 100 MG tablet, TAKE 1 Tablet  BY MOUTH TWICE DAILY, Disp: 180 tablet, Rfl: 1 .  naproxen (NAPROSYN) 500 MG tablet, Take 1 tablet (500 mg total) by mouth 2 (two) times daily with a meal., Disp: 30 tablet, Rfl: 0 .  Omega-3 Fatty Acids (FISH OIL) 1000 MG CAPS, Take 1,000 mg by mouth daily., Disp: , Rfl:  .  oxyCODONE-acetaminophen (PERCOCET) 5-325 MG tablet, Take 1 tablet by mouth every 4 (four) hours as needed for severe pain., Disp: 20 tablet, Rfl: 0 .  pantoprazole (PROTONIX) 40 MG tablet, Take 1 tablet (40 mg total) by mouth daily., Disp: 30 tablet, Rfl: 11 .  PROVENTIL HFA 108 (90 Base) MCG/ACT inhaler, INHALE 2 PUFFS BY MOUTH EVERY 6 HOURS  AS NEEDED FOR COUGHING, WHEEZING, OR SHORTNESS OF BREATH, Disp: 20.1 g, Rfl: 1 .  trimethoprim-polymyxin b (POLYTRIM) ophthalmic solution, every 4 (four) hours., Disp: , Rfl:  .  valACYclovir (VALTREX) 1000 MG tablet, Take 1 tablet (1,000 mg total) by mouth 3 (three) times daily., Disp: 21 tablet, Rfl: 0 .  lubiprostone (AMITIZA) 24 MCG capsule, 1 PO QHS FOR 7 DAYS THEN 1 PO BID. TAKE WITH FOOD. (Patient not taking: Reported on 10/19/2020), Disp: 62 capsule, Rfl: 11 .  meloxicam (MOBIC) 15 MG tablet, TAKE 1/2 Tablet BY MOUTH ONCE EVERY DAY, Disp: 15 tablet, Rfl: 5    Review of Systems  Per HPI unless specifically indicated above     Objective:    BP (!) 154/75   Pulse 85   Temp (!) 97.4 F (36.3 C)   SpO2 96%   Wt Readings from Last 3 Encounters:  10/13/20 200 lb (90.7 kg)   07/19/20 215 lb 3.2 oz (97.6 kg)  07/14/20 215 lb (97.5 kg)    Physical Exam Vitals reviewed.  Constitutional:      General: She is not in acute distress.    Appearance: She is well-developed. She is not toxic-appearing.     Comments: Pt looks tired compared with her usual appearance.  HENT:     Head: Normocephalic and atraumatic.  Eyes:     General:        Right eye: Discharge present.        Left eye: No discharge.     Extraocular Movements: Extraocular movements intact.     Pupils: Pupils are equal.     Right eye: No fluorescein uptake.     Comments: OD discharge.  Lids puffy right eye.  Sore along the rim of the upper right eyelid.  Proparacaine 1 gtt OD. instilled fluorescein. Exam with woods lamp reveals no uptake.    Cardiovascular:     Rate and Rhythm: Normal rate and regular rhythm.  Pulmonary:     Effort: Pulmonary effort is normal.     Breath sounds: Normal breath sounds.  Abdominal:     General: Bowel sounds are normal.     Palpations: Abdomen is soft. There is no mass.     Tenderness: There is no abdominal tenderness.  Musculoskeletal:     Cervical back: Neck supple.     Right lower leg: No edema.     Left lower leg: No edema.  Lymphadenopathy:     Cervical: No cervical adenopathy.  Skin:    General: Skin is warm and dry.     Findings: Rash present.          Comments: Rash right thoracic back with extension around to the lateral.  Rash appears to be drying vesicles.  Band of rash is approximately 6 inches wide.   No rash elsewhere on the body seen.   Neurological:     Mental Status: She is alert and oriented to person, place, and time.  Psychiatric:        Behavior: Behavior normal.       D/c OD             Assessment & Plan:    Encounter Diagnoses  Name Primary?  . Essential hypertension, benign Yes  . Acute conjunctivitis of right eye, unspecified acute conjunctivitis type   . Herpes zoster without complication       -due to the  extent of pt's continuing infection in the right eye on polytrim, this rx will be  discontinued and she is given rx for erythromycin ointment. -she is to complete her course of acyclovir -due to her current level of shingles pain, will not adjust her htn meds.  She will RTO 1 month to get this rechecked and have medications adjusted at that time. -Pt is given note for work to be out this week -Pt will follow up in 1 month to recheck HTN.  She is to contact office sooner prn

## 2020-10-19 NOTE — Patient Instructions (Signed)
                                     Advice for Weight Management  -For most of us the best way to lose weight is by diet management. Generally speaking, diet management means consuming less calories intentionally which over time brings about progressive weight loss.  This can be achieved more effectively by restricting carbohydrate consumption to the minimum possible.  So, it is critically important to know your numbers: how much calorie you are consuming and how much calorie you need. More importantly, our carbohydrates sources should be unprocessed or minimally processed complex starch food items.   Sometimes, it is important to balance nutrition by increasing protein intake (animal or plant source), fruits, and vegetables.  -Sticking to a routine mealtime to eat 3 meals a day and avoiding unnecessary snacks is shown to have a big role in weight control. Under normal circumstances, the only time we lose real weight is when we are hungry, so allow hunger to take place- hunger means no food between meal times, only water.  It is not advisable to starve.   -It is better to avoid simple carbohydrates including: Cakes, Sweet Desserts, Ice Cream, Soda (diet and regular), Sweet Tea, Candies, Chips, Cookies, Store Bought Juices, Alcohol in Excess of  1-2 drinks a day, Lemonade,  Artificial Sweeteners, Doughnuts, Coffee Creamers, "Sugar-free" Products, etc, etc.  This is not a complete list.....    -Consulting with certified diabetes educators is proven to provide you with the most accurate and current information on diet.  Also, you may be  interested in discussing diet options/exchanges , we can schedule a visit with Casey Johnston, RDN, CDE for individualized nutrition education.  -Exercise: If you are able: 30 -60 minutes a day ,4 days a week, or 150 minutes a week.  The longer the better.  Combine stretch, strength, and aerobic activities.  If you were told in the  past that you have high risk for cardiovascular diseases, you may seek evaluation by your heart doctor prior to initiating moderate to intense exercise programs.                                  Additional Care Considerations for Diabetes   -Diabetes  is a chronic disease.  The most important care consideration is regular follow-up with your diabetes care provider with the goal being avoiding or delaying its complications and to take advantage of advances in medications and technology.    -Type 2 diabetes is known to coexist with other important comorbidities such as high blood pressure and high cholesterol.  It is critical to control not only the diabetes but also the high blood pressure and high cholesterol to minimize and delay the risk of complications including coronary artery disease, stroke, amputations, blindness, etc.    - Studies showed that people with diabetes will benefit from a class of medications known as ACE inhibitors and statins.  Unless there are specific reasons not to be on these medications, the standard of care is to consider getting one from these groups of medications at an optimal doses.  These medications are generally considered safe and proven to help protect the heart and the kidneys.    - People with diabetes are encouraged to initiate and maintain regular follow-up with eye doctors, foot   doctors, dentists , and if necessary heart and kidney doctors.     - It is highly recommended that people with diabetes quit smoking or stay away from smoking, and get yearly  flu vaccine and pneumonia vaccine at least every 5 years.  One other important lifestyle recommendation is to ensure adequate sleep - at least 6-7 hours of uninterrupted sleep at night.  -Exercise: If you are able: 30 -60 minutes a day, 4 days a week, or 150 minutes a week.  The longer the better.  Combine stretch, strength, and aerobic activities.  If you were told in the past that you have high risk for  cardiovascular diseases, you may seek evaluation by your heart doctor prior to initiating moderate to intense exercise programs.          

## 2020-10-20 ENCOUNTER — Ambulatory Visit: Payer: HRSA Program | Admitting: Physician Assistant

## 2020-10-20 ENCOUNTER — Ambulatory Visit: Payer: Self-pay | Admitting: "Endocrinology

## 2020-11-22 ENCOUNTER — Telehealth: Payer: Self-pay | Admitting: Physician Assistant

## 2020-11-22 ENCOUNTER — Ambulatory Visit: Payer: Self-pay | Admitting: *Deleted

## 2020-11-22 NOTE — Telephone Encounter (Signed)
Pt called to rescheduled appointment for tomorrow due to she has cold symptoms.  She declined appointment for her cold symptoms.  She has not gotten covid test.  She is encouraged to get covid test and isolate until she gets results.  She states understanding.  Her appt for htn was rescheduled.

## 2020-11-22 NOTE — Telephone Encounter (Signed)
Reason for Disposition  Pain persisting > 1 month after rash disappears    Pt goes to the [facility] [place] sees Soyla Dryer, PA-C. [facility] does not schedule for them.   I encouraged her to contact them for an appt for the continued pain.  Answer Assessment - Initial Assessment Questions 1. APPEARANCE of RASH: "Describe the rash."      I'm still in a lot of pain.   It started on left back and onto front.  It's numb. 2. LOCATION: "Where is the rash located?"      Left back 3. ONSET: "When did the rash start?"      May 26th 4. ITCHING: "Does the rash itch?" If Yes, ask: "How bad is the itch?"  (Scale 1-10; or mild, moderate, severe)     Rash is gone but still having a lot of pain where the Shingles rash was. 5. PAIN: "Does the rash hurt?" If Yes, ask: "How bad is the pain?"  (Scale 0-10; or none, mild, moderate, severe)    - NONE (0): no pain    - MILD (1-3): doesn't interfere with normal activities     - MODERATE (4-7): interferes with normal activities or awakens from sleep     - SEVERE (8-10): excruciating pain, unable to do any normal activities     Moderate.   I've used all the medication they gave me in the emergency room.   It was diagnosed May 26th when I went to the ED. 6. OTHER SYMPTOMS: "Do you have any other symptoms?" (e.g., fever)     Not much appetite but I'm not worried about that as much as I am about the continued pain. 7. PREGNANCY: "Is there any chance you are pregnant?" "When was your last menstrual period?"     N/A due to age  Protocols used: Shingles (Zoster)-A-AH

## 2020-11-22 NOTE — Telephone Encounter (Signed)
Pt called in on the community line.    I returned her call.   She is c/o continued pain where she had a Shingles rash around her left back coming around to her side.   The rash is gone but still having the pain. Pt goes to the [facility] [place]. And sees Soyla Dryer, [place].    I instructed her to contact them and make an appt to be seen for the continued pain.   I explained about the post herpetic pain that can occur after having shingles and that there are medications that can be prescribed to help with the nerve pain.  She was agreeable to this suggestion and thanked me for calling her back.

## 2020-11-23 ENCOUNTER — Ambulatory Visit: Payer: HRSA Program | Admitting: Physician Assistant

## 2020-11-23 ENCOUNTER — Telehealth: Payer: Self-pay | Admitting: Physician Assistant

## 2020-11-23 MED ORDER — GABAPENTIN 100 MG PO CAPS: 100.0000 mg | ORAL_CAPSULE | Freq: Three times a day (TID) | ORAL | 1 refills | Status: DC | PRN

## 2020-11-23 NOTE — Telephone Encounter (Signed)
Pt called and left VM requesting pain meds for her shingles.   She was seen and treated with antivirals for the shingles end of May.  Rx for gabapentin sent to [facility]

## 2020-12-13 ENCOUNTER — Encounter: Payer: Self-pay | Admitting: Physician Assistant

## 2020-12-13 ENCOUNTER — Ambulatory Visit: Payer: HRSA Program | Admitting: Physician Assistant

## 2020-12-13 VITALS — BP 138/72 | HR 71 | Temp 98.2°F | Wt 204.0 lb

## 2020-12-13 DIAGNOSIS — I10 Essential (primary) hypertension: Secondary | ICD-10-CM

## 2020-12-13 NOTE — Patient Instructions (Signed)
HYDRALAZINE is 3 times/day  Do not take meloxicam and naproxen together (they are both NSAIDS)

## 2020-12-13 NOTE — Progress Notes (Signed)
BP 140/63   Pulse 71   Temp 98.2 F (36.8 C)   Wt 204 lb (92.5 kg)   SpO2 96%   BMI 35.02 kg/m    Subjective:    Patient ID: Casey Johnston, female    DOB: 10-12-1960, 60 y.o.   MRN: [id number]  HPI: Casey Johnston is a 60 y.o. female presenting on 12/13/2020 for Hypertension   HPI  Pt had a negative covid 19 screening questionnaire.  Chief Complaint  Patient presents with   Hypertension     Pt says her shingles is resolved.  She is feeling much better now.    She is still working thru [employer] in [place]  She has no complaints today.  While reviewing her medications, it is discovered that she is only taking her hydralazine bid.      Relevant past medical, surgical, family and social history reviewed and updated as indicated. Interim medical history since our last visit reviewed. Allergies and medications reviewed and updated.   Current Outpatient Medications:    atorvastatin (LIPITOR) 20 MG tablet, TAKE 1 Tablet BY MOUTH ONCE EVERY DAY, Disp: 90 tablet, Rfl: 1   cetirizine (ZYRTEC) 10 MG tablet, Take 10 mg by mouth daily., Disp: , Rfl:    cloNIDine (CATAPRES) 0.1 MG tablet, TAKE 1 Tablet  BY MOUTH TWICE DAILY, Disp: 180 tablet, Rfl: 1   gabapentin (NEURONTIN) 100 MG capsule, Take 1 capsule (100 mg total) by mouth every 8 (eight) hours as needed., Disp: 90 capsule, Rfl: 1   glipiZIDE (GLUCOTROL) 5 MG tablet, Take 1 tablet (5 mg total) by mouth 2 (two) times daily before a meal., Disp: 180 tablet, Rfl: 3   hydrALAZINE (APRESOLINE) 10 MG tablet, Take 1 tablet (10 mg total) by mouth 3 (three) times daily., Disp: 270 tablet, Rfl: 0   hydrochlorothiazide (MICROZIDE) 12.5 MG capsule, Take 1 capsule (12.5 mg total) by mouth daily., Disp: 90 capsule, Rfl: 0   insulin aspart (NOVOLOG) 100 UNIT/ML injection, Inject 10-16 Units into the skin 3 (three) times daily before meals., Disp: 15 mL, Rfl: 2   insulin glargine (LANTUS) 100 UNIT/ML injection, Inject 80  Units into the skin at bedtime., Disp: , Rfl:    losartan (COZAAR) 100 MG tablet, TAKE 1 Tablet BY MOUTH ONCE EVERY DAY, Disp: 90 tablet, Rfl: 1   lubiprostone (AMITIZA) 24 MCG capsule, 1 PO QHS FOR 7 DAYS THEN 1 PO BID. TAKE WITH FOOD., Disp: 62 capsule, Rfl: 11   meloxicam (MOBIC) 15 MG tablet, TAKE 1/2 Tablet BY MOUTH ONCE EVERY DAY, Disp: 15 tablet, Rfl: 5   meloxicam (MOBIC) 7.5 MG tablet, Take 1 tablet (7.5 mg total) by mouth daily., Disp: 30 tablet, Rfl: 5   metFORMIN (GLUCOPHAGE) 1000 MG tablet, TAKE 1 Tablet  BY MOUTH TWICE DAILY WITH A MEAL, Disp: 180 tablet, Rfl: 1   metoprolol tartrate (LOPRESSOR) 100 MG tablet, TAKE 1 Tablet  BY MOUTH TWICE DAILY, Disp: 180 tablet, Rfl: 1   naproxen (NAPROSYN) 500 MG tablet, Take 1 tablet (500 mg total) by mouth 2 (two) times daily with a meal., Disp: 30 tablet, Rfl: 0   Omega-3 Fatty Acids (FISH OIL) 1000 MG CAPS, Take 1,000 mg by mouth daily., Disp: , Rfl:    pantoprazole (PROTONIX) 40 MG tablet, Take 1 tablet (40 mg total) by mouth daily., Disp: 30 tablet, Rfl: 11   PROVENTIL HFA 108 (90 Base) MCG/ACT inhaler, INHALE 2 PUFFS BY MOUTH EVERY 6 HOURS AS NEEDED FOR  COUGHING, WHEEZING, OR SHORTNESS OF BREATH, Disp: 20.1 g, Rfl: 1   Review of Systems  Per HPI unless specifically indicated above     Objective:    BP 140/63   Pulse 71   Temp 98.2 F (36.8 C)   Wt 204 lb (92.5 kg)   SpO2 96%   BMI 35.02 kg/m   Wt Readings from Last 3 Encounters:  12/13/20 204 lb (92.5 kg)  10/19/20 205 lb 3.2 oz (93.1 kg)  10/13/20 200 lb (90.7 kg)    Physical Exam Vitals reviewed.  Constitutional:      General: She is not in acute distress.    Appearance: She is well-developed. She is not ill-appearing.  HENT:     Head: Normocephalic and atraumatic.  Cardiovascular:     Rate and Rhythm: Normal rate and regular rhythm.  Pulmonary:     Effort: Pulmonary effort is normal.     Breath sounds: Normal breath sounds.  Abdominal:     General: Bowel sounds  are normal.     Palpations: Abdomen is soft. There is no mass.     Tenderness: There is no abdominal tenderness.  Musculoskeletal:     Cervical back: Neck supple.     Right lower leg: No edema.     Left lower leg: No edema.  Lymphadenopathy:     Cervical: No cervical adenopathy.  Skin:    General: Skin is warm and dry.  Neurological:     Mental Status: She is alert and oriented to person, place, and time.  Psychiatric:        Behavior: Behavior normal.          Assessment & Plan:    Encounter Diagnoses  Name Primary?   Essential hypertension, benign Yes   Hypertension not at goal      -pt is counseled to take No naproxen when taking mobic -she is encouraged to Take the hydralazine tid as prescribed  Current HTN meds: Hydralazine Hctz Clonidine Losartan Metoprolol  In light of the many meds she is taking for her bp and still having elevated bp, will get Renal US  -pt is educated and encouraged to get covid vaccination  Pt is to follow up 2 months.  She is to contact office sooner prn

## 2020-12-15 ENCOUNTER — Other Ambulatory Visit: Payer: Self-pay | Admitting: Physician Assistant

## 2020-12-28 ENCOUNTER — Ambulatory Visit (HOSPITAL_COMMUNITY): Payer: Self-pay | Attending: Physician Assistant

## 2021-01-04 ENCOUNTER — Other Ambulatory Visit: Payer: Self-pay

## 2021-01-04 ENCOUNTER — Ambulatory Visit (HOSPITAL_COMMUNITY)
Admission: RE | Admit: 2021-01-04 | Discharge: 2021-01-04 | Disposition: A | Discharge status: Home / Self Care | Payer: Self-pay | Source: Ambulatory Visit | Attending: Physician Assistant | Admitting: Physician Assistant

## 2021-01-04 DIAGNOSIS — I10 Essential (primary) hypertension: Secondary | ICD-10-CM | POA: Insufficient documentation

## 2021-01-19 ENCOUNTER — Encounter: Payer: Self-pay | Admitting: "Endocrinology

## 2021-01-19 ENCOUNTER — Ambulatory Visit (INDEPENDENT_AMBULATORY_CARE_PROVIDER_SITE_OTHER): Payer: Self-pay | Admitting: "Endocrinology

## 2021-01-19 VITALS — BP 105/66 | HR 72 | Ht 64.0 in | Wt 203.0 lb

## 2021-01-19 DIAGNOSIS — I10 Essential (primary) hypertension: Secondary | ICD-10-CM

## 2021-01-19 DIAGNOSIS — E782 Mixed hyperlipidemia: Secondary | ICD-10-CM

## 2021-01-19 DIAGNOSIS — E1165 Type 2 diabetes mellitus with hyperglycemia: Secondary | ICD-10-CM

## 2021-01-19 LAB — HEMOGLOBIN A1C: Hemoglobin A1C: 7.2

## 2021-01-19 NOTE — Patient Instructions (Signed)

## 2021-01-19 NOTE — Progress Notes (Signed)
01/20/2021, 9:33 AM  Endocrinology follow-up note   Subjective:    Patient ID: Casey Johnston, female    DOB: [birth year].  Casey Johnston is being seen in follow-up after she was seen in consultation for management of currently uncontrolled type 2 diabetes, hyperlipidemia, hypertension. PCP: Soyla Dryer, PA-C.   Past Medical History:  Diagnosis Date   Anxiety and depression    Diabetes mellitus, type II ([place])    Dr. Dorris Fetch managing   Frequent headaches chronic   ibuprofen helps some   History of UTI    Recurrent per pt (approx 3 per year)   Hyperlipidemia    Hypertension    Leukocytosis 12/2015   mild lymphocytosis.  Path smear review reassuring.  Repeat 01/2016 stable.   Morbid obesity ([place])    OSA on CPAP     Past Surgical History:  Procedure Laterality Date   ABDOMINAL HYSTERECTOMY  1998   Dysfunctional uterine bleeding.  No hx of abnormal paps.   BIOPSY  10/20/2018   Procedure: BIOPSY;  Surgeon: Danie Binder, MD;  [facility]: [facility];  Service: Endoscopy;;  stomach   COLONOSCOPY  2012   Dr. Earley Brooke: 1.5 cm pendunculated descending colon polyp (tubulovillous adenoma) and diminutive hyperplastic polyps   COLONOSCOPY N/A 10/20/2018   Procedure: COLONOSCOPY;  Surgeon: Danie Binder, MD; 4 tubular adenomas, diverticulosis in the sigmoid colon, tortuous left colon, external and internal hemorrhoids. Due for repeat in 2023.    ESOPHAGOGASTRODUODENOSCOPY N/A 10/20/2018   Procedure: ESOPHAGOGASTRODUODENOSCOPY (EGD);  Surgeon: Danie Binder, MD;  gastritis and duodenitis. No H. Pylori.    POLYPECTOMY  10/20/2018   Procedure: POLYPECTOMY;  Surgeon: Danie Binder, MD;  Location[facility];  Service: Endoscopy;;   wart removal  08/04/2019   benign    Social History   Socioeconomic History   Marital status: Divorced    Spouse name: Not on file   Number of children: Not on file    Years of education: Not on file   Highest education level: Not on file  Occupational History   Not on file  Tobacco Use   Smoking status: Every Day    Packs/day: 0.25    Years: 20.00    Pack years: 5.00    Types: Cigarettes   Smokeless tobacco: Never  Vaping Use   Vaping Use: Never used  Substance and Sexual Activity   Alcohol use: No   Drug use: No   Sexual activity: Not on file  Other Topics Concern   Not on file  Social History Narrative   Separated x 20 yrs, one daughter.  She lives with her.  Great niece and nephew live with her as well.   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   Tob: 7 pack year hx, quit 2010.   Alc: none   Exercise: none   Social Determinants of Health   Financial Resource Strain: Not on file  Food Insecurity: Not on file  Transportation Needs: Not on file  Physical Activity: Not on file  Stress: Not on file  Social Connections: Not on file    Family History  Problem Relation Age of Onset   Cancer Mother        ovarian   COPD Father    Diabetes Father    Hyperlipidemia Father    Heart attack Father    Heart failure Father    Colon cancer Neg Hx     Outpatient Encounter Medications as of 01/19/2021  Medication Sig   atorvastatin (LIPITOR) 20 MG tablet TAKE 1 Tablet BY MOUTH ONCE EVERY DAY   cetirizine (ZYRTEC) 10 MG tablet Take 10 mg by mouth daily.   cloNIDine (CATAPRES) 0.1 MG tablet TAKE 1 Tablet  BY MOUTH TWICE DAILY   glipiZIDE (GLUCOTROL) 5 MG tablet Take 1 tablet (5 mg total) by mouth 2 (two) times daily before a meal.   hydrALAZINE (APRESOLINE) 10 MG tablet TAKE 1 Tablet BY MOUTH 3 TIMES DAILY   hydrochlorothiazide (HYDRODIURIL) 25 MG tablet TAKE 1/2 Tablet BY MOUTH ONCE DAILY   Insulin Aspart (NOVOLOG FLEXPEN [place]) Inject 6-12 Units into the skin 3 (three) times daily before meals.   insulin glargine (LANTUS) 100 UNIT/ML injection Inject 80 Units into the skin at bedtime.   losartan (COZAAR) 100 MG tablet TAKE 1 Tablet BY MOUTH  ONCE EVERY DAY   lubiprostone (AMITIZA) 24 MCG capsule 1 PO QHS FOR 7 DAYS THEN 1 PO BID. TAKE WITH FOOD.   meloxicam (MOBIC) 15 MG tablet TAKE 1/2 Tablet BY MOUTH ONCE EVERY DAY   meloxicam (MOBIC) 7.5 MG tablet Take 1 tablet (7.5 mg total) by mouth daily.   metFORMIN (GLUCOPHAGE) 1000 MG tablet TAKE 1 Tablet  BY MOUTH TWICE DAILY WITH A MEAL   metoprolol tartrate (LOPRESSOR) 100 MG tablet TAKE 1 Tablet  BY MOUTH TWICE DAILY   naproxen (NAPROSYN) 500 MG tablet Take 1 tablet (500 mg total) by mouth 2 (two) times daily with a meal.   Omega-3 Fatty Acids (FISH OIL) 1000 MG CAPS Take 1,000 mg by mouth daily.   pantoprazole (PROTONIX) 40 MG tablet Take 1 tablet (40 mg total) by mouth daily.   PROVENTIL HFA 108 (90 Base) MCG/ACT inhaler INHALE 2 PUFFS BY MOUTH EVERY 6 HOURS AS NEEDED FOR COUGHING, WHEEZING, OR SHORTNESS OF BREATH   [DISCONTINUED] insulin aspart (NOVOLOG) 100 UNIT/ML injection Inject 10-16 Units into the skin 3 (three) times daily before meals.   gabapentin (NEURONTIN) 100 MG capsule Take 1 capsule (100 mg total) by mouth every 8 (eight) hours as needed. (Patient not taking: Reported on 01/19/2021)   No facility-administered encounter medications on file as of 01/19/2021.    ALLERGIES: Allergies  Allergen Reactions   Amlodipine Swelling   Januvia [Sitagliptin] Other (See Comments)    Caused pancreatitis    VACCINATION STATUS: Immunization History  Administered Date(s) Administered   Tdap 02/17/2016    Diabetes She presents for her follow-up diabetic visit. She has type 2 diabetes mellitus. Onset time: She was diagnosed at approximate age of 88 years.  She was previously seen in this clinic prior to 2018. Her disease course has been improving. There are no hypoglycemic associated symptoms. Pertinent negatives for hypoglycemia include no confusion, headaches, pallor or seizures. Associated symptoms include fatigue, polydipsia and polyuria. Pertinent negatives for diabetes include  no chest pain and no polyphagia. There are no hypoglycemic complications. Symptoms are improving. (Noncompliance.) Risk factors for coronary artery disease include diabetes mellitus, dyslipidemia, hypertension, tobacco exposure, post-menopausal, sedentary lifestyle, family history and obesity. Current diabetic treatment includes insulin injections. Her weight is fluctuating minimally. She is following a generally unhealthy diet. When  asked about meal planning, she reported none. She has not had a previous visit with a dietitian. She never participates in exercise. Her home blood glucose trend is decreasing steadily. Her breakfast blood glucose range is generally 140-180 mg/dl. Her lunch blood glucose range is generally 140-180 mg/dl. Her dinner blood glucose range is generally 140-180 mg/dl. Her bedtime blood glucose range is generally 140-180 mg/dl. Her overall blood glucose range is 140-180 mg/dl. (She presents with continued improvement in her glycemic profile.  Her point-of-care A1c 7.2% progressively improving from 11%.  She did not document any hypoglycemia.    ) An ACE inhibitor/angiotensin II receptor blocker is being taken. Eye exam is current.  Hyperlipidemia This is a chronic problem. The current episode started more than 1 year ago. The problem is uncontrolled. Exacerbating diseases include diabetes and obesity. Pertinent negatives include no chest pain, myalgias or shortness of breath. Current antihyperlipidemic treatment includes statins. Risk factors for coronary artery disease include diabetes mellitus, dyslipidemia, hypertension, a sedentary lifestyle, post-menopausal, obesity and family history.  Hypertension This is a chronic problem. The current episode started more than 1 year ago. The problem is controlled. Pertinent negatives include no chest pain, headaches, palpitations or shortness of breath. Risk factors for coronary artery disease include diabetes mellitus, dyslipidemia, family  history, obesity, post-menopausal state, sedentary lifestyle and smoking/tobacco exposure. Past treatments include angiotensin blockers.    Review of Systems  Constitutional:  Positive for fatigue. Negative for chills, fever and unexpected weight change.  HENT:  Negative for trouble swallowing and voice change.   Eyes:  Negative for visual disturbance.  Respiratory:  Negative for cough, shortness of breath and wheezing.   Cardiovascular:  Negative for chest pain, palpitations and leg swelling.  Gastrointestinal:  Negative for diarrhea, nausea and vomiting.  Endocrine: Positive for polydipsia and polyuria. Negative for cold intolerance, heat intolerance and polyphagia.  Musculoskeletal:  Negative for arthralgias and myalgias.  Skin:  Negative for color change, pallor, rash and wound.  Neurological:  Negative for seizures and headaches.  Psychiatric/Behavioral:  Negative for confusion and suicidal ideas.    Objective:    Vitals with BMI 01/19/2021 12/13/2020 12/13/2020  Height '5\' 4"'$  - -  Weight 203 lbs - 204 lbs  BMI 123456 - -  Systolic 123456 [id number]  Diastolic 66 72 63  Pulse 72 - 71    BP 105/66   Pulse 72   Ht '5\' 4"'$  (1.626 m)   Wt 203 lb (92.1 kg)   BMI 34.84 kg/m   Wt Readings from Last 3 Encounters:  01/19/21 203 lb (92.1 kg)  12/13/20 204 lb (92.5 kg)  10/19/20 205 lb 3.2 oz (93.1 kg)     Physical Exam Constitutional:      Appearance: She is well-developed.  HENT:     Head: Normocephalic and atraumatic.  Neck:     Thyroid: No thyromegaly.     Trachea: No tracheal deviation.  Cardiovascular:     Rate and Rhythm: Normal rate and regular rhythm.  Pulmonary:     Effort: Pulmonary effort is normal.  Abdominal:     Tenderness: There is no abdominal tenderness. There is no guarding.  Musculoskeletal:        General: Normal range of motion.     Cervical back: Normal range of motion and neck supple.  Skin:    General: Skin is warm and dry.     Coloration: Skin is not  pale.     Findings: No erythema or rash.  Neurological:     Mental Status: She is alert and oriented to person, place, and time.     Cranial Nerves: No cranial nerve deficit.     Coordination: Coordination normal.     Deep Tendon Reflexes: Reflexes are normal and symmetric.  Psychiatric:     Comments: Reluctant affect.      CMP ( most recent) CMP     Component Value Date/Time   NA 135 10/13/2020 1936   K 3.5 10/13/2020 1936   CL 101 10/13/2020 1936   CO2 26 10/13/2020 1936   GLUCOSE 105 (H) 10/13/2020 1936   BUN 10 10/13/2020 1936   CREATININE 0.73 10/13/2020 1936   CREATININE 0.66 08/27/2016 1521   CALCIUM 9.0 10/13/2020 1936   PROT 8.5 (H) 10/13/2020 1936   ALBUMIN 3.9 10/13/2020 1936   AST 26 10/13/2020 1936   ALT 22 10/13/2020 1936   ALKPHOS 74 10/13/2020 1936   BILITOT 0.5 10/13/2020 1936   GFRNONAA >60 10/13/2020 1936   GFRAA >60 02/09/2020 1054     Diabetic Labs (most recent): Lab Results  Component Value Date   HGBA1C 7.2 01/19/2021   HGBA1C 7.9 (A) 10/19/2020   HGBA1C 7.9 10/19/2020     Lipid Panel ( most recent) Lipid Panel     Component Value Date/Time   CHOL 155 09/07/2020 1550   TRIG 173 (H) 09/07/2020 1550   HDL 41 09/07/2020 1550   CHOLHDL 3.8 09/07/2020 1550   VLDL 35 09/07/2020 1550   LDLCALC 79 09/07/2020 1550   LDLDIRECT 119.0 01/18/2016 1038      Lab Results  Component Value Date   TSH 3.547 10/11/2020   TSH 1.23 01/18/2016   FREET4 0.69 10/11/2020      Assessment & Plan:   1. Uncontrolled type 2 diabetes mellitus with hyperglycemia ([place])   - [place] has currently uncontrolled symptomatic type 2 DM since  60 years of age,  with most recent A1c of 10.8 %. Recent labs reviewed.  She presents with continued improvement in her glycemic profile.  Her point-of-care A1c 7.2% progressively improving from 11%.  She did not document any hypoglycemia.    - I had a long discussion with her about the progressive nature of  diabetes and the pathology behind its complications. -her diabetes is complicated by noncompliance/nonadherence, chronic smoking, obesity/sedentary life and she remains at a high risk for more acute and chronic complications which include CAD, CVA, CKD, retinopathy, and neuropathy. These are all discussed in detail with her.  - I have counseled her on diet  and weight management  by adopting a carbohydrate restricted/protein rich diet. Patient is encouraged to switch to  unprocessed or minimally processed     complex starch and increased protein intake (animal or plant source), fruits, and vegetables. -  she is advised to stick to a routine mealtimes to eat 3 meals  a day and avoid unnecessary snacks ( to snack only to correct hypoglycemia).   - she acknowledges that there is a room for improvement in her food and drink choices. - Suggestion is made for her to avoid simple carbohydrates  from her diet including Cakes, Sweet Desserts, Ice Cream, Soda (diet and regular), Sweet Tea, Candies, Chips, Cookies, Store Bought Juices, Alcohol in Excess of  1-2 drinks a day, Artificial Sweeteners,  Coffee Creamer, and "Sugar-free" Products, Lemonade. This will help patient to have more stable blood glucose profile and potentially avoid unintended weight gain.   - she will be scheduled  with Jearld Fenton, RDN, CDE for diabetes education.  - I have approached her with the following individualized plan to manage  her diabetes and patient agrees:   -In light of her presentation with near target glycemic profile, she will be continued on same intensive basal/bolus insulin treatment regimen .  -Accordingly, she is advised to continue Lantus 80 units nightly, increase NovoLog to 6-12  units 3 times a day with meals  for pre-meal BG readings of 90-'150mg'$ /dl, plus patient specific correction dose for unexpected hyperglycemia above '150mg'$ /dl, associated with strict monitoring of glucose 4 times a day-before meals and at  bedtime. - she is warned not to take insulin without proper monitoring per orders. - Adjustment parameters are given to her for hypo and hyperglycemia in writing. - she is encouraged to call clinic for blood glucose levels less than 70 or above 300 mg /dl. - she is advised to continue metformin 1000 mg p.o. twice daily, and glipizide 5 mg XL p.o. daily at breakfast.      - she is high risk for pancreatitis due to chronic heavy smoking, will avoid incretin therapy for now.   - Specific targets for  A1c;  LDL, HDL,  and Triglycerides were discussed with the patient.  2) Blood Pressure /Hypertension:   Her blood pressure is controlled to target.  Largely related to her chronic heavy smoking.  she is advised to continue her current medications including amlodipine 10 mg p.o. daily, clonidine 0.1 mg p.o. twice daily, The patient was counseled on the dangers of tobacco use, and was advised to quit.  Reviewed strategies to maximize success, including removing cigarettes and smoking materials from environment.   3) Lipids/Hyperlipidemia:   Review of her recent lipid panel showed uncontrolled  LDL at 73 .  she  is advised to continue atorvastatin 20 mg p.o. daily at bedtime.  Side effects and precautions discussed with her.    4)  Weight/Diet:  Body mass index is 34.84 kg/m.  -   clearly complicating her diabetes care.   she is  a candidate for weight loss. I discussed with her the fact that loss of 5 - 10% of her  current body weight will have the most impact on her diabetes management.  Exercise, and detailed carbohydrates information provided  -  detailed on discharge instructions.  5) Chronic Care/Health Maintenance:  -she  is on ACEI/ARB and Statin medications and  is encouraged to initiate and continue to follow up with Ophthalmology, Dentist,  Podiatrist at least yearly or according to recommendations, and advised to  quit smoking. I have recommended yearly flu vaccine and pneumonia vaccine at  least every 5 years; moderate intensity exercise for up to 150 minutes weekly; and  sleep for at least 7 hours a day.   Her recent point-of-care screening ABI was negative for PAD.  This study will be repeated in March 2027, or sooner if needed.  - she is  advised to maintain close follow up with Soyla Dryer, PA-C for primary care needs, as well as her other providers for optimal and coordinated care.   I spent 31 minutes in the care of the patient today including review of labs from [place], Lipids, Thyroid Function, Hematology (current and previous including abstractions from other facilities); face-to-face time discussing  her blood glucose readings/logs, discussing hypoglycemia and hyperglycemia episodes and symptoms, medications doses, her options of short and long term treatment based on the latest standards of care / guidelines;  discussion about incorporating  lifestyle medicine;  and documenting the encounter.    Please refer to Patient Instructions for Blood Glucose Monitoring and Insulin/Medications Dosing Guide"  in media tab for additional information. Please  also refer to " Patient Self Inventory" in the Media  tab for reviewed elements of pertinent patient history.  [place] participated in the discussions, expressed understanding, and voiced agreement with the above plans.  All questions were answered to her satisfaction. she is encouraged to contact clinic should she have any questions or concerns prior to her return visit.     Follow up plan: - Return in about 4 months (around 05/21/2021) for F/U with Pre-visit Labs, Meter, Logs, A1c here.Glade Lloyd, MD [facility] [address] [place], [place] [address] Phone: [phone]  Fax: [phone]    01/20/2021, 9:33 AM  This note was partially dictated with voice recognition software. Similar sounding words can be transcribed inadequately or may not  be  corrected upon review.

## 2021-01-24 ENCOUNTER — Other Ambulatory Visit: Payer: Self-pay | Admitting: Physician Assistant

## 2021-02-01 ENCOUNTER — Other Ambulatory Visit: Payer: Self-pay | Admitting: Physician Assistant

## 2021-02-01 DIAGNOSIS — E1165 Type 2 diabetes mellitus with hyperglycemia: Secondary | ICD-10-CM

## 2021-02-01 DIAGNOSIS — E785 Hyperlipidemia, unspecified: Secondary | ICD-10-CM

## 2021-02-01 DIAGNOSIS — I10 Essential (primary) hypertension: Secondary | ICD-10-CM

## 2021-02-06 ENCOUNTER — Other Ambulatory Visit: Payer: Self-pay | Admitting: Physician Assistant

## 2021-02-06 ENCOUNTER — Other Ambulatory Visit (HOSPITAL_COMMUNITY): Payer: Self-pay | Admitting: Physician Assistant

## 2021-02-06 DIAGNOSIS — N281 Cyst of kidney, acquired: Secondary | ICD-10-CM

## 2021-02-09 ENCOUNTER — Telehealth: Payer: Self-pay

## 2021-02-09 ENCOUNTER — Encounter: Payer: Self-pay | Admitting: Gastroenterology

## 2021-02-09 NOTE — Telephone Encounter (Signed)
Called pt to inform of insulin delivery, left vm to call back.

## 2021-02-13 ENCOUNTER — Ambulatory Visit: Payer: HRSA Program | Admitting: Physician Assistant

## 2021-02-14 ENCOUNTER — Other Ambulatory Visit: Payer: Self-pay | Admitting: Physician Assistant

## 2021-02-14 DIAGNOSIS — R93429 Abnormal radiologic findings on diagnostic imaging of unspecified kidney: Secondary | ICD-10-CM

## 2021-02-14 DIAGNOSIS — N281 Cyst of kidney, acquired: Secondary | ICD-10-CM

## 2021-02-15 ENCOUNTER — Ambulatory Visit (HOSPITAL_COMMUNITY): Payer: Self-pay

## 2021-02-21 ENCOUNTER — Other Ambulatory Visit: Payer: Self-pay

## 2021-02-21 ENCOUNTER — Ambulatory Visit (HOSPITAL_COMMUNITY)
Admission: RE | Admit: 2021-02-21 | Discharge: 2021-02-21 | Disposition: A | Discharge status: Home / Self Care | Payer: Self-pay | Source: Ambulatory Visit | Attending: Physician Assistant | Admitting: Physician Assistant

## 2021-02-21 ENCOUNTER — Ambulatory Visit: Payer: HRSA Program | Admitting: Physician Assistant

## 2021-02-21 ENCOUNTER — Encounter: Payer: Self-pay | Admitting: Physician Assistant

## 2021-02-21 VITALS — BP 180/70 | HR 90 | Temp 98.0°F | Wt 204.0 lb

## 2021-02-21 DIAGNOSIS — E669 Obesity, unspecified: Secondary | ICD-10-CM

## 2021-02-21 DIAGNOSIS — E785 Hyperlipidemia, unspecified: Secondary | ICD-10-CM

## 2021-02-21 DIAGNOSIS — N281 Cyst of kidney, acquired: Secondary | ICD-10-CM

## 2021-02-21 DIAGNOSIS — B0229 Other postherpetic nervous system involvement: Secondary | ICD-10-CM

## 2021-02-21 DIAGNOSIS — I10 Essential (primary) hypertension: Secondary | ICD-10-CM

## 2021-02-21 DIAGNOSIS — F17200 Nicotine dependence, unspecified, uncomplicated: Secondary | ICD-10-CM

## 2021-02-21 DIAGNOSIS — E1165 Type 2 diabetes mellitus with hyperglycemia: Secondary | ICD-10-CM

## 2021-02-21 DIAGNOSIS — R011 Cardiac murmur, unspecified: Secondary | ICD-10-CM

## 2021-02-21 MED ORDER — GADOBUTROL 1 MMOL/ML IV SOLN
10.0000 mL | Freq: Once | INTRAVENOUS | Status: AC | PRN
  Administered 2021-02-21: 10 mL via INTRAVENOUS

## 2021-02-21 NOTE — Progress Notes (Signed)
BP (!) 180/70   Pulse 90   Temp 98 F (36.7 C)   Wt 204 lb (92.5 kg)   SpO2 96%   BMI 35.02 kg/m    Subjective:    Patient ID: Casey Johnston, female    DOB: 19-Sep-1960, 60 y.o.   MRN: [id number]  HPI: Casey Johnston is a 60 y.o. female presenting on 02/21/2021 for Hyperlipidemia   HPI  Pt had a negative covid 19 screening questionnaire.   Chief Complaint  Patient presents with   Hyperlipidemia   Hypertension      She is working at [employer] (thru [employer]) now. She gets to sit more than at her previous job which helps.  She didn't take her meds this morning due to she had MRI today and she thought she wasn't supposed to.   She checks bp at home- runs 120 or 130s.  She is seeing endocrinology for her DM  She is still having some pain and numbness from her shingles.  BP good last month at endocrinology appointment  Pt didn't get her labs drawn yet.    Relevant past medical, surgical, family and social history reviewed and updated as indicated. Interim medical history since our last visit reviewed. Allergies and medications reviewed and updated.   Current Outpatient Medications:    atorvastatin (LIPITOR) 20 MG tablet, TAKE 1 Tablet BY MOUTH ONCE EVERY DAY, Disp: 90 tablet, Rfl: 1   cetirizine (ZYRTEC) 10 MG tablet, Take 10 mg by mouth daily., Disp: , Rfl:    cloNIDine (CATAPRES) 0.1 MG tablet, TAKE 1 Tablet  BY MOUTH TWICE DAILY, Disp: 180 tablet, Rfl: 1   glipiZIDE (GLUCOTROL) 5 MG tablet, Take 1 tablet (5 mg total) by mouth 2 (two) times daily before a meal., Disp: 180 tablet, Rfl: 3   hydrALAZINE (APRESOLINE) 10 MG tablet, TAKE 1 Tablet BY MOUTH 3 TIMES DAILY, Disp: 270 tablet, Rfl: 0   hydrochlorothiazide (HYDRODIURIL) 25 MG tablet, TAKE 1/2 Tablet BY MOUTH ONCE DAILY, Disp: 45 tablet, Rfl: 0   Insulin Aspart (NOVOLOG FLEXPEN [place]), Inject 6-12 Units into the skin 3 (three) times daily before meals., Disp: , Rfl:    insulin glargine (LANTUS) 100  UNIT/ML injection, Inject 80 Units into the skin at bedtime., Disp: , Rfl:    losartan (COZAAR) 100 MG tablet, TAKE 1 Tablet BY MOUTH ONCE EVERY DAY, Disp: 90 tablet, Rfl: 1   lubiprostone (AMITIZA) 24 MCG capsule, 1 PO QHS FOR 7 DAYS THEN 1 PO BID. TAKE WITH FOOD., Disp: 62 capsule, Rfl: 11   meloxicam (MOBIC) 15 MG tablet, TAKE 1/2 Tablet BY MOUTH ONCE EVERY DAY, Disp: 15 tablet, Rfl: 5   meloxicam (MOBIC) 7.5 MG tablet, Take 1 tablet (7.5 mg total) by mouth daily., Disp: 30 tablet, Rfl: 5   metFORMIN (GLUCOPHAGE) 1000 MG tablet, TAKE 1 Tablet  BY MOUTH TWICE DAILY WITH A MEAL, Disp: 180 tablet, Rfl: 1   metoprolol tartrate (LOPRESSOR) 100 MG tablet, TAKE 1 Tablet  BY MOUTH TWICE DAILY, Disp: 180 tablet, Rfl: 1   naproxen (NAPROSYN) 500 MG tablet, Take 1 tablet (500 mg total) by mouth 2 (two) times daily with a meal., Disp: 30 tablet, Rfl: 0   Omega-3 Fatty Acids (FISH OIL) 1000 MG CAPS, Take 1,000 mg by mouth daily., Disp: , Rfl:    pantoprazole (PROTONIX) 40 MG tablet, Take 1 tablet (40 mg total) by mouth daily., Disp: 30 tablet, Rfl: 11   gabapentin (NEURONTIN) 100 MG capsule, Take 1  capsule (100 mg total) by mouth every 8 (eight) hours as needed. (Patient not taking: No sig reported), Disp: 90 capsule, Rfl: 1   PROVENTIL HFA 108 (90 Base) MCG/ACT inhaler, INHALE 2 PUFFS BY MOUTH EVERY 6 HOURS AS NEEDED FOR COUGHING, WHEEZING, OR SHORTNESS OF BREATH (Patient not taking: Reported on 02/21/2021), Disp: 20.1 g, Rfl: 1    Review of Systems  Per HPI unless specifically indicated above     Objective:    BP (!) 180/70   Pulse 90   Temp 98 F (36.7 C)   Wt 204 lb (92.5 kg)   SpO2 96%   BMI 35.02 kg/m   Wt Readings from Last 3 Encounters:  02/21/21 204 lb (92.5 kg)  01/19/21 203 lb (92.1 kg)  12/13/20 204 lb (92.5 kg)    Physical Exam Vitals reviewed.  Constitutional:      General: She is not in acute distress.    Appearance: She is well-developed. She is not toxic-appearing.   HENT:     Head: Normocephalic and atraumatic.  Cardiovascular:     Rate and Rhythm: Normal rate and regular rhythm.     Heart sounds: Murmur heard.  Pulmonary:     Effort: Pulmonary effort is normal.     Breath sounds: Normal breath sounds.  Abdominal:     General: Bowel sounds are normal.     Palpations: Abdomen is soft. There is no mass.     Tenderness: There is no abdominal tenderness.  Musculoskeletal:     Cervical back: Neck supple.     Right lower leg: No edema.     Left lower leg: No edema.  Lymphadenopathy:     Cervical: No cervical adenopathy.  Skin:    General: Skin is warm and dry.  Neurological:     Mental Status: She is alert and oriented to person, place, and time.  Psychiatric:        Behavior: Behavior normal.          Assessment & Plan:    Encounter Diagnoses  Name Primary?   Essential hypertension, benign Yes   Hyperlipidemia, unspecified hyperlipidemia type    Uncontrolled type 2 diabetes mellitus with hyperglycemia (H[facility])    Renal cyst    Tobacco use disorder    Obesity, unspecified classification, unspecified obesity type, unspecified whether serious comorbidity present    Post herpetic neuralgia    Heart murmur        -Pt to stay on current meds.  She is encouraged to continue checking her bp at home and is to call if > 140.  Discussed with pt that she can take her meds when she is told to be NPO for a test. -pt to Get blood labs done.  She will be called with results -Refer for echo to evaluate heart murmur in pt with uncontrolled DM and HTN who is a smoker.  She says she has cafa -pt to Continue with dr nida for DM -pt was educated and encouraged to get Covid vaccination -discussed mammograms and Pt wants to wait until after holidays for next mammogram -results of MRI done this morning to evaluate her renal cyst are not yet available.  She will be called with results -offered refill gabapentin refill for post herpetic neuralgia but pt  declined.  She is to contact office if she changes her mind -pt to follow up 3 months.  She is to contact office sooner prn

## 2021-02-22 ENCOUNTER — Ambulatory Visit: Payer: Self-pay | Admitting: Gastroenterology

## 2021-02-22 ENCOUNTER — Telehealth: Payer: Self-pay

## 2021-02-22 NOTE — Telephone Encounter (Signed)
Called to make ECHO appt with Forestine Na, left message for call back as no one was available.

## 2021-02-23 ENCOUNTER — Telehealth: Payer: Self-pay

## 2021-02-23 NOTE — Telephone Encounter (Signed)
Echo scheduled 03/31/2021 at 1pm, called to inform pt of appointment time, left vm to call back.

## 2021-02-23 NOTE — Telephone Encounter (Signed)
Spoke with pt & appointment time/date given.

## 2021-03-08 LAB — HM DIABETES EYE EXAM

## 2021-03-29 ENCOUNTER — Other Ambulatory Visit: Payer: Self-pay | Admitting: Physician Assistant

## 2021-03-29 ENCOUNTER — Other Ambulatory Visit: Payer: Self-pay | Admitting: Orthopedic Surgery

## 2021-03-29 DIAGNOSIS — G8929 Other chronic pain: Secondary | ICD-10-CM

## 2021-03-29 DIAGNOSIS — M7581 Other shoulder lesions, right shoulder: Secondary | ICD-10-CM

## 2021-03-29 DIAGNOSIS — M7582 Other shoulder lesions, left shoulder: Secondary | ICD-10-CM

## 2021-03-31 ENCOUNTER — Ambulatory Visit (HOSPITAL_COMMUNITY): Payer: Self-pay

## 2021-05-16 ENCOUNTER — Telehealth: Payer: Self-pay

## 2021-05-16 NOTE — Telephone Encounter (Signed)
Called pt to inform she needs to sign medication renewal. Left vm to call back

## 2021-05-29 ENCOUNTER — Other Ambulatory Visit (HOSPITAL_COMMUNITY)
Admission: RE | Admit: 2021-05-29 | Discharge: 2021-05-29 | Disposition: A | Discharge status: Home / Self Care | Payer: Self-pay | Source: Ambulatory Visit | Attending: "Endocrinology | Admitting: "Endocrinology

## 2021-05-29 ENCOUNTER — Other Ambulatory Visit: Payer: Self-pay

## 2021-05-29 DIAGNOSIS — E1165 Type 2 diabetes mellitus with hyperglycemia: Secondary | ICD-10-CM | POA: Insufficient documentation

## 2021-05-29 LAB — COMPREHENSIVE METABOLIC PANEL
ALT: 14 U/L (ref 0–44)
AST: 16 U/L (ref 15–41)
Albumin: 3.8 g/dL (ref 3.5–5.0)
Alkaline Phosphatase: 67 U/L (ref 38–126)
Anion gap: 7 (ref 5–15)
BUN: 17 mg/dL (ref 8–23)
CO2: 28 mmol/L (ref 22–32)
Calcium: 8.9 mg/dL (ref 8.9–10.3)
Chloride: 104 mmol/L (ref 98–111)
Creatinine, Ser: 0.78 mg/dL (ref 0.44–1.00)
GFR, Estimated: >60 mL/min (ref >60)
Glucose, Bld: 206 mg/dL — ABNORMAL HIGH (ref 70–99)
Potassium: 3.9 mmol/L (ref 3.5–5.1)
Sodium: 139 mmol/L (ref 135–145)
Total Bilirubin: <0.1 mg/dL — ABNORMAL LOW (ref 0.3–1.2)
Total Protein: 7.7 g/dL (ref 6.5–8.1)

## 2021-05-29 IMAGING — CT CT ABD-PELV W/O CM
2 of 4 series · 16 of 46 positions shown, 18 images · non-contrast
Comparison: CT abdomen pelvis dated 10/06/2018. CT abdomen pelvis
dated 03/04/2018.

CLINICAL DATA: 60-year-old female with concern for acute
diverticulitis.

EXAM:
CT ABDOMEN AND PELVIS WITHOUT CONTRAST
TECHNIQUE: Multidetector CT imaging of the abdomen and pelvis was performed
following the standard protocol without IV contrast.

[Series 2: axial st · axial · 0.93mm/px · z∈[-417,+38]mm · 13 of 103 slices shown, 15 images]
[im 6/103  soft-tissue]
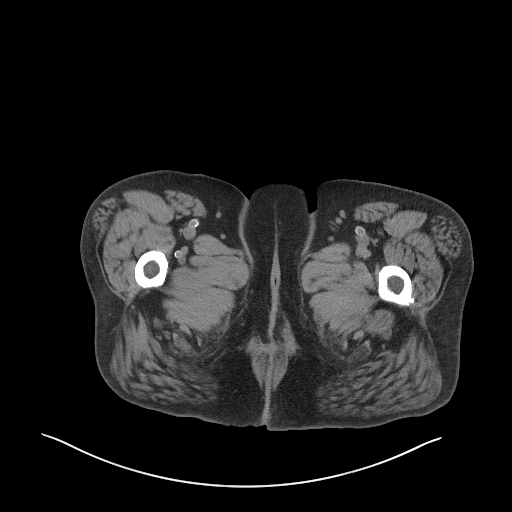
[im 6/103  bone]
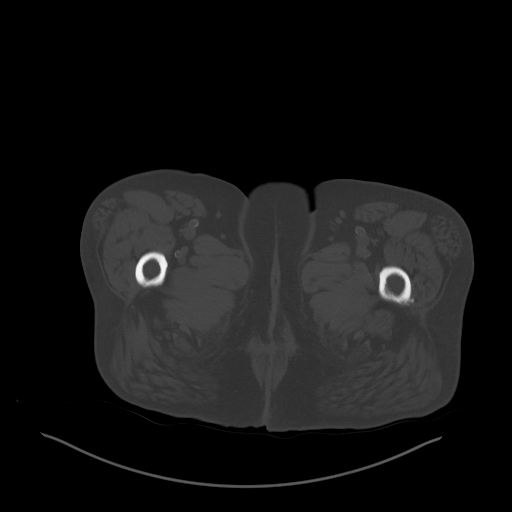
[im 12/103  soft-tissue]
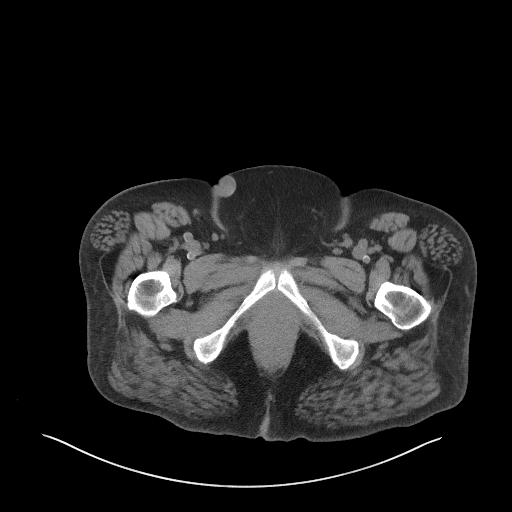
[im 23/103  soft-tissue]
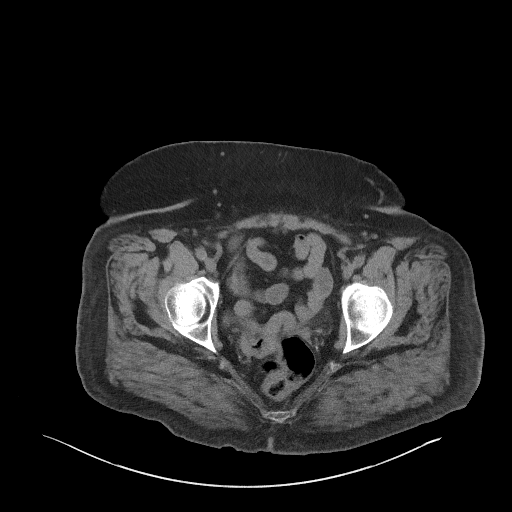
[im 29/103  soft-tissue]
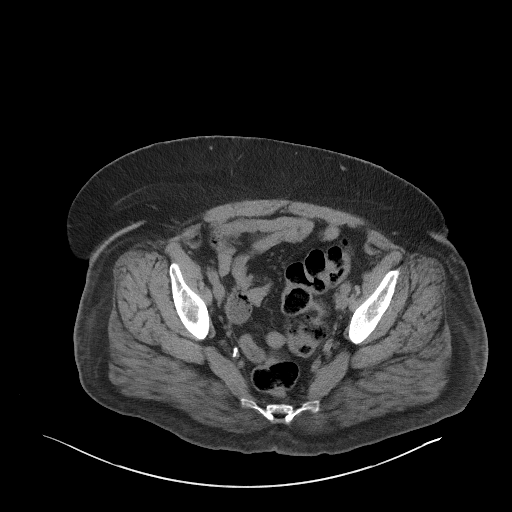
[im 35/103  soft-tissue]
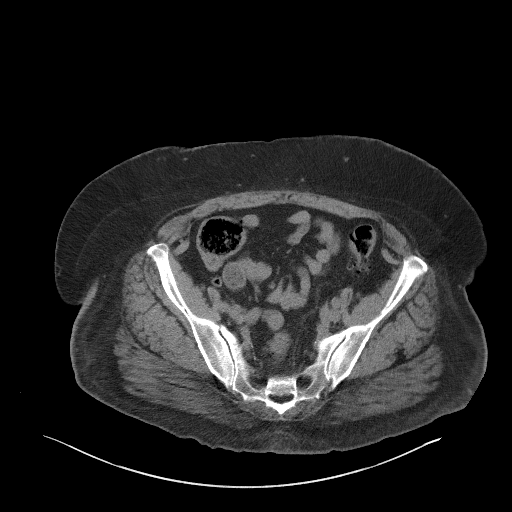
[im 46/103  soft-tissue]
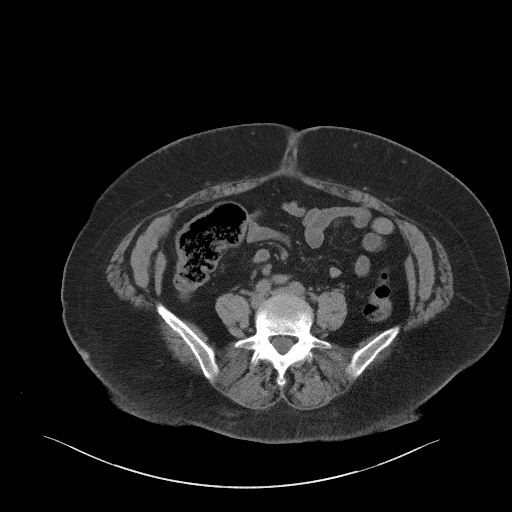
[im 52/103  soft-tissue]
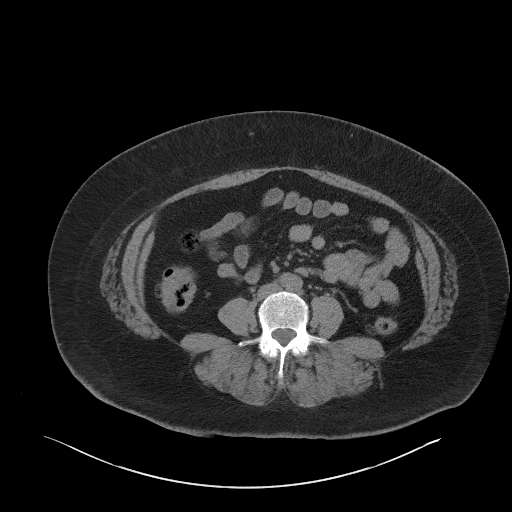
[im 57/103  soft-tissue]
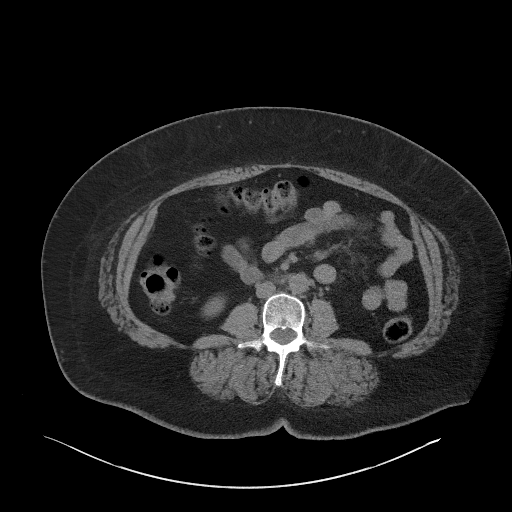
[im 69/103  soft-tissue]
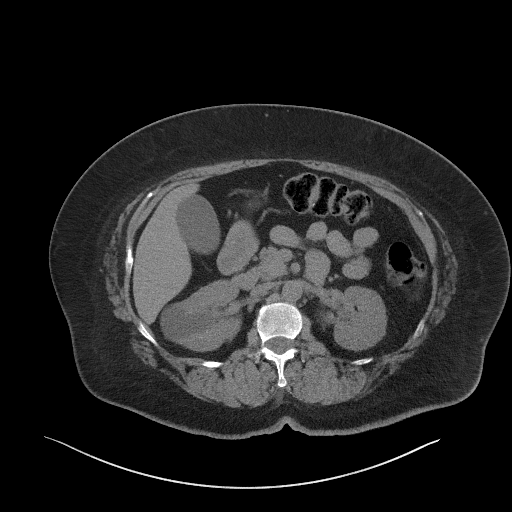
[im 69/103  bone]
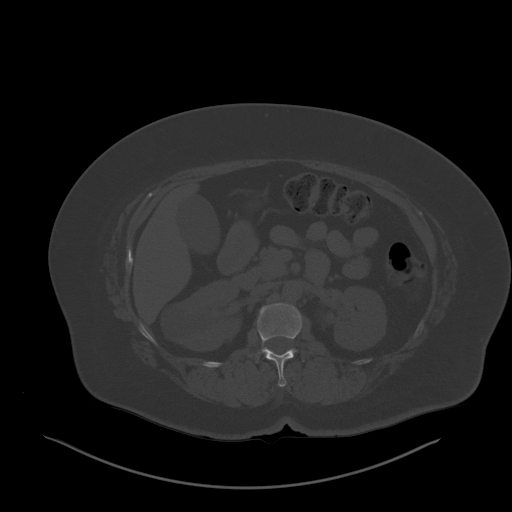
[im 74/103  soft-tissue]
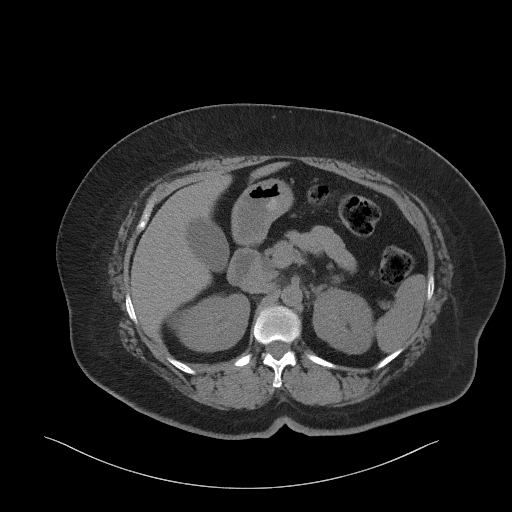
[im 80/103  soft-tissue]
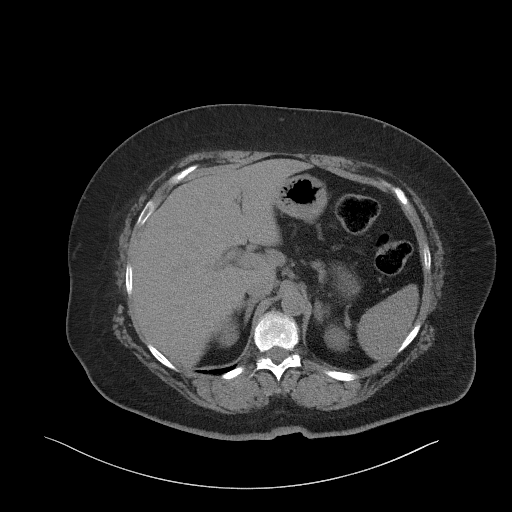
[im 91/103  soft-tissue]
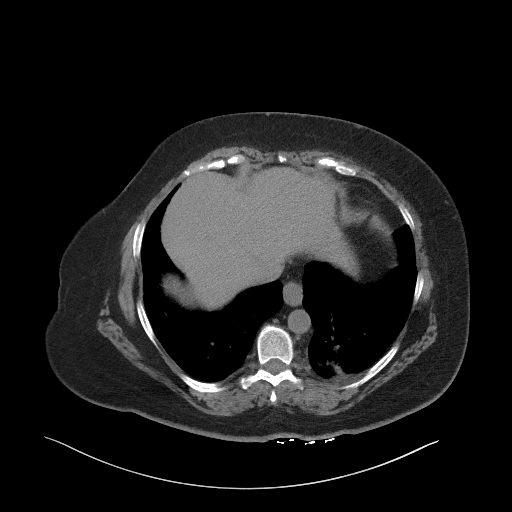
[im 97/103  soft-tissue]
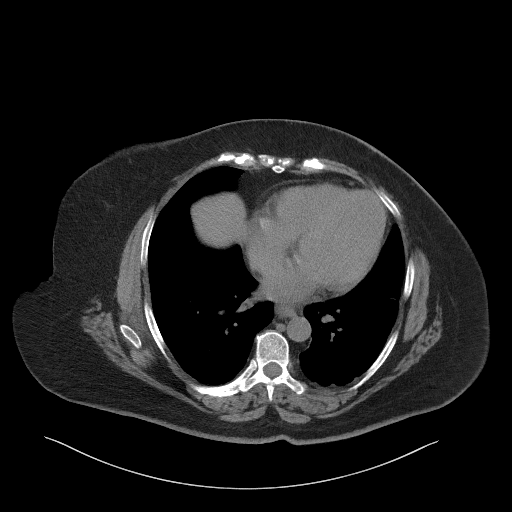

[Series 5: coronal st · coronal · 0.89mm/px · 3 of 133 slices shown]
[im 45/133  soft-tissue]
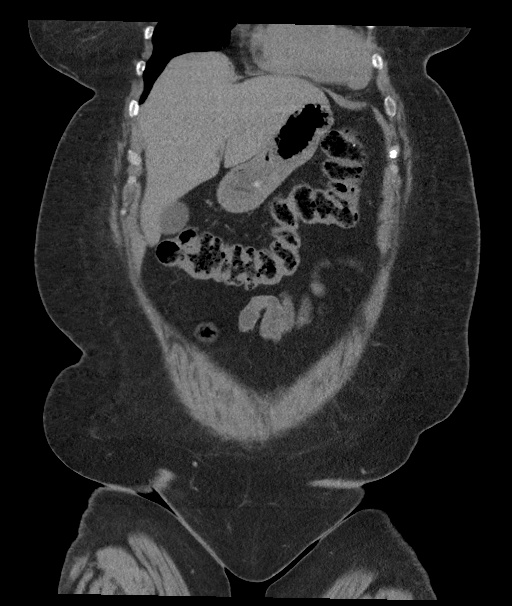
[im 59/133  soft-tissue]
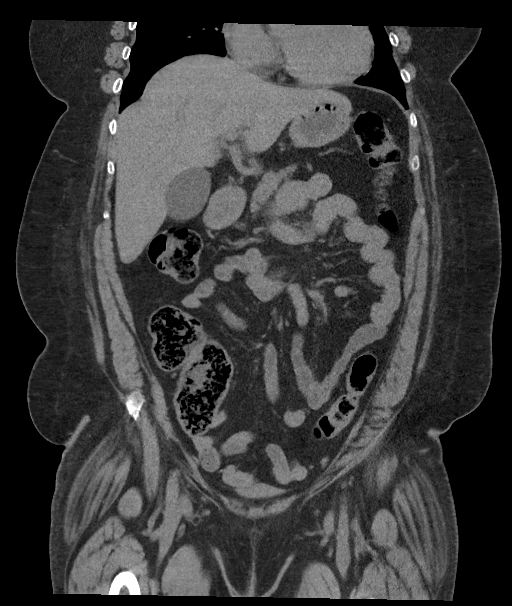
[im 74/133  soft-tissue]
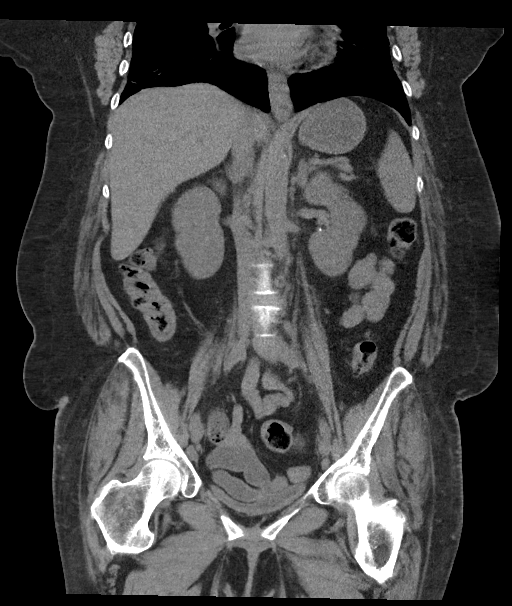

[16 of 46 positions shown; findings below may reference images not displayed]

FINDINGS: Evaluation of this exam is limited in the absence of intravenous
contrast.

Lower chest: Bibasilar linear atelectasis/scarring. The visualized
lung bases are otherwise clear.

No intra-abdominal free air or free fluid.

Hepatobiliary: No focal liver abnormality is seen. No gallstones,
gallbladder wall thickening, or biliary dilatation.

Pancreas: Unremarkable. No pancreatic ductal dilatation or
surrounding inflammatory changes.

Spleen: Normal in size without focal abnormality.

Adrenals/Urinary Tract: The adrenal glands unremarkable. Bilobed
appearing right renal interpolar cyst similar to prior CT. There is
a punctate nonobstructing right renal interpolar calculus. No
hydronephrosis. The left kidney is unremarkable. The visualized
ureters and urinary bladder appear unremarkable.

Stomach/Bowel: There is sigmoid diverticulosis without active
inflammatory changes. There is no bowel obstruction or active
inflammation. The appendix is normal.

Vascular/Lymphatic: Minimal aortoiliac atherosclerotic disease. The
IVC is unremarkable. No portal venous gas. There is no adenopathy.

Reproductive: Hysterectomy. No adnexal masses.

Other: A 2.3 cm ovoid lesion in the subcutaneous soft tissues of the
mons pubis along the right groin which is not characterized but
possibly a sebaceous cyst. Ultrasound may provide better evaluation.

Musculoskeletal: Degenerative changes at L5-S1. No acute osseous
pathology.
IMPRESSION: 1. No acute intra-abdominal or pelvic pathology.
2. Sigmoid diverticulosis. No bowel obstruction. Normal appendix.
3. Punctate nonobstructing right renal interpolar calculus. No
hydronephrosis.
4. Aortic Atherosclerosis ([id number]).

## 2021-05-31 ENCOUNTER — Other Ambulatory Visit: Payer: Self-pay

## 2021-05-31 ENCOUNTER — Ambulatory Visit (INDEPENDENT_AMBULATORY_CARE_PROVIDER_SITE_OTHER): Payer: Self-pay | Admitting: "Endocrinology

## 2021-05-31 ENCOUNTER — Ambulatory Visit: Payer: Self-pay | Admitting: "Endocrinology

## 2021-05-31 ENCOUNTER — Encounter: Payer: Self-pay | Admitting: "Endocrinology

## 2021-05-31 VITALS — BP 136/62 | HR 68 | Ht 64.0 in | Wt 217.0 lb

## 2021-05-31 DIAGNOSIS — F17200 Nicotine dependence, unspecified, uncomplicated: Secondary | ICD-10-CM

## 2021-05-31 DIAGNOSIS — E782 Mixed hyperlipidemia: Secondary | ICD-10-CM

## 2021-05-31 DIAGNOSIS — I10 Essential (primary) hypertension: Secondary | ICD-10-CM

## 2021-05-31 DIAGNOSIS — E1165 Type 2 diabetes mellitus with hyperglycemia: Secondary | ICD-10-CM

## 2021-05-31 LAB — POCT GLYCOSYLATED HEMOGLOBIN (HGB A1C): HbA1c, POC (controlled diabetic range): 7.5 % — AB (ref 0.0–7.0)

## 2021-05-31 NOTE — Patient Instructions (Signed)

## 2021-05-31 NOTE — Progress Notes (Signed)
05/31/2021, 4:20 PM  Endocrinology follow-up note   Subjective:    Patient ID: Casey Johnston, female    DOB: [birth year].  Casey Johnston is being seen in follow-up after she was seen in consultation for management of currently uncontrolled type 2 diabetes, hyperlipidemia, hypertension. PCP: Casey Dryer, PA-C.   Past Medical History:  Diagnosis Date   Anxiety and depression    Diabetes mellitus, type II ([place])    Dr. Dorris Fetch managing   Frequent headaches chronic   ibuprofen helps some   History of UTI    Recurrent per pt (approx 3 per year)   Hyperlipidemia    Hypertension    Leukocytosis 12/2015   mild lymphocytosis.  Path smear review reassuring.  Repeat 01/2016 stable.   Morbid obesity ([place])    OSA on CPAP     Past Surgical History:  Procedure Laterality Date   ABDOMINAL HYSTERECTOMY  1998   Dysfunctional uterine bleeding.  No hx of abnormal paps.   BIOPSY  10/20/2018   Procedure: BIOPSY;  Surgeon: Danie Binder, MD;  [facility]: [facility];  Service: Endoscopy;;  stomach   COLONOSCOPY  2012   Dr. Earley Brooke: 1.5 cm pendunculated descending colon polyp (tubulovillous adenoma) and diminutive hyperplastic polyps   COLONOSCOPY N/A 10/20/2018   Procedure: COLONOSCOPY;  Surgeon: Danie Binder, MD; 4 tubular adenomas, diverticulosis in the sigmoid colon, tortuous left colon, external and internal hemorrhoids. Due for repeat in 2023.    ESOPHAGOGASTRODUODENOSCOPY N/A 10/20/2018   Procedure: ESOPHAGOGASTRODUODENOSCOPY (EGD);  Surgeon: Danie Binder, MD;  gastritis and duodenitis. No H. Pylori.    POLYPECTOMY  10/20/2018   Procedure: POLYPECTOMY;  Surgeon: Danie Binder, MD;  Location: [facility];  Service: Endoscopy;;   wart removal  08/04/2019   benign    Social History   Socioeconomic History   Marital status: Divorced    Spouse name: Not on file   Number of children: Not on file    Years of education: Not on file   Highest education level: Not on file  Occupational History   Not on file  Tobacco Use   Smoking status: Every Day    Packs/day: 0.25    Years: 20.00    Pack years: 5.00    Types: Cigarettes   Smokeless tobacco: Never  Vaping Use   Vaping Use: Never used  Substance and Sexual Activity   Alcohol use: No   Drug use: No   Sexual activity: Not on file  Other Topics Concern   Not on file  Social History Narrative   Separated x 20 yrs, one daughter.  She lives with her.  Great niece and nephew live with her as well.   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   Tob: 7 pack year hx, quit 2010.   Alc: none   Exercise: none   Social Determinants of Health   Financial Resource Strain: Not on file  Food Insecurity: Not on file  Transportation Needs: Not on file  Physical Activity: Not on file  Stress: Not on file  Social Connections: Not on file    Family History  Problem Relation Age of Onset   Cancer Mother        ovarian   COPD Father    Diabetes Father    Hyperlipidemia Father    Heart attack Father    Heart failure Father    Colon cancer Neg Hx     Outpatient Encounter Medications as of 05/31/2021  Medication Sig   gabapentin (NEURONTIN) 100 MG capsule Take 1 capsule (100 mg total) by mouth every 8 (eight) hours as needed.   albuterol (VENTOLIN HFA) 108 (90 Base) MCG/ACT inhaler INHALE 2 PUFFS BY MOUTH EVERY 6 HOURS AS NEEDED FOR COUGHING, WHEEZING, OR SHORTNESS OF BREATH   atorvastatin (LIPITOR) 20 MG tablet TAKE 1 Tablet BY MOUTH ONCE EVERY DAY   cetirizine (ZYRTEC) 10 MG tablet Take 10 mg by mouth daily.   cloNIDine (CATAPRES) 0.1 MG tablet TAKE 1 Tablet  BY MOUTH TWICE DAILY   glipiZIDE (GLUCOTROL) 5 MG tablet Take 1 tablet (5 mg total) by mouth 2 (two) times daily before a meal.   hydrALAZINE (APRESOLINE) 10 MG tablet TAKE 1 Tablet BY MOUTH 3 TIMES DAILY   hydrochlorothiazide (HYDRODIURIL) 25 MG tablet TAKE 1/2 Tablet BY MOUTH  ONCE DAILY   Insulin Aspart (NOVOLOG FLEXPEN [place]) Inject 6-12 Units into the skin 3 (three) times daily before meals.   insulin glargine (LANTUS) 100 UNIT/ML injection Inject 80 Units into the skin at bedtime.   losartan (COZAAR) 100 MG tablet TAKE 1 Tablet BY MOUTH ONCE EVERY DAY   lubiprostone (AMITIZA) 24 MCG capsule 1 PO QHS FOR 7 DAYS THEN 1 PO BID. TAKE WITH FOOD.   meloxicam (MOBIC) 15 MG tablet TAKE 1/2 Tablet BY MOUTH ONCE EVERY DAY   meloxicam (MOBIC) 7.5 MG tablet Take 1 tablet (7.5 mg total) by mouth daily.   metFORMIN (GLUCOPHAGE) 1000 MG tablet TAKE 1 Tablet  BY MOUTH TWICE DAILY WITH A MEAL   metoprolol tartrate (LOPRESSOR) 100 MG tablet TAKE 1 Tablet  BY MOUTH TWICE DAILY   naproxen (NAPROSYN) 500 MG tablet Take 1 tablet (500 mg total) by mouth 2 (two) times daily with a meal.   Omega-3 Fatty Acids (FISH OIL) 1000 MG CAPS Take 1,000 mg by mouth daily.   pantoprazole (PROTONIX) 40 MG tablet Take 1 tablet (40 mg total) by mouth daily.   No facility-administered encounter medications on file as of 05/31/2021.    ALLERGIES: Allergies  Allergen Reactions   Amlodipine Swelling   Januvia [Sitagliptin] Other (See Comments)    Caused pancreatitis    VACCINATION STATUS: Immunization History  Administered Date(s) Administered   Tdap 02/17/2016    Diabetes She presents for her follow-up diabetic visit. She has type 2 diabetes mellitus. Onset time: She was diagnosed at approximate age of 61 years.  She was previously seen in this clinic prior to 2018. Her disease course has been stable. There are no hypoglycemic associated symptoms. Pertinent negatives for hypoglycemia include no confusion, headaches, pallor or seizures. Associated symptoms include fatigue, polydipsia and polyuria. Pertinent negatives for diabetes include no chest pain and no polyphagia. There are no hypoglycemic complications. Symptoms are stable. (Noncompliance.) Risk factors for coronary artery disease include  diabetes mellitus, dyslipidemia, hypertension, tobacco exposure, post-menopausal, sedentary lifestyle, family history and obesity. Current diabetic treatment includes insulin injections. Her weight is fluctuating minimally. She is following a generally unhealthy diet. When asked about meal planning, she reported none. She has not had a previous visit with a dietitian. She never participates in exercise. Her home blood glucose  trend is decreasing steadily. Her breakfast blood glucose range is generally 140-180 mg/dl. Her lunch blood glucose range is generally 140-180 mg/dl. Her dinner blood glucose range is generally 140-180 mg/dl. Her bedtime blood glucose range is generally 140-180 mg/dl. Her overall blood glucose range is 140-180 mg/dl. (She presents with continued improvement in her glycemic profile.  Her point-of-care A1c 7.5% progressively improving from 11%.  She did not document any hypoglycemia.    ) An ACE inhibitor/angiotensin II receptor blocker is being taken. Eye exam is current.  Hyperlipidemia This is a chronic problem. The current episode started more than 1 year ago. The problem is uncontrolled. Exacerbating diseases include diabetes and obesity. Pertinent negatives include no chest pain, myalgias or shortness of breath. Current antihyperlipidemic treatment includes statins. Risk factors for coronary artery disease include diabetes mellitus, dyslipidemia, hypertension, a sedentary lifestyle, post-menopausal, obesity and family history.  Hypertension This is a chronic problem. The current episode started more than 1 year ago. The problem is controlled. Pertinent negatives include no chest pain, headaches, palpitations or shortness of breath. Risk factors for coronary artery disease include diabetes mellitus, dyslipidemia, family history, obesity, post-menopausal state, sedentary lifestyle and smoking/tobacco exposure. Past treatments include angiotensin blockers.    Review of Systems   Constitutional:  Positive for fatigue. Negative for chills, fever and unexpected weight change.  HENT:  Negative for trouble swallowing and voice change.   Eyes:  Negative for visual disturbance.  Respiratory:  Negative for cough, shortness of breath and wheezing.   Cardiovascular:  Negative for chest pain, palpitations and leg swelling.  Gastrointestinal:  Negative for diarrhea, nausea and vomiting.  Endocrine: Positive for polydipsia and polyuria. Negative for cold intolerance, heat intolerance and polyphagia.  Musculoskeletal:  Negative for arthralgias and myalgias.  Skin:  Negative for color change, pallor, rash and wound.  Neurological:  Negative for seizures and headaches.  Psychiatric/Behavioral:  Negative for confusion and suicidal ideas.    Objective:    Vitals with BMI 05/31/2021 02/21/2021 01/19/2021  Height 5\' 4"  - 5\' 4"   Weight 217 lbs 204 lbs 203 lbs  BMI 19.62 - 22.97  Systolic 989 211 941  Diastolic 62 70 66  Pulse 68 90 72    BP 136/62    Pulse 68    Ht 5\' 4"  (1.626 m)    Wt 217 lb (98.4 kg)    BMI 37.25 kg/m   Wt Readings from Last 3 Encounters:  05/31/21 217 lb (98.4 kg)  02/21/21 204 lb (92.5 kg)  01/19/21 203 lb (92.1 kg)     Physical Exam Constitutional:      Appearance: She is well-developed.  HENT:     Head: Normocephalic and atraumatic.  Neck:     Thyroid: No thyromegaly.     Trachea: No tracheal deviation.  Cardiovascular:     Rate and Rhythm: Normal rate and regular rhythm.  Pulmonary:     Effort: Pulmonary effort is normal.  Abdominal:     Tenderness: There is no abdominal tenderness. There is no guarding.  Musculoskeletal:        General: Normal range of motion.     Cervical back: Normal range of motion and neck supple.  Skin:    General: Skin is warm and dry.     Coloration: Skin is not pale.     Findings: No erythema or rash.  Neurological:     Mental Status: She is alert and oriented to person, place, and time.     Cranial Nerves:  No  cranial nerve deficit.     Coordination: Coordination normal.     Deep Tendon Reflexes: Reflexes are normal and symmetric.  Psychiatric:     Comments: Reluctant affect.      CMP ( most recent) CMP     Component Value Date/Time   NA 139 05/29/2021 0949   K 3.9 05/29/2021 0949   CL 104 05/29/2021 0949   CO2 28 05/29/2021 0949   GLUCOSE 206 (H) 05/29/2021 0949   BUN 17 05/29/2021 0949   CREATININE 0.78 05/29/2021 0949   CREATININE 0.66 08/27/2016 1521   CALCIUM 8.9 05/29/2021 0949   PROT 7.7 05/29/2021 0949   ALBUMIN 3.8 05/29/2021 0949   AST 16 05/29/2021 0949   ALT 14 05/29/2021 0949   ALKPHOS 67 05/29/2021 0949   BILITOT <0.1 (L) 05/29/2021 0949   GFRNONAA >60 05/29/2021 0949   GFRAA >60 02/09/2020 1054     Diabetic Labs (most recent): Lab Results  Component Value Date   HGBA1C 7.5 (A) 05/31/2021   HGBA1C 7.2 01/19/2021   HGBA1C 7.9 (A) 10/19/2020     Lipid Panel ( most recent) Lipid Panel     Component Value Date/Time   CHOL 155 09/07/2020 1550   TRIG 173 (H) 09/07/2020 1550   HDL 41 09/07/2020 1550   CHOLHDL 3.8 09/07/2020 1550   VLDL 35 09/07/2020 1550   LDLCALC 79 09/07/2020 1550   LDLDIRECT 119.0 01/18/2016 1038      Lab Results  Component Value Date   TSH 3.547 10/11/2020   TSH 1.23 01/18/2016   FREET4 0.69 10/11/2020      Assessment & Plan:   1. Uncontrolled type 2 diabetes mellitus with hyperglycemia ([place])   - Casey Johnston has currently uncontrolled symptomatic type 2 DM since  61 years of age,  with most recent A1c of 10.8 %. Recent labs reviewed.  She presents with continued improvement in her glycemic profile.  Her point-of-care A1c 7.5% progressively improving from 11%.  She did not document any hypoglycemia.    - I had a long discussion with her about the progressive nature of diabetes and the pathology behind its complications. -her diabetes is complicated by noncompliance/nonadherence, chronic smoking, obesity/sedentary  life and she remains at a high risk for more acute and chronic complications which include CAD, CVA, CKD, retinopathy, and neuropathy. These are all discussed in detail with her.  - I have counseled her on diet  and weight management  by adopting a carbohydrate restricted/protein rich diet. Patient is encouraged to switch to  unprocessed or minimally processed     complex starch and increased protein intake (animal or plant source), fruits, and vegetables. -  she is advised to stick to a routine mealtimes to eat 3 meals  a day and avoid unnecessary snacks ( to snack only to correct hypoglycemia).   - she acknowledges that there is a room for improvement in her food and drink choices. - Suggestion is made for her to avoid simple carbohydrates  from her diet including Cakes, Sweet Desserts, Ice Cream, Soda (diet and regular), Sweet Tea, Candies, Chips, Cookies, Store Bought Juices, Alcohol , Artificial Sweeteners,  Coffee Creamer, and "Sugar-free" Products, Lemonade. This will help patient to have more stable blood glucose profile and potentially avoid unintended weight gain.  The following Lifestyle Medicine recommendations according to [place]  [facility]) were discussed and and offered to patient and she  agrees to start the journey:  A. Whole Foods,  Plant-Based Nutrition comprising of fruits and vegetables, plant-based proteins, whole-grain carbohydrates was discussed in detail with the patient.   A list for source of those nutrients were also provided to the patient.  Patient will use only water or unsweetened tea for hydration. B.  The need to stay away from risky substances including alcohol, smoking; obtaining 7 to 9 hours of restorative sleep, at least 150 minutes of moderate intensity exercise weekly, the importance of healthy social connections,  and stress management techniques were discussed.    - I have approached her with the following individualized plan to  manage  her diabetes and patient agrees:   -In light of her presentation with near target glycemic profile, she will be continued on same intensive basal/bolus insulin treatment regimen .  -she is advised to continue Lantus 80 units nightly, continue NovoLog 6-12  units 3 times a day with meals  for pre-meal BG readings of 90-150mg /dl, plus patient specific correction dose for unexpected hyperglycemia above 150mg /dl, associated with strict monitoring of glucose 4 times a day-before meals and at bedtime. - she is warned not to take insulin without proper monitoring per orders. - Adjustment parameters are given to her for hypo and hyperglycemia in writing. - she is encouraged to call clinic for blood glucose levels less than 70 or above 300 mg /dl. - she is advised to continue metformin 1000 mg p.o. twice daily, and glipizide 5 mg XL p.o. daily at breakfast.      - she is high risk for pancreatitis due to chronic heavy smoking, will avoid incretin therapy for now.   - Specific targets for  A1c;  LDL, HDL,  and Triglycerides were discussed with the patient.  2) Blood Pressure /Hypertension:   Her blood pressure is controlled to target.   she is advised to continue her current medications including amlodipine 10 mg p.o. daily, clonidine 0.1 mg p.o. twice daily, The patient was counseled on the dangers of tobacco use, and was advised to quit.  Reviewed strategies to maximize success, including removing cigarettes and smoking materials from environment.   3) Lipids/Hyperlipidemia:   Review of her recent lipid panel showed uncontrolled  LDL at 73 .  she  is advised to continue Atorvastatin 20 mg p.o. daily at bedtime.  Side effects and precautions discussed with her.    4)  Weight/Diet:  Body mass index is 37.25 kg/m.  -   clearly complicating her diabetes care.   she is  a candidate for weight loss. I discussed with her the fact that loss of 5 - 10% of her  current body weight will have the most  impact on her diabetes management.  Exercise, and detailed carbohydrates information provided  -  detailed on discharge instructions.  5) Chronic Care/Health Maintenance:  -she  is on ACEI/ARB and Statin medications and  is encouraged to initiate and continue to follow up with Ophthalmology, Dentist,  Podiatrist at least yearly or according to recommendations, and advised to  quit smoking. I have recommended yearly flu vaccine and pneumonia vaccine at least every 5 years; moderate intensity exercise for up to 150 minutes weekly; and  sleep for at least 7 hours a day.   Her recent point-of-care screening ABI was negative for PAD.  This study will be repeated in March 2027, or sooner if needed.  - she is  advised to maintain close follow up with Casey Dryer, PA-C for primary care needs, as well as her other providers for optimal  and coordinated care.    I spent 41 minutes in the care of the patient today including review of labs from [place], Lipids, Thyroid Function, Hematology (current and previous including abstractions from other facilities); face-to-face time discussing  her blood glucose readings/logs, discussing hypoglycemia and hyperglycemia episodes and symptoms, medications doses, her options of short and long term treatment based on the latest standards of care / guidelines;  discussion about incorporating lifestyle medicine;  and documenting the encounter.    Please refer to Patient Instructions for Blood Glucose Monitoring and Insulin/Medications Dosing Guide"  in media tab for additional information. Please  also refer to " Patient Self Inventory" in the Media  tab for reviewed elements of pertinent patient history.  Casey Johnston participated in the discussions, expressed understanding, and voiced agreement with the above plans.  All questions were answered to her satisfaction. she is encouraged to contact clinic should she have any questions or concerns prior to her return  visit.    Follow up plan: - Return in about 4 months (around 09/28/2021) for Bring Meter and Logs- A1c in Office.  Glade Lloyd, MD [facility] [address] [place], [place] [address] Phone: [phone]  Fax: [phone]    05/31/2021, 4:20 PM  This note was partially dictated with voice recognition software. Similar sounding words can be transcribed inadequately or may not  be corrected upon review.

## 2021-06-06 ENCOUNTER — Encounter: Payer: Self-pay | Admitting: Physician Assistant

## 2021-06-06 ENCOUNTER — Ambulatory Visit: Payer: HRSA Program | Admitting: Physician Assistant

## 2021-06-06 ENCOUNTER — Other Ambulatory Visit: Payer: Self-pay

## 2021-06-06 VITALS — BP 121/60 | HR 63 | Temp 97.7°F | Wt 216.0 lb

## 2021-06-06 DIAGNOSIS — Z5320 Procedure and treatment not carried out because of patient's decision for unspecified reasons: Secondary | ICD-10-CM

## 2021-06-06 DIAGNOSIS — E669 Obesity, unspecified: Secondary | ICD-10-CM

## 2021-06-06 DIAGNOSIS — I10 Essential (primary) hypertension: Secondary | ICD-10-CM

## 2021-06-06 DIAGNOSIS — E1165 Type 2 diabetes mellitus with hyperglycemia: Secondary | ICD-10-CM

## 2021-06-06 DIAGNOSIS — E785 Hyperlipidemia, unspecified: Secondary | ICD-10-CM

## 2021-06-06 DIAGNOSIS — F17200 Nicotine dependence, unspecified, uncomplicated: Secondary | ICD-10-CM

## 2021-06-06 NOTE — Patient Instructions (Addendum)
Update enrollment (expired august 2021)

## 2021-06-06 NOTE — Progress Notes (Signed)
BP 121/60    Pulse 63    Temp 97.7 F (36.5 C)    Wt 216 lb (98 kg)    SpO2 99%    BMI 37.08 kg/m    Subjective:    Patient ID: Casey Johnston, female    DOB: [birth year], 61 y.o.   MRN: [id number]  HPI: Casey Johnston is a 61 y.o. female presenting on 06/06/2021 for Hyperlipidemia and Hypertension   HPI   Chief Complaint  Patient presents with   Hyperlipidemia   Hypertension     Pt works for [employer] thru a [employer].   She says she is unable to get hired with [employer] directly.  Pt is now seeing endocrinology for her DM.  Pt says her Jerrye Bushy is doing good.  She says her Breathing and mood are good also.  She has no complaints today.   Pt did not get her labs drawn yet.  She has not yet gotten covid vaccination.    Relevant past medical, surgical, family and social history reviewed and updated as indicated. Interim medical history since our last visit reviewed. Allergies and medications reviewed and updated.   Current Outpatient Medications:    atorvastatin (LIPITOR) 20 MG tablet, TAKE 1 Tablet BY MOUTH ONCE EVERY DAY, Disp: 90 tablet, Rfl: 1   cetirizine (ZYRTEC) 10 MG tablet, Take 10 mg by mouth daily., Disp: , Rfl:    cloNIDine (CATAPRES) 0.1 MG tablet, TAKE 1 Tablet  BY MOUTH TWICE DAILY, Disp: 180 tablet, Rfl: 1   gabapentin (NEURONTIN) 100 MG capsule, Take 1 capsule (100 mg total) by mouth every 8 (eight) hours as needed., Disp: 90 capsule, Rfl: 1   glipiZIDE (GLUCOTROL) 5 MG tablet, Take 1 tablet (5 mg total) by mouth 2 (two) times daily before a meal., Disp: 180 tablet, Rfl: 3   hydrALAZINE (APRESOLINE) 10 MG tablet, TAKE 1 Tablet BY MOUTH 3 TIMES DAILY, Disp: 270 tablet, Rfl: 1   hydrochlorothiazide (HYDRODIURIL) 25 MG tablet, TAKE 1/2 Tablet BY MOUTH ONCE DAILY, Disp: 45 tablet, Rfl: 1   Insulin Aspart (NOVOLOG FLEXPEN [place]), Inject 6-12 Units into the skin 3 (three) times daily before meals., Disp: , Rfl:    insulin glargine (LANTUS) 100 UNIT/ML injection,  Inject 80 Units into the skin at bedtime., Disp: , Rfl:    losartan (COZAAR) 100 MG tablet, TAKE 1 Tablet BY MOUTH ONCE EVERY DAY, Disp: 90 tablet, Rfl: 1   meloxicam (MOBIC) 15 MG tablet, TAKE 1/2 Tablet BY MOUTH ONCE EVERY DAY, Disp: 15 tablet, Rfl: 5   metFORMIN (GLUCOPHAGE) 1000 MG tablet, TAKE 1 Tablet  BY MOUTH TWICE DAILY WITH A MEAL, Disp: 180 tablet, Rfl: 1   metoprolol tartrate (LOPRESSOR) 100 MG tablet, TAKE 1 Tablet  BY MOUTH TWICE DAILY, Disp: 180 tablet, Rfl: 1   pantoprazole (PROTONIX) 40 MG tablet, Take 1 tablet (40 mg total) by mouth daily., Disp: 30 tablet, Rfl: 11   albuterol (VENTOLIN HFA) 108 (90 Base) MCG/ACT inhaler, INHALE 2 PUFFS BY MOUTH EVERY 6 HOURS AS NEEDED FOR COUGHING, WHEEZING, OR SHORTNESS OF BREATH (Patient not taking: Reported on 06/06/2021), Disp: 20.1 g, Rfl: 1   lubiprostone (AMITIZA) 24 MCG capsule, 1 PO QHS FOR 7 DAYS THEN 1 PO BID. TAKE WITH FOOD. (Patient not taking: Reported on 06/06/2021), Disp: 62 capsule, Rfl: 11   meloxicam (MOBIC) 7.5 MG tablet, Take 1 tablet (7.5 mg total) by mouth daily. (Patient not taking: Reported on 06/06/2021), Disp: 30 tablet, Rfl: 5  naproxen (NAPROSYN) 500 MG tablet, Take 1 tablet (500 mg total) by mouth 2 (two) times daily with a meal. (Patient not taking: Reported on 06/06/2021), Disp: 30 tablet, Rfl: 0   Omega-3 Fatty Acids (FISH OIL) 1000 MG CAPS, Take 1,000 mg by mouth daily. (Patient not taking: Reported on 06/06/2021), Disp: , Rfl:     Review of Systems  Per HPI unless specifically indicated above     Objective:    BP 121/60    Pulse 63    Temp 97.7 F (36.5 C)    Wt 216 lb (98 kg)    SpO2 99%    BMI 37.08 kg/m   Wt Readings from Last 3 Encounters:  06/06/21 216 lb (98 kg)  05/31/21 217 lb (98.4 kg)  02/21/21 204 lb (92.5 kg)    Physical Exam Vitals reviewed.  Constitutional:      General: She is not in acute distress.    Appearance: She is well-developed. She is not toxic-appearing.  HENT:     Head:  Normocephalic and atraumatic.  Cardiovascular:     Rate and Rhythm: Normal rate and regular rhythm.  Pulmonary:     Effort: Pulmonary effort is normal.     Breath sounds: Normal breath sounds.  Abdominal:     General: Bowel sounds are normal.     Palpations: Abdomen is soft. There is no mass.     Tenderness: There is no abdominal tenderness.  Musculoskeletal:     Cervical back: Neck supple.     Right lower leg: No edema.     Left lower leg: No edema.  Lymphadenopathy:     Cervical: No cervical adenopathy.  Skin:    General: Skin is warm and dry.  Neurological:     Mental Status: She is alert and oriented to person, place, and time.  Psychiatric:        Behavior: Behavior normal.          Assessment & Plan:   Encounter Diagnoses  Name Primary?   Essential hypertension, benign Yes   Hyperlipidemia, unspecified hyperlipidemia type    Uncontrolled type 2 diabetes mellitus with hyperglycemia (HCC)    Tobacco use disorder    Obesity, unspecified classification, unspecified obesity type, unspecified whether serious comorbidity present    Screening mammography declined        -pt Declined screening mammogram -pt to Get fasting labs drawn; she will be called with results -no changes in medications -DM foot exam updated -pt educated and encouraged to get Covid vaccination -pt to update Re-enrollment.  Discussed with her that hers expired in august 2021 -pt to continue with endocrinology for DM management -pt to follow up 4 months.  She is to contact office sooner prn

## 2021-06-08 ENCOUNTER — Telehealth: Payer: Self-pay

## 2021-06-08 NOTE — Telephone Encounter (Signed)
Called pt to inform she needs to complete financial assistance application in order to continue receiving insulin, left vm to call back.

## 2021-06-15 ENCOUNTER — Telehealth: Payer: Self-pay

## 2021-06-15 NOTE — Telephone Encounter (Addendum)
Called pt left vm to call back regarding cafa.

## 2021-06-15 NOTE — Telephone Encounter (Signed)
Spoke with pt who informed she has already submitted info with [facility] and is waiting for response, stated she would call to check with them today.

## 2021-06-25 NOTE — Progress Notes (Signed)
Referring Provider: Soyla Dryer, PA-C Primary Care Physician:  Soyla Dryer, PA-C Primary GI Physician: Dr. Abbey Chatters  Chief Complaint  Patient presents with   Gastroesophageal Reflux    ok   Constipation    HPI:   Casey Johnston is a 61 y.o. female with history of GERD, constipation, chronic abdominal pain that seems to be associated with underlying constipation, likely IBS-C.  Previously failed Amitiza 24 mcg twice daily with MiraLAX daily.  Linzess 145 mcg daily had worked well.  TCS and EGD in June 2020. TCS with 4 tubular adenomas, diverticulosis in the sigmoid colon, tortuous left colon, external and internal hemorrhoids. Due for repeat in 2023. EGD with gastritis and duodenitis. No H. Pylori. Also with history of 1.2 cm subcutaneous nodules in the right lower anterior abdominal wall noted on CT in May 2020 s/p biopsy revealing benign epidermoid cyst.  She is presenting today for follow-up.   Last seen in our office 12/23/2019.  Reported Linzess was denied by the health department based on 2019 taxes.  Amitiza 24 mcg was not working well.  Not taking MiraLAX.  BMs no more than once a week.  No alarm symptoms.  GERD well controlled with Protonix daily.  No alarm symptoms.  Planned to resume Linzess 145 mcg, resubmit for patient assistance, continue Protonix 40 mg daily, follow-up in 6 months.  She cancelled 2 follow-up appointments.   Today:   Constipation: Bms every 3-4 days. Stools are hard. No brbpr or melena. No abdominal pain. No unintentional weight loss. Taking 2 Senokot every other day.  No longer taking Amitiza as it was not helpful previously.  MiraLAX not helpful.   GERD is well controlled on Protonix 40 mg daily. No dysphagia, nausea, vomiting.  Past Medical History:  Diagnosis Date   Anxiety and depression    Diabetes mellitus, type II ([place])    Dr. Dorris Fetch managing   Frequent headaches chronic   ibuprofen helps some   History of UTI    Recurrent per pt  (approx 3 per year)   Hyperlipidemia    Hypertension    Leukocytosis 12/2015   mild lymphocytosis.  Path smear review reassuring.  Repeat 01/2016 stable.   Morbid obesity ([place])    OSA on CPAP     Past Surgical History:  Procedure Laterality Date   ABDOMINAL HYSTERECTOMY  1998   Dysfunctional uterine bleeding.  No hx of abnormal paps.   BIOPSY  10/20/2018   Procedure: BIOPSY;  Surgeon: Danie Binder, MD;  [facility]: [facility];  Service: Endoscopy;;  stomach   COLONOSCOPY  2012   Dr. Earley Brooke: 1.5 cm pendunculated descending colon polyp (tubulovillous adenoma) and diminutive hyperplastic polyps   COLONOSCOPY N/A 10/20/2018   Procedure: COLONOSCOPY;  Surgeon: Danie Binder, MD; 4 tubular adenomas, diverticulosis in the sigmoid colon, tortuous left colon, external and internal hemorrhoids. Due for repeat in 2023.    ESOPHAGOGASTRODUODENOSCOPY N/A 10/20/2018   Procedure: ESOPHAGOGASTRODUODENOSCOPY (EGD);  Surgeon: Danie Binder, MD;  gastritis and duodenitis. No H. Pylori.    POLYPECTOMY  10/20/2018   Procedure: POLYPECTOMY;  Surgeon: Danie Binder, MD;  [facility]: [facility];  Service: Endoscopy;;   wart removal  08/04/2019   benign    Current Outpatient Medications  Medication Sig Dispense Refill   albuterol (VENTOLIN HFA) 108 (90 Base) MCG/ACT inhaler INHALE 2 PUFFS BY MOUTH EVERY 6 HOURS AS NEEDED FOR COUGHING, WHEEZING, OR SHORTNESS OF BREATH 20.1 g 1   atorvastatin (LIPITOR)  20 MG tablet TAKE 1 Tablet BY MOUTH ONCE EVERY DAY 90 tablet 1   cetirizine (ZYRTEC) 10 MG tablet Take 10 mg by mouth daily.     cloNIDine (CATAPRES) 0.1 MG tablet TAKE 1 Tablet  BY MOUTH TWICE DAILY 180 tablet 1   gabapentin (NEURONTIN) 100 MG capsule Take 1 capsule (100 mg total) by mouth every 8 (eight) hours as needed. 90 capsule 1   glipiZIDE (GLUCOTROL) 5 MG tablet Take 1 tablet (5 mg total) by mouth 2 (two) times daily before a meal. 180 tablet 3   hydrALAZINE (APRESOLINE) 10 MG tablet TAKE 1 Tablet  BY MOUTH 3 TIMES DAILY 270 tablet 1   hydrochlorothiazide (HYDRODIURIL) 25 MG tablet TAKE 1/2 Tablet BY MOUTH ONCE DAILY 45 tablet 1   Insulin Aspart (NOVOLOG FLEXPEN [place]) Inject 6-12 Units into the skin 3 (three) times daily before meals.     insulin glargine (LANTUS) 100 UNIT/ML injection Inject 80 Units into the skin at bedtime.     losartan (COZAAR) 100 MG tablet TAKE 1 Tablet BY MOUTH ONCE EVERY DAY 90 tablet 1   meloxicam (MOBIC) 15 MG tablet TAKE 1/2 Tablet BY MOUTH ONCE EVERY DAY (Patient taking differently: PRN) 15 tablet 5   metFORMIN (GLUCOPHAGE) 1000 MG tablet TAKE 1 Tablet  BY MOUTH TWICE DAILY WITH A MEAL 180 tablet 1   metoprolol tartrate (LOPRESSOR) 100 MG tablet TAKE 1 Tablet  BY MOUTH TWICE DAILY 180 tablet 1   pantoprazole (PROTONIX) 40 MG tablet Take 1 tablet (40 mg total) by mouth daily. 30 tablet 11   senna (SENOKOT) 8.6 MG tablet Take 2 tablets by mouth every other day.     No current facility-administered medications for this visit.    Allergies as of 06/26/2021 - Review Complete 06/26/2021  Allergen Reaction Noted   Amlodipine Swelling 10/19/2020   Januvia [sitagliptin] Other (See Comments) 05/21/2018    Family History  Problem Relation Age of Onset   Cancer Mother        ovarian   COPD Father    Diabetes Father    Hyperlipidemia Father    Heart attack Father    Heart failure Father    Colon cancer Neg Hx     Social History   Socioeconomic History   Marital status: Divorced    Spouse name: Not on file   Number of children: Not on file   Years of education: Not on file   Highest education level: Not on file  Occupational History   Not on file  Tobacco Use   Smoking status: Every Day    Packs/day: 0.25    Years: 20.00    Pack years: 5.00    Types: Cigarettes   Smokeless tobacco: Never  Vaping Use   Vaping Use: Never used  Substance and Sexual Activity   Alcohol use: No   Drug use: No   Sexual activity: Not on file  Other Topics Concern    Not on file  Social History Narrative   Separated x 20 yrs, one daughter.  She lives with her.  Great niece and nephew live with her as well.   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   Tob: 7 pack year hx, quit 2010.   Alc: none   Exercise: none   Social Determinants of Health   Financial Resource Strain: Not on file  Food Insecurity: Not on file  Transportation Needs: Not on file  Physical Activity: Not on file  Stress: Not on file  Social Connections: Not on file    Review of Systems: Gen: Denies fever, chills, cold or flulike symptoms, presyncope, syncope. CV: Denies chest pain, palpitations. Resp: Denies dyspnea or cough. GI: See HPI Heme: See HPI  Physical Exam: BP 121/66    Pulse 68    Temp (!) 97.3 F (36.3 C) (Temporal)    Ht 5\' 5"  (1.651 m)    Wt 215 lb 12.8 oz (97.9 kg)    BMI 35.91 kg/m  General:   Alert and oriented. No distress noted. Pleasant and cooperative.  Head:  Normocephalic and atraumatic. Eyes:  Conjuctiva clear without scleral icterus. Heart:  S1, S2 present without murmurs appreciated. Lungs:  Clear to auscultation bilaterally. No wheezes, rales, or rhonchi. No distress.  Abdomen:  +BS, soft, non-tender and non-distended. No rebound or guarding. No HSM or masses noted. Msk:  Symmetrical without gross deformities. Normal posture. Extremities:  Without edema. Neurologic:  Alert and  oriented x4 Psych: Normal mood and affect.   Assessment:  61 year old female history of GERD, chronic constipation, adenomatous colon polyps, due for surveillance colonoscopy in June 2023, presenting today for follow-up.  Constipation:  Not adequately managed with 2 Senokot every other day.  No alarm symptoms.  Previously failed Amitiza 24 mcg twice daily.  MiraLAX not helpful. Responded well to Linzess 145 mcg samples, but denied assistance through the health department in 2019.  As she has no insurance and all prescriptive agent's will be costly, I would like to try to  resubmit for patient assistance for Linzess as we are using a different program.  We will provide her with samples today to utilize in the meantime.  GERD:  Well-controlled on Protonix 40 mg daily.  No alarm symptoms.   Plan: Start Linzess 145 mcg daily 30 minutes before first meal.  Samples provided.  Requested nursing staff to help patient complete patient assistance paperwork. Continue Protonix 40 mg daily. Reinforced GERD diet/lifestyle.  Separate written instructions provided. Follow-up in 3-4 months.  We will discuss scheduling colonoscopy at that time.   [name], PA-C [facility] 06/26/2021

## 2021-06-26 ENCOUNTER — Encounter: Payer: Self-pay | Admitting: Gastroenterology

## 2021-06-26 ENCOUNTER — Ambulatory Visit (INDEPENDENT_AMBULATORY_CARE_PROVIDER_SITE_OTHER): Payer: Self-pay | Admitting: Gastroenterology

## 2021-06-26 ENCOUNTER — Other Ambulatory Visit: Payer: Self-pay

## 2021-06-26 VITALS — BP 121/66 | HR 68 | Temp 97.3°F | Ht 65.0 in | Wt 215.8 lb

## 2021-06-26 DIAGNOSIS — K5900 Constipation, unspecified: Secondary | ICD-10-CM

## 2021-06-26 DIAGNOSIS — K219 Gastro-esophageal reflux disease without esophagitis: Secondary | ICD-10-CM

## 2021-06-26 NOTE — Patient Instructions (Signed)
Start Linzess 145 mcg daily 30 minutes before first meal.  We are providing you with samples today.  Nursing staff will also be working with you to complete patient assistance forms for Linzess.  It is important that you have these forms completed ASAP.  Continue Protonix 40 mg daily 30 minutes before breakfast.  Follow a GERD diet/lifestyle:  Avoid fried, fatty, greasy, spicy, citrus foods. Avoid caffeine and carbonated beverages. Avoid chocolate. Try eating 4-6 small meals a day rather than 3 large meals. Do not eat within 3 hours of laying down. Prop head of bed up on wood or bricks to create a 6 inch incline.  We will follow-up with you in 3-4 months.  We will discuss scheduling her colonoscopy at that time.  It was good seeing you again today!  [name], PA-C [facility]

## 2021-07-28 ENCOUNTER — Other Ambulatory Visit: Payer: Self-pay | Admitting: Physician Assistant

## 2021-07-28 ENCOUNTER — Other Ambulatory Visit: Payer: Self-pay | Admitting: "Endocrinology

## 2021-07-28 DIAGNOSIS — E1165 Type 2 diabetes mellitus with hyperglycemia: Secondary | ICD-10-CM

## 2021-07-31 ENCOUNTER — Other Ambulatory Visit: Payer: Self-pay | Admitting: Physician Assistant

## 2021-07-31 DIAGNOSIS — E1165 Type 2 diabetes mellitus with hyperglycemia: Secondary | ICD-10-CM

## 2021-07-31 MED ORDER — ATORVASTATIN CALCIUM 20 MG PO TABS: ORAL_TABLET | ORAL | 1 refills | Status: DC

## 2021-07-31 MED ORDER — HYDROCHLOROTHIAZIDE 25 MG PO TABS: 12.5000 mg | ORAL_TABLET | Freq: Every day | ORAL | 1 refills | Status: DC

## 2021-07-31 MED ORDER — CLONIDINE HCL 0.1 MG PO TABS: 0.1000 mg | ORAL_TABLET | Freq: Two times a day (BID) | ORAL | 0 refills | Status: DC

## 2021-07-31 MED ORDER — LOSARTAN POTASSIUM 100 MG PO TABS: ORAL_TABLET | ORAL | 0 refills | Status: DC

## 2021-07-31 MED ORDER — METOPROLOL TARTRATE 100 MG PO TABS: 100.0000 mg | ORAL_TABLET | Freq: Two times a day (BID) | ORAL | 0 refills | Status: DC

## 2021-07-31 MED ORDER — HYDRALAZINE HCL 10 MG PO TABS: 10.0000 mg | ORAL_TABLET | Freq: Three times a day (TID) | ORAL | 0 refills | Status: DC

## 2021-07-31 MED ORDER — ALBUTEROL SULFATE HFA 108 (90 BASE) MCG/ACT IN AERS: INHALATION_SPRAY | RESPIRATORY_TRACT | 0 refills | Status: DC

## 2021-08-02 ENCOUNTER — Telehealth: Payer: Self-pay | Admitting: Internal Medicine

## 2021-08-02 NOTE — Telephone Encounter (Signed)
Gave to [place]-- Kristen's pt.  ?

## 2021-08-02 NOTE — Telephone Encounter (Signed)
Pt called to follow up on Linzess paperwork. Please call [phone] ?

## 2021-09-01 ENCOUNTER — Encounter (HOSPITAL_COMMUNITY)
Admission: RE | Admit: 2021-09-01 | Discharge: 2021-09-01 | Disposition: A | Discharge status: Home / Self Care | Payer: Self-pay | Source: Ambulatory Visit | Attending: Ophthalmology | Admitting: Ophthalmology

## 2021-09-01 ENCOUNTER — Encounter (HOSPITAL_COMMUNITY): Payer: Self-pay

## 2021-09-01 ENCOUNTER — Other Ambulatory Visit: Payer: Self-pay

## 2021-09-01 NOTE — Pre-Procedure Instructions (Signed)
Attempted pre-op phone call. Left message for her to call us back. ?

## 2021-09-04 ENCOUNTER — Other Ambulatory Visit: Payer: Self-pay | Admitting: Orthopedic Surgery

## 2021-09-04 DIAGNOSIS — G8929 Other chronic pain: Secondary | ICD-10-CM

## 2021-09-04 DIAGNOSIS — M7581 Other shoulder lesions, right shoulder: Secondary | ICD-10-CM

## 2021-09-04 NOTE — H&P (Signed)
Surgical History & Physical ? ?Patient Name: Casey Johnston DOB: 12-Jan-1961 ? ?Surgery: Cataract extraction with intraocular lens implant phacoemulsification; Right Eye ? ?Surgeon: Baruch Goldmann MD ?Surgery Date:  09-08-21 ?Pre-Op Date:  09-04-21 ? ?HPI: ?A 8 Yr. old female patient The patient is here for a cataract evaluation of both eyes, referred from Dr. Hassell Done. Since the last visit, the affected area is worsening. The patient's vision is blurry. The complaint is associated with difficulty reading small print on medicine bottles/labels, driving at night due to halos/glare, and difficulty recognizing people at a distance. Patient is not taking medications. This is negatively affecting the patient's quality of life and the patient is unable to function adequately in life with the current level of vision. HPI was performed by Baruch Goldmann . ? ?Medical History: ?Macula Degeneration ?Glaucoma ?Diabetes - DM Type 2 ?High Blood Pressure ? ?Review of Systems ?Negative Allergic/Immunologic ?Negative Cardiovascular ?Negative Constitutional ?Negative Ear, Nose, Mouth & Throat ?Negative Endocrine ?Negative Eyes ?Negative Gastrointestinal ?Negative Genitourinary ?Negative Hemotologic/Lymphatic ?Negative Integumentary ?Negative Musculoskeletal ?Negative Neurological ?Negative Psychiatry ?Negative Respiratory ? ?Social ?  Smoker, current status unknown  ? ?Medication ?Meloxicam, Hydrolazine, Losartan, Atorvastatin, Clonidine, Lantus, Metformin,  ? ?Sx/Procedures ?Hysterectomy,  ? ?Drug Allergies  ?JANUVIA,  ? ?History & Physical: ?Heent: Cataract, Right Eye ?NECK: supple without bruits ?LUNGS: lungs clear to auscultation ?CV: regular rate and rhythm ?Abdomen: soft and non-tender ? ?Impression & Plan: ?Assessment: ?1.  NUCLEAR SCLEROSIS AGE RELATED; Both Eyes (H25.13) ?2.  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  Diabetes Type 2 No retinopathy (E11.9) ?4.  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   ASTIGMATISM, REGULAR; Both Eyes (H52.223) ? ?Plan: 1.  Cataract accounts for the patient's decreased vision. This visual impairment is not correctable with a tolerable change in glasses or contact lenses. Cataract surgery with an implantation of a new lens should significantly improve the visual and functional status of the patient. Discussed all risks, benefits, alternatives, and potential complications. Discussed the procedures and recovery. Patient desires to have surgery. A-scan ordered and performed today for intra-ocular lens calculations. The surgery will be performed in order to improve vision for driving, reading, and for eye examinations. Recommend phacoemulsification with intra-ocular lens. Recommend Dextenza for post-operative pain and inflammation. ?Right Eye. ?Dilates well - shugarcaine by protocol. ? ?2.  Based on cup-to-disc ratio. ?Positive family history. ?African-American. ?OCT rNFL today shows: WNL OU. ?Detailed discussion about glaucoma today including importance of maintaining good follow up and following treatment plan, and the possibility of irreversible blindness as part of this disease process. ? ?3.  Stressed importance of blood sugar and blood pressure control, and also yearly eye examinations. ?Discussed the need for ongoing proactive ocular exams and treatment, hopefully before visual symptoms develop. ? ?4.  onset 2 years ago. ?Mild diplopia (intermittent). ?No loss of function on eye movements. ?May require strabismus surgery in future. ? ?5.  OS worse - toric lens OS only. ?

## 2021-09-08 ENCOUNTER — Encounter (HOSPITAL_COMMUNITY): Admission: RE | Payer: Self-pay | Source: Home / Self Care

## 2021-09-08 ENCOUNTER — Ambulatory Visit (HOSPITAL_COMMUNITY): Admission: RE | Admit: 2021-09-08 | Payer: Self-pay | Source: Home / Self Care | Admitting: Ophthalmology

## 2021-09-08 SURGERY — PHACOEMULSIFICATION, CATARACT, WITH IOL INSERTION
Anesthesia: Monitor Anesthesia Care | Laterality: Right

## 2021-09-25 ENCOUNTER — Encounter: Payer: Self-pay | Admitting: *Deleted

## 2021-09-27 ENCOUNTER — Other Ambulatory Visit: Payer: Self-pay | Admitting: Physician Assistant

## 2021-09-27 DIAGNOSIS — E1165 Type 2 diabetes mellitus with hyperglycemia: Secondary | ICD-10-CM

## 2021-09-27 DIAGNOSIS — I10 Essential (primary) hypertension: Secondary | ICD-10-CM

## 2021-09-27 DIAGNOSIS — E785 Hyperlipidemia, unspecified: Secondary | ICD-10-CM

## 2021-09-28 ENCOUNTER — Encounter: Payer: Self-pay | Admitting: "Endocrinology

## 2021-09-28 ENCOUNTER — Ambulatory Visit (INDEPENDENT_AMBULATORY_CARE_PROVIDER_SITE_OTHER): Payer: Self-pay | Admitting: "Endocrinology

## 2021-09-28 VITALS — BP 130/72 | HR 68 | Ht 65.0 in | Wt 221.2 lb

## 2021-09-28 DIAGNOSIS — I10 Essential (primary) hypertension: Secondary | ICD-10-CM

## 2021-09-28 DIAGNOSIS — E1165 Type 2 diabetes mellitus with hyperglycemia: Secondary | ICD-10-CM

## 2021-09-28 DIAGNOSIS — E782 Mixed hyperlipidemia: Secondary | ICD-10-CM

## 2021-09-28 LAB — POCT GLYCOSYLATED HEMOGLOBIN (HGB A1C): HbA1c, POC (controlled diabetic range): 8.4 % — AB (ref 0.0–7.0)

## 2021-09-28 MED ORDER — GLIPIZIDE 5 MG PO TABS: 5.0000 mg | ORAL_TABLET | Freq: Every day | ORAL | 1 refills | Status: DC

## 2021-09-28 MED ORDER — INSULIN GLARGINE 100 UNIT/ML ~~LOC~~ SOLN: 80.0000 [IU] | Freq: Every day | SUBCUTANEOUS | 2 refills | Status: DC

## 2021-09-28 MED ORDER — INSULIN GLULISINE 100 UNIT/ML SOLOSTAR PEN: 8.0000 [IU] | PEN_INJECTOR | Freq: Three times a day (TID) | SUBCUTANEOUS | 1 refills | Status: DC

## 2021-09-28 MED ORDER — METFORMIN HCL 1000 MG PO TABS
1000.0000 mg | ORAL_TABLET | Freq: Two times a day (BID) | ORAL | 1 refills | Status: DC
Start: 2021-09-28 — End: 2021-10-04

## 2021-09-28 NOTE — Patient Instructions (Signed)

## 2021-09-28 NOTE — Progress Notes (Signed)
? ?                                                         ?     09/28/2021, 4:22 PM ? ?Endocrinology follow-up note ? ? ?Subjective:  ? ? Patient ID: Casey Johnston, female    DOB: 08/03/1960.  ?Casey Johnston is being seen in follow-up after she was seen in consultation for management of currently uncontrolled type 2 diabetes, hyperlipidemia, hypertension. ?PCP: Soyla Dryer, PA-C. ? ? ?Past Medical History:  ?Diagnosis Date  ? Anxiety and depression   ? Diabetes mellitus, type II ([place])   ? Dr. Dorris Fetch managing  ? Frequent headaches chronic  ? ibuprofen helps some  ? History of UTI   ? Recurrent per pt (approx 3 per year)  ? Hyperlipidemia   ? Hypertension   ? Leukocytosis 12/2015  ? mild lymphocytosis.  Path smear review reassuring.  Repeat 01/2016 stable.  ? Morbid obesity ([place])   ? OSA on CPAP   ? not using CPAP anymore  ? ? ?Past Surgical History:  ?Procedure Laterality Date  ? ABDOMINAL HYSTERECTOMY  1998  ? Dysfunctional uterine bleeding.  No hx of abnormal paps.  ? BIOPSY  10/20/2018  ? Procedure: BIOPSY;  Surgeon: Danie Binder, MD;  [facility];  Service: Endoscopy;;  stomach  ? COLONOSCOPY  2012  ? Dr. Earley Brooke: 1.5 cm pendunculated descending colon polyp (tubulovillous adenoma) and diminutive hyperplastic polyps  ? COLONOSCOPY N/A 10/20/2018  ? Procedure: COLONOSCOPY;  Surgeon: Danie Binder, MD; 4 tubular adenomas, diverticulosis in the sigmoid colon, tortuous left colon, external and internal hemorrhoids. Due for repeat in 2023.   ? ESOPHAGOGASTRODUODENOSCOPY N/A 10/20/2018  ? Procedure: ESOPHAGOGASTRODUODENOSCOPY (EGD);  Surgeon: Danie Binder, MD;  gastritis and duodenitis. No H. Pylori.   ? POLYPECTOMY  10/20/2018  ? Procedure: POLYPECTOMY;  Surgeon: Danie Binder, MD;  [facility];  Service: Endoscopy;;  ? wart removal  08/04/2019  ? benign  ? ? ?Social History  ? ?Socioeconomic History  ? Marital status: Divorced  ?  Spouse name: Not on file  ?  Number of children: Not on file  ? Years of education: Not on file  ? Highest education level: Not on file  ?Occupational History  ? Not on file  ?Tobacco Use  ? Smoking status: Every Day  ?  Packs/day: 0.25  ?  Years: 20.00  ?  Pack years: 5.00  ?  Types: Cigarettes  ? Smokeless tobacco: Never  ?Vaping Use  ? Vaping Use: Never used  ?Substance and Sexual Activity  ? Alcohol use: No  ? Drug use: No  ? Sexual activity: Not on file  ?Other Topics Concern  ? Not on file  ?Social History Narrative  ? Separated x 20 yrs, one daughter.  She lives with her.  Great niece and nephew live with her as well.  ? 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  ? Tob: 7 pack year hx, quit 2010.  ? Alc: none  ? Exercise: none  ? ?Social Determinants of Health  ? ?Financial Resource Strain: Not on file  ?Food Insecurity: Not on file  ?Transportation Needs: Not on file  ?Physical Activity: Not on file  ?Stress: Not on file  ?Social Connections: Not on file  ? ? ?  Family History  ?Problem Relation Age of Onset  ? Cancer Mother   ?     ovarian  ? COPD Father   ? Diabetes Father   ? Hyperlipidemia Father   ? Heart attack Father   ? Heart failure Father   ? Colon cancer Neg Hx   ? ? ?Outpatient Encounter Medications as of 09/28/2021  ?Medication Sig  ? [DISCONTINUED] insulin glulisine (APIDRA) 100 UNIT/ML Solostar Pen Inject 8-14 Units into the skin 3 (three) times daily before meals.  ? albuterol (VENTOLIN HFA) 108 (90 Base) MCG/ACT inhaler INHALE 2 PUFFS BY MOUTH EVERY 6 HOURS AS NEEDED FOR COUGHING, WHEEZING, OR SHORTNESS OF BREATH  ? atorvastatin (LIPITOR) 20 MG tablet TAKE 1 Tablet BY MOUTH ONCE EVERY DAY  ? cetirizine (ZYRTEC) 10 MG tablet Take 10 mg by mouth daily.  ? cloNIDine (CATAPRES) 0.1 MG tablet Take 1 tablet (0.1 mg total) by mouth 2 (two) times daily.  ? gabapentin (NEURONTIN) 100 MG capsule Take 1 capsule (100 mg total) by mouth every 8 (eight) hours as needed.  ? glipiZIDE (GLUCOTROL) 5 MG tablet Take 1 tablet (5 mg total) by  mouth daily before breakfast.  ? hydrALAZINE (APRESOLINE) 10 MG tablet Take 1 tablet (10 mg total) by mouth 3 (three) times daily.  ? hydrochlorothiazide (HYDRODIURIL) 25 MG tablet Take 0.5 tablets (12.5 mg total) by mouth daily.  ? insulin glargine (LANTUS) 100 UNIT/ML injection Inject 0.8 mLs (80 Units total) into the skin at bedtime.  ? insulin glulisine (APIDRA) 100 UNIT/ML Solostar Pen Inject 8-14 Units into the skin 3 (three) times daily before meals.  ? losartan (COZAAR) 100 MG tablet TAKE 1 Tablet BY MOUTH ONCE EVERY DAY  ? meloxicam (MOBIC) 15 MG tablet Take 1 tablet (15 mg total) by mouth as needed. PRN  ? metFORMIN (GLUCOPHAGE) 1000 MG tablet Take 1 tablet (1,000 mg total) by mouth 2 (two) times daily with a meal.  ? metoprolol tartrate (LOPRESSOR) 100 MG tablet Take 1 tablet (100 mg total) by mouth 2 (two) times daily.  ? pantoprazole (PROTONIX) 40 MG tablet Take 1 tablet (40 mg total) by mouth daily.  ? senna (SENOKOT) 8.6 MG tablet Take 2 tablets by mouth every other day.  ? [DISCONTINUED] glipiZIDE (GLUCOTROL) 5 MG tablet Take 1 tablet (5 mg total) by mouth daily before breakfast.  ? [DISCONTINUED] Insulin Aspart (NOVOLOG FLEXPEN [place]) Inject 6-12 Units into the skin 3 (three) times daily before meals.  ? [DISCONTINUED] insulin glargine (LANTUS) 100 UNIT/ML injection Inject 80 Units into the skin at bedtime.  ? [DISCONTINUED] metFORMIN (GLUCOPHAGE) 1000 MG tablet TAKE 1 Tablet  BY MOUTH TWICE DAILY WITH A MEAL  ? ?No facility-administered encounter medications on file as of 09/28/2021.  ? ? ?ALLERGIES: ?Allergies  ?Allergen Reactions  ? Amlodipine Swelling  ? Januvia [Sitagliptin] Other (See Comments)  ?  Caused pancreatitis  ? ? ?VACCINATION STATUS: ?Immunization History  ?Administered Date(s) Administered  ? Tdap 02/17/2016  ? ? ?Diabetes ?She presents for her follow-up diabetic visit. She has type 2 diabetes mellitus. Onset time: She was diagnosed at approximate age of 84 years.  She was previously  seen in this clinic prior to 2018. Her disease course has been worsening. There are no hypoglycemic associated symptoms. Pertinent negatives for hypoglycemia include no confusion, headaches, pallor or seizures. Associated symptoms include fatigue, polydipsia and polyuria. Pertinent negatives for diabetes include no chest pain and no polyphagia. There are no hypoglycemic complications. Symptoms are worsening. (Noncompliance.) Risk factors  for coronary artery disease include diabetes mellitus, dyslipidemia, hypertension, tobacco exposure, post-menopausal, sedentary lifestyle, family history and obesity. Current diabetic treatment includes insulin injections. Her weight is increasing steadily. She is following a generally unhealthy diet. When asked about meal planning, she reported none. She has not had a previous visit with a dietitian. She never participates in exercise. Her home blood glucose trend is increasing steadily. Her breakfast blood glucose range is generally 180-200 mg/dl. Her lunch blood glucose range is generally 180-200 mg/dl. Her dinner blood glucose range is generally 180-200 mg/dl. Her bedtime blood glucose range is generally >200 mg/dl. Her overall blood glucose range is 180-200 mg/dl. (She presents with significantly higher glycemic profile and point-of-care A1c is 8.4%, increasing from 7.5%.  She did have several gaps due to lack of supplies.  She did not document any hypoglycemia.   ? ?) An ACE inhibitor/angiotensin II receptor blocker is being taken. Eye exam is current.  ?Hyperlipidemia ?This is a chronic problem. The current episode started more than 1 year ago. The problem is uncontrolled. Exacerbating diseases include diabetes and obesity. Pertinent negatives include no chest pain, myalgias or shortness of breath. Current antihyperlipidemic treatment includes statins. Risk factors for coronary artery disease include diabetes mellitus, dyslipidemia, hypertension, a sedentary lifestyle,  post-menopausal, obesity and family history.  ?Hypertension ?This is a chronic problem. The current episode started more than 1 year ago. The problem is controlled. Pertinent negatives include no chest pain, headache

## 2021-10-02 ENCOUNTER — Other Ambulatory Visit (HOSPITAL_COMMUNITY)
Admission: RE | Admit: 2021-10-02 | Discharge: 2021-10-02 | Disposition: A | Discharge status: Home / Self Care | Payer: Self-pay | Source: Ambulatory Visit | Attending: Physician Assistant | Admitting: Physician Assistant

## 2021-10-02 DIAGNOSIS — I10 Essential (primary) hypertension: Secondary | ICD-10-CM | POA: Insufficient documentation

## 2021-10-02 DIAGNOSIS — E1165 Type 2 diabetes mellitus with hyperglycemia: Secondary | ICD-10-CM | POA: Insufficient documentation

## 2021-10-02 DIAGNOSIS — E785 Hyperlipidemia, unspecified: Secondary | ICD-10-CM | POA: Insufficient documentation

## 2021-10-02 LAB — COMPREHENSIVE METABOLIC PANEL
ALT: 17 U/L (ref 0–44)
AST: 19 U/L (ref 15–41)
Albumin: 3.6 g/dL (ref 3.5–5.0)
Alkaline Phosphatase: 86 U/L (ref 38–126)
Anion gap: 5 (ref 5–15)
BUN: 16 mg/dL (ref 8–23)
CO2: 28 mmol/L (ref 22–32)
Calcium: 8.7 mg/dL — ABNORMAL LOW (ref 8.9–10.3)
Chloride: 106 mmol/L (ref 98–111)
Creatinine, Ser: 0.86 mg/dL (ref 0.44–1.00)
GFR, Estimated: >60 mL/min (ref >60)
Glucose, Bld: 227 mg/dL — ABNORMAL HIGH (ref 70–99)
Potassium: 4.3 mmol/L (ref 3.5–5.1)
Sodium: 139 mmol/L (ref 135–145)
Total Bilirubin: 0.5 mg/dL (ref 0.3–1.2)
Total Protein: 7.7 g/dL (ref 6.5–8.1)

## 2021-10-02 LAB — LIPID PANEL
Cholesterol: 135 mg/dL (ref 0–200)
HDL: 35 mg/dL — ABNORMAL LOW (ref >40)
LDL Cholesterol: 58 mg/dL (ref 0–99)
Total CHOL/HDL Ratio: 3.9 RATIO
Triglycerides: 211 mg/dL — ABNORMAL HIGH (ref <150)
VLDL: 42 mg/dL — ABNORMAL HIGH (ref 0–40)

## 2021-10-03 LAB — MICROALBUMIN, URINE: Microalb, Ur: 59.3 ug/mL — ABNORMAL HIGH

## 2021-10-04 ENCOUNTER — Encounter: Payer: Self-pay | Admitting: Physician Assistant

## 2021-10-04 ENCOUNTER — Ambulatory Visit: Payer: HRSA Program | Admitting: Physician Assistant

## 2021-10-04 VITALS — BP 154/80 | HR 82 | Temp 98.4°F | Wt 222.0 lb

## 2021-10-04 DIAGNOSIS — F17200 Nicotine dependence, unspecified, uncomplicated: Secondary | ICD-10-CM

## 2021-10-04 DIAGNOSIS — E1165 Type 2 diabetes mellitus with hyperglycemia: Secondary | ICD-10-CM

## 2021-10-04 DIAGNOSIS — Z5320 Procedure and treatment not carried out because of patient's decision for unspecified reasons: Secondary | ICD-10-CM

## 2021-10-04 DIAGNOSIS — I10 Essential (primary) hypertension: Secondary | ICD-10-CM

## 2021-10-04 DIAGNOSIS — E785 Hyperlipidemia, unspecified: Secondary | ICD-10-CM

## 2021-10-04 DIAGNOSIS — E669 Obesity, unspecified: Secondary | ICD-10-CM

## 2021-10-04 MED ORDER — ATORVASTATIN CALCIUM 20 MG PO TABS: ORAL_TABLET | ORAL | 0 refills | Status: DC

## 2021-10-04 MED ORDER — METOPROLOL TARTRATE 100 MG PO TABS: 100.0000 mg | ORAL_TABLET | Freq: Two times a day (BID) | ORAL | 0 refills | Status: DC

## 2021-10-04 MED ORDER — LOSARTAN POTASSIUM 100 MG PO TABS: ORAL_TABLET | ORAL | 0 refills | Status: DC

## 2021-10-04 MED ORDER — HYDROCHLOROTHIAZIDE 25 MG PO TABS: 12.5000 mg | ORAL_TABLET | Freq: Every day | ORAL | 0 refills | Status: DC

## 2021-10-04 MED ORDER — CLONIDINE HCL 0.1 MG PO TABS: 0.1000 mg | ORAL_TABLET | Freq: Two times a day (BID) | ORAL | 0 refills | Status: DC

## 2021-10-04 MED ORDER — METFORMIN HCL 1000 MG PO TABS: 1000.0000 mg | ORAL_TABLET | Freq: Two times a day (BID) | ORAL | 0 refills | Status: DC

## 2021-10-04 MED ORDER — HYDRALAZINE HCL 10 MG PO TABS
10.0000 mg | ORAL_TABLET | Freq: Three times a day (TID) | ORAL | 0 refills | Status: DC
Start: 2021-10-04 — End: 2021-10-05

## 2021-10-04 NOTE — Progress Notes (Signed)
BP (!) 154/80   Pulse 82   Temp 98.4 F (36.9 C)   Wt 222 lb (100.7 kg)   SpO2 94%   BMI 36.94 kg/m    Subjective:    Patient ID: Casey Johnston, female    DOB: 1960-05-25, 61 y.o.   MRN: [id number]  HPI: Casey Johnston is a 61 y.o. female presenting on 10/04/2021 for Hypertension and Hyperlipidemia   HPI   Chief Complaint  Patient presents with   Hypertension   Hyperlipidemia    She sees endocrinologist for DM.   Last seen there on 09/28/21.    She is still working in [employer].  She is feeling well.     She does have Stress but says she is not having  depression or anxiety  She has no complaints today.  She does say that a skin tag got pulled off her abdomen about a week ago.  She has been cleaning it with alcohol daily.     Relevant past medical, surgical, family and social history reviewed and updated as indicated. Interim medical history since our last visit reviewed. Allergies and medications reviewed and updated.   Current Outpatient Medications:    albuterol (VENTOLIN HFA) 108 (90 Base) MCG/ACT inhaler, INHALE 2 PUFFS BY MOUTH EVERY 6 HOURS AS NEEDED FOR COUGHING, WHEEZING, OR SHORTNESS OF BREATH, Disp: 20.1 g, Rfl: 0   atorvastatin (LIPITOR) 20 MG tablet, TAKE 1 Tablet BY MOUTH ONCE EVERY DAY, Disp: 90 tablet, Rfl: 1   cetirizine (ZYRTEC) 10 MG tablet, Take 10 mg by mouth daily., Disp: , Rfl:    cloNIDine (CATAPRES) 0.1 MG tablet, Take 1 tablet (0.1 mg total) by mouth 2 (two) times daily., Disp: 180 tablet, Rfl: 0   gabapentin (NEURONTIN) 100 MG capsule, Take 1 capsule (100 mg total) by mouth every 8 (eight) hours as needed., Disp: 90 capsule, Rfl: 1   glipiZIDE (GLUCOTROL) 5 MG tablet, Take 1 tablet (5 mg total) by mouth daily before breakfast., Disp: 90 tablet, Rfl: 1   hydrALAZINE (APRESOLINE) 10 MG tablet, Take 1 tablet (10 mg total) by mouth 3 (three) times daily., Disp: 270 tablet, Rfl: 0   hydrochlorothiazide (HYDRODIURIL) 25 MG tablet,  Take 0.5 tablets (12.5 mg total) by mouth daily., Disp: 45 tablet, Rfl: 1   insulin glargine (LANTUS) 100 UNIT/ML injection, Inject 0.8 mLs (80 Units total) into the skin at bedtime., Disp: 30 mL, Rfl: 2   insulin glulisine (APIDRA) 100 UNIT/ML Solostar Pen, Inject 8-14 Units into the skin 3 (three) times daily before meals., Disp: 30 mL, Rfl: 1   losartan (COZAAR) 100 MG tablet, TAKE 1 Tablet BY MOUTH ONCE EVERY DAY, Disp: 90 tablet, Rfl: 0   meloxicam (MOBIC) 15 MG tablet, Take 1 tablet (15 mg total) by mouth as needed. PRN, Disp: 60 tablet, Rfl: 5   metFORMIN (GLUCOPHAGE) 1000 MG tablet, Take 1 tablet (1,000 mg total) by mouth 2 (two) times daily with a meal., Disp: 180 tablet, Rfl: 1   metoprolol tartrate (LOPRESSOR) 100 MG tablet, Take 1 tablet (100 mg total) by mouth 2 (two) times daily., Disp: 180 tablet, Rfl: 0   pantoprazole (PROTONIX) 40 MG tablet, Take 1 tablet (40 mg total) by mouth daily., Disp: 30 tablet, Rfl: 11   senna (SENOKOT) 8.6 MG tablet, Take 2 tablets by mouth every other day., Disp: , Rfl:     Review of Systems  Per HPI unless specifically indicated above     Objective:  BP (!) 154/80   Pulse 82   Temp 98.4 F (36.9 C)   Wt 222 lb (100.7 kg)   SpO2 94%   BMI 36.94 kg/m   Wt Readings from Last 3 Encounters:  10/04/21 222 lb (100.7 kg)  09/28/21 221 lb 3.2 oz (100.3 kg)  06/26/21 215 lb 12.8 oz (97.9 kg)    Physical Exam Vitals reviewed.  Constitutional:      General: She is not in acute distress.    Appearance: She is well-developed. She is obese. She is not toxic-appearing.  HENT:     Head: Normocephalic and atraumatic.  Cardiovascular:     Rate and Rhythm: Normal rate and regular rhythm.  Pulmonary:     Effort: Pulmonary effort is normal.     Breath sounds: Normal breath sounds.  Abdominal:     General: Bowel sounds are normal.     Palpations: Abdomen is soft. There is no mass.     Tenderness: There is no abdominal tenderness.   Musculoskeletal:     Cervical back: Neck supple.     Right lower leg: No edema.     Left lower leg: No edema.  Lymphadenopathy:     Cervical: No cervical adenopathy.  Skin:    General: Skin is warm and dry.     Comments: Small wound on abdomen (where skin tag came off).  No redness or purulence or signs infection  Neurological:     Mental Status: She is alert and oriented to person, place, and time.  Psychiatric:        Behavior: Behavior normal.      Results for orders placed or performed during the hospital encounter of 10/02/21  Microalbumin, urine  Result Value Ref Range   Microalb, Ur 59.3 (H) Not Estab. ug/mL  Lipid panel  Result Value Ref Range   Cholesterol 135 0 - 200 mg/dL   Triglycerides 211 (H) <150 mg/dL   HDL 35 (L) >40 mg/dL   Total CHOL/HDL Ratio 3.9 RATIO   VLDL 42 (H) 0 - 40 mg/dL   LDL Cholesterol 58 0 - 99 mg/dL  Comprehensive metabolic panel  Result Value Ref Range   Sodium 139 135 - 145 mmol/L   Potassium 4.3 3.5 - 5.1 mmol/L   Chloride 106 98 - 111 mmol/L   CO2 28 22 - 32 mmol/L   Glucose, Bld 227 (H) 70 - 99 mg/dL   BUN 16 8 - 23 mg/dL   Creatinine, Ser 0.86 0.44 - 1.00 mg/dL   Calcium 8.7 (L) 8.9 - 10.3 mg/dL   Total Protein 7.7 6.5 - 8.1 g/dL   Albumin 3.6 3.5 - 5.0 g/dL   AST 19 15 - 41 U/L   ALT 17 0 - 44 U/L   Alkaline Phosphatase 86 38 - 126 U/L   Total Bilirubin 0.5 0.3 - 1.2 mg/dL   GFR, Estimated >60 >60 mL/min   Anion gap 5 5 - 15      Assessment & Plan:   Encounter Diagnoses  Name Primary?   Essential hypertension, benign Yes   Hyperlipidemia, unspecified hyperlipidemia type    Uncontrolled type 2 diabetes mellitus with hyperglycemia (HCC)    Tobacco use disorder    Obesity, unspecified classification, unspecified obesity type, unspecified whether serious comorbidity present    Screening mammography declined      -Reviewed labs with pt -She declines screening mammogram -Other HCM UTD -pt Educated and encouraged to  get covid vaccination -Pt to monitor bp at  home.  She is to notify office if it starts running > 140.  Discussed bp.  It has been well controlled.  She thinks it is high today because she was rushing to get to her appointment -Counseled wound care.  She is to notify office for any signs infection At appointment end, it was found out that pt has  [employer].  Discussed she is no longer eligible for [facility] and encouraged her to get new PCP

## 2021-10-05 MED ORDER — ATORVASTATIN CALCIUM 20 MG PO TABS: ORAL_TABLET | ORAL | 0 refills | Status: AC

## 2021-10-05 MED ORDER — HYDRALAZINE HCL 10 MG PO TABS: 10.0000 mg | ORAL_TABLET | Freq: Three times a day (TID) | ORAL | 0 refills | Status: DC

## 2021-10-05 MED ORDER — METFORMIN HCL 1000 MG PO TABS: 1000.0000 mg | ORAL_TABLET | Freq: Two times a day (BID) | ORAL | 0 refills | Status: AC

## 2021-10-05 MED ORDER — CLONIDINE HCL 0.1 MG PO TABS: 0.1000 mg | ORAL_TABLET | Freq: Two times a day (BID) | ORAL | 0 refills | Status: DC

## 2021-10-05 MED ORDER — HYDROCHLOROTHIAZIDE 25 MG PO TABS: 12.5000 mg | ORAL_TABLET | Freq: Every day | ORAL | 0 refills | Status: DC

## 2021-10-05 MED ORDER — METOPROLOL TARTRATE 100 MG PO TABS: 100.0000 mg | ORAL_TABLET | Freq: Two times a day (BID) | ORAL | 0 refills | Status: DC

## 2021-10-17 NOTE — Progress Notes (Unsigned)
Referring Provider: Soyla Dryer, PA-C Primary Care Physician:  No primary care provider on file. Primary GI Physician: Dr. Abbey Chatters  No chief complaint on file.   HPI:   Casey Johnston is a 61 y.o. female with history of GERD, constipation, chronic abdominal pain that seems to be associated with underlying constipation, likely IBS-C.  Previously failed Amitiza 24 mcg twice daily and MiraLAX daily.  Linzess 145 mcg daily had worked well, but too expensive.  TCS and EGD in June 2020. TCS with 4 tubular adenomas, diverticulosis in the sigmoid colon, tortuous left colon, external and internal hemorrhoids. Due for surveillance in 2023.  EGD with gastritis and duodenitis. No H. Pylori. Also with history of 1.2 cm subcutaneous nodules in the right lower anterior abdominal wall noted on CT in May 2020 s/p biopsy revealing benign epidermoid cyst.  She is presenting today for follow-up. ***  Last seen in our office February 2023.  GERD was well controlled on Protonix 40 mg daily.  Constipation not adequately managed on 2 Senokot every other day.  Planned to resubmit for patient assistance for Linzess 145 mcg.  Samples also provided.  Plan follow-up in 3 to 4 months to discuss scheduling colonoscopy.  Today:    Past Medical History:  Diagnosis Date   Anxiety and depression    Diabetes mellitus, type II ([place])    Dr. Dorris Fetch managing   Frequent headaches chronic   ibuprofen helps some   History of UTI    Recurrent per pt (approx 3 per year)   Hyperlipidemia    Hypertension    Leukocytosis 12/2015   mild lymphocytosis.  Path smear review reassuring.  Repeat 01/2016 stable.   Morbid obesity ([facility])    OSA on CPAP    not using CPAP anymore    Past Surgical History:  Procedure Laterality Date   ABDOMINAL HYSTERECTOMY  1998   Dysfunctional uterine bleeding.  No hx of abnormal paps.   BIOPSY  10/20/2018   Procedure: BIOPSY;  Surgeon: Danie Binder, MD;  [facility];  Service:  Endoscopy;;  stomach   COLONOSCOPY  2012   Dr. Earley Brooke: 1.5 cm pendunculated descending colon polyp (tubulovillous adenoma) and diminutive hyperplastic polyps   COLONOSCOPY N/A 10/20/2018   Procedure: COLONOSCOPY;  Surgeon: Danie Binder, MD; 4 tubular adenomas, diverticulosis in the sigmoid colon, tortuous left colon, external and internal hemorrhoids. Due for repeat in 2023.    ESOPHAGOGASTRODUODENOSCOPY N/A 10/20/2018   Procedure: ESOPHAGOGASTRODUODENOSCOPY (EGD);  Surgeon: Danie Binder, MD;  gastritis and duodenitis. No H. Pylori.    POLYPECTOMY  10/20/2018   Procedure: POLYPECTOMY;  Surgeon: Danie Binder, MD;  [facility];  Service: Endoscopy;;   wart removal  08/04/2019   benign    Current Outpatient Medications  Medication Sig Dispense Refill   albuterol (VENTOLIN HFA) 108 (90 Base) MCG/ACT inhaler INHALE 2 PUFFS BY MOUTH EVERY 6 HOURS AS NEEDED FOR COUGHING, WHEEZING, OR SHORTNESS OF BREATH 20.1 g 0   atorvastatin (LIPITOR) 20 MG tablet TAKE 1 Tablet BY MOUTH ONCE EVERY DAY 30 tablet 0   cetirizine (ZYRTEC) 10 MG tablet Take 10 mg by mouth daily.     cloNIDine (CATAPRES) 0.1 MG tablet Take 1 tablet (0.1 mg total) by mouth 2 (two) times daily. 60 tablet 0   gabapentin (NEURONTIN) 100 MG capsule Take 1 capsule (100 mg total) by mouth every 8 (eight) hours as needed. 90 capsule 1   glipiZIDE (GLUCOTROL) 5 MG  tablet Take 1 tablet (5 mg total) by mouth daily before breakfast. 90 tablet 1   hydrALAZINE (APRESOLINE) 10 MG tablet Take 1 tablet (10 mg total) by mouth 3 (three) times daily. 90 tablet 0   hydrochlorothiazide (HYDRODIURIL) 25 MG tablet Take 0.5 tablets (12.5 mg total) by mouth daily. 15 tablet 0   insulin glargine (LANTUS) 100 UNIT/ML injection Inject 0.8 mLs (80 Units total) into the skin at bedtime. 30 mL 2   insulin glulisine (APIDRA) 100 UNIT/ML Solostar Pen Inject 8-14 Units into the skin 3 (three) times daily before meals. 30 mL 1   losartan (COZAAR) 100 MG  tablet TAKE 1 Tablet BY MOUTH ONCE EVERY DAY 90 tablet 0   meloxicam (MOBIC) 15 MG tablet Take 1 tablet (15 mg total) by mouth as needed. PRN 60 tablet 5   metFORMIN (GLUCOPHAGE) 1000 MG tablet Take 1 tablet (1,000 mg total) by mouth 2 (two) times daily with a meal. 60 tablet 0   metoprolol tartrate (LOPRESSOR) 100 MG tablet Take 1 tablet (100 mg total) by mouth 2 (two) times daily. 60 tablet 0   pantoprazole (PROTONIX) 40 MG tablet Take 1 tablet (40 mg total) by mouth daily. 30 tablet 11   senna (SENOKOT) 8.6 MG tablet Take 2 tablets by mouth every other day.     No current facility-administered medications for this visit.    Allergies as of 10/19/2021 - Review Complete 10/04/2021  Allergen Reaction Noted   Amlodipine Swelling 10/19/2020   Januvia [sitagliptin] Other (See Comments) 05/21/2018    Family History  Problem Relation Age of Onset   Cancer Mother        ovarian   COPD Father    Diabetes Father    Hyperlipidemia Father    Heart attack Father    Heart failure Father    Colon cancer Neg Hx     Social History   Socioeconomic History   Marital status: Divorced    Spouse name: Not on file   Number of children: Not on file   Years of education: Not on file   Highest education level: Not on file  Occupational History   Not on file  Tobacco Use   Smoking status: Every Day    Packs/day: 0.25    Years: 20.00    Pack years: 5.00    Types: Cigarettes   Smokeless tobacco: Never  Vaping Use   Vaping Use: Never used  Substance and Sexual Activity   Alcohol use: No   Drug use: No   Sexual activity: Not on file  Other Topics Concern   Not on file  Social History Narrative   Separated x 20 yrs, one daughter.  She lives with her.  Great niece and nephew live with her as well.   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   Tob: 7 pack year hx, quit 2010.   Alc: none   Exercise: none   Social Determinants of Health   Financial Resource Strain: Not on file  Food  Insecurity: Not on file  Transportation Needs: Not on file  Physical Activity: Not on file  Stress: Not on file  Social Connections: Not on file    Review of Systems: Gen: Denies fever, chills, anorexia. Denies fatigue, weakness, weight loss.  CV: Denies chest pain, palpitations, syncope, peripheral edema, and claudication. Resp: Denies dyspnea at rest, cough, wheezing, coughing up blood, and pleurisy. GI: Denies vomiting blood, jaundice, and fecal incontinence.   Denies dysphagia or odynophagia. Derm: Denies rash,  itching, dry skin Psych: Denies depression, anxiety, memory loss, confusion. No homicidal or suicidal ideation.  Heme: Denies bruising, bleeding, and enlarged lymph nodes.  Physical Exam: There were no vitals taken for this visit. General:   Alert and oriented. No distress noted. Pleasant and cooperative.  Head:  Normocephalic and atraumatic. Eyes:  Conjuctiva clear without scleral icterus. Heart:  S1, S2 present without murmurs appreciated. Lungs:  Clear to auscultation bilaterally. No wheezes, rales, or rhonchi. No distress.  Abdomen:  +BS, soft, non-tender and non-distended. No rebound or guarding. No HSM or masses noted. Msk:  Symmetrical without gross deformities. Normal posture. Extremities:  Without edema. Neurologic:  Alert and  oriented x4 Psych:  Normal mood and affect.    Assessment:     Plan:  ***   [name], PA-C [facility] 10/19/2021

## 2021-10-19 ENCOUNTER — Ambulatory Visit (INDEPENDENT_AMBULATORY_CARE_PROVIDER_SITE_OTHER): Payer: Self-pay | Admitting: Gastroenterology

## 2021-10-19 ENCOUNTER — Encounter: Payer: Self-pay | Admitting: Gastroenterology

## 2021-10-19 ENCOUNTER — Encounter: Payer: Self-pay | Admitting: Internal Medicine

## 2021-10-19 ENCOUNTER — Ambulatory Visit: Payer: Self-pay | Admitting: Gastroenterology

## 2021-10-19 VITALS — BP 140/72 | HR 84 | Temp 97.6°F | Ht 65.0 in | Wt 221.8 lb

## 2021-10-19 DIAGNOSIS — Z86010 Personal history of colonic polyps: Secondary | ICD-10-CM

## 2021-10-19 DIAGNOSIS — K219 Gastro-esophageal reflux disease without esophagitis: Secondary | ICD-10-CM

## 2021-10-19 DIAGNOSIS — K5900 Constipation, unspecified: Secondary | ICD-10-CM

## 2021-10-19 MED ORDER — PANTOPRAZOLE SODIUM 40 MG PO TBEC: 40.0000 mg | DELAYED_RELEASE_TABLET | Freq: Every day | ORAL | 11 refills | Status: DC

## 2021-10-19 NOTE — Patient Instructions (Signed)
For constipation: We will try you on Linzess 72 mcg daily.  Take this first thing in the morning at least 30 minutes before you eat your first meal.  Please call next week with a progress report.  As we discussed, Linzess is ideally a daily medication to keep your bowels moving daily.  If this works well, we will resubmit for patient assistance. When you run out of samples and are waiting on approval of Linzess, you can increase your Senokot to 2 tablets daily rather than every other day.  For reflux: Continue Protonix 40 mg daily 30 minutes before breakfast.  I have sent refills to your pharmacy.  You are due for surveillance colonoscopy at this time.  Due to your elevated blood sugars, we will hold off on scheduling this for now and plan to follow-up in September after you have follow-up with your endocrinologist, Dr. Dorris Fetch.   It was good to see you again today!  [name], PA-C [facility]

## 2021-10-31 ENCOUNTER — Other Ambulatory Visit: Payer: Self-pay | Admitting: Physician Assistant

## 2021-11-27 ENCOUNTER — Telehealth: Payer: Self-pay | Admitting: *Deleted

## 2021-11-27 NOTE — Telephone Encounter (Signed)
Spoke to representative at [employer] and they informed me that they needed a new copy of her insurance card. I have faxed a copy to them.

## 2021-11-27 NOTE — Telephone Encounter (Signed)
Noted  

## 2021-12-04 ENCOUNTER — Telehealth: Payer: Self-pay | Admitting: *Deleted

## 2021-12-04 NOTE — Telephone Encounter (Signed)
Received approval letter for Linzess 10mg. Informed pt of approval. Placed copy on providers desk.

## 2021-12-04 NOTE — Telephone Encounter (Signed)
Noted  

## 2022-01-31 ENCOUNTER — Ambulatory Visit: Payer: Self-pay | Admitting: "Endocrinology

## 2022-02-08 ENCOUNTER — Ambulatory Visit: Payer: Self-pay | Admitting: Gastroenterology

## 2022-03-06 NOTE — Progress Notes (Unsigned)
Referring Provider: No ref. provider found Primary Care Physician:  No primary care provider on file. Primary GI Physician: Dr. Abbey Chatters  No chief complaint on file.   HPI:   Casey Johnston is a 61 y.o. female with history of GERD, constipation, chronic abdominal pain that seems to be associated with underlying constipation, likely IBS-C.  Previously failed Amitiza 24 mcg twice daily and MiraLAX daily.  Linzess 145 mcg daily had worked well, but too expensive.  TCS and EGD in June 2020. TCS with 4 tubular adenomas, diverticulosis in the sigmoid colon, tortuous left colon, external and internal hemorrhoids, currently overdue for surveillance colonoscopy. EGD with gastritis and duodenitis. No H. Pylori. Also with history of 1.2 cm subcutaneous nodule in the right lower anterior abdominal wall noted on CT in May 2020 s/p biopsy revealing benign epidermoid cyst.  She is presenting today for follow-up and to discuss scheduling colonoscopy.   Last seen in our office 10/19/2021.  GERD was well controlled on pantoprazole 40 mg daily.  No alarm symptoms.  She was using 2 Senokot every other day for constipation.  Bowels moving every 3 days.  Mild LLQ abdominal discomfort intermittently related to constipation.  Reported she completed patient assistance for Linzess, but did not hear anything back.  Also stated if taking Linzess 145 mcg every day, it would be too strong.  Denied BRBPR or melena.  Discussed need for colonoscopy, but patient requested to hold off until her blood sugars were under better control as she was usually running 260+.  Recommended starting Linzess 72 mcg daily.  Samples provided.  Requested progress report in 1 week and would submit patient assistance for this if it worked well.  Otherwise, continue current medications.  We ultimately received approval for Linzess 72 mcg daily.  Today:    Past Medical History:  Diagnosis Date   Anxiety and depression    Diabetes mellitus, type  II ([place])    Dr. Dorris Fetch managing   Frequent headaches chronic   ibuprofen helps some   History of UTI    Recurrent per pt (approx 3 per year)   Hyperlipidemia    Hypertension    Leukocytosis 12/2015   mild lymphocytosis.  Path smear review reassuring.  Repeat 01/2016 stable.   Morbid obesity ([facility])    OSA on CPAP    not using CPAP anymore    Past Surgical History:  Procedure Laterality Date   ABDOMINAL HYSTERECTOMY  1998   Dysfunctional uterine bleeding.  No hx of abnormal paps.   BIOPSY  10/20/2018   Procedure: BIOPSY;  Surgeon: Danie Binder, MD;  Location: [facility];  Service: Endoscopy;;  stomach   COLONOSCOPY  2012   Dr. Earley Brooke: 1.5 cm pendunculated descending colon polyp (tubulovillous adenoma) and diminutive hyperplastic polyps   COLONOSCOPY N/A 10/20/2018   Procedure: COLONOSCOPY;  Surgeon: Danie Binder, MD; 4 tubular adenomas, diverticulosis in the sigmoid colon, tortuous left colon, external and internal hemorrhoids. Due for repeat in 2023.    ESOPHAGOGASTRODUODENOSCOPY N/A 10/20/2018   Procedure: ESOPHAGOGASTRODUODENOSCOPY (EGD);  Surgeon: Danie Binder, MD;  gastritis and duodenitis. No H. Pylori.    POLYPECTOMY  10/20/2018   Procedure: POLYPECTOMY;  Surgeon: Danie Binder, MD;  Location: [facility];  Service: Endoscopy;;   wart removal  08/04/2019   benign    Current Outpatient Medications  Medication Sig Dispense Refill   albuterol (VENTOLIN HFA) 108 (90 Base) MCG/ACT inhaler INHALE 2 PUFFS BY MOUTH EVERY 6  HOURS AS NEEDED FOR COUGHING, WHEEZING, OR SHORTNESS OF BREATH 20.1 g 0   atorvastatin (LIPITOR) 20 MG tablet TAKE 1 Tablet BY MOUTH ONCE EVERY DAY 30 tablet 0   cetirizine (ZYRTEC) 10 MG tablet Take 10 mg by mouth daily.     cloNIDine (CATAPRES) 0.1 MG tablet Take 1 tablet (0.1 mg total) by mouth 2 (two) times daily. 60 tablet 0   gabapentin (NEURONTIN) 100 MG capsule Take 1 capsule (100 mg total) by mouth every 8 (eight) hours as needed. 90 capsule 1    glipiZIDE (GLUCOTROL) 5 MG tablet Take 1 tablet (5 mg total) by mouth daily before breakfast. 90 tablet 1   hydrALAZINE (APRESOLINE) 10 MG tablet Take 1 tablet (10 mg total) by mouth 3 (three) times daily. 90 tablet 0   hydrochlorothiazide (HYDRODIURIL) 25 MG tablet Take 0.5 tablets (12.5 mg total) by mouth daily. 15 tablet 0   insulin glargine (LANTUS) 100 UNIT/ML injection Inject 0.8 mLs (80 Units total) into the skin at bedtime. 30 mL 2   insulin glulisine (APIDRA) 100 UNIT/ML Solostar Pen Inject 8-14 Units into the skin 3 (three) times daily before meals. 30 mL 1   losartan (COZAAR) 100 MG tablet TAKE 1 Tablet BY MOUTH ONCE EVERY DAY 90 tablet 0   meloxicam (MOBIC) 15 MG tablet Take 1 tablet (15 mg total) by mouth as needed. PRN 60 tablet 5   metFORMIN (GLUCOPHAGE) 1000 MG tablet Take 1 tablet (1,000 mg total) by mouth 2 (two) times daily with a meal. 60 tablet 0   metoprolol tartrate (LOPRESSOR) 100 MG tablet Take 1 tablet (100 mg total) by mouth 2 (two) times daily. 60 tablet 0   pantoprazole (PROTONIX) 40 MG tablet Take 1 tablet (40 mg total) by mouth daily before breakfast. 30 tablet 11   senna (SENOKOT) 8.6 MG tablet Take 2 tablets by mouth every other day.     No current facility-administered medications for this visit.    Allergies as of 03/08/2022 - Review Complete 10/19/2021  Allergen Reaction Noted   Amlodipine Swelling 10/19/2020   Januvia [sitagliptin] Other (See Comments) 05/21/2018    Family History  Problem Relation Age of Onset   Cancer Mother        ovarian   COPD Father    Diabetes Father    Hyperlipidemia Father    Heart attack Father    Heart failure Father    Colon cancer Neg Hx     Social History   Socioeconomic History   Marital status: Divorced    Spouse name: Not on file   Number of children: Not on file   Years of education: Not on file   Highest education level: Not on file  Occupational History   Not on file  Tobacco Use   Smoking status:  Every Day    Packs/day: 0.25    Years: 20.00    Total pack years: 5.00    Types: Cigarettes   Smokeless tobacco: Never  Vaping Use   Vaping Use: Never used  Substance and Sexual Activity   Alcohol use: No   Drug use: No   Sexual activity: Not on file  Other Topics Concern   Not on file  Social History Narrative   Separated x 20 yrs, one daughter.  She lives with her.  Great niece and nephew live with her as well.   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   Tob: 7 pack year hx, quit 2010.   Alc: none  Exercise: none   Social Determinants of Health   Financial Resource Strain: Not on file  Food Insecurity: Not on file  Transportation Needs: Not on file  Physical Activity: Not on file  Stress: Not on file  Social Connections: Not on file    Review of Systems: Gen: Denies fever, chills, cold or flulike symptoms, presyncope, syncope. CV: Denies chest pain, palpitations. Resp: Denies dyspnea, cough.  GI: See HPI Heme: See HPI  Physical Exam: There were no vitals taken for this visit. General:   Alert and oriented. No distress noted. Pleasant and cooperative.  Head:  Normocephalic and atraumatic. Eyes:  Conjuctiva clear without scleral icterus. Heart:  S1, S2 present without murmurs appreciated. Lungs:  Clear to auscultation bilaterally. No wheezes, rales, or rhonchi. No distress.  Abdomen:  +BS, soft, non-tender and non-distended. No rebound or guarding. No HSM or masses noted. Msk:  Symmetrical without gross deformities. Normal posture. Extremities:  Without edema. Neurologic:  Alert and  oriented x4 Psych:  Normal mood and affect.    Assessment:     Plan:  ***   [name], PA-C [facility] 03/08/2022

## 2022-03-08 ENCOUNTER — Other Ambulatory Visit (HOSPITAL_COMMUNITY)
Admission: RE | Admit: 2022-03-08 | Discharge: 2022-03-08 | Disposition: A | Discharge status: Home / Self Care | Payer: Self-pay | Source: Ambulatory Visit | Attending: "Endocrinology | Admitting: "Endocrinology

## 2022-03-08 ENCOUNTER — Ambulatory Visit (INDEPENDENT_AMBULATORY_CARE_PROVIDER_SITE_OTHER): Payer: Self-pay | Admitting: Gastroenterology

## 2022-03-08 ENCOUNTER — Encounter: Payer: Self-pay | Admitting: Gastroenterology

## 2022-03-08 VITALS — BP 180/72 | HR 89 | Temp 97.7°F | Ht 65.0 in | Wt 230.6 lb

## 2022-03-08 DIAGNOSIS — Z86010 Personal history of colonic polyps: Secondary | ICD-10-CM

## 2022-03-08 DIAGNOSIS — E1165 Type 2 diabetes mellitus with hyperglycemia: Secondary | ICD-10-CM | POA: Insufficient documentation

## 2022-03-08 DIAGNOSIS — K5909 Other constipation: Secondary | ICD-10-CM

## 2022-03-08 DIAGNOSIS — E785 Hyperlipidemia, unspecified: Secondary | ICD-10-CM | POA: Insufficient documentation

## 2022-03-08 DIAGNOSIS — I10 Essential (primary) hypertension: Secondary | ICD-10-CM | POA: Insufficient documentation

## 2022-03-08 DIAGNOSIS — K219 Gastro-esophageal reflux disease without esophagitis: Secondary | ICD-10-CM

## 2022-03-08 LAB — COMPREHENSIVE METABOLIC PANEL
ALT: 18 U/L (ref 0–44)
AST: 24 U/L (ref 15–41)
Albumin: 3.4 g/dL — ABNORMAL LOW (ref 3.5–5.0)
Alkaline Phosphatase: 81 U/L (ref 38–126)
Anion gap: 9 (ref 5–15)
BUN: 11 mg/dL (ref 8–23)
CO2: 26 mmol/L (ref 22–32)
Calcium: 9.1 mg/dL (ref 8.9–10.3)
Chloride: 106 mmol/L (ref 98–111)
Creatinine, Ser: 0.7 mg/dL (ref 0.44–1.00)
GFR, Estimated: >60 mL/min (ref >60)
Glucose, Bld: 126 mg/dL — ABNORMAL HIGH (ref 70–99)
Potassium: 3.9 mmol/L (ref 3.5–5.1)
Sodium: 141 mmol/L (ref 135–145)
Total Bilirubin: 0.6 mg/dL (ref 0.3–1.2)
Total Protein: 7.5 g/dL (ref 6.5–8.1)

## 2022-03-08 LAB — LIPID PANEL
Cholesterol: 231 mg/dL — ABNORMAL HIGH (ref 0–200)
HDL: 44 mg/dL (ref >40)
LDL Cholesterol: 151 mg/dL — ABNORMAL HIGH (ref 0–99)
Total CHOL/HDL Ratio: 5.3 RATIO
Triglycerides: 179 mg/dL — ABNORMAL HIGH (ref <150)
VLDL: 36 mg/dL (ref 0–40)

## 2022-03-08 NOTE — Patient Instructions (Signed)
Continue pantoprazole 40 mg daily 30 minutes before breakfast.   Continue Linzess 72 mcg daily first thing in the morning 30 minutes before your first meal.  Call when you are ready to schedule your colonoscopy.  We will plan to follow-up with you in the office in 6 months or sooner if needed.  It was great to see you again today!  [name], PA-C [facility]

## 2022-03-09 ENCOUNTER — Ambulatory Visit: Payer: Self-pay | Admitting: "Endocrinology

## 2022-03-27 ENCOUNTER — Encounter: Payer: Self-pay | Admitting: "Endocrinology

## 2022-03-27 ENCOUNTER — Ambulatory Visit (INDEPENDENT_AMBULATORY_CARE_PROVIDER_SITE_OTHER): Payer: Self-pay | Admitting: "Endocrinology

## 2022-03-27 VITALS — BP 130/72 | HR 60 | Ht 65.0 in | Wt 223.8 lb

## 2022-03-27 DIAGNOSIS — F17200 Nicotine dependence, unspecified, uncomplicated: Secondary | ICD-10-CM

## 2022-03-27 DIAGNOSIS — I10 Essential (primary) hypertension: Secondary | ICD-10-CM

## 2022-03-27 DIAGNOSIS — E782 Mixed hyperlipidemia: Secondary | ICD-10-CM

## 2022-03-27 DIAGNOSIS — Z91199 Patient's noncompliance with other medical treatment and regimen due to unspecified reason: Secondary | ICD-10-CM

## 2022-03-27 DIAGNOSIS — E1165 Type 2 diabetes mellitus with hyperglycemia: Secondary | ICD-10-CM

## 2022-03-27 LAB — POCT GLYCOSYLATED HEMOGLOBIN (HGB A1C): HbA1c, POC (controlled diabetic range): 10.4 % — AB (ref 0.0–7.0)

## 2022-03-27 LAB — POCT UA - MICROALBUMIN
Creatinine, POC: 300 mg/dL
Microalbumin Ur, POC: 150 mg/L

## 2022-03-27 NOTE — Progress Notes (Signed)
03/27/2022, 4:39 PM  Endocrinology follow-up note   Subjective:    Patient ID: Casey Johnston, female    DOB: 1961/04/13.  Casey Johnston is being seen in follow-up after she was seen in consultation for management of currently uncontrolled type 2 diabetes, hyperlipidemia, hypertension. PCP: Pcp, No.   Past Medical History:  Diagnosis Date   Anxiety and depression    Chronic constipation    Diabetes mellitus, type II ([place])    Dr. Dorris Fetch managing   Frequent headaches chronic   ibuprofen helps some   GERD (gastroesophageal reflux disease)    History of UTI    Recurrent per pt (approx 3 per year)   Hyperlipidemia    Hypertension    Leukocytosis 12/2015   mild lymphocytosis.  Path smear review reassuring.  Repeat 01/2016 stable.   Morbid obesity (HCC)    OSA on CPAP    not using CPAP anymore    Past Surgical History:  Procedure Laterality Date   ABDOMINAL HYSTERECTOMY  1998   Dysfunctional uterine bleeding.  No hx of abnormal paps.   BIOPSY  10/20/2018   Procedure: BIOPSY;  Surgeon: Danie Binder, MD;  Location: [facility];  Service: Endoscopy;;  stomach   COLONOSCOPY  2012   Dr. Earley Brooke: 1.5 cm pendunculated descending colon polyp (tubulovillous adenoma) and diminutive hyperplastic polyps   COLONOSCOPY N/A 10/20/2018   Procedure: COLONOSCOPY;  Surgeon: Danie Binder, MD; 4 tubular adenomas, diverticulosis in the sigmoid colon, tortuous left colon, external and internal hemorrhoids. Due for repeat in 2023.    ESOPHAGOGASTRODUODENOSCOPY N/A 10/20/2018   Procedure: ESOPHAGOGASTRODUODENOSCOPY (EGD);  Surgeon: Danie Binder, MD;  gastritis and duodenitis. No H. Pylori.    POLYPECTOMY  10/20/2018   Procedure: POLYPECTOMY;  Surgeon: Danie Binder, MD;  Location: [facility];  Service: Endoscopy;;   wart removal  08/04/2019   benign    Social History   Socioeconomic History   Marital  status: Divorced    Spouse name: Not on file   Number of children: Not on file   Years of education: Not on file   Highest education level: Not on file  Occupational History   Not on file  Tobacco Use   Smoking status: Every Day    Packs/day: 0.25    Years: 20.00    Total pack years: 5.00    Types: Cigarettes   Smokeless tobacco: Never  Vaping Use   Vaping Use: Never used  Substance and Sexual Activity   Alcohol use: No   Drug use: No   Sexual activity: Not on file  Other Topics Concern   Not on file  Social History Narrative   Separated x 20 yrs, one daughter.  She lives with her.  Great niece and nephew live with her as well.   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   Tob: 7 pack year hx, quit 2010.   Alc: none   Exercise: none   Social Determinants of Health   Financial Resource Strain: Not on file  Food Insecurity: Not on file  Transportation Needs: Not on file  Physical Activity: Not on  file  Stress: Not on file  Social Connections: Not on file    Family History  Problem Relation Age of Onset   Cancer Mother        ovarian   COPD Father    Diabetes Father    Hyperlipidemia Father    Heart attack Father    Heart failure Father    Colon cancer Neg Hx     Outpatient Encounter Medications as of 03/27/2022  Medication Sig   albuterol (VENTOLIN HFA) 108 (90 Base) MCG/ACT inhaler INHALE 2 PUFFS BY MOUTH EVERY 6 HOURS AS NEEDED FOR COUGHING, WHEEZING, OR SHORTNESS OF BREATH (Patient not taking: Reported on 03/08/2022)   atorvastatin (LIPITOR) 20 MG tablet TAKE 1 Tablet BY MOUTH ONCE EVERY DAY   cetirizine (ZYRTEC) 10 MG tablet Take 10 mg by mouth daily.   cloNIDine (CATAPRES) 0.1 MG tablet Take 1 tablet (0.1 mg total) by mouth 2 (two) times daily.   gabapentin (NEURONTIN) 100 MG capsule Take 1 capsule (100 mg total) by mouth every 8 (eight) hours as needed.   glipiZIDE (GLUCOTROL) 5 MG tablet Take 1 tablet (5 mg total) by mouth daily before breakfast.    hydrALAZINE (APRESOLINE) 10 MG tablet Take 1 tablet (10 mg total) by mouth 3 (three) times daily.   hydrochlorothiazide (HYDRODIURIL) 25 MG tablet Take 0.5 tablets (12.5 mg total) by mouth daily.   insulin glargine (LANTUS) 100 UNIT/ML injection Inject 0.8 mLs (80 Units total) into the skin at bedtime.   insulin glulisine (APIDRA) 100 UNIT/ML Solostar Pen Inject 8-14 Units into the skin 3 (three) times daily before meals.   losartan (COZAAR) 100 MG tablet TAKE 1 Tablet BY MOUTH ONCE EVERY DAY   metFORMIN (GLUCOPHAGE) 1000 MG tablet Take 1 tablet (1,000 mg total) by mouth 2 (two) times daily with a meal.   metoprolol tartrate (LOPRESSOR) 100 MG tablet Take 1 tablet (100 mg total) by mouth 2 (two) times daily.   pantoprazole (PROTONIX) 40 MG tablet Take 1 tablet (40 mg total) by mouth daily before breakfast.   No facility-administered encounter medications on file as of 03/27/2022.    ALLERGIES: Allergies  Allergen Reactions   Amlodipine Swelling   Januvia [Sitagliptin] Other (See Comments)    Caused pancreatitis    VACCINATION STATUS: Immunization History  Administered Date(s) Administered   Tdap 02/17/2016    Diabetes She presents for her follow-up diabetic visit. She has type 2 diabetes mellitus. Onset time: She was diagnosed at approximate age of 61 years.  She was previously seen in this clinic prior to 2018. Her disease course has been improving. There are no hypoglycemic associated symptoms. Pertinent negatives for hypoglycemia include no confusion, headaches, pallor or seizures. Associated symptoms include fatigue, polydipsia and polyuria. Pertinent negatives for diabetes include no chest pain and no polyphagia. There are no hypoglycemic complications. Symptoms are worsening. (Noncompliance.) Risk factors for coronary artery disease include diabetes mellitus, dyslipidemia, hypertension, tobacco exposure, post-menopausal, sedentary lifestyle, family history and obesity. Current  diabetic treatment includes insulin injections. Her weight is increasing steadily. She is following a generally unhealthy diet. When asked about meal planning, she reported none. She has not had a previous visit with a dietitian. She never participates in exercise. Her bedtime blood glucose range is generally >200 mg/dl. (She presents without any meter nor logs. Her POC A1c is worsening to 10.4% from 8.4%. She did not document any hypoglycemia.    ) An ACE inhibitor/angiotensin II receptor blocker is being taken. Eye exam is current.  Hyperlipidemia This is a chronic problem. The current episode started more than 1 year ago. The problem is uncontrolled. Exacerbating diseases include diabetes and obesity. Pertinent negatives include no chest pain, myalgias or shortness of breath. Current antihyperlipidemic treatment includes statins. Risk factors for coronary artery disease include diabetes mellitus, dyslipidemia, hypertension, a sedentary lifestyle, post-menopausal, obesity and family history.  Hypertension This is a chronic problem. The current episode started more than 1 year ago. The problem is controlled. Pertinent negatives include no chest pain, headaches, palpitations or shortness of breath. Risk factors for coronary artery disease include diabetes mellitus, dyslipidemia, family history, obesity, post-menopausal state, sedentary lifestyle and smoking/tobacco exposure. Past treatments include angiotensin blockers.     Review of Systems  Constitutional:  Positive for fatigue. Negative for chills, fever and unexpected weight change.  HENT:  Negative for trouble swallowing and voice change.   Eyes:  Negative for visual disturbance.  Respiratory:  Negative for cough, shortness of breath and wheezing.   Cardiovascular:  Negative for chest pain, palpitations and leg swelling.  Gastrointestinal:  Negative for diarrhea, nausea and vomiting.  Endocrine: Positive for polydipsia and polyuria. Negative  for cold intolerance, heat intolerance and polyphagia.  Musculoskeletal:  Negative for arthralgias and myalgias.  Skin:  Negative for color change, pallor, rash and wound.  Neurological:  Negative for seizures and headaches.  Psychiatric/Behavioral:  Negative for confusion and suicidal ideas.     Objective:       03/27/2022    3:28 PM 03/08/2022   10:40 AM 10/19/2021    8:08 AM  Vitals with BMI  Height '5\' 5"'$  '5\' 5"'$  '5\' 5"'$   Weight 223 lbs 13 oz 230 lbs 10 oz 221 lbs 13 oz  BMI 37.24 15.40 08.67  Systolic 619 509 326  Diastolic 72 72 72  Pulse 60 89 84    BP 130/72   Pulse 60   Ht '5\' 5"'$  (1.651 m)   Wt 223 lb 12.8 oz (101.5 kg)   BMI 37.24 kg/m   Wt Readings from Last 3 Encounters:  03/27/22 223 lb 12.8 oz (101.5 kg)  03/08/22 230 lb 9.6 oz (104.6 kg)  10/19/21 221 lb 12.8 oz (100.6 kg)      CMP ( most recent) CMP     Component Value Date/Time   NA 141 03/08/2022 1230   K 3.9 03/08/2022 1230   CL 106 03/08/2022 1230   CO2 26 03/08/2022 1230   GLUCOSE 126 (H) 03/08/2022 1230   BUN 11 03/08/2022 1230   CREATININE 0.70 03/08/2022 1230   CREATININE 0.66 08/27/2016 1521   CALCIUM 9.1 03/08/2022 1230   PROT 7.5 03/08/2022 1230   ALBUMIN 3.4 (L) 03/08/2022 1230   AST 24 03/08/2022 1230   ALT 18 03/08/2022 1230   ALKPHOS 81 03/08/2022 1230   BILITOT 0.6 03/08/2022 1230   GFRNONAA >60 03/08/2022 1230   GFRAA >60 02/09/2020 1054     Diabetic Labs (most recent): Lab Results  Component Value Date   HGBA1C 10.4 (A) 03/27/2022   HGBA1C 8.4 (A) 09/28/2021   HGBA1C 7.5 (A) 05/31/2021   MICROALBUR 150 03/27/2022   MICROALBUR 59.3 (H) 10/02/2021   MICROALBUR 154.7 (H) 04/29/2020     Lipid Panel ( most recent) Lipid Panel     Component Value Date/Time   CHOL 231 (H) 03/08/2022 1230   TRIG 179 (H) 03/08/2022 1230   HDL 44 03/08/2022 1230   CHOLHDL 5.3 03/08/2022 1230   VLDL 36 03/08/2022 1230   LDLCALC 151 (  H) 03/08/2022 1230   LDLDIRECT 119.0 01/18/2016 1038       Lab Results  Component Value Date   TSH 3.547 10/11/2020   TSH 1.23 01/18/2016   FREET4 0.69 10/11/2020      Assessment & Plan:   1. Uncontrolled type 2 diabetes mellitus with hyperglycemia ([address])   - [place] has currently uncontrolled symptomatic type 2 DM since  61 years of age,  with most recent A1c of 10.8 %. Recent labs reviewed.  She presents without any meter nor logs. Her POC A1c is worsening to 10.4% from 8.4%. She did not document any hypoglycemia.    - I had a long discussion with her about the progressive nature of diabetes and the pathology behind its complications. -her diabetes is complicated by noncompliance/nonadherence, chronic smoking, obesity/sedentary life and she remains at a high risk for more acute and chronic complications which include CAD, CVA, CKD, retinopathy, and neuropathy. These are all discussed in detail with her.  - I have counseled her on diet  and weight management  by adopting a carbohydrate restricted/protein rich diet. Patient is encouraged to switch to  unprocessed or minimally processed     complex starch and increased protein intake (animal or plant source), fruits, and vegetables. -  she is advised to stick to a routine mealtimes to eat 3 meals  a day and avoid unnecessary snacks ( to snack only to correct hypoglycemia).   She did not engage in lifestyle medicine, she promises to try again. - she acknowledges that there is a room for improvement in her food and drink choices. - Suggestion is made for her to avoid simple carbohydrates  from her diet including Cakes, Sweet Desserts, Ice Cream, Soda (diet and regular), Sweet Tea, Candies, Chips, Cookies, Store Bought Juices, Alcohol , Artificial Sweeteners,  Coffee Creamer, and "Sugar-free" Products, Lemonade. This will help patient to have more stable blood glucose profile and potentially avoid unintended weight gain.  The following Lifestyle Medicine recommendations according to  [place]  [facility]) were discussed and and offered to patient and she  agrees to start the journey:  A. Whole Foods, Plant-Based Nutrition comprising of fruits and vegetables, plant-based proteins, whole-grain carbohydrates was discussed in detail with the patient.   A list for source of those nutrients were also provided to the patient.  Patient will use only water or unsweetened tea for hydration. B.  The need to stay away from risky substances including alcohol, smoking; obtaining 7 to 9 hours of restorative sleep, at least 150 minutes of moderate intensity exercise weekly, the importance of healthy social connections,  and stress management techniques were discussed. C.  A full color page of  Calorie density of various food groups per pound showing examples of each food groups was provided to the patient.   - I have approached her with the following individualized plan to manage  her diabetes and patient agrees:   -In light of her presentation with loss  of control , she will continue to need intensive treatment with basal/bolus insulin.  -She is advised to resume and continue  Lantus 80 units nightly, resume and continue  Apidra to 8-14  units 3 times a day with meals  for pre-meal BG readings of 90-'150mg'$ /dl, plus patient specific correction dose for unexpected hyperglycemia above '150mg'$ /dl, associated with strict monitoring of glucose 4 times a day-before meals and at bedtime. - she is warned not to take insulin without proper monitoring  per orders. - Adjustment parameters are given to her for hypo and hyperglycemia in writing. - she is encouraged to call clinic for blood glucose levels less than 70 or above 200 mg /dl. - she is advised to continue metformin 1000 mg p.o. twice daily, and glipizide 5 mg XL p.o. daily at breakfast.     - she is high risk for pancreatitis due to chronic heavy smoking, will avoid incretin therapy for now.   - Specific targets for  A1c;   LDL, HDL,  and Triglycerides were discussed with the patient.  2) Blood Pressure /Hypertension:   - Her BP is controlled to target.   she is advised to continue her current medications including amlodipine 10 mg p.o. daily, clonidine 0.1 mg p.o. twice daily.  Her ongoing smoking is working again is her efforts to control hypertension.    The patient was counseled on the dangers of tobacco use, and was advised to quit.  Reviewed strategies to maximize success, including removing cigarettes and smoking materials from environment.   3) Lipids/Hyperlipidemia:   Review of her recent lipid panel showed uncontrolled  LDL at  151. -  she  is advised to continue Atorvastatin 20 mg p.o. daily at bedtime.  Side effects and precautions discussed with her.    4)  Weight/Diet:  Body mass index is 37.24 kg/m.  -   clearly complicating her diabetes care.   she is  a candidate for weight loss. I discussed with her the fact that loss of 5 - 10% of her  current body weight will have the most impact on her diabetes management.  Exercise, and detailed carbohydrates information provided  -  detailed on discharge instructions.  5) Chronic Care/Health Maintenance:  -she  is on ACEI/ARB and Statin medications and  is encouraged to initiate and continue to follow up with Ophthalmology, Dentist,  Podiatrist at least yearly or according to recommendations, and advised to  quit smoking. I have recommended yearly flu vaccine and pneumonia vaccine at least every 5 years; moderate intensity exercise for up to 150 minutes weekly; and  sleep for at least 7 hours a day.   Her recent point-of-care screening ABI was negative for PAD.  This study will be repeated in March 2027, or sooner if needed.  - she is  advised to maintain close follow up with Pcp, No for primary care needs, as well as her other providers for optimal and coordinated care.   I spent 41 minutes in the care of the patient today including review of labs from  [facility], Lipids, Thyroid Function, Hematology (current and previous including abstractions from other facilities); face-to-face time discussing  her blood glucose readings/logs, discussing hypoglycemia and hyperglycemia episodes and symptoms, medications doses, her options of short and long term treatment based on the latest standards of care / guidelines;  discussion about incorporating lifestyle medicine;  and documenting the encounter. Risk reduction counseling performed per USPSTF guidelines to reduce obesity and cardiovascular risk factors.     Please refer to Patient Instructions for Blood Glucose Monitoring and Insulin/Medications Dosing Guide"  in media tab for additional information. Please  also refer to " Patient Self Inventory" in the Media  tab for reviewed elements of pertinent patient history.  [place] participated in the discussions, expressed understanding, and voiced agreement with the above plans.  All questions were answered to her satisfaction. she is encouraged to contact clinic should she have any questions or concerns prior to her return  visit.    Follow up plan: - Return in about 2 weeks (around 04/10/2022) for F/U with Meter/CGM [facility] Only - no Labs.  Glade Lloyd, MD [facility] [address] [place], [place] [address] Phone: [phone]  Fax: [phone]    03/27/2022, 4:39 PM  This note was partially dictated with voice recognition software. Similar sounding words can be transcribed inadequately or may not  be corrected upon review.

## 2022-03-27 NOTE — Patient Instructions (Signed)

## 2022-04-10 ENCOUNTER — Ambulatory Visit (INDEPENDENT_AMBULATORY_CARE_PROVIDER_SITE_OTHER): Payer: Self-pay | Admitting: "Endocrinology

## 2022-04-10 ENCOUNTER — Encounter: Payer: Self-pay | Admitting: "Endocrinology

## 2022-04-10 VITALS — BP 132/78 | HR 68 | Ht 65.0 in | Wt 226.8 lb

## 2022-04-10 DIAGNOSIS — E1165 Type 2 diabetes mellitus with hyperglycemia: Secondary | ICD-10-CM

## 2022-04-10 DIAGNOSIS — I10 Essential (primary) hypertension: Secondary | ICD-10-CM

## 2022-04-10 DIAGNOSIS — F17200 Nicotine dependence, unspecified, uncomplicated: Secondary | ICD-10-CM

## 2022-04-10 DIAGNOSIS — E782 Mixed hyperlipidemia: Secondary | ICD-10-CM

## 2022-04-10 MED ORDER — INSULIN GLULISINE 100 UNIT/ML SOLOSTAR PEN: 10.0000 [IU] | PEN_INJECTOR | Freq: Three times a day (TID) | SUBCUTANEOUS | 1 refills | Status: DC

## 2022-04-10 NOTE — Progress Notes (Signed)
04/10/2022, 4:53 PM  Endocrinology follow-up note   Subjective:    Patient ID: Casey Johnston, female    DOB: August 08, 1960.  Casey Johnston is being seen in follow-up after she was seen in consultation for management of currently uncontrolled type 2 diabetes, hyperlipidemia, hypertension. PCP: Pcp, No.   Past Medical History:  Diagnosis Date   Anxiety and depression    Chronic constipation    Diabetes mellitus, type II ([place])    Dr. Dorris Fetch managing   Frequent headaches chronic   ibuprofen helps some   GERD (gastroesophageal reflux disease)    History of UTI    Recurrent per pt (approx 3 per year)   Hyperlipidemia    Hypertension    Leukocytosis 12/2015   mild lymphocytosis.  Path smear review reassuring.  Repeat 01/2016 stable.   Morbid obesity (HCC)    OSA on CPAP    not using CPAP anymore    Past Surgical History:  Procedure Laterality Date   ABDOMINAL HYSTERECTOMY  1998   Dysfunctional uterine bleeding.  No hx of abnormal paps.   BIOPSY  10/20/2018   Procedure: BIOPSY;  Surgeon: Danie Binder, MD;  [facility]: [facility];  Service: Endoscopy;;  stomach   COLONOSCOPY  2012   Dr. Earley Brooke: 1.5 cm pendunculated descending colon polyp (tubulovillous adenoma) and diminutive hyperplastic polyps   COLONOSCOPY N/A 10/20/2018   Procedure: COLONOSCOPY;  Surgeon: Danie Binder, MD; 4 tubular adenomas, diverticulosis in the sigmoid colon, tortuous left colon, external and internal hemorrhoids. Due for repeat in 2023.    ESOPHAGOGASTRODUODENOSCOPY N/A 10/20/2018   Procedure: ESOPHAGOGASTRODUODENOSCOPY (EGD);  Surgeon: Danie Binder, MD;  gastritis and duodenitis. No H. Pylori.    POLYPECTOMY  10/20/2018   Procedure: POLYPECTOMY;  Surgeon: Danie Binder, MD;  Location: [facility];  Service: Endoscopy;;   wart removal  08/04/2019   benign    Social History   Socioeconomic History   Marital  status: Divorced    Spouse name: Not on file   Number of children: Not on file   Years of education: Not on file   Highest education level: Not on file  Occupational History   Not on file  Tobacco Use   Smoking status: Every Day    Packs/day: 0.25    Years: 20.00    Total pack years: 5.00    Types: Cigarettes   Smokeless tobacco: Never  Vaping Use   Vaping Use: Never used  Substance and Sexual Activity   Alcohol use: No   Drug use: No   Sexual activity: Not on file  Other Topics Concern   Not on file  Social History Narrative   Separated x 20 yrs, one daughter.  She lives with her.  Great niece and nephew live with her as well.   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   Tob: 7 pack year hx, quit 2010.   Alc: none   Exercise: none   Social Determinants of Health   Financial Resource Strain: Not on file  Food Insecurity: Not on file  Transportation Needs: Not on file  Physical Activity: Not on  file  Stress: Not on file  Social Connections: Not on file    Family History  Problem Relation Age of Onset   Cancer Mother        ovarian   COPD Father    Diabetes Father    Hyperlipidemia Father    Heart attack Father    Heart failure Father    Colon cancer Neg Hx     Outpatient Encounter Medications as of 04/10/2022  Medication Sig   albuterol (VENTOLIN HFA) 108 (90 Base) MCG/ACT inhaler INHALE 2 PUFFS BY MOUTH EVERY 6 HOURS AS NEEDED FOR COUGHING, WHEEZING, OR SHORTNESS OF BREATH (Patient not taking: Reported on 03/08/2022)   atorvastatin (LIPITOR) 20 MG tablet TAKE 1 Tablet BY MOUTH ONCE EVERY DAY   cetirizine (ZYRTEC) 10 MG tablet Take 10 mg by mouth daily.   cloNIDine (CATAPRES) 0.1 MG tablet Take 1 tablet (0.1 mg total) by mouth 2 (two) times daily.   gabapentin (NEURONTIN) 100 MG capsule Take 1 capsule (100 mg total) by mouth every 8 (eight) hours as needed.   glipiZIDE (GLUCOTROL) 5 MG tablet Take 1 tablet (5 mg total) by mouth daily before breakfast.    hydrALAZINE (APRESOLINE) 10 MG tablet Take 1 tablet (10 mg total) by mouth 3 (three) times daily.   hydrochlorothiazide (HYDRODIURIL) 25 MG tablet Take 0.5 tablets (12.5 mg total) by mouth daily.   insulin glargine (LANTUS) 100 UNIT/ML injection Inject 0.8 mLs (80 Units total) into the skin at bedtime.   insulin glulisine (APIDRA) 100 UNIT/ML Solostar Pen Inject 10-16 Units into the skin 3 (three) times daily before meals.   losartan (COZAAR) 100 MG tablet TAKE 1 Tablet BY MOUTH ONCE EVERY DAY   metFORMIN (GLUCOPHAGE) 1000 MG tablet Take 1 tablet (1,000 mg total) by mouth 2 (two) times daily with a meal.   metoprolol tartrate (LOPRESSOR) 100 MG tablet Take 1 tablet (100 mg total) by mouth 2 (two) times daily.   pantoprazole (PROTONIX) 40 MG tablet Take 1 tablet (40 mg total) by mouth daily before breakfast.   [DISCONTINUED] insulin glulisine (APIDRA) 100 UNIT/ML Solostar Pen Inject 8-14 Units into the skin 3 (three) times daily before meals.   No facility-administered encounter medications on file as of 04/10/2022.    ALLERGIES: Allergies  Allergen Reactions   Amlodipine Swelling   Januvia [Sitagliptin] Other (See Comments)    Caused pancreatitis    VACCINATION STATUS: Immunization History  Administered Date(s) Administered   Tdap 02/17/2016    Diabetes She presents for her follow-up diabetic visit. She has type 2 diabetes mellitus. Onset time: She was diagnosed at approximate age of 59 years.  She was previously seen in this clinic prior to 2018. Her disease course has been worsening. There are no hypoglycemic associated symptoms. Pertinent negatives for hypoglycemia include no confusion, headaches, pallor or seizures. Associated symptoms include fatigue, polydipsia and polyuria. Pertinent negatives for diabetes include no chest pain and no polyphagia. There are no hypoglycemic complications. Symptoms are worsening. (Noncompliance.) Risk factors for coronary artery disease include  diabetes mellitus, dyslipidemia, hypertension, tobacco exposure, post-menopausal, sedentary lifestyle, family history and obesity. Current diabetic treatment includes insulin injections. Her weight is increasing steadily. She is following a generally unhealthy diet. When asked about meal planning, she reported none. She has not had a previous visit with a dietitian. She never participates in exercise. Her home blood glucose trend is increasing steadily. Her breakfast blood glucose range is generally 180-200 mg/dl. Her lunch blood glucose range is generally >200 mg/dl.  Her dinner blood glucose range is generally >200 mg/dl. Her bedtime blood glucose range is generally >200 mg/dl. Her overall blood glucose range is >200 mg/dl. (She presents with her meter and logs.  She presents with slightly improved glycemic profile compared to last visit.  She did not document any hypoglycemia.     ) An ACE inhibitor/angiotensin II receptor blocker is being taken. Eye exam is current.  Hyperlipidemia This is a chronic problem. The current episode started more than 1 year ago. The problem is uncontrolled. Exacerbating diseases include diabetes and obesity. Pertinent negatives include no chest pain, myalgias or shortness of breath. Current antihyperlipidemic treatment includes statins. Risk factors for coronary artery disease include diabetes mellitus, dyslipidemia, hypertension, a sedentary lifestyle, post-menopausal, obesity and family history.  Hypertension This is a chronic problem. The current episode started more than 1 year ago. The problem is controlled. Pertinent negatives include no chest pain, headaches, palpitations or shortness of breath. Risk factors for coronary artery disease include diabetes mellitus, dyslipidemia, family history, obesity, post-menopausal state, sedentary lifestyle and smoking/tobacco exposure. Past treatments include angiotensin blockers.     Review of Systems  Constitutional:  Positive  for fatigue. Negative for chills, fever and unexpected weight change.  HENT:  Negative for trouble swallowing and voice change.   Eyes:  Negative for visual disturbance.  Respiratory:  Negative for cough, shortness of breath and wheezing.   Cardiovascular:  Negative for chest pain, palpitations and leg swelling.  Gastrointestinal:  Negative for diarrhea, nausea and vomiting.  Endocrine: Positive for polydipsia and polyuria. Negative for cold intolerance, heat intolerance and polyphagia.  Musculoskeletal:  Negative for arthralgias and myalgias.  Skin:  Negative for color change, pallor, rash and wound.  Neurological:  Negative for seizures and headaches.  Psychiatric/Behavioral:  Negative for confusion and suicidal ideas.     Objective:       04/10/2022    3:03 PM 03/27/2022    3:28 PM 03/08/2022   10:40 AM  Vitals with BMI  Height '5\' 5"'$  '5\' 5"'$  '5\' 5"'$   Weight 226 lbs 13 oz 223 lbs 13 oz 230 lbs 10 oz  BMI 37.74 50.03 70.48  Systolic 889 169 450  Diastolic 78 72 72  Pulse 68 60 89    BP 132/78   Pulse 68   Ht '5\' 5"'$  (1.651 m)   Wt 226 lb 12.8 oz (102.9 kg)   BMI 37.74 kg/m   Wt Readings from Last 3 Encounters:  04/10/22 226 lb 12.8 oz (102.9 kg)  03/27/22 223 lb 12.8 oz (101.5 kg)  03/08/22 230 lb 9.6 oz (104.6 kg)      CMP ( most recent) CMP     Component Value Date/Time   NA 141 03/08/2022 1230   K 3.9 03/08/2022 1230   CL 106 03/08/2022 1230   CO2 26 03/08/2022 1230   GLUCOSE 126 (H) 03/08/2022 1230   BUN 11 03/08/2022 1230   CREATININE 0.70 03/08/2022 1230   CREATININE 0.66 08/27/2016 1521   CALCIUM 9.1 03/08/2022 1230   PROT 7.5 03/08/2022 1230   ALBUMIN 3.4 (L) 03/08/2022 1230   AST 24 03/08/2022 1230   ALT 18 03/08/2022 1230   ALKPHOS 81 03/08/2022 1230   BILITOT 0.6 03/08/2022 1230   GFRNONAA >60 03/08/2022 1230   GFRAA >60 02/09/2020 1054     Diabetic Labs (most recent): Lab Results  Component Value Date   HGBA1C 10.4 (A) 03/27/2022   HGBA1C  8.4 (A) 09/28/2021   HGBA1C 7.5 (A)  05/31/2021   MICROALBUR 150 03/27/2022   MICROALBUR 59.3 (H) 10/02/2021   MICROALBUR 154.7 (H) 04/29/2020     Lipid Panel ( most recent) Lipid Panel     Component Value Date/Time   CHOL 231 (H) 03/08/2022 1230   TRIG 179 (H) 03/08/2022 1230   HDL 44 03/08/2022 1230   CHOLHDL 5.3 03/08/2022 1230   VLDL 36 03/08/2022 1230   LDLCALC 151 (H) 03/08/2022 1230   LDLDIRECT 119.0 01/18/2016 1038      Lab Results  Component Value Date   TSH 3.547 10/11/2020   TSH 1.23 01/18/2016   FREET4 0.69 10/11/2020      Assessment & Plan:   1. Uncontrolled type 2 diabetes mellitus with hyperglycemia ([place])   - [place] has currently uncontrolled symptomatic type 2 DM since  61 years of age,  with most recent A1c of 10.8 %. Recent labs reviewed.  She presents with her meter and logs.  She presents with slightly improved glycemic profile compared to last visit.  She did not document any hypoglycemia.    - I had a long discussion with her about the progressive nature of diabetes and the pathology behind its complications. -her diabetes is complicated by noncompliance/nonadherence, chronic smoking, obesity/sedentary life and she remains at a high risk for more acute and chronic complications which include CAD, CVA, CKD, retinopathy, and neuropathy. These are all discussed in detail with her.  - I have counseled her on diet  and weight management  by adopting a carbohydrate restricted/protein rich diet. Patient is encouraged to switch to  unprocessed or minimally processed     complex starch and increased protein intake (animal or plant source), fruits, and vegetables. -  she is advised to stick to a routine mealtimes to eat 3 meals  a day and avoid unnecessary snacks ( to snack only to correct hypoglycemia).   She did not engage in lifestyle medicine, she promises to try again. - she acknowledges that there is a room for improvement in her food and  drink choices. - Suggestion is made for her to avoid simple carbohydrates  from her diet including Cakes, Sweet Desserts, Ice Cream, Soda (diet and regular), Sweet Tea, Candies, Chips, Cookies, Store Bought Juices, Alcohol , Artificial Sweeteners,  Coffee Creamer, and "Sugar-free" Products, Lemonade. This will help patient to have more stable blood glucose profile and potentially avoid unintended weight gain.  The following Lifestyle Medicine recommendations according to [facility]) were discussed and and offered to patient and she  agrees to start the journey:  A. Whole Foods, Plant-Based Nutrition comprising of fruits and vegetables, plant-based proteins, whole-grain carbohydrates was discussed in detail with the patient.   A list for source of those nutrients were also provided to the patient.  Patient will use only water or unsweetened tea for hydration. B.  The need to stay away from risky substances including alcohol, smoking; obtaining 7 to 9 hours of restorative sleep, at least 150 minutes of moderate intensity exercise weekly, the importance of healthy social connections,  and stress management techniques were discussed. C.  A full color page of  Calorie density of various food groups per pound showing examples of each food groups was provided to the patient.   - I have approached her with the following individualized plan to manage  her diabetes and patient agrees:   -In light of her presentation with loss  of control , she will continue to need  intensive treatment with basal/bolus insulin.  -She is advised to continue Lantus 80 units nightly, advised to increase albuterol to 10-16    units 3 times a day with meals  for pre-meal BG readings of 90-'150mg'$ /dl, plus patient specific correction dose for unexpected hyperglycemia above '150mg'$ /dl, associated with strict monitoring of glucose 4 times a day-before meals and at bedtime. - she is warned not to take  insulin without proper monitoring per orders. - Adjustment parameters are given to her for hypo and hyperglycemia in writing. - she is encouraged to call clinic for blood glucose levels less than 70 or above 200 mg /dl. - she is advised to continue metformin 1000 mg p.o. twice daily, and glipizide 5 mg XL p.o. daily at breakfast.     - she is high risk for pancreatitis due to chronic heavy smoking, will avoid incretin therapy for now.   - Specific targets for  A1c;  LDL, HDL,  and Triglycerides were discussed with the patient.  2) Blood Pressure /Hypertension:   -Her blood pressure is controlled to target.   she is advised to continue her current medications including amlodipine 10 mg p.o. daily, clonidine 0.1 mg p.o. twice daily.  Her ongoing smoking is working again is her efforts to control hypertension.    The patient was counseled on the dangers of tobacco use, and was advised to quit.  Reviewed strategies to maximize success, including removing cigarettes and smoking materials from environment.   3) Lipids/Hyperlipidemia:   Review of her recent lipid panel showed uncontrolled  LDL at  151. -  she  is advised to continue Atorvastatin 20 mg p.o. daily at bedtime.  Side effects and precautions discussed with her.    4)  Weight/Diet:  Body mass index is 37.74 kg/m.  -   clearly complicating her diabetes care.   she is  a candidate for weight loss. I discussed with her the fact that loss of 5 - 10% of her  current body weight will have the most impact on her diabetes management.  Exercise, and detailed carbohydrates information provided  -  detailed on discharge instructions.  5) Chronic Care/Health Maintenance:  -she  is on ACEI/ARB and Statin medications and  is encouraged to initiate and continue to follow up with Ophthalmology, Dentist,  Podiatrist at least yearly or according to recommendations, and advised to  quit smoking. I have recommended yearly flu vaccine and pneumonia vaccine  at least every 5 years; moderate intensity exercise for up to 150 minutes weekly; and  sleep for at least 7 hours a day.   Her recent point-of-care screening ABI was negative for PAD.  This study will be repeated in March 2027, or sooner if needed.  - she is  advised to maintain close follow up with Pcp, No for primary care needs, as well as her other providers for optimal and coordinated care.   I spent 26 minutes in the care of the patient today including review of labs from [facility], Lipids, Thyroid Function, Hematology (current and previous including abstractions from other facilities); face-to-face time discussing  her blood glucose readings/logs, discussing hypoglycemia and hyperglycemia episodes and symptoms, medications doses, her options of short and long term treatment based on the latest standards of care / guidelines;  discussion about incorporating lifestyle medicine;  and documenting the encounter. Risk reduction counseling performed per USPSTF guidelines to reduce  obesity and cardiovascular risk factors.     Please refer to Patient Instructions for Blood Glucose Monitoring  and Insulin/Medications Dosing Guide"  in media tab for additional information. Please  also refer to " Patient Self Inventory" in the Media  tab for reviewed elements of pertinent patient history.  [place] participated in the discussions, expressed understanding, and voiced agreement with the above plans.  All questions were answered to her satisfaction. she is encouraged to contact clinic should she have any questions or concerns prior to her return visit.    Follow up plan: - Return in about 3 months (around 07/11/2022) for Bring Meter/CGM Device/Logs- A1c in Office.  Glade Lloyd, MD [facility] [address] [place],  [address] Phone: [phone]  Fax: [phone]    04/10/2022, 4:53 PM  This note was partially dictated with voice  recognition software. Similar sounding words can be transcribed inadequately or may not  be corrected upon review.

## 2022-04-10 NOTE — Patient Instructions (Signed)

## 2022-07-11 ENCOUNTER — Encounter: Payer: Self-pay | Admitting: "Endocrinology

## 2022-07-11 ENCOUNTER — Ambulatory Visit (INDEPENDENT_AMBULATORY_CARE_PROVIDER_SITE_OTHER): Payer: Self-pay | Admitting: "Endocrinology

## 2022-07-11 VITALS — BP 154/72 | HR 84 | Ht 65.0 in | Wt 219.4 lb

## 2022-07-11 DIAGNOSIS — I10 Essential (primary) hypertension: Secondary | ICD-10-CM

## 2022-07-11 DIAGNOSIS — E782 Mixed hyperlipidemia: Secondary | ICD-10-CM

## 2022-07-11 DIAGNOSIS — F17200 Nicotine dependence, unspecified, uncomplicated: Secondary | ICD-10-CM

## 2022-07-11 DIAGNOSIS — E1165 Type 2 diabetes mellitus with hyperglycemia: Secondary | ICD-10-CM

## 2022-07-11 DIAGNOSIS — E66812 Obesity, class 2: Secondary | ICD-10-CM

## 2022-07-11 DIAGNOSIS — Z6836 Body mass index (BMI) 36.0-36.9, adult: Secondary | ICD-10-CM

## 2022-07-11 LAB — POCT GLYCOSYLATED HEMOGLOBIN (HGB A1C): HbA1c, POC (controlled diabetic range): 10.9 % — AB (ref 0.0–7.0)

## 2022-07-11 MED ORDER — INSULIN GLULISINE 100 UNIT/ML SOLOSTAR PEN: 10.0000 [IU] | PEN_INJECTOR | Freq: Three times a day (TID) | SUBCUTANEOUS | 1 refills | Status: DC

## 2022-07-11 MED ORDER — GLIPIZIDE 5 MG PO TABS: 5.0000 mg | ORAL_TABLET | Freq: Every day | ORAL | 1 refills | Status: DC

## 2022-07-11 NOTE — Patient Instructions (Signed)

## 2022-07-11 NOTE — Progress Notes (Signed)
07/11/2022, 4:27 PM  Endocrinology follow-up note   Subjective:    Patient ID: Casey Johnston, female    DOB: 1961/04/04.  Casey Johnston is being seen in follow-up after she was seen in consultation for management of currently uncontrolled type 2 diabetes, hyperlipidemia, hypertension. PCP: Leonie Douglas, MD.   Past Medical History:  Diagnosis Date   Anxiety and depression    Chronic constipation    Diabetes mellitus, type II ([place])    Dr. Dorris Fetch managing   Frequent headaches chronic   ibuprofen helps some   GERD (gastroesophageal reflux disease)    History of UTI    Recurrent per pt (approx 3 per year)   Hyperlipidemia    Hypertension    Leukocytosis 12/2015   mild lymphocytosis.  Path smear review reassuring.  Repeat 01/2016 stable.   Morbid obesity (HCC)    OSA on CPAP    not using CPAP anymore    Past Surgical History:  Procedure Laterality Date   ABDOMINAL HYSTERECTOMY  1998   Dysfunctional uterine bleeding.  No hx of abnormal paps.   BIOPSY  10/20/2018   Procedure: BIOPSY;  Surgeon: Danie Binder, MD;  Location: [facility];  Service: Endoscopy;;  stomach   COLONOSCOPY  2012   Dr. Earley Brooke: 1.5 cm pendunculated descending colon polyp (tubulovillous adenoma) and diminutive hyperplastic polyps   COLONOSCOPY N/A 10/20/2018   Procedure: COLONOSCOPY;  Surgeon: Danie Binder, MD; 4 tubular adenomas, diverticulosis in the sigmoid colon, tortuous left colon, external and internal hemorrhoids. Due for repeat in 2023.    ESOPHAGOGASTRODUODENOSCOPY N/A 10/20/2018   Procedure: ESOPHAGOGASTRODUODENOSCOPY (EGD);  Surgeon: Danie Binder, MD;  gastritis and duodenitis. No H. Pylori.    POLYPECTOMY  10/20/2018   Procedure: POLYPECTOMY;  Surgeon: Danie Binder, MD;  Location: [facility];  Service: Endoscopy;;   wart removal  08/04/2019   benign    Social History   Socioeconomic  History   Marital status: Divorced    Spouse name: Not on file   Number of children: Not on file   Years of education: Not on file   Highest education level: Not on file  Occupational History   Not on file  Tobacco Use   Smoking status: Every Day    Packs/day: 0.25    Years: 20.00    Total pack years: 5.00    Types: Cigarettes   Smokeless tobacco: Never  Vaping Use   Vaping Use: Never used  Substance and Sexual Activity   Alcohol use: No   Drug use: No   Sexual activity: Not on file  Other Topics Concern   Not on file  Social History Narrative   Separated x 20 yrs, one daughter.  She lives with her.  Great niece and nephew live with her as well.   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   Tob: 7 pack year hx, quit 2010.   Alc: none   Exercise: none   Social Determinants of Health   Financial Resource Strain: Not on file  Food Insecurity: Not on file  Transportation Needs: Not on file  Physical Activity: Not  on file  Stress: Not on file  Social Connections: Not on file    Family History  Problem Relation Age of Onset   Cancer Mother        ovarian   COPD Father    Diabetes Father    Hyperlipidemia Father    Heart attack Father    Heart failure Father    Colon cancer Neg Hx     Outpatient Encounter Medications as of 07/11/2022  Medication Sig   albuterol (VENTOLIN HFA) 108 (90 Base) MCG/ACT inhaler INHALE 2 PUFFS BY MOUTH EVERY 6 HOURS AS NEEDED FOR COUGHING, WHEEZING, OR SHORTNESS OF BREATH (Patient not taking: Reported on 03/08/2022)   atorvastatin (LIPITOR) 20 MG tablet TAKE 1 Tablet BY MOUTH ONCE EVERY DAY   cetirizine (ZYRTEC) 10 MG tablet Take 10 mg by mouth daily.   cloNIDine (CATAPRES) 0.1 MG tablet Take 1 tablet (0.1 mg total) by mouth 2 (two) times daily.   gabapentin (NEURONTIN) 100 MG capsule Take 1 capsule (100 mg total) by mouth every 8 (eight) hours as needed.   glipiZIDE (GLUCOTROL) 5 MG tablet Take 1 tablet (5 mg total) by mouth daily before  breakfast.   hydrALAZINE (APRESOLINE) 10 MG tablet Take 1 tablet (10 mg total) by mouth 3 (three) times daily.   hydrochlorothiazide (HYDRODIURIL) 25 MG tablet Take 0.5 tablets (12.5 mg total) by mouth daily.   insulin glargine (LANTUS) 100 UNIT/ML injection Inject 0.8 mLs (80 Units total) into the skin at bedtime.   insulin glulisine (APIDRA) 100 UNIT/ML Solostar Pen Inject 10-16 Units into the skin 3 (three) times daily before meals.   losartan (COZAAR) 100 MG tablet TAKE 1 Tablet BY MOUTH ONCE EVERY DAY   metFORMIN (GLUCOPHAGE) 1000 MG tablet Take 1 tablet (1,000 mg total) by mouth 2 (two) times daily with a meal.   metoprolol tartrate (LOPRESSOR) 100 MG tablet Take 1 tablet (100 mg total) by mouth 2 (two) times daily.   pantoprazole (PROTONIX) 40 MG tablet Take 1 tablet (40 mg total) by mouth daily before breakfast.   [DISCONTINUED] glipiZIDE (GLUCOTROL) 5 MG tablet Take 1 tablet (5 mg total) by mouth daily before breakfast. (Patient not taking: Reported on 07/11/2022)   [DISCONTINUED] insulin glulisine (APIDRA) 100 UNIT/ML Solostar Pen Inject 10-16 Units into the skin 3 (three) times daily before meals.   No facility-administered encounter medications on file as of 07/11/2022.    ALLERGIES: Allergies  Allergen Reactions   Amlodipine Swelling   Januvia [Sitagliptin] Other (See Comments)    Caused pancreatitis    VACCINATION STATUS: Immunization History  Administered Date(s) Administered   Tdap 02/17/2016    Diabetes She presents for her follow-up diabetic visit. She has type 2 diabetes mellitus. Onset time: She was diagnosed at approximate age of 35 years.  She was previously seen in this clinic prior to 2018. Her disease course has been worsening. There are no hypoglycemic associated symptoms. Pertinent negatives for hypoglycemia include no confusion, headaches, pallor or seizures. Associated symptoms include fatigue, polydipsia and polyuria. Pertinent negatives for diabetes include  no chest pain and no polyphagia. There are no hypoglycemic complications. Symptoms are worsening. (Noncompliance.) Risk factors for coronary artery disease include diabetes mellitus, dyslipidemia, hypertension, tobacco exposure, post-menopausal, sedentary lifestyle, family history and obesity. Current diabetic treatment includes insulin injections. Her weight is fluctuating minimally. She is following a generally unhealthy diet. When asked about meal planning, she reported none. She has not had a previous visit with a dietitian. She never participates in exercise.  Her home blood glucose trend is increasing steadily. Her breakfast blood glucose range is generally >200 mg/dl. Her bedtime blood glucose range is generally >200 mg/dl. Her overall blood glucose range is >200 mg/dl. (She presents with her meter showing loss of control with average blood glucose of 331 over the last 7 days and her point-of-care A1c of 10.9%, increasing from 10.4% during last visit.  She ran out of her Apidra and glipizide and did not call for refill.    She did not document any hypoglycemia.     ) An ACE inhibitor/angiotensin II receptor blocker is being taken. Eye exam is current.  Hyperlipidemia This is a chronic problem. The current episode started more than 1 year ago. The problem is uncontrolled. Exacerbating diseases include diabetes and obesity. Pertinent negatives include no chest pain, myalgias or shortness of breath. Current antihyperlipidemic treatment includes statins. Risk factors for coronary artery disease include diabetes mellitus, dyslipidemia, hypertension, a sedentary lifestyle, post-menopausal, obesity and family history.  Hypertension This is a chronic problem. The current episode started more than 1 year ago. The problem is controlled. Pertinent negatives include no chest pain, headaches, palpitations or shortness of breath. Risk factors for coronary artery disease include diabetes mellitus, dyslipidemia, family  history, obesity, post-menopausal state, sedentary lifestyle and smoking/tobacco exposure. Past treatments include angiotensin blockers.     Review of Systems  Constitutional:  Positive for fatigue. Negative for chills, fever and unexpected weight change.  HENT:  Negative for trouble swallowing and voice change.   Eyes:  Negative for visual disturbance.  Respiratory:  Negative for cough, shortness of breath and wheezing.   Cardiovascular:  Negative for chest pain, palpitations and leg swelling.  Gastrointestinal:  Negative for diarrhea, nausea and vomiting.  Endocrine: Positive for polydipsia and polyuria. Negative for cold intolerance, heat intolerance and polyphagia.  Musculoskeletal:  Negative for arthralgias and myalgias.  Skin:  Negative for color change, pallor, rash and wound.  Neurological:  Negative for seizures and headaches.  Psychiatric/Behavioral:  Negative for confusion and suicidal ideas.     Objective:       07/11/2022    3:22 PM 04/10/2022    3:03 PM 03/27/2022    3:28 PM  Vitals with BMI  Height 5' 5"$  5' 5"$  5' 5"$   Weight 219 lbs 6 oz 226 lbs 13 oz 223 lbs 13 oz  BMI 36.51 [id number]  Systolic 123456 [id number]  Diastolic 72 78 72  Pulse 84 68 60    BP (!) 154/72 Comment: BP 140/74 L arm with manuel cuff  Pulse 84   Ht 5' 5"$  (1.651 m)   Wt 219 lb 6.4 oz (99.5 kg)   BMI 36.51 kg/m   Wt Readings from Last 3 Encounters:  07/11/22 219 lb 6.4 oz (99.5 kg)  04/10/22 226 lb 12.8 oz (102.9 kg)  03/27/22 223 lb 12.8 oz (101.5 kg)      CMP ( most recent) CMP     Component Value Date/Time   NA 141 03/08/2022 1230   K 3.9 03/08/2022 1230   CL 106 03/08/2022 1230   CO2 26 03/08/2022 1230   GLUCOSE 126 (H) 03/08/2022 1230   BUN 11 03/08/2022 1230   CREATININE 0.70 03/08/2022 1230   CREATININE 0.66 08/27/2016 1521   CALCIUM 9.1 03/08/2022 1230   PROT 7.5 03/08/2022 1230   ALBUMIN 3.4 (L) 03/08/2022 1230   AST 24 03/08/2022 1230   ALT 18 03/08/2022 1230    ALKPHOS 81 03/08/2022 1230  BILITOT 0.6 03/08/2022 1230   GFRNONAA >60 03/08/2022 1230   GFRAA >60 02/09/2020 1054     Diabetic Labs (most recent): Lab Results  Component Value Date   HGBA1C 10.9 (A) 07/11/2022   HGBA1C 10.4 (A) 03/27/2022   HGBA1C 8.4 (A) 09/28/2021   MICROALBUR 150 03/27/2022   MICROALBUR 59.3 (H) 10/02/2021   MICROALBUR 154.7 (H) 04/29/2020     Lipid Panel ( most recent) Lipid Panel     Component Value Date/Time   CHOL 231 (H) 03/08/2022 1230   TRIG 179 (H) 03/08/2022 1230   HDL 44 03/08/2022 1230   CHOLHDL 5.3 03/08/2022 1230   VLDL 36 03/08/2022 1230   LDLCALC 151 (H) 03/08/2022 1230   LDLDIRECT 119.0 01/18/2016 1038      Lab Results  Component Value Date   TSH 3.547 10/11/2020   TSH 1.23 01/18/2016   FREET4 0.69 10/11/2020      Assessment & Plan:   1. Uncontrolled type 2 diabetes mellitus with hyperglycemia [facility])   - Opal has currently uncontrolled symptomatic type 2 DM since  62 years of age.  She presents with her meter showing loss of control with average blood glucose of 331 over the last 7 days and her point-of-care A1c of 10.9%, increasing from 10.4% during last visit.  She ran out of her Apidra and glipizide and did not call for refill.    She did not document any hypoglycemia.    - I had a long discussion with her about the progressive nature of diabetes and the pathology behind its complications. -her diabetes is complicated by noncompliance/nonadherence, chronic smoking, obesity/sedentary life and she remains at a high risk for more acute and chronic complications which include CAD, CVA, CKD, retinopathy, and neuropathy. These are all discussed in detail with her.  - I have counseled her on diet  and weight management  by adopting a carbohydrate restricted/protein rich diet. Patient is encouraged to switch to  unprocessed or minimally processed     complex starch and increased protein intake (mostly plant source),  fruits, and vegetables. -  she is advised to stick to a routine mealtimes to eat 3 meals  a day and avoid unnecessary snacks ( to snack only to correct hypoglycemia).   She did not engage in lifestyle medicine, she promises to try again.  - she acknowledges that there is a room for improvement in her food and drink choices. - Suggestion is made for her to avoid simple carbohydrates  from her diet including Cakes, Sweet Desserts, Ice Cream, Soda (diet and regular), Sweet Tea, Candies, Chips, Cookies, Store Bought Juices, Alcohol , Artificial Sweeteners,  Coffee Creamer, and "Sugar-free" Products, Lemonade. This will help patient to have more stable blood glucose profile and potentially avoid unintended weight gain.  The following Lifestyle Medicine recommendations according to [facility]) were discussed and and offered to patient and she  agrees to start the journey:  A. Whole Foods, Plant-Based Nutrition comprising of fruits and vegetables, plant-based proteins, whole-grain carbohydrates was discussed in detail with the patient.   A list for source of those nutrients were also provided to the patient.  Patient will use only water or unsweetened tea for hydration. B.  The need to stay away from risky substances including alcohol, smoking; obtaining 7 to 9 hours of restorative sleep, at least 150 minutes of moderate intensity exercise weekly, the importance of healthy social connections,  and stress management techniques were  discussed. C.  A full color page of  Calorie density of various food groups per pound showing examples of each food groups was provided to the patient.   - I have approached her with the following individualized plan to manage  her diabetes and patient agrees:   -In light of her presentation with loss  of control , she is approached to resume her intensive treatment with basal/bolus insulin. - She is advised to continue Lantus 80 units nightly,  advised to resume Apidra 10-16   units 3 times a day with meals  for pre-meal BG readings of 90-152m/dl, plus patient specific correction dose for unexpected hyperglycemia above 1544mdl, associated with strict monitoring of glucose 4 times a day-before meals and at bedtime. - she is warned not to take insulin without proper monitoring per orders. - Adjustment parameters are given to her for hypo and hyperglycemia in writing. - she is encouraged to call clinic for blood glucose levels less than 70 or above 200 mg /dl. - she is advised to continue metformin 1000 mg p.o. twice daily, and glipizide 5 mg XL p.o. daily at breakfast.     - she is high risk for pancreatitis due to chronic heavy smoking, will avoid incretin therapy for now.   - Specific targets for  A1c;  LDL, HDL,  and Triglycerides were discussed with the patient.  2) Blood Pressure /Hypertension:   -Her blood pressure is not controlled to target.   she is advised to continue her current medications including amlodipine 10 mg p.o. daily, clonidine 0.1 mg p.o. twice daily.  Her ongoing smoking is working again is her efforts to control hypertension.    The patient was counseled on the dangers of tobacco use, and was advised to quit.  Reviewed strategies to maximize success, including removing cigarettes and smoking materials from environment.  3) Lipids/Hyperlipidemia:   Review of her recent lipid panel showed uncontrolled  LDL at  151, hypertriglyceridemia at 179. -  she  is advised to continue Atorvastatin 20 mg p.o. daily at bedtime.  Side effects and precautions discussed with her.    4)  Weight/Diet:  Body mass index is 36.51 kg/m.  -   clearly complicating her diabetes care.   she is  a candidate for weight loss. I discussed with her the fact that loss of 5 - 10% of her  current body weight will have the most impact on her diabetes management.  Exercise, and detailed carbohydrates information provided  -  detailed on discharge  instructions.  5) Chronic Care/Health Maintenance:  -she  is on ACEI/ARB and Statin medications and  is encouraged to initiate and continue to follow up with Ophthalmology, Dentist,  Podiatrist at least yearly or according to recommendations, and advised to  quit smoking. I have recommended yearly flu vaccine and pneumonia vaccine at least every 5 years; moderate intensity exercise for up to 150 minutes weekly; and  sleep for at least 7 hours a day.   Her recent point-of-care screening ABI was negative for PAD.  This study will be repeated in March 2027, or sooner if needed.  - she is  advised to maintain close follow up with BrLeonie DouglasMD for primary care needs, as well as her other providers for optimal and coordinated care.   I spent  26  minutes in the care of the patient today including review of labs from [facility], Thyroid Function, Hematology (current and previous including abstractions from other facilities); face-to-face time  discussing  her blood glucose readings/logs, discussing hypoglycemia and hyperglycemia episodes and symptoms, medications doses, her options of short and long term treatment based on the latest standards of care / guidelines;  discussion about incorporating lifestyle medicine;  and documenting the encounter. Risk reduction counseling performed per USPSTF guidelines to reduce  obesity and cardiovascular risk factors.     Please refer to Patient Instructions for Blood Glucose Monitoring and Insulin/Medications Dosing Guide"  in media tab for additional information. Please  also refer to " Patient Self Inventory" in the Media  tab for reviewed elements of pertinent patient history.  Gilbert participated in the discussions, expressed understanding, and voiced agreement with the above plans.  All questions were answered to her satisfaction. she is encouraged to contact clinic should she have any questions or concerns prior to her return  visit.     Follow up plan: - Return in about 3 months (around 10/09/2022) for F/U with Pre-visit Labs, Meter/CGM/Logs, A1c here.  Glade Lloyd, MD [facility] [address] [place], [place] [address] Phone: [phone]  Fax: [phone]    07/11/2022, 4:27 PM  This note was partially dictated with voice recognition software. Similar sounding words can be transcribed inadequately or may not  be corrected upon review.

## 2022-07-16 ENCOUNTER — Telehealth: Payer: Self-pay

## 2022-07-16 DIAGNOSIS — E1165 Type 2 diabetes mellitus with hyperglycemia: Secondary | ICD-10-CM

## 2022-07-16 MED ORDER — INSULIN GLULISINE 100 UNIT/ML SOLOSTAR PEN: 14.0000 [IU] | PEN_INJECTOR | Freq: Three times a day (TID) | SUBCUTANEOUS | 1 refills | Status: DC

## 2022-07-16 MED ORDER — INSULIN GLARGINE 100 UNIT/ML ~~LOC~~ SOLN: 80.0000 [IU] | Freq: Every day | SUBCUTANEOUS | 2 refills | Status: DC

## 2022-07-16 NOTE — Telephone Encounter (Signed)
Discussed with pt, understanding voiced. Rx's for lantus 80 units qhs and apidra 14-20 units tid ac sent to [facility].

## 2022-07-16 NOTE — Telephone Encounter (Signed)
Pt called with high BG readings.   Date Before breakfast Before lunch Before supper Bedtime  2/24 Pt states >250 throughout the day     2/25 295 >250    2/26 395 280 at 3:00p.m.            Pt taking: glipizide '5mg'$  qd, stated she had been out for 2-3 wks but had been taking for the past week, lantus 80 units qhs, apidra 10-16 units tid and metformin '1000mg'$  bid.

## 2022-07-19 ENCOUNTER — Encounter: Payer: Self-pay | Admitting: Radiology

## 2022-07-31 NOTE — Congregational Nurse Program (Signed)
Client into [facility] to renew [employer]. She was last seen at [facility] last November and has not been rescheduled since her provider Dr. Marlon Pel left in December.  She is DM and was previously controlled until 03/2022. Last A1C 10.9 on 07/11/22 she is a patient with Dr Dorris Fetch at [facility]. Client reports she has had some stress and admits to overeating when stressed.  While client is here we contacted [place] and appointment to establish with Suzzanne Cloud Scheduled for 08/28/22 at 2:30 PM client given appointment , date and new address and phone number. Introduced client to [employer]. Will refer to SW interns for follow up. She is agreeable. Also provided client with my business card and discussed that I will follow up with her for her Diabetes. She is agreeable.  Debria Garret RN [facility].

## 2022-08-20 NOTE — H&P (Signed)
Surgical History & Physical  Patient Name: Casey Johnston DOB: 14-Sep-1960  Surgery: Cataract extraction with intraocular lens implant phacoemulsification; Left Eye  Surgeon: Baruch Goldmann MD Surgery Date:  08-24-22 Pre-Op Date:  08-16-22  HPI: A 89 Yr. old female patient 1. 1. The patient complains of difficulty when driving due to glare from headlights or sun, which began many years ago. Both eyes are affected OS>OD. The episode is constant. The condition's severity is worsening. Difficulties seeing fine print like on medicine bottles. This is negatively affecting the patient's quality of life and the patient is unable to function adequately in life with the current level of vision. HPI was performed by Baruch Goldmann .  Medical History: Macula Degeneration Glaucoma Diabetes - DM Type 2 High Blood Pressure  Review of Systems Negative Allergic/Immunologic Negative Cardiovascular Negative Constitutional Negative Ear, Nose, Mouth & Throat Negative Endocrine Negative Eyes Negative Gastrointestinal Negative Genitourinary Negative Hemotologic/Lymphatic Negative Integumentary Negative Musculoskeletal Negative Neurological Negative Psychiatry Negative Respiratory  Social   Smoker, current status unknown   Medication Meloxicam, Hydrolazine, Losartan, Atorvastatin, Clonidine, Lantus, Metformin,   Sx/Procedures Hysterectomy,   Drug Allergies  JANUVIA,   History & Physical: Heent: cataract, left eye NECK: supple without bruits LUNGS: lungs clear to auscultation CV: regular rate and rhythm Abdomen: soft and non-tender Impression & Plan: Assessment: 1.  NUCLEAR SCLEROSIS AGE RELATED; Both Eyes (H25.13) 2.  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  DM Type 2; Right Mod Without ME [id number]) 4.  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  BLEPHARITIS; Right Upper Lid, Right Lower Lid, Left Upper Lid, Left Lower Lid (H01.001, H01.002,H01.004,H01.005) 6.   DERMATOCHALASIS, no surgery; Right Upper Lid, Left Upper Lid (H02.831, H02.834) 7.  ASTIGMATISM, REGULAR; Both Eyes (H52.223)  Plan: 1.  Cataract accounts for the patient's decreased vision. This visual impairment is not correctable with a tolerable change in glasses or contact lenses. Cataract surgery with an implantation of a new lens should significantly improve the visual and functional status of the patient. Discussed all risks, benefits, alternatives, and potential complications. Discussed the procedures and recovery. Patient desires to have surgery. A-scan ordered and performed today for intra-ocular lens calculations. The surgery will be performed in order to improve vision for driving, reading, and for eye examinations. Recommend phacoemulsification with intra-ocular lens. Recommend Dextenza for post-operative pain and inflammation. Left eye worse - first. Dilates well - shugarcaine by protocol. Recommend toric IOL (OS only).  2.  Based on cup-to-disc ratio. Positive family history. African-American. OCT rNFL today shows: WNL OU. Detailed discussion about glaucoma today including importance of maintaining good follow up and following treatment plan, and the possibility of irreversible blindness as part of this disease process.  3.  Stressed importance of blood sugar and blood pressure control, and also yearly eye examinations. Discussed the need for ongoing proactive ocular exams and treatment, hopefully before visual symptoms develop.  4.  onset 2 years ago. Worsening diplopia (intermittent). No loss of function on eye movements. May require strabismus surgery in future.  5.  Recommend regular lid cleaning.  6.  Asymptomatic, recommend observation for now. Findings, prognosis and treatment options reviewed.  7.  Worse OS. Recommend toric IOL OS.

## 2022-08-22 ENCOUNTER — Encounter (HOSPITAL_COMMUNITY)
Admission: RE | Admit: 2022-08-22 | Discharge: 2022-08-22 | Disposition: A | Discharge status: Home / Self Care | Source: Ambulatory Visit | Attending: Ophthalmology | Admitting: Ophthalmology

## 2022-08-22 ENCOUNTER — Encounter (HOSPITAL_COMMUNITY): Payer: Self-pay

## 2022-08-22 ENCOUNTER — Other Ambulatory Visit: Payer: Self-pay

## 2022-08-22 NOTE — Pre-Procedure Instructions (Signed)
Pre op phone call done. Patient states, "I am very sensitive about my eyes and I was told to ask for anesthesia". Explained to her that she would talk to the anesthesiologist that morning. Note also placed on front of chart.

## 2022-08-24 ENCOUNTER — Encounter (HOSPITAL_COMMUNITY): Payer: Self-pay | Admitting: Ophthalmology

## 2022-08-24 ENCOUNTER — Ambulatory Visit (HOSPITAL_COMMUNITY)
Admission: RE | Admit: 2022-08-24 | Discharge: 2022-08-24 | Disposition: A | Discharge status: Home / Self Care | Attending: Ophthalmology | Admitting: Ophthalmology

## 2022-08-24 ENCOUNTER — Encounter (HOSPITAL_COMMUNITY)
Admission: RE | Disposition: A | Discharge status: Home / Self Care | Payer: Self-pay | Source: Home / Self Care | Attending: Ophthalmology

## 2022-08-24 ENCOUNTER — Ambulatory Visit (HOSPITAL_COMMUNITY): Admitting: Anesthesiology

## 2022-08-24 ENCOUNTER — Ambulatory Visit (HOSPITAL_BASED_OUTPATIENT_CLINIC_OR_DEPARTMENT_OTHER): Admitting: Anesthesiology

## 2022-08-24 DIAGNOSIS — Z79899 Other long term (current) drug therapy: Secondary | ICD-10-CM | POA: Diagnosis not present

## 2022-08-24 DIAGNOSIS — H25812 Combined forms of age-related cataract, left eye: Secondary | ICD-10-CM

## 2022-08-24 DIAGNOSIS — Z7984 Long term (current) use of oral hypoglycemic drugs: Secondary | ICD-10-CM | POA: Insufficient documentation

## 2022-08-24 DIAGNOSIS — E119 Type 2 diabetes mellitus without complications: Secondary | ICD-10-CM | POA: Diagnosis not present

## 2022-08-24 DIAGNOSIS — Z794 Long term (current) use of insulin: Secondary | ICD-10-CM | POA: Insufficient documentation

## 2022-08-24 DIAGNOSIS — F17210 Nicotine dependence, cigarettes, uncomplicated: Secondary | ICD-10-CM | POA: Diagnosis not present

## 2022-08-24 DIAGNOSIS — I10 Essential (primary) hypertension: Secondary | ICD-10-CM | POA: Insufficient documentation

## 2022-08-24 DIAGNOSIS — F418 Other specified anxiety disorders: Secondary | ICD-10-CM | POA: Insufficient documentation

## 2022-08-24 DIAGNOSIS — E1136 Type 2 diabetes mellitus with diabetic cataract: Secondary | ICD-10-CM | POA: Diagnosis present

## 2022-08-24 DIAGNOSIS — H52202 Unspecified astigmatism, left eye: Secondary | ICD-10-CM | POA: Diagnosis not present

## 2022-08-24 HISTORY — PX: CATARACT EXTRACTION W/PHACO: SHX586

## 2022-08-24 LAB — GLUCOSE, CAPILLARY: Glucose-Capillary: 122 mg/dL — ABNORMAL HIGH (ref 70–99)

## 2022-08-24 SURGERY — PHACOEMULSIFICATION, CATARACT, WITH IOL INSERTION
Anesthesia: Monitor Anesthesia Care | Site: Eye | Laterality: Left

## 2022-08-24 MED ORDER — PHENYLEPHRINE HCL 2.5 % OP SOLN
1.0000 [drp] | OPHTHALMIC | Status: AC | PRN
  Administered 2022-08-24 (×3): 1 [drp] via OPHTHALMIC

## 2022-08-24 MED ORDER — TROPICAMIDE 1 % OP SOLN
1.0000 [drp] | OPHTHALMIC | Status: AC | PRN
  Administered 2022-08-24 (×3): 1 [drp] via OPHTHALMIC

## 2022-08-24 MED ORDER — LIDOCAINE HCL 3.5 % OP GEL: 1.0000 | Freq: Once | OPHTHALMIC | Status: DC

## 2022-08-24 MED ORDER — SODIUM CHLORIDE 0.9% FLUSH
INTRAVENOUS | Status: DC | PRN
  Administered 2022-08-24: 5 mL via INTRAVENOUS

## 2022-08-24 MED ORDER — LIDOCAINE HCL (PF) 1 % IJ SOLN
INTRAOCULAR | Status: DC | PRN
  Administered 2022-08-24: 1 mL via OPHTHALMIC

## 2022-08-24 MED ORDER — TETRACAINE HCL 0.5 % OP SOLN
1.0000 [drp] | OPHTHALMIC | Status: AC | PRN
  Administered 2022-08-24 (×3): 1 [drp] via OPHTHALMIC

## 2022-08-24 MED ORDER — NEOMYCIN-POLYMYXIN-DEXAMETH 3.5-10000-0.1 OP SUSP
OPHTHALMIC | Status: DC | PRN
  Administered 2022-08-24: 1 [drp] via OPHTHALMIC

## 2022-08-24 MED ORDER — POVIDONE-IODINE 5 % OP SOLN
OPHTHALMIC | Status: DC | PRN
  Administered 2022-08-24: 1 via OPHTHALMIC

## 2022-08-24 MED ORDER — SODIUM HYALURONATE 23MG/ML IO SOSY
PREFILLED_SYRINGE | INTRAOCULAR | Status: DC | PRN
  Administered 2022-08-24: .6 mL via INTRAOCULAR

## 2022-08-24 MED ORDER — BSS IO SOLN
INTRAOCULAR | Status: DC | PRN
  Administered 2022-08-24: 15 mL via INTRAOCULAR

## 2022-08-24 MED ORDER — MIDAZOLAM HCL 2 MG/2ML IJ SOLN
INTRAMUSCULAR | Status: DC | PRN
  Administered 2022-08-24: 2 mg via INTRAVENOUS

## 2022-08-24 MED ORDER — MIDAZOLAM HCL 2 MG/2ML IJ SOLN
INTRAMUSCULAR | Status: AC
  Filled 2022-08-24: qty 2

## 2022-08-24 MED ORDER — STERILE WATER FOR IRRIGATION IR SOLN
Status: DC | PRN
  Administered 2022-08-24: 250 mL

## 2022-08-24 MED ORDER — SODIUM HYALURONATE 10 MG/ML IO SOLUTION
PREFILLED_SYRINGE | INTRAOCULAR | Status: DC | PRN
  Administered 2022-08-24: .85 mL via INTRAOCULAR

## 2022-08-24 MED ORDER — EPINEPHRINE PF 1 MG/ML IJ SOLN
INTRAMUSCULAR | Status: AC
  Filled 2022-08-24: qty 1

## 2022-08-24 MED ORDER — EPINEPHRINE PF 1 MG/ML IJ SOLN
INTRAOCULAR | Status: DC | PRN
  Administered 2022-08-24: 500 mL

## 2022-08-24 SURGICAL SUPPLY — 14 items
CATARACT SUITE SIGHTPATH (MISCELLANEOUS) ×1 IMPLANT
CLOTH BEACON ORANGE TIMEOUT ST (SAFETY) ×2 IMPLANT
EYE SHIELD UNIVERSAL CLEAR (GAUZE/BANDAGES/DRESSINGS) IMPLANT
FEE CATARACT SUITE SIGHTPATH (MISCELLANEOUS) ×2 IMPLANT
GLOVE BIOGEL PI IND STRL 7.0 (GLOVE) ×4 IMPLANT
LENS IOL EYHANCE TORIC II 21.5 ×1 IMPLANT
LENS IOL EYHANCE TRC 150 21.5 IMPLANT
LENS IOL EYHNC TORIC 150 21.5 ×1 IMPLANT
NDL HYPO 18GX1.5 BLUNT FILL (NEEDLE) ×2 IMPLANT
NEEDLE HYPO 18GX1.5 BLUNT FILL (NEEDLE) ×1 IMPLANT
PAD ARMBOARD 7.5X6 YLW CONV (MISCELLANEOUS) ×2 IMPLANT
SYR TB 1ML LL NO SAFETY (SYRINGE) ×2 IMPLANT
TAPE SURG TRANSPORE 1 IN (GAUZE/BANDAGES/DRESSINGS) IMPLANT
WATER STERILE IRR 250ML POUR (IV SOLUTION) ×2 IMPLANT

## 2022-08-24 NOTE — Anesthesia Preprocedure Evaluation (Signed)
Anesthesia Evaluation  Patient identified by MRN, date of birth, ID band Patient awake    Airway Mallampati: II  TM Distance: >3 FB Neck ROM: Full    Dental  (+) Teeth Intact   Pulmonary sleep apnea , Current Smoker and Patient abstained from smoking. Seasonal allergies   breath sounds clear to auscultation       Cardiovascular hypertension, Pt. on medications  Rhythm:Regular     Neuro/Psych  Headaches PSYCHIATRIC DISORDERS Anxiety Depression       GI/Hepatic   Endo/Other  diabetes    Renal/GU      Musculoskeletal   Abdominal   Peds  Hematology   Anesthesia Other Findings   Reproductive/Obstetrics                             Anesthesia Physical Anesthesia Plan  ASA: 2  Anesthesia Plan: MAC   Post-op Pain Management:    Induction:   PONV Risk Score and Plan:   Airway Management Planned:   Additional Equipment:   Intra-op Plan:   Post-operative Plan:   Informed Consent:      Dental advisory given  Plan Discussed with: Anesthesiologist  Anesthesia Plan Comments:        Anesthesia Quick Evaluation

## 2022-08-24 NOTE — Anesthesia Postprocedure Evaluation (Signed)
Anesthesia Post Note  Patient: Sudan F Scheier  Procedure(s) Performed: CATARACT EXTRACTION PHACO AND INTRAOCULAR LENS PLACEMENT (IOC) (Left: Eye)  Patient location during evaluation: Phase II Anesthesia Type: MAC Level of consciousness: awake Pain management: pain level controlled Vital Signs Assessment: post-procedure vital signs reviewed and stable Respiratory status: spontaneous breathing and respiratory function stable Cardiovascular status: blood pressure returned to baseline and stable Postop Assessment: no headache and no apparent nausea or vomiting Anesthetic complications: no Comments: Late entry   No notable events documented.   Last Vitals:  Vitals:   08/24/22 0749 08/24/22 0916  BP: (!) 146/58 (!) 126/55  Pulse: 68 73  Resp: 15 (!) 23  Temp: 37.1 C 37.2 C  SpO2: 98% 99%    Last Pain:  Vitals:   08/24/22 0916  TempSrc: Oral  PainSc: 0-No pain                 Windell Norfolk

## 2022-08-24 NOTE — Transfer of Care (Signed)
Immediate Anesthesia Transfer of Care Note  Patient: Casey Johnston  Procedure(s) Performed: CATARACT EXTRACTION PHACO AND INTRAOCULAR LENS PLACEMENT (IOC) (Left: Eye)  Patient Location: Short Stay  Anesthesia Type:MAC  Level of Consciousness: awake  Airway & Oxygen Therapy: Patient Spontanous Breathing  Post-op Assessment: Report given to RN and Post -op Vital signs reviewed and stable  Post vital signs: Reviewed and stable  Last Vitals:  Vitals Value Taken Time  BP 126/55 08/24/22 0916  Temp 37.2 C 08/24/22 0916  Pulse 73 08/24/22 0916  Resp 23 08/24/22 0916  SpO2 99 % 08/24/22 0916    Last Pain:  Vitals:   08/24/22 0916  TempSrc: Oral  PainSc: 0-No pain         Complications: No notable events documented.

## 2022-08-24 NOTE — Anesthesia Procedure Notes (Signed)
Date/Time: 08/24/2022 8:55 AM  Performed by: Franco Nones, CRNAPre-anesthesia Checklist: Patient identified, Emergency Drugs available, Suction available, Timeout performed and Patient being monitored Patient Re-evaluated:Patient Re-evaluated prior to induction Oxygen Delivery Method: Nasal Cannula

## 2022-08-24 NOTE — Op Note (Signed)
Date of procedure: 08/24/22  Pre-operative diagnosis: Visually significant age-related combined cataract, Left Eye; Visually Significant Astigmatism, Left Eye (H25.812)  Post-operative diagnosis: Visually significant age-related cataract, Left Eye; Visually Significant Astigmatism, Left Eye  Procedure: Removal of cataract via phacoemulsification and insertion of intra-ocular lens Laural Benes and Johnson DIU150 +21.5D into the capsular bag of the Left Eye  Attending surgeon: Rudy Jew. Umer Harig, MD, MA  Anesthesia: MAC, Topical Akten  Complications: None  Estimated Blood Loss: <27mL (minimal)  Specimens: None  Implants: As above  Indications:  Visually significant age-related cataract, Left Eye; Visually Significant Astigmatism, Left Eye  Procedure:  The patient was seen and identified in the pre-operative area. The operative eye was identified and dilated.  The operative eye was marked.  Pre-operative toric markers were used to mark the eye at 0 and 180 degrees. Topical anesthesia was administered to the operative eye.     The patient was then to the operative suite and placed in the supine position.  A timeout was performed confirming the patient, procedure to be performed, and all other relevant information.   The patient's face was prepped and draped in the usual fashion for intra-ocular surgery.  A lid speculum was placed into the operative eye and the surgical microscope moved into place and focused.  A superotemporal paracentesis was created using a 20 gauge paracentesis blade.  Shugarcaine was injected into the anterior chamber.  Viscoelastic was injected into the anterior chamber.  A temporal clear-corneal main wound incision was created using a 2.51mm microkeratome.  A continuous curvilinear capsulorrhexis was initiated using an irrigating cystitome and completed using capsulorrhexis forceps.  Hydrodissection and hydrodeliniation were performed.  Viscoelastic was injected into the anterior  chamber.  A phacoemulsification handpiece and a chopper as a second instrument were used to remove the nucleus and epinucleus. The irrigation/aspiration handpiece was used to remove any remaining cortical material.   The capsular bag was reinflated with viscoelastic, checked, and found to be intact.  The eye was marked to the per-op meridian.  The intraocular lens was inserted into the capsular bag and dialed into place using a Kuglen hook to 151 degrees.  The irrigation/aspiration handpiece was used to remove any remaining viscoelastic.  The clear corneal wound and paracentesis wounds were then hydrated and checked with Weck-Cels to be watertight. Maxitrol drops were placed on the eye. The lid-speculum and drape was removed, and the patient's face was cleaned with a wet and dry 4x4.   A clear shield was taped over the eye. The patient was taken to the post-operative care unit in good condition, having tolerated the procedure well.  Post-Op Instructions: The patient will follow up at [facility] for a same day post-operative evaluation and will receive all other orders and instructions.

## 2022-08-24 NOTE — Discharge Instructions (Signed)
Please discharge patient when stable, will follow up today with Dr. Raman Featherston at the [facility] [place] office immediately following discharge.  Leave shield in place until visit.  All paperwork with discharge instructions will be given at the office.  [facility] Address:  [address]  [place], [place] [address]  

## 2022-08-24 NOTE — Interval H&P Note (Signed)
History and Physical Interval Note:  08/24/2022 8:46 AM  Casey Johnston  has presented today for surgery, with the diagnosis of nuclear sclerosis age related cataract; left.  The various methods of treatment have been discussed with the patient and family. After consideration of risks, benefits and other options for treatment, the patient has consented to  Procedure(s) with comments: CATARACT EXTRACTION PHACO AND INTRAOCULAR LENS PLACEMENT (IOC) (Left) - CDE as a surgical intervention.  The patient's history has been reviewed, patient examined, no change in status, stable for surgery.  I have reviewed the patient's chart and labs.  Questions were answered to the patient's satisfaction.     Fabio Pierce

## 2022-08-31 ENCOUNTER — Encounter (HOSPITAL_COMMUNITY): Payer: Self-pay | Admitting: Ophthalmology

## 2022-09-03 NOTE — Progress Notes (Unsigned)
Referring Provider: No ref. provider found Primary Care Physician:  Waldon Reining, MD Primary GI Physician: Dr. Marletta Lor  No chief complaint on file.   HPI:   Casey Johnston is a 62 y.o. female with history of GERD, constipation, chronic abdominal pain that seems to be associated with underlying constipation, likely IBS-C.  Previously failed Amitiza 24 mcg twice daily and MiraLAX daily. TCS and EGD in June 2020. TCS with 4 tubular adenomas, diverticulosis in the sigmoid colon, tortuous left colon, external and internal hemorrhoids, currently overdue for surveillance colonoscopy. EGD with gastritis and duodenitis. No H. Pylori. Also with history of 1.2 cm subcutaneous nodule in the right lower anterior abdominal wall noted on CT in May 2020 s/p biopsy revealing benign epidermoid cyst.  She is presenting today for follow-up and to discuss scheduling colonoscopy ***.   Last seen in our office 03/08/2022.  GERD well-controlled on pantoprazole 40 mg daily.  Constipation well-controlled on Linzess 72 mcg daily.  Prior abdominal pain resolved.  Requested to hold off on colonoscopy in the setting of uncontrolled diabetes.  She was following with Dr. Fransico Him closely for diabetes management.  Recommended continuing current medications, follow-up in 6 months.  Last hemoglobin A1c 10.9 on 07/11/2022.  Dr. Fransico Him restarted mealtime insulin at that time.  Today:     Past Medical History:  Diagnosis Date   Anxiety and depression    Chronic constipation    Diabetes mellitus, type II    Dr. Fransico Him managing   Frequent headaches chronic   ibuprofen helps some   GERD (gastroesophageal reflux disease)    History of UTI    Recurrent per pt (approx 3 per year)   Hyperlipidemia    Hypertension    Leukocytosis 12/2015   mild lymphocytosis.  Path smear review reassuring.  Repeat 01/2016 stable.   Morbid obesity    OSA on CPAP    not using CPAP anymore    Past Surgical History:  Procedure Laterality  Date   ABDOMINAL HYSTERECTOMY  1998   Dysfunctional uterine bleeding.  No hx of abnormal paps.   BIOPSY  10/20/2018   Procedure: BIOPSY;  Surgeon: West Bali, MD;  Location: [facility];  Service: Endoscopy;;  stomach   CATARACT EXTRACTION W/PHACO Left 08/24/2022   Procedure: CATARACT EXTRACTION PHACO AND INTRAOCULAR LENS PLACEMENT (IOC);  Surgeon: Fabio Pierce, MD;  Location: [facility] ORS;  Service: Ophthalmology;  Laterality: Left;  CDE 8.53   COLONOSCOPY  2012   Dr. Samuella Cota: 1.5 cm pendunculated descending colon polyp (tubulovillous adenoma) and diminutive hyperplastic polyps   COLONOSCOPY N/A 10/20/2018   Procedure: COLONOSCOPY;  Surgeon: West Bali, MD; 4 tubular adenomas, diverticulosis in the sigmoid colon, tortuous left colon, external and internal hemorrhoids. Due for repeat in 2023.    ESOPHAGOGASTRODUODENOSCOPY N/A 10/20/2018   Procedure: ESOPHAGOGASTRODUODENOSCOPY (EGD);  Surgeon: West Bali, MD;  gastritis and duodenitis. No H. Pylori.    POLYPECTOMY  10/20/2018   Procedure: POLYPECTOMY;  Surgeon: West Bali, MD;  Location: [facility];  Service: Endoscopy;;   wart removal  08/04/2019   benign    Current Outpatient Medications  Medication Sig Dispense Refill   albuterol (VENTOLIN HFA) 108 (90 Base) MCG/ACT inhaler INHALE 2 PUFFS BY MOUTH EVERY 6 HOURS AS NEEDED FOR COUGHING, WHEEZING, OR SHORTNESS OF BREATH (Patient not taking: Reported on 03/08/2022) 20.1 g 0   atorvastatin (LIPITOR) 20 MG tablet TAKE 1 Tablet BY MOUTH ONCE EVERY DAY 30 tablet 0  cetirizine (ZYRTEC) 10 MG tablet Take 10 mg by mouth daily.     cloNIDine (CATAPRES) 0.1 MG tablet Take 1 tablet (0.1 mg total) by mouth 2 (two) times daily. 60 tablet 0   gabapentin (NEURONTIN) 100 MG capsule Take 1 capsule (100 mg total) by mouth every 8 (eight) hours as needed. 90 capsule 1   glipiZIDE (GLUCOTROL) 5 MG tablet Take 1 tablet (5 mg total) by mouth daily before breakfast. 90 tablet 1   hydrALAZINE  (APRESOLINE) 10 MG tablet Take 1 tablet (10 mg total) by mouth 3 (three) times daily. 90 tablet 0   hydrochlorothiazide (HYDRODIURIL) 25 MG tablet Take 0.5 tablets (12.5 mg total) by mouth daily. 15 tablet 0   insulin glargine (LANTUS) 100 UNIT/ML injection Inject 0.8 mLs (80 Units total) into the skin at bedtime. 30 mL 2   insulin glulisine (APIDRA) 100 UNIT/ML Solostar Pen Inject 14-20 Units into the skin 3 (three) times daily before meals. 30 mL 1   losartan (COZAAR) 100 MG tablet TAKE 1 Tablet BY MOUTH ONCE EVERY DAY 90 tablet 0   metFORMIN (GLUCOPHAGE) 1000 MG tablet Take 1 tablet (1,000 mg total) by mouth 2 (two) times daily with a meal. 60 tablet 0   metoprolol tartrate (LOPRESSOR) 100 MG tablet Take 1 tablet (100 mg total) by mouth 2 (two) times daily. 60 tablet 0   pantoprazole (PROTONIX) 40 MG tablet Take 1 tablet (40 mg total) by mouth daily before breakfast. 30 tablet 11   No current facility-administered medications for this visit.    Allergies as of 09/06/2022 - Review Complete 08/24/2022  Allergen Reaction Noted   Amlodipine Swelling 10/19/2020   Januvia [sitagliptin] Other (See Comments) 05/21/2018    Family History  Problem Relation Age of Onset   Cancer Mother        ovarian   COPD Father    Diabetes Father    Hyperlipidemia Father    Heart attack Father    Heart failure Father    Colon cancer Neg Hx     Social History   Socioeconomic History   Marital status: Divorced    Spouse name: Not on file   Number of children: Not on file   Years of education: Not on file   Highest education level: Not on file  Occupational History   Not on file  Tobacco Use   Smoking status: Every Day    Packs/day: 0.25    Years: 20.00    Additional pack years: 0.00    Total pack years: 5.00    Types: Cigarettes   Smokeless tobacco: Never  Vaping Use   Vaping Use: Never used  Substance and Sexual Activity   Alcohol use: No   Drug use: No   Sexual activity: Not on file   Other Topics Concern   Not on file  Social History Narrative   Separated x 20 yrs, one daughter.  She lives with her.  Great niece and nephew live with her as well.   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   Tob: 7 pack year hx, quit 2010.   Alc: none   Exercise: none   Social Determinants of Health   Financial Resource Strain: Not on file  Food Insecurity: Not on file  Transportation Needs: Not on file  Physical Activity: Not on file  Stress: Not on file  Social Connections: Not on file    Review of Systems: Gen: Denies fever, chills, anorexia. Denies fatigue, weakness, weight loss.  CV: Denies  chest pain, palpitations, syncope, peripheral edema, and claudication. Resp: Denies dyspnea at rest, cough, wheezing, coughing up blood, and pleurisy. GI: Denies vomiting blood, jaundice, and fecal incontinence.   Denies dysphagia or odynophagia. Derm: Denies rash, itching, dry skin Psych: Denies depression, anxiety, memory loss, confusion. No homicidal or suicidal ideation.  Heme: Denies bruising, bleeding, and enlarged lymph nodes.  Physical Exam: There were no vitals taken for this visit. General:   Alert and oriented. No distress noted. Pleasant and cooperative.  Head:  Normocephalic and atraumatic. Eyes:  Conjuctiva clear without scleral icterus. Heart:  S1, S2 present without murmurs appreciated. Lungs:  Clear to auscultation bilaterally. No wheezes, rales, or rhonchi. No distress.  Abdomen:  +BS, soft, non-tender and non-distended. No rebound or guarding. No HSM or masses noted. Msk:  Symmetrical without gross deformities. Normal posture. Extremities:  Without edema. Neurologic:  Alert and  oriented x4 Psych:  Normal mood and affect.    Assessment:     Plan:  ***   Ermalinda Memos, PA-C [facility] 09/06/2022

## 2022-09-04 NOTE — H&P (Signed)
Surgical History & Physical  Patient Name: Casey Johnston DOB: 1960-07-24  Surgery: Cataract extraction with intraocular lens implant phacoemulsification; Right Eye  Surgeon: Fabio Pierce MD Surgery Date:  09-07-22 Pre-Op Date:  08-30-22  HPI: A 70 Yr. old female patient present for 1 week post op OS. Patient states the vision isn't as good as she was hoping. Using Combo gtt TID OS. Patient is having difficulties with glare when driving on bright sunny days. Difficulties reading fine print like on medicine bottles. This This is negatively affecting the patient's quality of life and the patient is unable to function adequately in life with the current level of vision. HPI was performed by Fabio Pierce .  Medical History: Macula Degeneration Glaucoma Diabetes - DM Type 2 High Blood Pressure  Review of Systems Negative Allergic/Immunologic Negative Cardiovascular Negative Constitutional Negative Ear, Nose, Mouth & Throat Negative Endocrine Negative Eyes Negative Gastrointestinal Negative Genitourinary Negative Hemotologic/Lymphatic Negative Integumentary Negative Musculoskeletal Negative Neurological Negative Psychiatry Negative Respiratory  Social   Smoker, current status unknown   Medication Prednisolone-Moxifloxacin-Bromfenac,  Meloxicam, Hydrolazine, Losartan, Atorvastatin, Clonidine, Lantus, Metformin,   Sx/Procedures Phaco c IOL OS,  Hysterectomy,   Drug Allergies  JANUVIA,   History & Physical: Heent: cataract, right eye NECK: supple without bruits LUNGS: lungs clear to auscultation CV: regular rate and rhythm Abdomen: soft and non-tender Impression & Plan: Assessment: 1.  CATARACT EXTRACTION STATUS; Left Eye (Z98.42) 2.  INTRAOCULAR LENS IOL (Z96.1) 3.  NUCLEAR SCLEROSIS AGE RELATED; Right Eye (H25.11) 4.  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  1 week after cataract surgery. Doing well with improved vision and normal  eye pressure. Call with any problems or concerns. Continue Pred-Moxi-Brom 2x/day for 3 more weeks.  2.  Doing well since surgery Continue Post-op medications  3.  Cataract accounts for the patient's decreased vision. This visual impairment is not correctable with a tolerable change in glasses or contact lenses. Cataract surgery with an implantation of a new lens should significantly improve the visual and functional status of the patient. Discussed all risks, benefits, alternatives, and potential complications. Discussed the procedures and recovery. Patient desires to have surgery. A-scan ordered and performed today for intra-ocular lens calculations. The surgery will be performed in order to improve vision for driving, reading, and for eye examinations. Recommend phacoemulsification with intra-ocular lens. Recommend Dextenza for post-operative pain and inflammation. Right Eye. Surgery required to correct imbalance of vision. Dilates well - shugarcaine by protocol.  4.  likely cause of post-op corneal edema. Monitor.

## 2022-09-05 ENCOUNTER — Encounter (HOSPITAL_COMMUNITY): Payer: Self-pay

## 2022-09-05 ENCOUNTER — Encounter (HOSPITAL_COMMUNITY)
Admission: RE | Admit: 2022-09-05 | Discharge: 2022-09-05 | Disposition: A | Discharge status: Home / Self Care | Source: Ambulatory Visit | Attending: Ophthalmology | Admitting: Ophthalmology

## 2022-09-06 ENCOUNTER — Encounter: Payer: Self-pay | Admitting: Gastroenterology

## 2022-09-06 ENCOUNTER — Ambulatory Visit (INDEPENDENT_AMBULATORY_CARE_PROVIDER_SITE_OTHER): Admitting: Gastroenterology

## 2022-09-06 ENCOUNTER — Telehealth: Payer: Self-pay | Admitting: "Endocrinology

## 2022-09-06 VITALS — BP 131/68 | HR 80 | Temp 98.2°F | Ht 65.0 in | Wt 224.4 lb

## 2022-09-06 DIAGNOSIS — Z86010 Personal history of colonic polyps: Secondary | ICD-10-CM | POA: Diagnosis not present

## 2022-09-06 DIAGNOSIS — K219 Gastro-esophageal reflux disease without esophagitis: Secondary | ICD-10-CM | POA: Diagnosis not present

## 2022-09-06 DIAGNOSIS — K5909 Other constipation: Secondary | ICD-10-CM | POA: Diagnosis not present

## 2022-09-06 DIAGNOSIS — E1165 Type 2 diabetes mellitus with hyperglycemia: Secondary | ICD-10-CM

## 2022-09-06 MED ORDER — INSULIN GLULISINE 100 UNIT/ML SOLOSTAR PEN
14.00 [IU] | PEN_INJECTOR | Freq: Three times a day (TID) | SUBCUTANEOUS | 0 refills | Status: DC
Start: 2022-09-06 — End: 2022-11-26

## 2022-09-06 NOTE — Telephone Encounter (Signed)
Pt states she needs a refill on her Apidra Insulin. [facility]

## 2022-09-06 NOTE — Telephone Encounter (Signed)
Rx refill for apidra sent to [facility].

## 2022-09-06 NOTE — Patient Instructions (Signed)
Continue pantoprazole 40 mg daily 30 minutes before breakfast.  Continue Linzess 72 mcg daily at least 30 minutes before breakfast.  I recommend that you reach out to Dr. Isidoro Donning office to ask them about mealtime insulin.  It looks like this was previously recommended at your office visit in November 2023.  I would like to get you scheduled for colonoscopy once your blood sugars are under better control.  If you resume insulin and find that your blood sugars are staying below 200, please let me know.  If we can schedule you for colonoscopy within 3-4 months with me seeing you today, I would not have to see you back in the office prior to your procedure.  Otherwise, we will plan to follow-up in 6 months or sooner if needed.  It was great to see you again today!  Ermalinda Memos, PA-C [facility]

## 2022-09-07 ENCOUNTER — Ambulatory Visit (HOSPITAL_COMMUNITY)
Admission: RE | Admit: 2022-09-07 | Discharge: 2022-09-07 | Disposition: A | Discharge status: Home / Self Care | Source: Ambulatory Visit | Attending: Ophthalmology | Admitting: Ophthalmology

## 2022-09-07 ENCOUNTER — Ambulatory Visit (HOSPITAL_COMMUNITY): Admitting: Anesthesiology

## 2022-09-07 ENCOUNTER — Encounter (HOSPITAL_COMMUNITY): Payer: Self-pay | Admitting: Ophthalmology

## 2022-09-07 ENCOUNTER — Encounter (HOSPITAL_COMMUNITY)
Admission: RE | Disposition: A | Discharge status: Home / Self Care | Payer: Self-pay | Source: Ambulatory Visit | Attending: Ophthalmology

## 2022-09-07 ENCOUNTER — Other Ambulatory Visit: Payer: Self-pay

## 2022-09-07 DIAGNOSIS — Z9842 Cataract extraction status, left eye: Secondary | ICD-10-CM | POA: Diagnosis not present

## 2022-09-07 DIAGNOSIS — H2511 Age-related nuclear cataract, right eye: Secondary | ICD-10-CM

## 2022-09-07 DIAGNOSIS — K219 Gastro-esophageal reflux disease without esophagitis: Secondary | ICD-10-CM | POA: Diagnosis not present

## 2022-09-07 DIAGNOSIS — E1136 Type 2 diabetes mellitus with diabetic cataract: Secondary | ICD-10-CM | POA: Diagnosis present

## 2022-09-07 DIAGNOSIS — I10 Essential (primary) hypertension: Secondary | ICD-10-CM

## 2022-09-07 DIAGNOSIS — Z7984 Long term (current) use of oral hypoglycemic drugs: Secondary | ICD-10-CM

## 2022-09-07 DIAGNOSIS — Z79899 Other long term (current) drug therapy: Secondary | ICD-10-CM | POA: Diagnosis not present

## 2022-09-07 DIAGNOSIS — Z961 Presence of intraocular lens: Secondary | ICD-10-CM | POA: Insufficient documentation

## 2022-09-07 DIAGNOSIS — E1165 Type 2 diabetes mellitus with hyperglycemia: Secondary | ICD-10-CM | POA: Insufficient documentation

## 2022-09-07 DIAGNOSIS — Z794 Long term (current) use of insulin: Secondary | ICD-10-CM

## 2022-09-07 DIAGNOSIS — H18513 Endothelial corneal dystrophy, bilateral: Secondary | ICD-10-CM | POA: Insufficient documentation

## 2022-09-07 DIAGNOSIS — G4730 Sleep apnea, unspecified: Secondary | ICD-10-CM | POA: Insufficient documentation

## 2022-09-07 DIAGNOSIS — F17200 Nicotine dependence, unspecified, uncomplicated: Secondary | ICD-10-CM | POA: Insufficient documentation

## 2022-09-07 DIAGNOSIS — F17210 Nicotine dependence, cigarettes, uncomplicated: Secondary | ICD-10-CM

## 2022-09-07 HISTORY — PX: CATARACT EXTRACTION W/PHACO: SHX586

## 2022-09-07 LAB — GLUCOSE, CAPILLARY: Glucose-Capillary: 195 mg/dL — ABNORMAL HIGH (ref 70–99)

## 2022-09-07 SURGERY — PHACOEMULSIFICATION, CATARACT, WITH IOL INSERTION
Anesthesia: Monitor Anesthesia Care | Site: Eye | Laterality: Right

## 2022-09-07 MED ORDER — MIDAZOLAM HCL 2 MG/2ML IJ SOLN
INTRAMUSCULAR | Status: AC
  Filled 2022-09-07: qty 2

## 2022-09-07 MED ORDER — POVIDONE-IODINE 5 % OP SOLN
OPHTHALMIC | Status: DC | PRN
  Administered 2022-09-07: 1 via OPHTHALMIC

## 2022-09-07 MED ORDER — TROPICAMIDE 1 % OP SOLN
1.0000 [drp] | OPHTHALMIC | Status: AC | PRN
  Administered 2022-09-07 (×3): 1 [drp] via OPHTHALMIC

## 2022-09-07 MED ORDER — STERILE WATER FOR IRRIGATION IR SOLN
Status: DC | PRN
  Administered 2022-09-07: 250 mL

## 2022-09-07 MED ORDER — SODIUM HYALURONATE 23MG/ML IO SOSY
PREFILLED_SYRINGE | INTRAOCULAR | Status: DC | PRN
  Administered 2022-09-07: .6 mL via INTRAOCULAR

## 2022-09-07 MED ORDER — PHENYLEPHRINE HCL 2.5 % OP SOLN
1.0000 [drp] | OPHTHALMIC | Status: AC | PRN
  Administered 2022-09-07 (×3): 1 [drp] via OPHTHALMIC

## 2022-09-07 MED ORDER — SODIUM HYALURONATE 10 MG/ML IO SOLUTION
PREFILLED_SYRINGE | INTRAOCULAR | Status: DC | PRN
  Administered 2022-09-07: .85 mL via INTRAOCULAR

## 2022-09-07 MED ORDER — SODIUM CHLORIDE 0.9% FLUSH
INTRAVENOUS | Status: DC | PRN
  Administered 2022-09-07: 3 mL via INTRAVENOUS

## 2022-09-07 MED ORDER — NEOMYCIN-POLYMYXIN-DEXAMETH 3.5-10000-0.1 OP SUSP
OPHTHALMIC | Status: DC | PRN
  Administered 2022-09-07: 2 [drp] via OPHTHALMIC

## 2022-09-07 MED ORDER — BSS IO SOLN
INTRAOCULAR | Status: DC | PRN
  Administered 2022-09-07: 15 mL via INTRAOCULAR

## 2022-09-07 MED ORDER — LIDOCAINE HCL 3.5 % OP GEL
1.0000 | Freq: Once | OPHTHALMIC | Status: AC
  Administered 2022-09-07: 1 via OPHTHALMIC

## 2022-09-07 MED ORDER — LIDOCAINE HCL (PF) 1 % IJ SOLN
INTRAOCULAR | Status: DC | PRN
  Administered 2022-09-07: 1 mL via OPHTHALMIC

## 2022-09-07 MED ORDER — EPINEPHRINE PF 1 MG/ML IJ SOLN: INTRAOCULAR | Status: DC | PRN

## 2022-09-07 MED ORDER — MIDAZOLAM HCL 2 MG/2ML IJ SOLN
INTRAMUSCULAR | Status: DC | PRN
  Administered 2022-09-07: 2 mg via INTRAVENOUS

## 2022-09-07 MED ORDER — TETRACAINE HCL 0.5 % OP SOLN
1.0000 [drp] | OPHTHALMIC | Status: AC | PRN
  Administered 2022-09-07 (×3): 1 [drp] via OPHTHALMIC

## 2022-09-07 SURGICAL SUPPLY — 13 items
CATARACT SUITE SIGHTPATH (MISCELLANEOUS) ×1 IMPLANT
CLOTH BEACON ORANGE TIMEOUT ST (SAFETY) ×2 IMPLANT
EYE SHIELD UNIVERSAL CLEAR (GAUZE/BANDAGES/DRESSINGS) IMPLANT
FEE CATARACT SUITE SIGHTPATH (MISCELLANEOUS) ×2 IMPLANT
GLOVE BIOGEL PI IND STRL 7.0 (GLOVE) ×4 IMPLANT
LENS IOL TECNIS EYHANCE 21.0 (Intraocular Lens) IMPLANT
NDL HYPO 18GX1.5 BLUNT FILL (NEEDLE) ×2 IMPLANT
NEEDLE HYPO 18GX1.5 BLUNT FILL (NEEDLE) ×1 IMPLANT
PAD ARMBOARD 7.5X6 YLW CONV (MISCELLANEOUS) ×2 IMPLANT
RING MALYGIN 7.0 (MISCELLANEOUS) IMPLANT
SYR TB 1ML LL NO SAFETY (SYRINGE) ×2 IMPLANT
TAPE SURG TRANSPORE 1 IN (GAUZE/BANDAGES/DRESSINGS) IMPLANT
WATER STERILE IRR 250ML POUR (IV SOLUTION) ×2 IMPLANT

## 2022-09-07 NOTE — Discharge Instructions (Signed)
Please discharge patient when stable, will follow up today with Dr. Emelie Newsom at the [facility] [place] office immediately following discharge.  Leave shield in place until visit.  All paperwork with discharge instructions will be given at the office.  [facility] [place] Address:  [address]  [place], [place] [address]  

## 2022-09-07 NOTE — Transfer of Care (Signed)
Immediate Anesthesia Transfer of Care Note  Patient: Casey Johnston  Procedure(s) Performed: CATARACT EXTRACTION PHACO AND INTRAOCULAR LENS PLACEMENT (IOC) (Right: Eye)  Patient Location: Short Stay  Anesthesia Type:MAC  Level of Consciousness: awake, alert , and oriented  Airway & Oxygen Therapy: Patient Spontanous Breathing  Post-op Assessment: Report given to RN, Post -op Vital signs reviewed and stable, Patient moving all extremities X 4, and Patient able to stick tongue midline  Post vital signs: Reviewed  Last Vitals:  Vitals Value Taken Time  BP 158/63 09/07/22 1009  Temp 36.8 C 09/07/22 1009  Pulse 70 09/07/22 1009  Resp 20 09/07/22 1009  SpO2 98 % 09/07/22 1009    Last Pain:  Vitals:   09/07/22 0834  TempSrc: Oral  PainSc: 0-No pain         Complications: No notable events documented.

## 2022-09-07 NOTE — Op Note (Signed)
Date of procedure: 09/07/22  Pre-operative diagnosis: Visually significant age-related nuclear cataract, Right Eye (H25.11)  Post-operative diagnosis: Visually significant age-related nuclear cataract, Right Eye  Procedure: Removal of cataract via phacoemulsification and insertion of intra-ocular lens Johnson and Johnson DIB00 +21.0D into the capsular bag of the Right Eye  Attending surgeon: Rudy Jew. Keyante Durio, MD, MA  Anesthesia: MAC, Topical Akten  Complications: None  Estimated Blood Loss: <66mL (minimal)  Specimens: None  Implants: As above  Indications:  Visually significant age-related cataract, Right Eye  Procedure:  The patient was seen and identified in the pre-operative area. The operative eye was identified and dilated.  The operative eye was marked.  Topical anesthesia was administered to the operative eye.     The patient was then to the operative suite and placed in the supine position.  A timeout was performed confirming the patient, procedure to be performed, and all other relevant information.   The patient's face was prepped and draped in the usual fashion for intra-ocular surgery.  A lid speculum was placed into the operative eye and the surgical microscope moved into place and focused.  A superotemporal paracentesis was created using a 20 gauge paracentesis blade.  Shugarcaine was injected into the anterior chamber.  Viscoelastic was injected into the anterior chamber.  A temporal clear-corneal main wound incision was created using a 2.69mm microkeratome.  A continuous curvilinear capsulorrhexis was initiated using an irrigating cystitome and completed using capsulorrhexis forceps.  Hydrodissection and hydrodeliniation were performed.  Viscoelastic was injected into the anterior chamber.  A phacoemulsification handpiece and a chopper as a second instrument were used to remove the nucleus and epinucleus. The irrigation/aspiration handpiece was used to remove any remaining  cortical material.   The capsular bag was reinflated with viscoelastic, checked, and found to be intact.  The intraocular lens was inserted into the capsular bag.  The irrigation/aspiration handpiece was used to remove any remaining viscoelastic.  The clear corneal wound and paracentesis wounds were then hydrated and checked with Weck-Cels to be watertight. Maxitrol drops were instilled into the operative eye.  The lid-speculum and drape was removed, and the patient's face was cleaned with a wet and dry 4x4.  A clear shield was taped over the eye. The patient was taken to the post-operative care unit in good condition, having tolerated the procedure well.  Post-Op Instructions: The patient will follow up at [facility] for a same day post-operative evaluation and will receive all other orders and instructions.

## 2022-09-07 NOTE — Interval H&P Note (Signed)
History and Physical Interval Note:  09/07/2022 9:39 AM  Casey Johnston  has presented today for surgery, with the diagnosis of nuclear sclerosis age related cataract; right.  The various methods of treatment have been discussed with the patient and family. After consideration of risks, benefits and other options for treatment, the patient has consented to  Procedure(s): CATARACT EXTRACTION PHACO AND INTRAOCULAR LENS PLACEMENT (IOC) (Right) as a surgical intervention.  The patient's history has been reviewed, patient examined, no change in status, stable for surgery.  I have reviewed the patient's chart and labs.  Questions were answered to the patient's satisfaction.     Fabio Pierce

## 2022-09-07 NOTE — Anesthesia Preprocedure Evaluation (Signed)
Anesthesia Evaluation  Patient identified by MRN, date of birth, ID band Patient awake    Reviewed: Allergy & Precautions, H&P , NPO status , Patient's Chart, lab work & pertinent test results  Airway Mallampati: II  TM Distance: >3 FB Neck ROM: Full    Dental  (+) Dental Advisory Given, Missing   Pulmonary sleep apnea , Current Smoker and Patient abstained from smoking.   Pulmonary exam normal breath sounds clear to auscultation       Cardiovascular Exercise Tolerance: Good hypertension, Pt. on medications Normal cardiovascular exam Rhythm:Regular Rate:Normal     Neuro/Psych  Headaches PSYCHIATRIC DISORDERS Anxiety Depression       GI/Hepatic Neg liver ROS,GERD  Medicated and Controlled,,  Endo/Other  diabetes, Poorly Controlled, Type 2, Oral Hypoglycemic Agents, Insulin Dependent    Renal/GU negative Renal ROS  negative genitourinary   Musculoskeletal negative musculoskeletal ROS (+)    Abdominal   Peds negative pediatric ROS (+)  Hematology negative hematology ROS (+)   Anesthesia Other Findings   Reproductive/Obstetrics negative OB ROS                             Anesthesia Physical Anesthesia Plan  ASA: 3  Anesthesia Plan: MAC   Post-op Pain Management: Minimal or no pain anticipated   Induction:   PONV Risk Score and Plan: Treatment may vary due to age or medical condition  Airway Management Planned: Nasal Cannula and Natural Airway  Additional Equipment:   Intra-op Plan:   Post-operative Plan:   Informed Consent: I have reviewed the patients History and Physical, chart, labs and discussed the procedure including the risks, benefits and alternatives for the proposed anesthesia with the patient or authorized representative who has indicated his/her understanding and acceptance.     Dental advisory given  Plan Discussed with: CRNA and Surgeon  Anesthesia Plan  Comments:         Anesthesia Quick Evaluation

## 2022-09-07 NOTE — Anesthesia Postprocedure Evaluation (Signed)
Anesthesia Post Note  Patient: Casey Johnston  Procedure(s) Performed: CATARACT EXTRACTION PHACO AND INTRAOCULAR LENS PLACEMENT (IOC) (Right: Eye)  Patient location during evaluation: Phase II Anesthesia Type: MAC Level of consciousness: awake and alert and oriented Pain management: pain level controlled Vital Signs Assessment: post-procedure vital signs reviewed and stable Respiratory status: spontaneous breathing, nonlabored ventilation and respiratory function stable Cardiovascular status: blood pressure returned to baseline and stable Postop Assessment: no apparent nausea or vomiting Anesthetic complications: no  No notable events documented.   Last Vitals:  Vitals:   09/07/22 0834 09/07/22 1009  BP: (!) 159/58 (!) 158/63  Pulse: 69 70  Resp: (!) 21 20  Temp: 36.8 C 36.8 C  SpO2: 94% 98%    Last Pain:  Vitals:   09/07/22 1010  TempSrc:   PainSc: 0-No pain                 Zanobia Griebel C Graylyn Bunney

## 2022-09-11 ENCOUNTER — Encounter (HOSPITAL_COMMUNITY): Payer: Self-pay | Admitting: Ophthalmology

## 2022-10-08 ENCOUNTER — Other Ambulatory Visit (HOSPITAL_COMMUNITY)
Admission: RE | Admit: 2022-10-08 | Discharge: 2022-10-08 | Disposition: A | Discharge status: Home / Self Care | Source: Ambulatory Visit | Attending: "Endocrinology | Admitting: "Endocrinology

## 2022-10-08 DIAGNOSIS — E1165 Type 2 diabetes mellitus with hyperglycemia: Secondary | ICD-10-CM | POA: Insufficient documentation

## 2022-10-08 LAB — COMPREHENSIVE METABOLIC PANEL
ALT: 15 U/L (ref 0–44)
AST: 22 U/L (ref 15–41)
Albumin: 3.6 g/dL (ref 3.5–5.0)
Alkaline Phosphatase: 81 U/L (ref 38–126)
Anion gap: 10 (ref 5–15)
BUN: 21 mg/dL (ref 8–23)
CO2: 24 mmol/L (ref 22–32)
Calcium: 9.3 mg/dL (ref 8.9–10.3)
Chloride: 105 mmol/L (ref 98–111)
Creatinine, Ser: 0.97 mg/dL (ref 0.44–1.00)
GFR, Estimated: >60 mL/min (ref >60)
Glucose, Bld: 100 mg/dL — ABNORMAL HIGH (ref 70–99)
Potassium: 3.7 mmol/L (ref 3.5–5.1)
Sodium: 139 mmol/L (ref 135–145)
Total Bilirubin: 0.6 mg/dL (ref 0.3–1.2)
Total Protein: 7.7 g/dL (ref 6.5–8.1)

## 2022-10-08 LAB — TSH: TSH: 3.273 u[IU]/mL (ref 0.350–4.500)

## 2022-10-08 LAB — VITAMIN D 25 HYDROXY (VIT D DEFICIENCY, FRACTURES): Vit D, 25-Hydroxy: 19.76 ng/mL — ABNORMAL LOW (ref 30–100)

## 2022-10-08 LAB — LIPID PANEL
Cholesterol: 130 mg/dL (ref 0–200)
HDL: 37 mg/dL — ABNORMAL LOW (ref >40)
LDL Cholesterol: 58 mg/dL (ref 0–99)
Total CHOL/HDL Ratio: 3.5 RATIO
Triglycerides: 177 mg/dL — ABNORMAL HIGH (ref <150)
VLDL: 35 mg/dL (ref 0–40)

## 2022-10-08 LAB — T4, FREE: Free T4: 0.62 ng/dL (ref 0.61–1.12)

## 2022-10-08 LAB — VITAMIN B12: Vitamin B-12: 373 pg/mL (ref 180–914)

## 2022-10-16 ENCOUNTER — Ambulatory Visit (INDEPENDENT_AMBULATORY_CARE_PROVIDER_SITE_OTHER): Admitting: "Endocrinology

## 2022-10-16 ENCOUNTER — Encounter: Payer: Self-pay | Admitting: "Endocrinology

## 2022-10-16 VITALS — BP 120/58 | HR 76 | Ht 65.5 in | Wt 219.0 lb

## 2022-10-16 DIAGNOSIS — E1165 Type 2 diabetes mellitus with hyperglycemia: Secondary | ICD-10-CM | POA: Diagnosis not present

## 2022-10-16 DIAGNOSIS — F17200 Nicotine dependence, unspecified, uncomplicated: Secondary | ICD-10-CM

## 2022-10-16 DIAGNOSIS — Z794 Long term (current) use of insulin: Secondary | ICD-10-CM

## 2022-10-16 DIAGNOSIS — I10 Essential (primary) hypertension: Secondary | ICD-10-CM | POA: Diagnosis not present

## 2022-10-16 DIAGNOSIS — E782 Mixed hyperlipidemia: Secondary | ICD-10-CM

## 2022-10-16 LAB — POCT GLYCOSYLATED HEMOGLOBIN (HGB A1C): HbA1c, POC (controlled diabetic range): 8.6 % — AB (ref 0.0–7.0)

## 2022-10-16 MED ORDER — EMPAGLIFLOZIN 10 MG PO TABS: 10.0000 mg | ORAL_TABLET | Freq: Every day | ORAL | 1 refills | Status: DC

## 2022-10-16 NOTE — Patient Instructions (Signed)

## 2022-10-16 NOTE — Progress Notes (Signed)
10/16/2022, 6:45 PM  Endocrinology follow-up note   Subjective:    Patient ID: Casey Johnston, female    DOB: 03-14-1961.  [place] Flores is being seen in follow-up after she was seen in consultation for management of currently uncontrolled type 2 diabetes, hyperlipidemia, hypertension. PCP: Waldon Reining, MD.   Past Medical History:  Diagnosis Date   Anxiety and depression    Chronic constipation    Diabetes mellitus, type II (HCC)    Dr. Fransico Him managing   Frequent headaches chronic   ibuprofen helps some   GERD (gastroesophageal reflux disease)    History of UTI    Recurrent per pt (approx 3 per year)   Hyperlipidemia    Hypertension    Leukocytosis 12/2015   mild lymphocytosis.  Path smear review reassuring.  Repeat 01/2016 stable.   Morbid obesity (HCC)    OSA on CPAP    not using CPAP anymore    Past Surgical History:  Procedure Laterality Date   ABDOMINAL HYSTERECTOMY  1998   Dysfunctional uterine bleeding.  No hx of abnormal paps.   BIOPSY  10/20/2018   Procedure: BIOPSY;  Surgeon: West Bali, MD;  Location: [facility];  Service: Endoscopy;;  stomach   CATARACT EXTRACTION W/PHACO Left 08/24/2022   Procedure: CATARACT EXTRACTION PHACO AND INTRAOCULAR LENS PLACEMENT (IOC);  Surgeon: Fabio Pierce, MD;  Location: [facility];  Service: Ophthalmology;  Laterality: Left;  CDE 8.53   CATARACT EXTRACTION W/PHACO Right 09/07/2022   Procedure: CATARACT EXTRACTION PHACO AND INTRAOCULAR LENS PLACEMENT (IOC);  Surgeon: Fabio Pierce, MD;  Location: [facility];  Service: Ophthalmology;  Laterality: Right;  CDE: 10.02   COLONOSCOPY  2012   Dr. Samuella Cota: 1.5 cm pendunculated descending colon polyp (tubulovillous adenoma) and diminutive hyperplastic polyps   COLONOSCOPY N/A 10/20/2018   Procedure: COLONOSCOPY;  Surgeon: West Bali, MD; 4 tubular adenomas, diverticulosis in the sigmoid colon,  tortuous left colon, external and internal hemorrhoids. Due for repeat in 2023.    ESOPHAGOGASTRODUODENOSCOPY N/A 10/20/2018   Procedure: ESOPHAGOGASTRODUODENOSCOPY (EGD);  Surgeon: West Bali, MD;  gastritis and duodenitis. No H. Pylori.    POLYPECTOMY  10/20/2018   Procedure: POLYPECTOMY;  Surgeon: West Bali, MD;  Location: [facility];  Service: Endoscopy;;   wart removal  08/04/2019   benign    Social History   Socioeconomic History   Marital status: Divorced    Spouse name: Not on file   Number of children: Not on file   Years of education: Not on file   Highest education level: Not on file  Occupational History   Not on file  Tobacco Use   Smoking status: Every Day    Packs/day: 0.25    Years: 20.00    Additional pack years: 0.00    Total pack years: 5.00    Types: Cigarettes   Smokeless tobacco: Never  Vaping Use   Vaping Use: Never used  Substance and Sexual Activity   Alcohol use: No   Drug use: No   Sexual activity: Not on file  Other Topics Concern   Not on file  Social  History Narrative   Separated x 20 yrs, one daughter.  She lives with her.  Great niece and nephew live with her as well.   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   Tob: 7 pack year hx, quit 2010.   Alc: none   Exercise: none   Social Determinants of Corporate investment banker Strain: Not on file  Food Insecurity: Not on file  Transportation Needs: Not on file  Physical Activity: Not on file  Stress: Not on file  Social Connections: Not on file    Family History  Problem Relation Age of Onset   Cancer Mother        ovarian   COPD Father    Diabetes Father    Hyperlipidemia Father    Heart attack Father    Heart failure Father    Colon cancer Neg Hx     Outpatient Encounter Medications as of 10/16/2022  Medication Sig   empagliflozin (JARDIANCE) 10 MG TABS tablet Take 1 tablet (10 mg total) by mouth daily before breakfast.   albuterol (VENTOLIN HFA) 108 (90 Base)  MCG/ACT inhaler INHALE 2 PUFFS BY MOUTH EVERY 6 HOURS AS NEEDED FOR COUGHING, WHEEZING, OR SHORTNESS OF BREATH (Patient not taking: Reported on 03/08/2022)   atorvastatin (LIPITOR) 20 MG tablet TAKE 1 Tablet BY MOUTH ONCE EVERY DAY   cetirizine (ZYRTEC) 10 MG tablet Take 10 mg by mouth daily.   cloNIDine (CATAPRES) 0.1 MG tablet Take 1 tablet (0.1 mg total) by mouth 2 (two) times daily.   gabapentin (NEURONTIN) 100 MG capsule Take 1 capsule (100 mg total) by mouth every 8 (eight) hours as needed.   hydrALAZINE (APRESOLINE) 10 MG tablet Take 1 tablet (10 mg total) by mouth 3 (three) times daily.   hydrochlorothiazide (HYDRODIURIL) 25 MG tablet Take 0.5 tablets (12.5 mg total) by mouth daily.   insulin glargine (LANTUS) 100 UNIT/ML injection Inject 0.8 mLs (80 Units total) into the skin at bedtime.   insulin glulisine (APIDRA) 100 UNIT/ML Solostar Pen Inject 14-20 Units into the skin 3 (three) times daily before meals.   linaclotide (LINZESS) 72 MCG capsule Take 72 mcg by mouth daily before breakfast.   losartan (COZAAR) 100 MG tablet TAKE 1 Tablet BY MOUTH ONCE EVERY DAY   metFORMIN (GLUCOPHAGE) 1000 MG tablet Take 1 tablet (1,000 mg total) by mouth 2 (two) times daily with a meal.   metoprolol tartrate (LOPRESSOR) 100 MG tablet Take 1 tablet (100 mg total) by mouth 2 (two) times daily.   pantoprazole (PROTONIX) 40 MG tablet Take 1 tablet (40 mg total) by mouth daily before breakfast.   [DISCONTINUED] glipiZIDE (GLUCOTROL) 5 MG tablet Take 1 tablet (5 mg total) by mouth daily before breakfast.   No facility-administered encounter medications on file as of 10/16/2022.    ALLERGIES: Allergies  Allergen Reactions   Amlodipine Swelling   Januvia [Sitagliptin] Other (See Comments)    Caused pancreatitis    VACCINATION STATUS: Immunization History  Administered Date(s) Administered   Tdap 02/17/2016    Diabetes She presents for her follow-up diabetic visit. She has type 2 diabetes mellitus.  Onset time: She was diagnosed at approximate age of 62 years.  She was previously seen in this clinic prior to 2018. Her disease course has been improving. There are no hypoglycemic associated symptoms. Pertinent negatives for hypoglycemia include no confusion, headaches, pallor or seizures. Associated symptoms include fatigue, polydipsia and polyuria. Pertinent negatives for diabetes include no chest pain and no polyphagia. There are no  hypoglycemic complications. Symptoms are improving. (Noncompliance.) Risk factors for coronary artery disease include diabetes mellitus, dyslipidemia, hypertension, tobacco exposure, post-menopausal, sedentary lifestyle, family history and obesity. Current diabetic treatment includes insulin injections. Her weight is fluctuating minimally. She is following a generally unhealthy diet. When asked about meal planning, she reported none. She has not had a previous visit with a dietitian. She never participates in exercise. Her home blood glucose trend is decreasing steadily. Her breakfast blood glucose range is generally 140-180 mg/dl. Her lunch blood glucose range is generally 180-200 mg/dl. Her dinner blood glucose range is generally 180-200 mg/dl. Her bedtime blood glucose range is generally 180-200 mg/dl. Her overall blood glucose range is 180-200 mg/dl. (She presents with improving glycemic profile averaging 182 for the last 30 days.  Her point-of-care A1c is 8.6% improving from 10.9%.   She did not document any hypoglycemia.  ) An ACE inhibitor/angiotensin II receptor blocker is being taken. Eye exam is current.  Hyperlipidemia This is a chronic problem. The current episode started more than 1 year ago. The problem is uncontrolled. Exacerbating diseases include diabetes and obesity. Pertinent negatives include no chest pain, myalgias or shortness of breath. Current antihyperlipidemic treatment includes statins. Risk factors for coronary artery disease include diabetes  mellitus, dyslipidemia, hypertension, a sedentary lifestyle, post-menopausal, obesity and family history.  Hypertension This is a chronic problem. The current episode started more than 1 year ago. The problem is controlled. Pertinent negatives include no chest pain, headaches, palpitations or shortness of breath. Risk factors for coronary artery disease include diabetes mellitus, dyslipidemia, family history, obesity, post-menopausal state, sedentary lifestyle and smoking/tobacco exposure. Past treatments include angiotensin blockers.       Objective:       10/16/2022    3:33 PM 09/07/2022   10:09 AM 09/07/2022    8:47 AM  Vitals with BMI  Height 5' 5.5"  5\' 5"   Weight 219 lbs  224 lbs 6 oz  BMI 35.88  37.34  Systolic 120 158   Diastolic 58 63   Pulse 76 70     BP (!) 120/58   Pulse 76   Ht 5' 5.5" (1.664 m)   Wt 219 lb (99.3 kg)   BMI 35.89 kg/m   Wt Readings from Last 3 Encounters:  10/16/22 219 lb (99.3 kg)  09/07/22 224 lb 6.4 oz (101.8 kg)  09/06/22 224 lb 6.4 oz (101.8 kg)      CMP ( most recent) CMP     Component Value Date/Time   NA 139 10/08/2022 1320   K 3.7 10/08/2022 1320   CL 105 10/08/2022 1320   CO2 24 10/08/2022 1320   GLUCOSE 100 (H) 10/08/2022 1320   BUN 21 10/08/2022 1320   CREATININE 0.97 10/08/2022 1320   CREATININE 0.66 08/27/2016 1521   CALCIUM 9.3 10/08/2022 1320   PROT 7.7 10/08/2022 1320   ALBUMIN 3.6 10/08/2022 1320   AST 22 10/08/2022 1320   ALT 15 10/08/2022 1320   ALKPHOS 81 10/08/2022 1320   BILITOT 0.6 10/08/2022 1320   GFRNONAA >60 10/08/2022 1320   GFRAA >60 02/09/2020 1054     Diabetic Labs (most recent): Lab Results  Component Value Date   HGBA1C 8.6 (A) 10/16/2022   HGBA1C 10.9 (A) 07/11/2022   HGBA1C 10.4 (A) 03/27/2022   MICROALBUR 150 03/27/2022   MICROALBUR 59.3 (H) 10/02/2021   MICROALBUR 154.7 (H) 04/29/2020     Lipid Panel ( most recent) Lipid Panel     Component Value Date/Time  CHOL 130 10/08/2022  1321   TRIG 177 (H) 10/08/2022 1321   HDL 37 (L) 10/08/2022 1321   CHOLHDL 3.5 10/08/2022 1321   VLDL 35 10/08/2022 1321   LDLCALC 58 10/08/2022 1321   LDLDIRECT 119.0 01/18/2016 1038      Lab Results  Component Value Date   TSH 3.273 10/08/2022   TSH 3.547 10/11/2020   TSH 1.23 01/18/2016   FREET4 0.62 10/08/2022   FREET4 0.69 10/11/2020      Assessment & Plan:   1. Uncontrolled type 2 diabetes mellitus with hyperglycemia [facility])   - [place] F Raso has currently uncontrolled symptomatic type 2 DM since  62 years of age.  She presents with improving glycemic profile averaging 182 for the last 30 days.  Her point-of-care A1c is 8.6% improving from 10.9%.   She did not document any hypoglycemia.  - I had a long discussion with her about the progressive nature of diabetes and the pathology behind its complications. -her diabetes is complicated by noncompliance/nonadherence, chronic smoking, obesity/sedentary life and she remains at a high risk for more acute and chronic complications which include CAD, CVA, CKD, retinopathy, and neuropathy. These are all discussed in detail with her.  - I have counseled her on diet  and weight management  by adopting a carbohydrate restricted/protein rich diet. Patient is encouraged to switch to  unprocessed or minimally processed     complex starch and increased protein intake (mostly plant source), fruits, and vegetables. -  she is advised to stick to a routine mealtimes to eat 3 meals  a day and avoid unnecessary snacks ( to snack only to correct hypoglycemia).   She did not engage in lifestyle medicine, she promises to try again.  - she acknowledges that there is a room for improvement in her food and drink choices. - Suggestion is made for her to avoid simple carbohydrates  from her diet including Cakes, Sweet Desserts, Ice Cream, Soda (diet and regular), Sweet Tea, Candies, Chips, Cookies, Store Bought Juices, Alcohol , Artificial  Sweeteners,  Coffee Creamer, and "Sugar-free" Products, Lemonade. This will help patient to have more stable blood glucose profile and potentially avoid unintended weight gain.  The following Lifestyle Medicine recommendations according to American College of Lifestyle Medicine  [facility]) were discussed and and offered to patient and she  agrees to start the journey:  A. Whole Foods, Plant-Based Nutrition comprising of fruits and vegetables, plant-based proteins, whole-grain carbohydrates was discussed in detail with the patient.   A list for source of those nutrients were also provided to the patient.  Patient will use only water or unsweetened tea for hydration. B.  The need to stay away from risky substances including alcohol, smoking; obtaining 7 to 9 hours of restorative sleep, at least 150 minutes of moderate intensity exercise weekly, the importance of healthy social connections,  and stress management techniques were discussed. C.  A full color page of  Calorie density of various food groups per pound showing examples of each food groups was provided to the patient.   - I have approached her with the following individualized plan to manage  her diabetes and patient agrees:    - She is advised to continue Lantus 80 units nightly, advised to continue Apidra 14-20 units 3 times a day with meals  for pre-meal BG readings of 90-150mg /dl, plus patient specific correction dose for unexpected hyperglycemia above 150mg /dl, associated with strict monitoring of glucose 4 times a day-before meals and at  bedtime. - she is warned not to take insulin without proper monitoring per orders. - Adjustment parameters are given to her for hypo and hyperglycemia in writing. - she is encouraged to call clinic for blood glucose levels less than 70 or above 200 mg /dl. - she is advised to continue metformin 1000 mg p.o. twice daily. -She would benefit from SGLT2 inhibitors better than glipizide.  I discussed and  discontinue glipizide.  I discussed and prescribed Jardiance 10 mg p.o. daily at breakfast.  Side effects and precautions discussed with her.   - she is high risk for pancreatitis due to chronic heavy smoking, will avoid incretin therapy for now.   - Specific targets for  A1c;  LDL, HDL,  and Triglycerides were discussed with the patient.  2) Blood Pressure /Hypertension:   Her blood pressure is controlled to target.   she is advised to continue her current medications including amlodipine 10 mg p.o. daily, clonidine 0.1 mg p.o. twice daily.  Her ongoing smoking is working again is her efforts to control hypertension.    The patient was counseled on the dangers of tobacco use, and was advised to quit.  Reviewed strategies to maximize success, including removing cigarettes and smoking materials from environment.   3) Lipids/Hyperlipidemia:   Review of her recent lipid panel showed uncontrolled  LDL at  151, hypertriglyceridemia at 179. -  she  is advised to continue Atorvastatin 20 mg p.o. daily at bedtime.  Side effects and precautions discussed with her.    4)  Weight/Diet:  Body mass index is 35.89 kg/m.  -   clearly complicating her diabetes care.   she is  a candidate for weight loss. I discussed with her the fact that loss of 5 - 10% of her  current body weight will have the most impact on her diabetes management.  Exercise, and detailed carbohydrates information provided  -  detailed on discharge instructions.  5) Chronic Care/Health Maintenance:  -she  is on ACEI/ARB and Statin medications and  is encouraged to initiate and continue to follow up with Ophthalmology, Dentist,  Podiatrist at least yearly or according to recommendations, and advised to  quit smoking. I have recommended yearly flu vaccine and pneumonia vaccine at least every 5 years; moderate intensity exercise for up to 150 minutes weekly; and  sleep for at least 7 hours a day.   Her recent point-of-care screening ABI  was negative for PAD.  This study will be repeated in March 2027, or sooner if needed.  - she is  advised to maintain close follow up with Waldon Reining, MD for primary care needs, as well as her other providers for optimal and coordinated care.   I spent  42  minutes in the care of the patient today including review of labs from CMP, Lipids, Thyroid Function, Hematology (current and previous including abstractions from other facilities); face-to-face time discussing  her blood glucose readings/logs, discussing hypoglycemia and hyperglycemia episodes and symptoms, medications doses, her options of short and long term treatment based on the latest standards of care / guidelines;  discussion about incorporating lifestyle medicine;  and documenting the encounter. Risk reduction counseling performed per USPSTF guidelines to reduce  obesity and cardiovascular risk factors.     Please refer to Patient Instructions for Blood Glucose Monitoring and Insulin/Medications Dosing Guide"  in media tab for additional information. Please  also refer to " Patient Self Inventory" in the Media  tab for reviewed elements of pertinent patient  history.  [place] F Maddux participated in the discussions, expressed understanding, and voiced agreement with the above plans.  All questions were answered to her satisfaction. she is encouraged to contact clinic should she have any questions or concerns prior to her return visit.     Follow up plan: - Return in about 4 months (around 02/16/2023) for Bring Meter/CGM Device/Logs- A1c in Office.  Marquis Lunch, MD [facility] [address] [place], [place] [address] Phone: [phone]  Fax: [phone]    10/16/2022, 6:45 PM  This note was partially dictated with voice recognition software. Similar sounding words can be transcribed inadequately or may not  be corrected upon review.

## 2022-10-17 ENCOUNTER — Encounter: Payer: Self-pay | Admitting: *Deleted

## 2022-10-17 ENCOUNTER — Telehealth: Payer: Self-pay | Admitting: *Deleted

## 2022-10-17 NOTE — Telephone Encounter (Signed)
Noted. Sent pt a MyChart message.  

## 2022-10-17 NOTE — Telephone Encounter (Signed)
Pt called and states she is ready to schedule a colonoscopy. She states she is not having any problems. She would like to have it done in late July.

## 2022-10-17 NOTE — Telephone Encounter (Signed)
Looks like her diabetes control has improved. Can you verify her current medication list. I believe there have been some changes since I saw her last time. Once this is up to date, we can proceed with scheduling.

## 2022-10-18 ENCOUNTER — Telehealth: Payer: Self-pay

## 2022-10-18 ENCOUNTER — Other Ambulatory Visit: Payer: Self-pay | Admitting: "Endocrinology

## 2022-10-18 ENCOUNTER — Other Ambulatory Visit: Payer: Self-pay | Admitting: *Deleted

## 2022-10-18 MED ORDER — GLIPIZIDE ER 5 MG PO TB24: 5.0000 mg | ORAL_TABLET | Freq: Every day | ORAL | 1 refills | Status: AC

## 2022-10-18 NOTE — Telephone Encounter (Signed)
Pt made aware

## 2022-10-18 NOTE — Telephone Encounter (Signed)
Pt called stating the jardiance Rx copay is 432.00. Pt requested a Rx for a replacement medication that is less costly. Uses [facility].

## 2022-10-18 NOTE — Telephone Encounter (Signed)
Left a message requesting pt return call to the office. 

## 2022-10-18 NOTE — Progress Notes (Signed)
Error

## 2022-10-18 NOTE — Telephone Encounter (Signed)
Reviewed medication list with patient. We will proceed with colonoscopy. See separate telephone note.

## 2022-10-18 NOTE — Telephone Encounter (Signed)
Patient sent updated medication list through MyChart. I double checked this with her.  What we have on file is correct.  She reports her blood sugars have improved significantly and are always below 200.  Usually around 120 or so.  Mindy/Tammy: Please proceed with scheduling colonoscopy with Dr. Marletta Lor. ASA 3 DX: History of adenomatous colon polyps  1 day prior to procedure: Take metformin 500 mg in the morning and evening, take glipizide as prescribed, take one half dose of Lantus (40 units at bedtime), take mealtime insulin as needed. Day of procedure: Do not take any morning diabetes medications.

## 2022-10-19 NOTE — Telephone Encounter (Signed)
LMOVM to call back to schedule 

## 2022-10-19 NOTE — Telephone Encounter (Signed)
Pt called back and was offered dates in June. She states would prefer to have July. Advised pt will call her once we get providers schedule for that month

## 2022-10-23 ENCOUNTER — Telehealth: Payer: Self-pay | Admitting: "Endocrinology

## 2022-10-23 NOTE — Telephone Encounter (Signed)
Faxed last office notes and A1C to [phone] as requested by Medical release form.

## 2022-10-29 ENCOUNTER — Encounter: Payer: Self-pay | Admitting: *Deleted

## 2022-10-29 ENCOUNTER — Other Ambulatory Visit: Payer: Self-pay | Admitting: *Deleted

## 2022-10-29 MED ORDER — PEG 3350-KCL-NA BICARB-NACL 420 G PO SOLR: 4000.0000 mL | Freq: Once | ORAL | 0 refills | Status: AC

## 2022-10-29 NOTE — Telephone Encounter (Signed)
Pt has been scheduled for 11/26/22. Instructions mailed and prep sent to the pharmacy.

## 2022-11-08 ENCOUNTER — Encounter: Payer: Self-pay | Admitting: *Deleted

## 2022-11-08 ENCOUNTER — Telehealth: Payer: Self-pay | Admitting: *Deleted

## 2022-11-08 NOTE — Telephone Encounter (Signed)
Pt returned call and procedure is on for 12/03/22. New instructions mailed.

## 2022-11-08 NOTE — Telephone Encounter (Signed)
LMTRC in regards to procedure on 11/26/22.

## 2022-11-12 NOTE — Telephone Encounter (Signed)
noted 

## 2022-11-20 ENCOUNTER — Encounter (HOSPITAL_COMMUNITY)

## 2022-11-28 NOTE — Patient Instructions (Signed)
Casey Johnston  11/28/2022     @PREFPERIOPPHARMACY @   Your procedure is scheduled on  12/03/2022.   Report to [facility] at   [phone] A.M.   Call this number if you have problems the morning of surgery:  [phone]  If you experience any cold or flu symptoms such as cough, fever, chills, shortness of breath, etc. between now and your scheduled surgery, please notify us at the above number.   Remember:  Follow the diet and prep instructions given to you by the office.     Trulicity last dose should have been on 11/25/2022.      DO NOT take any medications for diabetes the morning of your procedure.     Take these medicines the morning of surgery with A SIP OF WATER                     amlodipine,clonidine, metoprolol.     Do not wear jewelry, make-up or nail polish, including gel polish,  artificial nails, or any other type of covering on natural nails (fingers and  toes).  Do not wear lotions, powders, or perfumes, or deodorant.  Do not shave 48 hours prior to surgery.  Men may shave face and neck.  Do not bring valuables to the hospital.  [facility] is not responsible for any belongings or valuables.  Contacts, dentures or bridgework may not be worn into surgery.  Leave your suitcase in the car.  After surgery it may be brought to your room.  For patients admitted to the hospital, discharge time will be determined by your treatment team.  Patients discharged the day of surgery will not be allowed to drive home and must have someone with them for 24 hours.    Special instructions:   DO NOT smoke tobacco or vape for 24 hours before your procedure.  Please read over the following fact sheets that you were given. Anesthesia Post-op Instructions and Care and Recovery After Surgery      Colonoscopy, Adult, Care After The following information offers guidance on how to care for yourself after your procedure. Your health care provider may also give you more  specific instructions. If you have problems or questions, contact your health care provider. What can I expect after the procedure? After the procedure, it is common to have: A small amount of blood in your stool for 24 hours after the procedure. Some gas. Mild cramping or bloating of your abdomen. Follow these instructions at home: Eating and drinking  Drink enough fluid to keep your urine pale yellow. Follow instructions from your health care provider about eating or drinking restrictions. Resume your normal diet as told by your health care provider. Avoid heavy or fried foods that are hard to digest. Activity Rest as told by your health care provider. Avoid sitting for a long time without moving. Get up to take short walks every 1-2 hours. This is important to improve blood flow and breathing. Ask for help if you feel weak or unsteady. Return to your normal activities as told by your health care provider. Ask your health care provider what activities are safe for you. Managing cramping and bloating  Try walking around when you have cramps or feel bloated. If directed, apply heat to your abdomen as told by your health care provider. Use the heat source that your health care provider recommends, such as a moist heat pack or a heating pad. Place  a towel between your skin and the heat source. Leave the heat on for 20-30 minutes. Remove the heat if your skin turns bright red. This is especially important if you are unable to feel pain, heat, or cold. You have a greater risk of getting burned. General instructions If you were given a sedative during the procedure, it can affect you for several hours. Do not drive or operate machinery until your health care provider says that it is safe. For the first 24 hours after the procedure: Do not sign important documents. Do not drink alcohol. Do your regular daily activities at a slower pace than normal. Eat soft foods that are easy to digest. Take  over-the-counter and prescription medicines only as told by your health care provider. Keep all follow-up visits. This is important. Contact a health care provider if: You have blood in your stool 2-3 days after the procedure. Get help right away if: You have more than a small spotting of blood in your stool. You have large blood clots in your stool. You have swelling of your abdomen. You have nausea or vomiting. You have a fever. You have increasing pain in your abdomen that is not relieved with medicine. These symptoms may be an emergency. Get help right away. Call 911. Do not wait to see if the symptoms will go away. Do not drive yourself to the hospital. Summary After the procedure, it is common to have a small amount of blood in your stool. You may also have mild cramping and bloating of your abdomen. If you were given a sedative during the procedure, it can affect you for several hours. Do not drive or operate machinery until your health care provider says that it is safe. Get help right away if you have a lot of blood in your stool, nausea or vomiting, a fever, or increased pain in your abdomen. This information is not intended to replace advice given to you by your health care provider. Make sure you discuss any questions you have with your health care provider. Document Revised: 06/19/2022 Document Reviewed: 12/28/2020 Elsevier Patient Education  2024 [employer] Monitored Anesthesia Care, Care After The following information offers guidance on how to care for yourself after your procedure. Your health care provider may also give you more specific instructions. If you have problems or questions, contact your health care provider. What can I expect after the procedure? After the procedure, it is common to have: Tiredness. Little or no memory about what happened during or after the procedure. Impaired judgment when it comes to making decisions. Nausea or vomiting. Some trouble  with balance. Follow these instructions at home: For the time period you were told by your health care provider:  Rest. Do not participate in activities where you could fall or become injured. Do not drive or use machinery. Do not drink alcohol. Do not take sleeping pills or medicines that cause drowsiness. Do not make important decisions or sign legal documents. Do not take care of children on your own. Medicines Take over-the-counter and prescription medicines only as told by your health care provider. If you were prescribed antibiotics, take them as told by your health care provider. Do not stop using the antibiotic even if you start to feel better. Eating and drinking Follow instructions from your health care provider about what you may eat and drink. Drink enough fluid to keep your urine pale yellow. If you vomit: Drink clear fluids slowly and in small amounts as you are  able. Clear fluids include water, ice chips, low-calorie sports drinks, and fruit juice that has water added to it (diluted fruit juice). Eat light and bland foods in small amounts as you are able. These foods include bananas, applesauce, rice, lean meats, toast, and crackers. General instructions  Have a responsible adult stay with you for the time you are told. It is important to have someone help care for you until you are awake and alert. If you have sleep apnea, surgery and some medicines can increase your risk for breathing problems. Follow instructions from your health care provider about wearing your sleep device: When you are sleeping. This includes during daytime naps. While taking prescription pain medicines, sleeping medicines, or medicines that make you drowsy. Do not use any products that contain nicotine or tobacco. These products include cigarettes, chewing tobacco, and vaping devices, such as e-cigarettes. If you need help quitting, ask your health care provider. Contact a health care provider  if: You feel nauseous or vomit every time you eat or drink. You feel light-headed. You are still sleepy or having trouble with balance after 24 hours. You get a rash. You have a fever. You have redness or swelling around the IV site. Get help right away if: You have trouble breathing. You have new confusion after you get home. These symptoms may be an emergency. Get help right away. Call 911. Do not wait to see if the symptoms will go away. Do not drive yourself to the hospital. This information is not intended to replace advice given to you by your health care provider. Make sure you discuss any questions you have with your health care provider. Document Revised: 10/02/2021 Document Reviewed: 10/02/2021 Elsevier Patient Education  2024 [employer].

## 2022-11-29 ENCOUNTER — Telehealth: Payer: Self-pay | Admitting: *Deleted

## 2022-11-29 ENCOUNTER — Encounter (HOSPITAL_COMMUNITY)
Admission: RE | Admit: 2022-11-29 | Discharge: 2022-11-29 | Disposition: A | Discharge status: Home / Self Care | Source: Ambulatory Visit | Attending: Internal Medicine | Admitting: Internal Medicine

## 2022-11-29 VITALS — BP 141/63 | HR 73 | Temp 98.4°F | Resp 18 | Ht 65.5 in | Wt 218.9 lb

## 2022-11-29 DIAGNOSIS — I10 Essential (primary) hypertension: Secondary | ICD-10-CM | POA: Insufficient documentation

## 2022-11-29 DIAGNOSIS — Z01818 Encounter for other preprocedural examination: Secondary | ICD-10-CM | POA: Insufficient documentation

## 2022-11-29 DIAGNOSIS — E1165 Type 2 diabetes mellitus with hyperglycemia: Secondary | ICD-10-CM | POA: Insufficient documentation

## 2022-11-29 DIAGNOSIS — Z794 Long term (current) use of insulin: Secondary | ICD-10-CM | POA: Diagnosis not present

## 2022-11-29 LAB — BASIC METABOLIC PANEL
Anion gap: 10 (ref 5–15)
BUN: 11 mg/dL (ref 8–23)
CO2: 26 mmol/L (ref 22–32)
Calcium: 9 mg/dL (ref 8.9–10.3)
Chloride: 101 mmol/L (ref 98–111)
Creatinine, Ser: 0.81 mg/dL (ref 0.44–1.00)
GFR, Estimated: >60 mL/min (ref >60)
Glucose, Bld: 210 mg/dL — ABNORMAL HIGH (ref 70–99)
Potassium: 3.3 mmol/L — ABNORMAL LOW (ref 3.5–5.1)
Sodium: 137 mmol/L (ref 135–145)

## 2022-11-29 NOTE — Telephone Encounter (Signed)
-----   Message from Nurse Katheren Shams sent at 11/29/2022 12:01 PM EDT ----- Regarding: Cancel/Reschedule Hey! Patient forgot to stop her Trulicity.  She will be need to be cancelled and rescheduled so I guess just put her in the depot for now. Thank  you!

## 2022-11-29 NOTE — Telephone Encounter (Signed)
Called pt and she is aware will call once we get his future schedule.

## 2022-12-03 ENCOUNTER — Encounter (HOSPITAL_COMMUNITY): Admission: RE | Payer: Self-pay | Source: Ambulatory Visit

## 2022-12-03 ENCOUNTER — Ambulatory Visit (HOSPITAL_COMMUNITY): Admission: RE | Admit: 2022-12-03 | Source: Ambulatory Visit

## 2022-12-03 SURGERY — COLONOSCOPY WITH PROPOFOL
Anesthesia: Monitor Anesthesia Care

## 2022-12-04 ENCOUNTER — Other Ambulatory Visit (HOSPITAL_COMMUNITY): Payer: Self-pay | Admitting: Family Medicine

## 2022-12-04 DIAGNOSIS — Z1231 Encounter for screening mammogram for malignant neoplasm of breast: Secondary | ICD-10-CM

## 2022-12-05 ENCOUNTER — Telehealth: Payer: Self-pay | Admitting: Nurse Practitioner

## 2022-12-05 NOTE — Telephone Encounter (Signed)
Left a VM that her pt assistance is here for p/u.

## 2022-12-10 ENCOUNTER — Encounter: Payer: Self-pay | Admitting: *Deleted

## 2022-12-10 NOTE — Telephone Encounter (Signed)
Pt has been rescheduled for 12/24/22. Updated instructions sent to pt via MyChart.

## 2022-12-11 NOTE — Telephone Encounter (Signed)
Patient picked up her pt assistance

## 2022-12-19 ENCOUNTER — Ambulatory Visit (HOSPITAL_COMMUNITY)

## 2022-12-19 ENCOUNTER — Ambulatory Visit (HOSPITAL_COMMUNITY)
Admission: RE | Admit: 2022-12-19 | Discharge: 2022-12-19 | Disposition: A | Discharge status: Home / Self Care | Source: Ambulatory Visit | Attending: Family Medicine | Admitting: Family Medicine

## 2022-12-19 DIAGNOSIS — Z1231 Encounter for screening mammogram for malignant neoplasm of breast: Secondary | ICD-10-CM | POA: Diagnosis present

## 2022-12-20 ENCOUNTER — Encounter (HOSPITAL_COMMUNITY)
Admission: RE | Admit: 2022-12-20 | Discharge: 2022-12-20 | Disposition: A | Discharge status: Home / Self Care | Source: Ambulatory Visit | Attending: Internal Medicine | Admitting: Internal Medicine

## 2022-12-24 ENCOUNTER — Encounter (HOSPITAL_COMMUNITY)
Admission: RE | Disposition: A | Discharge status: Home / Self Care | Payer: Self-pay | Source: Ambulatory Visit | Attending: Internal Medicine

## 2022-12-24 ENCOUNTER — Ambulatory Visit (HOSPITAL_COMMUNITY)
Admission: RE | Admit: 2022-12-24 | Discharge: 2022-12-24 | Disposition: A | Discharge status: Home / Self Care | Source: Ambulatory Visit | Attending: Internal Medicine | Admitting: Internal Medicine

## 2022-12-24 ENCOUNTER — Ambulatory Visit (HOSPITAL_COMMUNITY): Admitting: Anesthesiology

## 2022-12-24 ENCOUNTER — Encounter (HOSPITAL_COMMUNITY): Payer: Self-pay

## 2022-12-24 ENCOUNTER — Ambulatory Visit (HOSPITAL_BASED_OUTPATIENT_CLINIC_OR_DEPARTMENT_OTHER): Admitting: Anesthesiology

## 2022-12-24 DIAGNOSIS — Q438 Other specified congenital malformations of intestine: Secondary | ICD-10-CM | POA: Diagnosis not present

## 2022-12-24 DIAGNOSIS — K5730 Diverticulosis of large intestine without perforation or abscess without bleeding: Secondary | ICD-10-CM | POA: Insufficient documentation

## 2022-12-24 DIAGNOSIS — K648 Other hemorrhoids: Secondary | ICD-10-CM | POA: Diagnosis not present

## 2022-12-24 DIAGNOSIS — F17210 Nicotine dependence, cigarettes, uncomplicated: Secondary | ICD-10-CM

## 2022-12-24 DIAGNOSIS — D125 Benign neoplasm of sigmoid colon: Secondary | ICD-10-CM

## 2022-12-24 DIAGNOSIS — Z86010 Personal history of colonic polyps: Secondary | ICD-10-CM

## 2022-12-24 DIAGNOSIS — I10 Essential (primary) hypertension: Secondary | ICD-10-CM

## 2022-12-24 DIAGNOSIS — E119 Type 2 diabetes mellitus without complications: Secondary | ICD-10-CM | POA: Insufficient documentation

## 2022-12-24 DIAGNOSIS — Z7984 Long term (current) use of oral hypoglycemic drugs: Secondary | ICD-10-CM | POA: Diagnosis not present

## 2022-12-24 DIAGNOSIS — K635 Polyp of colon: Secondary | ICD-10-CM

## 2022-12-24 DIAGNOSIS — G4733 Obstructive sleep apnea (adult) (pediatric): Secondary | ICD-10-CM | POA: Insufficient documentation

## 2022-12-24 DIAGNOSIS — Z1211 Encounter for screening for malignant neoplasm of colon: Secondary | ICD-10-CM | POA: Insufficient documentation

## 2022-12-24 HISTORY — PX: POLYPECTOMY: SHX149

## 2022-12-24 HISTORY — PX: COLONOSCOPY WITH PROPOFOL: SHX5780

## 2022-12-24 LAB — GLUCOSE, CAPILLARY: Glucose-Capillary: 119 mg/dL — ABNORMAL HIGH (ref 70–99)

## 2022-12-24 SURGERY — COLONOSCOPY WITH PROPOFOL
Anesthesia: General

## 2022-12-24 MED ORDER — PROPOFOL 10 MG/ML IV BOLUS
INTRAVENOUS | Status: DC | PRN
Start: 2022-12-24 — End: 2022-12-24
  Administered 2022-12-24: 20 mg via INTRAVENOUS
  Administered 2022-12-24: 40 mg via INTRAVENOUS
  Administered 2022-12-24: 120 mg via INTRAVENOUS
  Administered 2022-12-24: 40 mg via INTRAVENOUS

## 2022-12-24 MED ORDER — LIDOCAINE HCL (CARDIAC) PF 100 MG/5ML IV SOSY
PREFILLED_SYRINGE | INTRAVENOUS | Status: DC | PRN
  Administered 2022-12-24: 50 mg via INTRAVENOUS

## 2022-12-24 MED ORDER — LACTATED RINGERS IV SOLN: INTRAVENOUS | Status: DC

## 2022-12-24 NOTE — Op Note (Addendum)
[facility] Patient Name: Casey Johnston Procedure Date: 12/24/2022 10:37 AM MRN: [id number] Date of Birth: [birth year] Attending MD: Hennie Duos. Marletta Lor , [place], [id number] CSN: [id number] Age: 62 Admit Type: Outpatient Procedure:                Colonoscopy Indications:              Surveillance: Personal history of adenomatous                            polyps on last colonoscopy > 3 years ago Providers:                Hennie Duos. Marletta Lor, DO, Crystal Page, Pandora Leiter                            Tech., Technician Referring MD:              Medicines:                See the Anesthesia note for documentation of the                            administered medications Complications:            No immediate complications. Estimated Blood Loss:     Estimated blood loss was minimal. Procedure:                Pre-Anesthesia Assessment:                           - The anesthesia plan was to use monitored                            anesthesia care (MAC).                           After obtaining informed consent, the colonoscope                            was passed under direct vision. Throughout the                            procedure, the patient's blood pressure, pulse, and                            oxygen saturations were monitored continuously. The                            [id number]-HQ190L ([id number]) scope was introduced through                            the anus and advanced to the the cecum, identified                            by appendiceal orifice and ileocecal valve. The                            colonoscopy was performed  without difficulty. The                            quality of the bowel preparation was evaluated                            using the BBPS [facility] Bowel Preparation Scale)                            with scores of: Right Colon = 2 (minor amount of                            residual staining, small fragments of stool and/or                            opaque  liquid, but mucosa seen well), Transverse                            Colon = 2 (minor amount of residual staining, small                            fragments of stool and/or opaque liquid, but mucosa                            seen well) and Left Colon = 2 (minor amount of                            residual staining, small fragments of stool and/or                            opaque liquid, but mucosa seen well). The total                            BBPS score equals 6. Fair. The colonoscopy was                            technically difficult and complex due to a                            redundant colon and significant looping. Successful                            completion of the procedure was aided by applying                            abdominal pressure. Scope In: 10:52:56 AM Scope Out: 11:12:21 AM Scope Withdrawal Time: 0 hours 9 minutes 34 seconds  Total Procedure Duration: 0 hours 19 minutes 25 seconds  Findings:      Non-bleeding internal hemorrhoids were found during endoscopy.      Multiple large-mouthed and small-mouthed diverticula were found in the       sigmoid colon.      A 4 mm polyp was found in the sigmoid colon. The polyp  was sessile. The       polyp was removed with a cold snare. Resection and retrieval were       complete.      A moderate amount of stool was found in the entire colon, making       visualization difficult. Lavage of the area was performed using copious       amounts of sterile water, resulting in clearance with good visualization.      The exam was otherwise without abnormality. Impression:               - Non-bleeding internal hemorrhoids.                           - Diverticulosis in the sigmoid colon.                           - One 4 mm polyp in the sigmoid colon, removed with                            a cold snare. Resected and retrieved.                           - Stool in the entire examined colon.                           - The  examination was otherwise normal. Moderate Sedation:      Per Anesthesia Care Recommendation:           - Patient has a contact number available for                            emergencies. The signs and symptoms of potential                            delayed complications were discussed with the                            patient. Return to normal activities tomorrow.                            Written discharge instructions were provided to the                            patient.                           - Resume previous diet.                           - Continue present medications.                           - Await pathology results.                           - Repeat colonoscopy in 5 years for surveillance.                           -  Return to GI clinic in 6 months. Procedure Code(s):        --- Professional ---                           [phone], Colonoscopy, flexible; with removal of                            tumor(s), polyp(s), or other lesion(s) by snare                            technique Diagnosis Code(s):        --- Professional ---                           Z86.010, Personal history of colonic polyps                           K64.8, Other hemorrhoids                           D12.5, Benign neoplasm of sigmoid colon                           K57.30, Diverticulosis of large intestine without                            perforation or abscess without bleeding CPT copyright 2022 American Medical Association. All rights reserved. The codes documented in this report are preliminary and upon coder review may  be revised to meet current compliance requirements. Hennie Duos. Marletta Lor, DO Hennie Duos. Marletta Lor, DO 12/24/2022 11:17:47 AM This report has been signed electronically. Number of Addenda: 0

## 2022-12-24 NOTE — Anesthesia Preprocedure Evaluation (Addendum)
Anesthesia Evaluation  Patient identified by MRN, date of birth, ID band Patient awake    Reviewed: Allergy & Precautions, H&P , NPO status , Patient's Chart, lab work & pertinent test results, reviewed documented beta blocker date and time   Airway Mallampati: III  TM Distance: >3 FB Neck ROM: Full    Dental  (+) Dental Advisory Given, Missing   Pulmonary sleep apnea , Current Smoker and Patient abstained from smoking.   Pulmonary exam normal breath sounds clear to auscultation       Cardiovascular Exercise Tolerance: Good hypertension, Pt. on medications and Pt. on home beta blockers Normal cardiovascular exam Rhythm:Regular Rate:Normal     Neuro/Psych  Headaches PSYCHIATRIC DISORDERS Anxiety Depression       GI/Hepatic Neg liver ROS,GERD  Medicated and Controlled,,  Endo/Other  diabetes, Well Controlled, Type 2, Oral Hypoglycemic Agents    Renal/GU negative Renal ROS  negative genitourinary   Musculoskeletal negative musculoskeletal ROS (+)    Abdominal   Peds negative pediatric ROS (+)  Hematology negative hematology ROS (+)   Anesthesia Other Findings   Reproductive/Obstetrics negative OB ROS                             Anesthesia Physical Anesthesia Plan  ASA: 3  Anesthesia Plan: General   Post-op Pain Management: Minimal or no pain anticipated   Induction: Intravenous  PONV Risk Score and Plan: 1 and Propofol infusion  Airway Management Planned: Nasal Cannula and Natural Airway  Additional Equipment:   Intra-op Plan:   Post-operative Plan:   Informed Consent: I have reviewed the patients History and Physical, chart, labs and discussed the procedure including the risks, benefits and alternatives for the proposed anesthesia with the patient or authorized representative who has indicated his/her understanding and acceptance.     Dental advisory given  Plan Discussed  with: CRNA and Surgeon  Anesthesia Plan Comments:         Anesthesia Quick Evaluation

## 2022-12-24 NOTE — Anesthesia Procedure Notes (Signed)
Date/Time: 12/24/2022 10:51 AM  Performed by: Julian Reil, CRNAPre-anesthesia Checklist: Patient identified, Emergency Drugs available, Suction available and Patient being monitored Patient Re-evaluated:Patient Re-evaluated prior to induction Oxygen Delivery Method: Nasal cannula Induction Type: IV induction Placement Confirmation: positive ETCO2

## 2022-12-24 NOTE — Discharge Instructions (Addendum)
  Colonoscopy Discharge Instructions  Read the instructions outlined below and refer to this sheet in the next few weeks. These discharge instructions provide you with general information on caring for yourself after you leave the hospital. Your doctor may also give you specific instructions. While your treatment has been planned according to the most current medical practices available, unavoidable complications occasionally occur.   ACTIVITY You may resume your regular activity, but move at a slower pace for the next 24 hours.  Take frequent rest periods for the next 24 hours.  Walking will help get rid of the air and reduce the bloated feeling in your belly (abdomen).  No driving for 24 hours (because of the medicine (anesthesia) used during the test).   Do not sign any important legal documents or operate any machinery for 24 hours (because of the anesthesia used during the test).  NUTRITION Drink plenty of fluids.  You may resume your normal diet as instructed by your doctor.  Begin with a light meal and progress to your normal diet. Heavy or fried foods are harder to digest and may make you feel sick to your stomach (nauseated).  Avoid alcoholic beverages for 24 hours or as instructed.  MEDICATIONS You may resume your normal medications unless your doctor tells you otherwise.  WHAT YOU CAN EXPECT TODAY Some feelings of bloating in the abdomen.  Passage of more gas than usual.  Spotting of blood in your stool or on the toilet paper.  IF YOU HAD POLYPS REMOVED DURING THE COLONOSCOPY: No aspirin products for 7 days or as instructed.  No alcohol for 7 days or as instructed.  Eat a soft diet for the next 24 hours.  FINDING OUT THE RESULTS OF YOUR TEST Not all test results are available during your visit. If your test results are not back during the visit, make an appointment with your caregiver to find out the results. Do not assume everything is normal if you have not heard from your  caregiver or the medical facility. It is important for you to follow up on all of your test results.  SEEK IMMEDIATE MEDICAL ATTENTION IF: You have more than a spotting of blood in your stool.  Your belly is swollen (abdominal distention).  You are nauseated or vomiting.  You have a temperature over 101.  You have abdominal pain or discomfort that is severe or gets worse throughout the day.   Your colonoscopy revealed 1 polyp(s) which I removed successfully. Await pathology results, my office will contact you. I recommend repeating colonoscopy in 5 years for surveillance purposes.   You also have diverticulosis and internal hemorrhoids. I would recommend increasing fiber in your diet or adding OTC Benefiber/Metamucil. Be sure to drink at least 4 to 6 glasses of water daily. Follow-up with GI in 6 months. Message sent to office and will contact you about follow up appointment.    I hope you have a great rest of your week!  Hennie Duos. Marletta Lor, D.O. Gastroenterology and Hepatology [facility]

## 2022-12-24 NOTE — Anesthesia Postprocedure Evaluation (Signed)
Anesthesia Post Note  Patient: Casey Johnston  Procedure(s) Performed: COLONOSCOPY WITH PROPOFOL POLYPECTOMY INTESTINAL  Patient location during evaluation: Phase II Anesthesia Type: General Level of consciousness: awake and alert and oriented Pain management: pain level controlled Vital Signs Assessment: post-procedure vital signs reviewed and stable Respiratory status: spontaneous breathing, nonlabored ventilation and respiratory function stable Cardiovascular status: blood pressure returned to baseline and stable Postop Assessment: no apparent nausea or vomiting Anesthetic complications: no  No notable events documented.   Last Vitals:  Vitals:   12/24/22 1117 12/24/22 1120  BP: (!) 98/54 118/62  Pulse: 68   Resp: (!) 21   Temp: 36.7 C   SpO2: 100%     Last Pain:  Vitals:   12/24/22 1117  TempSrc: Oral  PainSc: 0-No pain                 Casey Johnston

## 2022-12-24 NOTE — Transfer of Care (Signed)
Immediate Anesthesia Transfer of Care Note  Patient: Casey Johnston  Procedure(s) Performed: COLONOSCOPY WITH PROPOFOL POLYPECTOMY INTESTINAL  Patient Location: Short Stay  Anesthesia Type:General  Level of Consciousness: awake  Airway & Oxygen Therapy: Patient Spontanous Breathing  Post-op Assessment: Report given to RN and Post -op Vital signs reviewed and stable  Post vital signs: Reviewed and stable  Last Vitals:  Vitals Value Taken Time  BP 98/54 12/24/22 1117  Temp 36.7 C 12/24/22 1117  Pulse 68 12/24/22 1117  Resp 21 12/24/22 1117  SpO2 100 % 12/24/22 1117    Last Pain:  Vitals:   12/24/22 1117  TempSrc: Oral  PainSc: 0-No pain         Complications: No notable events documented.

## 2022-12-24 NOTE — H&P (Signed)
Primary Care Physician:  Waldon Reining, MD Primary Gastroenterologist:  Dr. Marletta Lor  Pre-Procedure History & Physical: HPI:  Casey Johnston is a 62 y.o. female is here for a colonoscopy to be performed for surveillance purposes, personal history of adenomatous colon polyps in 2020  Past Medical History:  Diagnosis Date   Anxiety and depression    Chronic constipation    Diabetes mellitus, type II (HCC)    Dr. Fransico Him managing   Frequent headaches chronic   ibuprofen helps some   GERD (gastroesophageal reflux disease)    History of UTI    Recurrent per pt (approx 3 per year)   Hyperlipidemia    Hypertension    Leukocytosis 12/2015   mild lymphocytosis.  Path smear review reassuring.  Repeat 01/2016 stable.   Morbid obesity (HCC)    OSA on CPAP    not using CPAP anymore    Past Surgical History:  Procedure Laterality Date   ABDOMINAL HYSTERECTOMY  1998   Dysfunctional uterine bleeding.  No hx of abnormal paps.   BIOPSY  10/20/2018   Procedure: BIOPSY;  Surgeon: West Bali, MD;  [facility];  Service: Endoscopy;;  stomach   CATARACT EXTRACTION W/PHACO Left 08/24/2022   Procedure: CATARACT EXTRACTION PHACO AND INTRAOCULAR LENS PLACEMENT (IOC);  Surgeon: Fabio Pierce, MD;  Location: [facility];  Service: Ophthalmology;  Laterality: Left;  CDE 8.53   CATARACT EXTRACTION W/PHACO Right 09/07/2022   Procedure: CATARACT EXTRACTION PHACO AND INTRAOCULAR LENS PLACEMENT (IOC);  Surgeon: Fabio Pierce, MD;  Location: [facility];  Service: Ophthalmology;  Laterality: Right;  CDE: 10.02   COLONOSCOPY  2012   Dr. Samuella Cota: 1.5 cm pendunculated descending colon polyp (tubulovillous adenoma) and diminutive hyperplastic polyps   COLONOSCOPY N/A 10/20/2018   Procedure: COLONOSCOPY;  Surgeon: West Bali, MD; 4 tubular adenomas, diverticulosis in the sigmoid colon, tortuous left colon, external and internal hemorrhoids. Due for repeat in 2023.    ESOPHAGOGASTRODUODENOSCOPY N/A 10/20/2018    Procedure: ESOPHAGOGASTRODUODENOSCOPY (EGD);  Surgeon: West Bali, MD;  gastritis and duodenitis. No H. Pylori.    POLYPECTOMY  10/20/2018   Procedure: POLYPECTOMY;  Surgeon: West Bali, MD;  [facility];  Service: Endoscopy;;   wart removal  08/04/2019   benign    Prior to Admission medications   Medication Sig Start Date End Date Taking? Authorizing Provider  amLODipine (NORVASC) 10 MG tablet Take 10 mg by mouth daily. 11/09/22  Yes [provider]  atorvastatin (LIPITOR) 20 MG tablet TAKE 1 Tablet BY MOUTH ONCE EVERY DAY 10/05/21  Yes Jacquelin Hawking, PA-C  Dulaglutide (TRULICITY) 0.75 MG/0.5ML SOPN Inject 0.75 mg into the skin every Wednesday.   Yes [provider]  glipiZIDE (GLUCOTROL XL) 5 MG 24 hr tablet Take 1 tablet (5 mg total) by mouth daily with breakfast. 10/18/22  Yes Nida, Denman George, MD  linaclotide (LINZESS) 72 MCG capsule Take 72 mcg by mouth daily as needed (constipation).   Yes [provider]  losartan-hydrochlorothiazide (HYZAAR) 100-12.5 MG tablet Take 1 tablet by mouth daily. 11/09/22  Yes [provider]  metFORMIN (GLUCOPHAGE) 1000 MG tablet Take 1 tablet (1,000 mg total) by mouth 2 (two) times daily with a meal. 10/05/21  Yes Jacquelin Hawking, PA-C  cloNIDine (CATAPRES) 0.1 MG tablet Take 1 tablet (0.1 mg total) by mouth 2 (two) times daily. 10/05/21   Jacquelin Hawking, PA-C  metoprolol tartrate (LOPRESSOR) 100 MG tablet Take 1 tablet (100 mg total) by mouth 2 (two)  times daily. Patient taking differently: Take 100 mg by mouth daily. 10/05/21   Jacquelin Hawking, PA-C    Allergies as of 12/10/2022 - Review Complete 11/29/2022  Allergen Reaction Noted   Amlodipine Swelling 10/19/2020   Januvia [sitagliptin] Other (See Comments) 05/21/2018    Family History  Problem Relation Age of Onset   Cancer Mother        ovarian   COPD Father    Diabetes Father    Hyperlipidemia Father    Heart attack Father     Heart failure Father    Colon cancer Neg Hx     Social History   Socioeconomic History   Marital status: Divorced    Spouse name: Not on file   Number of children: Not on file   Years of education: Not on file   Highest education level: Not on file  Occupational History   Not on file  Tobacco Use   Smoking status: Every Day    Current packs/day: 0.25    Average packs/day: 0.3 packs/day for 20.0 years (5.0 ttl pk-yrs)    Types: Cigarettes   Smokeless tobacco: Never  Vaping Use   Vaping status: Never Used  Substance and Sexual Activity   Alcohol use: No   Drug use: No   Sexual activity: Not on file  Other Topics Concern   Not on file  Social History Narrative   Separated x 20 yrs, one daughter.  She lives with her.  Great niece and nephew live with her as well.   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   Tob: 7 pack year hx, quit 2010.   Alc: none   Exercise: none   Social Determinants of Corporate investment banker Strain: Not on file  Food Insecurity: Not on file  Transportation Needs: Not on file  Physical Activity: Not on file  Stress: Not on file  Social Connections: Not on file  Intimate Partner Violence: Not on file    Review of Systems: See HPI, otherwise negative ROS  Physical Exam: Vital signs in last 24 hours: Temp:  [98.1 F (36.7 C)] 98.1 F (36.7 C) (08/05 0946) Pulse Rate:  [92] 92 (08/05 0946) Resp:  [27] 27 (08/05 0946) SpO2:  [100 %] 100 % (08/05 0946) Weight:  [99.3 kg] 99.3 kg (08/05 0946)   General:   Alert,  Well-developed, well-nourished, pleasant and cooperative in NAD Head:  Normocephalic and atraumatic. Eyes:  Sclera clear, no icterus.   Conjunctiva pink. Ears:  Normal auditory acuity. Nose:  No deformity, discharge,  or lesions. Msk:  Symmetrical without gross deformities. Normal posture. Extremities:  Without clubbing or edema. Neurologic:  Alert and  oriented x4;  grossly normal neurologically. Skin:  Intact without  significant lesions or rashes. Psych:  Alert and cooperative. Normal mood and affect.  Impression/Plan: [name] is here for a colonoscopy to be performed for surveillance purposes, personal history of adenomatous colon polyps in 2020  The risks of the procedure including infection, bleed, or perforation as well as benefits, limitations, alternatives and imponderables have been reviewed with the patient. Questions have been answered. All parties agreeable.

## 2022-12-27 ENCOUNTER — Encounter (HOSPITAL_COMMUNITY): Payer: Self-pay | Admitting: Internal Medicine

## 2023-01-04 ENCOUNTER — Other Ambulatory Visit: Payer: Self-pay | Admitting: "Endocrinology

## 2023-01-04 DIAGNOSIS — E1165 Type 2 diabetes mellitus with hyperglycemia: Secondary | ICD-10-CM

## 2023-01-25 ENCOUNTER — Telehealth: Payer: Self-pay | Admitting: "Endocrinology

## 2023-01-25 NOTE — Telephone Encounter (Signed)
Called and let pt know that pt assistance for lantus was here

## 2023-01-30 ENCOUNTER — Telehealth: Payer: Self-pay | Admitting: "Endocrinology

## 2023-01-30 NOTE — Telephone Encounter (Signed)
Called pt to let her know pt assistance is here

## 2023-02-05 NOTE — Telephone Encounter (Signed)
Pt picked up her pt assistance

## 2023-02-18 ENCOUNTER — Ambulatory Visit: Admitting: "Endocrinology

## 2023-03-08 ENCOUNTER — Ambulatory Visit: Admitting: Gastroenterology

## 2023-06-17 ENCOUNTER — Ambulatory Visit: Admitting: "Endocrinology

## 2023-06-23 NOTE — Progress Notes (Unsigned)
Referring Provider: Waldon Reining, MD Primary Care Physician:  Waldon Reining, MD Primary GI Physician: Dr. Marletta Lor  No chief complaint on file.   HPI:   Casey Johnston is a 63 y.o. female presenting today with a history of  with history of GERD, constipation, adenomatous colon polyps, chronic abdominal pain that seems to be associated with underlying constipation, likely IBS-C.  Also with history of benign epidermoid cyst in the right lower anterior abdominal wall.  She is presenting today for routine follow-up.  Today: GERD:    IBS-C: Previously failed Amitiza 24 mcg twice daily and MiraLAX daily.  Currently***    Colonoscopy 12/24/2022: Nonbleeding internal hemorrhoids, multiple diverticula in the sigmoid colon, 4 mm polyp in the sigmoid colon resected and retrieved, moderate amount of stool in the entire colon s/p lavage resulting in clearance with good visualization.  Pathology showed hyperplastic polyp, but recommended repeat colonoscopy in 5 years.  History of adenomatous colon polyps.    Past Medical History:  Diagnosis Date   Anxiety and depression    Chronic constipation    Diabetes mellitus, type II (HCC)    Dr. Fransico Him managing   Frequent headaches chronic   ibuprofen helps some   GERD (gastroesophageal reflux disease)    History of UTI    Recurrent per pt (approx 3 per year)   Hyperlipidemia    Hypertension    Leukocytosis 12/2015   mild lymphocytosis.  Path smear review reassuring.  Repeat 01/2016 stable.   Morbid obesity (HCC)    OSA on CPAP    not using CPAP anymore    Past Surgical History:  Procedure Laterality Date   ABDOMINAL HYSTERECTOMY  1998   Dysfunctional uterine bleeding.  No hx of abnormal paps.   BIOPSY  10/20/2018   Procedure: BIOPSY;  Surgeon: West Bali, MD;  [facility];  Service: Endoscopy;;  stomach   CATARACT EXTRACTION W/PHACO Left 08/24/2022   Procedure: CATARACT EXTRACTION PHACO AND INTRAOCULAR LENS  PLACEMENT (IOC);  Surgeon: Fabio Pierce, MD;  Location: [facility];  Service: Ophthalmology;  Laterality: Left;  CDE 8.53   CATARACT EXTRACTION W/PHACO Right 09/07/2022   Procedure: CATARACT EXTRACTION PHACO AND INTRAOCULAR LENS PLACEMENT (IOC);  Surgeon: Fabio Pierce, MD;  Location: [facility];  Service: Ophthalmology;  Laterality: Right;  CDE: 10.02   COLONOSCOPY  2012   Dr. Samuella Cota: 1.5 cm pendunculated descending colon polyp (tubulovillous adenoma) and diminutive hyperplastic polyps   COLONOSCOPY N/A 10/20/2018   Procedure: COLONOSCOPY;  Surgeon: West Bali, MD; 4 tubular adenomas, diverticulosis in the sigmoid colon, tortuous left colon, external and internal hemorrhoids. Due for repeat in 2023.    COLONOSCOPY WITH PROPOFOL N/A 12/24/2022   Procedure: COLONOSCOPY WITH PROPOFOL;  Surgeon: Lanelle Bal, DO;  [facility];  Service: Endoscopy;  Laterality: N/A;  11:15 am, asa 3   ESOPHAGOGASTRODUODENOSCOPY N/A 10/20/2018   Procedure: ESOPHAGOGASTRODUODENOSCOPY (EGD);  Surgeon: West Bali, MD;  gastritis and duodenitis. No H. Pylori.    POLYPECTOMY  10/20/2018   Procedure: POLYPECTOMY;  Surgeon: West Bali, MD;  [facility];  Service: Endoscopy;;   POLYPECTOMY  12/24/2022   Procedure: POLYPECTOMY INTESTINAL;  Surgeon: Lanelle Bal, DO;  [facility];  Service: Endoscopy;;   wart removal  08/04/2019   benign    Current Outpatient Medications  Medication Sig Dispense Refill   amLODipine (NORVASC) 10 MG tablet Take 10 mg by mouth daily.     atorvastatin (  LIPITOR) 20 MG tablet TAKE 1 Tablet BY MOUTH ONCE EVERY DAY 30 tablet 0   cloNIDine (CATAPRES) 0.1 MG tablet Take 1 tablet (0.1 mg total) by mouth 2 (two) times daily. 60 tablet 0   Dulaglutide (TRULICITY) 0.75 MG/0.5ML SOPN Inject 0.75 mg into the skin every Wednesday.     glipiZIDE (GLUCOTROL XL) 5 MG 24 hr tablet Take 1 tablet (5 mg total) by mouth daily with breakfast. 90 tablet 1   glipiZIDE  (GLUCOTROL) 5 MG tablet TAKE 1 TABLET BY MOUTH ONCE DAILY BEFORE BREAKFAST 90 tablet 2   linaclotide (LINZESS) 72 MCG capsule Take 72 mcg by mouth daily as needed (constipation).     losartan-hydrochlorothiazide (HYZAAR) 100-12.5 MG tablet Take 1 tablet by mouth daily.     metFORMIN (GLUCOPHAGE) 1000 MG tablet Take 1 tablet (1,000 mg total) by mouth 2 (two) times daily with a meal. 60 tablet 0   metoprolol tartrate (LOPRESSOR) 100 MG tablet Take 1 tablet (100 mg total) by mouth 2 (two) times daily. (Patient taking differently: Take 100 mg by mouth daily.) 60 tablet 0   No current facility-administered medications for this visit.    Allergies as of 06/24/2023 - Review Complete 12/24/2022  Allergen Reaction Noted   Amlodipine Swelling 10/19/2020   Januvia [sitagliptin] Other (See Comments) 05/21/2018    Family History  Problem Relation Age of Onset   Cancer Mother        ovarian   COPD Father    Diabetes Father    Hyperlipidemia Father    Heart attack Father    Heart failure Father    Colon cancer Neg Hx     Social History   Socioeconomic History   Marital status: Divorced    Spouse name: Not on file   Number of children: Not on file   Years of education: Not on file   Highest education level: Not on file  Occupational History   Not on file  Tobacco Use   Smoking status: Every Day    Current packs/day: 0.25    Average packs/day: 0.3 packs/day for 20.0 years (5.0 ttl pk-yrs)    Types: Cigarettes   Smokeless tobacco: Never  Vaping Use   Vaping status: Never Used  Substance and Sexual Activity   Alcohol use: No   Drug use: No   Sexual activity: Not on file  Other Topics Concern   Not on file  Social History Narrative   Separated x 20 yrs, one daughter.  She lives with her.  Great niece and nephew live with her as well.   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   Tob: 7 pack year hx, quit 2010.   Alc: none   Exercise: none   Social Drivers of Research scientist (physical sciences) Strain: Low Risk  (03/26/2023)   Received from [facility]   Overall Financial Resource Strain (CARDIA)    Difficulty of Paying Living Expenses: Not hard at all  Food Insecurity: No Food Insecurity (03/26/2023)   Received from [facility]   Hunger Vital Sign    Worried About Running Out of Food in the Last Year: Never true    Ran Out of Food in the Last Year: Never true  Transportation Needs: No Transportation Needs (03/26/2023)   Received from [facility] - Transportation    Lack of Transportation (Medical): No    Lack of Transportation (Non-Medical): No  Physical Activity: Sufficiently Active (03/26/2023)   Received from [facility]   Exercise Vital Sign    Days of Exercise per Week: 6 days    Minutes of Exercise per Session: 30 min  Stress: Stress Concern Present (03/26/2023)   Received from [facility] - Occupational Stress Questionnaire    Feeling of Stress : To some extent  Social Connections: Moderately Isolated (03/26/2023)   Received from [facility]   Social Connection and Isolation Panel [NHANES]    Frequency of Communication with Friends and Family: Once a week    Frequency of Social Gatherings with Friends and Family: Three times a week    Attends Religious Services: More than 4 times per year    Active Member of Clubs or Organizations: No    Attends Banker Meetings: Never    Marital Status: Divorced    Review of Systems: Gen: Denies fever, chills, anorexia. Denies fatigue, weakness, weight loss.  CV: Denies chest pain, palpitations, syncope, peripheral edema, and claudication. Resp: Denies dyspnea at rest, cough, wheezing, coughing up blood, and pleurisy. GI: Denies vomiting blood, jaundice, and fecal incontinence.   Denies dysphagia or odynophagia. Derm: Denies rash, itching, dry skin Psych: Denies depression, anxiety, memory loss, confusion. No homicidal or suicidal  ideation.  Heme: Denies bruising, bleeding, and enlarged lymph nodes.  Physical Exam: There were no vitals taken for this visit. General:   Alert and oriented. No distress noted. Pleasant and cooperative.  Head:  Normocephalic and atraumatic. Eyes:  Conjuctiva clear without scleral icterus. Heart:  S1, S2 present without murmurs appreciated. Lungs:  Clear to auscultation bilaterally. No wheezes, rales, or rhonchi. No distress.  Abdomen:  +BS, soft, non-tender and non-distended. No rebound or guarding. No HSM or masses noted. Msk:  Symmetrical without gross deformities. Normal posture. Extremities:  Without edema. Neurologic:  Alert and  oriented x4 Psych:  Normal mood and affect.    Assessment:     Plan:  ***   Ermalinda Memos, PA-C [facility] 06/24/2023

## 2023-06-24 ENCOUNTER — Ambulatory Visit (INDEPENDENT_AMBULATORY_CARE_PROVIDER_SITE_OTHER): Admitting: Gastroenterology

## 2023-06-24 ENCOUNTER — Encounter: Payer: Self-pay | Admitting: Gastroenterology

## 2023-06-24 VITALS — BP 91/59 | HR 73 | Temp 97.6°F | Ht 65.0 in | Wt 201.8 lb

## 2023-06-24 DIAGNOSIS — K219 Gastro-esophageal reflux disease without esophagitis: Secondary | ICD-10-CM

## 2023-06-24 DIAGNOSIS — K5909 Other constipation: Secondary | ICD-10-CM

## 2023-06-24 DIAGNOSIS — I959 Hypotension, unspecified: Secondary | ICD-10-CM

## 2023-06-24 NOTE — Patient Instructions (Signed)
Continue taking Linzess as needed for constipation.  As we discussed your blood pressure was a little on the low side today at 93/57.  I am having Courtney recheck your blood pressure prior to leaving the office to see if that has improved.  I do recommend that you follow-up with your primary care doctor on this as her blood pressure medications may need to be adjusted.  I would also encourage that you get a blood pressure cuff from any drugstore to keep a check on your blood pressure at home on a regular basis. This may also be helpful for your primary care doctor to see what your blood pressure readings are on a daily basis.    I will plan to see you back in 6 months or sooner if needed.   Ermalinda Memos, PA-C [facility]

## 2023-12-19 ENCOUNTER — Encounter: Payer: Self-pay | Admitting: Gastroenterology

## 2024-04-04 ENCOUNTER — Inpatient Hospital Stay (HOSPITAL_COMMUNITY)
Admission: EM | Admit: 2024-04-04 | Discharge: 2024-04-12 | DRG: 377 | Disposition: A | Discharge status: Home / Self Care | Attending: Hospitalist | Admitting: Hospitalist

## 2024-04-04 ENCOUNTER — Other Ambulatory Visit: Payer: Self-pay

## 2024-04-04 ENCOUNTER — Encounter (HOSPITAL_COMMUNITY): Payer: Self-pay | Admitting: Emergency Medicine

## 2024-04-04 DIAGNOSIS — F17210 Nicotine dependence, cigarettes, uncomplicated: Secondary | ICD-10-CM | POA: Diagnosis present

## 2024-04-04 DIAGNOSIS — E669 Obesity, unspecified: Secondary | ICD-10-CM | POA: Insufficient documentation

## 2024-04-04 DIAGNOSIS — I10 Essential (primary) hypertension: Secondary | ICD-10-CM | POA: Diagnosis not present

## 2024-04-04 DIAGNOSIS — K264 Chronic or unspecified duodenal ulcer with hemorrhage: Secondary | ICD-10-CM | POA: Diagnosis present

## 2024-04-04 DIAGNOSIS — K5730 Diverticulosis of large intestine without perforation or abscess without bleeding: Secondary | ICD-10-CM | POA: Diagnosis present

## 2024-04-04 DIAGNOSIS — G4733 Obstructive sleep apnea (adult) (pediatric): Secondary | ICD-10-CM | POA: Diagnosis not present

## 2024-04-04 DIAGNOSIS — Z8249 Family history of ischemic heart disease and other diseases of the circulatory system: Secondary | ICD-10-CM | POA: Diagnosis not present

## 2024-04-04 DIAGNOSIS — Z7984 Long term (current) use of oral hypoglycemic drugs: Secondary | ICD-10-CM

## 2024-04-04 DIAGNOSIS — E66811 Obesity, class 1: Secondary | ICD-10-CM | POA: Diagnosis present

## 2024-04-04 DIAGNOSIS — Z7985 Long-term (current) use of injectable non-insulin antidiabetic drugs: Secondary | ICD-10-CM

## 2024-04-04 DIAGNOSIS — Z888 Allergy status to other drugs, medicaments and biological substances status: Secondary | ICD-10-CM | POA: Diagnosis not present

## 2024-04-04 DIAGNOSIS — Z825 Family history of asthma and other chronic lower respiratory diseases: Secondary | ICD-10-CM

## 2024-04-04 DIAGNOSIS — E1165 Type 2 diabetes mellitus with hyperglycemia: Secondary | ICD-10-CM | POA: Diagnosis present

## 2024-04-04 DIAGNOSIS — K31819 Angiodysplasia of stomach and duodenum without bleeding: Secondary | ICD-10-CM

## 2024-04-04 DIAGNOSIS — Z79899 Other long term (current) drug therapy: Secondary | ICD-10-CM | POA: Diagnosis not present

## 2024-04-04 DIAGNOSIS — K219 Gastro-esophageal reflux disease without esophagitis: Secondary | ICD-10-CM | POA: Diagnosis not present

## 2024-04-04 DIAGNOSIS — E872 Acidosis, unspecified: Secondary | ICD-10-CM | POA: Diagnosis present

## 2024-04-04 DIAGNOSIS — Z8349 Family history of other endocrine, nutritional and metabolic diseases: Secondary | ICD-10-CM | POA: Diagnosis not present

## 2024-04-04 DIAGNOSIS — D649 Anemia, unspecified: Principal | ICD-10-CM

## 2024-04-04 DIAGNOSIS — E782 Mixed hyperlipidemia: Secondary | ICD-10-CM | POA: Diagnosis not present

## 2024-04-04 DIAGNOSIS — K254 Chronic or unspecified gastric ulcer with hemorrhage: Secondary | ICD-10-CM | POA: Diagnosis present

## 2024-04-04 DIAGNOSIS — Z6833 Body mass index (BMI) 33.0-33.9, adult: Secondary | ICD-10-CM

## 2024-04-04 DIAGNOSIS — D62 Acute posthemorrhagic anemia: Secondary | ICD-10-CM | POA: Diagnosis present

## 2024-04-04 DIAGNOSIS — K31811 Angiodysplasia of stomach and duodenum with bleeding: Secondary | ICD-10-CM | POA: Diagnosis not present

## 2024-04-04 DIAGNOSIS — R578 Other shock: Secondary | ICD-10-CM | POA: Diagnosis not present

## 2024-04-04 DIAGNOSIS — Z833 Family history of diabetes mellitus: Secondary | ICD-10-CM

## 2024-04-04 DIAGNOSIS — K648 Other hemorrhoids: Secondary | ICD-10-CM | POA: Diagnosis not present

## 2024-04-04 DIAGNOSIS — E869 Volume depletion, unspecified: Secondary | ICD-10-CM | POA: Diagnosis not present

## 2024-04-04 LAB — PREPARE RBC (CROSSMATCH)

## 2024-04-04 LAB — URINALYSIS, W/ REFLEX TO CULTURE (INFECTION SUSPECTED)
Bilirubin Urine: NEGATIVE
Glucose, UA: NEGATIVE mg/dL
Ketones, ur: NEGATIVE mg/dL
Leukocytes,Ua: NEGATIVE
Nitrite: NEGATIVE
Protein, ur: NEGATIVE mg/dL
Specific Gravity, Urine: 1.019 (ref 1.005–1.030)
pH: 5 (ref 5.0–8.0)

## 2024-04-04 LAB — CBC
HCT: 23.4 % — ABNORMAL LOW (ref 36.0–46.0)
Hemoglobin: 7.3 g/dL — ABNORMAL LOW (ref 12.0–15.0)
MCH: 27.9 pg (ref 26.0–34.0)
MCHC: 31.2 g/dL (ref 30.0–36.0)
MCV: 89.3 fL (ref 80.0–100.0)
Platelets: 272 K/uL (ref 150–400)
RBC: 2.62 MIL/uL — ABNORMAL LOW (ref 3.87–5.11)
RDW: 14.5 % (ref 11.5–15.5)
WBC: 15 K/uL — ABNORMAL HIGH (ref 4.0–10.5)
nRBC: 0 % (ref 0.0–0.2)

## 2024-04-04 LAB — COMPREHENSIVE METABOLIC PANEL WITH GFR
ALT: 13 U/L (ref 0–44)
AST: 22 U/L (ref 15–41)
Albumin: 3.8 g/dL (ref 3.5–5.0)
Alkaline Phosphatase: 68 U/L (ref 38–126)
Anion gap: 13 (ref 5–15)
BUN: 44 mg/dL — ABNORMAL HIGH (ref 8–23)
CO2: 23 mmol/L (ref 22–32)
Calcium: 8.4 mg/dL — ABNORMAL LOW (ref 8.9–10.3)
Chloride: 104 mmol/L (ref 98–111)
Creatinine, Ser: 1.09 mg/dL — ABNORMAL HIGH (ref 0.44–1.00)
GFR, Estimated: 56 mL/min — ABNORMAL LOW (ref >60)
Glucose, Bld: 197 mg/dL — ABNORMAL HIGH (ref 70–99)
Potassium: 4.4 mmol/L (ref 3.5–5.1)
Sodium: 140 mmol/L (ref 135–145)
Total Bilirubin: 0.2 mg/dL (ref 0.0–1.2)
Total Protein: 6.7 g/dL (ref 6.5–8.1)

## 2024-04-04 LAB — CBG MONITORING, ED: Glucose-Capillary: 196 mg/dL — ABNORMAL HIGH (ref 70–99)

## 2024-04-04 LAB — LACTIC ACID, PLASMA: Lactic Acid, Venous: 2.9 mmol/L (ref 0.5–1.9)

## 2024-04-04 LAB — PROTIME-INR
INR: 1 (ref 0.8–1.2)
Prothrombin Time: 14 s (ref 11.4–15.2)

## 2024-04-04 LAB — ABO/RH: ABO/RH(D): O POS

## 2024-04-04 LAB — TROPONIN T, HIGH SENSITIVITY
Troponin T High Sensitivity: 16 ng/L (ref 0–19)
Troponin T High Sensitivity: 17 ng/L (ref 0–19)

## 2024-04-04 LAB — HEMOGLOBIN A1C
Hgb A1c MFr Bld: 6.8 % — ABNORMAL HIGH (ref 4.8–5.6)
Mean Plasma Glucose: 148.46 mg/dL

## 2024-04-04 LAB — MRSA NEXT GEN BY PCR, NASAL: MRSA by PCR Next Gen: NOT DETECTED

## 2024-04-04 LAB — HEMOGLOBIN AND HEMATOCRIT, BLOOD
HCT: 22.9 % — ABNORMAL LOW (ref 36.0–46.0)
Hemoglobin: 7.7 g/dL — ABNORMAL LOW (ref 12.0–15.0)

## 2024-04-04 MED ORDER — PANTOPRAZOLE SODIUM 40 MG IV SOLR
40.0000 mg | Freq: Two times a day (BID) | INTRAVENOUS | Status: DC
  Administered 2024-04-04 – 2024-04-12 (×16): 40 mg via INTRAVENOUS
  Filled 2024-04-04 (×16): qty 10

## 2024-04-04 MED ORDER — PANTOPRAZOLE SODIUM 40 MG IV SOLR
80.0000 mg | Freq: Once | INTRAVENOUS | Status: AC
  Administered 2024-04-04: 80 mg via INTRAVENOUS
  Filled 2024-04-04: qty 20

## 2024-04-04 MED ORDER — ONDANSETRON HCL 4 MG PO TABS: 4.0000 mg | ORAL_TABLET | Freq: Four times a day (QID) | ORAL | Status: DC | PRN

## 2024-04-04 MED ORDER — ACETAMINOPHEN 325 MG PO TABS
650.0000 mg | ORAL_TABLET | Freq: Four times a day (QID) | ORAL | Status: DC | PRN
Start: 2024-04-04 — End: 2024-04-12

## 2024-04-04 MED ORDER — ACETAMINOPHEN 650 MG RE SUPP: 650.0000 mg | Freq: Four times a day (QID) | RECTAL | Status: DC | PRN

## 2024-04-04 MED ORDER — SODIUM CHLORIDE 0.9% IV SOLUTION: Freq: Once | INTRAVENOUS | Status: AC

## 2024-04-04 MED ORDER — ATORVASTATIN CALCIUM 20 MG PO TABS
20.0000 mg | ORAL_TABLET | Freq: Every day | ORAL | Status: DC
  Administered 2024-04-05 – 2024-04-12 (×7): 20 mg via ORAL
  Filled 2024-04-04 (×8): qty 1

## 2024-04-04 MED ORDER — ONDANSETRON HCL 4 MG/2ML IJ SOLN
4.0000 mg | Freq: Four times a day (QID) | INTRAMUSCULAR | Status: DC | PRN
  Administered 2024-04-05 – 2024-04-06 (×2): 4 mg via INTRAVENOUS
  Filled 2024-04-04 (×2): qty 2

## 2024-04-04 MED ORDER — SODIUM CHLORIDE 0.9% IV SOLUTION: Freq: Once | INTRAVENOUS | Status: DC

## 2024-04-04 MED ORDER — SODIUM CHLORIDE 0.9 % IV BOLUS
1000.0000 mL | Freq: Once | INTRAVENOUS | Status: AC
  Administered 2024-04-04: 1000 mL via INTRAVENOUS

## 2024-04-04 MED ORDER — MELATONIN 3 MG PO TABS
3.0000 mg | ORAL_TABLET | Freq: Every evening | ORAL | Status: DC | PRN
  Administered 2024-04-04 – 2024-04-06 (×2): 3 mg via ORAL
  Filled 2024-04-04 (×2): qty 1

## 2024-04-04 MED ORDER — CHLORHEXIDINE GLUCONATE CLOTH 2 % EX PADS
6.0000 | MEDICATED_PAD | Freq: Every day | CUTANEOUS | Status: DC
  Administered 2024-04-04 – 2024-04-12 (×8): 6 via TOPICAL

## 2024-04-04 NOTE — Progress Notes (Signed)
 Brief GI update now   This is a 63 year old female with hypertension, diabetes, GERD, IBS-C presented with hematochezia and melena.  GI is consulted for acute posthemorrhagic anemia with melena concerning for upper GI bleed  As per chart review patient started noticed black-colored stools for past 3 days sometimes black in color and dark-colored stools  Labs  Initially with elevated BUN/creatinine of 44/1.09 Hemoglobin 7.3 with baseline of 12 Leukocytosis of 15  Previous imaging demonstrating descending colon diverticulosis  Patient is up-to-date to colonoscopy with Dr. Cindie 12/2023 - Non- bleeding internal hemorrhoids. - Diverticulosis in the sigmoid colon. - One 4 mm polyp in the sigmoid colon, removed with a cold snare. Resected and retrieved. - Stool in the entire examined colon. - The examination was otherwise normal. Suggest repeat 5 years  EGD 2020  - MILD Gastritis. Biopsied. - A single duodenal polyp. Resected and retrieved.  #MELENA #Acute post hemorrhagic anemia  With evidence of elevated BUN/creatinine ratio >3 and drop in hemoglobin this is concerning for upper GI bleed which could be due to peptic ulcer disease, angioectasia, Dieulafoy lesion  Although fortunately BUN/creatinine ratio has normalized and no further overt bleeding reported in the hospital  Recommend full liquid diet today and n.p.o. past midnight Plan for upper endoscopy tomorrow IV PPI twice daily Transfuse to keep hemoglobin more than 7 CBC Q12  Full consult note to follow

## 2024-04-04 NOTE — Consult Note (Signed)
 Tamasha Laplante [name] Sharnita Bogucki, M.D. Gastroenterology & Hepatology                                           Patient Name: Casey Johnston Account #: @FLAACCTNO @   MRN: [id number] Admission Date: 04/04/2024 Date of Evaluation:  04/04/2024 Time of Evaluation: 1:58 PM  Chief Complaint:  Anemia, black color stool  HPI:  This is a 63 year old female with hypertension, diabetes, GERD, IBS-C presented with hematochezia and melena. GI is consulted for acute posthemorrhagic anemia with melena concerning for upper GI bleed    patient started noticed black-colored stools for past 3 days sometimes black in color and dark-colored stools.Denies any NSAIDs. Denies any abdominal pain , nausea , vomiting   Initially with elevated BUN/creatinine of 44/1.09 Hemoglobin 7.3 with baseline of 12 Leukocytosis of 15   Previous imaging demonstrating descending colon diverticulosis   Patient is up-to-date to colonoscopy with Dr. Cindie 12/2023 - Non- bleeding internal hemorrhoids. - Diverticulosis in the sigmoid colon. - One 4 mm polyp in the sigmoid colon, removed with a cold snare. Resected and retrieved. - Stool in the entire examined colon. - The examination was otherwise normal. Suggest repeat 5 years   EGD 2020   - MILD Gastritis. Biopsied. - A single duodenal polyp. Resected and retrieved.  Past Medical History: SEE CHRONIC ISSSUES: Past Medical History:  Diagnosis Date   Anxiety and depression    Chronic constipation    Diabetes mellitus, type II (HCC)    Dr. Lenis managing   Frequent headaches chronic   ibuprofen helps some   GERD (gastroesophageal reflux disease)    History of UTI    Recurrent per pt (approx 3 per year)   Hyperlipidemia    Hypertension    Leukocytosis 12/2015   mild lymphocytosis.  Path smear review reassuring.  Repeat 01/2016 stable.   Morbid obesity (HCC)    OSA on CPAP    not using CPAP anymore   Past Surgical History:  Past Surgical History:  Procedure Laterality Date    ABDOMINAL HYSTERECTOMY  1998   Dysfunctional uterine bleeding.  No hx of abnormal paps.   BIOPSY  10/20/2018   Procedure: BIOPSY;  Surgeon: Harvey Margo CROME, MD;  [facility];  Service: Endoscopy;;  stomach   CATARACT EXTRACTION W/PHACO Left 08/24/2022   Procedure: CATARACT EXTRACTION PHACO AND INTRAOCULAR LENS PLACEMENT (IOC);  Surgeon: Harrie Agent, MD;  Location: [facility];  Service: Ophthalmology;  Laterality: Left;  CDE 8.53   CATARACT EXTRACTION W/PHACO Right 09/07/2022   Procedure: CATARACT EXTRACTION PHACO AND INTRAOCULAR LENS PLACEMENT (IOC);  Surgeon: Harrie Agent, MD;  Location: [facility];  Service: Ophthalmology;  Laterality: Right;  CDE: 10.02   COLONOSCOPY  2012   Dr. Gaylia: 1.5 cm pendunculated descending colon polyp (tubulovillous adenoma) and diminutive hyperplastic polyps   COLONOSCOPY N/A 10/20/2018   Procedure: COLONOSCOPY;  Surgeon: Harvey Margo CROME, MD; 4 tubular adenomas, diverticulosis in the sigmoid colon, tortuous left colon, external and internal hemorrhoids. Due for repeat in 2023.    COLONOSCOPY WITH PROPOFOL  N/A 12/24/2022   Procedure: COLONOSCOPY WITH PROPOFOL ;  Surgeon: Cindie Carlin POUR, DO;  [facility];  Service: Endoscopy;  Laterality: N/A;  11:15 am, asa 3   ESOPHAGOGASTRODUODENOSCOPY N/A 10/20/2018   Procedure: ESOPHAGOGASTRODUODENOSCOPY (EGD);  Surgeon: Harvey Margo CROME, MD;  gastritis and duodenitis. No H. Pylori.  POLYPECTOMY  10/20/2018   Procedure: POLYPECTOMY;  Surgeon: Harvey Margo CROME, MD;  [facility];  Service: Endoscopy;;   POLYPECTOMY  12/24/2022   Procedure: POLYPECTOMY INTESTINAL;  Surgeon: Cindie Carlin POUR, DO;  [facility];  Service: Endoscopy;;   wart removal  08/04/2019   benign   Family History:  Family History  Problem Relation Age of Onset   Cancer Mother        ovarian   COPD Father    Diabetes Father    Hyperlipidemia Father    Heart attack Father    Heart failure Father    Colon cancer Neg  Hx    Social History:  Social History   Tobacco Use   Smoking status: Every Day    Current packs/day: 0.25    Average packs/day: 0.3 packs/day for 20.0 years (5.0 ttl pk-yrs)    Types: Cigarettes   Smokeless tobacco: Never  Vaping Use   Vaping status: Never Used  Substance Use Topics   Alcohol use: No   Drug use: No    Home Medications:  Prior to Admission medications   Medication Sig Start Date End Date Taking? Authorizing Provider  atorvastatin  (LIPITOR) 20 MG tablet TAKE 1 Tablet BY MOUTH ONCE EVERY DAY 10/05/21   Comer Kirsch, PA-C  cloNIDine  (CATAPRES ) 0.1 MG tablet Take 1 tablet (0.1 mg total) by mouth 2 (two) times daily. 10/05/21   Comer Kirsch, PA-C  Dulaglutide (TRULICITY) 0.75 MG/0.5ML SOPN Inject 0.75 mg into the skin every Wednesday.    [provider]  glipiZIDE  (GLUCOTROL  XL) 5 MG 24 hr tablet Take 1 tablet (5 mg total) by mouth daily with breakfast. 10/18/22   Nida, Ethelle ORN, MD  glipiZIDE  (GLUCOTROL ) 5 MG tablet TAKE 1 TABLET BY MOUTH ONCE DAILY BEFORE BREAKFAST 01/04/23   Nida, Gebreselassie W, MD  linaclotide  (LINZESS ) 72 MCG capsule Take 72 mcg by mouth daily as needed (constipation).    [provider]  losartan -hydrochlorothiazide  (HYZAAR) 100-12.5 MG tablet Take 1 tablet by mouth daily. 11/09/22   [provider]  metFORMIN  (GLUCOPHAGE ) 1000 MG tablet Take 1 tablet (1,000 mg total) by mouth 2 (two) times daily with a meal. 10/05/21   Comer Kirsch, PA-C  metoprolol  tartrate (LOPRESSOR ) 100 MG tablet Take 1 tablet (100 mg total) by mouth 2 (two) times daily. Patient taking differently: Take 100 mg by mouth daily. 10/05/21   Comer Kirsch, PA-C    Inpatient Medications:  Current Facility-Administered Medications:    0.9 %  sodium chloride  infusion (Manually program via Guardrails IV Fluids), , Intravenous, Once, Tat, David, MD   acetaminophen  (TYLENOL ) tablet 650 mg, 650 mg, Oral, Q6H PRN **OR** acetaminophen  (TYLENOL )  suppository 650 mg, 650 mg, Rectal, Q6H PRN, Tat, David, MD   atorvastatin  (LIPITOR) tablet 20 mg, 20 mg, Oral, Daily, Tat, David, MD   Chlorhexidine Gluconate Cloth 2 % PADS 6 each, 6 each, Topical, Q0600, Tat, Alm, MD, 6 each at 04/04/24 0902   ondansetron  (ZOFRAN ) tablet 4 mg, 4 mg, Oral, Q6H PRN **OR** ondansetron  (ZOFRAN ) injection 4 mg, 4 mg, Intravenous, Q6H PRN, Tat, David, MD   pantoprazole  (PROTONIX ) injection 40 mg, 40 mg, Intravenous, Q12H, Tat, David, MD Allergies: Januvia  [sitagliptin ] and Amlodipine   Complete Review of Systems: GENERAL: negative for malaise, night sweats HEENT: No changes in hearing or vision, no nose bleeds or other nasal problems. NECK: Negative for lumps, goiter, pain and significant neck swelling RESPIRATORY: Negative for cough, wheezing CARDIOVASCULAR: Negative for chest pain,  leg swelling, palpitations, orthopnea GI: SEE HPI MUSCULOSKELETAL: Negative for joint pain or swelling, back pain, and muscle pain. SKIN: Negative for lesions, rash PSYCH: Negative for sleep disturbance, mood disorder and recent psychosocial stressors. HEMATOLOGY Negative for prolonged bleeding, bruising easily, and swollen nodes. ENDOCRINE: Negative for cold or heat intolerance, polyuria, polydipsia and goiter. NEURO: negative for tremor, gait imbalance, syncope and seizures. The remainder of the review of systems is noncontributory.  Physical Exam: BP (!) 109/50 (BP Location: Right Wrist)   Pulse 85   Temp (!) 97.5 F (36.4 C) (Oral)   Resp 20   Ht 5' 5 (1.651 m)   Wt 90.3 kg   SpO2 100%   BMI 33.13 kg/m  GENERAL: The patient is AO x3, in no acute distress. HEENT: Head is normocephalic and atraumatic. EOMI are intact. Mouth is well hydrated and without lesions. NECK: Supple. No masses LUNGS: Clear to auscultation. No presence of rhonchi/wheezing/rales. Adequate chest expansion HEART: RRR, normal s1 and s2. ABDOMEN: Soft, nontender, no guarding, no peritoneal signs,  and nondistended. BS +. No masses.   Laboratory Data CBC:     Component Value Date/Time   WBC 15.0 (H) 04/04/2024 0621   RBC 2.62 (L) 04/04/2024 0621   HGB 7.3 (L) 04/04/2024 0621   HCT 23.4 (L) 04/04/2024 0621   PLT 272 04/04/2024 0621   MCV 89.3 04/04/2024 0621   MCH 27.9 04/04/2024 0621   MCHC 31.2 04/04/2024 0621   RDW 14.5 04/04/2024 0621   LYMPHSABS 1.8 10/13/2020 1936   MONOABS 0.3 10/13/2020 1936   EOSABS 0.0 10/13/2020 1936   BASOSABS 0.1 10/13/2020 1936   COAG:  Lab Results  Component Value Date   INR 1.0 04/04/2024   INR 0.9 08/17/2019    BMP:     Latest Ref Rng & Units 04/04/2024    6:21 AM 11/29/2022   11:46 AM 10/08/2022    1:20 PM  BMP  Glucose 70 - 99 mg/dL 802  789  899   BUN 8 - 23 mg/dL 44  11  21   Creatinine 0.44 - 1.00 mg/dL 8.90  9.18  9.02   Sodium 135 - 145 mmol/L 140  137  139   Potassium 3.5 - 5.1 mmol/L 4.4  3.3  3.7   Chloride 98 - 111 mmol/L 104  101  105   CO2 22 - 32 mmol/L 23  26  24    Calcium  8.9 - 10.3 mg/dL 8.4  9.0  9.3     HEPATIC:     Latest Ref Rng & Units 04/04/2024    6:21 AM 10/08/2022    1:20 PM 03/08/2022   12:30 PM  Hepatic Function  Total Protein 6.5 - 8.1 g/dL 6.7  7.7  7.5   Albumin 3.5 - 5.0 g/dL 3.8  3.6  3.4   AST 15 - 41 U/L 22  22  24    ALT 0 - 44 U/L 13  15  18    Alk Phosphatase 38 - 126 U/L 68  81  81   Total Bilirubin 0.0 - 1.2 mg/dL 0.2  0.6  0.6     CARDIAC: No results found for: CKTOTAL, CKMB, CKMBINDEX, TROPONINI   Imaging: I personally reviewed and interpreted the available imaging.  Assessment & Plan:  This is a 62 year old female with hypertension, diabetes, GERD, IBS-C presented with hematochezia and melena. GI is consulted for acute posthemorrhagic anemia with melena concerning for upper GI bleed   #MELENA #Acute post hemorrhagic anemia Evidence  of elevated BUN/creatinine ratio >3 and drop in hemoglobin this is concerning for upper GI bleed which could be due to peptic ulcer  disease, angioectasia, Dieulafoy lesion   Although fortunately BUN/creatinine ratio has normalized and no further overt bleeding reported in the hospital   Plan for upper endoscopy IV PPI twice daily Transfuse to keep hemoglobin more than 7 CBC Q12   Duncan Alejandro [name] Bethanee Redondo, MD Gastroenterology and Hepatology [facility]  This chart has been completed using [employer] Dictation software, and while attempts have been made to ensure accuracy , certain words and phrases may not be transcribed as intended

## 2024-04-04 NOTE — Hospital Course (Signed)
 63 year old female with a history of hypertension, diabetes mellitus type 2, hyperlipidemia, GERD, IBS-C, and anxiety presenting with hematochezia and dark stools.  Patient stated that she first noticed some hematochezia on 04/01/2024.  Subsequently on the evening of 04/03/2024 she noted some dark and black stools mixed in with hematochezia.  She began feeling lightheaded and dizzy.  She felt like she had a fluttering sensation in her chest.  She denied any visual loss, focal extremity weakness.  She denies chest pain, shortness breath, nausea, vomiting.  She has had loose stools.  She denies any abdominal pain.  There is no dysuria or hematuria.  She does not take any blood thinners.  She denies any NSAIDs.  In the ED, the patient was afebrile hemodynamically stable with oxygen saturation 98% on room air.  WBC 15.0, hemoglobin 7.3, platelet 272.  Sodium 140, potassium 4.4, bicarbonate 23, serum creatinine 1.09.  LFTs unremarkable.  Lactic acid was 2.9.  Troponin 16.  EKG showed sinus rhythm, nonspecific ST changes.  GI was consulted assist with management.  Patient underwent EGD on 04/05/2024 with results discussed below.  She was continued on pantoprazole  twice daily.  She has been transfused 4 units PRBC total for the admission.  At the fair to the medical floor, the patient had another episode of rectal bleeding.  Hemoglobin dropped back down from 8.1-5.6.  She was moved back down to the stepdown unit.  GI was reconsulted.

## 2024-04-04 NOTE — ED Provider Notes (Signed)
 [facility] Emergency Department Provider Note MRN:  [id number]  Arrival date & time: 04/04/24     Chief Complaint   Palpitations, Weakness, and Blood In Stools   History of Present Illness   Casey Johnston is a 63 y.o. year-old female with a history of diabetes presenting to the ED with chief complaint of blood in stool.  Change in stool color over the past week.  Noticed bright red blood followed by black stool.  This evening feeling generally unwell with palpitations, low energy, weak.  Review of Systems  A thorough review of systems was obtained and all systems are negative except as noted in the HPI and PMH.   Patient's Health History    Past Medical History:  Diagnosis Date   Anxiety and depression    Chronic constipation    Diabetes mellitus, type II (HCC)    Dr. Lenis managing   Frequent headaches chronic   ibuprofen helps some   GERD (gastroesophageal reflux disease)    History of UTI    Recurrent per pt (approx 3 per year)   Hyperlipidemia    Hypertension    Leukocytosis 12/2015   mild lymphocytosis.  Path smear review reassuring.  Repeat 01/2016 stable.   Morbid obesity (HCC)    OSA on CPAP    not using CPAP anymore    Past Surgical History:  Procedure Laterality Date   ABDOMINAL HYSTERECTOMY  1998   Dysfunctional uterine bleeding.  No hx of abnormal paps.   BIOPSY  10/20/2018   Procedure: BIOPSY;  Surgeon: Harvey Margo CROME, MD;  [facility];  Service: Endoscopy;;  stomach   CATARACT EXTRACTION W/PHACO Left 08/24/2022   Procedure: CATARACT EXTRACTION PHACO AND INTRAOCULAR LENS PLACEMENT (IOC);  Surgeon: Harrie Agent, MD;  Location: [facility];  Service: Ophthalmology;  Laterality: Left;  CDE 8.53   CATARACT EXTRACTION W/PHACO Right 09/07/2022   Procedure: CATARACT EXTRACTION PHACO AND INTRAOCULAR LENS PLACEMENT (IOC);  Surgeon: Harrie Agent, MD;  Location: [facility];  Service: Ophthalmology;  Laterality: Right;  CDE: 10.02    COLONOSCOPY  2012   Dr. Gaylia: 1.5 cm pendunculated descending colon polyp (tubulovillous adenoma) and diminutive hyperplastic polyps   COLONOSCOPY N/A 10/20/2018   Procedure: COLONOSCOPY;  Surgeon: Harvey Margo CROME, MD; 4 tubular adenomas, diverticulosis in the sigmoid colon, tortuous left colon, external and internal hemorrhoids. Due for repeat in 2023.    COLONOSCOPY WITH PROPOFOL  N/A 12/24/2022   Procedure: COLONOSCOPY WITH PROPOFOL ;  Surgeon: Cindie Carlin POUR, DO;  [facility];  Service: Endoscopy;  Laterality: N/A;  11:15 am, asa 3   ESOPHAGOGASTRODUODENOSCOPY N/A 10/20/2018   Procedure: ESOPHAGOGASTRODUODENOSCOPY (EGD);  Surgeon: Harvey Margo CROME, MD;  gastritis and duodenitis. No H. Pylori.    POLYPECTOMY  10/20/2018   Procedure: POLYPECTOMY;  Surgeon: Harvey Margo CROME, MD;  [facility];  Service: Endoscopy;;   POLYPECTOMY  12/24/2022   Procedure: POLYPECTOMY INTESTINAL;  Surgeon: Cindie Carlin POUR, DO;  [facility];  Service: Endoscopy;;   wart removal  08/04/2019   benign    Family History  Problem Relation Age of Onset   Cancer Mother        ovarian   COPD Father    Diabetes Father    Hyperlipidemia Father    Heart attack Father    Heart failure Father    Colon cancer Neg Hx     Social History   Socioeconomic History   Marital status: Divorced  Spouse name: Not on file   Number of children: Not on file   Years of education: Not on file   Highest education level: Not on file  Occupational History   Not on file  Tobacco Use   Smoking status: Every Day    Current packs/day: 0.25    Average packs/day: 0.3 packs/day for 20.0 years (5.0 ttl pk-yrs)    Types: Cigarettes   Smokeless tobacco: Never  Vaping Use   Vaping status: Never Used  Substance and Sexual Activity   Alcohol use: No   Drug use: No   Sexual activity: Not on file  Other Topics Concern   Not on file  Social History Narrative   Separated x 20 yrs, one daughter.  She lives  with her.  Great niece and nephew live with her as well.   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   Tob: 7 pack year hx, quit 2010.   Alc: none   Exercise: none   Social Drivers of Corporate Investment Banker Strain: Low Risk (03/26/2023)   Received from [facility]   Overall Financial Resource Strain (CARDIA)    Difficulty of Paying Living Expenses: Not hard at all  Food Insecurity: No Food Insecurity (03/26/2023)   Received from [facility]   Hunger Vital Sign    Within the past 12 months, you worried that your food would run out before you got the money to buy more.: Never true    Within the past 12 months, the food you bought just didn't last and you didn't have money to get more.: Never true  Transportation Needs: No Transportation Needs (03/26/2023)   Received from [facility] - Transportation    Lack of Transportation (Medical): No    Lack of Transportation (Non-Medical): No  Physical Activity: Sufficiently Active (03/26/2023)   Received from [facility]   Exercise Vital Sign    On average, how many days per week do you engage in moderate to strenuous exercise (like a brisk walk)?: 6 days    On average, how many minutes do you engage in exercise at this level?: 30 min  Stress: Stress Concern Present (03/26/2023)   Received from [facility] - Occupational Stress Questionnaire    Feeling of Stress : To some extent  Social Connections: Moderately Isolated (03/26/2023)   Received from [facility]   Social Connection and Isolation Panel    In a typical week, how many times do you talk on the phone with family, friends, or neighbors?: Once a week    How often do you get together with friends or relatives?: Three times a week    How often do you attend church or religious services?: More than 4 times per year    Do you belong to any clubs or organizations such as church groups, unions, fraternal or athletic  groups, or school groups?: No    How often do you attend meetings of the clubs or organizations you belong to?: Never    Are you married, widowed, divorced, separated, never married, or living with a partner?: Divorced  Intimate Partner Violence: Not At Risk (03/26/2023)   Received from [facility]   Humiliation, Afraid, Rape, and Kick questionnaire    Within the last year, have you been afraid of your partner or ex-partner?: No    Within the last year, have you been humiliated or emotionally abused in  other ways by your partner or ex-partner?: No    Within the last year, have you been kicked, hit, slapped, or otherwise physically hurt by your partner or ex-partner?: No    Within the last year, have you been raped or forced to have any kind of sexual activity by your partner or ex-partner?: No     Physical Exam   Vitals:   04/04/24 0604 04/04/24 0635  BP: 98/68   Pulse: (!) 106   Resp: 20   Temp: 98.3 F (36.8 C)   SpO2: 98% 98%    CONSTITUTIONAL: Well-appearing, NAD NEURO/PSYCH:  Alert and oriented x 3, no focal deficits EYES:  eyes equal and reactive ENT/NECK:  no LAD, no JVD CARDIO: Tachycardic rate, well-perfused, normal S1 and S2 PULM:  CTAB no wheezing or rhonchi GI/GU:  non-distended, non-tender MSK/SPINE:  No gross deformities, no edema SKIN:  no rash, atraumatic   *Additional and/or pertinent findings included in MDM below  Diagnostic and Interventional Summary    EKG Interpretation Date/Time:  Saturday April 04 2024 06:06:21 EST Ventricular Rate:  107 PR Interval:    QRS Duration:  93 QT Interval:  360 QTC Calculation: 481 R Axis:   34  Text Interpretation: Atrial flutter with predominant 3:1 AV block Abnormal R-wave progression, early transition Repol abnrm, severe global ischemia (LM/MVD) Confirmed by Theadore Sharper [id number]) on 04/04/2024 6:47:03 AM       Labs Reviewed  CBC - Abnormal; Notable for the following components:      Result Value   WBC  15.0 (*)    RBC 2.62 (*)    Hemoglobin 7.3 (*)    HCT 23.4 (*)    All other components within normal limits  CBG MONITORING, ED - Abnormal; Notable for the following components:   Glucose-Capillary 196 (*)    All other components within normal limits  COMPREHENSIVE METABOLIC PANEL WITH GFR  URINALYSIS, ROUTINE W REFLEX MICROSCOPIC  PROTIME-INR  LACTIC ACID, PLASMA  LACTIC ACID, PLASMA  TYPE AND SCREEN  TROPONIN T, HIGH SENSITIVITY    No orders to display    Medications  sodium chloride  0.9 % bolus 1,000 mL (1,000 mLs Intravenous New Bag/Given 04/04/24 9362)     Procedures  /  Critical Care .Critical Care  Performed by: Theadore Sharper HERO, MD Authorized by: Theadore Sharper HERO, MD   Critical care provider statement:    Critical care time (minutes):  35   Critical care was necessary to treat or prevent imminent or life-threatening deterioration of the following conditions: Symptomatic anemia.   Critical care was time spent personally by me on the following activities:  Development of treatment plan with patient or surrogate, discussions with consultants, evaluation of patient's response to treatment, examination of patient, ordering and review of laboratory studies, ordering and review of radiographic studies, ordering and performing treatments and interventions, pulse oximetry, re-evaluation of patient's condition and review of old charts   ED Course and Medical Decision Making  Initial Impression and Ddx Concern for symptomatic anemia.  Arrives tachycardic with soft blood pressure, feeling generally unwell with palpitations.  EKG showing evidence of diffuse ischemia.  Not having any chest pain, no abdominal pain either.  Suspect upper GI bleed.  Not on blood thinners.  Past medical/surgical history that increases complexity of ED encounter: Diabetes  Interpretation of Diagnostics I personally reviewed the EKG and my interpretation is as follows: Sinus rhythm with diffuse ST  depressions  Labs reveal hemoglobin of 7.3  Patient Reassessment and  Ultimate Disposition/Management     Will discuss with patient blood transfusion, patient will need admission to the hospital.  Patient management required discussion with the following services or consulting groups:  Hospitalist Service  Complexity of Problems Addressed Acute illness or injury that poses threat of life of bodily function  Additional Data Reviewed and Analyzed Further history obtained from: Further history from spouse/family member  Additional Factors Impacting ED Encounter Risk Consideration of hospitalization  Ozell HERO. Theadore, MD [facility] Emergency Medicine [facility] [email or web address]  Final Clinical Impressions(s) / ED Diagnoses     ICD-10-CM   1. Symptomatic anemia  D64.9       ED Discharge Orders     None        Discharge Instructions Discussed with and Provided to Patient:   Discharge Instructions   None      Theadore Ozell HERO, MD 04/04/24 [phone]

## 2024-04-04 NOTE — ED Notes (Signed)
 Upon head to toe assessment and completing the GI assessment the pt endorses that her BM all diarrhea started on Monday with a green color and then Wednesday became bright red and yesterday with dark red.

## 2024-04-04 NOTE — ED Notes (Signed)
 Pt was made aware that we need a urine sample. Pt endorses that she is not able to give a sample at this time.

## 2024-04-04 NOTE — ED Triage Notes (Signed)
 Pt with c/o feeling rally bad. States feels like heart is fluttering, feels weak like she is going to pass out, and reports blood in stools since Wednesday. States blood in her stools started out bright red and has since changed to black.

## 2024-04-04 NOTE — ED Notes (Signed)
 Pt attempted to urinate but was not able to give us  a sample at this time.

## 2024-04-04 NOTE — H&P (Addendum)
 History and Physical    Patient: Casey Johnston FMW:[id number] DOB: Oct 31, 1960 DOA: 04/04/2024 DOS: the patient was seen and examined on 04/04/2024 PCP: Teresa Jenkins Jansky, FNP  Patient coming from: Home  Chief Complaint:  Chief Complaint  Patient presents with   Palpitations   Weakness   Blood In Stools   HPI: Casey Johnston is a 63 year old female with a history of hypertension, diabetes mellitus type 2, hyperlipidemia, GERD, IBS-C, and anxiety presenting with hematochezia and dark stools.  Patient stated that she first noticed some hematochezia on 04/01/2024.  Subsequently on the evening of 04/03/2024 she noted some dark and black stools mixed in with hematochezia.  She began feeling lightheaded and dizzy.  She felt like she had a fluttering sensation in her chest.  She denied any visual loss, focal extremity weakness.  She denies chest pain, shortness breath, nausea, vomiting.  She has had loose stools.  She denies any abdominal pain.  There is no dysuria or hematuria.  She does not take any blood thinners.  She denies any NSAIDs.  In the ED, the patient was afebrile hemodynamically stable with oxygen saturation 98% on room air.  WBC 15.0, hemoglobin 7.3, platelet 272.  Sodium 140, potassium 4.4, bicarbonate 23, serum creatinine 1.09.  LFTs unremarkable.  Lactic acid was 2.9.  Troponin 16.  EKG showed sinus rhythm, nonspecific ST changes.  GI was consulted assist with management.  Review of Systems: As mentioned in the history of present illness. All other systems reviewed and are negative. Past Medical History:  Diagnosis Date   Anxiety and depression    Chronic constipation    Diabetes mellitus, type II (HCC)    Dr. Lenis managing   Frequent headaches chronic   ibuprofen helps some   GERD (gastroesophageal reflux disease)    History of UTI    Recurrent per pt (approx 3 per year)   Hyperlipidemia    Hypertension    Leukocytosis 12/2015   mild lymphocytosis.  Path smear  review reassuring.  Repeat 01/2016 stable.   Morbid obesity (HCC)    OSA on CPAP    not using CPAP anymore   Past Surgical History:  Procedure Laterality Date   ABDOMINAL HYSTERECTOMY  1998   Dysfunctional uterine bleeding.  No hx of abnormal paps.   BIOPSY  10/20/2018   Procedure: BIOPSY;  Surgeon: Harvey Margo CROME, MD;  [facility];  Service: Endoscopy;;  stomach   CATARACT EXTRACTION W/PHACO Left 08/24/2022   Procedure: CATARACT EXTRACTION PHACO AND INTRAOCULAR LENS PLACEMENT (IOC);  Surgeon: Harrie Agent, MD;  Location: [facility];  Service: Ophthalmology;  Laterality: Left;  CDE 8.53   CATARACT EXTRACTION W/PHACO Right 09/07/2022   Procedure: CATARACT EXTRACTION PHACO AND INTRAOCULAR LENS PLACEMENT (IOC);  Surgeon: Harrie Agent, MD;  Location: [facility];  Service: Ophthalmology;  Laterality: Right;  CDE: 10.02   COLONOSCOPY  2012   Dr. Gaylia: 1.5 cm pendunculated descending colon polyp (tubulovillous adenoma) and diminutive hyperplastic polyps   COLONOSCOPY N/A 10/20/2018   Procedure: COLONOSCOPY;  Surgeon: Harvey Margo CROME, MD; 4 tubular adenomas, diverticulosis in the sigmoid colon, tortuous left colon, external and internal hemorrhoids. Due for repeat in 2023.    COLONOSCOPY WITH PROPOFOL  N/A 12/24/2022   Procedure: COLONOSCOPY WITH PROPOFOL ;  Surgeon: Cindie Carlin POUR, DO;  [facility]: [facility];  Service: Endoscopy;  Laterality: N/A;  11:15 am, asa 3   ESOPHAGOGASTRODUODENOSCOPY N/A 10/20/2018   Procedure: ESOPHAGOGASTRODUODENOSCOPY (EGD);  Surgeon: Harvey Margo CROME, MD;  gastritis and duodenitis. No H. Pylori.    POLYPECTOMY  10/20/2018   Procedure: POLYPECTOMY;  Surgeon: Harvey Margo CROME, MD;  [facility];  Service: Endoscopy;;   POLYPECTOMY  12/24/2022   Procedure: POLYPECTOMY INTESTINAL;  Surgeon: Cindie Carlin POUR, DO;  [facility];  Service: Endoscopy;;   wart removal  08/04/2019   benign   Social History:  reports that she has been smoking cigarettes.  She has a 5 pack-year smoking history. She has never used smokeless tobacco. She reports that she does not drink alcohol and does not use drugs.  Allergies  Allergen Reactions   Amlodipine  Swelling   Januvia  [Sitagliptin ] Other (See Comments)    Caused pancreatitis    Family History  Problem Relation Age of Onset   Cancer Mother        ovarian   COPD Father    Diabetes Father    Hyperlipidemia Father    Heart attack Father    Heart failure Father    Colon cancer Neg Hx     Prior to Admission medications   Medication Sig Start Date End Date Taking? Authorizing Provider  atorvastatin  (LIPITOR) 20 MG tablet TAKE 1 Tablet BY MOUTH ONCE EVERY DAY 10/05/21   Comer Kirsch, PA-C  cloNIDine  (CATAPRES ) 0.1 MG tablet Take 1 tablet (0.1 mg total) by mouth 2 (two) times daily. 10/05/21   Comer Kirsch, PA-C  Dulaglutide (TRULICITY) 0.75 MG/0.5ML SOPN Inject 0.75 mg into the skin every Wednesday.    [provider]  glipiZIDE  (GLUCOTROL  XL) 5 MG 24 hr tablet Take 1 tablet (5 mg total) by mouth daily with breakfast. 10/18/22   Nida, Ethelle ORN, MD  glipiZIDE  (GLUCOTROL ) 5 MG tablet TAKE 1 TABLET BY MOUTH ONCE DAILY BEFORE BREAKFAST 01/04/23   Nida, Gebreselassie W, MD  linaclotide  (LINZESS ) 72 MCG capsule Take 72 mcg by mouth daily as needed (constipation).    [provider]  losartan -hydrochlorothiazide  (HYZAAR) 100-12.5 MG tablet Take 1 tablet by mouth daily. 11/09/22   [provider]  metFORMIN  (GLUCOPHAGE ) 1000 MG tablet Take 1 tablet (1,000 mg total) by mouth 2 (two) times daily with a meal. 10/05/21   Comer Kirsch, PA-C  metoprolol  tartrate (LOPRESSOR ) 100 MG tablet Take 1 tablet (100 mg total) by mouth 2 (two) times daily. Patient taking differently: Take 100 mg by mouth daily. 10/05/21   Comer Kirsch, PA-C    Physical Exam: Vitals:   04/04/24 0604 04/04/24 0635  BP: 98/68   Pulse: (!) 106   Resp: 20   Temp: 98.3 F (36.8 C)   TempSrc: Oral    SpO2: 98% 98%  Weight: 88 kg   Height: 5' 5 (1.651 m)    GENERAL:  A&O x 3, NAD, well developed, cooperative, follows commands HEENT: [place]/AT, No thrush, No icterus, No oral ulcers Neck:  No neck mass, No meningismus, soft, supple CV: RRR, no S3, no S4, no rub, no JVD Lungs:  CTA, no wheeze, no rhonchi, good air movement Abd: soft/NT +BS, nondistended Ext: No edema, no lymphangitis, no cyanosis, no rashes Neuro:  CN II-XII intact, strength 4/5 in RUE, RLE, strength 4/5 LUE, LLE; sensation intact bilateral; no dysmetria; babinski equivocal  Data Reviewed: Data reviewed above in history  Assessment and Plan: Acute blood loss anemia/symptomatic anemia -Presented with hemoglobin 7.3 - Previous baseline hemoglobin~12 - GI consult - Transfuse 2 unit PRBC - clear liquid diet - Pantoprazole  twice daily  Essential hypertension - BP is soft - Hold clonidine  -  Concerned with ongoing blood loss, admit to stepdown unit - She is no longer taking metoprolol   Diabetes mellitus type 2 - NovoLog  sliding scale - Holding metformin  and glipizide  - She takes Trulicity on Mondays  Mixed hyperlipidemia - Continue statin  Lactic acidosis - Likely secondary to volume depletion from blood loss - Blood cultures x 2 sets - UA - Transfusing PRBC - 1 L crystalloid given    Advance Care Planning: FULL  Consults: GI  Family Communication: none present  Severity of Illness: The appropriate patient status for this patient is OBSERVATION. Observation status is judged to be reasonable and necessary in order to provide the required intensity of service to ensure the patient's safety. The patient's presenting symptoms, physical exam findings, and initial radiographic and laboratory data in the context of their medical condition is felt to place them at decreased risk for further clinical deterioration. Furthermore, it is anticipated that the patient will be medically stable for discharge from the  hospital within 2 midnights of admission.   Author: Alm Schneider, MD 04/04/2024 7:52 AM  For on call review [email or web address].

## 2024-04-04 NOTE — H&P (View-Only) (Signed)
 Tamasha Laplante [name] Sharnita Bogucki, M.D. Gastroenterology & Hepatology                                           Patient Name: Casey Johnston Account #: @FLAACCTNO @   MRN: [id number] Admission Date: 04/04/2024 Date of Evaluation:  04/04/2024 Time of Evaluation: 1:58 PM  Chief Complaint:  Anemia, black color stool  HPI:  This is a 63 year old female with hypertension, diabetes, GERD, IBS-C presented with hematochezia and melena. GI is consulted for acute posthemorrhagic anemia with melena concerning for upper GI bleed    patient started noticed black-colored stools for past 3 days sometimes black in color and dark-colored stools.Denies any NSAIDs. Denies any abdominal pain , nausea , vomiting   Initially with elevated BUN/creatinine of 44/1.09 Hemoglobin 7.3 with baseline of 12 Leukocytosis of 15   Previous imaging demonstrating descending colon diverticulosis   Patient is up-to-date to colonoscopy with Dr. Cindie 12/2023 - Non- bleeding internal hemorrhoids. - Diverticulosis in the sigmoid colon. - One 4 mm polyp in the sigmoid colon, removed with a cold snare. Resected and retrieved. - Stool in the entire examined colon. - The examination was otherwise normal. Suggest repeat 5 years   EGD 2020   - MILD Gastritis. Biopsied. - A single duodenal polyp. Resected and retrieved.  Past Medical History: SEE CHRONIC ISSSUES: Past Medical History:  Diagnosis Date   Anxiety and depression    Chronic constipation    Diabetes mellitus, type II (HCC)    Dr. Lenis managing   Frequent headaches chronic   ibuprofen helps some   GERD (gastroesophageal reflux disease)    History of UTI    Recurrent per pt (approx 3 per year)   Hyperlipidemia    Hypertension    Leukocytosis 12/2015   mild lymphocytosis.  Path smear review reassuring.  Repeat 01/2016 stable.   Morbid obesity (HCC)    OSA on CPAP    not using CPAP anymore   Past Surgical History:  Past Surgical History:  Procedure Laterality Date    ABDOMINAL HYSTERECTOMY  1998   Dysfunctional uterine bleeding.  No hx of abnormal paps.   BIOPSY  10/20/2018   Procedure: BIOPSY;  Surgeon: Harvey Margo CROME, MD;  [facility];  Service: Endoscopy;;  stomach   CATARACT EXTRACTION W/PHACO Left 08/24/2022   Procedure: CATARACT EXTRACTION PHACO AND INTRAOCULAR LENS PLACEMENT (IOC);  Surgeon: Harrie Agent, MD;  Location: [facility];  Service: Ophthalmology;  Laterality: Left;  CDE 8.53   CATARACT EXTRACTION W/PHACO Right 09/07/2022   Procedure: CATARACT EXTRACTION PHACO AND INTRAOCULAR LENS PLACEMENT (IOC);  Surgeon: Harrie Agent, MD;  Location: [facility];  Service: Ophthalmology;  Laterality: Right;  CDE: 10.02   COLONOSCOPY  2012   Dr. Gaylia: 1.5 cm pendunculated descending colon polyp (tubulovillous adenoma) and diminutive hyperplastic polyps   COLONOSCOPY N/A 10/20/2018   Procedure: COLONOSCOPY;  Surgeon: Harvey Margo CROME, MD; 4 tubular adenomas, diverticulosis in the sigmoid colon, tortuous left colon, external and internal hemorrhoids. Due for repeat in 2023.    COLONOSCOPY WITH PROPOFOL  N/A 12/24/2022   Procedure: COLONOSCOPY WITH PROPOFOL ;  Surgeon: Cindie Carlin POUR, DO;  [facility];  Service: Endoscopy;  Laterality: N/A;  11:15 am, asa 3   ESOPHAGOGASTRODUODENOSCOPY N/A 10/20/2018   Procedure: ESOPHAGOGASTRODUODENOSCOPY (EGD);  Surgeon: Harvey Margo CROME, MD;  gastritis and duodenitis. No H. Pylori.  POLYPECTOMY  10/20/2018   Procedure: POLYPECTOMY;  Surgeon: Harvey Margo CROME, MD;  [facility];  Service: Endoscopy;;   POLYPECTOMY  12/24/2022   Procedure: POLYPECTOMY INTESTINAL;  Surgeon: Cindie Carlin POUR, DO;  [facility];  Service: Endoscopy;;   wart removal  08/04/2019   benign   Family History:  Family History  Problem Relation Age of Onset   Cancer Mother        ovarian   COPD Father    Diabetes Father    Hyperlipidemia Father    Heart attack Father    Heart failure Father    Colon cancer Neg  Hx    Social History:  Social History   Tobacco Use   Smoking status: Every Day    Current packs/day: 0.25    Average packs/day: 0.3 packs/day for 20.0 years (5.0 ttl pk-yrs)    Types: Cigarettes   Smokeless tobacco: Never  Vaping Use   Vaping status: Never Used  Substance Use Topics   Alcohol use: No   Drug use: No    Home Medications:  Prior to Admission medications   Medication Sig Start Date End Date Taking? Authorizing Provider  atorvastatin  (LIPITOR) 20 MG tablet TAKE 1 Tablet BY MOUTH ONCE EVERY DAY 10/05/21   Comer Kirsch, PA-C  cloNIDine  (CATAPRES ) 0.1 MG tablet Take 1 tablet (0.1 mg total) by mouth 2 (two) times daily. 10/05/21   Comer Kirsch, PA-C  Dulaglutide (TRULICITY) 0.75 MG/0.5ML SOPN Inject 0.75 mg into the skin every Wednesday.    [provider]  glipiZIDE  (GLUCOTROL  XL) 5 MG 24 hr tablet Take 1 tablet (5 mg total) by mouth daily with breakfast. 10/18/22   Nida, Ethelle ORN, MD  glipiZIDE  (GLUCOTROL ) 5 MG tablet TAKE 1 TABLET BY MOUTH ONCE DAILY BEFORE BREAKFAST 01/04/23   Nida, Gebreselassie W, MD  linaclotide  (LINZESS ) 72 MCG capsule Take 72 mcg by mouth daily as needed (constipation).    [provider]  losartan -hydrochlorothiazide  (HYZAAR) 100-12.5 MG tablet Take 1 tablet by mouth daily. 11/09/22   [provider]  metFORMIN  (GLUCOPHAGE ) 1000 MG tablet Take 1 tablet (1,000 mg total) by mouth 2 (two) times daily with a meal. 10/05/21   Comer Kirsch, PA-C  metoprolol  tartrate (LOPRESSOR ) 100 MG tablet Take 1 tablet (100 mg total) by mouth 2 (two) times daily. Patient taking differently: Take 100 mg by mouth daily. 10/05/21   Comer Kirsch, PA-C    Inpatient Medications:  Current Facility-Administered Medications:    0.9 %  sodium chloride  infusion (Manually program via Guardrails IV Fluids), , Intravenous, Once, Tat, David, MD   acetaminophen  (TYLENOL ) tablet 650 mg, 650 mg, Oral, Q6H PRN **OR** acetaminophen  (TYLENOL )  suppository 650 mg, 650 mg, Rectal, Q6H PRN, Tat, David, MD   atorvastatin  (LIPITOR) tablet 20 mg, 20 mg, Oral, Daily, Tat, David, MD   Chlorhexidine Gluconate Cloth 2 % PADS 6 each, 6 each, Topical, Q0600, Tat, Alm, MD, 6 each at 04/04/24 0902   ondansetron  (ZOFRAN ) tablet 4 mg, 4 mg, Oral, Q6H PRN **OR** ondansetron  (ZOFRAN ) injection 4 mg, 4 mg, Intravenous, Q6H PRN, Tat, David, MD   pantoprazole  (PROTONIX ) injection 40 mg, 40 mg, Intravenous, Q12H, Tat, David, MD Allergies: Januvia  [sitagliptin ] and Amlodipine   Complete Review of Systems: GENERAL: negative for malaise, night sweats HEENT: No changes in hearing or vision, no nose bleeds or other nasal problems. NECK: Negative for lumps, goiter, pain and significant neck swelling RESPIRATORY: Negative for cough, wheezing CARDIOVASCULAR: Negative for chest pain,  leg swelling, palpitations, orthopnea GI: SEE HPI MUSCULOSKELETAL: Negative for joint pain or swelling, back pain, and muscle pain. SKIN: Negative for lesions, rash PSYCH: Negative for sleep disturbance, mood disorder and recent psychosocial stressors. HEMATOLOGY Negative for prolonged bleeding, bruising easily, and swollen nodes. ENDOCRINE: Negative for cold or heat intolerance, polyuria, polydipsia and goiter. NEURO: negative for tremor, gait imbalance, syncope and seizures. The remainder of the review of systems is noncontributory.  Physical Exam: BP (!) 109/50 (BP Location: Right Wrist)   Pulse 85   Temp (!) 97.5 F (36.4 C) (Oral)   Resp 20   Ht 5' 5 (1.651 m)   Wt 90.3 kg   SpO2 100%   BMI 33.13 kg/m  GENERAL: The patient is AO x3, in no acute distress. HEENT: Head is normocephalic and atraumatic. EOMI are intact. Mouth is well hydrated and without lesions. NECK: Supple. No masses LUNGS: Clear to auscultation. No presence of rhonchi/wheezing/rales. Adequate chest expansion HEART: RRR, normal s1 and s2. ABDOMEN: Soft, nontender, no guarding, no peritoneal signs,  and nondistended. BS +. No masses.   Laboratory Data CBC:     Component Value Date/Time   WBC 15.0 (H) 04/04/2024 0621   RBC 2.62 (L) 04/04/2024 0621   HGB 7.3 (L) 04/04/2024 0621   HCT 23.4 (L) 04/04/2024 0621   PLT 272 04/04/2024 0621   MCV 89.3 04/04/2024 0621   MCH 27.9 04/04/2024 0621   MCHC 31.2 04/04/2024 0621   RDW 14.5 04/04/2024 0621   LYMPHSABS 1.8 10/13/2020 1936   MONOABS 0.3 10/13/2020 1936   EOSABS 0.0 10/13/2020 1936   BASOSABS 0.1 10/13/2020 1936   COAG:  Lab Results  Component Value Date   INR 1.0 04/04/2024   INR 0.9 08/17/2019    BMP:     Latest Ref Rng & Units 04/04/2024    6:21 AM 11/29/2022   11:46 AM 10/08/2022    1:20 PM  BMP  Glucose 70 - 99 mg/dL 802  789  899   BUN 8 - 23 mg/dL 44  11  21   Creatinine 0.44 - 1.00 mg/dL 8.90  9.18  9.02   Sodium 135 - 145 mmol/L 140  137  139   Potassium 3.5 - 5.1 mmol/L 4.4  3.3  3.7   Chloride 98 - 111 mmol/L 104  101  105   CO2 22 - 32 mmol/L 23  26  24    Calcium  8.9 - 10.3 mg/dL 8.4  9.0  9.3     HEPATIC:     Latest Ref Rng & Units 04/04/2024    6:21 AM 10/08/2022    1:20 PM 03/08/2022   12:30 PM  Hepatic Function  Total Protein 6.5 - 8.1 g/dL 6.7  7.7  7.5   Albumin 3.5 - 5.0 g/dL 3.8  3.6  3.4   AST 15 - 41 U/L 22  22  24    ALT 0 - 44 U/L 13  15  18    Alk Phosphatase 38 - 126 U/L 68  81  81   Total Bilirubin 0.0 - 1.2 mg/dL 0.2  0.6  0.6     CARDIAC: No results found for: CKTOTAL, CKMB, CKMBINDEX, TROPONINI   Imaging: I personally reviewed and interpreted the available imaging.  Assessment & Plan:  This is a 62 year old female with hypertension, diabetes, GERD, IBS-C presented with hematochezia and melena. GI is consulted for acute posthemorrhagic anemia with melena concerning for upper GI bleed   #MELENA #Acute post hemorrhagic anemia Evidence  of elevated BUN/creatinine ratio >3 and drop in hemoglobin this is concerning for upper GI bleed which could be due to peptic ulcer  disease, angioectasia, Dieulafoy lesion   Although fortunately BUN/creatinine ratio has normalized and no further overt bleeding reported in the hospital   Plan for upper endoscopy IV PPI twice daily Transfuse to keep hemoglobin more than 7 CBC Q12   Casey Alejandro [name] Bethanee Redondo, MD Gastroenterology and Hepatology [facility]  This chart has been completed using [employer] Dictation software, and while attempts have been made to ensure accuracy , certain words and phrases may not be transcribed as intended

## 2024-04-04 NOTE — Progress Notes (Signed)
 Patient received a total of 2 units PRBCs. H&H to be drawn at 6pm. Patient with 3 large bowel movements, black/brown in color since admission to SD unit around 0900.

## 2024-04-05 ENCOUNTER — Encounter (HOSPITAL_COMMUNITY)
Admission: EM | Disposition: A | Discharge status: Home / Self Care | Payer: Self-pay | Source: Home / Self Care | Attending: Internal Medicine

## 2024-04-05 ENCOUNTER — Observation Stay (HOSPITAL_COMMUNITY): Admitting: Anesthesiology

## 2024-04-05 DIAGNOSIS — Z8249 Family history of ischemic heart disease and other diseases of the circulatory system: Secondary | ICD-10-CM | POA: Diagnosis not present

## 2024-04-05 DIAGNOSIS — E869 Volume depletion, unspecified: Secondary | ICD-10-CM | POA: Diagnosis present

## 2024-04-05 DIAGNOSIS — D123 Benign neoplasm of transverse colon: Secondary | ICD-10-CM | POA: Diagnosis not present

## 2024-04-05 DIAGNOSIS — D124 Benign neoplasm of descending colon: Secondary | ICD-10-CM | POA: Diagnosis not present

## 2024-04-05 DIAGNOSIS — K269 Duodenal ulcer, unspecified as acute or chronic, without hemorrhage or perforation: Secondary | ICD-10-CM | POA: Diagnosis not present

## 2024-04-05 DIAGNOSIS — E1165 Type 2 diabetes mellitus with hyperglycemia: Secondary | ICD-10-CM | POA: Diagnosis present

## 2024-04-05 DIAGNOSIS — E119 Type 2 diabetes mellitus without complications: Secondary | ICD-10-CM | POA: Diagnosis not present

## 2024-04-05 DIAGNOSIS — K921 Melena: Secondary | ICD-10-CM | POA: Diagnosis not present

## 2024-04-05 DIAGNOSIS — Z833 Family history of diabetes mellitus: Secondary | ICD-10-CM | POA: Diagnosis not present

## 2024-04-05 DIAGNOSIS — E782 Mixed hyperlipidemia: Secondary | ICD-10-CM | POA: Diagnosis present

## 2024-04-05 DIAGNOSIS — I10 Essential (primary) hypertension: Secondary | ICD-10-CM | POA: Diagnosis present

## 2024-04-05 DIAGNOSIS — Z6833 Body mass index (BMI) 33.0-33.9, adult: Secondary | ICD-10-CM | POA: Diagnosis not present

## 2024-04-05 DIAGNOSIS — E872 Acidosis, unspecified: Secondary | ICD-10-CM | POA: Diagnosis present

## 2024-04-05 DIAGNOSIS — D649 Anemia, unspecified: Secondary | ICD-10-CM | POA: Diagnosis not present

## 2024-04-05 DIAGNOSIS — Z7984 Long term (current) use of oral hypoglycemic drugs: Secondary | ICD-10-CM | POA: Diagnosis not present

## 2024-04-05 DIAGNOSIS — K5520 Angiodysplasia of colon without hemorrhage: Secondary | ICD-10-CM | POA: Diagnosis not present

## 2024-04-05 DIAGNOSIS — K31819 Angiodysplasia of stomach and duodenum without bleeding: Secondary | ICD-10-CM | POA: Diagnosis not present

## 2024-04-05 DIAGNOSIS — Z8349 Family history of other endocrine, nutritional and metabolic diseases: Secondary | ICD-10-CM | POA: Diagnosis not present

## 2024-04-05 DIAGNOSIS — K264 Chronic or unspecified duodenal ulcer with hemorrhage: Secondary | ICD-10-CM | POA: Diagnosis present

## 2024-04-05 DIAGNOSIS — K31811 Angiodysplasia of stomach and duodenum with bleeding: Secondary | ICD-10-CM | POA: Diagnosis present

## 2024-04-05 DIAGNOSIS — F17210 Nicotine dependence, cigarettes, uncomplicated: Secondary | ICD-10-CM | POA: Diagnosis present

## 2024-04-05 DIAGNOSIS — K219 Gastro-esophageal reflux disease without esophagitis: Secondary | ICD-10-CM | POA: Diagnosis present

## 2024-04-05 DIAGNOSIS — E669 Obesity, unspecified: Secondary | ICD-10-CM | POA: Diagnosis not present

## 2024-04-05 DIAGNOSIS — Z79899 Other long term (current) drug therapy: Secondary | ICD-10-CM | POA: Diagnosis not present

## 2024-04-05 DIAGNOSIS — D62 Acute posthemorrhagic anemia: Secondary | ICD-10-CM | POA: Diagnosis present

## 2024-04-05 DIAGNOSIS — K254 Chronic or unspecified gastric ulcer with hemorrhage: Secondary | ICD-10-CM | POA: Diagnosis present

## 2024-04-05 DIAGNOSIS — K5730 Diverticulosis of large intestine without perforation or abscess without bleeding: Secondary | ICD-10-CM | POA: Diagnosis present

## 2024-04-05 DIAGNOSIS — E66811 Obesity, class 1: Secondary | ICD-10-CM | POA: Diagnosis present

## 2024-04-05 DIAGNOSIS — R578 Other shock: Secondary | ICD-10-CM | POA: Diagnosis present

## 2024-04-05 DIAGNOSIS — Z888 Allergy status to other drugs, medicaments and biological substances status: Secondary | ICD-10-CM | POA: Diagnosis not present

## 2024-04-05 DIAGNOSIS — K648 Other hemorrhoids: Secondary | ICD-10-CM | POA: Diagnosis present

## 2024-04-05 DIAGNOSIS — K259 Gastric ulcer, unspecified as acute or chronic, without hemorrhage or perforation: Secondary | ICD-10-CM | POA: Diagnosis not present

## 2024-04-05 DIAGNOSIS — Z825 Family history of asthma and other chronic lower respiratory diseases: Secondary | ICD-10-CM | POA: Diagnosis not present

## 2024-04-05 DIAGNOSIS — G4733 Obstructive sleep apnea (adult) (pediatric): Secondary | ICD-10-CM | POA: Diagnosis present

## 2024-04-05 DIAGNOSIS — D509 Iron deficiency anemia, unspecified: Secondary | ICD-10-CM | POA: Diagnosis not present

## 2024-04-05 HISTORY — PX: ESOPHAGOGASTRODUODENOSCOPY: SHX5428

## 2024-04-05 LAB — MISC LABCORP TEST (SEND OUT): Labcorp test code: 83935

## 2024-04-05 LAB — CBC
HCT: 24.1 % — ABNORMAL LOW (ref 36.0–46.0)
Hemoglobin: 8.1 g/dL — ABNORMAL LOW (ref 12.0–15.0)
MCH: 29.2 pg (ref 26.0–34.0)
MCHC: 33.6 g/dL (ref 30.0–36.0)
MCV: 87 fL (ref 80.0–100.0)
Platelets: 140 K/uL — ABNORMAL LOW (ref 150–400)
RBC: 2.77 MIL/uL — ABNORMAL LOW (ref 3.87–5.11)
RDW: 13.9 % (ref 11.5–15.5)
WBC: 15.8 K/uL — ABNORMAL HIGH (ref 4.0–10.5)
nRBC: 0 % (ref 0.0–0.2)

## 2024-04-05 LAB — BASIC METABOLIC PANEL WITH GFR
Anion gap: 8 (ref 5–15)
BUN: 36 mg/dL — ABNORMAL HIGH (ref 8–23)
CO2: 23 mmol/L (ref 22–32)
Calcium: 7.6 mg/dL — ABNORMAL LOW (ref 8.9–10.3)
Chloride: 107 mmol/L (ref 98–111)
Creatinine, Ser: 0.93 mg/dL (ref 0.44–1.00)
GFR, Estimated: >60 mL/min (ref >60)
Glucose, Bld: 130 mg/dL — ABNORMAL HIGH (ref 70–99)
Potassium: 4.2 mmol/L (ref 3.5–5.1)
Sodium: 138 mmol/L (ref 135–145)

## 2024-04-05 LAB — HEMOGLOBIN AND HEMATOCRIT, BLOOD
HCT: 23.1 % — ABNORMAL LOW (ref 36.0–46.0)
Hemoglobin: 7.6 g/dL — ABNORMAL LOW (ref 12.0–15.0)

## 2024-04-05 LAB — MAGNESIUM: Magnesium: 1.6 mg/dL — ABNORMAL LOW (ref 1.7–2.4)

## 2024-04-05 MED ORDER — LACTATED RINGERS IV SOLN
INTRAVENOUS | Status: DC | PRN
  Administered 2024-04-07: 1000 mL via INTRAVENOUS

## 2024-04-05 MED ORDER — MAGNESIUM SULFATE 2 GM/50ML IV SOLN
2.0000 g | Freq: Once | INTRAVENOUS | Status: AC
  Administered 2024-04-05: 2 g via INTRAVENOUS
  Filled 2024-04-05: qty 50

## 2024-04-05 MED ORDER — PROPOFOL 500 MG/50ML IV EMUL
INTRAVENOUS | Status: AC
Start: 2024-04-05 — End: 2024-04-05
  Filled 2024-04-05: qty 100

## 2024-04-05 MED ORDER — PROPOFOL 10 MG/ML IV BOLUS
INTRAVENOUS | Status: DC | PRN
  Administered 2024-04-05: 200 mg via INTRAVENOUS

## 2024-04-05 NOTE — Progress Notes (Signed)
 PROGRESS NOTE  Casey Johnston FMW:[id number] DOB: [birth year] DOA: 04/04/2024 PCP: Teresa Jenkins Jansky, FNP  Brief History:  63 year old female with a history of hypertension, diabetes mellitus type 2, hyperlipidemia, GERD, IBS-C, and anxiety presenting with hematochezia and dark stools.  Patient stated that she first noticed some hematochezia on 04/01/2024.  Subsequently on the evening of 04/03/2024 she noted some dark and black stools mixed in with hematochezia.  She began feeling lightheaded and dizzy.  She felt like she had a fluttering sensation in her chest.  She denied any visual loss, focal extremity weakness.  She denies chest pain, shortness breath, nausea, vomiting.  She has had loose stools.  She denies any abdominal pain.  There is no dysuria or hematuria.  She does not take any blood thinners.  She denies any NSAIDs.  In the ED, the patient was afebrile hemodynamically stable with oxygen saturation 98% on room air.  WBC 15.0, hemoglobin 7.3, platelet 272.  Sodium 140, potassium 4.4, bicarbonate 23, serum creatinine 1.09.  LFTs unremarkable.  Lactic acid was 2.9.  Troponin 16.  EKG showed sinus rhythm, nonspecific ST changes.  GI was consulted assist with management.   Assessment/Plan: Acute blood loss anemia/symptomatic anemia -Presented with hemoglobin 7.3 - Previous baseline hemoglobin~12 - GI consult appreciated - Transfused 4 unit PRBC this admission - full liquid diet - Pantoprazole  twice daily - 11/16 EGD--4 angioectaasia w/o bleed antrum; 3 angioectasia with recent bleed duodenal bulb, 2 in duodenum---all treated with APC - repeat CBC in am   Essential hypertension - BP is soft - Hold clonidine  - Concerned with ongoing blood loss, admit to stepdown unit - She is no longer taking metoprolol    Diabetes mellitus type 2 - NovoLog  sliding scale - Holding metformin  and glipizide  - She takes Trulicity on Mondays - 11/15 A1C--6.8   Mixed hyperlipidemia -  Continue statin   Lactic acidosis - Likely secondary to volume depletion from blood loss - Blood cultures x 2 sets--neg to date - UA--no pyuria - Transfusing PRBC--4 units given this admit - 1 L crystalloid given      Family Communication:   son at bedside 11/16  Consultants:  GI  Code Status:  FULL   DVT Prophylaxis:  SCDs   Procedures: As Listed in Progress Note Above  Antibiotics: None        Subjective: Has some nausea.  Denies f/c, cp, sob, abd pain.  Had some melena last night, no hematochezia  Objective: Vitals:   04/05/24 0750 04/05/24 0800 04/05/24 1103 04/05/24 1115  BP:  (!) 156/56 (!) 77/38 (!) 134/53  Pulse:  82 92 87  Resp:  16 20 (!) 22  Temp: 98.7 F (37.1 C)  98.7 F (37.1 C)   TempSrc: Oral     SpO2:  96% 95% 97%  Weight:      Height:        Intake/Output Summary (Last 24 hours) at 04/05/2024 1231 Last data filed at 04/05/2024 1058 Gross per 24 hour  Intake 2100 ml  Output 1500 ml  Net 600 ml   Weight change: 2.302 kg Exam:  General:  Pt is alert, follows commands appropriately, not in acute distress HEENT: No icterus, No thrush, No neck mass, [place]/AT Cardiovascular: RRR, S1/S2, no rubs, no gallops Respiratory: CTA bilaterally, no wheezing, no crackles, no rhonchi Abdomen: Soft/+BS, non tender, non distended, no guarding Extremities: No edema, No lymphangitis, No petechiae, No rashes, no synovitis  Data Reviewed: I have personally reviewed following labs and imaging studies Basic Metabolic Panel: Recent Labs  Lab 04/04/24 0621 04/05/24 0349  NA 140 138  K 4.4 4.2  CL 104 107  CO2 23 23  GLUCOSE 197* 130*  BUN 44* 36*  CREATININE 1.09* 0.93  CALCIUM  8.4* 7.6*  MG  --  1.6*   Liver Function Tests: Recent Labs  Lab 04/04/24 0621  AST 22  ALT 13  ALKPHOS 68  BILITOT 0.2  PROT 6.7  ALBUMIN 3.8   No results for input(s): LIPASE, AMYLASE in the last 168 hours. No results for input(s): AMMONIA in the last  168 hours. Coagulation Profile: Recent Labs  Lab 04/04/24 0621  INR 1.0   CBC: Recent Labs  Lab 04/04/24 0621 04/04/24 1731 04/05/24 0349 04/05/24 1155  WBC 15.0*  --  15.8*  --   HGB 7.3* 7.7* 8.1* 7.6*  HCT 23.4* 22.9* 24.1* 23.1*  MCV 89.3  --  87.0  --   PLT 272  --  140*  --    Cardiac Enzymes: No results for input(s): CKTOTAL, CKMB, CKMBINDEX, TROPONINI in the last 168 hours. BNP: Invalid input(s): POCBNP CBG: Recent Labs  Lab 04/04/24 0610  GLUCAP 196*   HbA1C: Recent Labs    04/04/24 0838  HGBA1C 6.8*   Urine analysis:    Component Value Date/Time   COLORURINE AMBER (A) 04/04/2024 0934   APPEARANCEUR CLOUDY (A) 04/04/2024 0934   LABSPEC 1.019 04/04/2024 0934   PHURINE 5.0 04/04/2024 0934   GLUCOSEU NEGATIVE 04/04/2024 0934   HGBUR LARGE (A) 04/04/2024 0934   BILIRUBINUR NEGATIVE 04/04/2024 0934   KETONESUR NEGATIVE 04/04/2024 0934   PROTEINUR NEGATIVE 04/04/2024 0934   NITRITE NEGATIVE 04/04/2024 0934   LEUKOCYTESUR NEGATIVE 04/04/2024 0934   Sepsis Labs: @LABRCNTIP (procalcitonin:4,lacticidven:4) ) Recent Results (from the past 240 hours)  Culture, blood (Routine X 2) w Reflex to ID Panel     Status: None (Preliminary result)   Collection Time: 04/04/24  8:38 AM   Specimen: Left Antecubital; Blood  Result Value Ref Range Status   Specimen Description   Final    LEFT ANTECUBITAL BOTTLES DRAWN AEROBIC AND ANAEROBIC   Special Requests Blood Culture adequate volume  Final   Culture   Final    NO GROWTH < 24 HOURS Performed at [facility], [address] [place], [place] [address]    Report Status PENDING  Incomplete  Culture, blood (Routine X 2) w Reflex to ID Panel     Status: None (Preliminary result)   Collection Time: 04/04/24  8:38 AM   Specimen: BLOOD LEFT HAND  Result Value Ref Range Status   Specimen Description   Final    BLOOD LEFT HAND BOTTLES DRAWN AEROBIC AND ANAEROBIC   Special Requests Blood Culture adequate volume   Final   Culture   Final    NO GROWTH < 24 HOURS Performed at [facility], [address], [place], [place] [address]    Report Status PENDING  Incomplete  MRSA Next Gen by PCR, Nasal     Status: None   Collection Time: 04/04/24  9:05 AM   Specimen: Nasal Mucosa; Nasal Swab  Result Value Ref Range Status   MRSA by PCR Next Gen NOT DETECTED NOT DETECTED Final    Comment: (NOTE) The GeneXpert MRSA Assay (FDA approved for NASAL specimens only), is one component of a comprehensive MRSA colonization surveillance program. It is not intended to diagnose MRSA infection nor to guide or  monitor treatment for MRSA infections. Test performance is not FDA approved in patients less than 29 years old. Performed at [facility], [address] [place], [place] [address]      Scheduled Meds:  sodium chloride    Intravenous Once   atorvastatin   20 mg Oral Daily   Chlorhexidine Gluconate Cloth  6 each Topical Q0600   pantoprazole  (PROTONIX ) IV  40 mg Intravenous Q12H   Continuous Infusions:  Procedures/Studies: No results found.  Alm Schneider, DO  [facility]  If 7PM-7AM, please contact night-coverage [email or web address] Password [email or web address] 04/05/2024, 12:31 PM   LOS: 0 days

## 2024-04-05 NOTE — Anesthesia Preprocedure Evaluation (Signed)
 Anesthesia Evaluation  Patient identified by MRN, date of birth, ID band Patient awake    Reviewed: Allergy & Precautions, H&P , NPO status , Patient's Chart, lab work & pertinent test results, reviewed documented beta blocker date and time   Airway Mallampati: II  TM Distance: >3 FB Neck ROM: full    Dental no notable dental hx.    Pulmonary sleep apnea , Current Smoker and Patient abstained from smoking.   Pulmonary exam normal breath sounds clear to auscultation       Cardiovascular Exercise Tolerance: Good hypertension,  Rhythm:regular Rate:Normal     Neuro/Psych  Headaches PSYCHIATRIC DISORDERS Anxiety Depression       GI/Hepatic Neg liver ROS,GERD  ,,  Endo/Other  diabetes    Renal/GU negative Renal ROS  negative genitourinary   Musculoskeletal   Abdominal   Peds  Hematology  (+) Blood dyscrasia, anemia   Anesthesia Other Findings   Reproductive/Obstetrics negative OB ROS                              Anesthesia Physical Anesthesia Plan  ASA: 3 and emergent  Anesthesia Plan: MAC   Post-op Pain Management:    Induction:   PONV Risk Score and Plan: Propofol  infusion  Airway Management Planned:   Additional Equipment:   Intra-op Plan:   Post-operative Plan:   Informed Consent: I have reviewed the patients History and Physical, chart, labs and discussed the procedure including the risks, benefits and alternatives for the proposed anesthesia with the patient or authorized representative who has indicated his/her understanding and acceptance.     Dental Advisory Given  Plan Discussed with: CRNA  Anesthesia Plan Comments:         Anesthesia Quick Evaluation

## 2024-04-05 NOTE — Brief Op Note (Signed)
 Patient underwent EGD  under propofol  sedation.  Tolerated the procedure adequately.   FINDINGS:  - Normal esophagus. - Four non- bleeding angioectasias in the stomach. Treated with argon plasma coagulation ( APC) . - Three recently bleeding angioectasias in the duodenum. Treated with argon plasma coagulation ( APC) . - No specimens collected.  RECOMMENDATIONS  -Restart diet as tolerated  -Trend HBG  -if continued drop in HBg or overt bleeding will benefit from small bowel capsule -endoscopy + / - Colonoscopy  -PPI daily   Casey Carline [name] [name], MD Gastroenterology and Hepatology [facility] Gastroenterology

## 2024-04-05 NOTE — Progress Notes (Signed)
 Taken to endo for EGD

## 2024-04-05 NOTE — Care Management Obs Status (Signed)
 MEDICARE OBSERVATION STATUS NOTIFICATION   Patient Details  Name: Casey Johnston MRN: [id number] Date of Birth: [birth year]   Medicare Observation Status Notification Given:  Yes    Sharlyne Stabs, RN 04/05/2024, 10:17 AM

## 2024-04-05 NOTE — TOC Progression Note (Signed)
 Transition of Care [facility]) - Inpatient Brief Assessment   Patient Details  Name: Casey Johnston MRN: [id number] Date of Birth: [birth year]  Transition of Care [facility] CM/SW Contact:    Sharlyne Stabs, RN Phone Number: 04/05/2024, 10:55 AM   Clinical Narrative:  Patient admitted in OBS, 4 Units of blood give. OBS note completed. Patient going to ENDO. IPCM following.   Transition of Care Asessment: Insurance and Status: Insurance coverage has been reviewed Patient has primary care physician: Yes Home environment has been reviewed: HOme Prior level of function:: Independent Prior/Current Home Services: No current home services Social Drivers of Health Review: SDOH reviewed no interventions necessary Readmission risk has been reviewed: Yes Transition of care needs: no transition of care needs at this time

## 2024-04-05 NOTE — Plan of Care (Signed)
   Problem: Activity: Goal: Risk for activity intolerance will decrease Outcome: Progressing   Problem: Coping: Goal: Level of anxiety will decrease Outcome: Progressing

## 2024-04-05 NOTE — Progress Notes (Signed)
 Returned from [employer]. Has NS flowing in left arm at 20 mls. Hr.

## 2024-04-05 NOTE — Interval H&P Note (Signed)
 History and Physical Interval Note:  04/05/2024 10:37 AM  Casey Johnston  has presented today for surgery, with the diagnosis of Melena, upper Gi bleed.  The various methods of treatment have been discussed with the patient and family. After consideration of risks, benefits and other options for treatment, the patient has consented to  Procedure(s): EGD (ESOPHAGOGASTRODUODENOSCOPY) (N/A) as a surgical intervention.  The patient's history has been reviewed, patient examined, no change in status, stable for surgery.  I have reviewed the patient's chart and labs.  Questions were answered to the patient's satisfaction.     Casey Johnston

## 2024-04-05 NOTE — Progress Notes (Signed)
 Report given to 300 nurse and transported patient to room 310

## 2024-04-05 NOTE — Op Note (Signed)
 [facility] Patient Name: Casey Johnston Procedure Date: 04/05/2024 9:59 AM MRN: [id number] Date of Birth: 25-Sep-1960 Attending MD: Deatrice Dine , MD, [id number] CSN: [id number] Age: 63 Admit Type: Inpatient Procedure:                Upper GI endoscopy Indications:              Acute post hemorrhagic anemia, Melena Providers:                Deatrice Dine, MD, Olam Ada, RN, Gordy Lonni Balm, Technician Referring MD:              Medicines:                Monitored Anesthesia Care Complications:            No immediate complications. Estimated Blood Loss:     Estimated blood loss was minimal. Procedure:                Pre-Anesthesia Assessment:                           - Prior to the procedure, a History and Physical                            was performed, and patient medications and                            allergies were reviewed. The patient's tolerance of                            previous anesthesia was also reviewed. The risks                            and benefits of the procedure and the sedation                            options and risks were discussed with the patient.                            All questions were answered, and informed consent                            was obtained. Prior Anticoagulants: The patient has                            taken no anticoagulant or antiplatelet agents. ASA                            Grade Assessment: III - A patient with severe                            systemic disease. After reviewing the risks and  benefits, the patient was deemed in satisfactory                            condition to undergo the procedure.                           After obtaining informed consent, the endoscope was                            passed under direct vision. Throughout the                            procedure, the patient's blood pressure, pulse, and                             oxygen saturations were monitored continuously. The                            [id number] ([id number]) Upper was introduced through                            the mouth, and advanced to the second part of                            duodenum. The upper GI endoscopy was accomplished                            without difficulty. The patient tolerated the                            procedure well. Scope In: 10:44:39 AM Scope Out: 10:59:38 AM Total Procedure Duration: 0 hours 14 minutes 59 seconds  Findings:      The examined esophagus was normal.      Four small angioectasias with no bleeding were found in the gastric       antrum. Coagulation for hemostasis using argon plasma at 0.3       liters/minute and 20 watts was successful.      Three small angioectasias with stigmata of recent bleeding were found in       the duodenal bulb and in the second portion of the duodenum. Coagulation       for hemostasis using argon plasma at 0.3 liters/minute and 20 watts was       successful. Impression:               - Normal esophagus.                           - Four non-bleeding angioectasias in the stomach.                            Treated with argon plasma coagulation (APC).                           - Three recently bleeding angioectasias in the  duodenum. Treated with argon plasma coagulation                            (APC).                           - No specimens collected. Moderate Sedation:      Per Anesthesia Care Recommendation:           Restart diet as tolerated                           Trend HBG                           if continued drop in HBg or overt bleeding will                            benefit from small bowel capsule endoscopy +/-                            Colonoscopy                           PPI daily Procedure Code(s):        --- Professional ---                           [phone], Esophagogastroduodenoscopy, flexible,                             transoral; with control of bleeding, any method Diagnosis Code(s):        --- Professional ---                           K31.811, Angiodysplasia of stomach and duodenum                            with bleeding                           D62, Acute posthemorrhagic anemia                           K92.1, Melena (includes Hematochezia) CPT copyright 2022 American Medical Association. All rights reserved. The codes documented in this report are preliminary and upon coder review may  be revised to meet current compliance requirements. Deatrice Dine, MD Deatrice Dine, MD 04/05/2024 12:03:06 PM This report has been signed electronically. Number of Addenda: 0

## 2024-04-06 ENCOUNTER — Encounter (HOSPITAL_COMMUNITY): Payer: Self-pay | Admitting: Gastroenterology

## 2024-04-06 DIAGNOSIS — E1165 Type 2 diabetes mellitus with hyperglycemia: Secondary | ICD-10-CM | POA: Diagnosis not present

## 2024-04-06 DIAGNOSIS — E669 Obesity, unspecified: Secondary | ICD-10-CM | POA: Diagnosis not present

## 2024-04-06 DIAGNOSIS — K31819 Angiodysplasia of stomach and duodenum without bleeding: Secondary | ICD-10-CM | POA: Diagnosis not present

## 2024-04-06 DIAGNOSIS — D62 Acute posthemorrhagic anemia: Secondary | ICD-10-CM | POA: Diagnosis not present

## 2024-04-06 LAB — BASIC METABOLIC PANEL WITH GFR
Anion gap: 8 (ref 5–15)
BUN: 40 mg/dL — ABNORMAL HIGH (ref 8–23)
CO2: 22 mmol/L (ref 22–32)
Calcium: 7.6 mg/dL — ABNORMAL LOW (ref 8.9–10.3)
Chloride: 106 mmol/L (ref 98–111)
Creatinine, Ser: 1.07 mg/dL — ABNORMAL HIGH (ref 0.44–1.00)
GFR, Estimated: 58 mL/min — ABNORMAL LOW (ref >60)
Glucose, Bld: 257 mg/dL — ABNORMAL HIGH (ref 70–99)
Potassium: 5 mmol/L (ref 3.5–5.1)
Sodium: 137 mmol/L (ref 135–145)

## 2024-04-06 LAB — GLUCOSE, CAPILLARY
Glucose-Capillary: 154 mg/dL — ABNORMAL HIGH (ref 70–99)
Glucose-Capillary: 276 mg/dL — ABNORMAL HIGH (ref 70–99)
Glucose-Capillary: 278 mg/dL — ABNORMAL HIGH (ref 70–99)
Glucose-Capillary: 221 mg/dL — ABNORMAL HIGH (ref 70–99)
Glucose-Capillary: 217 mg/dL — ABNORMAL HIGH (ref 70–99)
Glucose-Capillary: 242 mg/dL — ABNORMAL HIGH (ref 70–99)

## 2024-04-06 LAB — HEMOGLOBIN AND HEMATOCRIT, BLOOD
HCT: 16.9 % — ABNORMAL LOW (ref 36.0–46.0)
Hemoglobin: 5.6 g/dL — CL (ref 12.0–15.0)

## 2024-04-06 LAB — CBC
HCT: 23.4 % — ABNORMAL LOW (ref 36.0–46.0)
Hemoglobin: 8 g/dL — ABNORMAL LOW (ref 12.0–15.0)
MCH: 29.7 pg (ref 26.0–34.0)
MCHC: 34.2 g/dL (ref 30.0–36.0)
MCV: 87 fL (ref 80.0–100.0)
Platelets: 128 K/uL — ABNORMAL LOW (ref 150–400)
RBC: 2.69 MIL/uL — ABNORMAL LOW (ref 3.87–5.11)
RDW: 14.6 % (ref 11.5–15.5)
WBC: 23.1 K/uL — ABNORMAL HIGH (ref 4.0–10.5)
nRBC: 0.1 % (ref 0.0–0.2)

## 2024-04-06 LAB — CBC WITH DIFFERENTIAL/PLATELET
Abs Immature Granulocytes: 0.13 K/uL — ABNORMAL HIGH (ref 0.00–0.07)
Basophils Absolute: 0.1 K/uL (ref 0.0–0.1)
Basophils Relative: 0 %
Eosinophils Absolute: 0 K/uL (ref 0.0–0.5)
Eosinophils Relative: 0 %
HCT: 16.7 % — ABNORMAL LOW (ref 36.0–46.0)
Hemoglobin: 5.6 g/dL — CL (ref 12.0–15.0)
Immature Granulocytes: 1 %
Lymphocytes Relative: 18 %
Lymphs Abs: 3.9 K/uL (ref 0.7–4.0)
MCH: 30.3 pg (ref 26.0–34.0)
MCHC: 33.5 g/dL (ref 30.0–36.0)
MCV: 90.3 fL (ref 80.0–100.0)
Monocytes Absolute: 1 K/uL (ref 0.1–1.0)
Monocytes Relative: 4 %
Neutro Abs: 16.4 K/uL — ABNORMAL HIGH (ref 1.7–7.7)
Neutrophils Relative %: 77 %
Platelets: 158 K/uL (ref 150–400)
RBC: 1.85 MIL/uL — ABNORMAL LOW (ref 3.87–5.11)
RDW: 14.4 % (ref 11.5–15.5)
WBC: 21.4 K/uL — ABNORMAL HIGH (ref 4.0–10.5)
nRBC: 0.1 % (ref 0.0–0.2)

## 2024-04-06 LAB — MAGNESIUM: Magnesium: 2 mg/dL (ref 1.7–2.4)

## 2024-04-06 LAB — PREPARE RBC (CROSSMATCH)

## 2024-04-06 MED ORDER — SODIUM CHLORIDE 0.9% IV SOLUTION: Freq: Once | INTRAVENOUS | Status: AC

## 2024-04-06 MED ORDER — BISACODYL 5 MG PO TBEC
10.0000 mg | DELAYED_RELEASE_TABLET | Freq: Once | ORAL | Status: AC
  Administered 2024-04-06: 10 mg via ORAL
  Filled 2024-04-06: qty 2

## 2024-04-06 MED ORDER — LACTATED RINGERS IV BOLUS: 500.0000 mL | Freq: Once | INTRAVENOUS | Status: DC

## 2024-04-06 MED ORDER — PEG 3350-KCL-NA BICARB-NACL 420 G PO SOLR
4000.0000 mL | Freq: Once | ORAL | Status: AC
  Administered 2024-04-06: 4000 mL via ORAL

## 2024-04-06 MED ORDER — INSULIN ASPART 100 UNIT/ML IJ SOLN
0.0000 [IU] | Freq: Every day | INTRAMUSCULAR | Status: DC
  Administered 2024-04-06: 2 [IU] via SUBCUTANEOUS
  Filled 2024-04-06: qty 1

## 2024-04-06 MED ORDER — INSULIN ASPART 100 UNIT/ML IJ SOLN
0.0000 [IU] | Freq: Three times a day (TID) | INTRAMUSCULAR | Status: DC
  Administered 2024-04-06 (×2): 8 [IU] via SUBCUTANEOUS
  Administered 2024-04-06: 5 [IU] via SUBCUTANEOUS
  Administered 2024-04-07 – 2024-04-08 (×5): 3 [IU] via SUBCUTANEOUS
  Administered 2024-04-09 (×2): 2 [IU] via SUBCUTANEOUS
  Administered 2024-04-10: 3 [IU] via SUBCUTANEOUS
  Administered 2024-04-10 – 2024-04-11 (×2): 2 [IU] via SUBCUTANEOUS
  Administered 2024-04-11: 3 [IU] via SUBCUTANEOUS
  Administered 2024-04-11 – 2024-04-12 (×2): 2 [IU] via SUBCUTANEOUS
  Administered 2024-04-12: 5 [IU] via SUBCUTANEOUS
  Filled 2024-04-06 (×16): qty 1

## 2024-04-06 MED ORDER — SODIUM CHLORIDE 0.9 % IV SOLN: INTRAVENOUS | Status: DC

## 2024-04-06 NOTE — Progress Notes (Signed)
 Progress Note  RN called to come to bedside due to patient losing consciousness after using the restroom.  Patient continued to have bright red blood per rectum on the use of commode. At bedside, patient complained of weakness and dizziness which worsens on standing.  Subjective  Hemoglobin/hematocrit was repeated and it was 5.6  BP (!) 205/53   Pulse (!) 104   Temp 98.2 F (36.8 C) (Oral)   Resp 19   Ht 5' 5 (1.651 m)   Wt 90.2 kg   SpO2 98%   BMI 33.09 kg/m    Gen:- Awake Alert, ill-appearing HEENT:- [place].AT, No sclera icterus Neck-Supple Neck,No JVD,.  Lungs-  CTAB  CV- S1, S2 normal Abd-  +ve B.Sounds, Abd Soft, No tenderness,    Extremity/Skin:- No  edema,   warm and dry Psych-affect is appropriate, oriented x3 Neuro-no new focal deficits, no tremors  Assessment and plan Acute blood loss anemia 2 more units of PRBC will be transfused Continue to monitor patient Patient is scheduled to have colonoscopy in the morning  Total time:  34 minutes This includes time reviewing the chart including progress notes, labs, EKGs, taking medical decisions, ordering labs and documenting findings.   Please refer to admission H&P, progress notes and consult notes for details regarding the care of this patient

## 2024-04-06 NOTE — Progress Notes (Signed)
 Pt ambulated to toilet bright red blood noted, when assisted back to bed pt appeared to lose consciousness in the bed for about 10 secounds, nurse assisted pt up in bed pt placed in trendelenburg position, pt regained consciousness but lethargic, vitals obtained, cbg obtained,  Dr Manfred notified, bright red blood noted from rectum.

## 2024-04-06 NOTE — Progress Notes (Signed)
 PROGRESS NOTE  Casey Johnston FMW:[id number] DOB: 1960/09/02 DOA: 04/04/2024 PCP: Teresa Jenkins Jansky, FNP  Brief History:  63 year old female with a history of hypertension, diabetes mellitus type 2, hyperlipidemia, GERD, IBS-C, and anxiety presenting with hematochezia and dark stools.  Patient stated that she first noticed some hematochezia on 04/01/2024.  Subsequently on the evening of 04/03/2024 she noted some dark and black stools mixed in with hematochezia.  She began feeling lightheaded and dizzy.  She felt like she had a fluttering sensation in her chest.  She denied any visual loss, focal extremity weakness.  She denies chest pain, shortness breath, nausea, vomiting.  She has had loose stools.  She denies any abdominal pain.  There is no dysuria or hematuria.  She does not take any blood thinners.  She denies any NSAIDs.  In the ED, the patient was afebrile hemodynamically stable with oxygen saturation 98% on room air.  WBC 15.0, hemoglobin 7.3, platelet 272.  Sodium 140, potassium 4.4, bicarbonate 23, serum creatinine 1.09.  LFTs unremarkable.  Lactic acid was 2.9.  Troponin 16.  EKG showed sinus rhythm, nonspecific ST changes.  GI was consulted assist with management.   Assessment/Plan: Acute blood loss anemia/symptomatic anemia -Presented with hemoglobin 7.3 - Previous baseline hemoglobin~12 - GI consult appreciated - Transfused 4 unit PRBC this admission - full liquid diet - Pantoprazole  twice daily - 11/16 EGD--4 angioectaasia w/o bleed antrum; 3 angioectasia with recent bleed duodenal bulb, 2 in duodenum---all treated with APC - 11/16 evening--rectal bleed; Hgb dropped from 8.1>>5.6 - GI reconsulted -Transfusing 2 additional units PRBC for total of 6 during this admission   Essential hypertension - BP is soft - Hold clonidine  - She is no longer taking metoprolol    Diabetes mellitus type 2 - NovoLog  sliding scale - Holding metformin  and glipizide  - She takes  Trulicity on Mondays - 11/15 A1C--6.8   Mixed hyperlipidemia - Continue statin   Lactic acidosis leukemoid reaction - Likely secondary to volume depletion from blood loss and stress demargination - Blood cultures x 2 sets--neg to date - UA--no pyuria           Family Communication:   son at bedside 11/16   Consultants:  GI   Code Status:  FULL    DVT Prophylaxis:  SCDs     Procedures: As Listed in Progress Note Above   Antibiotics: None      Subjective: Patient denies fevers, chills, headache, chest pain, dyspnea, nausea, vomiting, diarrhea, abdominal pain, dysuria, hematuria, hematochezia, and melena.   Objective: Vitals:   04/06/24 0700 04/06/24 0730 04/06/24 0744 04/06/24 0745  BP: (!) 171/58 135/85  134/89  Pulse: 88   60  Resp: 19   18  Temp:   98.5 F (36.9 C)   TempSrc:   Oral   SpO2: 99%   99%  Weight:      Height:        Intake/Output Summary (Last 24 hours) at 04/06/2024 0905 Last data filed at 04/06/2024 0547 Gross per 24 hour  Intake 1237.33 ml  Output --  Net 1237.33 ml   Weight change: -0.1 kg Exam:  General:  Pt is alert, follows commands appropriately, not in acute distress HEENT: No icterus, No thrush, No neck mass, [place]/AT Cardiovascular: RRR, S1/S2, no rubs, no gallops Respiratory: CTA bilaterally, no wheezing, no crackles, no rhonchi Abdomen: Soft/+BS, non tender, non distended, no guarding Extremities: No edema, No lymphangitis, No petechiae, No  rashes, no synovitis   Data Reviewed: I have personally reviewed following labs and imaging studies Basic Metabolic Panel: Recent Labs  Lab 04/04/24 0621 04/05/24 0349 04/06/24 0044  NA 140 138 137  K 4.4 4.2 5.0  CL 104 107 106  CO2 23 23 22   GLUCOSE 197* 130* 257*  BUN 44* 36* 40*  CREATININE 1.09* 0.93 1.07*  CALCIUM  8.4* 7.6* 7.6*  MG  --  1.6* 2.0   Liver Function Tests: Recent Labs  Lab 04/04/24 0621  AST 22  ALT 13  ALKPHOS 68  BILITOT 0.2  PROT 6.7   ALBUMIN 3.8   No results for input(s): LIPASE, AMYLASE in the last 168 hours. No results for input(s): AMMONIA in the last 168 hours. Coagulation Profile: Recent Labs  Lab 04/04/24 0621  INR 1.0   CBC: Recent Labs  Lab 04/04/24 0621 04/04/24 1731 04/05/24 0349 04/05/24 1155 04/06/24 0044  WBC 15.0*  --  15.8*  --  21.4*  NEUTROABS  --   --   --   --  16.4*  HGB 7.3* 7.7* 8.1* 7.6* 5.6*  HCT 23.4* 22.9* 24.1* 23.1* 16.7*  MCV 89.3  --  87.0  --  90.3  PLT 272  --  140*  --  158   Cardiac Enzymes: No results for input(s): CKTOTAL, CKMB, CKMBINDEX, TROPONINI in the last 168 hours. BNP: Invalid input(s): POCBNP CBG: Recent Labs  Lab 04/04/24 0610  GLUCAP 196*   HbA1C: Recent Labs    04/04/24 0838  HGBA1C 6.8*   Urine analysis:    Component Value Date/Time   COLORURINE AMBER (A) 04/04/2024 0934   APPEARANCEUR CLOUDY (A) 04/04/2024 0934   LABSPEC 1.019 04/04/2024 0934   PHURINE 5.0 04/04/2024 0934   GLUCOSEU NEGATIVE 04/04/2024 0934   HGBUR LARGE (A) 04/04/2024 0934   BILIRUBINUR NEGATIVE 04/04/2024 0934   KETONESUR NEGATIVE 04/04/2024 0934   PROTEINUR NEGATIVE 04/04/2024 0934   NITRITE NEGATIVE 04/04/2024 0934   LEUKOCYTESUR NEGATIVE 04/04/2024 0934   Sepsis Labs: @LABRCNTIP (procalcitonin:4,lacticidven:4) ) Recent Results (from the past 240 hours)  Culture, blood (Routine X 2) w Reflex to ID Panel     Status: None (Preliminary result)   Collection Time: 04/04/24  8:38 AM   Specimen: Left Antecubital; Blood  Result Value Ref Range Status   Specimen Description   Final    LEFT ANTECUBITAL BOTTLES DRAWN AEROBIC AND ANAEROBIC   Special Requests Blood Culture adequate volume  Final   Culture   Final    NO GROWTH 2 DAYS Performed at [facility], [address] [place], [place] [address]    Report Status PENDING  Incomplete  Culture, blood (Routine X 2) w Reflex to ID Panel     Status: None (Preliminary result)   Collection Time:  04/04/24  8:38 AM   Specimen: BLOOD LEFT HAND  Result Value Ref Range Status   Specimen Description   Final    BLOOD LEFT HAND BOTTLES DRAWN AEROBIC AND ANAEROBIC   Special Requests Blood Culture adequate volume  Final   Culture   Final    NO GROWTH 2 DAYS Performed at [facility], [address] [place], [place] [address]    Report Status PENDING  Incomplete  MRSA Next Gen by PCR, Nasal     Status: None   Collection Time: 04/04/24  9:05 AM   Specimen: Nasal Mucosa; Nasal Swab  Result Value Ref Range Status   MRSA by PCR Next Gen NOT DETECTED NOT DETECTED Final  Comment: (NOTE) The GeneXpert MRSA Assay (FDA approved for NASAL specimens only), is one component of a comprehensive MRSA colonization surveillance program. It is not intended to diagnose MRSA infection nor to guide or monitor treatment for MRSA infections. Test performance is not FDA approved in patients less than 58 years old. Performed at [facility], [address], [place], [place] [address]      Scheduled Meds:  sodium chloride    Intravenous Once   atorvastatin   20 mg Oral Daily   Chlorhexidine Gluconate Cloth  6 each Topical Q0600   insulin  aspart  0-15 Units Subcutaneous TID WC   insulin  aspart  0-5 Units Subcutaneous QHS   pantoprazole  (PROTONIX ) IV  40 mg Intravenous Q12H   Continuous Infusions:  Procedures/Studies: No results found.  Alm Schneider, DO  [facility]  If 7PM-7AM, please contact night-coverage [email or web address] Password [email or web address] 04/06/2024, 9:05 AM   LOS: 1 day

## 2024-04-06 NOTE — Progress Notes (Signed)
   04/06/24 0103  Provider Notification  Provider Name/Title Dr. Manfred  Date Provider Notified 04/06/24  Time Provider Notified 0104  Method of Notification Page  Notification Reason Critical Result  Test performed and critical result Hgb 5.6  Date Critical Result Received 04/06/24  Time Critical Result Received 0104  Provider response See new orders  Date of Provider Response 04/06/24

## 2024-04-06 NOTE — Progress Notes (Signed)
 Gastroenterology Progress Note     Patient ID: Casey Johnston; [id number]; 04-03-1961    Subjective   Feels fatigued. No vomiting or nausea. No abdominal pain. Last episode of bleeding around 1am. Overnight had ongoing lower GI bleeding with drop in Hgb to 5.6, associated dizziness, receiving 2 units PRBCs. No dizziness currently. Still fatigued. She had grits and ice cream for breakfast. She would like some juice today.    Objective   Vital signs in last 24 hours Temp:  [98.3 F (36.8 C)-99.3 F (37.4 C)] 98.5 F (36.9 C) (11/17 0800) Pulse Rate:  [60-102] 94 (11/17 1000) Resp:  [10-20] 20 (11/17 1000) BP: (103-171)/(41-89) 131/55 (11/17 1000) SpO2:  [93 %-100 %] 98 % (11/17 1000) Weight:  [90.2 kg] 90.2 kg (11/16 1400) Last BM Date : 04/06/24  Physical Exam General:   Alert and oriented, pleasant Head:  Normocephalic and atraumatic. Eyes:  No icterus, sclera clear. Conjuctiva pink.  Abdomen:  Bowel sounds present, soft, non-tender, non-distended. Neurologic:  Alert and  oriented x4 Psych:  Alert and cooperative. Normal mood and affect.  Intake/Output from previous day: 11/16 0701 - 11/17 0700 In: 1237.3 [P.O.:240; I.V.:682.3; Blood:315] Out: -  Intake/Output this shift: Total I/O In: 422 [I.V.:26; Blood:396] Out: -   Lab Results  Recent Labs    04/05/24 0349 04/05/24 1155 04/06/24 0044 04/06/24 1015  WBC 15.8*  --  21.4* 23.1*  HGB 8.1* 7.6* 5.6* 8.0*  HCT 24.1* 23.1* 16.7* 23.4*  PLT 140*  --  158 128*   BMET Recent Labs    04/04/24 0621 04/05/24 0349 04/06/24 0044  NA 140 138 137  K 4.4 4.2 5.0  CL 104 107 106  CO2 23 23 22   GLUCOSE 197* 130* 257*  BUN 44* 36* 40*  CREATININE 1.09* 0.93 1.07*  CALCIUM  8.4* 7.6* 7.6*   LFT Recent Labs    04/04/24 0621  PROT 6.7  ALBUMIN 3.8  AST 22  ALT 13  ALKPHOS 68  BILITOT 0.2   PT/INR Recent Labs    04/04/24 0621  LABPROT 14.0  INR 1.0   Assessment  63 y.o. female with a history of  hypertension, diabetes, HLD, OSA, GERD, IBS-C, presenting this admission with acute blood loss anemia with melena and hematochezia, admitting Hgb 7.3, with GI consulted for evaluation. Received 4 units PRBCs this admission and had recurrent bleeding overnight with need for additional 2 units after Hgb found to be 5.6 with improvement to 8.0 s/p transfusion.  Suspected rapid transit UGI bleed as EGD yesterday showed four non-bleeding angioectasias in stomach s/p APC and three recently bleeding angioectasias in duodenum s/p APC coagulation.   With recurrent acute blood loss anemia requiring transfusion, concern for persistent UGI bleed, less likely occult colonic lesion such as [place] or diverticular bleed, and unable to exclude additional small bowel AVMs. Prior colonoscopy Aug 2024 with internal hemorrhoids, diverticulosis, polyp, and stool in entire colon s/p copious lavage with result of good visualization. As prep was not ideal, recommend early interval colonoscopy this admission with EGD and enteroscopy tomorrow.   If she were to have recurrent bleeding today, she will need a stat CTA to help localize source of bleed.   Discussed this with patient, Dr. Evonnie, and nursing staff.     Plan / Recommendations  Follow H/H Continue PPI IV BID Clear liquids Will prep later today for colonoscopy, with EGD and enteroscopy tomorrow NPO after midnight Stat CTA for GI bleed if recurrent bleeding  LOS: 1 day    04/06/2024, 11:19 AM  [name], PhD, [facility] Gastroenterology

## 2024-04-06 NOTE — Progress Notes (Signed)
 Progress Note  RN called due to ongoing bleeding/Rectum.  Hemoglobin was 7.6 prior to [place] reported bleeding per rectum, H&H was repeated and hemoglobin has dropped to 5.6.  At bedside, patient felt weak and complained of dizziness which worsens when she gets up from the bed.  2 units of PRBCs was ordered to be transfused.  Patient was transferred to SDU  Physical Exam BP (!) 103/49 (BP Location: Left Arm)   Pulse 94   Temp 99.3 F (37.4 C) (Oral)   Resp 16   Ht 5' 5 (1.651 m)   Wt 90.2 kg   SpO2 93%   BMI 33.09 kg/m   Gen:- Awake Alert, and oriented x 3 HEENT:- Bent.AT, No sclera icterus Neck-Supple Neck,No JVD,.  Lungs-  CTAB  CV- S1, S2 normal Abd-  +ve B.Sounds, Abd Soft, No tenderness,    Extremity/Skin:- No  edema,   warm and dry Psych-affect is appropriate, oriented x3 Neuro-no new focal deficits, no tremors  Assessment and plan GI bleed/ABLA Hemoglobin dropped to 5.6 from 7.6 2 units of PRBCs ordered for transfusion Continue Protonix   Gastroenterology is following  Total time:  35 minutes This includes time reviewing the chart including progress notes, labs, EKGs, taking medical decisions, ordering labs and documenting findings.  Please refer to admission H&P, progress note and consult note for details regarding the care of this patient.

## 2024-04-06 NOTE — Inpatient Diabetes Management (Addendum)
 Inpatient Diabetes Program Recommendations  AACE/ADA: New Consensus Statement on Inpatient Glycemic Control   Target Ranges:  Prepandial:   less than 140 mg/dL      Peak postprandial:   less than 180 mg/dL (1-2 hours)      Critically ill patients:  140 - 180 mg/dL    Latest Reference Range & Units 04/04/24 06:10  Glucose-Capillary 70 - 99 mg/dL 803 (H)    Latest Reference Range & Units 04/05/24 03:49 04/06/24 00:44  Glucose 70 - 99 mg/dL 869 (H) 742 (H)    Latest Reference Range & Units 04/04/24 08:38  Hemoglobin A1C 4.8 - 5.6 % 6.8 (H)   Review of Glycemic Control  Diabetes history: DM2 Outpatient Diabetes medications: Trulicity 0.75 mg Qweek (Monday), Glipizide  XL 5 mg QAM, Metformin  1000 BID Current orders for Inpatient glycemic control: None  Inpatient Diabetes Program Recommendations:    Insulin : Please consider ordering CBGs AC&HS and Novolog  0-15 units TID with meals and Novolog  0-5 units at bedtime.  Thanks, Earnie Gainer, RN, MSN, CDCES Diabetes Coordinator Inpatient Diabetes Program [phone] (Team Pager from 8am to 5pm)

## 2024-04-07 ENCOUNTER — Inpatient Hospital Stay (HOSPITAL_COMMUNITY): Admitting: Certified Registered"

## 2024-04-07 ENCOUNTER — Encounter (HOSPITAL_COMMUNITY): Payer: Self-pay | Admitting: Internal Medicine

## 2024-04-07 ENCOUNTER — Encounter (HOSPITAL_COMMUNITY)
Admission: EM | Disposition: A | Discharge status: Home / Self Care | Payer: Self-pay | Source: Home / Self Care | Attending: Internal Medicine

## 2024-04-07 DIAGNOSIS — I10 Essential (primary) hypertension: Secondary | ICD-10-CM | POA: Diagnosis not present

## 2024-04-07 DIAGNOSIS — K269 Duodenal ulcer, unspecified as acute or chronic, without hemorrhage or perforation: Secondary | ICD-10-CM | POA: Diagnosis not present

## 2024-04-07 DIAGNOSIS — E669 Obesity, unspecified: Secondary | ICD-10-CM | POA: Diagnosis not present

## 2024-04-07 DIAGNOSIS — D124 Benign neoplasm of descending colon: Secondary | ICD-10-CM

## 2024-04-07 DIAGNOSIS — K259 Gastric ulcer, unspecified as acute or chronic, without hemorrhage or perforation: Secondary | ICD-10-CM

## 2024-04-07 DIAGNOSIS — K648 Other hemorrhoids: Secondary | ICD-10-CM | POA: Diagnosis not present

## 2024-04-07 DIAGNOSIS — K31819 Angiodysplasia of stomach and duodenum without bleeding: Secondary | ICD-10-CM | POA: Diagnosis not present

## 2024-04-07 DIAGNOSIS — D649 Anemia, unspecified: Secondary | ICD-10-CM | POA: Diagnosis not present

## 2024-04-07 DIAGNOSIS — D123 Benign neoplasm of transverse colon: Secondary | ICD-10-CM

## 2024-04-07 DIAGNOSIS — E119 Type 2 diabetes mellitus without complications: Secondary | ICD-10-CM | POA: Diagnosis not present

## 2024-04-07 DIAGNOSIS — K5730 Diverticulosis of large intestine without perforation or abscess without bleeding: Secondary | ICD-10-CM | POA: Diagnosis not present

## 2024-04-07 DIAGNOSIS — K921 Melena: Secondary | ICD-10-CM

## 2024-04-07 DIAGNOSIS — D62 Acute posthemorrhagic anemia: Secondary | ICD-10-CM | POA: Diagnosis not present

## 2024-04-07 HISTORY — PX: ESOPHAGOGASTRODUODENOSCOPY: SHX5428

## 2024-04-07 HISTORY — PX: COLONOSCOPY: SHX5424

## 2024-04-07 LAB — HEMOGLOBIN AND HEMATOCRIT, BLOOD
HCT: 24.8 % — ABNORMAL LOW (ref 36.0–46.0)
HCT: 21.4 % — ABNORMAL LOW (ref 36.0–46.0)
Hemoglobin: 8.5 g/dL — ABNORMAL LOW (ref 12.0–15.0)
Hemoglobin: 7.1 g/dL — ABNORMAL LOW (ref 12.0–15.0)

## 2024-04-07 LAB — GLUCOSE, CAPILLARY
Glucose-Capillary: 157 mg/dL — ABNORMAL HIGH (ref 70–99)
Glucose-Capillary: 146 mg/dL — ABNORMAL HIGH (ref 70–99)
Glucose-Capillary: 166 mg/dL — ABNORMAL HIGH (ref 70–99)
Glucose-Capillary: 160 mg/dL — ABNORMAL HIGH (ref 70–99)

## 2024-04-07 MED ORDER — HYDRALAZINE HCL 20 MG/ML IJ SOLN: 10.0000 mg | Freq: Four times a day (QID) | INTRAMUSCULAR | Status: DC | PRN

## 2024-04-07 MED ORDER — GLUCAGON HCL RDNA (DIAGNOSTIC) 1 MG IJ SOLR
INTRAMUSCULAR | Status: DC | PRN
  Administered 2024-04-07: .25 mg via INTRAVENOUS

## 2024-04-07 MED ORDER — KETAMINE HCL 50 MG/5ML IJ SOSY
PREFILLED_SYRINGE | INTRAMUSCULAR | Status: AC
  Filled 2024-04-07: qty 5

## 2024-04-07 MED ORDER — GLUCAGON HCL RDNA (DIAGNOSTIC) 1 MG IJ SOLR
INTRAMUSCULAR | Status: AC
  Filled 2024-04-07: qty 1

## 2024-04-07 MED ORDER — PROPOFOL 10 MG/ML IV BOLUS
INTRAVENOUS | Status: DC | PRN
Start: 2024-04-07 — End: 2024-04-07
  Administered 2024-04-07: 125 ug/kg/min via INTRAVENOUS
  Administered 2024-04-07: 70 mg via INTRAVENOUS

## 2024-04-07 MED ORDER — LACTATED RINGERS IV SOLN: INTRAVENOUS | Status: DC

## 2024-04-07 MED ORDER — KETAMINE HCL 10 MG/ML IJ SOLN
INTRAMUSCULAR | Status: DC | PRN
  Administered 2024-04-07: 30 mg via INTRAVENOUS
  Administered 2024-04-07 (×2): 10 mg via INTRAVENOUS

## 2024-04-07 MED ORDER — LIDOCAINE HCL (CARDIAC) PF 100 MG/5ML IV SOSY
PREFILLED_SYRINGE | INTRAVENOUS | Status: DC | PRN
  Administered 2024-04-07: 50 mg via INTRAVENOUS

## 2024-04-07 NOTE — Transfer of Care (Signed)
 Immediate Anesthesia Transfer of Care Note  Patient: Casey Johnston  Procedure(s) Performed: COLONOSCOPY EGD (ESOPHAGOGASTRODUODENOSCOPY)  Patient Location: PACU  Anesthesia Type:General  Level of Consciousness: drowsy, patient cooperative, and responds to stimulation  Airway & Oxygen Therapy: Patient Spontanous Breathing and Patient connected to nasal cannula oxygen  Post-op Assessment: Report given to RN and Post -op Vital signs reviewed and stable  Post vital signs: Reviewed and stable  Last Vitals:  Vitals Value Taken Time  BP 98/29 04/07/24 12:05  Temp 36.6 C 04/07/24 12:05  Pulse 84 04/07/24 12:08  Resp 21 04/07/24 12:08  SpO2 99 % 04/07/24 12:08  Vitals shown include unfiled device data.  Last Pain:  Vitals:   04/07/24 1038  TempSrc:   PainSc: 0-No pain         Complications: No notable events documented.

## 2024-04-07 NOTE — Progress Notes (Signed)
 PROGRESS NOTE  Casey Johnston FMW:[id number] DOB: 04-23-1961 DOA: 04/04/2024 PCP: Teresa Jenkins Jansky, FNP  Brief History:  63 year old female with a history of hypertension, diabetes mellitus type 2, hyperlipidemia, GERD, IBS-C, and anxiety presenting with hematochezia and dark stools.  Patient stated that she first noticed some hematochezia on 04/01/2024.  Subsequently on the evening of 04/03/2024 she noted some dark and black stools mixed in with hematochezia.  She began feeling lightheaded and dizzy.  She felt like she had a fluttering sensation in her chest.  She denied any visual loss, focal extremity weakness.  She denies chest pain, shortness breath, nausea, vomiting.  She has had loose stools.  She denies any abdominal pain.  There is no dysuria or hematuria.  She does not take any blood thinners.  She denies any NSAIDs.  In the ED, the patient was afebrile hemodynamically stable with oxygen saturation 98% on room air.  WBC 15.0, hemoglobin 7.3, platelet 272.  Sodium 140, potassium 4.4, bicarbonate 23, serum creatinine 1.09.  LFTs unremarkable.  Lactic acid was 2.9.  Troponin 16.  EKG showed sinus rhythm, nonspecific ST changes.  GI was consulted assist with management.  Patient underwent EGD on 04/05/2024 with results discussed below.  She was continued on pantoprazole  twice daily.  She has been transfused 4 units PRBC total for the admission.  At the fair to the medical floor, the patient had another episode of rectal bleeding.  Hemoglobin dropped back down from 8.1-5.6.  She was moved back down to the stepdown unit.  GI was reconsulted.   Assessment/Plan:  Acute blood loss anemia/symptomatic anemia -Presented with hemoglobin 7.3 - Previous baseline hemoglobin~12 - GI consult appreciated - Transfused 8 unit PRBC this admission - clear liquid diet - Pantoprazole  twice daily - 11/16 EGD--4 angioectaasia w/o bleed antrum; 3 angioectasia with recent bleed duodenal bulb, 2 in  duodenum---all treated with APC - 11/16 evening--rectal bleed; Hgb dropped from 8.1>>5.6 - 11/17 evening--rectal bleed, Hgb dropped 8.0>>5.6 ; had syncope -Transfused 2 additional units PRBC for total of 8 during this admission - 11/18 --colonoscopy--Blood in the entire examined colon; polyps in transverse and descending colon; nonbleeding internal hemorrhoids - 11/18 --enteroscopy non-bleeding gastric and duodenal ulcer -monitor H/H q 12 hours>>CTA GI bleed scan if rebleeds   Essential hypertension - BP is soft - Hold clonidine  - She is no longer taking metoprolol    Diabetes mellitus type 2 - NovoLog  sliding scale - Holding metformin  and glipizide  - She takes Trulicity on Mondays - 11/15 A1C--6.8   Mixed hyperlipidemia - Continue statin   Lactic acidosis leukemoid reaction - Likely secondary to volume depletion from blood loss and stress demargination - Blood cultures x 2 sets--neg to date - UA--no pyuria             Family Communication:   son at bedside 11/16   Consultants:  GI   Code Status:  FULL    DVT Prophylaxis:  SCDs     Procedures: As Listed in Progress Note Above   Antibiotics: None           Subjective: Patient denies fevers, chills, headache, chest pain, dyspnea, nausea, vomiting, diarrhea, abdominal pain, dysuria, hematuria, hematochezia, and melena.   Objective: Vitals:   04/07/24 1205 04/07/24 1211 04/07/24 1215 04/07/24 1224  BP: (!) 98/29 (!) 125/42 (!) 119/45 (!) 133/48  Pulse: 79 85 99 81  Resp: 14 18 18 18   Temp: 97.8 F (36.6 C)  98.2 F (36.8 C)  TempSrc:      SpO2: 98% 100% 97% 100%  Weight:      Height:        Intake/Output Summary (Last 24 hours) at 04/07/2024 1443 Last data filed at 04/07/2024 9380 Gross per 24 hour  Intake 815.47 ml  Output --  Net 815.47 ml   Weight change:  Exam:  General:  Pt is alert, follows commands appropriately, not in acute distress HEENT: No icterus, No thrush, No neck mass,  [place]/AT Cardiovascular: RRR, S1/S2, no rubs, no gallops Respiratory: CTA bilaterally, no wheezing, no crackles, no rhonchi Abdomen: Soft/+BS, non tender, non distended, no guarding Extremities: No edema, No lymphangitis, No petechiae, No rashes, no synovitis   Data Reviewed: I have personally reviewed following labs and imaging studies Basic Metabolic Panel: Recent Labs  Lab 04/04/24 0621 04/05/24 0349 04/06/24 0044  NA 140 138 137  K 4.4 4.2 5.0  CL 104 107 106  CO2 23 23 22   GLUCOSE 197* 130* 257*  BUN 44* 36* 40*  CREATININE 1.09* 0.93 1.07*  CALCIUM  8.4* 7.6* 7.6*  MG  --  1.6* 2.0   Liver Function Tests: Recent Labs  Lab 04/04/24 0621  AST 22  ALT 13  ALKPHOS 68  BILITOT 0.2  PROT 6.7  ALBUMIN 3.8   No results for input(s): LIPASE, AMYLASE in the last 168 hours. No results for input(s): AMMONIA in the last 168 hours. Coagulation Profile: Recent Labs  Lab 04/04/24 0621  INR 1.0   CBC: Recent Labs  Lab 04/04/24 0621 04/04/24 1731 04/05/24 0349 04/05/24 1155 04/06/24 0044 04/06/24 1015 04/06/24 2235 04/07/24 0520  WBC 15.0*  --  15.8*  --  21.4* 23.1*  --   --   NEUTROABS  --   --   --   --  16.4*  --   --   --   HGB 7.3*   < > 8.1* 7.6* 5.6* 8.0* 5.6* 8.5*  HCT 23.4*   < > 24.1* 23.1* 16.7* 23.4* 16.9* 24.8*  MCV 89.3  --  87.0  --  90.3 87.0  --   --   PLT 272  --  140*  --  158 128*  --   --    < > = values in this interval not displayed.   Cardiac Enzymes: No results for input(s): CKTOTAL, CKMB, CKMBINDEX, TROPONINI in the last 168 hours. BNP: Invalid input(s): POCBNP CBG: Recent Labs  Lab 04/06/24 1618 04/06/24 2110 04/06/24 2217 04/07/24 0718 04/07/24 1021  GLUCAP 221* 217* 242* 157* 146*   HbA1C: No results for input(s): HGBA1C in the last 72 hours. Urine analysis:    Component Value Date/Time   COLORURINE AMBER (A) 04/04/2024 0934   APPEARANCEUR CLOUDY (A) 04/04/2024 0934   LABSPEC 1.019 04/04/2024 0934    PHURINE 5.0 04/04/2024 0934   GLUCOSEU NEGATIVE 04/04/2024 0934   HGBUR LARGE (A) 04/04/2024 0934   BILIRUBINUR NEGATIVE 04/04/2024 0934   KETONESUR NEGATIVE 04/04/2024 0934   PROTEINUR NEGATIVE 04/04/2024 0934   NITRITE NEGATIVE 04/04/2024 0934   LEUKOCYTESUR NEGATIVE 04/04/2024 0934   Sepsis Labs: @LABRCNTIP (procalcitonin:4,lacticidven:4) ) Recent Results (from the past 240 hours)  Culture, blood (Routine X 2) w Reflex to ID Panel     Status: None (Preliminary result)   Collection Time: 04/04/24  8:38 AM   Specimen: Left Antecubital; Blood  Result Value Ref Range Status   Specimen Description   Final    LEFT ANTECUBITAL BOTTLES DRAWN AEROBIC AND  ANAEROBIC   Special Requests Blood Culture adequate volume  Final   Culture   Final    NO GROWTH 3 DAYS Performed at [facility], [address], [place], [place] [address]    Report Status PENDING  Incomplete  Culture, blood (Routine X 2) w Reflex to ID Panel     Status: None (Preliminary result)   Collection Time: 04/04/24  8:38 AM   Specimen: BLOOD LEFT HAND  Result Value Ref Range Status   Specimen Description   Final    BLOOD LEFT HAND BOTTLES DRAWN AEROBIC AND ANAEROBIC   Special Requests Blood Culture adequate volume  Final   Culture   Final    NO GROWTH 3 DAYS Performed at [facility], [address], [place], [place] [address]    Report Status PENDING  Incomplete  MRSA Next Gen by PCR, Nasal     Status: None   Collection Time: 04/04/24  9:05 AM   Specimen: Nasal Mucosa; Nasal Swab  Result Value Ref Range Status   MRSA by PCR Next Gen NOT DETECTED NOT DETECTED Final    Comment: (NOTE) The GeneXpert MRSA Assay (FDA approved for NASAL specimens only), is one component of a comprehensive MRSA colonization surveillance program. It is not intended to diagnose MRSA infection nor to guide or monitor treatment for MRSA infections. Test performance is not FDA approved in patients less than 64 years old. Performed at [facility], [address], [place], [place] [address]      Scheduled Meds:  sodium chloride    Intravenous Once   atorvastatin   20 mg Oral Daily   Chlorhexidine Gluconate Cloth  6 each Topical Q0600   insulin  aspart  0-15 Units Subcutaneous TID WC   insulin  aspart  0-5 Units Subcutaneous QHS   pantoprazole  (PROTONIX ) IV  40 mg Intravenous Q12H   Continuous Infusions:  lactated ringers  Stopped (04/06/24 2315)    Procedures/Studies: No results found.  Alm Schneider, DO  [facility]  If 7PM-7AM, please contact night-coverage [email or web address] Password [email or web address] 04/07/2024, 2:43 PM   LOS: 2 days

## 2024-04-07 NOTE — Plan of Care (Signed)

## 2024-04-07 NOTE — Progress Notes (Signed)
 We will proceed with colonoscopy and enteroscopy as scheduled.  I thoroughly discussed with the patient the procedure, including the risks involved. Patient understands what the procedure involves including the benefits and any risks. Patient understands alternatives to the proposed procedure. Risks including (but not limited to) bleeding, tearing of the lining (perforation), rupture of adjacent organs, problems with heart and lung function, infection, and medication reactions. A small percentage of complications may require surgery, hospitalization, repeat endoscopic procedure, and/or transfusion.  Patient understood and agreed.  Toribio Fortune, MD Gastroenterology and Hepatology [facility] Gastroenterology

## 2024-04-07 NOTE — Op Note (Signed)
 [facility] Patient Name: Casey Johnston Procedure Date: 04/07/2024 10:25 AM MRN: [id number] Date of Birth: 1961/01/05 Attending MD: Toribio Fortune , , [id number] CSN: [id number] Age: 63 Admit Type: Outpatient Procedure:                Colonoscopy Indications:              Evaluation of unexplained GI bleeding presenting                            with Hematochezia, Acute post hemorrhagic anemia Providers:                Toribio Fortune, Leandrew Edelman RN, RN, Bascom Blush Referring MD:              Medicines:                Monitored Anesthesia Care Complications:            No immediate complications. Estimated Blood Loss:     Estimated blood loss: none. Procedure:                Pre-Anesthesia Assessment:                           - Prior to the procedure, a History and Physical                            was performed, and patient medications, allergies                            and sensitivities were reviewed. The patient's                            tolerance of previous anesthesia was reviewed.                           - The risks and benefits of the procedure and the                            sedation options and risks were discussed with the                            patient. All questions were answered and informed                            consent was obtained.                           - ASA Grade Assessment: III - A patient with severe                            systemic disease.                           After obtaining informed consent, the colonoscope                            was passed under  direct vision. Throughout the                            procedure, the patient's blood pressure, pulse, and                            oxygen saturations were monitored continuously. The                            PCF-HQ190L ([id number]) was introduced through the                            anus and advanced to the the terminal ileum. The                             colonoscopy was performed with difficulty due to                            large amount of hematochezia. The patient tolerated                            the procedure well. Scope In: 10:43:08 AM Scope Out: 11:39:56 AM Scope Withdrawal Time: 0 hours 34 minutes 1 second  Total Procedure Duration: 0 hours 56 minutes 48 seconds  Findings:      The perianal and digital rectal examinations were normal.      The terminal ileum appeared normal. Upon careful inspection, no presence       of hematin or active bleeding was present.      Hematin (altered blood/port wine blood) was found in the entire colon.       There was presence of more blood contents in the left side of the colon.       However, despite thorough lavage, could not identify a source of active       bleeding.      Two sessile polyps were found in the transverse colon. The polyps were 2       to 4 mm in size. These polyps were removed with a cold snare. Resection       and retrieval were complete.      A 6 mm polyp was found in the descending colon. The polyp was sessile.       The polyp was removed with a cold snare. Resection and retrieval were       complete.      A few small-mouthed diverticula were found in the sigmoid colon and       ascending colon.      Non-bleeding internal hemorrhoids were found during retroflexion. The       hemorrhoids were small. Impression:               - The examined portion of the ileum was normal.                           - Blood in the entire examined colon.                           - Two 2 to 4 mm polyps in the transverse  colon,                            removed with a cold snare. Resected and retrieved.                           - One 6 mm polyp in the descending colon, removed                            with a cold snare. Resected and retrieved.                           - Diverticulosis in the sigmoid colon and in the                            ascending colon.                            - Non-bleeding internal hemorrhoids. Moderate Sedation:      Per Anesthesia Care Recommendation:           - Await pathology results.                           - Repeat colonoscopy for surveillance based on                            pathology results.                           - Proceed with enteroscopy. Procedure Code(s):        --- Professional ---                           [phone], Colonoscopy, flexible; with removal of                            tumor(s), polyp(s), or other lesion(s) by snare                            technique Diagnosis Code(s):        --- Professional ---                           K64.8, Other hemorrhoids                           K92.2, Gastrointestinal hemorrhage, unspecified                           D12.3, Benign neoplasm of transverse colon (hepatic                            flexure or splenic flexure)                           D12.4, Benign neoplasm of descending colon  K92.1, Melena (includes Hematochezia)                           D62, Acute posthemorrhagic anemia                           K57.30, Diverticulosis of large intestine without                            perforation or abscess without bleeding CPT copyright 2022 American [employer]. All rights reserved. The codes documented in this report are preliminary and upon coder review may  be revised to meet current compliance requirements. Toribio Fortune, MD Toribio Fortune,  04/07/2024 12:08:17 PM This report has been signed electronically. Number of Addenda: 0

## 2024-04-07 NOTE — Anesthesia Preprocedure Evaluation (Addendum)
 Anesthesia Evaluation  Patient identified by MRN, date of birth, ID band Patient awake    Reviewed: Allergy & Precautions, H&P , NPO status , Patient's Chart, lab work & pertinent test results, reviewed documented beta blocker date and time   Airway Mallampati: II  TM Distance: >3 FB Neck ROM: full    Dental no notable dental hx. (+) Dental Advisory Given, Teeth Intact   Pulmonary sleep apnea , Current Smoker and Patient abstained from smoking.   Pulmonary exam normal breath sounds clear to auscultation       Cardiovascular Exercise Tolerance: Good hypertension,  Rhythm:regular Rate:Normal     Neuro/Psych  Headaches PSYCHIATRIC DISORDERS Anxiety Depression       GI/Hepatic Neg liver ROS,GERD  ,,  Endo/Other  diabetes, Poorly Controlled, Type 2    Renal/GU negative Renal ROS  negative genitourinary   Musculoskeletal   Abdominal   Peds  Hematology  (+) Blood dyscrasia, anemia Hgb 8.5   Anesthesia Other Findings   Reproductive/Obstetrics negative OB ROS                              Anesthesia Physical Anesthesia Plan  ASA: 3 and emergent  Anesthesia Plan: General   Post-op Pain Management: Minimal or no pain anticipated   Induction:   PONV Risk Score and Plan: Propofol  infusion  Airway Management Planned: Natural Airway and Nasal Cannula  Additional Equipment: None  Intra-op Plan:   Post-operative Plan:   Informed Consent: I have reviewed the patients History and Physical, chart, labs and discussed the procedure including the risks, benefits and alternatives for the proposed anesthesia with the patient or authorized representative who has indicated his/her understanding and acceptance.     Dental Advisory Given  Plan Discussed with: CRNA  Anesthesia Plan Comments:          Anesthesia Quick Evaluation

## 2024-04-07 NOTE — Brief Op Note (Signed)
 04/07/2024  12:06 PM  PATIENT:  Casey Johnston  63 y.o. female  PRE-OPERATIVE DIAGNOSIS:  acute blood loss anemia  POST-OPERATIVE DIAGNOSIS:  Colon:diverticulosis, Transverese colon polypx2CS), descending x1(CS),hemorrhoids post ablation ulcersx3, ablation ulcer in duodenum,  PROCEDURE:  Procedure(s) with comments: COLONOSCOPY (N/A) EGD (ESOPHAGOGASTRODUODENOSCOPY) (N/A) - WITH ENTEROSCOPY  SURGEON:  Surgeons and Role:    * Eartha Angelia Sieving, MD - Primary  Patient underwent colonoscopy and enteroscopy under propofol  sedation.  Tolerated the procedure adequately.   COLONOSCOPY FINDINGS: - The examined portion of the ileum was normal.  - Blood in the entire examined colon.  - Two 2 to 4 mm polyps in the transverse colon, removed with a cold snare.  Resected and retrieved.  - One 6 mm polyp in the descending colon, removed with a cold snare.  Resected and retrieved.  - Non-bleeding internal hemorrhoids.   ENTEROSCOPY FINDINGS: - Normal esophagus.  - Non-bleeding gastric ulcers. These were postablation ulcers. - Non-bleeding duodenal ulcer with a clean ulcer base (Forrest Class III). This was postablation ulcer. - Normal examined jejunum.   RECOMMENDATIONS - Return patient to hospital ward for ongoing care.  - Clear liquid diet.  - H/H every 12 hours. - PPI BID. - If presenting overt bleeding with recurrent drop in hemoglobing or hemodynamic instability, perform CT Angio bleed protocol STAT - consult IR if active bleeding is identified. - If recurrent anemia but stable, may proceed with capsule endoscopy while in house. - Await pathology results.  - Repeat colonoscopy for surveillance based on pathology results.   Sieving Eartha, MD Gastroenterology and Hepatology [facility]

## 2024-04-07 NOTE — Op Note (Signed)
 [facility] Patient Name: Casey Johnston Procedure Date: 04/07/2024 10:28 AM MRN: [id number] Date of Birth: 1961-03-18 Attending MD: Toribio Fortune , , [id number] CSN: [id number] Age: 63 Admit Type: Outpatient Procedure:                Upper GI endoscopy Indications:              Hematochezia Providers:                Toribio Fortune, Leandrew Edelman RN, RN, Bascom Blush Referring MD:              Medicines:                Monitored Anesthesia Care Complications:            No immediate complications. Estimated Blood Loss:     Estimated blood loss: none. Procedure:                Pre-Anesthesia Assessment:                           - Prior to the procedure, a History and Physical                            was performed, and patient medications, allergies                            and sensitivities were reviewed. The patient's                            tolerance of previous anesthesia was reviewed.                           - The risks and benefits of the procedure and the                            sedation options and risks were discussed with the                            patient. All questions were answered and informed                            consent was obtained.                           - ASA Grade Assessment: III - A patient with severe                            systemic disease.                           After obtaining informed consent, the endoscope was                            passed under direct vision. Throughout the                            procedure, the patient's blood pressure,  pulse, and                            oxygen saturations were monitored continuously. The                            [id number]-PH190L ([id number]) Ultra Slim Colon was                            introduced through the mouth, and advanced to the                            proximal jejunum. The upper GI endoscopy was                            accomplished without difficulty. The  patient                            tolerated the procedure well. Scope In: 11:44:43 AM Scope Out: 12:00:08 PM Total Procedure Duration: 0 hours 15 minutes 25 seconds  Findings:      The examined esophagus was normal.      Four non-bleeding cratered gastric ulcers were found in the gastric       antrum. The largest lesion was 8 mm in largest dimension. These were       postablation ulcers.      One non-bleeding superficial duodenal ulcer with a clean ulcer base       (Forrest Class III) was found in the duodenal bulb. The lesion was 8 mm       in largest dimension. This was postablation ulcer.      The examined jejunum was normal.      Unclear if severe GI bleeding was related to possible diverticular       bleeding. Impression:               - Normal esophagus.                           - Non-bleeding gastric ulcers. These were                            postablation ulcers.                           - Non-bleeding duodenal ulcer with a clean ulcer                            base (Forrest Class III). This was postablation                            ulcer.                           - Normal examined jejunum.                           - No specimens collected. Moderate Sedation:      Per Anesthesia Care Recommendation:           -  Return patient to hospital ward for ongoing care.                           - Clear liquid diet.                           - H/H every 12 hours.                           - PPI BID.                           - If presenting overt bleeding with recurrent drop                            in hemoglobing or hemodynamic instability, perform                            CT Angio bleed protocol STAT - consult IR if active                            bleeding is identified.                           - If recurrent anemia but stable, may proceed with                            capsule endoscopy while in house. Procedure Code(s):        --- Professional ---                            [phone], Esophagogastroduodenoscopy, flexible,                            transoral; diagnostic, including collection of                            specimen(s) by brushing or washing, when performed                            (separate procedure) Diagnosis Code(s):        --- Professional ---                           K25.9, Gastric ulcer, unspecified as acute or                            chronic, without hemorrhage or perforation                           K26.9, Duodenal ulcer, unspecified as acute or                            chronic, without hemorrhage or perforation  K92.1, Melena (includes Hematochezia) CPT copyright 2022 American Medical Association. All rights reserved. The codes documented in this report are preliminary and upon coder review may  be revised to meet current compliance requirements. Toribio Fortune, MD Toribio Fortune,  04/07/2024 12:14:50 PM This report has been signed electronically. Number of Addenda: 0

## 2024-04-07 NOTE — Anesthesia Postprocedure Evaluation (Signed)
 Anesthesia Post Note  Patient: Casey Johnston  Procedure(s) Performed: COLONOSCOPY EGD (ESOPHAGOGASTRODUODENOSCOPY)  Patient location during evaluation: Phase II Anesthesia Type: General Level of consciousness: awake and alert Pain management: pain level controlled Vital Signs Assessment: post-procedure vital signs reviewed and stable Respiratory status: spontaneous breathing, nonlabored ventilation and respiratory function stable Cardiovascular status: stable Anesthetic complications: no   There were no known notable events for this encounter.   Last Vitals:  Vitals:   04/07/24 1215 04/07/24 1224  BP: (!) 119/45 (!) 133/48  Pulse: 99 81  Resp: 18 18  Temp:  36.8 C  SpO2: 97% 100%    Last Pain:  Vitals:   04/07/24 1224  TempSrc:   PainSc: 0-No pain                 Gabrielly Mccrystal L Able Malloy

## 2024-04-08 ENCOUNTER — Inpatient Hospital Stay (HOSPITAL_COMMUNITY)

## 2024-04-08 ENCOUNTER — Ambulatory Visit (INDEPENDENT_AMBULATORY_CARE_PROVIDER_SITE_OTHER): Payer: Self-pay | Admitting: Gastroenterology

## 2024-04-08 DIAGNOSIS — D62 Acute posthemorrhagic anemia: Secondary | ICD-10-CM | POA: Diagnosis not present

## 2024-04-08 LAB — BPAM RBC
Blood Product Expiration Date: 202512162359
Blood Product Expiration Date: 202512162359
Blood Product Expiration Date: 202512122359
Blood Product Unit Number: 202512122359
Blood Product Unit Number: 202512162359
ISSUE DATE / TIME: 202511172308
ISSUE DATE / TIME: 202511150843
ISSUE DATE / TIME: 202511151115
ISSUE DATE / TIME: 202511170212
ISSUE DATE / TIME: 202511180137
ISSUE DATE / TIME: 202512122359
ISSUE DATE / TIME: 202512162359
ISSUE DATE / TIME: 202512162359
ISSUING PHYSICIAN: 202511151115
ISSUING PHYSICIAN: 202511172308
ISSUING PHYSICIAN: 202512102359
PRODUCT CODE: 202511152225
PRODUCT CODE: 202511170432
PRODUCT CODE: 202511180137
PRODUCT CODE: 202512122359
PRODUCT CODE: 202512162359
Unit Type and Rh: 202512162359
Unit Type and Rh: 5100
Unit Type and Rh: 202511151851
Unit Type and Rh: 202511151851
Unit Type and Rh: 202512122359
Unit Type and Rh: 202512162359
Unit Type and Rh: 202512162359
Unit Type and Rh: 202512162359
Unit Type and Rh: 5100
Unit Type and Rh: 5100
Unit Type and Rh: 5100
Unit Type and Rh: 5100
Unit Type and Rh: 5100
Unit Type and Rh: 5100
Unit Type and Rh: 5100
Unit Type and Rh: 5100

## 2024-04-08 LAB — TYPE AND SCREEN
ABO/RH(D): O POS
Antibody Screen: NEGATIVE
Unit division: 0
Unit division: 0
Unit division: 0
Unit division: 0
Unit division: 0
Unit division: 0
Unit division: 0
Unit division: 0

## 2024-04-08 LAB — GLUCOSE, CAPILLARY
Glucose-Capillary: 157 mg/dL — ABNORMAL HIGH (ref 70–99)
Glucose-Capillary: 161 mg/dL — ABNORMAL HIGH (ref 70–99)
Glucose-Capillary: 154 mg/dL — ABNORMAL HIGH (ref 70–99)
Glucose-Capillary: 151 mg/dL — ABNORMAL HIGH (ref 70–99)

## 2024-04-08 LAB — BASIC METABOLIC PANEL WITH GFR
Anion gap: 3 — ABNORMAL LOW (ref 5–15)
BUN: 8 mg/dL (ref 8–23)
CO2: 28 mmol/L (ref 22–32)
Calcium: 7.8 mg/dL — ABNORMAL LOW (ref 8.9–10.3)
Chloride: 110 mmol/L (ref 98–111)
Creatinine, Ser: 0.73 mg/dL (ref 0.44–1.00)
GFR, Estimated: >60 mL/min (ref >60)
Glucose, Bld: 129 mg/dL — ABNORMAL HIGH (ref 70–99)
Potassium: 4.6 mmol/L (ref 3.5–5.1)
Sodium: 140 mmol/L (ref 135–145)

## 2024-04-08 LAB — PREPARE RBC (CROSSMATCH)

## 2024-04-08 LAB — HEMOGLOBIN AND HEMATOCRIT, BLOOD
HCT: 20.7 % — ABNORMAL LOW (ref 36.0–46.0)
HCT: 21 % — ABNORMAL LOW (ref 36.0–46.0)
HCT: 25.1 % — ABNORMAL LOW (ref 36.0–46.0)
Hemoglobin: 6.7 g/dL — CL (ref 12.0–15.0)
Hemoglobin: 6.8 g/dL — CL (ref 12.0–15.0)
Hemoglobin: 8.3 g/dL — ABNORMAL LOW (ref 12.0–15.0)

## 2024-04-08 LAB — SURGICAL PATHOLOGY

## 2024-04-08 MED ORDER — SODIUM CHLORIDE 0.9% IV SOLUTION: Freq: Once | INTRAVENOUS | Status: AC

## 2024-04-08 MED ORDER — IOHEXOL 350 MG/ML SOLN
100.0000 mL | Freq: Once | INTRAVENOUS | Status: AC | PRN
  Administered 2024-04-08: 100 mL via INTRAVENOUS

## 2024-04-08 MED ORDER — LACTATED RINGERS IV BOLUS
1000.0000 mL | Freq: Once | INTRAVENOUS | Status: AC
  Administered 2024-04-08: 1000 mL via INTRAVENOUS

## 2024-04-08 NOTE — Plan of Care (Signed)
  Problem: Education: Goal: Knowledge of General Education information will improve Description: Including pain rating scale, medication(s)/side effects and non-pharmacologic comfort measures Outcome: Progressing   Problem: Health Behavior/Discharge Planning: Goal: Ability to manage health-related needs will improve Outcome: Progressing   Problem: Clinical Measurements: Goal: Respiratory complications will improve Outcome: Progressing Goal: Cardiovascular complication will be avoided Outcome: Progressing   Problem: Activity: Goal: Risk for activity intolerance will decrease Outcome: Progressing   Problem: Nutrition: Goal: Adequate nutrition will be maintained Outcome: Progressing   Problem: Elimination: Goal: Will not experience complications related to urinary retention Outcome: Progressing

## 2024-04-08 NOTE — Plan of Care (Signed)

## 2024-04-08 NOTE — Progress Notes (Signed)
 PROGRESS NOTE    Casey Johnston  FMW:[id number]  DOB: 1961/04/08  DOA: 04/04/2024 PCP: Teresa Jenkins Jansky, FNP Outpatient Specialists:   Hospital course:  63 year old female with a history of hypertension, diabetes mellitus type 2, hyperlipidemia, GERD, IBS-C, and anxiety presenting with hematochezia and dark stools.  Patient stated that she first noticed some hematochezia on 04/01/2024.  Subsequently on the evening of 04/03/2024 she noted some dark and black stools mixed in with hematochezia.  She began feeling lightheaded and dizzy.  She felt like she had a fluttering sensation in her chest.  She denied any visual loss, focal extremity weakness.  She denies chest pain, shortness breath, nausea, vomiting.  She has had loose stools.  She denies any abdominal pain.  There is no dysuria or hematuria.  She does not take any blood thinners.  She denies any NSAIDs.   In the ED, the patient was afebrile hemodynamically stable with oxygen saturation 98% on room air.  WBC 15.0, hemoglobin 7.3, platelet 272.  Sodium 140, potassium 4.4, bicarbonate 23, serum creatinine 1.09.  LFTs unremarkable.  Lactic acid was 2.9.  Troponin 16.  EKG showed sinus rhythm, nonspecific ST changes.  GI was consulted assist with management.   Patient underwent EGD on 04/05/2024 with results discussed below.  She was continued on pantoprazole  twice daily.  She has been transfused 4 units PRBC total for the admission.  At the fair to the medical floor, the patient had another episode of rectal bleeding.  Hemoglobin dropped back down from 8.1-5.6.  She was moved back down to the stepdown unit.  GI was reconsulted.   Subjective:  Patient feels she is doing better, no further bowel movements Still feels weak but no chest pain or shortness of breath   Objective: Vitals:   04/08/24 1206 04/08/24 1300 04/08/24 1400 04/08/24 1407  BP: (!) 137/44 (!) 109/34 (!) 98/45 (!) 98/45  Pulse: 78 80 79 84  Resp: 17 16 14 19   Temp:  99 F (37.2 C)   99 F (37.2 C)  TempSrc:    Oral  SpO2: 98% 98% 93% 94%  Weight:      Height:        Intake/Output Summary (Last 24 hours) at 04/08/2024 1654 Last data filed at 04/08/2024 1400 Gross per 24 hour  Intake 498 ml  Output --  Net 498 ml   Filed Weights   04/04/24 0604 04/04/24 0850 04/05/24 1400  Weight: 88 kg 90.3 kg 90.2 kg     Exam:  General: Right appearing female lying in bed on her side in NAD Eyes: sclera anicteric, conjuctiva mild injection bilaterally CVS: S1-S2, regular  Respiratory:  decreased air entry bilaterally secondary to decreased inspiratory effort, rales at bases  GI: NABS, soft, NT  LE: Warm and well-perfused Neuro: A/O x 3,  grossly nonfocal.  Psych: patient is logical and coherent, judgement and insight appear normal, mood and affect appropriate to situation.  Data Reviewed:  Basic Metabolic Panel: Recent Labs  Lab 04/04/24 0621 04/05/24 0349 04/06/24 0044 04/08/24 0522  NA 140 138 137 140  K 4.4 4.2 5.0 4.6  CL 104 107 106 110  CO2 23 23 22 28   GLUCOSE 197* 130* 257* 129*  BUN 44* 36* 40* 8  CREATININE 1.09* 0.93 1.07* 0.73  CALCIUM  8.4* 7.6* 7.6* 7.8*  MG  --  1.6* 2.0  --     CBC: Recent Labs  Lab 04/04/24 0621 04/04/24 1731 04/05/24 0349 04/05/24 1155 04/06/24  9955 04/06/24 1015 04/06/24 2235 04/07/24 0520 04/07/24 1646 04/08/24 0522 04/08/24 0614 04/08/24 1609  WBC 15.0*  --  15.8*  --  21.4* 23.1*  --   --   --   --   --   --   NEUTROABS  --   --   --   --  16.4*  --   --   --   --   --   --   --   HGB 7.3*   < > 8.1*   < > 5.6* 8.0*   < > 8.5* 7.1* 6.7* 6.8* 8.3*  HCT 23.4*   < > 24.1*   < > 16.7* 23.4*   < > 24.8* 21.4* 20.7* 21.0* 25.1*  MCV 89.3  --  87.0  --  90.3 87.0  --   --   --   --   --   --   PLT 272  --  140*  --  158 128*  --   --   --   --   --   --    < > = values in this interval not displayed.     Scheduled Meds:  sodium chloride    Intravenous Once   atorvastatin   20 mg Oral  Daily   Chlorhexidine Gluconate Cloth  6 each Topical Q0600   insulin  aspart  0-15 Units Subcutaneous TID WC   insulin  aspart  0-5 Units Subcutaneous QHS   pantoprazole  (PROTONIX ) IV  40 mg Intravenous Q12H   Continuous Infusions:  lactated ringers  Stopped (04/06/24 2315)     Assessment & Plan:   ABLA Patient's hemoglobin has been stable for 48 hours at 6.8 Will transfuse 1 more unit PRBC and follow H&H to ensure stability Patient is required 8 units transfusion this admission Continue twice daily PPI IV Presented with hemoglobin 7.3 - Previous baseline hemoglobin~12 - GI consult appreciated - Transfused 8 unit PRBC this admission - clear liquid diet - Pantoprazole  twice daily - 11/16 EGD--4 angioectaasia w/o bleed antrum; 3 angioectasia with recent bleed duodenal bulb, 2 in duodenum---all treated with APC - 11/16 evening--rectal bleed; Hgb dropped from 8.1>>5.6 - 11/17 evening--rectal bleed, Hgb dropped 8.0>>5.6 ; had syncope -Transfused 2 additional units PRBC for total of 8 during this admission - 11/18 --colonoscopy--Blood in the entire examined colon; polyps in transverse and descending colon; nonbleeding internal hemorrhoids - 11/18 --enteroscopy non-bleeding gastric and duodenal ulcer -monitor H/H q 12 hours>>CTA GI bleed scan if rebleeds 11/19--H&H stable at 6.9, hemoglobin increased appropriately to 8.3 after 1 unit PRBC  HTN Clonidine  is being held, patient is no longer taking metoprolol  at home Blood pressures are running low after transfusion, will hydrate with LR bolus and follow   DM2 Blood sugars under good control on present regimen  Leukocytosis Thought to be secondary leukemoid reaction Will repeat in a.m.   DVT prophylaxis: SCD Code Status: Full Family Communication: None today     Studies: CT ANGIO GI BLEED Result Date: 04/08/2024 EXAM: CTA ABDOMEN AND PELVIS WITH CONTRAST 04/08/2024 11:42:52 AM TECHNIQUE: CTA images of the abdomen and pelvis  with intravenous contrast. 100 mL (iohexol  (OMNIPAQUE ) 350 MG/ML injection 100 mL IOHEXOL  350 MG/ML SOLN). Three-dimensional MIP/volume rendered formations were performed. Automated exposure control, iterative reconstruction, and/or weight based adjustment of the mA/kV was utilized to reduce the radiation dose to as low as reasonably achievable. COMPARISON: None available. CLINICAL HISTORY: hypotension, symptomatic anemia with persistent decline in hemoglobin FINDINGS: VASCULATURE: GI BLEED: No evidence of  active gastrointestinal bleeding within the stomach, small bowel, or colon. AORTA: No acute finding. No abdominal aortic aneurysm. No dissection. CELIAC TRUNK: No acute finding. No occlusion or significant stenosis. SUPERIOR MESENTERIC ARTERY: No acute finding. No occlusion or significant stenosis. INFERIOR MESENTERIC ARTERY: No acute finding. No occlusion or significant stenosis. RENAL ARTERIES: No acute finding. No occlusion or significant stenosis. ILIAC ARTERIES: No acute finding. No occlusion or significant stenosis. ABDOMEN/PELVIS: LOWER CHEST: Bibasilar interstitial thickening and mild peripheral consolidation potential pulmonary infection or aspiration pneumonitis. LIVER: The liver is unremarkable. GALLBLADDER AND BILE DUCTS: Gallbladder is unremarkable. No biliary ductal dilatation. SPLEEN: The spleen is unremarkable. PANCREAS: The pancreas is unremarkable. ADRENAL GLANDS: Bilateral adrenal glands demonstrate no acute abnormality. KIDNEYS, URETERS AND BLADDER: Adjacent nonenhancing simple fluid attenuation cyst of the right kidney measured 3.6 x 2.4 cm. No follow-up recommended for benign renal lesion . No stones in the kidneys or ureters. No hydronephrosis. No perinephric or periureteral stranding. Urinary bladder is unremarkable. GI AND BOWEL: Stomach and duodenal sweep demonstrate no acute abnormality. Several diverticula of the left colon without acute inflammation. There is no bowel obstruction. No  abnormal bowel wall thickening or distension. REPRODUCTIVE: Post hysterectomy. PERITONEUM AND RETROPERITONEUM: No ascites or free air. LYMPH NODES: No lymphadenopathy. BONES AND SOFT TISSUES: No acute abnormality of the bones. No acute soft tissue abnormality. IMPRESSION: 1. No evidence of active gastrointestinal bleeding. 2. Bibasilar interstitial thickening with mild peripheral consolidation, concerning for pulmonary infection versus aspiration pneumonitis. Electronically signed by: Norleen Boxer MD 04/08/2024 12:13 PM EST RP Workstation: [id number]    Principal Problem:   Acute blood loss anemia Active Problems:   Mixed hyperlipidemia   Uncontrolled type 2 diabetes mellitus with hyperglycemia (HCC)   Obesity (BMI 30-39.9)   Symptomatic anemia   Angiectasia of gastrointestinal tract     Meshilem Machuca Vangie Pike, [facility]  If 7PM-7AM, please contact night-coverage [email or web address]   LOS: 3 days

## 2024-04-08 NOTE — Progress Notes (Addendum)
 Subjective: Feeling somewhat fatigued this am. No BMs today. Denies abdominal pain, nausea, vomiting.   Objective: Vital signs in last 24 hours: Temp:  [97.7 F (36.5 C)-99.3 F (37.4 C)] 98.5 F (36.9 C) (11/19 0739) Pulse Rate:  [71-99] 76 (11/19 0600) Resp:  [10-20] 12 (11/19 0600) BP: (91-143)/(23-48) 132/36 (11/19 0600) SpO2:  [95 %-100 %] 100 % (11/19 0600) Last BM Date : 04/07/24 General:   Alert and oriented, pleasant Head:  Normocephalic and atraumatic. Eyes:  No icterus, sclera clear. Conjuctiva pink.  Mouth:  Without lesions, mucosa pink and moist.  Heart:  S1, S2 present, no murmurs noted.  Lungs: Clear to auscultation bilaterally, without wheezing, rales, or rhonchi.  Abdomen:  Bowel sounds present, soft, non-tender, non-distended. No HSM or hernias noted. No rebound or guarding. No masses appreciated  Neurologic:  Alert and  oriented x4;  grossly normal neurologically. Skin:  Warm and dry, intact without significant lesions.  Psych:  Alert and cooperative. Normal mood and affect.  Intake/Output from previous day: 11/18 0701 - 11/19 0700 In: 77.2 [I.V.:77.2] Out: -  Intake/Output this shift: Total I/O In: 240 [P.O.:240] Out: -   Lab Results: Recent Labs    04/06/24 0044 04/06/24 1015 04/06/24 2235 04/07/24 1646 04/08/24 0522 04/08/24 0614  WBC 21.4* 23.1*  --   --   --   --   HGB 5.6* 8.0*   < > 7.1* 6.7* 6.8*  HCT 16.7* 23.4*   < > 21.4* 20.7* 21.0*  PLT 158 128*  --   --   --   --    < > = values in this interval not displayed.   BMET Recent Labs    04/06/24 0044 04/08/24 0522  NA 137 140  K 5.0 4.6  CL 106 110  CO2 22 28  GLUCOSE 257* 129*  BUN 40* 8  CREATININE 1.07* 0.73  CALCIUM  7.6* 7.8*    Assessment: 63 y.o. female with a history of HTN, diabetes, HLD, OSA, GERD, IBS-C, presenting this admission with acute blood loss anemia with melena and hematochezia, on admission Hgb 7.3, GI consulted for evaluation.   Acute blood loss  anemia: -Hgb 7.3 on admission, down as low as 5.6 -8 units PRBCs thus far with last unit the morning of 11/18 -hgb this morning down to 6.7 -BP range this AM 127/25-143/37, HR in the 70s -bidirectional endoscopies done yesterday: EGD: - Non-bleeding gastric ulcers. These were post ablation ulcers. - Non-bleeding duodenal ulcer with a clean ulcer base (Forrest Class III). This was post ablation ulcer. Colonoscopy:  - Blood in the entire examined colon.  - Two 2 to 4 mm polyps in the transverse colon,  - One 6 mm polyp in the descending colon - Diverticulosis in the sigmoid colon and in the  ascending colon. - Non-bleeding internal hemorrhoids.  Recommended trend h&h q12 hours, if overt bleeding with drop in hgb/hemodynamic instability, perform CT angio bleed protocol Stat, consult IR if active bleeding, otherwise may need capsule endoscopy while inpatient.  Notably patient denies any rectal bleeding or melena today. Feeling fatigued. BP down since last night, with range of 91/143 systolic and 23-48 diastolic, hgb down to 6.7. given hemodynamic instability, there is certainly concern for ongoing bleeding. Will order CT angio GI bleed scan STAT for further evaluation. Discussed plan with the patient and her nurse, patient requesting food but will hold off on feeding patient for now.    Plan: Trend h&h Q12h Transfuse for hgb <7  Agree  with transfusion of 1 unit PRBCs this morning  Monitor for overt GI bleeding CT angio GI bleed scan stat  NPO status  for now    LOS: 3 days    04/08/2024, 9:03 AM   [name] L. Mariette, MSN, APRN, AGNP-C Adult-Gerontology Nurse Practitioner [facility]  CT angio GI bleed scan negative for active bleed. Given continued drop in hemoglobin, may benefit from givens capsule study this admission to further evaluate for small bowel source of bleeding. Will allow soft diet today, NPO midnight. Reassess hemoglobin again in the  morning, if continued down trend in Hemoglobin or recurrence of GI bleeding, will consider dropping givens capsule tomorrow.

## 2024-04-09 ENCOUNTER — Other Ambulatory Visit (HOSPITAL_BASED_OUTPATIENT_CLINIC_OR_DEPARTMENT_OTHER): Payer: Self-pay

## 2024-04-09 ENCOUNTER — Encounter (HOSPITAL_COMMUNITY)
Admission: EM | Disposition: A | Discharge status: Home / Self Care | Payer: Self-pay | Source: Home / Self Care | Attending: Internal Medicine

## 2024-04-09 DIAGNOSIS — K5730 Diverticulosis of large intestine without perforation or abscess without bleeding: Secondary | ICD-10-CM

## 2024-04-09 DIAGNOSIS — K31819 Angiodysplasia of stomach and duodenum without bleeding: Secondary | ICD-10-CM | POA: Diagnosis not present

## 2024-04-09 DIAGNOSIS — D649 Anemia, unspecified: Secondary | ICD-10-CM | POA: Diagnosis not present

## 2024-04-09 DIAGNOSIS — D62 Acute posthemorrhagic anemia: Secondary | ICD-10-CM | POA: Diagnosis not present

## 2024-04-09 HISTORY — PX: GIVENS CAPSULE STUDY: SHX5432

## 2024-04-09 LAB — CBC
HCT: 25.5 % — ABNORMAL LOW (ref 36.0–46.0)
Hemoglobin: 8.2 g/dL — ABNORMAL LOW (ref 12.0–15.0)
MCH: 29.2 pg (ref 26.0–34.0)
MCHC: 32.2 g/dL (ref 30.0–36.0)
MCV: 90.7 fL (ref 80.0–100.0)
Platelets: 154 K/uL (ref 150–400)
RBC: 2.81 MIL/uL — ABNORMAL LOW (ref 3.87–5.11)
RDW: 14.8 % (ref 11.5–15.5)
WBC: 13.4 K/uL — ABNORMAL HIGH (ref 4.0–10.5)
nRBC: 0.1 % (ref 0.0–0.2)

## 2024-04-09 LAB — CULTURE, BLOOD (ROUTINE X 2)
Culture: NO GROWTH
Culture: NO GROWTH
Special Requests: ADEQUATE
Special Requests: ADEQUATE

## 2024-04-09 LAB — BASIC METABOLIC PANEL WITH GFR
Anion gap: 5 (ref 5–15)
BUN: <5 mg/dL — ABNORMAL LOW (ref 8–23)
CO2: 28 mmol/L (ref 22–32)
Calcium: 8.4 mg/dL — ABNORMAL LOW (ref 8.9–10.3)
Chloride: 109 mmol/L (ref 98–111)
Creatinine, Ser: 0.73 mg/dL (ref 0.44–1.00)
GFR, Estimated: >60 mL/min (ref >60)
Glucose, Bld: 118 mg/dL — ABNORMAL HIGH (ref 70–99)
Potassium: 4.3 mmol/L (ref 3.5–5.1)
Sodium: 142 mmol/L (ref 135–145)

## 2024-04-09 LAB — TYPE AND SCREEN
ABO/RH(D): O POS
Antibody Screen: NEGATIVE
Unit division: 0

## 2024-04-09 LAB — GLUCOSE, CAPILLARY
Glucose-Capillary: 130 mg/dL — ABNORMAL HIGH (ref 70–99)
Glucose-Capillary: 134 mg/dL — ABNORMAL HIGH (ref 70–99)
Glucose-Capillary: 112 mg/dL — ABNORMAL HIGH (ref 70–99)
Glucose-Capillary: 140 mg/dL — ABNORMAL HIGH (ref 70–99)

## 2024-04-09 LAB — BPAM RBC
Blood Product Expiration Date: 202512212359
ISSUE DATE / TIME: 202511191145
Unit Type and Rh: 5100

## 2024-04-09 MED ORDER — LOSARTAN POTASSIUM 50 MG PO TABS
50.0000 mg | ORAL_TABLET | Freq: Every day | ORAL | Status: DC
  Administered 2024-04-09 – 2024-04-12 (×4): 50 mg via ORAL
  Filled 2024-04-09 (×4): qty 1

## 2024-04-09 NOTE — Progress Notes (Signed)
 PROGRESS NOTE    Casey Johnston  FMW:[id number]  DOB: 08-15-1960  DOA: 04/04/2024 PCP: Teresa Jenkins Jansky, [facility] course:  63 year old female with a history of hypertension, diabetes mellitus type 2, hyperlipidemia, GERD, IBS-C, and anxiety presenting with hematochezia and dark stools.  Patient stated that she first noticed some hematochezia on 04/01/2024.  Subsequently on the evening of 04/03/2024 she noted some dark and black stools mixed in with hematochezia.  She began feeling lightheaded and dizzy.  She felt like she had a fluttering sensation in her chest.  She denied any visual loss, focal extremity weakness.  She denies chest pain, shortness breath, nausea, vomiting.  She has had loose stools.  She denies any abdominal pain.  There is no dysuria or hematuria.  She does not take any blood thinners.  She denies any NSAIDs.    Patient underwent EGD on 04/05/2024.  She was continued on pantoprazole  twice daily.  She has been transfused 9 units PRBC total for the admission.      Subjective:  Got VCE this AM No further bleeding   Objective: Vitals:   04/09/24 0000 04/09/24 0400 04/09/24 0728 04/09/24 0800  BP: (!) 135/34 (!) 154/44  138/84  Pulse: 71 69    Resp: 15 13    Temp: 98.1 F (36.7 C) 98.2 F (36.8 C) 98.3 F (36.8 C)   TempSrc: Axillary Oral Oral   SpO2: 100% 100%    Weight:      Height:        Intake/Output Summary (Last 24 hours) at 04/09/2024 9161 Last data filed at 04/08/2024 1748 Gross per 24 hour  Intake 337.78 ml  Output --  Net 337.78 ml   Filed Weights   04/04/24 0604 04/04/24 0850 04/05/24 1400  Weight: 88 kg 90.3 kg 90.2 kg     Exam:  General: Appearance:    Obese female in no acute distress     Lungs:    respirations unlabored  Heart:    Normal heart rate.  MS:   All extremities are intact.   Neurologic:   Awake, alert     Data Reviewed:  Basic Metabolic Panel: Recent Labs  Lab 04/04/24 0621 04/05/24 0349  04/06/24 0044 04/08/24 0522 04/09/24 0501  NA 140 138 137 140 142  K 4.4 4.2 5.0 4.6 4.3  CL 104 107 106 110 109  CO2 23 23 22 28 28   GLUCOSE 197* 130* 257* 129* 118*  BUN 44* 36* 40* 8 <5*  CREATININE 1.09* 0.93 1.07* 0.73 0.73  CALCIUM  8.4* 7.6* 7.6* 7.8* 8.4*  MG  --  1.6* 2.0  --   --     CBC: Recent Labs  Lab 04/04/24 0621 04/04/24 1731 04/05/24 0349 04/05/24 1155 04/06/24 0044 04/06/24 1015 04/06/24 2235 04/07/24 1646 04/08/24 0522 04/08/24 0614 04/08/24 1609 04/09/24 0501  WBC 15.0*  --  15.8*  --  21.4* 23.1*  --   --   --   --   --  13.4*  NEUTROABS  --   --   --   --  16.4*  --   --   --   --   --   --   --   HGB 7.3*   < > 8.1*   < > 5.6* 8.0*   < > 7.1* 6.7* 6.8* 8.3* 8.2*  HCT 23.4*   < > 24.1*   < > 16.7* 23.4*   < > 21.4* 20.7* 21.0* 25.1*  25.5*  MCV 89.3  --  87.0  --  90.3 87.0  --   --   --   --   --  90.7  PLT 272  --  140*  --  158 128*  --   --   --   --   --  154   < > = values in this interval not displayed.     Scheduled Meds:  sodium chloride    Intravenous Once   atorvastatin   20 mg Oral Daily   Chlorhexidine Gluconate Cloth  6 each Topical Q0600   insulin  aspart  0-15 Units Subcutaneous TID WC   insulin  aspart  0-5 Units Subcutaneous QHS   pantoprazole  (PROTONIX ) IV  40 mg Intravenous Q12H   Continuous Infusions:  lactated ringers  Stopped (04/06/24 2315)     Assessment & Plan:   ABLA -hgb stable s/p 9 units PRBCs - PPI  - GI consult appreciated - 11/16 EGD: 4 angioectaasia w/o bleed antrum; 3 angioectasia with recent bleed duodenal bulb, 2 in duodenum---all treated with APC - 11/16 evening--rectal bleed; Hgb dropped from 8.1>>5.6 - 11/17 evening--rectal bleed, Hgb dropped 8.0>>5.6 ; had syncope - 11/18 --colonoscopy--Blood in the entire examined colon; polyps in transverse and descending colon; nonbleeding internal hemorrhoids - 11/18 --enteroscopy non-bleeding gastric and duodenal ulcer -monitor H/H q 12 hours>>CTA GI bleed  scan if rebleeds 11/20-- VCE  HTN Clonidine  is being held, patient is no longer taking metoprolol  at home -no current need for BP medications  DM2 -SSI  Leukocytosis -trended down   OOB/ambulating -can transfer to floor at end of the day if BP stable and no further bleeding, home in AM?   DVT prophylaxis: SCD Code Status: Full Family Communication: None today     Studies: CT ANGIO GI BLEED Result Date: 04/08/2024 EXAM: CTA ABDOMEN AND PELVIS WITH CONTRAST 04/08/2024 11:42:52 AM TECHNIQUE: CTA images of the abdomen and pelvis with intravenous contrast. 100 mL (iohexol  (OMNIPAQUE ) 350 MG/ML injection 100 mL IOHEXOL  350 MG/ML SOLN). Three-dimensional MIP/volume rendered formations were performed. Automated exposure control, iterative reconstruction, and/or weight based adjustment of the mA/kV was utilized to reduce the radiation dose to as low as reasonably achievable. COMPARISON: None available. CLINICAL HISTORY: hypotension, symptomatic anemia with persistent decline in hemoglobin FINDINGS: VASCULATURE: GI BLEED: No evidence of active gastrointestinal bleeding within the stomach, small bowel, or colon. AORTA: No acute finding. No abdominal aortic aneurysm. No dissection. CELIAC TRUNK: No acute finding. No occlusion or significant stenosis. SUPERIOR MESENTERIC ARTERY: No acute finding. No occlusion or significant stenosis. INFERIOR MESENTERIC ARTERY: No acute finding. No occlusion or significant stenosis. RENAL ARTERIES: No acute finding. No occlusion or significant stenosis. ILIAC ARTERIES: No acute finding. No occlusion or significant stenosis. ABDOMEN/PELVIS: LOWER CHEST: Bibasilar interstitial thickening and mild peripheral consolidation potential pulmonary infection or aspiration pneumonitis. LIVER: The liver is unremarkable. GALLBLADDER AND BILE DUCTS: Gallbladder is unremarkable. No biliary ductal dilatation. SPLEEN: The spleen is unremarkable. PANCREAS: The pancreas is unremarkable.  ADRENAL GLANDS: Bilateral adrenal glands demonstrate no acute abnormality. KIDNEYS, URETERS AND BLADDER: Adjacent nonenhancing simple fluid attenuation cyst of the right kidney measured 3.6 x 2.4 cm. No follow-up recommended for benign renal lesion . No stones in the kidneys or ureters. No hydronephrosis. No perinephric or periureteral stranding. Urinary bladder is unremarkable. GI AND BOWEL: Stomach and duodenal sweep demonstrate no acute abnormality. Several diverticula of the left colon without acute inflammation. There is no bowel obstruction. No abnormal bowel wall thickening or distension. REPRODUCTIVE: Post hysterectomy.  PERITONEUM AND RETROPERITONEUM: No ascites or free air. LYMPH NODES: No lymphadenopathy. BONES AND SOFT TISSUES: No acute abnormality of the bones. No acute soft tissue abnormality. IMPRESSION: 1. No evidence of active gastrointestinal bleeding. 2. Bibasilar interstitial thickening with mild peripheral consolidation, concerning for pulmonary infection versus aspiration pneumonitis. Electronically signed by: Norleen Boxer MD 04/08/2024 12:13 PM EST RP Workstation: [id number]    Principal Problem:   Acute blood loss anemia Active Problems:   Mixed hyperlipidemia   Uncontrolled type 2 diabetes mellitus with hyperglycemia (HCC)   Obesity (BMI 30-39.9)   Symptomatic anemia   Angiectasia of gastrointestinal tract     Harlene RAYMOND Bowl [facility]  If 7PM-7AM, please contact night-coverage [email or web address]   LOS: 4 days

## 2024-04-09 NOTE — Progress Notes (Signed)
 Capsule study started on patient at bedside. Order for clear liquid diet placed. Camera to be taken off patient at 4pm.

## 2024-04-09 NOTE — Plan of Care (Signed)

## 2024-04-09 NOTE — Progress Notes (Signed)
 Pt arrived via WC from ICU. Ambulatory from [facility] to bed without assistance, gait steady. Oriented to room and safety precautions, states understanding. Call bell in reach, advised to call for needs.

## 2024-04-09 NOTE — Progress Notes (Signed)
 Gastroenterology Progress Note   Referring Provider: No ref. provider found Primary Care Physician:  Teresa Jenkins Jansky, FNP Primary Gastroenterologist:  Carlin POUR. Cindie, DO Patient ID: Casey Johnston; [id number]; 1960-09-11   Subjective:    No complaints. Swallowed capsule camera this morning. Has clear liquid tray. Per nursing, advised by endo that clear liquids were ok at 8am.   Objective:   Vital signs in last 24 hours: Temp:  [98.1 F (36.7 C)-98.5 F (36.9 C)] 98.3 F (36.8 C) (11/20 0728) Pulse Rate:  [69-81] 72 (11/20 1500) Resp:  [13-17] 13 (11/20 0400) BP: (135-178)/(34-84) 167/56 (11/20 1500) SpO2:  [98 %-100 %] 100 % (11/20 1500) Last BM Date : 04/07/24 General:   Alert,  Well-developed, well-nourished, pleasant and cooperative in NAD Head:  Normocephalic and atraumatic. Eyes:  Sclera clear, no icterus.   Abdomen:  Soft, nontender and nondistended. No masses, hepatosplenomegaly or hernias noted. Normal bowel sounds, without guarding, and without rebound.   Extremities:  Without clubbing, deformity or edema. Neurologic:  Alert and  oriented x4;  grossly normal neurologically. Skin:  Intact without significant lesions or rashes. Psych:  Alert and cooperative. Normal mood and affect.  Intake/Output from previous day: 11/19 0701 - 11/20 0700 In: 577.8 [P.O.:240; Blood:258; IV Piggyback:79.8] Out: -  Intake/Output this shift: No intake/output data recorded.  Lab Results: CBC Recent Labs    04/08/24 0614 04/08/24 1609 04/09/24 0501  WBC  --   --  13.4*  HGB 6.8* 8.3* 8.2*  HCT 21.0* 25.1* 25.5*  MCV  --   --  90.7  PLT  --   --  154   BMET Recent Labs    04/08/24 0522 04/09/24 0501  NA 140 142  K 4.6 4.3  CL 110 109  CO2 28 28  GLUCOSE 129* 118*  BUN 8 <5*  CREATININE 0.73 0.73  CALCIUM  7.8* 8.4*   LFTs No results for input(s): BILITOT, BILIDIR, IBILI, ALKPHOS, AST, ALT, PROT, ALBUMIN in the last 72 hours. No results for  input(s): LIPASE in the last 72 hours. PT/INR No results for input(s): LABPROT, INR in the last 72 hours.       Imaging Studies: CT ANGIO GI BLEED Result Date: 04/08/2024 EXAM: CTA ABDOMEN AND PELVIS WITH CONTRAST 04/08/2024 11:42:52 AM TECHNIQUE: CTA images of the abdomen and pelvis with intravenous contrast. 100 mL (iohexol  (OMNIPAQUE ) 350 MG/ML injection 100 mL IOHEXOL  350 MG/ML SOLN). Three-dimensional MIP/volume rendered formations were performed. Automated exposure control, iterative reconstruction, and/or weight based adjustment of the mA/kV was utilized to reduce the radiation dose to as low as reasonably achievable. COMPARISON: None available. CLINICAL HISTORY: hypotension, symptomatic anemia with persistent decline in hemoglobin FINDINGS: VASCULATURE: GI BLEED: No evidence of active gastrointestinal bleeding within the stomach, small bowel, or colon. AORTA: No acute finding. No abdominal aortic aneurysm. No dissection. CELIAC TRUNK: No acute finding. No occlusion or significant stenosis. SUPERIOR MESENTERIC ARTERY: No acute finding. No occlusion or significant stenosis. INFERIOR MESENTERIC ARTERY: No acute finding. No occlusion or significant stenosis. RENAL ARTERIES: No acute finding. No occlusion or significant stenosis. ILIAC ARTERIES: No acute finding. No occlusion or significant stenosis. ABDOMEN/PELVIS: LOWER CHEST: Bibasilar interstitial thickening and mild peripheral consolidation potential pulmonary infection or aspiration pneumonitis. LIVER: The liver is unremarkable. GALLBLADDER AND BILE DUCTS: Gallbladder is unremarkable. No biliary ductal dilatation. SPLEEN: The spleen is unremarkable. PANCREAS: The pancreas is unremarkable. ADRENAL GLANDS: Bilateral adrenal glands demonstrate no acute abnormality. KIDNEYS, URETERS AND BLADDER: Adjacent nonenhancing simple fluid attenuation  cyst of the right kidney measured 3.6 x 2.4 cm. No follow-up recommended for benign renal lesion . No  stones in the kidneys or ureters. No hydronephrosis. No perinephric or periureteral stranding. Urinary bladder is unremarkable. GI AND BOWEL: Stomach and duodenal sweep demonstrate no acute abnormality. Several diverticula of the left colon without acute inflammation. There is no bowel obstruction. No abnormal bowel wall thickening or distension. REPRODUCTIVE: Post hysterectomy. PERITONEUM AND RETROPERITONEUM: No ascites or free air. LYMPH NODES: No lymphadenopathy. BONES AND SOFT TISSUES: No acute abnormality of the bones. No acute soft tissue abnormality. IMPRESSION: 1. No evidence of active gastrointestinal bleeding. 2. Bibasilar interstitial thickening with mild peripheral consolidation, concerning for pulmonary infection versus aspiration pneumonitis. Electronically signed by: Norleen Boxer MD 04/08/2024 12:13 PM EST RP Workstation: [id number]  [7 weeks]  Assessment:   63 y.o. female with a history of HTN, diabetes, HLD, OSA, GERD, IBS-C, presenting this admission with acute blood loss anemia with melena and hematochezia, on admission Hgb 7.3, GI consulted for evaluation.    Acute blood loss anemia/melena and hematochezia  -Hgb 7.3 on admission, down as low as 5.6 -9 units PRBCs thus far with last unit 11/19 afternoon -hgb 8.2 today  -no stools in over 24 hours -small bowel capsule endoscopy swallowed this morning  EGD 11/16: -four non-bleeding angioectasias in stomach s/p APC -three recently bleeding angioectasias in duodenum s/p APC EGD 11/18: - Non-bleeding gastric ulcers. These were post ablation ulcers. - Non-bleeding duodenal ulcer with a clean ulcer base (Forrest Class III). This was post ablation ulcer. Colonoscopy 11/18:  - Blood in the entire examined colon.  -Two 2 to 4 mm polyps in the transverse colon,  - One 6 mm polyp in the descending colon - Diverticulosis in the sigmoid colon and in the  ascending colon. - Non-bleeding internal hemorrhoids. -tubular adenoma (1),  hyperplastic polysp   CTA bleeding scan 11/19 -no active gastrointestinal bleeding       Plan:   Will advance diet after capsule study Continue PPI BID Continue to monitor for further bleeding Capsule study will be read tomorrow   LOS: 4 days   Sonny RAMAN. Ezzard RIGGERS [facility] [phone] 11/20/20254:25 PM

## 2024-04-10 ENCOUNTER — Encounter (HOSPITAL_COMMUNITY): Payer: Self-pay | Admitting: Gastroenterology

## 2024-04-10 DIAGNOSIS — K259 Gastric ulcer, unspecified as acute or chronic, without hemorrhage or perforation: Secondary | ICD-10-CM | POA: Diagnosis not present

## 2024-04-10 DIAGNOSIS — K31819 Angiodysplasia of stomach and duodenum without bleeding: Secondary | ICD-10-CM | POA: Diagnosis not present

## 2024-04-10 DIAGNOSIS — D509 Iron deficiency anemia, unspecified: Secondary | ICD-10-CM

## 2024-04-10 DIAGNOSIS — K921 Melena: Secondary | ICD-10-CM | POA: Diagnosis not present

## 2024-04-10 LAB — GLUCOSE, CAPILLARY
Glucose-Capillary: 134 mg/dL — ABNORMAL HIGH (ref 70–99)
Glucose-Capillary: 158 mg/dL — ABNORMAL HIGH (ref 70–99)
Glucose-Capillary: 119 mg/dL — ABNORMAL HIGH (ref 70–99)
Glucose-Capillary: 175 mg/dL — ABNORMAL HIGH (ref 70–99)

## 2024-04-10 LAB — BASIC METABOLIC PANEL WITH GFR
Anion gap: 6 (ref 5–15)
BUN: <5 mg/dL — ABNORMAL LOW (ref 8–23)
CO2: 29 mmol/L (ref 22–32)
Calcium: 8.7 mg/dL — ABNORMAL LOW (ref 8.9–10.3)
Chloride: 106 mmol/L (ref 98–111)
Creatinine, Ser: 0.78 mg/dL (ref 0.44–1.00)
GFR, Estimated: >60 mL/min (ref >60)
Glucose, Bld: 123 mg/dL — ABNORMAL HIGH (ref 70–99)
Potassium: 4.1 mmol/L (ref 3.5–5.1)
Sodium: 141 mmol/L (ref 135–145)

## 2024-04-10 LAB — CBC
HCT: 26.3 % — ABNORMAL LOW (ref 36.0–46.0)
Hemoglobin: 8.4 g/dL — ABNORMAL LOW (ref 12.0–15.0)
MCH: 29 pg (ref 26.0–34.0)
MCHC: 31.9 g/dL (ref 30.0–36.0)
MCV: 90.7 fL (ref 80.0–100.0)
Platelets: 213 K/uL (ref 150–400)
RBC: 2.9 MIL/uL — ABNORMAL LOW (ref 3.87–5.11)
RDW: 14.8 % (ref 11.5–15.5)
WBC: 12.1 K/uL — ABNORMAL HIGH (ref 4.0–10.5)
nRBC: 0.2 % (ref 0.0–0.2)

## 2024-04-10 MED ORDER — CLONIDINE HCL 0.1 MG PO TABS
0.1000 mg | ORAL_TABLET | Freq: Every day | ORAL | Status: DC
  Administered 2024-04-10 – 2024-04-12 (×3): 0.1 mg via ORAL
  Filled 2024-04-10 (×3): qty 1

## 2024-04-10 MED ORDER — POLYETHYLENE GLYCOL 3350 17 G PO PACK
17.0000 g | PACK | Freq: Every day | ORAL | Status: DC
  Administered 2024-04-10 – 2024-04-12 (×3): 17 g via ORAL
  Filled 2024-04-10 (×3): qty 1

## 2024-04-10 MED ORDER — METOCLOPRAMIDE HCL 5 MG/ML IJ SOLN
10.0000 mg | Freq: Once | INTRAMUSCULAR | Status: AC
  Administered 2024-04-11: 10 mg via INTRAVENOUS
  Filled 2024-04-10: qty 2

## 2024-04-10 NOTE — Progress Notes (Signed)
 Discussed findings in detail with patient and her family at bedside today.  All questions answered to their satisfaction.  Discussed plan for enteroscopy tomorrow for further evaluation of additional bleeding lesions given some maroon-colored output toward the end of the study which is unclear if this is within the jejunum or the ileum given capsule was not complete.  I have discussed the risks, alternatives, benefits with regards to but not limited to the risk of reaction to medication, bleeding, infection, perforation and the patient is agreeable to proceed. Written consent to be obtained.  We discussed that follow-up imaging will need to be performed to ensure passage of the capsule.  We discussed that she may need to have repeat capsule study.  We also discussed the potential need in the future for double-balloon enteroscopy referral versus push ileoscopy as well.  Given she had findings of multiple angiectasia's within the stomach and the duodenal bulb, it is likely that she could have multiple other angiectasia's throughout the entire small bowel.  Further workup will depend on enteroscopy findings and her clinical response in regards to improvement in hemoglobin.   Charmaine Melia, MSN, APRN, FNP-BC, AGACNP-BC [facility] at [facility]

## 2024-04-10 NOTE — Progress Notes (Signed)
 5 yr TCS noted in recall Patient result letter mailed

## 2024-04-10 NOTE — Progress Notes (Signed)
 Mobility Specialist Progress Note:    04/10/24 1430  Mobility  Activity Ambulated with assistance  Level of Assistance Independent  Assistive Device None  Distance Ambulated (ft) 300 ft  Range of Motion/Exercises Active;All extremities  Activity Response Tolerated well  Mobility Referral Yes  Mobility visit 1 Mobility  Mobility Specialist Start Time (ACUTE ONLY) 1430  Mobility Specialist Stop Time (ACUTE ONLY) 1450  Mobility Specialist Time Calculation (min) (ACUTE ONLY) 20 min   Pt received sitting EOB, agreeable to mobility. Independently able to stand and ambulate with no AD. Tolerated well, asx throughout. Left in bathroom, all needs met.  Casey Johnston Mobility Specialist Please contact via Special Educational Needs Teacher or  Rehab office at [phone]

## 2024-04-10 NOTE — Op Note (Signed)
 Small Bowel Givens Capsule Study Procedure date:  04/09/2024  PCP:  Dr. Teresa Jenkins Jansky, FNP  Indication for procedure:  Iron deficiency anemia, melena, and hematochezia with recent EGD with findings of multiple angiectasias with no bleeding or recent bleeding within the stomach and colon with presence of blood without overt cause.   Patient data:  Wt: 90.2 kg Ht: 5'5  Findings:  Not complete to the cecum. Poor prep/field of view. Views intermittently obscured by mucus, partial food contents/bile. The capsule spent 4 hours and 34 minutes in the stomach where multiple areas of erythema, linear erosions, angiectasias and post ablation ulcers were identified (see pictures).  Throughout the study there were multiple scattered and linear erosions and possible additional angiectasias not identified. Toward ed of study about the 07:08:20 mark there was more mucosal staining of possible hematin from the 7 hour window all the way until the end of the study and progressively more darkened material more melena like in the small bowel and some with a maroon tint abut the 08:12:00 mark. See multple images below.                First Gastric image:  00:00:33 First Duodenal image: 04:35:05 First Ileo-Cecal Valve image: Incomplete First Cecal image: Unknown - Incomplete Gastric Passage time: 4h 5m Small Bowel Passage time:  Unable to calculate  Summary & Recommendations: Study not complete to the cecum with views obscured by possible melena as well as gastric contents and mucus. Given study not complete to the cecum I am unsure how far through the small bowel the more melanotic fluid is but I would suspect potentially a little more distal given this is between 2.5-3 hours into the study but given the slow motility of the stomach I can not be sure. She has multiple erosions within the small bowel and some other possible angiectasias along with the angiectasias within the stomach previously. Post  ablated ulcers also seen on this capsule.  Hgb remains stable currently.   Plan for enteroscopy tomorrow with Dr. Cindie given she was not made NPO at midnight and received clear liquid tray this morning.  Clears today only Npo after midnight.  Continue PPI BID Recommend dose of Reglan  tomorrow morning IV about 0700.  Will need KUB prior to discharge to assess for location of the capsule and if still present will need KUB at one week post procedure which would be 11/27 to assess for clearance of the capsule.  If enteroscopy unclear may need to consider repeat capsule vs push ileoscopy or referral to tertiary for double balloon enteroscopy to assess for additional small bowel angiectasia.   Charmaine Melia, MSN, APRN, FNP-BC, AGACNP-BC [facility] at [facility]

## 2024-04-10 NOTE — Progress Notes (Signed)
 PROGRESS NOTE    Casey Johnston  FMW:[id number]  DOB: 1961-01-25  DOA: 04/04/2024 PCP: Teresa Jenkins Jansky, [facility] course:  63 year old female with a history of hypertension, diabetes mellitus type 2, hyperlipidemia, GERD, IBS-C, and anxiety presenting with hematochezia and dark stools.  Patient stated that she first noticed some hematochezia on 04/01/2024.  Subsequently on the evening of 04/03/2024 she noted some dark and black stools mixed in with hematochezia.  She began feeling lightheaded and dizzy.  She felt like she had a fluttering sensation in her chest.  She denied any visual loss, focal extremity weakness.  She denies chest pain, shortness breath, nausea, vomiting.  She has had loose stools.  She denies any abdominal pain.  There is no dysuria or hematuria.  She does not take any blood thinners.  She denies any NSAIDs.    Patient underwent EGD on 04/05/2024.  She was continued on pantoprazole  twice daily.  She has been transfused 9 units PRBC total for the admission.   111/21: Vitals and hemoglobin stable, no obvious bleeding, last BM was on Tuesday so adding some MiraLAX .  Capsule endoscopy with some erosions and angiectasias with a poor prep-GI is going to do enteroscopy tomorrow.  Subjective: Patient was seen and examined today.  No new concern, denies any more bleeding.  Did not had any bowel movement since Tuesday.  Asking for more solid food, will remain on clear liquid diet as going for enteroscopy tomorrow.  Objective: Vitals:   04/09/24 2131 04/10/24 0130 04/10/24 0528 04/10/24 1320  BP: 137/73 (!) 139/57 (!) 136/51 (!) 174/72  Pulse: 67 65 67 71  Resp: 18 18 18 20   Temp: 97.6 F (36.4 C) 97.8 F (36.6 C) 98.8 F (37.1 C) 98.1 F (36.7 C)  TempSrc: Oral Oral Oral Oral  SpO2: 97% 97% 98% 100%  Weight:      Height:       No intake or output data in the 24 hours ending 04/10/24 1518  Filed Weights   04/04/24 0604 04/04/24 0850 04/05/24 1400   Weight: 88 kg 90.3 kg 90.2 kg  Exam:  General.  Well-developed lady, in no acute distress. Pulmonary.  Lungs clear bilaterally, normal respiratory effort. CV.  Regular rate and rhythm, no JVD, rub or murmur. Abdomen.  Soft, nontender, nondistended, BS positive. CNS.  Alert and oriented .  No focal neurologic deficit. Extremities.  No edema, no cyanosis, pulses intact and symmetrical. Psychiatry.  Judgment and insight appears normal.    Data Reviewed:  Basic Metabolic Panel: Recent Labs  Lab 04/05/24 0349 04/06/24 0044 04/08/24 0522 04/09/24 0501 04/10/24 0414  NA 138 137 140 142 141  K 4.2 5.0 4.6 4.3 4.1  CL 107 106 110 109 106  CO2 23 22 28 28 29   GLUCOSE 130* 257* 129* 118* 123*  BUN 36* 40* 8 <5* <5*  CREATININE 0.93 1.07* 0.73 0.73 0.78  CALCIUM  7.6* 7.6* 7.8* 8.4* 8.7*  MG 1.6* 2.0  --   --   --     CBC: Recent Labs  Lab 04/05/24 0349 04/05/24 1155 04/06/24 0044 04/06/24 1015 04/06/24 2235 04/08/24 0522 04/08/24 0614 04/08/24 1609 04/09/24 0501 04/10/24 0414  WBC 15.8*  --  21.4* 23.1*  --   --   --   --  13.4* 12.1*  NEUTROABS  --   --  16.4*  --   --   --   --   --   --   --  HGB 8.1*   < > 5.6* 8.0*   < > 6.7* 6.8* 8.3* 8.2* 8.4*  HCT 24.1*   < > 16.7* 23.4*   < > 20.7* 21.0* 25.1* 25.5* 26.3*  MCV 87.0  --  90.3 87.0  --   --   --   --  90.7 90.7  PLT 140*  --  158 128*  --   --   --   --  154 213   < > = values in this interval not displayed.     Scheduled Meds:  sodium chloride    Intravenous Once   atorvastatin   20 mg Oral Daily   Chlorhexidine  Gluconate Cloth  6 each Topical Q0600   insulin  aspart  0-15 Units Subcutaneous TID WC   insulin  aspart  0-5 Units Subcutaneous QHS   losartan   50 mg Oral Daily   [START ON 04/11/2024] metoCLOPramide  (REGLAN ) injection  10 mg Intravenous Once   pantoprazole  (PROTONIX ) IV  40 mg Intravenous Q12H   polyethylene glycol  17 g Oral Daily   Continuous Infusions:  Assessment & Plan:   ABLA -hgb  stable s/p 9 units PRBCs - PPI  - GI consult appreciated - 11/16 EGD: 4 angioectaasia w/o bleed antrum; 3 angioectasia with recent bleed duodenal bulb, 2 in duodenum---all treated with APC - 11/16 evening--rectal bleed; Hgb dropped from 8.1>>5.6 - 11/17 evening--rectal bleed, Hgb dropped 8.0>>5.6 ; had syncope - 11/18 --colonoscopy--Blood in the entire examined colon; polyps in transverse and descending colon; nonbleeding internal hemorrhoids - 11/18 --enteroscopy non-bleeding gastric and duodenal ulcer -monitor H/H q 12 hours>>CTA GI bleed scan if rebleeds 11/20-- VCE 11/21; capsule endoscopy with poor prep but did show intermittent erosions and concern of angiectasias. Going for enteroscopy tomorrow  HTN Blood pressure elevated. - Restarting home clonidine  - Continue with losartan   DM2 -SSI  Leukocytosis -trended down  OOB/ambulating -can transfer to floor at end of the day if BP stable and no further bleeding, home in AM?   DVT prophylaxis: SCD Code Status: Full Family Communication: Discussed with patient    Principal Problem:   Acute blood loss anemia Active Problems:   Mixed hyperlipidemia   Uncontrolled type 2 diabetes mellitus with hyperglycemia (HCC)   Obesity (BMI 30-39.9)   Symptomatic anemia   Angiectasia of gastrointestinal tract   Diverticulosis of colon without hemorrhage   This record has been created using Conservation officer, historic buildings. Errors have been sought and corrected,but may not always be located. Such creation errors do not reflect on the standard of care.   Amaryllis Dare, MD [facility]  If 7PM-7AM, please contact night-coverage [email or web address]   LOS: 5 days

## 2024-04-11 ENCOUNTER — Inpatient Hospital Stay (HOSPITAL_COMMUNITY)

## 2024-04-11 ENCOUNTER — Inpatient Hospital Stay (HOSPITAL_COMMUNITY): Admitting: Anesthesiology

## 2024-04-11 ENCOUNTER — Encounter (HOSPITAL_COMMUNITY): Admission: EM | Disposition: A | Payer: Self-pay | Source: Home / Self Care | Attending: Internal Medicine

## 2024-04-11 DIAGNOSIS — K269 Duodenal ulcer, unspecified as acute or chronic, without hemorrhage or perforation: Secondary | ICD-10-CM | POA: Diagnosis not present

## 2024-04-11 DIAGNOSIS — K31819 Angiodysplasia of stomach and duodenum without bleeding: Secondary | ICD-10-CM | POA: Diagnosis not present

## 2024-04-11 DIAGNOSIS — D62 Acute posthemorrhagic anemia: Secondary | ICD-10-CM | POA: Diagnosis not present

## 2024-04-11 DIAGNOSIS — K5520 Angiodysplasia of colon without hemorrhage: Secondary | ICD-10-CM | POA: Diagnosis not present

## 2024-04-11 DIAGNOSIS — K259 Gastric ulcer, unspecified as acute or chronic, without hemorrhage or perforation: Secondary | ICD-10-CM | POA: Diagnosis not present

## 2024-04-11 HISTORY — PX: ENTEROSCOPY: SHX5533

## 2024-04-11 LAB — BASIC METABOLIC PANEL WITH GFR
Anion gap: 5 (ref 5–15)
BUN: <5 mg/dL — ABNORMAL LOW (ref 8–23)
CO2: 29 mmol/L (ref 22–32)
Calcium: 8.8 mg/dL — ABNORMAL LOW (ref 8.9–10.3)
Chloride: 106 mmol/L (ref 98–111)
Creatinine, Ser: 0.72 mg/dL (ref 0.44–1.00)
GFR, Estimated: >60 mL/min (ref >60)
Glucose, Bld: 115 mg/dL — ABNORMAL HIGH (ref 70–99)
Potassium: 4.7 mmol/L (ref 3.5–5.1)
Sodium: 141 mmol/L (ref 135–145)

## 2024-04-11 LAB — GLUCOSE, CAPILLARY
Glucose-Capillary: 141 mg/dL — ABNORMAL HIGH (ref 70–99)
Glucose-Capillary: 118 mg/dL — ABNORMAL HIGH (ref 70–99)
Glucose-Capillary: 124 mg/dL — ABNORMAL HIGH (ref 70–99)
Glucose-Capillary: 177 mg/dL — ABNORMAL HIGH (ref 70–99)
Glucose-Capillary: 187 mg/dL — ABNORMAL HIGH (ref 70–99)

## 2024-04-11 LAB — CBC
HCT: 24.1 % — ABNORMAL LOW (ref 36.0–46.0)
Hemoglobin: 7.7 g/dL — ABNORMAL LOW (ref 12.0–15.0)
MCH: 28.9 pg (ref 26.0–34.0)
MCHC: 32 g/dL (ref 30.0–36.0)
MCV: 90.6 fL (ref 80.0–100.0)
Platelets: 231 K/uL (ref 150–400)
RBC: 2.66 MIL/uL — ABNORMAL LOW (ref 3.87–5.11)
RDW: 14.4 % (ref 11.5–15.5)
WBC: 11.4 K/uL — ABNORMAL HIGH (ref 4.0–10.5)
nRBC: 0 % (ref 0.0–0.2)

## 2024-04-11 MED ORDER — MIDAZOLAM HCL 2 MG/2ML IJ SOLN
INTRAMUSCULAR | Status: AC
Start: 2024-04-11 — End: 2024-04-11
  Filled 2024-04-11: qty 2

## 2024-04-11 MED ORDER — LACTATED RINGERS IV SOLN: INTRAVENOUS | Status: DC | PRN

## 2024-04-11 MED ORDER — LIDOCAINE 2% (20 MG/ML) 5 ML SYRINGE
INTRAMUSCULAR | Status: AC
  Filled 2024-04-11: qty 5

## 2024-04-11 MED ORDER — DEXMEDETOMIDINE HCL IN NACL 80 MCG/20ML IV SOLN
INTRAVENOUS | Status: DC | PRN
  Administered 2024-04-11: 12 ug via INTRAVENOUS
  Administered 2024-04-11: 8 ug via INTRAVENOUS

## 2024-04-11 MED ORDER — PROPOFOL 500 MG/50ML IV EMUL
INTRAVENOUS | Status: AC
  Filled 2024-04-11: qty 50

## 2024-04-11 MED ORDER — PROPOFOL 10 MG/ML IV BOLUS
INTRAVENOUS | Status: DC | PRN
  Administered 2024-04-11: 20 mg via INTRAVENOUS
  Administered 2024-04-11: 25 mg via INTRAVENOUS
  Administered 2024-04-11: 20 mg via INTRAVENOUS
  Administered 2024-04-11: 100 mg via INTRAVENOUS
  Administered 2024-04-11: 25 mg via INTRAVENOUS

## 2024-04-11 MED ORDER — LIDOCAINE HCL (CARDIAC) PF 100 MG/5ML IV SOSY
PREFILLED_SYRINGE | INTRAVENOUS | Status: DC | PRN
  Administered 2024-04-11: 100 mg via INTRAVENOUS

## 2024-04-11 MED ORDER — SODIUM CHLORIDE 0.9 % IV SOLN: INTRAVENOUS | Status: DC

## 2024-04-11 MED ORDER — MIDAZOLAM HCL 5 MG/5ML IJ SOLN
INTRAMUSCULAR | Status: DC | PRN
  Administered 2024-04-11: 2 mg via INTRAVENOUS

## 2024-04-11 NOTE — Op Note (Addendum)
 [facility] Patient Name: Casey Johnston Procedure Date: 04/11/2024 9:11 AM MRN: [id number] Date of Birth: [birth year] Attending MD: Carlin POUR. Cindie , [place], [id number] CSN: [id number] Age: 63 Admit Type: Inpatient Procedure:                Small bowel enteroscopy Indications:              Acute post hemorrhagic anemia, Hematochezia,                            Melena, Obscure gastrointestinal bleeding Providers:                Carlin POUR. Cindie, DO, Olam Ada, RN, Gordy Bruckner Tech, Technician Referring MD:              Medicines:                See the Anesthesia note for documentation of the                            administered medications Complications:            No immediate complications. Estimated Blood Loss:     Estimated blood loss was minimal. Procedure:                Pre-Anesthesia Assessment:                           - The anesthesia plan was to use monitored                            anesthesia care (MAC).                           After obtaining informed consent, the endoscope was                            passed under direct vision. Throughout the                            procedure, the patient's blood pressure, pulse, and                            oxygen saturations were monitored continuously. The                            [id number]-[id number] ([id number]) Ultra Slim Colon was                            introduced through the mouth and advanced to the                            proximal jejunum. The small bowel enteroscopy was                            accomplished without difficulty. The patient  tolerated the procedure well. Scope In: 10:45:54 AM Scope Out: 11:05:09 AM Total Procedure Duration: 0 hours 19 minutes 15 seconds  Findings:      The examined esophagus was normal.      Four non-bleeding cratered gastric ulcers with a clean ulcer base       (Forrest Class III) were found in the gastric  antrum. The largest lesion       was 10 mm in largest dimension. These were post ablation ulcers from       prior APC      One non-bleeding cratered duodenal ulcer with a clean ulcer base       (Forrest Class III) was found in the duodenal bulb. The lesion was 8 mm       in largest dimension. This was a post ablation ulcer from prior APC      Three angioectasias with no bleeding were found in the third portion of       the duodenum and in the fourth portion of the duodenum. Coagulation for       bleeding prevention using argon plasma was successful.      A single angioectasia with no bleeding was found in the proximal       jejunum. Coagulation for bleeding prevention using argon plasma was       successful. Impression:               - Normal esophagus.                           - Non-bleeding gastric ulcers with a clean ulcer                            base (Forrest Class III).                           - Non-bleeding duodenal ulcer with a clean ulcer                            base (Forrest Class III).                           - Three non-bleeding angioectasias in the duodenum.                            Treated with argon plasma coagulation (APC).                           - A single non-bleeding angioectasia in the                            jejunum. Treated with argon plasma coagulation                            (APC).                           - No specimens collected. Moderate Sedation:      Per Anesthesia Care Recommendation:           - Return patient to hospital ward for ongoing care.                           -  Soft diet.                           - PPI BID                           - Continue to monitor H&H                           - Hopeful DC in next 24-48 hours Procedure Code(s):        --- Professional ---                           [phone], Small intestinal endoscopy, enteroscopy                            beyond second portion of duodenum, not including                             ileum; with control of bleeding (eg, injection,                            bipolar cautery, unipolar cautery, laser, heater                            probe, stapler, plasma coagulator) Diagnosis Code(s):        --- Professional ---                           K25.9, Gastric ulcer, unspecified as acute or                            chronic, without hemorrhage or perforation                           K26.9, Duodenal ulcer, unspecified as acute or                            chronic, without hemorrhage or perforation                           K31.819, Angiodysplasia of stomach and duodenum                            without bleeding                           K55.20, Angiodysplasia of colon without hemorrhage                           D62, Acute posthemorrhagic anemia                           K92.1, Melena (includes Hematochezia)                           K92.2, Gastrointestinal hemorrhage, unspecified CPT copyright 2022 American  Medical Association. All rights reserved. The codes documented in this report are preliminary and upon coder review may  be revised to meet current compliance requirements. Carlin POUR. Cindie, DO Carlin POUR. Cindie, DO 04/11/2024 11:16:08 AM This report has been signed electronically. Number of Addenda: 0

## 2024-04-11 NOTE — Anesthesia Preprocedure Evaluation (Signed)
 Anesthesia Evaluation  Patient identified by MRN, date of birth, ID band Patient awake    Reviewed: Allergy & Precautions, H&P , NPO status , Patient's Chart, lab work & pertinent test results, reviewed documented beta blocker date and time   Airway Mallampati: II  TM Distance: >3 FB Neck ROM: full    Dental no notable dental hx. (+) Dental Advisory Given, Teeth Intact   Pulmonary sleep apnea , Current Smoker and Patient abstained from smoking.   Pulmonary exam normal breath sounds clear to auscultation       Cardiovascular Exercise Tolerance: Good hypertension, Normal cardiovascular exam Rhythm:regular Rate:Normal     Neuro/Psych  Headaches PSYCHIATRIC DISORDERS Anxiety Depression       GI/Hepatic Neg liver ROS,GERD  ,,  Endo/Other  diabetes, Poorly Controlled, Type 2    Renal/GU negative Renal ROS  negative genitourinary   Musculoskeletal   Abdominal   Peds  Hematology  (+) Blood dyscrasia, anemia Hgb 8.5   Anesthesia Other Findings   Reproductive/Obstetrics negative OB ROS                              Anesthesia Physical Anesthesia Plan  ASA: 3 and emergent  Anesthesia Plan: General   Post-op Pain Management: Minimal or no pain anticipated   Induction:   PONV Risk Score and Plan: Propofol  infusion  Airway Management Planned: Natural Airway and Nasal Cannula  Additional Equipment: None  Intra-op Plan:   Post-operative Plan:   Informed Consent: I have reviewed the patients History and Physical, chart, labs and discussed the procedure including the risks, benefits and alternatives for the proposed anesthesia with the patient or authorized representative who has indicated his/her understanding and acceptance.     Dental Advisory Given  Plan Discussed with: CRNA  Anesthesia Plan Comments:          Anesthesia Quick Evaluation

## 2024-04-11 NOTE — Transfer of Care (Signed)
 Immediate Anesthesia Transfer of Care Note  Patient: Casey Johnston  Procedure(s) Performed: ENTEROSCOPY  Patient Location: PACU  Anesthesia Type:General  Level of Consciousness: sedated  Airway & Oxygen Therapy: Patient connected to nasal cannula oxygen and Patient connected to face mask oxygen  Post-op Assessment: Post -op Vital signs reviewed and stable  Post vital signs: stable  Last Vitals:  Vitals Value Taken Time  BP    Temp    Pulse    Resp    SpO2      Last Pain:  Vitals:   04/11/24 1039  TempSrc: Oral  PainSc:       Patients Stated Pain Goal: 0 (04/08/24 0800)  Complications: There were no known notable events for this encounter.

## 2024-04-11 NOTE — H&P (Signed)
 Primary Care Physician:  Teresa Jenkins Jansky, FNP Primary Gastroenterologist:  Dr. Cindie  Pre-Procedure History & Physical: HPI:  Casey Johnston is a 63 y.o. female presents for a small bowel enteroscopy due to acute blood loss anemia, GI bleeding, abnormal capsule endoscopy study.   Past Medical History:  Diagnosis Date   Anxiety and depression    Chronic constipation    Diabetes mellitus, type II (HCC)    Dr. Lenis managing   Frequent headaches chronic   ibuprofen helps some   GERD (gastroesophageal reflux disease)    History of UTI    Recurrent per pt (approx 3 per year)   Hyperlipidemia    Hypertension    Leukocytosis 12/2015   mild lymphocytosis.  Path smear review reassuring.  Repeat 01/2016 stable.   Morbid obesity (HCC)    OSA on CPAP    not using CPAP anymore    Past Surgical History:  Procedure Laterality Date   ABDOMINAL HYSTERECTOMY  1998   Dysfunctional uterine bleeding.  No hx of abnormal paps.   BIOPSY  10/20/2018   Procedure: BIOPSY;  Surgeon: Harvey Margo CROME, MD;  [facility];  Service: Endoscopy;;  stomach   CATARACT EXTRACTION W/PHACO Left 08/24/2022   Procedure: CATARACT EXTRACTION PHACO AND INTRAOCULAR LENS PLACEMENT (IOC);  Surgeon: Harrie Agent, MD;  Location: [facility];  Service: Ophthalmology;  Laterality: Left;  CDE 8.53   CATARACT EXTRACTION W/PHACO Right 09/07/2022   Procedure: CATARACT EXTRACTION PHACO AND INTRAOCULAR LENS PLACEMENT (IOC);  Surgeon: Harrie Agent, MD;  Location: [facility];  Service: Ophthalmology;  Laterality: Right;  CDE: 10.02   COLONOSCOPY  2012   Dr. Gaylia: 1.5 cm pendunculated descending colon polyp (tubulovillous adenoma) and diminutive hyperplastic polyps   COLONOSCOPY N/A 10/20/2018   Procedure: COLONOSCOPY;  Surgeon: Harvey Margo CROME, MD; 4 tubular adenomas, diverticulosis in the sigmoid colon, tortuous left colon, external and internal hemorrhoids. Due for repeat in 2023.    COLONOSCOPY N/A 04/07/2024   Procedure:  COLONOSCOPY;  Surgeon: Eartha Angelia Sieving, MD;  [facility];  Service: Gastroenterology;  Laterality: N/A;   COLONOSCOPY WITH PROPOFOL  N/A 12/24/2022   Procedure: COLONOSCOPY WITH PROPOFOL ;  Surgeon: Cindie Carlin POUR, DO;  [facility];  Service: Endoscopy;  Laterality: N/A;  11:15 am, asa 3   ESOPHAGOGASTRODUODENOSCOPY N/A 10/20/2018   Procedure: ESOPHAGOGASTRODUODENOSCOPY (EGD);  Surgeon: Harvey Margo CROME, MD;  gastritis and duodenitis. No H. Pylori.    ESOPHAGOGASTRODUODENOSCOPY N/A 04/05/2024   Procedure: EGD (ESOPHAGOGASTRODUODENOSCOPY);  Surgeon: Cinderella Deatrice JULIANNA, MD;  [facility];  Service: Endoscopy;  Laterality: N/A;   ESOPHAGOGASTRODUODENOSCOPY N/A 04/07/2024   Procedure: EGD (ESOPHAGOGASTRODUODENOSCOPY);  Surgeon: Eartha Angelia, Sieving, MD;  [facility];  Service: Gastroenterology;  Laterality: N/A;  WITH ENTEROSCOPY   GIVENS CAPSULE STUDY N/A 04/09/2024   Procedure: IMAGING PROCEDURE, GI TRACT, INTRALUMINAL, VIA CAPSULE;  Surgeon: Cinderella Deatrice JULIANNA, MD;  [facility];  Service: Endoscopy;  Laterality: N/A;   POLYPECTOMY  10/20/2018   Procedure: POLYPECTOMY;  Surgeon: Harvey Margo CROME, MD;  [facility];  Service: Endoscopy;;   POLYPECTOMY  12/24/2022   Procedure: POLYPECTOMY INTESTINAL;  Surgeon: Cindie Carlin POUR, DO;  [facility];  Service: Endoscopy;;   wart removal  08/04/2019   benign    Prior to Admission medications   Medication Sig Start Date End Date Taking? Authorizing Provider  atorvastatin  (LIPITOR) 20 MG tablet TAKE 1 Tablet BY MOUTH ONCE EVERY DAY Patient  taking differently: Take 20 mg by mouth daily. TAKE 1 Tablet BY MOUTH ONCE EVERY DAY 10/05/21  Yes Comer Kirsch, PA-C  cloNIDine  (CATAPRES ) 0.1 MG tablet Take 1 tablet (0.1 mg total) by mouth 2 (two) times daily. Patient taking differently: Take 0.1 mg by mouth daily. 10/05/21  Yes Comer Kirsch, PA-C  Dulaglutide (TRULICITY)  0.75 MG/0.5ML SOPN Inject 0.75 mg into the skin every Monday.   Yes [provider]  glipiZIDE  (GLUCOTROL  XL) 5 MG 24 hr tablet Take 1 tablet (5 mg total) by mouth daily with breakfast. 10/18/22  Yes Nida, Gebreselassie W, MD  linaclotide  (LINZESS ) 72 MCG capsule Take 72 mcg by mouth daily as needed (constipation).   Yes [provider]  losartan -hydrochlorothiazide  (HYZAAR) 100-12.5 MG tablet Take 1 tablet by mouth daily. 11/09/22  Yes [provider]  metFORMIN  (GLUCOPHAGE ) 1000 MG tablet Take 1 tablet (1,000 mg total) by mouth 2 (two) times daily with a meal. 10/05/21  Yes Comer Kirsch, PA-C    Allergies as of 04/04/2024 - Review Complete 04/04/2024  Allergen Reaction Noted   Januvia  [sitagliptin ] Other (See Comments) 05/21/2018   Amlodipine  Swelling 10/19/2020    Family History  Problem Relation Age of Onset   Cancer Mother        ovarian   COPD Father    Diabetes Father    Hyperlipidemia Father    Heart attack Father    Heart failure Father    Colon cancer Neg Hx     Social History   Socioeconomic History   Marital status: Divorced    Spouse name: Not on file   Number of children: Not on file   Years of education: Not on file   Highest education level: Not on file  Occupational History   Not on file  Tobacco Use   Smoking status: Every Day    Current packs/day: 0.25    Average packs/day: 0.3 packs/day for 20.0 years (5.0 ttl pk-yrs)    Types: Cigarettes   Smokeless tobacco: Never  Vaping Use   Vaping status: Never Used  Substance and Sexual Activity   Alcohol use: No   Drug use: No   Sexual activity: Not on file  Other Topics Concern   Not on file  Social History Narrative   Separated x 20 yrs, one daughter.  She lives with her.  Great niece and nephew live with her as well.   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   Tob: 7 pack year hx, quit 2010.   Alc: none   Exercise: none   Social Drivers of Corporate Investment Banker  Strain: Low Risk (03/26/2023)   Received from [facility]   Overall Financial Resource Strain (CARDIA)    Difficulty of Paying Living Expenses: Not hard at all  Food Insecurity: No Food Insecurity (04/04/2024)   Hunger Vital Sign    Worried About Running Out of Food in the Last Year: Never true    Ran Out of Food in the Last Year: Never true  Transportation Needs: No Transportation Needs (04/04/2024)   PRAPARE - Administrator, Civil Service (Medical): No    Lack of Transportation (Non-Medical): No  Physical Activity: Sufficiently Active (03/26/2023)   Received from [facility]   Exercise Vital Sign    On average, how many days per week do you engage in moderate to strenuous exercise (like a brisk walk)?: 6 days    On average, how many minutes do you engage in  exercise at this level?: 30 min  Stress: Stress Concern Present (03/26/2023)   Received from [facility] Occupational [facility] - Occupational Stress Questionnaire    Feeling of Stress : To some extent  Social Connections: Moderately Isolated (03/26/2023)   Received from [facility]   Social Connection and Isolation Panel    In a typical week, how many times do you talk on the phone with family, friends, or neighbors?: Once a week    How often do you get together with friends or relatives?: Three times a week    How often do you attend church or religious services?: More than 4 times per year    Do you belong to any clubs or organizations such as church groups, unions, fraternal or athletic groups, or school groups?: No    How often do you attend meetings of the clubs or organizations you belong to?: Never    Are you married, widowed, divorced, separated, never married, or living with a partner?: Divorced  Intimate Partner Violence: Not At Risk (04/04/2024)   Humiliation, Afraid, Rape, and Kick questionnaire    Fear of Current or Ex-Partner: No    Emotionally Abused: No    Physically  Abused: No    Sexually Abused: No    Review of Systems: General: Negative for fever, chills, fatigue, weakness. Eyes: Negative for vision changes.  ENT: Negative for hoarseness, difficulty swallowing , nasal congestion. CV: Negative for chest pain, angina, palpitations, dyspnea on exertion, peripheral edema.  Respiratory: Negative for dyspnea at rest, dyspnea on exertion, cough, sputum, wheezing.  GI: See history of present illness. GU:  Negative for dysuria, hematuria, urinary incontinence, urinary frequency, nocturnal urination.  MS: Negative for joint pain, low back pain.  Derm: Negative for rash or itching.  Neuro: Negative for weakness, abnormal sensation, seizure, frequent headaches, memory loss, confusion.  Psych: Negative for anxiety, depression Endo: Negative for unusual weight change.  Heme: Negative for bruising or bleeding. Allergy: Negative for rash or hives.  Physical Exam: Vital signs in last 24 hours: Temp:  [98.1 F (36.7 C)-98.7 F (37.1 C)] 98.7 F (37.1 C) (11/22 0413) Pulse Rate:  [62-71] 62 (11/22 0413) Resp:  [18-20] 18 (11/22 0413) BP: (126-174)/(48-72) 126/48 (11/22 0413) SpO2:  [95 %-100 %] 95 % (11/22 0413) Last BM Date : 04/06/24 General:   Alert,  Well-developed, well-nourished, pleasant and cooperative in NAD Head:  Normocephalic and atraumatic. Eyes:  Sclera clear, no icterus.   Conjunctiva pink. Ears:  Normal auditory acuity. Nose:  No deformity, discharge,  or lesions. Msk:  Symmetrical without gross deformities. Normal posture. Extremities:  Without clubbing or edema. Neurologic:  Alert and  oriented x4;  grossly normal neurologically. Skin:  Intact without significant lesions or rashes. Psych:  Alert and cooperative. Normal mood and affect.   Impression/Plan: Casey Johnston is here for a small bowel enteroscopy due to acute blood loss anemia, GI bleeding, abnormal capsule endoscopy study.   Risks, benefits, limitations,  imponderables and alternatives regarding procedure have been reviewed with the patient. Questions have been answered. All parties agreeable.

## 2024-04-11 NOTE — Anesthesia Postprocedure Evaluation (Signed)
 Anesthesia Post Note  Patient: Casey Johnston  Procedure(s) Performed: ENTEROSCOPY  Patient location during evaluation: Phase II Anesthesia Type: General Level of consciousness: awake and alert Pain management: pain level controlled Vital Signs Assessment: post-procedure vital signs reviewed and stable Respiratory status: spontaneous breathing, nonlabored ventilation, respiratory function stable and patient connected to nasal cannula oxygen Cardiovascular status: stable Anesthetic complications: no   There were no known notable events for this encounter.   Last Vitals:  Vitals:   04/11/24 0413 04/11/24 1039  BP: (!) 126/48 (!) 145/55  Pulse: 62 60  Resp: 18 12  Temp: 37.1 C 37.1 C  SpO2: 95%     Last Pain:  Vitals:   04/11/24 1039  TempSrc: Oral  PainSc:                  Carlin LITTIE Kawasaki

## 2024-04-11 NOTE — Progress Notes (Signed)
 PROGRESS NOTE    Casey Johnston  FMW:[id number]  DOB: 05-Jan-1961  DOA: 04/04/2024 PCP: Teresa Jenkins Jansky, [facility] course:  63 year old female with a history of hypertension, diabetes mellitus type 2, hyperlipidemia, GERD, IBS-C, and anxiety presenting with hematochezia and dark stools.  Patient stated that she first noticed some hematochezia on 04/01/2024.  Subsequently on the evening of 04/03/2024 she noted some dark and black stools mixed in with hematochezia.  She began feeling lightheaded and dizzy.  She felt like she had a fluttering sensation in her chest.  She denied any visual loss, focal extremity weakness.  She denies chest pain, shortness breath, nausea, vomiting.  She has had loose stools.  She denies any abdominal pain.  There is no dysuria or hematuria.  She does not take any blood thinners.  She denies any NSAIDs.    Patient underwent EGD on 04/05/2024.  She was continued on pantoprazole  twice daily.  She has been transfused 9 units PRBC total for the admission.   11/21: Capsule endoscopy 11/22: Enteroscopy today.  Hemoglobin down to 7.7  Subjective: Patient seen and examined at the bedside.  She reports no bowel movement in the past few days.  Hemoglobin is stable now.  Plan for enteroscopy today.  Hemoglobin down to 7.7  Objective: Vitals:   04/11/24 1039 04/11/24 1115 04/11/24 1130 04/11/24 1302  BP: (!) 145/55 (!) 97/48 (!) 103/48 132/61  Pulse: 60 62 63 (!) 57  Resp: 12 (!) 7 11 18   Temp: 98.8 F (37.1 C) 98.6 F (37 C)  98.1 F (36.7 C)  TempSrc: Oral   Oral  SpO2:  99% 94% 98%  Weight:      Height:       No intake or output data in the 24 hours ending 04/11/24 1440  Filed Weights   04/04/24 0604 04/04/24 0850 04/05/24 1400  Weight: 88 kg 90.3 kg 90.2 kg  Exam:  General.  Well-developed lady, in no acute distress. Pulmonary.  Lungs clear bilaterally, normal respiratory effort. CV.  Regular rate and rhythm, no JVD, rub or  murmur. Abdomen.  Soft, nontender, nondistended, BS positive. CNS.  Alert and oriented .  No focal neurologic deficit. Extremities.  No edema, no cyanosis, pulses intact and symmetrical. Psychiatry.  Judgment and insight appears normal.    Data Reviewed:  Basic Metabolic Panel: Recent Labs  Lab 04/05/24 0349 04/06/24 0044 04/08/24 0522 04/09/24 0501 04/10/24 0414 04/11/24 0323  NA 138 137 140 142 141 141  K 4.2 5.0 4.6 4.3 4.1 4.7  CL 107 106 110 109 106 106  CO2 23 22 28 28 29 29   GLUCOSE 130* 257* 129* 118* 123* 115*  BUN 36* 40* 8 <5* <5* <5*  CREATININE 0.93 1.07* 0.73 0.73 0.78 0.72  CALCIUM  7.6* 7.6* 7.8* 8.4* 8.7* 8.8*  MG 1.6* 2.0  --   --   --   --     CBC: Recent Labs  Lab 04/06/24 0044 04/06/24 1015 04/06/24 2235 04/08/24 0614 04/08/24 1609 04/09/24 0501 04/10/24 0414 04/11/24 0323  WBC 21.4* 23.1*  --   --   --  13.4* 12.1* 11.4*  NEUTROABS 16.4*  --   --   --   --   --   --   --   HGB 5.6* 8.0*   < > 6.8* 8.3* 8.2* 8.4* 7.7*  HCT 16.7* 23.4*   < > 21.0* 25.1* 25.5* 26.3* 24.1*  MCV 90.3 87.0  --   --   --  90.7 90.7 90.6  PLT 158 128*  --   --   --  154 213 231   < > = values in this interval not displayed.     Scheduled Meds:  sodium chloride    Intravenous Once   atorvastatin   20 mg Oral Daily   Chlorhexidine  Gluconate Cloth  6 each Topical Q0600   cloNIDine   0.1 mg Oral Daily   insulin  aspart  0-15 Units Subcutaneous TID WC   insulin  aspart  0-5 Units Subcutaneous QHS   losartan   50 mg Oral Daily   pantoprazole  (PROTONIX ) IV  40 mg Intravenous Q12H   polyethylene glycol  17 g Oral Daily   Continuous Infusions:  Assessment & Plan:   ABLA -hgb stable s/p 9 units PRBCs - PPI  - GI consult appreciated - 11/16 EGD: 4 angioectaasia w/o bleed antrum; 3 angioectasia with recent bleed duodenal bulb, 2 in duodenum---all treated with APC - 11/16 evening--rectal bleed; Hgb dropped from 8.1>>5.6 - 11/17 evening--rectal bleed, Hgb dropped  8.0>>5.6 ; had syncope - 11/18 --colonoscopy--Blood in the entire examined colon; polyps in transverse and descending colon; nonbleeding internal hemorrhoids - 11/18 --enteroscopy non-bleeding gastric and duodenal ulcer -monitor H/H q 12 hours>>CTA GI bleed scan if rebleeds 11/20-- VCE 11/21; capsule endoscopy with poor prep but did show intermittent erosions and concern of angiectasias. 11/22: Enteroscopy today with AVMs, treated.  Also multiple ulcers that were not bleeding. - Will continue to monitor H&H.  HTN Blood pressure elevated. - Continue clonidine  - Continue with losartan   DM2 -SSI  Leukocytosis -trended down   DVT prophylaxis: SCD Code Status: Full Family Communication: Discussed with patient    Principal Problem:   Acute blood loss anemia Active Problems:   Mixed hyperlipidemia   Uncontrolled type 2 diabetes mellitus with hyperglycemia (HCC)   Obesity (BMI 30-39.9)   Symptomatic anemia   Angiectasia of gastrointestinal tract   Diverticulosis of colon without hemorrhage   This record has been created using Conservation officer, historic buildings. Errors have been sought and corrected,but may not always be located. Such creation errors do not reflect on the standard of care.   Derryl Duval, MD [facility]  If 7PM-7AM, please contact night-coverage [email or web address]   LOS: 6 days

## 2024-04-11 NOTE — Plan of Care (Signed)
   Problem: Education: Goal: Knowledge of General Education information will improve Description Including pain rating scale, medication(s)/side effects and non-pharmacologic comfort measures Outcome: Progressing

## 2024-04-12 ENCOUNTER — Telehealth: Payer: Self-pay | Admitting: Internal Medicine

## 2024-04-12 DIAGNOSIS — D62 Acute posthemorrhagic anemia: Secondary | ICD-10-CM | POA: Diagnosis not present

## 2024-04-12 LAB — GLUCOSE, CAPILLARY
Glucose-Capillary: 150 mg/dL — ABNORMAL HIGH (ref 70–99)
Glucose-Capillary: 225 mg/dL — ABNORMAL HIGH (ref 70–99)

## 2024-04-12 LAB — HEMOGLOBIN AND HEMATOCRIT, BLOOD
HCT: 25.9 % — ABNORMAL LOW (ref 36.0–46.0)
Hemoglobin: 8 g/dL — ABNORMAL LOW (ref 12.0–15.0)

## 2024-04-12 MED ORDER — PANTOPRAZOLE SODIUM 40 MG PO TBEC: 40.0000 mg | DELAYED_RELEASE_TABLET | Freq: Two times a day (BID) | ORAL | 1 refills | Status: DC

## 2024-04-12 NOTE — Plan of Care (Signed)
  Problem: Education: Goal: Knowledge of General Education information will improve Description: Including pain rating scale, medication(s)/side effects and non-pharmacologic comfort measures 04/12/2024 1252 by Carolee Rock SAUNDERS, RN Outcome: Progressing 04/12/2024 1252 by Carolee Rock SAUNDERS, RN Outcome: Progressing

## 2024-04-12 NOTE — Telephone Encounter (Signed)
 Please arrange hospital fu visit for this patient with any of the APPs in 3-4 weeks. Thank you!

## 2024-04-12 NOTE — Plan of Care (Signed)
  Problem: Education: Goal: Knowledge of General Education information will improve Description: Including pain rating scale, medication(s)/side effects and non-pharmacologic comfort measures Outcome: Progressing   Problem: Health Behavior/Discharge Planning: Goal: Ability to manage health-related needs will improve Outcome: Progressing   Problem: Clinical Measurements: Goal: Ability to maintain clinical measurements within normal limits will improve Outcome: Progressing Goal: Diagnostic test results will improve Outcome: Progressing   Problem: Coping: Goal: Level of anxiety will decrease Outcome: Progressing   Problem: Pain Managment: Goal: General experience of comfort will improve and/or be controlled Outcome: Progressing   Problem: Safety: Goal: Ability to remain free from injury will improve Outcome: Progressing   Problem: Education: Goal: Ability to describe self-care measures that may prevent or decrease complications (Diabetes Survival Skills Education) will improve Outcome: Progressing   Problem: Metabolic: Goal: Ability to maintain appropriate glucose levels will improve Outcome: Progressing   Problem: Skin Integrity: Goal: Risk for impaired skin integrity will decrease Outcome: Progressing

## 2024-04-12 NOTE — Discharge Summary (Signed)
 Physician Discharge Summary  Casey Johnston FMW:[id number] DOB: 1961-01-18 DOA: 04/04/2024  PCP: Teresa Jenkins Jansky, FNP  Admit date: 04/04/2024 Discharge date: 04/12/2024  Admitted From: Home Disposition: Home  Recommendations for Outpatient Follow-up:  Follow up with PCP in 1 week with repeat CBC F/u with GI Follow up in ED if symptoms worsen or new appear   Home Health: No Equipment/Devices: None  Discharge Condition: Stable CODE STATUS: Full Diet recommendation: Heart healthy  Brief/Interim Summary: 63 year old female with a history of hypertension, diabetes mellitus type 2, hyperlipidemia, GERD, IBS-C, and anxiety presenting with hematochezia and dark stools.  Patient stated that she first noticed some hematochezia on 04/01/2024.  Subsequently on the evening of 04/03/2024 she noted some dark and black stools mixed in with hematochezia.  She began feeling lightheaded and dizzy.  She felt like she had a fluttering sensation in her chest.  She denied any visual loss, focal extremity weakness.  She denies chest pain, shortness breath, nausea, vomiting.  She has had loose stools.  She denies any abdominal pain.  There is no dysuria or hematuria.  She does not take any blood thinners.  She denies any NSAIDs.  Hemoglobin 7.3 on presentation.  Patient admitted for evaluation of GI bleeding.  Hemoglobin dropped as low as 5.3.  Records notable for persistent blood loss anemia for which she received total of 9 unit PRBC during this hospitalization.   She underwent multiple procedures, she has undergone EGD x 2 and colonoscopy as well as capsule endoscopy.  EGD 11/16.  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  Underwent colonoscopy on 11/18 showing blood in entire colon.  EGD 11/18 showed multiple gastric as well as duodenal ulcers that were not bleeding. 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  Also several ulcers in stomach that were not bleeding. Hemoglobin has finally stabilized with no bowel movement for several days prior to discharge. Patient discharged home.  Protonix  40 mg twice daily prescribed for 2 months.  Patient will follow-up with GI outpatient. She is recommended to follow-up with PCP within a week and obtain laboratory work to ensure stable hemoglobin. Return precautions were discussed with patient    Discharge Diagnoses:  Principal Problem:   Acute blood loss anemia Active Problems:   Mixed hyperlipidemia   Uncontrolled type 2 diabetes mellitus with hyperglycemia (HCC)   Obesity (BMI 30-39.9)   Symptomatic anemia   Angiectasia of gastrointestinal tract   Diverticulosis of colon without hemorrhage    Discharge Instructions  Discharge Instructions     Ambulatory referral to Gastroenterology   Complete by: As directed    GI bleeding f/u   What is the reason for referral?: Other   Call MD for:  extreme fatigue   Complete by: As directed    Call MD for:  persistant dizziness or light-headedness   Complete by: As directed    Call MD for:  persistant nausea and vomiting   Complete by: As directed    Call MD for:  severe uncontrolled pain   Complete by: As directed    Diet - low sodium heart healthy   Complete by: As directed    Diet Carb Modified   Complete by: As directed    Discharge instructions   Complete by: As directed    1.  Start taking Protonix  40 mg twice daily.  Follow-up with gastroenterology 2.  Follow-up with PCP within a week.  We recommend laboratory work to check CBC and BMP 3.  Return to the ER if dark stools, nausea, vomiting, dizziness, near fainting/fainting spells   Increase activity slowly   Complete by: As directed       Allergies as of 04/12/2024       Reactions   Januvia  [sitagliptin ] Other (See Comments)   Caused pancreatitis   Amlodipine  Swelling        Medication List     TAKE  these medications    atorvastatin  20 MG tablet Commonly known as: LIPITOR TAKE 1 Tablet BY MOUTH ONCE EVERY DAY What changed:  how much to take how to take this when to take this   cloNIDine  0.1 MG tablet Commonly known as: CATAPRES  Take 1 tablet (0.1 mg total) by mouth 2 (two) times daily. What changed: when to take this   glipiZIDE  5 MG 24 hr tablet Commonly known as: GLUCOTROL  XL Take 1 tablet (5 mg total) by mouth daily with breakfast.   Linzess  72 MCG capsule Generic drug: linaclotide  Take 72 mcg by mouth daily as needed (constipation).   losartan -hydrochlorothiazide  100-12.5 MG tablet Commonly known as: HYZAAR Take 1 tablet by mouth daily.   metFORMIN  1000 MG tablet Commonly known as: GLUCOPHAGE  Take 1 tablet (1,000 mg total) by mouth 2 (two) times daily with a meal.   pantoprazole  40 MG tablet Commonly known as: Protonix  Take 1 tablet (40 mg total) by mouth 2 (two) times daily before a meal.   Trulicity 0.75 MG/0.5ML Soaj Generic drug: Dulaglutide Inject 0.75 mg into the skin every Monday.        Follow-up Information     Cindie Carlin POUR, DO. Call in 2 week(s).   Specialty: Gastroenterology Contact information: [address] [place] [address] [phone]         Teresa Jenkins Jansky, FNP. Schedule an appointment as soon as possible for a visit in 1 week(s).   Specialty: Family Medicine Contact information: [address] [place] [address] [phone]                Allergies  Allergen Reactions   Januvia  [Sitagliptin ] Other (See Comments)    Caused pancreatitis   Amlodipine  Swelling    Consultations:    Procedures/Studies: DG Abd 1 View Result Date: 04/11/2024 CLINICAL DATA:  [id number] Foreign body alimentary tract [id number] EXAM: ABDOMEN - 1 VIEW COMPARISON:  April 08, 2024 FINDINGS: Air-filled nondilated loops of bowel. Air is visualized in the rectum. Endoscopy capsule projects over the LEFT upper quadrant. Degenerative  changes of the lumbar spine. Atherosclerotic calcifications. IMPRESSION: Endoscopy capsule projects over the LEFT upper quadrant. Electronically Signed   By: Corean Salter M.D.   On: 04/11/2024 12:09   CT ANGIO GI BLEED Result Date: 04/08/2024 EXAM: CTA ABDOMEN AND PELVIS WITH CONTRAST 04/08/2024 11:42:52 AM TECHNIQUE: CTA images of the abdomen and pelvis with intravenous contrast. 100 mL (iohexol  (OMNIPAQUE ) 350 MG/ML injection 100 mL IOHEXOL  350 MG/ML SOLN). Three-dimensional MIP/volume rendered formations were performed. Automated exposure control, iterative reconstruction, and/or weight based adjustment of the mA/kV was utilized to reduce the radiation dose to as low as reasonably achievable. COMPARISON: None available. CLINICAL HISTORY: hypotension, symptomatic anemia with persistent decline in hemoglobin FINDINGS: VASCULATURE: GI BLEED: No evidence of active gastrointestinal bleeding within the stomach, small bowel, or colon. AORTA: No acute finding. No abdominal aortic aneurysm. No dissection. CELIAC TRUNK: No acute finding. No occlusion or significant stenosis. SUPERIOR MESENTERIC ARTERY:  No acute finding. No occlusion or significant stenosis. INFERIOR MESENTERIC ARTERY: No acute finding. No occlusion or significant stenosis. RENAL ARTERIES: No acute finding. No occlusion or significant stenosis. ILIAC ARTERIES: No acute finding. No occlusion or significant stenosis. ABDOMEN/PELVIS: LOWER CHEST: Bibasilar interstitial thickening and mild peripheral consolidation potential pulmonary infection or aspiration pneumonitis. LIVER: The liver is unremarkable. GALLBLADDER AND BILE DUCTS: Gallbladder is unremarkable. No biliary ductal dilatation. SPLEEN: The spleen is unremarkable. PANCREAS: The pancreas is unremarkable. ADRENAL GLANDS: Bilateral adrenal glands demonstrate no acute abnormality. KIDNEYS, URETERS AND BLADDER: Adjacent nonenhancing simple fluid attenuation cyst of the right kidney measured 3.6 x  2.4 cm. No follow-up recommended for benign renal lesion . No stones in the kidneys or ureters. No hydronephrosis. No perinephric or periureteral stranding. Urinary bladder is unremarkable. GI AND BOWEL: Stomach and duodenal sweep demonstrate no acute abnormality. Several diverticula of the left colon without acute inflammation. There is no bowel obstruction. No abnormal bowel wall thickening or distension. REPRODUCTIVE: Post hysterectomy. PERITONEUM AND RETROPERITONEUM: No ascites or free air. LYMPH NODES: No lymphadenopathy. BONES AND SOFT TISSUES: No acute abnormality of the bones. No acute soft tissue abnormality. IMPRESSION: 1. No evidence of active gastrointestinal bleeding. 2. Bibasilar interstitial thickening with mild peripheral consolidation, concerning for pulmonary infection versus aspiration pneumonitis. Electronically signed by: Norleen Boxer MD 04/08/2024 12:13 PM EST RP Workstation: [id number]      Subjective:   Discharge Exam: Vitals:   04/11/24 1940 04/12/24 0512  BP: 130/63 (!) 153/51  Pulse: 70 73  Resp: 16 15  Temp: 98.1 F (36.7 C) 98.3 F (36.8 C)  SpO2: 100% 99%    General: Pt is alert, awake, not in acute distress Cardiovascular: rate controlled, S1/S2 + Respiratory: bilateral decreased breath sounds at bases Abdominal: Soft, NT, ND, bowel sounds + Extremities: no edema, no cyanosis    The results of significant diagnostics from this hospitalization (including imaging, microbiology, ancillary and laboratory) are listed below for reference.     Microbiology: Recent Results (from the past 240 hours)  Culture, blood (Routine X 2) w Reflex to ID Panel     Status: None   Collection Time: 04/04/24  8:38 AM   Specimen: Left Antecubital; Blood  Result Value Ref Range Status   Specimen Description   Final    LEFT ANTECUBITAL BOTTLES DRAWN AEROBIC AND ANAEROBIC   Special Requests Blood Culture adequate volume  Final   Culture   Final    NO GROWTH 5  DAYS Performed at [facility], [address] [place], [place] [address]    Report Status 04/09/2024 FINAL  Final  Culture, blood (Routine X 2) w Reflex to ID Panel     Status: None   Collection Time: 04/04/24  8:38 AM   Specimen: BLOOD LEFT HAND  Result Value Ref Range Status   Specimen Description   Final    BLOOD LEFT HAND BOTTLES DRAWN AEROBIC AND ANAEROBIC   Special Requests Blood Culture adequate volume  Final   Culture   Final    NO GROWTH 5 DAYS Performed at [facility], [address], [place], [place] [address]    Report Status 04/09/2024 FINAL  Final  MRSA Next Gen by PCR, Nasal     Status: None   Collection Time: 04/04/24  9:05 AM   Specimen: Nasal Mucosa; Nasal Swab  Result Value Ref Range Status   MRSA by PCR Next Gen NOT DETECTED NOT DETECTED Final    Comment: (NOTE) The GeneXpert MRSA Assay (FDA  approved for NASAL specimens only), is one component of a comprehensive MRSA colonization surveillance program. It is not intended to diagnose MRSA infection nor to guide or monitor treatment for MRSA infections. Test performance is not FDA approved in patients less than 69 years old. Performed at [facility], [address], [place], [place] [address]      Labs: BNP (last 3 results) No results for input(s): BNP in the last 8760 hours. Basic Metabolic Panel: Recent Labs  Lab 04/06/24 0044 04/08/24 0522 04/09/24 0501 04/10/24 0414 04/11/24 0323  NA 137 140 142 141 141  K 5.0 4.6 4.3 4.1 4.7  CL 106 110 109 106 106  CO2 22 28 28 29 29   GLUCOSE 257* 129* 118* 123* 115*  BUN 40* 8 <5* <5* <5*  CREATININE 1.07* 0.73 0.73 0.78 0.72  CALCIUM  7.6* 7.8* 8.4* 8.7* 8.8*  MG 2.0  --   --   --   --    Liver Function Tests: No results for input(s): AST, ALT, ALKPHOS, BILITOT, PROT, ALBUMIN in the last 168 hours. No results for input(s): LIPASE, AMYLASE in the last 168 hours. No results for input(s): AMMONIA in the last 168 hours. CBC: Recent Labs   Lab 04/06/24 0044 04/06/24 1015 04/06/24 2235 04/08/24 1609 04/09/24 0501 04/10/24 0414 04/11/24 0323 04/12/24 0404  WBC 21.4* 23.1*  --   --  13.4* 12.1* 11.4*  --   NEUTROABS 16.4*  --   --   --   --   --   --   --   HGB 5.6* 8.0*   < > 8.3* 8.2* 8.4* 7.7* 8.0*  HCT 16.7* 23.4*   < > 25.1* 25.5* 26.3* 24.1* 25.9*  MCV 90.3 87.0  --   --  90.7 90.7 90.6  --   PLT 158 128*  --   --  154 213 231  --    < > = values in this interval not displayed.   Cardiac Enzymes: No results for input(s): CKTOTAL, CKMB, CKMBINDEX, TROPONINI in the last 168 hours. BNP: Invalid input(s): POCBNP CBG: Recent Labs  Lab 04/11/24 1156 04/11/24 1611 04/11/24 1942 04/12/24 0731 04/12/24 1113  GLUCAP 124* 177* 187* 150* 225*   D-Dimer No results for input(s): DDIMER in the last 72 hours. Hgb A1c No results for input(s): HGBA1C in the last 72 hours. Lipid Profile No results for input(s): CHOL, HDL, LDLCALC, TRIG, CHOLHDL, LDLDIRECT in the last 72 hours. Thyroid  function studies No results for input(s): TSH, T4TOTAL, T3FREE, THYROIDAB in the last 72 hours.  Invalid input(s): FREET3 Anemia work up No results for input(s): VITAMINB12, FOLATE, FERRITIN, TIBC, IRON, RETICCTPCT in the last 72 hours. Urinalysis    Component Value Date/Time   COLORURINE AMBER (A) 04/04/2024 0934   APPEARANCEUR CLOUDY (A) 04/04/2024 0934   LABSPEC 1.019 04/04/2024 0934   PHURINE 5.0 04/04/2024 0934   GLUCOSEU NEGATIVE 04/04/2024 0934   HGBUR LARGE (A) 04/04/2024 0934   BILIRUBINUR NEGATIVE 04/04/2024 0934   KETONESUR NEGATIVE 04/04/2024 0934   PROTEINUR NEGATIVE 04/04/2024 0934   NITRITE NEGATIVE 04/04/2024 0934   LEUKOCYTESUR NEGATIVE 04/04/2024 0934   Sepsis Labs Recent Labs  Lab 04/06/24 1015 04/09/24 0501 04/10/24 0414 04/11/24 0323  WBC 23.1* 13.4* 12.1* 11.4*   Microbiology Recent Results (from the past 240 hours)  Culture, blood (Routine X 2) w  Reflex to ID Panel     Status: None   Collection Time: 04/04/24  8:38 AM   Specimen: Left Antecubital; Blood  Result Value Ref Range Status   Specimen Description   Final    LEFT ANTECUBITAL BOTTLES DRAWN AEROBIC AND ANAEROBIC   Special Requests Blood Culture adequate volume  Final   Culture   Final    NO GROWTH 5 DAYS Performed at [facility], [address] [place], [place] [address]    Report Status 04/09/2024 FINAL  Final  Culture, blood (Routine X 2) w Reflex to ID Panel     Status: None   Collection Time: 04/04/24  8:38 AM   Specimen: BLOOD LEFT HAND  Result Value Ref Range Status   Specimen Description   Final    BLOOD LEFT HAND BOTTLES DRAWN AEROBIC AND ANAEROBIC   Special Requests Blood Culture adequate volume  Final   Culture   Final    NO GROWTH 5 DAYS Performed at [facility], [address], [place], [place] [address]    Report Status 04/09/2024 FINAL  Final  MRSA Next Gen by PCR, Nasal     Status: None   Collection Time: 04/04/24  9:05 AM   Specimen: Nasal Mucosa; Nasal Swab  Result Value Ref Range Status   MRSA by PCR Next Gen NOT DETECTED NOT DETECTED Final    Comment: (NOTE) The GeneXpert MRSA Assay (FDA approved for NASAL specimens only), is one component of a comprehensive MRSA colonization surveillance program. It is not intended to diagnose MRSA infection nor to guide or monitor treatment for MRSA infections. Test performance is not FDA approved in patients less than 73 years old. Performed at [facility], [address], [place], [place] [address]      Time coordinating discharge: 35 minutes  SIGNED:   Derryl Duval, MD  [facility] 04/12/2024, 4:25 PM

## 2024-04-13 ENCOUNTER — Telehealth: Payer: Self-pay | Admitting: Gastroenterology

## 2024-04-13 ENCOUNTER — Encounter (HOSPITAL_COMMUNITY): Payer: Self-pay | Admitting: Gastroenterology

## 2024-04-13 DIAGNOSIS — T189XXA Foreign body of alimentary tract, part unspecified, initial encounter: Secondary | ICD-10-CM

## 2024-04-13 NOTE — Telephone Encounter (Signed)
 Called pt and made aware of below. She has not seen it pass yet but will let us  know if she does. Aware order in for Monday if she has not.

## 2024-04-13 NOTE — Transfer of Care (Signed)
 Immediate Anesthesia Transfer of Care Note  Patient: Casey Johnston  Procedure(s) Performed: EGD (ESOPHAGOGASTRODUODENOSCOPY)  Patient Location: PACU  Anesthesia Type:General  Level of Consciousness: awake and alert   Airway & Oxygen Therapy: Patient Spontanous Breathing and Patient connected to nasal cannula oxygen  Post-op Assessment: Report given to RN and Post -op Vital signs reviewed and stable  Post vital signs: Reviewed and stable  Last Vitals:  Vitals Value Taken Time  BP 153/51 04/12/24 05:12  Temp 36.8 C 04/12/24 05:12  Pulse 73 04/12/24 05:12  Resp 17 04/12/24 11:55  SpO2 99 % 04/12/24 05:12  Vitals shown include unfiled device data.  Last Pain:  Vitals:   04/12/24 0950  TempSrc:   PainSc: 0-No pain      Patients Stated Pain Goal: 0 (04/08/24 0800)  Complications: No notable events documented.

## 2024-04-13 NOTE — Anesthesia Postprocedure Evaluation (Signed)
 Anesthesia Post Note  Patient: Casey Johnston  Procedure(s) Performed: EGD (ESOPHAGOGASTRODUODENOSCOPY)  Patient location during evaluation: PACU Anesthesia Type: MAC Level of consciousness: awake and alert Pain management: pain level controlled Vital Signs Assessment: post-procedure vital signs reviewed and stable Respiratory status: spontaneous breathing, nonlabored ventilation, respiratory function stable and patient connected to nasal cannula oxygen Cardiovascular status: blood pressure returned to baseline and stable Postop Assessment: no apparent nausea or vomiting Anesthetic complications: no   No notable events documented.   Last Vitals:  Vitals:   04/11/24 1940 04/12/24 0512  BP: 130/63 (!) 153/51  Pulse: 70 73  Resp: 16 15  Temp: 36.7 C 36.8 C  SpO2: 100% 99%    Last Pain:  Vitals:   04/12/24 0950  TempSrc:   PainSc: 0-No pain                 [name]

## 2024-04-13 NOTE — Telephone Encounter (Signed)
 Patient had capsule inpatient that was not complete to the cecum. Follow up [facility] 2 days later with capsule still present. Please let patient know I am placing an order for abdominal xray she needs to have done at [facility] next Monday.   If she physically sees the capsule in her commode or in her stool she should call and let us  know.  Charmaine Melia, MSN, APRN, FNP-BC, AGACNP-BC [facility] at [facility]

## 2024-04-13 NOTE — Telephone Encounter (Signed)
 Noted

## 2024-04-20 ENCOUNTER — Encounter (HOSPITAL_COMMUNITY): Payer: Self-pay | Admitting: Internal Medicine

## 2024-04-20 ENCOUNTER — Ambulatory Visit (HOSPITAL_COMMUNITY)
Admission: RE | Admit: 2024-04-20 | Discharge: 2024-04-20 | Disposition: A | Source: Ambulatory Visit | Attending: Gastroenterology

## 2024-04-20 ENCOUNTER — Other Ambulatory Visit: Payer: Self-pay | Admitting: Gastroenterology

## 2024-04-20 DIAGNOSIS — T189XXA Foreign body of alimentary tract, part unspecified, initial encounter: Secondary | ICD-10-CM

## 2024-04-20 DIAGNOSIS — K5909 Other constipation: Secondary | ICD-10-CM | POA: Insufficient documentation

## 2024-04-20 NOTE — Progress Notes (Unsigned)
 GI Office Note    Referring Provider: Teresa Jenkins Jansky, FNP Primary Care Physician:  Casey Jenkins Jansky, FNP Primary Gastroenterologist: Casey Johnston. Cindie, DO  Date:  04/21/2024  ID:  Casey Johnston, DOB February 04, 1961, MRN [id number]  Chief Complaint   Chief Complaint  Patient presents with   Follow-up    Hospital follow up.     History of Present Illness  Casey Johnston is a 63 y.o. female with a history of  GERD, IBS- constipation (with chronic abdominal pain), benign epidermoid cyst in right lower anterior abdominal wall and adenomatous colon polyps presenting today for hospital follow up of IDA.   Last office visit 2/325 with Casey Centers, PA-C. GERD resolved and doing well off pantoprazole . Managing constipation with linzess  as needed and satisfied with Bms. Had some mild hypotension but asymptomatic. Advised to continue linzess  as needed and f/u with pcp for BP management.  Colonoscopy August 2024:  - Non- bleeding internal hemorrhoids.  - Diverticulosis in the sigmoid colon.  - One 4 mm polyp in the sigmoid colon, removed with a cold snare. Resected and retrieved.  - Stool in the entire examined colon.  - The examination was otherwise normal. - Repeat colonoscopy in 5 years for surveillance.  Seen by our team at [facility] for consult 04/04/24 for evaluation of anemia and melena. Received multiple units of PRBCs (9) during hospitalization and underwent multiple procedures as noted below.   EGD 04/05/2024: - Normal esophagus.  - Four non- bleeding angioectasias in the stomach. Treated with argon plasma coagulation ( APC) .  - Three recently bleeding angioectasias in the duodenum. Treated with argon plasma coagulation ( APC) .  - No specimens collected. - If continued drop in HBg or overt bleeding will benefit from small bowel capsule endoscopy + / - Colonoscopy  Colonoscopy 04/07/2024: - The examined portion of the ileum was normal.  - Blood in the entire examined colon.   - Two 2 to 4 mm polyps in the transverse colon, removed with a cold snare. Resected and retrieved. - One 6 mm polyp in the descending colon, removed with a cold snare. Resected and retrieved.  - Diverticulosis in the sigmoid colon and in the ascending colon.  - Non- bleeding internal hemorrhoids. - Path: tubular adenoma, hyperplastic polyps - Proceed with enteroscopy. - Advised repeat in 5 years.   Enteroscopy 04/07/2024: - Normal esophagus.  - Non- bleeding gastric ulcers. These were postablation ulcers. - Non- bleeding duodenal ulcer with a clean ulcer base ( Forrest Class III) . This was postablation ulcer.  - Normal examined jejunum.  - No specimens collected.  CTA bleeding scan (11/19):- No evidence of active GI bleeding.   Givens Capsule 04/10/2024: Study not complete to the cecum and poor prep.  Scattered areas of erythema and linear erosions as well as angiectasias and post ablation ulcers were identified.  There was some hematin present within of the study as well and darkened material through the end which could have possibly been melena.  She subsequently underwent repeat enteroscopy as below.  Enteroscopy 04/11/2024: - Normal esophagus. - Non-bleeding gastric ulcers with a clean ulcer  base (Forrest Class III). - Non-bleeding duodenal ulcer with a clean ulcer base (Forrest Class III). - Three non-bleeding angioectasias in the duodenum. Treated with APC - A single non-bleeding angioectasia in the jejunum. Treated with APC.     Latest Ref Rng & Units 04/12/2024    4:04 AM 04/11/2024    3:23 AM 04/10/2024  4:14 AM  CBC  WBC 4.0 - 10.5 K/uL  11.4  12.1   Hemoglobin 12.0 - 15.0 g/dL 8.0  7.7  8.4   Hematocrit 36.0 - 46.0 % 25.9  24.1  26.3   Platelets 150 - 400 K/uL  231  213    Per my review of KUB 2024/05/09: Capsule has passed.   Today:   Discussed the use of AI scribe software for clinical note transcription with the patient, who gave verbal consent to  proceed.  She has been experiencing increased fatigue since her hospitalization and discharge. It has been approximately a week and a half since her discharge.  During her hospitalization, she underwent enteroscopies and multiple EGD and colonoscopy which revealed additional angiectasias.  She has a history of chronic constipation, typically managed with Linzess  taken twice a week. Recently, she has had three spontaneous bowel movements, which is unusual for her. She has not attempted daily Linzess  due to previous excessive bowel movements.  She is not currently taking any iron supplements. Her chronic acid reflux is managed with pantoprazole , taken twice daily. Although she was off pantoprazole  for two months prior to her last office visit without symptoms, she is now back on it and remains symptom-free.  No new pain, changes in appetite, or other new symptoms are reported.      Wt Readings from Last 6 Encounters:  04/21/24 195 lb (88.5 kg)  04/05/24 198 lb 13.7 oz (90.2 kg)  06/24/23 201 lb 12.8 oz (91.5 kg)  12/24/22 218 lb 14.7 oz (99.3 kg)  12/20/22 218 lb 14.7 oz (99.3 kg)  11/29/22 218 lb 14.7 oz (99.3 kg)    Body mass index is 32.45 kg/m.   Current Outpatient Medications  Medication Sig Dispense Refill   atorvastatin  (LIPITOR) 20 MG tablet TAKE 1 Tablet BY MOUTH ONCE EVERY DAY (Patient taking differently: Take 20 mg by mouth daily. TAKE 1 Tablet BY MOUTH ONCE EVERY DAY) 30 tablet 0   cloNIDine  (CATAPRES ) 0.1 MG tablet Take 1 tablet (0.1 mg total) by mouth 2 (two) times daily. (Patient taking differently: Take 0.1 mg by mouth daily.) 60 tablet 0   Dulaglutide (TRULICITY) 0.75 MG/0.5ML SOPN Inject 0.75 mg into the skin every Monday.     glipiZIDE  (GLUCOTROL  XL) 5 MG 24 hr tablet Take 1 tablet (5 mg total) by mouth daily with breakfast. 90 tablet 1   linaclotide  (LINZESS ) 72 MCG capsule Take 72 mcg by mouth daily as needed (constipation).     losartan -hydrochlorothiazide   (HYZAAR) 100-12.5 MG tablet Take 1 tablet by mouth daily.     metFORMIN  (GLUCOPHAGE ) 1000 MG tablet Take 1 tablet (1,000 mg total) by mouth 2 (two) times daily with a meal. 60 tablet 0   pantoprazole  (PROTONIX ) 40 MG tablet Take 1 tablet (40 mg total) by mouth 2 (two) times daily before a meal. 60 tablet 1   No current facility-administered medications for this visit.    Past Medical History:  Diagnosis Date   Anxiety and depression    Chronic constipation    Diabetes mellitus, type II (HCC)    Dr. Lenis managing   Frequent headaches chronic   ibuprofen helps some   GERD (gastroesophageal reflux disease)    History of UTI    Recurrent per pt (approx 3 per year)   Hyperlipidemia    Hypertension    Leukocytosis 12/2015   mild lymphocytosis.  Path smear review reassuring.  Repeat 01/2016 stable.   Morbid obesity (HCC)  OSA on CPAP    not using CPAP anymore    Past Surgical History:  Procedure Laterality Date   ABDOMINAL HYSTERECTOMY  1998   Dysfunctional uterine bleeding.  No hx of abnormal paps.   BIOPSY  10/20/2018   Procedure: BIOPSY;  Surgeon: Harvey Margo CROME, MD;  [facility];  Service: Endoscopy;;  stomach   CATARACT EXTRACTION W/PHACO Left 08/24/2022   Procedure: CATARACT EXTRACTION PHACO AND INTRAOCULAR LENS PLACEMENT (IOC);  Surgeon: Harrie Agent, MD;  Location: [facility];  Service: Ophthalmology;  Laterality: Left;  CDE 8.53   CATARACT EXTRACTION W/PHACO Right 09/07/2022   Procedure: CATARACT EXTRACTION PHACO AND INTRAOCULAR LENS PLACEMENT (IOC);  Surgeon: Harrie Agent, MD;  Location: [facility];  Service: Ophthalmology;  Laterality: Right;  CDE: 10.02   COLONOSCOPY  2012   Dr. Gaylia: 1.5 cm pendunculated descending colon polyp (tubulovillous adenoma) and diminutive hyperplastic polyps   COLONOSCOPY N/A 10/20/2018   Procedure: COLONOSCOPY;  Surgeon: Harvey Margo CROME, MD; 4 tubular adenomas, diverticulosis in the sigmoid colon, tortuous left colon, external and  internal hemorrhoids. Due for repeat in 2023.    COLONOSCOPY N/A 04/07/2024   Procedure: COLONOSCOPY;  Surgeon: Eartha Angelia Sieving, MD;  [facility];  Service: Gastroenterology;  Laterality: N/A;   COLONOSCOPY WITH PROPOFOL  N/A 12/24/2022   Procedure: COLONOSCOPY WITH PROPOFOL ;  Surgeon: Casey Johnston Casey POUR, DO;  [facility];  Service: Endoscopy;  Laterality: N/A;  11:15 am, asa 3   ENTEROSCOPY N/A 04/11/2024   Procedure: ENTEROSCOPY;  Surgeon: Casey Johnston Casey POUR, DO;  [facility];  Service: Endoscopy;  Laterality: N/A;   ESOPHAGOGASTRODUODENOSCOPY N/A 10/20/2018   Procedure: ESOPHAGOGASTRODUODENOSCOPY (EGD);  Surgeon: Harvey Margo CROME, MD;  gastritis and duodenitis. No H. Pylori.    ESOPHAGOGASTRODUODENOSCOPY N/A 04/07/2024   Procedure: EGD (ESOPHAGOGASTRODUODENOSCOPY);  Surgeon: Eartha Angelia, Sieving, MD;  [facility];  Service: Gastroenterology;  Laterality: N/A;  WITH ENTEROSCOPY   ESOPHAGOGASTRODUODENOSCOPY N/A 04/05/2024   Procedure: EGD (ESOPHAGOGASTRODUODENOSCOPY);  Surgeon: Cinderella Deatrice FALCON, MD;  [facility];  Service: Endoscopy;  Laterality: N/A;   GIVENS CAPSULE STUDY N/A 04/09/2024   Procedure: IMAGING PROCEDURE, GI TRACT, INTRALUMINAL, VIA CAPSULE;  Surgeon: Cinderella Deatrice FALCON, MD;  [facility];  Service: Endoscopy;  Laterality: N/A;   POLYPECTOMY  10/20/2018   Procedure: POLYPECTOMY;  Surgeon: Harvey Margo CROME, MD;  [facility];  Service: Endoscopy;;   POLYPECTOMY  12/24/2022   Procedure: POLYPECTOMY INTESTINAL;  Surgeon: Casey Johnston Casey POUR, DO;  [facility];  Service: Endoscopy;;   wart removal  08/04/2019   benign    Family History  Problem Relation Age of Onset   Cancer Mother        ovarian   COPD Father    Diabetes Father    Hyperlipidemia Father    Heart attack Father    Heart failure Father    Colon cancer Neg Hx     Allergies as of 04/21/2024 - Review Complete  04/21/2024  Allergen Reaction Noted   Januvia  [sitagliptin ] Other (See Comments) 05/21/2018   Amlodipine  Swelling 10/19/2020    Social History   Socioeconomic History   Marital status: Divorced    Spouse name: Not on file   Number of children: Not on file   Years of education: Not on file   Highest education level: Not on file  Occupational History   Not on file  Tobacco Use   Smoking status: Every Day  Current packs/day: 0.25    Average packs/day: 0.3 packs/day for 20.0 years (5.0 ttl pk-yrs)    Types: Cigarettes   Smokeless tobacco: Never  Vaping Use   Vaping status: Never Used  Substance and Sexual Activity   Alcohol use: No   Drug use: No   Sexual activity: Not on file  Other Topics Concern   Not on file  Social History Narrative   Separated x 20 yrs, one daughter.  She lives with her.  Great niece and nephew live with her as well.   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   Tob: 7 pack year hx, quit 2010.   Alc: none   Exercise: none   Social Drivers of Corporate Investment Banker Strain: Low Risk (03/26/2023)   Received from [facility]   Overall Financial Resource Strain (CARDIA)    Difficulty of Paying Living Expenses: Not hard at all  Food Insecurity: No Food Insecurity (04/04/2024)   Hunger Vital Sign    Worried About Running Out of Food in the Last Year: Never true    Ran Out of Food in the Last Year: Never true  Transportation Needs: No Transportation Needs (04/04/2024)   PRAPARE - Administrator, Civil Service (Medical): No    Lack of Transportation (Non-Medical): No  Physical Activity: Sufficiently Active (03/26/2023)   Received from [facility]   Exercise Vital Sign    On average, how many days per week do you engage in moderate to strenuous exercise (like a brisk walk)?: 6 days    On average, how many minutes do you engage in exercise at this level?: 30 min  Stress: Stress Concern Present (03/26/2023)   Received from [facility] of Occupational Health - Occupational Stress Questionnaire    Feeling of Stress : To some extent  Social Connections: Moderately Isolated (03/26/2023)   Received from [facility]   Social Connection and Isolation Panel    In a typical week, how many times do you talk on the phone with family, friends, or neighbors?: Once a week    How often do you get together with friends or relatives?: Three times a week    How often do you attend church or religious services?: More than 4 times per year    Do you belong to any clubs or organizations such as church groups, unions, fraternal or athletic groups, or school groups?: No    How often do you attend meetings of the clubs or organizations you belong to?: Never    Are you married, widowed, divorced, separated, never married, or living with a partner?: Divorced    Review of Systems   Gen: + fatigue. + weight loss. Denies fever, chills, anorexia. Denies weakness. CV: Denies chest pain, palpitations, syncope, peripheral edema, and claudication. Resp: Denies dyspnea at rest, cough, wheezing, coughing up blood, and pleurisy. GI: See HPI Derm: Denies rash, itching, dry skin Psych: Denies depression, anxiety, memory loss, confusion. No homicidal or suicidal ideation.  Heme: Denies bruising, bleeding, and enlarged lymph nodes.  Physical Exam   BP (!) 139/57 (BP Location: Right Arm, Patient Position: Sitting, Cuff Size: Normal)   Pulse 88   Temp 98.2 F (36.8 C) (Temporal)   Ht 5' 5 (1.651 m)   Wt 195 lb (88.5 kg)   BMI 32.45 kg/m   General:   Alert and oriented. No distress noted. Pleasant and cooperative.  Head:  Normocephalic and atraumatic. Eyes:  Conjuctiva clear without scleral icterus. Left eye wondering to left(dysconjugate) Mouth:  Oral mucosa pink and moist. Good dentition. No lesions. Abdomen:  +BS, soft, non-tender and non-distended. No rebound or guarding. No HSM or masses noted. Rectal: deferred Msk:   Symmetrical without gross deformities. Normal posture. Extremities:  Without edema. Neurologic:  Alert and  oriented x4 Psych:  Alert and cooperative. Normal mood and affect.  Assessment & Plan  VIRGILIA QUIGG is a 63 y.o. female presenting today for hospital follow up of anemia.     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 Recent hospitalization. Hemoglobin levels were low but stable at discharge. No current signs of active bleeding such as hematochezia or melena. Appetite is slow to return, but no new symptoms reported. Capsule study previously conducted, but capsule study not complete and spent prolonged time in the stomach. Radiology images suggest capsule has passed now.  - Will recheck hemoglobin and iron levels in three weeks. - Will consider repeat capsule study via upper endoscopy placement if hemoglobin levels do not improve. If distal additional AVMs found then will need to consider referral to [facility] for double balloon enteroscopy.  - Will initiate iron supplementation if iron levels are low. - Advised to report any return of blood or black stool immediately as well as shortness of breath and other signs of worsening anemia.  - Continue PPI BID - Avoid NSAIDs  Chronic constipation Managed with Linzess  72mcg, taken twice weekly. No current pain or bloating reported. Discussed the option of increasing Linzess  frequency if tolerated. No fiber supplementation currently in use. Discussed the benefits of fiber supplementation for colon health and regularity. - Consider increasing Linzess  frequency if tolerated. - Initiate fiber supplementation with psyllium (Metamucil or equivalent) once daily, with potential to increase frequency based on tolerance. - Monitor for any changes in bowel habits or symptoms.  Gastroesophageal reflux  disease Gastroesophageal reflux disease, previously managed with pantoprazole . Currently on pantoprazole  twice daily with no breakthrough symptoms reported. - Continue pantoprazole  40 mg twice daily.     Follow up   Follow up 3 months, labs in 3 weeks.     Charmaine Melia, MSN, FNP-BC, AGACNP-BC [facility]

## 2024-04-20 NOTE — Progress Notes (Signed)
 Good morning, The original CPT codes 54614 and [phone] were correct, as I performed EGD and colonoscopy on 04/07/2024. The enteroscopy code is NOT correct (55639), as I did not perform this, but this was my colleague Dr. Carlin Hasty( performed on Apr 12, 2024). Codes should remain as the original ones. Thanks

## 2024-04-21 ENCOUNTER — Ambulatory Visit: Admitting: Gastroenterology

## 2024-04-21 ENCOUNTER — Encounter: Payer: Self-pay | Admitting: Gastroenterology

## 2024-04-21 VITALS — BP 139/57 | HR 88 | Temp 98.2°F | Ht 65.0 in | Wt 195.0 lb

## 2024-04-21 DIAGNOSIS — K31819 Angiodysplasia of stomach and duodenum without bleeding: Secondary | ICD-10-CM | POA: Diagnosis not present

## 2024-04-21 DIAGNOSIS — D500 Iron deficiency anemia secondary to blood loss (chronic): Secondary | ICD-10-CM

## 2024-04-21 DIAGNOSIS — K219 Gastro-esophageal reflux disease without esophagitis: Secondary | ICD-10-CM

## 2024-04-21 DIAGNOSIS — K5909 Other constipation: Secondary | ICD-10-CM | POA: Diagnosis not present

## 2024-04-21 NOTE — Patient Instructions (Addendum)
 Continue taking your pantoprazole  40 mg twice daily.  Follow a GERD diet:  Avoid fried, fatty, greasy, spicy, citrus foods. Avoid caffeine and carbonated beverages. Avoid chocolate. Try eating 4-6 small meals a day rather than 3 large meals. Do not eat within 3 hours of laying down. Prop head of bed up on wood or bricks to create a 6 inch incline.  Continue taking Linzess  72 mcg daily as needed.  If a couple times a week right now is helping you stay regular and not having any abdominal pain or significant bloating then you can continue this regimen however if you would like to have bowel movements more frequently you can give taking Linzess  daily another try to see if you have significant diarrhea.  Please keep an eye out for worsening fatigue, shortness of breath, or return of melena (black tarry stool) or bright red blood in your stool.  We will recheck your hemoglobin level and your iron levels in 3 weeks.  If there is any decline in your levels then we will need to consider repeating an upper endoscopy and placing another capsule study to determine if there are any angiectasias that could be bleeding further into the small bowel.   We will plan to tentatively follow-up in 3 months.  I hope you have a Gertha Christmas and having a year!  It was a pleasure to see you today. I want to create trusting relationships with patients. If you receive a survey regarding your visit,  I greatly appreciate you taking time to fill this out on paper or through your MyChart. I value your feedback.  Charmaine Melia, MSN, FNP-BC, AGACNP-BC [facility]

## 2024-04-21 NOTE — Progress Notes (Signed)
 Good morning, You are actually right. Please change CPT to 44360  Thanks

## 2024-04-26 ENCOUNTER — Ambulatory Visit: Payer: Self-pay | Admitting: Gastroenterology

## 2024-05-06 ENCOUNTER — Other Ambulatory Visit: Payer: Self-pay

## 2024-05-06 ENCOUNTER — Inpatient Hospital Stay (HOSPITAL_COMMUNITY)
Admission: EM | Admit: 2024-05-06 | Discharge: 2024-05-24 | DRG: 329 | Disposition: A | Discharge status: Home / Self Care | Attending: Internal Medicine | Admitting: Internal Medicine

## 2024-05-06 ENCOUNTER — Encounter (HOSPITAL_COMMUNITY): Payer: Self-pay

## 2024-05-06 ENCOUNTER — Telehealth: Payer: Self-pay | Admitting: *Deleted

## 2024-05-06 DIAGNOSIS — E440 Moderate protein-calorie malnutrition: Secondary | ICD-10-CM | POA: Diagnosis present

## 2024-05-06 DIAGNOSIS — D638 Anemia in other chronic diseases classified elsewhere: Secondary | ICD-10-CM | POA: Diagnosis present

## 2024-05-06 DIAGNOSIS — D62 Acute posthemorrhagic anemia: Secondary | ICD-10-CM | POA: Diagnosis present

## 2024-05-06 DIAGNOSIS — K648 Other hemorrhoids: Secondary | ICD-10-CM | POA: Diagnosis present

## 2024-05-06 DIAGNOSIS — G4733 Obstructive sleep apnea (adult) (pediatric): Secondary | ICD-10-CM | POA: Diagnosis present

## 2024-05-06 DIAGNOSIS — D696 Thrombocytopenia, unspecified: Secondary | ICD-10-CM | POA: Diagnosis not present

## 2024-05-06 DIAGNOSIS — Z888 Allergy status to other drugs, medicaments and biological substances status: Secondary | ICD-10-CM

## 2024-05-06 DIAGNOSIS — K5731 Diverticulosis of large intestine without perforation or abscess with bleeding: Secondary | ICD-10-CM | POA: Diagnosis present

## 2024-05-06 DIAGNOSIS — E8809 Other disorders of plasma-protein metabolism, not elsewhere classified: Secondary | ICD-10-CM | POA: Diagnosis present

## 2024-05-06 DIAGNOSIS — E860 Dehydration: Secondary | ICD-10-CM | POA: Diagnosis present

## 2024-05-06 DIAGNOSIS — D72829 Elevated white blood cell count, unspecified: Secondary | ICD-10-CM | POA: Diagnosis not present

## 2024-05-06 DIAGNOSIS — D500 Iron deficiency anemia secondary to blood loss (chronic): Secondary | ICD-10-CM | POA: Diagnosis not present

## 2024-05-06 DIAGNOSIS — Y848 Other medical procedures as the cause of abnormal reaction of the patient, or of later complication, without mention of misadventure at the time of the procedure: Secondary | ICD-10-CM | POA: Diagnosis present

## 2024-05-06 DIAGNOSIS — K31819 Angiodysplasia of stomach and duodenum without bleeding: Secondary | ICD-10-CM | POA: Diagnosis present

## 2024-05-06 DIAGNOSIS — K921 Melena: Secondary | ICD-10-CM | POA: Diagnosis not present

## 2024-05-06 DIAGNOSIS — E119 Type 2 diabetes mellitus without complications: Secondary | ICD-10-CM | POA: Diagnosis not present

## 2024-05-06 DIAGNOSIS — Z6832 Body mass index (BMI) 32.0-32.9, adult: Secondary | ICD-10-CM

## 2024-05-06 DIAGNOSIS — Z90710 Acquired absence of both cervix and uterus: Secondary | ICD-10-CM

## 2024-05-06 DIAGNOSIS — N179 Acute kidney failure, unspecified: Secondary | ICD-10-CM

## 2024-05-06 DIAGNOSIS — F419 Anxiety disorder, unspecified: Secondary | ICD-10-CM | POA: Diagnosis present

## 2024-05-06 DIAGNOSIS — E871 Hypo-osmolality and hyponatremia: Secondary | ICD-10-CM | POA: Diagnosis present

## 2024-05-06 DIAGNOSIS — K625 Hemorrhage of anus and rectum: Secondary | ICD-10-CM | POA: Diagnosis not present

## 2024-05-06 DIAGNOSIS — K219 Gastro-esophageal reflux disease without esophagitis: Secondary | ICD-10-CM | POA: Diagnosis present

## 2024-05-06 DIAGNOSIS — Z83438 Family history of other disorder of lipoprotein metabolism and other lipidemia: Secondary | ICD-10-CM

## 2024-05-06 DIAGNOSIS — F418 Other specified anxiety disorders: Secondary | ICD-10-CM | POA: Diagnosis not present

## 2024-05-06 DIAGNOSIS — T80818A Extravasation of other vesicant agent, initial encounter: Secondary | ICD-10-CM | POA: Diagnosis present

## 2024-05-06 DIAGNOSIS — Z833 Family history of diabetes mellitus: Secondary | ICD-10-CM

## 2024-05-06 DIAGNOSIS — E785 Hyperlipidemia, unspecified: Secondary | ICD-10-CM | POA: Diagnosis present

## 2024-05-06 DIAGNOSIS — K922 Gastrointestinal hemorrhage, unspecified: Principal | ICD-10-CM | POA: Diagnosis present

## 2024-05-06 DIAGNOSIS — I10 Essential (primary) hypertension: Secondary | ICD-10-CM | POA: Diagnosis present

## 2024-05-06 DIAGNOSIS — E875 Hyperkalemia: Secondary | ICD-10-CM | POA: Diagnosis present

## 2024-05-06 DIAGNOSIS — Z825 Family history of asthma and other chronic lower respiratory diseases: Secondary | ICD-10-CM

## 2024-05-06 DIAGNOSIS — R578 Other shock: Secondary | ICD-10-CM | POA: Diagnosis present

## 2024-05-06 DIAGNOSIS — Z7984 Long term (current) use of oral hypoglycemic drugs: Secondary | ICD-10-CM

## 2024-05-06 DIAGNOSIS — D649 Anemia, unspecified: Secondary | ICD-10-CM | POA: Diagnosis not present

## 2024-05-06 DIAGNOSIS — F17210 Nicotine dependence, cigarettes, uncomplicated: Secondary | ICD-10-CM | POA: Diagnosis present

## 2024-05-06 DIAGNOSIS — E66811 Obesity, class 1: Secondary | ICD-10-CM | POA: Diagnosis present

## 2024-05-06 DIAGNOSIS — E861 Hypovolemia: Secondary | ICD-10-CM | POA: Diagnosis present

## 2024-05-06 DIAGNOSIS — Z8249 Family history of ischemic heart disease and other diseases of the circulatory system: Secondary | ICD-10-CM

## 2024-05-06 DIAGNOSIS — E559 Vitamin D deficiency, unspecified: Secondary | ICD-10-CM | POA: Diagnosis present

## 2024-05-06 DIAGNOSIS — Z8711 Personal history of peptic ulcer disease: Secondary | ICD-10-CM

## 2024-05-06 DIAGNOSIS — K5730 Diverticulosis of large intestine without perforation or abscess without bleeding: Secondary | ICD-10-CM | POA: Diagnosis not present

## 2024-05-06 DIAGNOSIS — G4730 Sleep apnea, unspecified: Secondary | ICD-10-CM | POA: Diagnosis not present

## 2024-05-06 DIAGNOSIS — K5521 Angiodysplasia of colon with hemorrhage: Principal | ICD-10-CM | POA: Diagnosis present

## 2024-05-06 DIAGNOSIS — Z79899 Other long term (current) drug therapy: Secondary | ICD-10-CM

## 2024-05-06 DIAGNOSIS — R109 Unspecified abdominal pain: Secondary | ICD-10-CM | POA: Diagnosis not present

## 2024-05-06 DIAGNOSIS — E1165 Type 2 diabetes mellitus with hyperglycemia: Secondary | ICD-10-CM | POA: Diagnosis present

## 2024-05-06 DIAGNOSIS — K5520 Angiodysplasia of colon without hemorrhage: Secondary | ICD-10-CM | POA: Diagnosis not present

## 2024-05-06 DIAGNOSIS — K5909 Other constipation: Secondary | ICD-10-CM | POA: Diagnosis present

## 2024-05-06 DIAGNOSIS — K633 Ulcer of intestine: Secondary | ICD-10-CM | POA: Diagnosis present

## 2024-05-06 DIAGNOSIS — Z7985 Long-term (current) use of injectable non-insulin antidiabetic drugs: Secondary | ICD-10-CM

## 2024-05-06 LAB — PREPARE RBC (CROSSMATCH)

## 2024-05-06 LAB — COMPREHENSIVE METABOLIC PANEL WITH GFR
ALT: 13 U/L (ref 0–44)
AST: 24 U/L (ref 15–41)
Albumin: 3.9 g/dL (ref 3.5–5.0)
Alkaline Phosphatase: 81 U/L (ref 38–126)
Anion gap: 16 — ABNORMAL HIGH (ref 5–15)
BUN: 33 mg/dL — ABNORMAL HIGH (ref 8–23)
CO2: 20 mmol/L — ABNORMAL LOW (ref 22–32)
Calcium: 9.6 mg/dL (ref 8.9–10.3)
Chloride: 104 mmol/L (ref 98–111)
Creatinine, Ser: 1.3 mg/dL — ABNORMAL HIGH (ref 0.44–1.00)
GFR, Estimated: 46 mL/min — ABNORMAL LOW (ref >60)
Glucose, Bld: 98 mg/dL (ref 70–99)
Potassium: 5.2 mmol/L — ABNORMAL HIGH (ref 3.5–5.1)
Sodium: 140 mmol/L (ref 135–145)
Total Bilirubin: 0.3 mg/dL (ref 0.0–1.2)
Total Protein: 7.2 g/dL (ref 6.5–8.1)

## 2024-05-06 LAB — CBC WITH DIFFERENTIAL/PLATELET
Abs Immature Granulocytes: 0.12 K/uL — ABNORMAL HIGH (ref 0.00–0.07)
Basophils Absolute: 0.1 K/uL (ref 0.0–0.1)
Basophils Relative: 1 %
Eosinophils Absolute: 0.1 K/uL (ref 0.0–0.5)
Eosinophils Relative: 0 %
HCT: 25.2 % — ABNORMAL LOW (ref 36.0–46.0)
Hemoglobin: 7.8 g/dL — ABNORMAL LOW (ref 12.0–15.0)
Immature Granulocytes: 1 %
Lymphocytes Relative: 28 %
Lymphs Abs: 4.1 K/uL — ABNORMAL HIGH (ref 0.7–4.0)
MCH: 27.9 pg (ref 26.0–34.0)
MCHC: 31 g/dL (ref 30.0–36.0)
MCV: 90 fL (ref 80.0–100.0)
Monocytes Absolute: 0.6 K/uL (ref 0.1–1.0)
Monocytes Relative: 4 %
Neutro Abs: 9.5 K/uL — ABNORMAL HIGH (ref 1.7–7.7)
Neutrophils Relative %: 66 %
Platelets: 300 K/uL (ref 150–400)
RBC: 2.8 MIL/uL — ABNORMAL LOW (ref 3.87–5.11)
RDW: 16.4 % — ABNORMAL HIGH (ref 11.5–15.5)
WBC: 14.4 K/uL — ABNORMAL HIGH (ref 4.0–10.5)
nRBC: 0 % (ref 0.0–0.2)

## 2024-05-06 LAB — RETICULOCYTES
Immature Retic Fract: 33.6 % — ABNORMAL HIGH (ref 2.3–15.9)
RBC.: 2.75 MIL/uL — ABNORMAL LOW (ref 3.87–5.11)
Retic Count, Absolute: 104.4 K/uL (ref 19.0–186.0)
Retic Ct Pct: 3.8 % — ABNORMAL HIGH (ref 0.4–3.1)

## 2024-05-06 LAB — VITAMIN B12: Vitamin B-12: 561 pg/mL (ref 180–914)

## 2024-05-06 LAB — GLUCOSE, CAPILLARY: Glucose-Capillary: 124 mg/dL — ABNORMAL HIGH (ref 70–99)

## 2024-05-06 LAB — IRON AND TIBC
Iron: 107 ug/dL (ref 28–170)
Saturation Ratios: 27 % (ref 10.4–31.8)
TIBC: 400 ug/dL (ref 250–450)
UIBC: 293 ug/dL

## 2024-05-06 LAB — POC OCCULT BLOOD, ED: Fecal Occult Bld: POSITIVE — AB

## 2024-05-06 LAB — FERRITIN: Ferritin: 19 ng/mL (ref 11–307)

## 2024-05-06 MED ORDER — CLONIDINE HCL 0.1 MG PO TABS: 0.1000 mg | ORAL_TABLET | Freq: Every day | ORAL | Status: DC

## 2024-05-06 MED ORDER — TRAZODONE HCL 50 MG PO TABS
50.0000 mg | ORAL_TABLET | Freq: Every evening | ORAL | Status: AC | PRN
  Administered 2024-05-06 – 2024-05-24 (×7): 50 mg via ORAL
  Filled 2024-05-06 (×4): qty 1

## 2024-05-06 MED ORDER — PANTOPRAZOLE SODIUM 40 MG IV SOLR
40.0000 mg | Freq: Once | INTRAVENOUS | Status: AC
  Administered 2024-05-06: 13:00:00 40 mg via INTRAVENOUS
  Filled 2024-05-06: qty 10

## 2024-05-06 MED ORDER — SODIUM CHLORIDE 0.9% FLUSH: 3.0000 mL | INTRAVENOUS | Status: DC | PRN

## 2024-05-06 MED ORDER — SODIUM CHLORIDE 0.9% FLUSH
3.0000 mL | Freq: Two times a day (BID) | INTRAVENOUS | Status: DC
  Administered 2024-05-06 – 2024-05-24 (×31): 3 mL via INTRAVENOUS

## 2024-05-06 MED ORDER — ACETAMINOPHEN 325 MG PO TABS
650.0000 mg | ORAL_TABLET | Freq: Four times a day (QID) | ORAL | Status: DC | PRN
  Administered 2024-05-08 – 2024-05-24 (×4): 650 mg via ORAL
  Filled 2024-05-06 (×2): qty 2

## 2024-05-06 MED ORDER — SODIUM CHLORIDE 0.9 % IV BOLUS
500.0000 mL | Freq: Once | INTRAVENOUS | Status: AC
  Administered 2024-05-06: 13:00:00 500 mL via INTRAVENOUS

## 2024-05-06 MED ORDER — IPRATROPIUM-ALBUTEROL 0.5-2.5 (3) MG/3ML IN SOLN
3.0000 mL | Freq: Once | RESPIRATORY_TRACT | Status: AC
  Administered 2024-05-06: 15:00:00 3 mL via RESPIRATORY_TRACT
  Filled 2024-05-06: qty 3

## 2024-05-06 MED ORDER — ONDANSETRON HCL 4 MG PO TABS: 4.0000 mg | ORAL_TABLET | Freq: Four times a day (QID) | ORAL | Status: AC | PRN

## 2024-05-06 MED ORDER — PANTOPRAZOLE SODIUM 40 MG IV SOLR
40.0000 mg | Freq: Two times a day (BID) | INTRAVENOUS | Status: DC
  Administered 2024-05-06 – 2024-05-07 (×3): 40 mg via INTRAVENOUS
  Filled 2024-05-06 (×3): qty 10

## 2024-05-06 MED ORDER — ACETAMINOPHEN 650 MG RE SUPP: 650.0000 mg | Freq: Four times a day (QID) | RECTAL | Status: DC | PRN

## 2024-05-06 MED ORDER — SODIUM CHLORIDE 0.9 % IV SOLN: INTRAVENOUS | Status: AC

## 2024-05-06 MED ORDER — SODIUM CHLORIDE 0.9 % IV SOLN: 250.0000 mL | INTRAVENOUS | Status: AC | PRN

## 2024-05-06 MED ORDER — HYDROCODONE-ACETAMINOPHEN 5-325 MG PO TABS
1.0000 | ORAL_TABLET | ORAL | Status: DC | PRN
  Administered 2024-05-09 – 2024-05-10 (×2): 1 via ORAL
  Administered 2024-05-12: 2 via ORAL
  Administered 2024-05-13 – 2024-05-17 (×6): 1 via ORAL
  Filled 2024-05-06 (×3): qty 1
  Filled 2024-05-06: qty 2
  Filled 2024-05-06 (×5): qty 1

## 2024-05-06 MED ORDER — INSULIN ASPART 100 UNIT/ML IJ SOLN
0.0000 [IU] | Freq: Three times a day (TID) | INTRAMUSCULAR | Status: AC
  Administered 2024-05-07: 11:00:00 3 [IU] via SUBCUTANEOUS
  Administered 2024-05-07: 10:00:00 5 [IU] via SUBCUTANEOUS
  Administered 2024-05-07: 17:00:00 3 [IU] via SUBCUTANEOUS
  Administered 2024-05-08: 5 [IU] via SUBCUTANEOUS
  Administered 2024-05-09: 8 [IU] via SUBCUTANEOUS
  Administered 2024-05-09: 5 [IU] via SUBCUTANEOUS
  Administered 2024-05-09 – 2024-05-10 (×4): 3 [IU] via SUBCUTANEOUS
  Administered 2024-05-11: 2 [IU] via SUBCUTANEOUS
  Administered 2024-05-11: 3 [IU] via SUBCUTANEOUS
  Administered 2024-05-11: 2 [IU] via SUBCUTANEOUS
  Administered 2024-05-12: 5 [IU] via SUBCUTANEOUS
  Administered 2024-05-12: 3 [IU] via SUBCUTANEOUS
  Administered 2024-05-13: 5 [IU] via SUBCUTANEOUS
  Administered 2024-05-13: 3 [IU] via SUBCUTANEOUS
  Administered 2024-05-13 – 2024-05-14 (×3): 5 [IU] via SUBCUTANEOUS
  Administered 2024-05-15 (×2): 8 [IU] via SUBCUTANEOUS
  Administered 2024-05-15: 5 [IU] via SUBCUTANEOUS
  Administered 2024-05-16: 3 [IU] via SUBCUTANEOUS
  Administered 2024-05-16: 5 [IU] via SUBCUTANEOUS
  Administered 2024-05-17: 3 [IU] via SUBCUTANEOUS
  Administered 2024-05-18: 2 [IU] via SUBCUTANEOUS
  Administered 2024-05-18: 5 [IU] via SUBCUTANEOUS
  Administered 2024-05-18: 3 [IU] via SUBCUTANEOUS
  Administered 2024-05-19: 2 [IU] via SUBCUTANEOUS
  Administered 2024-05-19 – 2024-05-21 (×2): 3 [IU] via SUBCUTANEOUS
  Administered 2024-05-21: 5 [IU] via SUBCUTANEOUS
  Administered 2024-05-21 – 2024-05-23 (×5): 3 [IU] via SUBCUTANEOUS
  Administered 2024-05-23 – 2024-05-24 (×2): 2 [IU] via SUBCUTANEOUS
  Administered 2024-05-24: 3 [IU] via SUBCUTANEOUS
  Filled 2024-05-06: qty 5
  Filled 2024-05-06: qty 2
  Filled 2024-05-06: qty 5
  Filled 2024-05-06 (×2): qty 3
  Filled 2024-05-06: qty 1
  Filled 2024-05-06: qty 2
  Filled 2024-05-06 (×2): qty 5
  Filled 2024-05-06 (×2): qty 3
  Filled 2024-05-06: qty 8
  Filled 2024-05-06: qty 4
  Filled 2024-05-06: qty 1
  Filled 2024-05-06 (×3): qty 3
  Filled 2024-05-06: qty 1
  Filled 2024-05-06: qty 5
  Filled 2024-05-06: qty 1
  Filled 2024-05-06: qty 5
  Filled 2024-05-06: qty 8
  Filled 2024-05-06: qty 3
  Filled 2024-05-06: qty 1
  Filled 2024-05-06: qty 3

## 2024-05-06 MED ORDER — SODIUM CHLORIDE 0.9% IV SOLUTION: Freq: Once | INTRAVENOUS | Status: AC

## 2024-05-06 MED ORDER — ONDANSETRON HCL 4 MG/2ML IJ SOLN
4.0000 mg | Freq: Four times a day (QID) | INTRAMUSCULAR | Status: AC | PRN
  Administered 2024-05-08 – 2024-05-20 (×5): 4 mg via INTRAVENOUS
  Filled 2024-05-06 (×5): qty 2

## 2024-05-06 MED ORDER — HYDRALAZINE HCL 20 MG/ML IJ SOLN: 10.0000 mg | INTRAMUSCULAR | Status: DC | PRN

## 2024-05-06 MED ORDER — INSULIN ASPART 100 UNIT/ML IJ SOLN
0.0000 [IU] | Freq: Every day | INTRAMUSCULAR | Status: AC
  Administered 2024-05-08: 4 [IU] via SUBCUTANEOUS
  Administered 2024-05-13 – 2024-05-15 (×2): 3 [IU] via SUBCUTANEOUS
  Administered 2024-05-16: 0 [IU] via SUBCUTANEOUS
  Administered 2024-05-17 – 2024-05-21 (×3): 2 [IU] via SUBCUTANEOUS
  Administered 2024-05-23: 5 [IU] via SUBCUTANEOUS
  Filled 2024-05-06: qty 3
  Filled 2024-05-06: qty 4
  Filled 2024-05-06 (×2): qty 3

## 2024-05-06 MED ORDER — ATORVASTATIN CALCIUM 10 MG PO TABS
20.0000 mg | ORAL_TABLET | Freq: Every day | ORAL | Status: DC
  Administered 2024-05-06 – 2024-05-24 (×12): 20 mg via ORAL
  Filled 2024-05-06 (×8): qty 1

## 2024-05-06 MED ORDER — ONDANSETRON HCL 4 MG/2ML IJ SOLN
4.0000 mg | Freq: Once | INTRAMUSCULAR | Status: AC
  Administered 2024-05-06: 14:00:00 4 mg via INTRAVENOUS
  Filled 2024-05-06: qty 2

## 2024-05-06 NOTE — Consult Note (Signed)
 Gastroenterology Consult   Referring Provider: No ref. provider found Primary Care Physician:  Teresa Jenkins Jansky, FNP Primary Gastroenterologist:  Dr.Carver   Patient ID: Casey Johnston; [id number]; Mar 04, 1961   Admit date: 05/06/2024  LOS: 0 days   Date of Consultation: 05/06/2024  Reason for Consultation:  rectal bleeding   History of Present Illness   Casey Johnston is a 63 y.o. year old female with history of type 2 diabetes, hyperlipidemia, hypertension, angiectasia's of the GI tract, GERD who presented to the ED for blood in stools x2 days. Taking protonix  compliantly, GI consulted for further evaluation  ED course: BP 91/37-112/48 Afebrile HR 70-78   Hgb 7.8, 8 on 11/23 WBC 14.4.  Potassium 5.2  BUN 33 creat 1.3 Heme positive stool  Consult: Onset of BRBPR that has since transitioned to darker red/black coloration mixed in with stool since Monday. Having nausea and some lower abdominal discomfort but denies abdominal pain. She has looser stools but this is not new for her. Denies constipation. No appetite changes or weight loss. Denies NSAIDs. Taking PPI BID at home. She denies any ETOH or tobacco use.   Seen by our team at [facility] for consult 04/04/24 for evaluation of anemia and melena. Received multiple units of PRBCs (9) during hospitalization and underwent multiple procedures as noted below.    EGD 04/05/2024: - Normal esophagus.  - Four non- bleeding angioectasias in the stomach. Treated with argon plasma coagulation ( APC) .  - Three recently bleeding angioectasias in the duodenum. Treated with argon plasma coagulation ( APC) .  - No specimens collected. - If continued drop in HBg or overt bleeding will benefit from small bowel capsule endoscopy + / - Colonoscopy   Colonoscopy 04/07/2024: - The examined portion of the ileum was normal.  - Blood in the entire examined colon.  - Two 2 to 4 mm polyps in the transverse colon, removed with a cold snare.  Resected and retrieved. - One 6 mm polyp in the descending colon, removed with a cold snare. Resected and retrieved.  - Diverticulosis in the sigmoid colon and in the ascending colon.  - Non- bleeding internal hemorrhoids. - Path: tubular adenoma, hyperplastic polyps - Proceed with enteroscopy. - Advised repeat in 5 years.    Enteroscopy 04/07/2024: - Normal esophagus.  - Non- bleeding gastric ulcers. These were postablation ulcers. - Non- bleeding duodenal ulcer with a clean ulcer base ( Forrest Class III) . This was postablation ulcer.  - Normal examined jejunum.  - No specimens collected.   CTA bleeding scan (11/19):- No evidence of active GI bleeding.    Givens Capsule 04/10/2024: Study not complete to the cecum and poor prep.  Scattered areas of erythema and linear erosions as well as angiectasias and post ablation ulcers were identified.  There was some hematin present within of the study as well and darkened material through the end which could have possibly been melena.  She subsequently underwent repeat enteroscopy as below.      Past Medical History:  Diagnosis Date   Anxiety and depression    Chronic constipation    Diabetes mellitus, type II (HCC)    Dr. Lenis managing   Frequent headaches chronic   ibuprofen helps some   GERD (gastroesophageal reflux disease)    History of UTI    Recurrent per pt (approx 3 per year)   Hyperlipidemia    Hypertension    Leukocytosis 12/2015   mild lymphocytosis.  Path smear review reassuring.  Repeat 01/2016 stable.   Morbid obesity (HCC)    OSA on CPAP    not using CPAP anymore    Past Surgical History:  Procedure Laterality Date   ABDOMINAL HYSTERECTOMY  1998   Dysfunctional uterine bleeding.  No hx of abnormal paps.   BIOPSY  10/20/2018   Procedure: BIOPSY;  Surgeon: Harvey Margo CROME, MD;  [facility];  Service: Endoscopy;;  stomach   CATARACT EXTRACTION W/PHACO Left 08/24/2022   Procedure: CATARACT EXTRACTION  PHACO AND INTRAOCULAR LENS PLACEMENT (IOC);  Surgeon: Harrie Agent, MD;  Location: [facility];  Service: Ophthalmology;  Laterality: Left;  CDE 8.53   CATARACT EXTRACTION W/PHACO Right 09/07/2022   Procedure: CATARACT EXTRACTION PHACO AND INTRAOCULAR LENS PLACEMENT (IOC);  Surgeon: Harrie Agent, MD;  Location: [facility];  Service: Ophthalmology;  Laterality: Right;  CDE: 10.02   COLONOSCOPY  2012   Dr. Gaylia: 1.5 cm pendunculated descending colon polyp (tubulovillous adenoma) and diminutive hyperplastic polyps   COLONOSCOPY N/A 10/20/2018   Procedure: COLONOSCOPY;  Surgeon: Harvey Margo CROME, MD; 4 tubular adenomas, diverticulosis in the sigmoid colon, tortuous left colon, external and internal hemorrhoids. Due for repeat in 2023.    COLONOSCOPY N/A 04/07/2024   Procedure: COLONOSCOPY;  Surgeon: Eartha Angelia Sieving, MD;  [facility];  Service: Gastroenterology;  Laterality: N/A;   COLONOSCOPY WITH PROPOFOL  N/A 12/24/2022   Procedure: COLONOSCOPY WITH PROPOFOL ;  Surgeon: Cindie Carlin POUR, DO;  [facility];  Service: Endoscopy;  Laterality: N/A;  11:15 am, asa 3   ENTEROSCOPY N/A 04/11/2024   Procedure: ENTEROSCOPY;  Surgeon: Cindie Carlin POUR, DO;  [facility];  Service: Endoscopy;  Laterality: N/A;   ESOPHAGOGASTRODUODENOSCOPY N/A 10/20/2018   Procedure: ESOPHAGOGASTRODUODENOSCOPY (EGD);  Surgeon: Harvey Margo CROME, MD;  gastritis and duodenitis. No H. Pylori.    ESOPHAGOGASTRODUODENOSCOPY N/A 04/07/2024   Procedure: EGD (ESOPHAGOGASTRODUODENOSCOPY);  Surgeon: Eartha Angelia, Sieving, MD;  [facility];  Service: Gastroenterology;  Laterality: N/A;  WITH ENTEROSCOPY   ESOPHAGOGASTRODUODENOSCOPY N/A 04/05/2024   Procedure: EGD (ESOPHAGOGASTRODUODENOSCOPY);  Surgeon: Cinderella Deatrice FALCON, MD;  [facility];  Service: Endoscopy;  Laterality: N/A;   GIVENS CAPSULE STUDY N/A 04/09/2024   Procedure: IMAGING PROCEDURE, GI TRACT, INTRALUMINAL, VIA  CAPSULE;  Surgeon: Cinderella Deatrice FALCON, MD;  [facility];  Service: Endoscopy;  Laterality: N/A;   POLYPECTOMY  10/20/2018   Procedure: POLYPECTOMY;  Surgeon: Harvey Margo CROME, MD;  [facility];  Service: Endoscopy;;   POLYPECTOMY  12/24/2022   Procedure: POLYPECTOMY INTESTINAL;  Surgeon: Cindie Carlin POUR, DO;  [facility];  Service: Endoscopy;;   wart removal  08/04/2019   benign    Prior to Admission medications  Medication Sig Start Date End Date Taking? Authorizing Provider  atorvastatin  (LIPITOR) 20 MG tablet TAKE 1 Tablet BY MOUTH ONCE EVERY DAY Patient taking differently: Take 20 mg by mouth daily. TAKE 1 Tablet BY MOUTH ONCE EVERY DAY 10/05/21   Comer Kirsch, PA-C  cloNIDine  (CATAPRES ) 0.1 MG tablet Take 1 tablet (0.1 mg total) by mouth 2 (two) times daily. Patient taking differently: Take 0.1 mg by mouth daily. 10/05/21   Comer Kirsch, PA-C  Dulaglutide (TRULICITY) 0.75 MG/0.5ML SOPN Inject 0.75 mg into the skin every Monday.    [provider]  glipiZIDE  (GLUCOTROL  XL) 5 MG 24 hr tablet Take 1 tablet (5 mg total) by mouth daily with breakfast. 10/18/22   Nida, Gebreselassie W, MD  linaclotide  (LINZESS ) 72  MCG capsule Take 72 mcg by mouth daily as needed (constipation).    [provider]  losartan -hydrochlorothiazide  (HYZAAR) 100-12.5 MG tablet Take 1 tablet by mouth daily. 11/09/22   [provider]  metFORMIN  (GLUCOPHAGE ) 1000 MG tablet Take 1 tablet (1,000 mg total) by mouth 2 (two) times daily with a meal. 10/05/21   Comer Kirsch, PA-C  pantoprazole  (PROTONIX ) 40 MG tablet Take 1 tablet (40 mg total) by mouth 2 (two) times daily before a meal. 04/12/24   Mcarthur Pick, MD    Current Facility-Administered Medications  Medication Dose Route Frequency Provider Last Rate Last Admin   ipratropium-albuterol  (DUONEB) 0.5-2.5 (3) MG/3ML nebulizer solution 3 mL  3 mL Nebulization Once Beatty, Celeste A, PA-C        Current Outpatient Medications  Medication Sig Dispense Refill   atorvastatin  (LIPITOR) 20 MG tablet TAKE 1 Tablet BY MOUTH ONCE EVERY DAY (Patient taking differently: Take 20 mg by mouth daily. TAKE 1 Tablet BY MOUTH ONCE EVERY DAY) 30 tablet 0   cloNIDine  (CATAPRES ) 0.1 MG tablet Take 1 tablet (0.1 mg total) by mouth 2 (two) times daily. (Patient taking differently: Take 0.1 mg by mouth daily.) 60 tablet 0   Dulaglutide (TRULICITY) 0.75 MG/0.5ML SOPN Inject 0.75 mg into the skin every Monday.     glipiZIDE  (GLUCOTROL  XL) 5 MG 24 hr tablet Take 1 tablet (5 mg total) by mouth daily with breakfast. 90 tablet 1   linaclotide  (LINZESS ) 72 MCG capsule Take 72 mcg by mouth daily as needed (constipation).     losartan -hydrochlorothiazide  (HYZAAR) 100-12.5 MG tablet Take 1 tablet by mouth daily.     metFORMIN  (GLUCOPHAGE ) 1000 MG tablet Take 1 tablet (1,000 mg total) by mouth 2 (two) times daily with a meal. 60 tablet 0   pantoprazole  (PROTONIX ) 40 MG tablet Take 1 tablet (40 mg total) by mouth 2 (two) times daily before a meal. 60 tablet 1    Allergies as of 05/06/2024 - Review Complete 05/06/2024  Allergen Reaction Noted   Januvia  [sitagliptin ] Other (See Comments) 05/21/2018   Amlodipine  Swelling 10/19/2020    Family History  Problem Relation Age of Onset   Cancer Mother        ovarian   COPD Father    Diabetes Father    Hyperlipidemia Father    Heart attack Father    Heart failure Father    Colon cancer Neg Hx     Social History   Socioeconomic History   Marital status: Divorced    Spouse name: Not on file   Number of children: Not on file   Years of education: Not on file   Highest education level: Not on file  Occupational History   Not on file  Tobacco Use   Smoking status: Every Day    Current packs/day: 0.25    Average packs/day: 0.3 packs/day for 20.0 years (5.0 ttl pk-yrs)    Types: Cigarettes   Smokeless tobacco: Never  Vaping Use   Vaping status: Never Used   Substance and Sexual Activity   Alcohol use: No   Drug use: No   Sexual activity: Not on file  Other Topics Concern   Not on file  Social History Narrative   Separated x 20 yrs, one daughter.  She lives with her.  Great niece and nephew live with her as well.   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   Tob: 7 pack year hx, quit 2010.   Alc: none   Exercise: none  Social Drivers of Health   Tobacco Use: High Risk (05/06/2024)   Patient History    Smoking Tobacco Use: Every Day    Smokeless Tobacco Use: Never    Passive Exposure: Not on file  Financial Resource Strain: Low Risk (03/26/2023)   Received from [facility]   Overall Financial Resource Strain (CARDIA)    Difficulty of Paying Living Expenses: Not hard at all  Food Insecurity: No Food Insecurity (04/04/2024)   Epic    Worried About Radiation Protection Practitioner of Food in the Last Year: Never true    Ran Out of Food in the Last Year: Never true  Transportation Needs: No Transportation Needs (04/04/2024)   Epic    Lack of Transportation (Medical): No    Lack of Transportation (Non-Medical): No  Physical Activity: Sufficiently Active (03/26/2023)   Received from [facility]   Exercise Vital Sign    On average, how many days per week do you engage in moderate to strenuous exercise (like a brisk walk)?: 6 days    On average, how many minutes do you engage in exercise at this level?: 30 min  Stress: Stress Concern Present (03/26/2023)   Received from [facility] of Occupational Health - Occupational Stress Questionnaire    Feeling of Stress : To some extent  Social Connections: Moderately Isolated (03/26/2023)   Received from [facility]   Social Connection and Isolation Panel    In a typical week, how many times do you talk on the phone with family, friends, or neighbors?: Once a week    How often do you get together with friends or relatives?: Three times a week    How often do you attend church or  religious services?: More than 4 times per year    Do you belong to any clubs or organizations such as church groups, unions, fraternal or athletic groups, or school groups?: No    How often do you attend meetings of the clubs or organizations you belong to?: Never    Are you married, widowed, divorced, separated, never married, or living with a partner?: Divorced  Intimate Partner Violence: Not At Risk (04/04/2024)   Epic    Fear of Current or Ex-Partner: No    Emotionally Abused: No    Physically Abused: No    Sexually Abused: No  Depression (PHQ2-9): Not on file  Alcohol Screen: Not on file  Housing: Low Risk (04/04/2024)   Epic    Unable to Pay for Housing in the Last Year: No    Number of Times Moved in the Last Year: 0    Homeless in the Last Year: No  Utilities: Not At Risk (04/04/2024)   Epic    Threatened with loss of utilities: No  Health Literacy: Low Risk (03/26/2023)   Received from [facility] Literacy    How often do you need to have someone help you when you read instructions, pamphlets, or other written material from your doctor or pharmacy?: Never     Review of Systems   Gen: Denies any fever, chills, loss of appetite, change in weight or weight loss CV: Denies chest pain, heart palpitations, syncope, edema  Resp: Denies shortness of breath with rest, cough, wheezing, coughing up blood, and pleurisy. GI: denies vomiting, constipation, dysphagia, odyonophagia, early satiety or weight loss. +loose stools +rectal bleeding/melena +nausea +abdominal discomfort.  GU : Denies urinary burning, blood in urine, urinary frequency, and urinary  incontinence. MS: Denies joint pain, limitation of movement, swelling, cramps, and atrophy.  Derm: Denies rash, itching, dry skin, hives. Psych: Denies depression, anxiety, memory loss, hallucinations, and confusion. Heme: Denies bruising or bleeding Neuro:  Denies any headaches, dizziness, paresthesias,  shaking  Physical Exam   Vital Signs in last 24 hours: Temp:  [97.5 F (36.4 C)-98.3 F (36.8 C)] 98.3 F (36.8 C) (12/17 1430) Pulse Rate:  [70-86] 77 (12/17 1430) Resp:  [15-22] 15 (12/17 1430) BP: (91-112)/(37-49) 91/37 (12/17 1430) SpO2:  [93 %-100 %] 93 % (12/17 1430) Weight:  [88.5 kg] 88.5 kg (12/17 1132)   General:   Alert,  Well-developed, well-nourished, pleasant and cooperative in NAD Head:  Normocephalic and atraumatic. Eyes:  Sclera clear, no icterus.   Conjunctiva pink. Ears:  Normal auditory acuity. Mouth:  No deformity or lesions, dentition normal. Lungs:  Clear throughout to auscultation.   No wheezes, crackles, or rhonchi. No acute distress. Heart:  Regular rate and rhythm; no murmurs, clicks, rubs,  or gallops. Abdomen:  Soft, nontender and nondistended. No masses, hepatosplenomegaly or hernias noted. Normal bowel sounds, without guarding, and without rebound.   Msk:  Symmetrical without gross deformities. Normal posture. Extremities:  Without clubbing or edema. Neurologic:  Alert and  oriented x4. Skin:  Intact without significant lesions or rashes.  Psych:  Alert and cooperative. Normal mood and affect.   Labs/Studies   Recent Labs Recent Labs    05/06/24 1147  WBC 14.4*  HGB 7.8*  HCT 25.2*  PLT 300   BMET Recent Labs    05/06/24 1147  NA 140  K 5.2*  CL 104  CO2 20*  GLUCOSE 98  BUN 33*  CREATININE 1.30*  CALCIUM  9.6   LFT Recent Labs    05/06/24 1147  PROT 7.2  ALBUMIN 3.9  AST 24  ALT 13  ALKPHOS 81  BILITOT 0.3    Assessment   Adrieana F Lippold is a 63 y.o. year old female with history of type 2 diabetes, hyperlipidemia, hypertension, angiectasia's of the GI tract, GERD who presented to the ED for blood in stools x2 days. Taking protonix  compliantly, GI consulted for further evaluation  Rectal bleeding/anemia: -onset of BRBPR with transition to darker red/black stools, began Monday -denies NSAIDs, ETOH -endorses  nausea -Hgb 7.7, 8 --3 weeks ago -Stool heme positive -Recent EGD and Colonoscopy as above with angiectasias in stomach/duodenum and ulcers in stomach and duodenum -reports compliance with at home PPI therapy  At this time recommend proceeding with transfusion of PRBCs to optimize her hemodynamically, continue PPI BID and plan for EGD with possible push enteroscopy tomorrow for further evaluation given history of gastric/duodenal ulcers and AVMs. Ultimately need benefit from octreotide if found to have recurrent AVMs. Indications, risks and benefits of procedure discussed in detail with patient. Patient verbalized understanding and is in agreement to proceed with EGD and possible push enteroscopy tomorrow.    Plan / Recommendations   -PPI BID -trend h&h, agree with transfusion of PRBCs today  -Monitor for overt GI bleeding -EGD with possible push enteroscopy tomorrow as long as hemodynamically stable  -clear liquids today, NPO midnight    05/06/2024, 2:32 PM  Dawnell Bryant L. Veta Dambrosia, MSN, APRN, AGNP-C Adult-Gerontology Nurse Practitioner [facility]astroenter[facility] at [facility]

## 2024-05-06 NOTE — H&P (Signed)
 History and Physical    Patient: Casey Johnston DOB: [birth year] DOA: 05/06/2024 DOS: the patient was seen and examined on 05/06/2024 PCP: Teresa Jenkins Jansky, FNP  Patient coming from: Home  Chief Complaint:  Chief Complaint  Patient presents with   Hematochezia    HPI: Casey Johnston is a 63 y.o. 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  Patient was just discharged 11/15 with similar presentation.  Patient received endoscopy x 2, colonoscopy, capsule endoscopy.  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  Received a total of 9 units PRBCs during the course of that hospital stay.  Patient now returning with dark melanotic stools and progressive weakness.  Patient states that her rebleeding was noticed on Sunday however it seemed mild at that time.  However today noted that she became more short of breath and continued to have a lot of melanotic stool and decided to return to the emergency department.  Patient has been avoiding NSAIDs, does not take blood thinners or antiplatelets, and has been compliant with Protonix .  She admits to nausea and some epigastric pain along with her melanotic stools and weakness but denies fever, chest pain, purulent sputum, vomiting.  Upon evaluation emergency department, systolic blood pressures in the 90s, hemoglobin 7.8, fecal occult blood positive with rectal exam noting obvious melena.   Review of Systems: As mentioned in the history of present illness. All other systems reviewed and are negative. Past Medical History:  Diagnosis Date   Anxiety and depression    Chronic constipation    Diabetes mellitus, type II (HCC)    Dr. Lenis managing   Frequent headaches chronic   ibuprofen helps some   GERD (gastroesophageal reflux disease)    History of UTI    Recurrent per pt (approx  3 per year)   Hyperlipidemia    Hypertension    Leukocytosis 12/2015   mild lymphocytosis.  Path smear review reassuring.  Repeat 01/2016 stable.   Morbid obesity (HCC)    OSA on CPAP    not using CPAP anymore   Past Surgical History:  Procedure Laterality Date   ABDOMINAL HYSTERECTOMY  1998   Dysfunctional uterine bleeding.  No hx of abnormal paps.   BIOPSY  10/20/2018   Procedure: BIOPSY;  Surgeon: Harvey Margo CROME, MD;  [facility]: [facility];  Service: Endoscopy;;  stomach   CATARACT EXTRACTION W/PHACO Left 08/24/2022   Procedure: CATARACT EXTRACTION PHACO AND INTRAOCULAR LENS PLACEMENT (IOC);  Surgeon: Harrie Agent, MD;  Location: [facility];  Service: Ophthalmology;  Laterality: Left;  CDE 8.53   CATARACT EXTRACTION W/PHACO Right 09/07/2022   Procedure: CATARACT EXTRACTION PHACO AND INTRAOCULAR LENS PLACEMENT (IOC);  Surgeon: Harrie Agent, MD;  Location: [facility];  Service: Ophthalmology;  Laterality: Right;  CDE: 10.02   COLONOSCOPY  2012   Dr. Gaylia: 1.5 cm pendunculated descending colon polyp (tubulovillous adenoma) and diminutive hyperplastic polyps   COLONOSCOPY N/A 10/20/2018   Procedure: COLONOSCOPY;  Surgeon: Harvey Margo CROME, MD; 4 tubular adenomas, diverticulosis in the sigmoid colon, tortuous left colon, external and internal hemorrhoids. Due for repeat in 2023.    COLONOSCOPY N/A 04/07/2024   Procedure: COLONOSCOPY;  Surgeon: Eartha Angelia Sieving, MD;  Location: [facility];  Service: Gastroenterology;  Laterality: N/A;   COLONOSCOPY WITH PROPOFOL  N/A 12/24/2022   Procedure: COLONOSCOPY WITH PROPOFOL ;  Surgeon: Cindie,  Carlin POUR, DO;  Location: [facility];  Service: Endoscopy;  Laterality: N/A;  11:15 am, asa 3   ENTEROSCOPY N/A 04/11/2024   Procedure: ENTEROSCOPY;  Surgeon: Cindie Carlin POUR, DO;  Location: [facility];  Service: Endoscopy;  Laterality: N/A;   ESOPHAGOGASTRODUODENOSCOPY N/A 10/20/2018   Procedure: ESOPHAGOGASTRODUODENOSCOPY (EGD);  Surgeon: Harvey Margo CROME, MD;  gastritis and duodenitis. No H. Pylori.    ESOPHAGOGASTRODUODENOSCOPY N/A 04/07/2024   Procedure: EGD (ESOPHAGOGASTRODUODENOSCOPY);  Surgeon: Eartha Flavors, Toribio, MD;  Location: [facility];  Service: Gastroenterology;  Laterality: N/A;  WITH ENTEROSCOPY   ESOPHAGOGASTRODUODENOSCOPY N/A 04/05/2024   Procedure: EGD (ESOPHAGOGASTRODUODENOSCOPY);  Surgeon: Cinderella Deatrice FALCON, MD;  Location: [facility];  Service: Endoscopy;  Laterality: N/A;   GIVENS CAPSULE STUDY N/A 04/09/2024   Procedure: IMAGING PROCEDURE, GI TRACT, INTRALUMINAL, VIA CAPSULE;  Surgeon: Cinderella Deatrice FALCON, MD;  Location: [facility];  Service: Endoscopy;  Laterality: N/A;   POLYPECTOMY  10/20/2018   Procedure: POLYPECTOMY;  Surgeon: Harvey Margo CROME, MD;  [facility]: [facility];  Service: Endoscopy;;   POLYPECTOMY  12/24/2022   Procedure: POLYPECTOMY INTESTINAL;  Surgeon: Cindie Carlin POUR, DO;  Location: [facility];  Service: Endoscopy;;   wart removal  08/04/2019   benign   Social History:  reports that she has been smoking cigarettes. She has a 5 pack-year smoking history. She has never used smokeless tobacco. She reports that she does not drink alcohol and does not use drugs.  Allergies[1]  Family History  Problem Relation Age of Onset   Cancer Mother        ovarian   COPD Father    Diabetes Father    Hyperlipidemia Father    Heart attack Father    Heart failure Father    Colon cancer Neg Hx     Prior to Admission medications  Medication Sig Start Date End Date Taking? Authorizing Provider  atorvastatin  (LIPITOR) 20 MG tablet TAKE 1 Tablet BY MOUTH ONCE EVERY DAY Patient taking differently: Take 20 mg by mouth daily. TAKE 1 Tablet BY MOUTH ONCE EVERY DAY 10/05/21   Comer Kirsch, PA-C  cloNIDine  (CATAPRES ) 0.1 MG tablet Take 1 tablet (0.1 mg total) by mouth 2 (two) times daily. Patient taking differently: Take 0.1 mg by mouth daily. 10/05/21   Comer Kirsch, PA-C  Dulaglutide  (TRULICITY) 0.75 MG/0.5ML SOPN Inject 0.75 mg into the skin every Monday.    [provider]  glipiZIDE  (GLUCOTROL  XL) 5 MG 24 hr tablet Take 1 tablet (5 mg total) by mouth daily with breakfast. 10/18/22   Nida, Gebreselassie W, MD  linaclotide  (LINZESS ) 72 MCG capsule Take 72 mcg by mouth daily as needed (constipation).    [provider]  losartan -hydrochlorothiazide  (HYZAAR) 100-12.5 MG tablet Take 1 tablet by mouth daily. 11/09/22   [provider]  metFORMIN  (GLUCOPHAGE ) 1000 MG tablet Take 1 tablet (1,000 mg total) by mouth 2 (two) times daily with a meal. 10/05/21   Comer Kirsch, PA-C  pantoprazole  (PROTONIX ) 40 MG tablet Take 1 tablet (40 mg total) by mouth 2 (two) times daily before a meal. 04/12/24   Mcarthur Pick, MD    Physical Exam:  Vitals:   05/06/24 1457 05/06/24 1500 05/06/24 1515 05/06/24 1540  BP:  (!) 99/48  (!) 110/47  Pulse:  76 78 75  Resp:  (!) 23 17 18   Temp:      TempSrc:      SpO2: 91% 100% 96% 94%  Weight:  Height:        GENERAL:  Alert, pleasant, no acute distress  HEENT:  EOMI CARDIOVASCULAR:  RRR, no murmurs appreciated RESPIRATORY:  Clear to auscultation, no wheezing, rales, or rhonchi GASTROINTESTINAL:  Soft, minimally tender, nondistended EXTREMITIES:  No LE edema bilaterally NEURO:  No new focal deficits appreciated SKIN:  No rashes noted PSYCH:  Appropriate mood and affect    Data Reviewed:  Results are pending, will review when available.  DG Abd 1 View Result Date: 04/24/2024 CLINICAL DATA:  Retained capsule.  Chronic constipation. EXAM: ABDOMEN - 1 VIEW COMPARISON:  Radiograph 04/11/2024 FINDINGS: The previous retained endoscopy capsule is no longer seen. Moderate volume of colonic stool. No bowel dilatation or evidence of obstruction. No visible radiopaque calculi. Vascular calcifications. No acute osseous findings. IMPRESSION: 1. Previous retained endoscopy capsule is no longer seen. 2. Moderate colonic  stool burden. Electronically Signed   By: Andrea Gasman M.D.   On: 04/24/2024 20:36   DG Abd 1 View Result Date: 04/11/2024 CLINICAL DATA:  [id number] Foreign body alimentary tract [id number] EXAM: ABDOMEN - 1 VIEW COMPARISON:  April 08, 2024 FINDINGS: Air-filled nondilated loops of bowel. Air is visualized in the rectum. Endoscopy capsule projects over the LEFT upper quadrant. Degenerative changes of the lumbar spine. Atherosclerotic calcifications. IMPRESSION: Endoscopy capsule projects over the LEFT upper quadrant. Electronically Signed   By: Corean Salter M.D.   On: 04/11/2024 12:09   CT ANGIO GI BLEED Result Date: 04/08/2024 EXAM: CTA ABDOMEN AND PELVIS WITH CONTRAST 04/08/2024 11:42:52 AM TECHNIQUE: CTA images of the abdomen and pelvis with intravenous contrast. 100 mL (iohexol  (OMNIPAQUE ) 350 MG/ML injection 100 mL IOHEXOL  350 MG/ML SOLN). Three-dimensional MIP/volume rendered formations were performed. Automated exposure control, iterative reconstruction, and/or weight based adjustment of the mA/kV was utilized to reduce the radiation dose to as low as reasonably achievable. COMPARISON: None available. CLINICAL HISTORY: hypotension, symptomatic anemia with persistent decline in hemoglobin FINDINGS: VASCULATURE: GI BLEED: No evidence of active gastrointestinal bleeding within the stomach, small bowel, or colon. AORTA: No acute finding. No abdominal aortic aneurysm. No dissection. CELIAC TRUNK: No acute finding. No occlusion or significant stenosis. SUPERIOR MESENTERIC ARTERY: No acute finding. No occlusion or significant stenosis. INFERIOR MESENTERIC ARTERY: No acute finding. No occlusion or significant stenosis. RENAL ARTERIES: No acute finding. No occlusion or significant stenosis. ILIAC ARTERIES: No acute finding. No occlusion or significant stenosis. ABDOMEN/PELVIS: LOWER CHEST: Bibasilar interstitial thickening and mild peripheral consolidation potential pulmonary infection or aspiration  pneumonitis. LIVER: The liver is unremarkable. GALLBLADDER AND BILE DUCTS: Gallbladder is unremarkable. No biliary ductal dilatation. SPLEEN: The spleen is unremarkable. PANCREAS: The pancreas is unremarkable. ADRENAL GLANDS: Bilateral adrenal glands demonstrate no acute abnormality. KIDNEYS, URETERS AND BLADDER: Adjacent nonenhancing simple fluid attenuation cyst of the right kidney measured 3.6 x 2.4 cm. No follow-up recommended for benign renal lesion . No stones in the kidneys or ureters. No hydronephrosis. No perinephric or periureteral stranding. Urinary bladder is unremarkable. GI AND BOWEL: Stomach and duodenal sweep demonstrate no acute abnormality. Several diverticula of the left colon without acute inflammation. There is no bowel obstruction. No abnormal bowel wall thickening or distension. REPRODUCTIVE: Post hysterectomy. PERITONEUM AND RETROPERITONEUM: No ascites or free air. LYMPH NODES: No lymphadenopathy. BONES AND SOFT TISSUES: No acute abnormality of the bones. No acute soft tissue abnormality. IMPRESSION: 1. No evidence of active gastrointestinal bleeding. 2. Bibasilar interstitial thickening with mild peripheral consolidation, concerning for pulmonary infection versus aspiration pneumonitis. Electronically signed by: Norleen Boxer MD 04/08/2024 12:13  PM EST RP Workstation: [id number]    Assessment and Plan:  Concern for upper GI bleed - Known history of angioectasia with observed frank melena.  GI consulted and following closely.  Likely to pursue repeat endoscopy either later today or in a.m.  Currently NPO.  Acute on chronic blood loss anemia - Will order iron studies to evaluate iron deficiency.  Ordered 1 unit PRBCs from the ED.  Will trend hemoglobin Q 4-6 hours.  Transfuse if hemoglobin less than 7.  Acute kidney injury - Creatinine 1.30, previous baseline 0.7.  Likely prerenal etiology given GI bleed and dehydration/GI loss.  Initiating NS at 75.  Receiving 1 unit PRBCs.  Will  monitor urine output recheck BMP in AM.  Mild hyperkalemia - Potassium 5.2.  Likely in setting of volume depletion.  IV fluids on board.  Will recheck BMP in AM.  Leukocytosis - No fevers or concern for infectious etiology, likely stress-induced.  Holding off on antibiotics at this time.  Will recheck CBC in AM.  Diabetes mellitus - Will order insulin  sliding scale.   Advance Care Planning:   Code Status: Full Code   Consults: Gastroenterology  Family Communication: None at bedside  Severity of Illness: The appropriate patient status for this patient is INPATIENT. Inpatient status is judged to be reasonable and necessary in order to provide the required intensity of service to ensure the patient's safety. The patient's presenting symptoms, physical exam findings, and initial radiographic and laboratory data in the context of their chronic comorbidities is felt to place them at high risk for further clinical deterioration. Furthermore, it is not anticipated that the patient will be medically stable for discharge from the hospital within 2 midnights of admission.   * I certify that at the point of admission it is my clinical judgment that the patient will require inpatient hospital care spanning beyond 2 midnights from the point of admission due to high intensity of service, high risk for further deterioration and high frequency of surveillance required.*  Author: Carliss LELON Canales, DO 05/06/2024 4:08 PM  For on call review [email or web address].     [1]  Allergies Allergen Reactions   Januvia  [Sitagliptin ] Other (See Comments)    Caused pancreatitis   Amlodipine  Swelling

## 2024-05-06 NOTE — ED Notes (Signed)
 Phlebotomy at bedside.

## 2024-05-06 NOTE — ED Triage Notes (Signed)
 Pt arrived via POV from home c/o seeing blood in her stool since yesterday. Pt denies pain but does endorse fatigue and weakness.

## 2024-05-06 NOTE — Telephone Encounter (Signed)
 Pt called and states that she has had blood in her stool for a couple of days. Advised if it got worse to go to the ED, because provider was not in the building.

## 2024-05-06 NOTE — ED Provider Notes (Signed)
 [place] EMERGENCY DEPARTMENT AT [facility] Provider Note   CSN: [id number] Arrival date & time: 05/06/24  1127     Patient presents with: Hematochezia   Casey Johnston is a 63 y.o. female.  He has history of type 2 diabetes, hyperlipidemia, hypertension, angiectasia's of the GI tract, GERD.  Presents ER today for evaluation of blood in your stool x 2 days.  States this happened at least 6 times since this started.  Nurses bright red blood in the toilet mixed with stool, now the blood is darker red in color next with stool.  She is not sure how much, but states noted during the toilet bowl bright red.  She is not on blood thinners.  Denies taking NSAIDs, denies alcohol use.  She states she was admitted to hospital in November of this year, and had to have endoscopy, colonoscopy and enteroscopy, she had multiple polyps removed.  She had 4 AVMs in jejunum and duodenum on 11/22 that were treated with APC, and several nonbleeding stomach ulcers.  Patient states she has been on Protonix  and has been compliant with this.   HPI     Prior to Admission medications  Medication Sig Start Date End Date Taking? Authorizing Provider  atorvastatin  (LIPITOR) 20 MG tablet TAKE 1 Tablet BY MOUTH ONCE EVERY DAY Patient taking differently: Take 20 mg by mouth daily. TAKE 1 Tablet BY MOUTH ONCE EVERY DAY 10/05/21   Comer Kirsch, PA-C  cloNIDine  (CATAPRES ) 0.1 MG tablet Take 1 tablet (0.1 mg total) by mouth 2 (two) times daily. Patient taking differently: Take 0.1 mg by mouth daily. 10/05/21   Comer Kirsch, PA-C  Dulaglutide (TRULICITY) 0.75 MG/0.5ML SOPN Inject 0.75 mg into the skin every Monday.    [provider]  glipiZIDE  (GLUCOTROL  XL) 5 MG 24 hr tablet Take 1 tablet (5 mg total) by mouth daily with breakfast. 10/18/22   Nida, Gebreselassie W, MD  linaclotide  (LINZESS ) 72 MCG capsule Take 72 mcg by mouth daily as needed (constipation).    [provider]   losartan -hydrochlorothiazide  (HYZAAR) 100-12.5 MG tablet Take 1 tablet by mouth daily. 11/09/22   [provider]  metFORMIN  (GLUCOPHAGE ) 1000 MG tablet Take 1 tablet (1,000 mg total) by mouth 2 (two) times daily with a meal. 10/05/21   Comer Kirsch, PA-C  pantoprazole  (PROTONIX ) 40 MG tablet Take 1 tablet (40 mg total) by mouth 2 (two) times daily before a meal. 04/12/24   Mcarthur Pick, MD    Allergies: Januvia  [sitagliptin ] and Amlodipine     Review of Systems  Updated Vital Signs BP (!) 111/49 (BP Location: Right Arm)   Pulse 86   Temp 98 F (36.7 C) (Oral)   Resp 18   Ht 5' 5 (1.651 m)   Wt 88.5 kg   SpO2 100%   BMI 32.45 kg/m   Physical Exam Vitals and nursing note reviewed.  Constitutional:      General: She is not in acute distress.    Appearance: She is well-developed. She is not toxic-appearing.  HENT:     Head: Normocephalic and atraumatic.     Mouth/Throat:     Mouth: Mucous membranes are moist.  Eyes:     Pupils: Pupils are equal, round, and reactive to light.     Comments: Pale palpebral conjunctivae  Cardiovascular:     Rate and Rhythm: Normal rate and regular rhythm.     Heart sounds: No murmur heard. Pulmonary:     Effort: Pulmonary effort is  normal. No respiratory distress.     Breath sounds: Normal breath sounds.  Abdominal:     Palpations: Abdomen is soft.     Tenderness: There is no abdominal tenderness. There is no guarding or rebound.  Genitourinary:    Rectum: Guaiac result positive.     Comments: Gross dark red blood noted on rectal exam, no obvious hemorrhoids externally Musculoskeletal:        General: No swelling.     Cervical back: Neck supple.     Right lower leg: No edema.     Left lower leg: No edema.  Skin:    General: Skin is warm and dry.     Capillary Refill: Capillary refill takes less than 2 seconds.  Neurological:     General: No focal deficit present.     Mental Status: She is alert and oriented to person,  place, and time.  Psychiatric:        Mood and Affect: Mood normal.     (all labs ordered are listed, but only abnormal results are displayed) Labs Reviewed  CBC WITH DIFFERENTIAL/PLATELET  COMPREHENSIVE METABOLIC PANEL WITH GFR  URINALYSIS, ROUTINE W REFLEX MICROSCOPIC  POC OCCULT BLOOD, ED  I-STAT CHEM 8, ED  TYPE AND SCREEN    EKG: None  Radiology: No results found.   .Critical Care  Performed by: Suellen Sherran LABOR, PA-C Authorized by: Suellen Sherran LABOR, PA-C   Critical care provider statement:    Critical care time (minutes):  40   Critical care time was exclusive of:  Separately billable procedures and treating other patients and teaching time   Critical care was necessary to treat or prevent imminent or life-threatening deterioration of the following conditions: GI bleeding with hypotension requiring blood transfusion.   Critical care was time spent personally by me on the following activities:  Development of treatment plan with patient or surrogate, discussions with consultants, evaluation of patient's response to treatment, examination of patient, ordering and review of laboratory studies, ordering and review of radiographic studies, ordering and performing treatments and interventions, pulse oximetry, re-evaluation of patient's condition, review of old charts and obtaining history from patient or surrogate   Care discussed with comment:  Dr. Cindie the gastroenterologist, and tried hospitalist    Medications Ordered in the ED  pantoprazole  (PROTONIX ) injection 40 mg (has no administration in time range)                                    Medical Decision Making This patient presents to the ED for concern of dizziness, weakness and blood in the stool, this involves an extensive number of treatment options, and is a complaint that carries with it a high risk of complications and morbidity.  The differential diagnosis includes peptic ulcer disease, intestinal AVM,  anemia, other   Co morbidities that complicate the patient evaluation :   GI bleeding   Additional history obtained:  Additional history obtained from EMR External records from outside source obtained and reviewed including recent admission for GI bleeding including GI note from endoscopy, enteroscopy and colonoscopy, prior labs   Lab Tests:  I Ordered, and personally interpreted labs.  The pertinent results include: Hemoglobin 7.8, white blood cell count 14.4, CMP with potassium 5.2, BUN 33, creatinine 1.3, anion gap 16 FOBT positive UA pending    Cardiac Monitoring: / EKG:  The patient was maintained on a cardiac monitor.  I  personally viewed and interpreted the cardiac monitored which showed an underlying rhythm of: sinus rhythm, t wave abnormality similar to prior in 2024   Consultations Obtained:  I requested consultation with the Gastroenterologist, Dr. Cindie,  and discussed lab and imaging findings as well as pertinent plan - they recommend: Patient can a clear liquid diet and they will admit patient. Discussed with  [facility] for admission.   Problem List / ED Course / Critical interventions / Medication management  GI bleed-patient has been dizzy, feeling weak and having blood in her stool x 2 days, no gross blood on rectal exam.  Patient was slightly hypotensive, given small fluid bolus and transfuse 1 unit of blood.  Hemoglobin is only 7.8, it was 8 3 weeks ago, clinically I am concerned about acute blood loss causing her hypotension.  Patient's blood pressure has improved on reevaluation.  She is slightly pale but otherwise nontoxic in appearance she has no abdominal pain, no fever, she does have mild leukocytosis which could be reactive from blood loss.  There are no signs of infection.  Discussed with Dr. Cindie from GI who states patient can be on clear liquid diet and they will see patient, requested hospitalist to admit.  Patient is agreeable with plan. Labs  did show potassium of 5.2, no peaked T waves, avoided Lokelma due to the GI bleeding, so patient was given fluids and albuterol  for this. I ordered medication including IV fluids and packed red blood cells for hypotension, given Protonix  for GI bleeding Reevaluation of the patient after these medicines showed that the patient improved I have reviewed the patients home medicines and have made adjustments as needed   Social Determinants of Health:  Patient lives at home      Amount and/or Complexity of Data Reviewed Labs: ordered.  Risk Prescription drug management. Decision regarding hospitalization.        Final diagnoses:  None    ED Discharge Orders     None          Suellen Sherran LABOR, PA-C 05/06/24 1601

## 2024-05-06 NOTE — Hospital Course (Signed)
 63 y.o. 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   Assessment and Plan:   Concern for upper GI bleed - Known history of angioectasia with observed frank melena.  GI consulted and following closely.  Likely to pursue repeat endoscopy either later today or in a.m.  Currently NPO.   Acute on chronic blood loss anemia - Will order iron studies to evaluate iron deficiency.  Ordered 1 unit PRBCs from the ED.  Will trend hemoglobin Q 4-6 hours.  Transfuse if hemoglobin less than 7.   Acute kidney injury - Creatinine 1.30, previous baseline 0.7.  Likely prerenal etiology given GI bleed and dehydration/GI loss.  Initiating NS at 75.  Receiving 1 unit PRBCs.  Will monitor urine output recheck BMP in AM.   Mild hyperkalemia - Potassium 5.2.  Likely in setting of volume depletion.  IV fluids on board.  Will recheck BMP in AM.   Leukocytosis - No fevers or concern for infectious etiology, likely stress-induced.  Holding off on antibiotics at this time.  Will recheck CBC in AM.   Diabetes mellitus - Will order insulin  sliding scale.

## 2024-05-07 ENCOUNTER — Inpatient Hospital Stay (HOSPITAL_COMMUNITY)

## 2024-05-07 ENCOUNTER — Encounter (HOSPITAL_COMMUNITY): Admission: EM | Disposition: A | Payer: Self-pay | Source: Home / Self Care | Attending: Pulmonary Disease

## 2024-05-07 ENCOUNTER — Encounter (HOSPITAL_COMMUNITY): Payer: Self-pay | Admitting: Anesthesiology

## 2024-05-07 ENCOUNTER — Encounter (HOSPITAL_COMMUNITY): Admission: EM | Payer: Self-pay | Source: Home / Self Care | Attending: Pulmonary Disease

## 2024-05-07 DIAGNOSIS — N179 Acute kidney failure, unspecified: Secondary | ICD-10-CM

## 2024-05-07 DIAGNOSIS — E875 Hyperkalemia: Secondary | ICD-10-CM | POA: Diagnosis not present

## 2024-05-07 DIAGNOSIS — D62 Acute posthemorrhagic anemia: Secondary | ICD-10-CM | POA: Diagnosis not present

## 2024-05-07 DIAGNOSIS — K31819 Angiodysplasia of stomach and duodenum without bleeding: Secondary | ICD-10-CM

## 2024-05-07 DIAGNOSIS — K625 Hemorrhage of anus and rectum: Secondary | ICD-10-CM

## 2024-05-07 DIAGNOSIS — K922 Gastrointestinal hemorrhage, unspecified: Secondary | ICD-10-CM

## 2024-05-07 DIAGNOSIS — E861 Hypovolemia: Secondary | ICD-10-CM

## 2024-05-07 LAB — GLUCOSE, CAPILLARY
Glucose-Capillary: 207 mg/dL — ABNORMAL HIGH (ref 70–99)
Glucose-Capillary: 192 mg/dL — ABNORMAL HIGH (ref 70–99)
Glucose-Capillary: 174 mg/dL — ABNORMAL HIGH (ref 70–99)
Glucose-Capillary: 174 mg/dL — ABNORMAL HIGH (ref 70–99)

## 2024-05-07 LAB — BASIC METABOLIC PANEL WITH GFR
Anion gap: 5 (ref 5–15)
Anion gap: 10 (ref 5–15)
BUN: 43 mg/dL — ABNORMAL HIGH (ref 8–23)
BUN: 38 mg/dL — ABNORMAL HIGH (ref 8–23)
CO2: 26 mmol/L (ref 22–32)
CO2: 20 mmol/L — ABNORMAL LOW (ref 22–32)
Calcium: 7.9 mg/dL — ABNORMAL LOW (ref 8.9–10.3)
Calcium: 7.6 mg/dL — ABNORMAL LOW (ref 8.9–10.3)
Chloride: 107 mmol/L (ref 98–111)
Chloride: 110 mmol/L (ref 98–111)
Creatinine, Ser: 1.13 mg/dL — ABNORMAL HIGH (ref 0.44–1.00)
Creatinine, Ser: 0.97 mg/dL (ref 0.44–1.00)
GFR, Estimated: 54 mL/min — ABNORMAL LOW (ref >60)
GFR, Estimated: >60 mL/min (ref >60)
Glucose, Bld: 153 mg/dL — ABNORMAL HIGH (ref 70–99)
Glucose, Bld: 167 mg/dL — ABNORMAL HIGH (ref 70–99)
Potassium: 6.1 mmol/L — ABNORMAL HIGH (ref 3.5–5.1)
Potassium: 5.2 mmol/L — ABNORMAL HIGH (ref 3.5–5.1)
Sodium: 138 mmol/L (ref 135–145)
Sodium: 140 mmol/L (ref 135–145)

## 2024-05-07 LAB — PREPARE RBC (CROSSMATCH)

## 2024-05-07 LAB — CBC
HCT: 18.4 % — ABNORMAL LOW (ref 36.0–46.0)
Hemoglobin: 5.7 g/dL — CL (ref 12.0–15.0)
MCH: 26.1 pg (ref 26.0–34.0)
MCHC: 31 g/dL (ref 30.0–36.0)
MCV: 84.4 fL (ref 80.0–100.0)
Platelets: 211 K/uL (ref 150–400)
RBC: 2.18 MIL/uL — ABNORMAL LOW (ref 3.87–5.11)
RDW: 20.2 % — ABNORMAL HIGH (ref 11.5–15.5)
WBC: 12.4 K/uL — ABNORMAL HIGH (ref 4.0–10.5)
nRBC: 0 % (ref 0.0–0.2)

## 2024-05-07 LAB — MRSA NEXT GEN BY PCR, NASAL: MRSA by PCR Next Gen: NOT DETECTED

## 2024-05-07 LAB — HEMOGLOBIN AND HEMATOCRIT, BLOOD
HCT: 22 % — ABNORMAL LOW (ref 36.0–46.0)
HCT: 18.6 % — ABNORMAL LOW (ref 36.0–46.0)
Hemoglobin: 7.3 g/dL — ABNORMAL LOW (ref 12.0–15.0)
Hemoglobin: 6.2 g/dL — CL (ref 12.0–15.0)

## 2024-05-07 LAB — FOLATE: Folate: 13.5 ng/mL (ref >5.9)

## 2024-05-07 LAB — HIV ANTIBODY (ROUTINE TESTING W REFLEX): HIV Screen 4th Generation wRfx: NONREACTIVE

## 2024-05-07 SURGERY — EGD (ESOPHAGOGASTRODUODENOSCOPY)
Anesthesia: Choice

## 2024-05-07 MED ORDER — SODIUM CHLORIDE 0.9 % IV BOLUS
1000.0000 mL | Freq: Once | INTRAVENOUS | Status: AC
  Administered 2024-05-07: 16:00:00 1000 mL via INTRAVENOUS

## 2024-05-07 MED ORDER — SODIUM CHLORIDE 0.9% IV SOLUTION: Freq: Once | INTRAVENOUS | Status: AC

## 2024-05-07 MED ORDER — IOHEXOL 350 MG/ML SOLN
100.0000 mL | Freq: Once | INTRAVENOUS | Status: AC | PRN
  Administered 2024-05-07: 10:00:00 100 mL via INTRAVENOUS

## 2024-05-07 MED ORDER — SODIUM CHLORIDE 0.9 % IV BOLUS
1000.0000 mL | Freq: Once | INTRAVENOUS | Status: AC
  Administered 2024-05-07: 10:00:00 1000 mL via INTRAVENOUS

## 2024-05-07 MED ORDER — SODIUM CHLORIDE 0.9 % IV BOLUS
1000.0000 mL | Freq: Once | INTRAVENOUS | Status: AC
  Administered 2024-05-07: 09:00:00 1000 mL via INTRAVENOUS

## 2024-05-07 MED ADMIN — Calcium Gluconate-NaCl IV Soln 1 GM/50ML-0.675% (20 MG/ML): 1000 mg | INTRAVENOUS | @ 10:00:00 | NDC 44567062001

## 2024-05-07 MED ADMIN — Insulin Aspart Inj 100 Unit/ML: 10 [IU] | INTRAVENOUS | @ 11:00:00 | NDC 73070010011

## 2024-05-07 MED ADMIN — Sodium Chloride IV Soln 0.9%: 500 mL | INTRAVENOUS | @ 11:00:00 | NDC 00264580200

## 2024-05-07 MED ADMIN — Dextrose Inj 50%: 50 mL | INTRAVENOUS | @ 08:00:00 | NDC 76329330201

## 2024-05-07 MED ADMIN — Furosemide Inj 10 MG/ML: 40 mg | INTRAVENOUS | @ 10:00:00 | NDC 71288020304

## 2024-05-07 MED FILL — Dextrose Inj 50%: 1.0000 | INTRAVENOUS | Qty: 50 | Status: AC

## 2024-05-07 MED FILL — Furosemide Inj 10 MG/ML: 40.0000 mg | INTRAMUSCULAR | Qty: 4 | Status: AC

## 2024-05-07 MED FILL — Calcium Gluconate-NaCl IV Soln 1 GM/50ML-0.675% (20 MG/ML): 1.0000 g | INTRAVENOUS | Qty: 50 | Status: AC

## 2024-05-07 MED FILL — Insulin Aspart Inj Soln 100 Unit/ML: 10.0000 [IU] | INTRAMUSCULAR | Qty: 1 | Status: AC

## 2024-05-07 NOTE — Progress Notes (Addendum)
°   05/07/24 0600  Provider Notification  Provider Name/Title Erminio Cone NP  Date Provider Notified 05/07/24  Time Provider Notified [phone]  Method of Notification  (amion first attempt. Provider responsed within 3 min)  Notification Reason Critical Result (Hgb 5.7)  Provider response See new orders (2 units PRBC)  Date of Provider Response 05/07/24  Time of Provider Response [phone]   Orders for 2 units placed at 0609 awaiting cross match and unit availability.

## 2024-05-07 NOTE — Anesthesia Preprocedure Evaluation (Signed)
 Anesthesia Evaluation  Patient identified by MRN, date of birth, ID band Patient awake    Reviewed: Allergy & Precautions, H&P , NPO status , Patient's Chart, lab work & pertinent test results, reviewed documented beta blocker date and time   Airway Mallampati: II  TM Distance: >3 FB Neck ROM: full    Dental no notable dental hx. (+) Dental Advisory Given, Teeth Intact   Pulmonary sleep apnea , Current Smoker and Patient abstained from smoking.   Pulmonary exam normal breath sounds clear to auscultation       Cardiovascular Exercise Tolerance: Good hypertension, Normal cardiovascular exam Rhythm:regular Rate:Normal     Neuro/Psych  Headaches PSYCHIATRIC DISORDERS Anxiety Depression       GI/Hepatic Neg liver ROS,GERD  ,,  Endo/Other  diabetes, Poorly Controlled, Type 2    Renal/GU negative Renal ROS  negative genitourinary   Musculoskeletal   Abdominal   Peds  Hematology  (+) Blood dyscrasia, anemia Hgb 8.5   Anesthesia Other Findings   Reproductive/Obstetrics negative OB ROS                              Anesthesia Physical Anesthesia Plan  ASA: 3 and emergent  Anesthesia Plan: General   Post-op Pain Management: Minimal or no pain anticipated   Induction:   PONV Risk Score and Plan: Propofol  infusion  Airway Management Planned: Natural Airway and Nasal Cannula  Additional Equipment: None  Intra-op Plan:   Post-operative Plan:   Informed Consent: I have reviewed the patients History and Physical, chart, labs and discussed the procedure including the risks, benefits and alternatives for the proposed anesthesia with the patient or authorized representative who has indicated his/her understanding and acceptance.     Dental Advisory Given  Plan Discussed with: CRNA  Anesthesia Plan Comments:          Anesthesia Quick Evaluation

## 2024-05-07 NOTE — Progress Notes (Signed)
 Progress Note   Patient: Casey Johnston FMW:[id number] DOB: 12/08/1960 DOA: 05/06/2024  DOS: the patient was seen and examined on 05/07/2024   Brief hospital course:  63 y.o. 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   Assessment and Plan:   Acute lower GI bleed - Large volume bloody bowel movement with clots this morning and associated hypotension.  Stat CT angio noting active extravasation in the colon at the area of the hepatic flexure.  Evaluated by GI here noting poor visibility and outcome with colonoscopy, recommending IR embolization.  Will work on transfer to [facility] to be evaluated by IR.  Hypovolemia with hypotension - Noted this morning after large-volume bleeding.  Blood pressures in the 70s.  S/p 2 L IV fluid bolus with improvement in blood pressure, systolics in the 100s.  No need for vasopressor medications at this time.  Concern for acute on chronic upper GI bleed - Known history of angioectasia with observed frank melena.  Possibly contributing to patient's bleeding as well.  GI consulted and following closely.  Initially preparing to pursue EGD with small bowel follow-through however acute lower GI bleed with hypotension warranting need for IR.    Acute on chronic blood loss anemia - Hemoglobin 5.7 this morning.  Ordered 2 more units PRBCs (total of 3 so far).  Will recheck hemoglobin this afternoon and trend every 4 hours.  Acute kidney injury - Creatinine showing mild improvement.  Likely prerenal etiology given GI bleed and dehydration/GI loss.  IV fluids on board.  Receiving PRBCs.  Will monitor urine output recheck BMP in AM.   Mild hyperkalemia - Potassium 6.1 this morning.  No changes observed on ECG.  Ordered calcium  gluconate, D50/IV insulin , IV fluid bolus, Lasix  x 1.  Pending recheck BMP.   Leukocytosis - No fevers or concern for  infectious etiology, likely stress-induced.  Holding off on antibiotics at this time.  Will recheck CBC in AM.   Diabetes mellitus - insulin  sliding scale.  Currently NPO.   Subjective: Rapid response called this morning due to hypotension and noted large volume bloody stool with clots.  Patient presyncopal/dizzy.  Subsequently laid in Trendelenburg in the bed.  2 L NS bolus ordered with improvement in blood pressure and mentation.  Patient denied any shortness of breath, chest pain, nausea, vomiting, abdominal pain.  Felt a little dizzy.  Receiving the first of 2 units of blood.  Physical Exam:  Vitals:   05/07/24 1039 05/07/24 1055 05/07/24 1056 05/07/24 1127  BP: (!) (P) 131/56 (!) 102/55 (!) 102/55 (!) 102/49  Pulse: (P) 94 (!) 108 (!) 108 (!) 105  Resp: (P) 18 18 18    Temp: (P) 98.6 F (37 C) 98.9 F (37.2 C) 98.9 F (37.2 C)   TempSrc: (P) Oral  Oral   SpO2: (P) 100%  100%   Weight:      Height:        GENERAL:  Alert, pleasant, mild acute distress  HEENT:  EOMI CARDIOVASCULAR: Regular and tachycardic RESPIRATORY:  Clear to auscultation, no wheezing, rales, or rhonchi GASTROINTESTINAL:  Soft, nontender, nondistended EXTREMITIES:  No LE edema bilaterally NEURO:  No new focal deficits appreciated SKIN: Pale, no rashes noted PSYCH:  Appropriate mood and affect     Data Reviewed:  Imaging Studies: CT Angio Abd/Pel w/ and/or w/o Addendum Date: 05/07/2024 ADDENDUM REPORT: 05/07/2024 10:57 ADDENDUM: Correction: The site of active  gastrointestinal bleeding is in the hepatic flexure of the colon, not splenic. Electronically Signed   By: Lynwood Landy Raddle M.D.   On: 05/07/2024 10:57   Addendum Date: 05/07/2024 ADDENDUM REPORT: 05/07/2024 10:41 ADDENDUM: Critical Value/emergent results were called by telephone at the time of interpretation on 05/07/2024 at 10:40 am to provider Bari, charge nurse, who verbally acknowledged these results and will notify Dr. Arlon immediately.  Electronically Signed   By: Lynwood Landy Raddle M.D.   On: 05/07/2024 10:41   Result Date: 05/07/2024 CLINICAL DATA:  Lower gastrointestinal bleeding EXAM: CTA ABDOMEN AND PELVIS WITHOUT AND WITH CONTRAST TECHNIQUE: Multidetector CT imaging of the abdomen and pelvis was performed using the standard protocol during bolus administration of intravenous contrast. Multiplanar reconstructed images and MIPs were obtained and reviewed to evaluate the vascular anatomy. RADIATION DOSE REDUCTION: This exam was performed according to the departmental dose-optimization program which includes automated exposure control, adjustment of the mA and/or kV according to patient size and/or use of iterative reconstruction technique. CONTRAST:  OMNIPAQUE  IOHEXOL  350 MG/ML SOLN COMPARISON:  April 08, 2024 FINDINGS: VASCULAR Aorta: Normal caliber aorta without aneurysm, dissection, vasculitis or significant stenosis. Celiac: Patent without evidence of aneurysm, dissection, vasculitis or significant stenosis. SMA: Patent without evidence of aneurysm, dissection, vasculitis or significant stenosis. Renals: Both renal arteries are patent without evidence of aneurysm, dissection, vasculitis, fibromuscular dysplasia or significant stenosis. IMA: Patent without evidence of aneurysm, dissection, vasculitis or significant stenosis. Inflow: Patent without evidence of aneurysm, dissection, vasculitis or significant stenosis. Proximal Outflow: Bilateral common femoral and visualized portions of the superficial and profunda femoral arteries are patent without evidence of aneurysm, dissection, vasculitis or significant stenosis. Veins: No obvious venous abnormality within the limitations of this arterial phase study. Review of the MIP images confirms the above findings. NON-VASCULAR Lower chest: No acute abnormality. Hepatobiliary: No focal liver abnormality is seen. No gallstones, gallbladder wall thickening, or biliary dilatation. Pancreas:  Unremarkable. No pancreatic ductal dilatation or surrounding inflammatory changes. Spleen: Normal in size without focal abnormality. Adrenals/Urinary Tract: Adrenal glands appear normal. Stable right renal cyst. No hydronephrosis or renal obstruction noted. Urinary bladder is unremarkable. Stomach/Bowel: Stomach is within normal limits. Appendix appears normal. No evidence of bowel wall thickening, distention, or inflammatory changes. However, there appears to be an area active contrast extravasation involving the splenic flexure of the colon consistent with active gastrointestinal bleeding. Lymphatic: No adenopathy. Reproductive: Status post hysterectomy. No adnexal masses. Other: No abdominal wall hernia or abnormality. No abdominopelvic ascites. Musculoskeletal: No acute or significant osseous findings. IMPRESSION: Active contrast extravasation is seen involving the splenic flexure of the colon consistent with active gastrointestinal bleeding. Electronically Signed: By: Lynwood Landy Raddle M.D. On: 05/07/2024 10:33   DG Abd 1 View Result Date: 04/24/2024 CLINICAL DATA:  Retained capsule.  Chronic constipation. EXAM: ABDOMEN - 1 VIEW COMPARISON:  Radiograph 04/11/2024 FINDINGS: The previous retained endoscopy capsule is no longer seen. Moderate volume of colonic stool. No bowel dilatation or evidence of obstruction. No visible radiopaque calculi. Vascular calcifications. No acute osseous findings. IMPRESSION: 1. Previous retained endoscopy capsule is no longer seen. 2. Moderate colonic stool burden. Electronically Signed   By: Andrea Gasman M.D.   On: 04/24/2024 20:36   DG Abd 1 View Result Date: 04/11/2024 CLINICAL DATA:  [id number] Foreign body alimentary tract [id number] EXAM: ABDOMEN - 1 VIEW COMPARISON:  April 08, 2024 FINDINGS: Air-filled nondilated loops of bowel. Air is visualized in the rectum. Endoscopy capsule projects over the LEFT upper  quadrant. Degenerative changes of the lumbar spine.  Atherosclerotic calcifications. IMPRESSION: Endoscopy capsule projects over the LEFT upper quadrant. Electronically Signed   By: Corean Salter M.D.   On: 04/11/2024 12:09   CT ANGIO GI BLEED Result Date: 04/08/2024 EXAM: CTA ABDOMEN AND PELVIS WITH CONTRAST 04/08/2024 11:42:52 AM TECHNIQUE: CTA images of the abdomen and pelvis with intravenous contrast. 100 mL (iohexol  (OMNIPAQUE ) 350 MG/ML injection 100 mL IOHEXOL  350 MG/ML SOLN). Three-dimensional MIP/volume rendered formations were performed. Automated exposure control, iterative reconstruction, and/or weight based adjustment of the mA/kV was utilized to reduce the radiation dose to as low as reasonably achievable. COMPARISON: None available. CLINICAL HISTORY: hypotension, symptomatic anemia with persistent decline in hemoglobin FINDINGS: VASCULATURE: GI BLEED: No evidence of active gastrointestinal bleeding within the stomach, small bowel, or colon. AORTA: No acute finding. No abdominal aortic aneurysm. No dissection. CELIAC TRUNK: No acute finding. No occlusion or significant stenosis. SUPERIOR MESENTERIC ARTERY: No acute finding. No occlusion or significant stenosis. INFERIOR MESENTERIC ARTERY: No acute finding. No occlusion or significant stenosis. RENAL ARTERIES: No acute finding. No occlusion or significant stenosis. ILIAC ARTERIES: No acute finding. No occlusion or significant stenosis. ABDOMEN/PELVIS: LOWER CHEST: Bibasilar interstitial thickening and mild peripheral consolidation potential pulmonary infection or aspiration pneumonitis. LIVER: The liver is unremarkable. GALLBLADDER AND BILE DUCTS: Gallbladder is unremarkable. No biliary ductal dilatation. SPLEEN: The spleen is unremarkable. PANCREAS: The pancreas is unremarkable. ADRENAL GLANDS: Bilateral adrenal glands demonstrate no acute abnormality. KIDNEYS, URETERS AND BLADDER: Adjacent nonenhancing simple fluid attenuation cyst of the right kidney measured 3.6 x 2.4 cm. No follow-up  recommended for benign renal lesion . No stones in the kidneys or ureters. No hydronephrosis. No perinephric or periureteral stranding. Urinary bladder is unremarkable. GI AND BOWEL: Stomach and duodenal sweep demonstrate no acute abnormality. Several diverticula of the left colon without acute inflammation. There is no bowel obstruction. No abnormal bowel wall thickening or distension. REPRODUCTIVE: Post hysterectomy. PERITONEUM AND RETROPERITONEUM: No ascites or free air. LYMPH NODES: No lymphadenopathy. BONES AND SOFT TISSUES: No acute abnormality of the bones. No acute soft tissue abnormality. IMPRESSION: 1. No evidence of active gastrointestinal bleeding. 2. Bibasilar interstitial thickening with mild peripheral consolidation, concerning for pulmonary infection versus aspiration pneumonitis. Electronically signed by: Norleen Boxer MD 04/08/2024 12:13 PM EST RP Workstation: [id number]    Results are pending, will review when available.  Previous records (including but not limited to H&P, progress notes, nursing notes, TOC management) were reviewed in assessment of this patient.  Labs: CBC: Recent Labs  Lab 05/06/24 1147 05/07/24 0429  WBC 14.4* 12.4*  NEUTROABS 9.5*  --   HGB 7.8* 5.7*  HCT 25.2* 18.4*  MCV 90.0 84.4  PLT 300 211   Basic Metabolic Panel: Recent Labs  Lab 05/06/24 1147 05/07/24 0429  NA 140 138  K 5.2* 6.1*  CL 104 107  CO2 20* 26  GLUCOSE 98 153*  BUN 33* 43*  CREATININE 1.30* 1.13*  CALCIUM  9.6 7.9*   Liver Function Tests: Recent Labs  Lab 05/06/24 1147  AST 24  ALT 13  ALKPHOS 81  BILITOT 0.3  PROT 7.2  ALBUMIN 3.9   CBG: Recent Labs  Lab 05/06/24 2137 05/07/24 0743 05/07/24 1106  GLUCAP 124* 207* 192*    Scheduled Meds:  atorvastatin   20 mg Oral Daily   cloNIDine   0.1 mg Oral Daily   insulin  aspart  0-15 Units Subcutaneous TID WC   insulin  aspart  0-5 Units Subcutaneous QHS   pantoprazole  (PROTONIX ) IV  40 mg Intravenous Q12H   sodium  chloride flush  3 mL Intravenous Q12H   Continuous Infusions:  sodium chloride  75 mL/hr at 05/06/24 2307   sodium chloride      PRN Meds:.sodium chloride , acetaminophen  **OR** acetaminophen , hydrALAZINE , HYDROcodone -acetaminophen , ondansetron  **OR** ondansetron  (ZOFRAN ) IV, sodium chloride  flush, traZODone   Family Communication: None at bedside  Disposition: Status is: Inpatient Remains inpatient appropriate because: Acute blood loss anemia, GI bleed     Time spent: 55 minutes  Length of inpatient stay: 1 days  Author: Carliss LELON Canales, DO 05/07/2024 11:51 AM  For on call review [email or web address].

## 2024-05-07 NOTE — Progress Notes (Signed)
 Patient presented yesterday with hematochezia and reported melena which began Monday. History of angiectasias of the stomach and duodenum s/p APC (EGD 04/05/2024), enteroscopy 04/07/2024 with non-bleeding gastric ulcers and duodenal ulcer at post-ablation sites. Colonoscopy 04/07/2024 with with three polyps removed and diverticulosis in sigmoid colon and ascending colon.   She was scheduled for EGD/enteroscopy this morning, however morning labs showed decline in Hgb. On admission her Hgb was 7.8, she received one unit of prbcs and this morning Hgb was 5.7. She has continued to pass large volume blood per rectum. She has been hemodynamically unstable. Blood pressures soft overnight with MAP 59. BP this morning 74/48, HR in the 100s.   STAT CTA A/P showed active GI bleeding at the hepatic flexures. An unprepped colonoscopy would be low yield. Radiologist advising IR for embolization. I personally saw patient this morning and made aware of CT findings and recommendations for IR evaluation/embolization to control bleeding. She is agreeable to move forward with IR intervention.   Sonny RAMAN. Ezzard RIGGERS [facility] [phone] 12/18/202511:56 AM

## 2024-05-07 NOTE — Consult Note (Cosign Needed Addendum)
 Preliminary Note - Patient not yet on site at [facility] formal IR evaluation at this time.   Chief Complaint: SOB and melena  Patient Status: [facility] IP  History of Present Illness: Casey Johnston is a 63 y.o. female history of HTN, DM, GERD, IBS, diverticulosis, iron deficiency anemia, GI bleed from angioectasia, who presented with shortness of breath and melena on 05/06/24 and was subsequently admitted. Notable recent admission for similar. Brief timeline: 04/04/24: seen by [facility] at [facility] for anemia and melena. Received multiple units of PRBCs (9) during hospitalization. 04/05/24: EGD with normal esophagus, four non- bleeding angioectasias in the stomach, treated with argon plasma coagulation (APC). Three recently bleeding angioectasias in the duodenum, treated with APC. 05/06/24: returned to ED hypotensive, + FOB, Hgb 7.8 05/07/24: while admitted at [facility] experienced a significant Hgb drop to 5.7 with hemodynamic instability. CT A A/P showed active GI bleeding at the hepatic flexures. GI notes that an unprepped colonoscopy would be low yield. Team plans to transport to [facility] for IR evaluation/embolization to control bleeding.   Patient is not yet on site to formally evaluate as of 1645 on 05/07/24.  Allergies Reviewed:  Januvia  [sitagliptin ] and Amlodipine    Patient is Full Code  Past Medical History:  Diagnosis Date   Anxiety and depression    Chronic constipation    Diabetes mellitus, type II (HCC)    Dr. Lenis managing   Frequent headaches chronic   ibuprofen helps some   GERD (gastroesophageal reflux disease)    History of UTI    Recurrent per pt (approx 3 per year)   Hyperlipidemia    Hypertension    Leukocytosis 12/2015   mild lymphocytosis.  Path smear review reassuring.  Repeat 01/2016 stable.   Morbid obesity (HCC)    OSA on CPAP    not using CPAP anymore    Past Surgical History:  Procedure Laterality Date   ABDOMINAL HYSTERECTOMY  1998   Dysfunctional  uterine bleeding.  No hx of abnormal paps.   BIOPSY  10/20/2018   Procedure: BIOPSY;  Surgeon: Harvey Margo CROME, MD;  Location: [facility];  Service: Endoscopy;;  stomach   CATARACT EXTRACTION W/PHACO Left 08/24/2022   Procedure: CATARACT EXTRACTION PHACO AND INTRAOCULAR LENS PLACEMENT (IOC);  Surgeon: Harrie Agent, MD;  Location: [facility] ORS;  Service: Ophthalmology;  Laterality: Left;  CDE 8.53   CATARACT EXTRACTION W/PHACO Right 09/07/2022   Procedure: CATARACT EXTRACTION PHACO AND INTRAOCULAR LENS PLACEMENT (IOC);  Surgeon: Harrie Agent, MD;  Location: [facility] ORS;  Service: Ophthalmology;  Laterality: Right;  CDE: 10.02   COLONOSCOPY  2012   Dr. Gaylia: 1.5 cm pendunculated descending colon polyp (tubulovillous adenoma) and diminutive hyperplastic polyps   COLONOSCOPY N/A 10/20/2018   Procedure: COLONOSCOPY;  Surgeon: Harvey Margo CROME, MD; 4 tubular adenomas, diverticulosis in the sigmoid colon, tortuous left colon, external and internal hemorrhoids. Due for repeat in 2023.    COLONOSCOPY N/A 04/07/2024   Procedure: COLONOSCOPY;  Surgeon: Eartha Angelia Sieving, MD;  Location: [facility];  Service: Gastroenterology;  Laterality: N/A;   COLONOSCOPY WITH PROPOFOL  N/A 12/24/2022   Procedure: COLONOSCOPY WITH PROPOFOL ;  Surgeon: Cindie Carlin POUR, DO;  Location: [facility];  Service: Endoscopy;  Laterality: N/A;  11:15 am, asa 3   ENTEROSCOPY N/A 04/11/2024   Procedure: ENTEROSCOPY;  Surgeon: Cindie Carlin POUR, DO;  Location: [facility];  Service: Endoscopy;  Laterality: N/A;   ESOPHAGOGASTRODUODENOSCOPY N/A 10/20/2018   Procedure: ESOPHAGOGASTRODUODENOSCOPY (EGD);  Surgeon: Harvey,  Sandi L, MD;  gastritis and duodenitis. No H. Pylori.    ESOPHAGOGASTRODUODENOSCOPY N/A 04/07/2024   Procedure: EGD (ESOPHAGOGASTRODUODENOSCOPY);  Surgeon: Eartha Flavors, Toribio, MD;  Location: [facility];  Service: Gastroenterology;  Laterality: N/A;  WITH ENTEROSCOPY   ESOPHAGOGASTRODUODENOSCOPY N/A 04/05/2024    Procedure: EGD (ESOPHAGOGASTRODUODENOSCOPY);  Surgeon: Cinderella Deatrice FALCON, MD;  Location: [facility];  Service: Endoscopy;  Laterality: N/A;   GIVENS CAPSULE STUDY N/A 04/09/2024   Procedure: IMAGING PROCEDURE, GI TRACT, INTRALUMINAL, VIA CAPSULE;  Surgeon: Cinderella Deatrice FALCON, MD;  Location[facility];  Service: Endoscopy;  Laterality: N/A;   POLYPECTOMY  10/20/2018   Procedure: POLYPECTOMY;  Surgeon: Harvey Margo CROME, MD;  [facility];  Service: Endoscopy;;   POLYPECTOMY  12/24/2022   Procedure: POLYPECTOMY INTESTINAL;  Surgeon: Cindie Carlin POUR, DO;  Location: [facility];  Service: Endoscopy;;   wart removal  08/04/2019   benign      Medications: Prior to Admission medications  Medication Sig Start Date End Date Taking? Authorizing Provider  atorvastatin  (LIPITOR) 20 MG tablet TAKE 1 Tablet BY MOUTH ONCE EVERY DAY Patient taking differently: Take 20 mg by mouth daily. TAKE 1 Tablet BY MOUTH ONCE EVERY DAY 10/05/21  Yes Comer Kirsch, PA-C  cloNIDine  (CATAPRES ) 0.1 MG tablet Take 1 tablet (0.1 mg total) by mouth 2 (two) times daily. Patient taking differently: Take 0.1 mg by mouth daily. 10/05/21  Yes Comer Kirsch, PA-C  Dulaglutide (TRULICITY) 0.75 MG/0.5ML SOPN Inject 0.75 mg into the skin every Monday.   Yes [provider]  FEROSUL 325 (65 Fe) MG tablet Take 325 mg by mouth daily. 04/24/24  Yes [provider]  linaclotide  (LINZESS ) 72 MCG capsule Take 72 mcg by mouth daily as needed (constipation).   Yes [provider]  losartan -hydrochlorothiazide  (HYZAAR) 100-12.5 MG tablet Take 1 tablet by mouth daily. 11/09/22  Yes [provider]  metFORMIN  (GLUCOPHAGE ) 1000 MG tablet Take 1 tablet (1,000 mg total) by mouth 2 (two) times daily with a meal. 10/05/21  Yes Comer Kirsch, PA-C  pantoprazole  (PROTONIX ) 40 MG tablet Take 1 tablet (40 mg total) by mouth 2 (two) times daily before a meal. 04/12/24  Yes Sigdel, Santosh, MD   glipiZIDE  (GLUCOTROL  XL) 5 MG 24 hr tablet Take 1 tablet (5 mg total) by mouth daily with breakfast. 10/18/22   Nida, Ethelle ORN, MD     Family History  Problem Relation Age of Onset   Cancer Mother        ovarian   COPD Father    Diabetes Father    Hyperlipidemia Father    Heart attack Father    Heart failure Father    Colon cancer Neg Hx     Social History   Socioeconomic History   Marital status: Divorced    Spouse name: Not on file   Number of children: Not on file   Years of education: Not on file   Highest education level: Not on file  Occupational History   Not on file  Tobacco Use   Smoking status: Every Day    Current packs/day: 0.25    Average packs/day: 0.3 packs/day for 20.0 years (5.0 ttl pk-yrs)    Types: Cigarettes   Smokeless tobacco: Never  Vaping Use   Vaping status: Never Used  Substance and Sexual Activity   Alcohol use: No   Drug use: No   Sexual activity: Not on file  Other Topics Concern   Not on file  Social History Narrative   Separated x 20 yrs, one daughter.  She lives with her.  Great niece and nephew live with her as well.   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   Tob: 7 pack year hx, quit 2010.   Alc: none   Exercise: none   Social Drivers of Health   Tobacco Use: High Risk (05/06/2024)   Patient History    Smoking Tobacco Use: Every Day    Smokeless Tobacco Use: Never    Passive Exposure: Not on file  Financial Resource Strain: Low Risk (03/26/2023)   Received from [facility]   Overall Financial Resource Strain (CARDIA)    Difficulty of Paying Living Expenses: Not hard at all  Food Insecurity: No Food Insecurity (05/06/2024)   Epic    Worried About Radiation Protection Practitioner of Food in the Last Year: Never true    Ran Out of Food in the Last Year: Never true  Transportation Needs: No Transportation Needs (05/06/2024)   Epic    Lack of Transportation (Medical): No    Lack of Transportation (Non-Medical): No  Physical Activity:  Sufficiently Active (03/26/2023)   Received from [facility]   Exercise Vital Sign    On average, how many days per week do you engage in moderate to strenuous exercise (like a brisk walk)?: 6 days    On average, how many minutes do you engage in exercise at this level?: 30 min  Stress: Stress Concern Present (03/26/2023)   Received from [facility] - Occupational Stress Questionnaire    Feeling of Stress : To some extent  Social Connections: Moderately Isolated (03/26/2023)   Received from [facility]   Social Connection and Isolation Panel    In a typical week, how many times do you talk on the phone with family, friends, or neighbors?: Once a week    How often do you get together with friends or relatives?: Three times a week    How often do you attend church or religious services?: More than 4 times per year    Do you belong to any clubs or organizations such as church groups, unions, fraternal or athletic groups, or school groups?: No    How often do you attend meetings of the clubs or organizations you belong to?: Never    Are you married, widowed, divorced, separated, never married, or living with a partner?: Divorced  Depression (PHQ2-9): Not on file  Alcohol Screen: Not on file  Housing: Low Risk (05/06/2024)   Epic    Unable to Pay for Housing in the Last Year: No    Number of Times Moved in the Last Year: 0    Homeless in the Last Year: No  Utilities: Not At Risk (05/06/2024)   Epic    Threatened with loss of utilities: No  Health Literacy: Low Risk (03/26/2023)   Received from [facility] Literacy    How often do you need to have someone help you when you read instructions, pamphlets, or other written material from your doctor or pharmacy?: Never       Vital Signs: BP 120/69 (BP Location: Left Arm)   Pulse (!) 104   Temp 98.8 F (37.1 C) (Oral)   Resp 18   Ht 5' 5 (1.651 m)   Wt 195 lb (88.5 kg)    SpO2 (!) 10%   BMI 32.45 kg/m    Imaging: CT Angio Abd/Pel  w/ and/or w/o Addendum Date: 05/07/2024 ADDENDUM REPORT: 05/07/2024 10:57 ADDENDUM: Correction: The site of active gastrointestinal bleeding is in the hepatic flexure of the colon, not splenic. Electronically Signed   By: Lynwood Landy Raddle M.D.   On: 05/07/2024 10:57   Addendum Date: 05/07/2024 ADDENDUM REPORT: 05/07/2024 10:41 ADDENDUM: Critical Value/emergent results were called by telephone at the time of interpretation on 05/07/2024 at 10:40 am to provider Bari, charge nurse, who verbally acknowledged these results and will notify Dr. Arlon immediately. Electronically Signed   By: Lynwood Landy Raddle M.D.   On: 05/07/2024 10:41   Result Date: 05/07/2024 CLINICAL DATA:  Lower gastrointestinal bleeding EXAM: CTA ABDOMEN AND PELVIS WITHOUT AND WITH CONTRAST TECHNIQUE: Multidetector CT imaging of the abdomen and pelvis was performed using the standard protocol during bolus administration of intravenous contrast. Multiplanar reconstructed images and MIPs were obtained and reviewed to evaluate the vascular anatomy. RADIATION DOSE REDUCTION: This exam was performed according to the departmental dose-optimization program which includes automated exposure control, adjustment of the mA and/or kV according to patient size and/or use of iterative reconstruction technique. CONTRAST:  OMNIPAQUE  IOHEXOL  350 MG/ML SOLN COMPARISON:  April 08, 2024 FINDINGS: VASCULAR Aorta: Normal caliber aorta without aneurysm, dissection, vasculitis or significant stenosis. Celiac: Patent without evidence of aneurysm, dissection, vasculitis or significant stenosis. SMA: Patent without evidence of aneurysm, dissection, vasculitis or significant stenosis. Renals: Both renal arteries are patent without evidence of aneurysm, dissection, vasculitis, fibromuscular dysplasia or significant stenosis. IMA: Patent without evidence of aneurysm, dissection, vasculitis or  significant stenosis. Inflow: Patent without evidence of aneurysm, dissection, vasculitis or significant stenosis. Proximal Outflow: Bilateral common femoral and visualized portions of the superficial and profunda femoral arteries are patent without evidence of aneurysm, dissection, vasculitis or significant stenosis. Veins: No obvious venous abnormality within the limitations of this arterial phase study. Review of the MIP images confirms the above findings. NON-VASCULAR Lower chest: No acute abnormality. Hepatobiliary: No focal liver abnormality is seen. No gallstones, gallbladder wall thickening, or biliary dilatation. Pancreas: Unremarkable. No pancreatic ductal dilatation or surrounding inflammatory changes. Spleen: Normal in size without focal abnormality. Adrenals/Urinary Tract: Adrenal glands appear normal. Stable right renal cyst. No hydronephrosis or renal obstruction noted. Urinary bladder is unremarkable. Stomach/Bowel: Stomach is within normal limits. Appendix appears normal. No evidence of bowel wall thickening, distention, or inflammatory changes. However, there appears to be an area active contrast extravasation involving the splenic flexure of the colon consistent with active gastrointestinal bleeding. Lymphatic: No adenopathy. Reproductive: Status post hysterectomy. No adnexal masses. Other: No abdominal wall hernia or abnormality. No abdominopelvic ascites. Musculoskeletal: No acute or significant osseous findings. IMPRESSION: Active contrast extravasation is seen involving the splenic flexure of the colon consistent with active gastrointestinal bleeding. Electronically Signed: By: Lynwood Landy Raddle M.D. On: 05/07/2024 10:33   DG Abd 1 View Result Date: 04/24/2024 CLINICAL DATA:  Retained capsule.  Chronic constipation. EXAM: ABDOMEN - 1 VIEW COMPARISON:  Radiograph 04/11/2024 FINDINGS: The previous retained endoscopy capsule is no longer seen. Moderate volume of colonic stool. No bowel  dilatation or evidence of obstruction. No visible radiopaque calculi. Vascular calcifications. No acute osseous findings. IMPRESSION: 1. Previous retained endoscopy capsule is no longer seen. 2. Moderate colonic stool burden. Electronically Signed   By: Andrea Gasman M.D.   On: 04/24/2024 20:36   DG Abd 1 View Result Date: 04/11/2024 CLINICAL DATA:  [id number] Foreign body alimentary tract [id number] EXAM: ABDOMEN - 1 VIEW COMPARISON:  April 08, 2024 FINDINGS: Air-filled nondilated  loops of bowel. Air is visualized in the rectum. Endoscopy capsule projects over the LEFT upper quadrant. Degenerative changes of the lumbar spine. Atherosclerotic calcifications. IMPRESSION: Endoscopy capsule projects over the LEFT upper quadrant. Electronically Signed   By: Corean Salter M.D.   On: 04/11/2024 12:09   CT ANGIO GI BLEED Result Date: 04/08/2024 EXAM: CTA ABDOMEN AND PELVIS WITH CONTRAST 04/08/2024 11:42:52 AM TECHNIQUE: CTA images of the abdomen and pelvis with intravenous contrast. 100 mL (iohexol  (OMNIPAQUE ) 350 MG/ML injection 100 mL IOHEXOL  350 MG/ML SOLN). Three-dimensional MIP/volume rendered formations were performed. Automated exposure control, iterative reconstruction, and/or weight based adjustment of the mA/kV was utilized to reduce the radiation dose to as low as reasonably achievable. COMPARISON: None available. CLINICAL HISTORY: hypotension, symptomatic anemia with persistent decline in hemoglobin FINDINGS: VASCULATURE: GI BLEED: No evidence of active gastrointestinal bleeding within the stomach, small bowel, or colon. AORTA: No acute finding. No abdominal aortic aneurysm. No dissection. CELIAC TRUNK: No acute finding. No occlusion or significant stenosis. SUPERIOR MESENTERIC ARTERY: No acute finding. No occlusion or significant stenosis. INFERIOR MESENTERIC ARTERY: No acute finding. No occlusion or significant stenosis. RENAL ARTERIES: No acute finding. No occlusion or significant stenosis. ILIAC  ARTERIES: No acute finding. No occlusion or significant stenosis. ABDOMEN/PELVIS: LOWER CHEST: Bibasilar interstitial thickening and mild peripheral consolidation potential pulmonary infection or aspiration pneumonitis. LIVER: The liver is unremarkable. GALLBLADDER AND BILE DUCTS: Gallbladder is unremarkable. No biliary ductal dilatation. SPLEEN: The spleen is unremarkable. PANCREAS: The pancreas is unremarkable. ADRENAL GLANDS: Bilateral adrenal glands demonstrate no acute abnormality. KIDNEYS, URETERS AND BLADDER: Adjacent nonenhancing simple fluid attenuation cyst of the right kidney measured 3.6 x 2.4 cm. No follow-up recommended for benign renal lesion . No stones in the kidneys or ureters. No hydronephrosis. No perinephric or periureteral stranding. Urinary bladder is unremarkable. GI AND BOWEL: Stomach and duodenal sweep demonstrate no acute abnormality. Several diverticula of the left colon without acute inflammation. There is no bowel obstruction. No abnormal bowel wall thickening or distension. REPRODUCTIVE: Post hysterectomy. PERITONEUM AND RETROPERITONEUM: No ascites or free air. LYMPH NODES: No lymphadenopathy. BONES AND SOFT TISSUES: No acute abnormality of the bones. No acute soft tissue abnormality. IMPRESSION: 1. No evidence of active gastrointestinal bleeding. 2. Bibasilar interstitial thickening with mild peripheral consolidation, concerning for pulmonary infection versus aspiration pneumonitis. Electronically signed by: Norleen Boxer MD 04/08/2024 12:13 PM EST RP Workstation: [id number]    Labs:  CBC: Recent Labs    04/10/24 0414 04/11/24 0323 04/12/24 0404 05/06/24 1147 05/07/24 0429 05/07/24 1426  WBC 12.1* 11.4*  --  14.4* 12.4*  --   HGB 8.4* 7.7* 8.0* 7.8* 5.7* 7.3*  HCT 26.3* 24.1* 25.9* 25.2* 18.4* 22.0*  PLT 213 231  --  300 211  --     COAGS: Recent Labs    04/04/24 0621  INR 1.0    BMP: Recent Labs    04/11/24 0323 05/06/24 1147 05/07/24 0429  05/07/24 1426  NA 141 140 138 140  K 4.7 5.2* 6.1* 5.2*  CL 106 104 107 110  CO2 29 20* 26 20*  GLUCOSE 115* 98 153* 167*  BUN <5* 33* 43* 38*  CALCIUM  8.8* 9.6 7.9* 7.6*  CREATININE 0.72 1.30* 1.13* 0.97  GFRNONAA >60 46* 54* >60    LIVER FUNCTION TESTS: Recent Labs    04/04/24 0621 05/06/24 1147  BILITOT 0.2 0.3  AST 22 24  ALT 13 13  ALKPHOS 68 81  PROT 6.7 7.2  ALBUMIN 3.8 3.9  TUMOR MARKERS: No results for input(s): AFPTM, CEA, CA199, CHROMGRNA in the last 8760 hours.  Assessment and Plan:  Patient not yet transferred to [facility] from [facility] to allow for formal evaluation. BP stable without pressors in use. Hgb 7.3 at 1426 post transfusion. Tachycardia remains. CTA reflecting active bleed is now several hours old (0930 today). Once transferred to [facility], will need evaluation to determine appropriateness of moving forward with embolization, primarily continued large volume bloody stools, return of hemodynamic instability, additional transfusion needs. Please contact on call IR attending with emergent needs overnight.   Thank you for allowing our service to participate in Denys F Habig 's care.    Electronically Signed: Laymon Coast, NP   05/07/2024, 4:47 PM

## 2024-05-07 NOTE — Plan of Care (Signed)

## 2024-05-07 NOTE — Progress Notes (Signed)
°   05/07/24 1136  TOC Brief Assessment  Insurance and Status Reviewed  Patient has primary care physician Yes  Home environment has been reviewed From Home  Prior level of function: Independent  Prior/Current Home Services No current home services  Social Drivers of Health Review SDOH reviewed interventions complete  Readmission risk has been reviewed Yes  Transition of care needs no transition of care needs at this time    Inpatient Care Management (ICM) has reviewed patient and no other ICM needs have been identified at this time. We will continue to monitor patient advancement through interdisciplinary progression rounds. If new patient transition needs arise, please place a ICM consult.

## 2024-05-07 NOTE — Progress Notes (Incomplete)
 [facility] night cross cover note:   Regarding this patient was transferred from [facility] for acute lower gastrointestinal bleed, with IR consulting, and having received transfusion of 1 unit prbc at [facility], the patient was subsequently noted to have a decrease in blood pressure to 68/50, with heart rate increasing into the 1 teens, oxygen saturation 97% on room air, associated with development of nausea for which she received dose of prn IV Zofran .  I ordered 1 L lactated ringer  bolus, updated type and screen, and stat transfusion of 1 unit PRBC.   Update: following the 1 liter LR bolus and initiation of prbc transfusion, patient's vital signs appear to be improving, with most recent blood pressure 112/52, associated with interval decline in her sinus tachycardia, with heart rates now into the 90s.   Will continue to transfuse this unit PRBC, and closely follow for ensuing hemoglobin trend as well as ensuing trending vital signs. Post transfusion H&H is scheduled to occur at 0730 this AM.    Update: patient reports some additional nausea at this time, but is not yet eligible for next dose of prn IV Zofran .  I subsequently added as needed IV Compazine  for refractory nausea.  Additionally, will order systolic blood pressures remain in the 1 teens, her heart rate is beginning to increase again, with heart rates down to the low 100s.  I have ordered an additional 500 cc LR bolus to be administered now.    Eva Pore, DO Hospitalist

## 2024-05-07 NOTE — Progress Notes (Signed)
 Hgb this am 5.7 critical called at 0600 B. Jesus NP aware at [phone] Orders placed for blood at 0609 and Blood bank called blood ready at 0640. Pre blood vitals obtained 0649. Blood obtained and started at 0705 and report given to primary Nurse Mliss at bedside. Pt explained signs and symptoms of reaction. Blood started at 120. Pt denies any side effects at 0720 vital check.   05/07/24 0600 05/07/24 0649 05/07/24 0720  Assess: MEWS Score  Temp  --  98.9 F (37.2 C) 99.1 F (37.3 C)  BP  --  (!) 105/45 (!) 101/38  MAP (mmHg)  --  (!) 64 (!) 58  Pulse Rate  --  92 87  Resp  --  18 18  SpO2  --  99 % 100 %  O2 Device  --  Room Air  --   Assess: MEWS Score  MEWS Temp  --  0 0  MEWS Systolic  --  0 0  MEWS Pulse  --  0 0  MEWS RR  --  0 0  MEWS LOC  --  0 0  MEWS Score  --  0 0  MEWS Score Color  --  Landy Landy  Provider Notification  Provider Name/Title Erminio Jesus NP  --   --   Date Provider Notified 05/07/24  --   --   Time Provider Notified [phone]  --   --   Method of Notification  (amion first attempt. Provider responsed within 3 min)  --   --   Notification Reason Critical Result (Hgb 5.7)  --   --   Provider response See new orders (2 units PRBC)  --   --   Date of Provider Response 05/07/24  --   --   Time of Provider Response [phone]  --   --   Assess: SIRS CRITERIA  SIRS Temperature   --  0 0  SIRS Respirations   --  0 0  SIRS Pulse  --  1 0  SIRS WBC  --  1 1  SIRS Score Sum   --  2 1

## 2024-05-07 NOTE — Plan of Care (Signed)
 Pt is alert and oriented x 4. Had 2 incontinent BM dark bloody stool. BP soft at hs. DBP 40's, MAP 59. NP on call Erminio Cone aware. No additional orders. Pt planned for EGD today.  Problem: Education: Goal: Knowledge of General Education information will improve Description: Including pain rating scale, medication(s)/side effects and non-pharmacologic comfort measures Outcome: Progressing   Problem: Health Behavior/Discharge Planning: Goal: Ability to manage health-related needs will improve Outcome: Progressing   Problem: Clinical Measurements: Goal: Ability to maintain clinical measurements within normal limits will improve Outcome: Progressing Goal: Will remain free from infection Outcome: Progressing Goal: Diagnostic test results will improve Outcome: Progressing Goal: Respiratory complications will improve Outcome: Progressing Goal: Cardiovascular complication will be avoided Outcome: Progressing   Problem: Activity: Goal: Risk for activity intolerance will decrease Outcome: Progressing   Problem: Nutrition: Goal: Adequate nutrition will be maintained Outcome: Progressing   Problem: Coping: Goal: Level of anxiety will decrease Outcome: Progressing   Problem: Elimination: Goal: Will not experience complications related to bowel motility Outcome: Progressing Goal: Will not experience complications related to urinary retention Outcome: Progressing   Problem: Pain Managment: Goal: General experience of comfort will improve and/or be controlled Outcome: Progressing   Problem: Safety: Goal: Ability to remain free from injury will improve Outcome: Progressing   Problem: Skin Integrity: Goal: Risk for impaired skin integrity will decrease Outcome: Progressing   Problem: Education: Goal: Ability to describe self-care measures that may prevent or decrease complications (Diabetes Survival Skills Education) will improve Outcome: Progressing   Problem:  Coping: Goal: Ability to adjust to condition or change in health will improve Outcome: Progressing   Problem: Fluid Volume: Goal: Ability to maintain a balanced intake and output will improve Outcome: Progressing   Problem: Health Behavior/Discharge Planning: Goal: Ability to identify and utilize available resources and services will improve Outcome: Progressing Goal: Ability to manage health-related needs will improve Outcome: Progressing   Problem: Metabolic: Goal: Ability to maintain appropriate glucose levels will improve Outcome: Progressing   Problem: Nutritional: Goal: Maintenance of adequate nutrition will improve Outcome: Progressing Goal: Progress toward achieving an optimal weight will improve Outcome: Progressing   Problem: Skin Integrity: Goal: Risk for impaired skin integrity will decrease Outcome: Progressing   Problem: Tissue Perfusion: Goal: Adequacy of tissue perfusion will improve Outcome: Progressing

## 2024-05-07 NOTE — Progress Notes (Signed)
 Gastroenterology Progress Note   Referring Provider: No ref. provider found Primary Care Physician:  Teresa Jenkins Jansky, FNP Primary Gastroenterologist:  Carlin POUR. Cindie, DO Patient ID: Casey Johnston; [id number]; 11/08/1960   Subjective:    Late entry: patient seen 11:44am   Complains of feeling weak. Passed a large amount of dark blood this morning. Rapid response called. Patient placed in trendelenburg initially. She is feeling thirsty. Denies abdominal pain.    Objective:   Vital signs in last 24 hours: Temp:  [98.2 F (36.8 C)-99.2 F (37.3 C)] 98.8 F (37.1 C) (12/18 1357) Pulse Rate:  [74-108] 108 (12/18 1533) Resp:  [12-20] 18 (12/18 1233) BP: (74-128)/(38-78) 109/49 (12/18 1534) SpO2:  [94 %-100 %] 100 % (12/18 1533) Last BM Date : 05/07/24 General:   Alert,  Well-developed, well-nourished, pleasant and cooperative in NAD at this moment. Head:  Normocephalic and atraumatic. Eyes:  Sclera clear, no icterus.   Abdomen:  Soft, nontender and nondistended.  Normal bowel sounds, without guarding, and without rebound.   Extremities:  Without clubbing, deformity or edema. Neurologic:  Alert and  oriented x4;  grossly normal neurologically. Skin:  Intact without significant lesions or rashes. Psych:  Alert and cooperative. Normal mood and affect.  Intake/Output from previous day: 12/17 0701 - 12/18 0700 In: 567.9 [I.V.:567.9] Out: -  Intake/Output this shift: Total I/O In: 3004 [Blood:3004] Out: -   Lab Results: CBC Recent Labs    05/06/24 1147 05/07/24 0429 05/07/24 1426  WBC 14.4* 12.4*  --   HGB 7.8* 5.7* 7.3*  HCT 25.2* 18.4* 22.0*  MCV 90.0 84.4  --   PLT 300 211  --    BMET Recent Labs    05/06/24 1147 05/07/24 0429 05/07/24 1426  NA 140 138 140  K 5.2* 6.1* 5.2*  CL 104 107 110  CO2 20* 26 20*  GLUCOSE 98 153* 167*  BUN 33* 43* 38*  CREATININE 1.30* 1.13* 0.97  CALCIUM  9.6 7.9* 7.6*   LFTs Recent Labs    05/06/24 1147  BILITOT  0.3  ALKPHOS 81  AST 24  ALT 13  PROT 7.2  ALBUMIN 3.9   No results for input(s): LIPASE in the last 72 hours. PT/INR No results for input(s): LABPROT, INR in the last 72 hours.       Imaging Studies: CT Angio Abd/Pel w/ and/or w/o Addendum Date: 05/07/2024 ADDENDUM REPORT: 05/07/2024 10:57 ADDENDUM: Correction: The site of active gastrointestinal bleeding is in the hepatic flexure of the colon, not splenic. Electronically Signed   By: Lynwood Landy Raddle M.D.   On: 05/07/2024 10:57   Addendum Date: 05/07/2024 ADDENDUM REPORT: 05/07/2024 10:41 ADDENDUM: Critical Value/emergent results were called by telephone at the time of interpretation on 05/07/2024 at 10:40 am to provider Bari, charge nurse, who verbally acknowledged these results and will notify Dr. Arlon immediately. Electronically Signed   By: Lynwood Landy Raddle M.D.   On: 05/07/2024 10:41   Result Date: 05/07/2024 CLINICAL DATA:  Lower gastrointestinal bleeding EXAM: CTA ABDOMEN AND PELVIS WITHOUT AND WITH CONTRAST TECHNIQUE: Multidetector CT imaging of the abdomen and pelvis was performed using the standard protocol during bolus administration of intravenous contrast. Multiplanar reconstructed images and MIPs were obtained and reviewed to evaluate the vascular anatomy. RADIATION DOSE REDUCTION: This exam was performed according to the departmental dose-optimization program which includes automated exposure control, adjustment of the mA and/or kV according to patient size and/or use of iterative reconstruction technique. CONTRAST:  100mL OMNIPAQUE  IOHEXOL   350 MG/ML SOLN COMPARISON:  April 08, 2024 FINDINGS: VASCULAR Aorta: Normal caliber aorta without aneurysm, dissection, vasculitis or significant stenosis. Celiac: Patent without evidence of aneurysm, dissection, vasculitis or significant stenosis. SMA: Patent without evidence of aneurysm, dissection, vasculitis or significant stenosis. Renals: Both renal arteries are patent  without evidence of aneurysm, dissection, vasculitis, fibromuscular dysplasia or significant stenosis. IMA: Patent without evidence of aneurysm, dissection, vasculitis or significant stenosis. Inflow: Patent without evidence of aneurysm, dissection, vasculitis or significant stenosis. Proximal Outflow: Bilateral common femoral and visualized portions of the superficial and profunda femoral arteries are patent without evidence of aneurysm, dissection, vasculitis or significant stenosis. Veins: No obvious venous abnormality within the limitations of this arterial phase study. Review of the MIP images confirms the above findings. NON-VASCULAR Lower chest: No acute abnormality. Hepatobiliary: No focal liver abnormality is seen. No gallstones, gallbladder wall thickening, or biliary dilatation. Pancreas: Unremarkable. No pancreatic ductal dilatation or surrounding inflammatory changes. Spleen: Normal in size without focal abnormality. Adrenals/Urinary Tract: Adrenal glands appear normal. Stable right renal cyst. No hydronephrosis or renal obstruction noted. Urinary bladder is unremarkable. Stomach/Bowel: Stomach is within normal limits. Appendix appears normal. No evidence of bowel wall thickening, distention, or inflammatory changes. However, there appears to be an area active contrast extravasation involving the splenic flexure of the colon consistent with active gastrointestinal bleeding. Lymphatic: No adenopathy. Reproductive: Status post hysterectomy. No adnexal masses. Other: No abdominal wall hernia or abnormality. No abdominopelvic ascites. Musculoskeletal: No acute or significant osseous findings. IMPRESSION: Active contrast extravasation is seen involving the splenic flexure of the colon consistent with active gastrointestinal bleeding. Electronically Signed: By: Lynwood Landy Raddle M.D. On: 05/07/2024 10:33   DG Abd 1 View Result Date: 04/24/2024 CLINICAL DATA:  Retained capsule.  Chronic constipation. EXAM:  ABDOMEN - 1 VIEW COMPARISON:  Radiograph 04/11/2024 FINDINGS: The previous retained endoscopy capsule is no longer seen. Moderate volume of colonic stool. No bowel dilatation or evidence of obstruction. No visible radiopaque calculi. Vascular calcifications. No acute osseous findings. IMPRESSION: 1. Previous retained endoscopy capsule is no longer seen. 2. Moderate colonic stool burden. Electronically Signed   By: Andrea Gasman M.D.   On: 04/24/2024 20:36   DG Abd 1 View Result Date: 04/11/2024 CLINICAL DATA:  [id number] Foreign body alimentary tract [id number] EXAM: ABDOMEN - 1 VIEW COMPARISON:  April 08, 2024 FINDINGS: Air-filled nondilated loops of bowel. Air is visualized in the rectum. Endoscopy capsule projects over the LEFT upper quadrant. Degenerative changes of the lumbar spine. Atherosclerotic calcifications. IMPRESSION: Endoscopy capsule projects over the LEFT upper quadrant. Electronically Signed   By: Corean Salter M.D.   On: 04/11/2024 12:09   CT ANGIO GI BLEED Result Date: 04/08/2024 EXAM: CTA ABDOMEN AND PELVIS WITH CONTRAST 04/08/2024 11:42:52 AM TECHNIQUE: CTA images of the abdomen and pelvis with intravenous contrast. 100 mL (iohexol  (OMNIPAQUE ) 350 MG/ML injection 100 mL IOHEXOL  350 MG/ML SOLN). Three-dimensional MIP/volume rendered formations were performed. Automated exposure control, iterative reconstruction, and/or weight based adjustment of the mA/kV was utilized to reduce the radiation dose to as low as reasonably achievable. COMPARISON: None available. CLINICAL HISTORY: hypotension, symptomatic anemia with persistent decline in hemoglobin FINDINGS: VASCULATURE: GI BLEED: No evidence of active gastrointestinal bleeding within the stomach, small bowel, or colon. AORTA: No acute finding. No abdominal aortic aneurysm. No dissection. CELIAC TRUNK: No acute finding. No occlusion or significant stenosis. SUPERIOR MESENTERIC ARTERY: No acute finding. No occlusion or significant  stenosis. INFERIOR MESENTERIC ARTERY: No acute finding. No occlusion or significant stenosis.  RENAL ARTERIES: No acute finding. No occlusion or significant stenosis. ILIAC ARTERIES: No acute finding. No occlusion or significant stenosis. ABDOMEN/PELVIS: LOWER CHEST: Bibasilar interstitial thickening and mild peripheral consolidation potential pulmonary infection or aspiration pneumonitis. LIVER: The liver is unremarkable. GALLBLADDER AND BILE DUCTS: Gallbladder is unremarkable. No biliary ductal dilatation. SPLEEN: The spleen is unremarkable. PANCREAS: The pancreas is unremarkable. ADRENAL GLANDS: Bilateral adrenal glands demonstrate no acute abnormality. KIDNEYS, URETERS AND BLADDER: Adjacent nonenhancing simple fluid attenuation cyst of the right kidney measured 3.6 x 2.4 cm. No follow-up recommended for benign renal lesion . No stones in the kidneys or ureters. No hydronephrosis. No perinephric or periureteral stranding. Urinary bladder is unremarkable. GI AND BOWEL: Stomach and duodenal sweep demonstrate no acute abnormality. Several diverticula of the left colon without acute inflammation. There is no bowel obstruction. No abnormal bowel wall thickening or distension. REPRODUCTIVE: Post hysterectomy. PERITONEUM AND RETROPERITONEUM: No ascites or free air. LYMPH NODES: No lymphadenopathy. BONES AND SOFT TISSUES: No acute abnormality of the bones. No acute soft tissue abnormality. IMPRESSION: 1. No evidence of active gastrointestinal bleeding. 2. Bibasilar interstitial thickening with mild peripheral consolidation, concerning for pulmonary infection versus aspiration pneumonitis. Electronically signed by: Norleen Boxer MD 04/08/2024 12:13 PM EST RP Workstation: [id number]  [2 weeks]  Assessment:   STORMIE VENTOLA is a 63 y.o. year old female with history of type 2 diabetes, hyperlipidemia, hypertension, angiectasia's of the GI tract, GERD who presented to the ED for blood in stools x2 days. Taking protonix   compliantly, GI consulted for further evaluation   Rectal bleeding/anemia: -onset of BRBPR with transition to darker red/black stools, began Monday -denies NSAIDs, ETOH -endorses nausea -Hgb 7.7, 8 --3 weeks ago -Stool heme positive -Recent EGD and Colonoscopy with angiectasias in stomach/duodenum and non-bleeding ulcers in stomach and duodenum post-ablation -reports compliance with at home PPI therapy -large volume bloody stool this morning with clots, hypotension. Rapid response called, trendelenburg in bed. 2L NS bolus ordered. Received 2 units of blood (Hgb 6.7 to 7.3) -STAT CT angio noting active extravasation in the hepatic flexure. Unprepped colonoscopy today would be low yield. IR recommending embolization. Plans for transfer to [facility]. Discussed with patient and she is agreeable. See separate note earlier today.   -pressures are still soft (systolic 98-109), mild tachycardia (HR in 100s)   Per Dr. Cinderella, radiologist is concerned that bleeding at hepatic flexure appears to be bleeding vessel and would benefit from IR evaluation and possible embolization as unprepped colon would be low yield.      Plan:   Evaluation by IR for possible embolization. Keep Hgb >7. Correct hyperkalemia.   LOS: 1 day   Sonny RAMAN. Ezzard RIGGERS [facility] [phone] 12/18/20254:02 PM

## 2024-05-08 ENCOUNTER — Inpatient Hospital Stay (HOSPITAL_COMMUNITY): Admitting: Anesthesiology

## 2024-05-08 ENCOUNTER — Inpatient Hospital Stay (HOSPITAL_COMMUNITY)

## 2024-05-08 ENCOUNTER — Encounter (HOSPITAL_COMMUNITY): Payer: Self-pay | Admitting: Internal Medicine

## 2024-05-08 ENCOUNTER — Encounter (HOSPITAL_COMMUNITY): Admission: EM | Payer: Self-pay | Source: Home / Self Care | Attending: Pulmonary Disease

## 2024-05-08 DIAGNOSIS — N179 Acute kidney failure, unspecified: Secondary | ICD-10-CM | POA: Diagnosis not present

## 2024-05-08 DIAGNOSIS — R578 Other shock: Secondary | ICD-10-CM

## 2024-05-08 DIAGNOSIS — E861 Hypovolemia: Secondary | ICD-10-CM

## 2024-05-08 DIAGNOSIS — F418 Other specified anxiety disorders: Secondary | ICD-10-CM

## 2024-05-08 DIAGNOSIS — D72829 Elevated white blood cell count, unspecified: Secondary | ICD-10-CM

## 2024-05-08 DIAGNOSIS — E119 Type 2 diabetes mellitus without complications: Secondary | ICD-10-CM

## 2024-05-08 DIAGNOSIS — E875 Hyperkalemia: Secondary | ICD-10-CM | POA: Diagnosis not present

## 2024-05-08 DIAGNOSIS — K5521 Angiodysplasia of colon with hemorrhage: Secondary | ICD-10-CM

## 2024-05-08 DIAGNOSIS — I10 Essential (primary) hypertension: Secondary | ICD-10-CM | POA: Diagnosis not present

## 2024-05-08 DIAGNOSIS — F17210 Nicotine dependence, cigarettes, uncomplicated: Secondary | ICD-10-CM

## 2024-05-08 DIAGNOSIS — K922 Gastrointestinal hemorrhage, unspecified: Secondary | ICD-10-CM | POA: Diagnosis not present

## 2024-05-08 HISTORY — PX: IR ANGIOGRAM VISCERAL SELECTIVE: IMG657

## 2024-05-08 HISTORY — PX: RADIOLOGY WITH ANESTHESIA: SHX6223

## 2024-05-08 HISTORY — PX: IR EMBO ART  VEN HEMORR LYMPH EXTRAV  INC GUIDE ROADMAPPING: IMG5450

## 2024-05-08 HISTORY — PX: IR US GUIDE VASC ACCESS RIGHT: IMG2390

## 2024-05-08 HISTORY — PX: IR ANGIOGRAM SELECTIVE EACH ADDITIONAL VESSEL: IMG667

## 2024-05-08 LAB — BASIC METABOLIC PANEL WITH GFR
Anion gap: 9 (ref 5–15)
BUN: 42 mg/dL — ABNORMAL HIGH (ref 8–23)
CO2: 18 mmol/L — ABNORMAL LOW (ref 22–32)
Calcium: 6.9 mg/dL — ABNORMAL LOW (ref 8.9–10.3)
Chloride: 110 mmol/L (ref 98–111)
Creatinine, Ser: 1.15 mg/dL — ABNORMAL HIGH (ref 0.44–1.00)
GFR, Estimated: 53 mL/min — ABNORMAL LOW
Glucose, Bld: 279 mg/dL — ABNORMAL HIGH (ref 70–99)
Potassium: 4.9 mmol/L (ref 3.5–5.1)
Sodium: 137 mmol/L (ref 135–145)

## 2024-05-08 LAB — CBC
HCT: 19.6 % — ABNORMAL LOW (ref 36.0–46.0)
HCT: 33.4 % — ABNORMAL LOW (ref 36.0–46.0)
Hemoglobin: 6.7 g/dL — CL (ref 12.0–15.0)
Hemoglobin: 10.7 g/dL — ABNORMAL LOW (ref 12.0–15.0)
MCH: 30.7 pg (ref 26.0–34.0)
MCH: 31 pg (ref 26.0–34.0)
MCHC: 34.2 g/dL (ref 30.0–36.0)
MCHC: 32 g/dL (ref 30.0–36.0)
MCV: 89.9 fL (ref 80.0–100.0)
MCV: 96.8 fL (ref 80.0–100.0)
Platelets: 100 K/uL — ABNORMAL LOW (ref 150–400)
Platelets: 92 K/uL — ABNORMAL LOW (ref 150–400)
RBC: 2.18 MIL/uL — ABNORMAL LOW (ref 3.87–5.11)
RBC: 3.45 MIL/uL — ABNORMAL LOW (ref 3.87–5.11)
RDW: 15.3 % (ref 11.5–15.5)
RDW: 15.6 % — ABNORMAL HIGH (ref 11.5–15.5)
WBC: 19.8 K/uL — ABNORMAL HIGH (ref 4.0–10.5)
WBC: 24.9 K/uL — ABNORMAL HIGH (ref 4.0–10.5)
nRBC: 0.2 % (ref 0.0–0.2)
nRBC: 0.2 % (ref 0.0–0.2)

## 2024-05-08 LAB — GLUCOSE, CAPILLARY
Glucose-Capillary: 210 mg/dL — ABNORMAL HIGH (ref 70–99)
Glucose-Capillary: 249 mg/dL — ABNORMAL HIGH (ref 70–99)
Glucose-Capillary: 330 mg/dL — ABNORMAL HIGH (ref 70–99)

## 2024-05-08 LAB — HEMOGLOBIN AND HEMATOCRIT, BLOOD
HCT: 21.3 % — ABNORMAL LOW (ref 36.0–46.0)
Hemoglobin: 7.3 g/dL — ABNORMAL LOW (ref 12.0–15.0)

## 2024-05-08 LAB — PREPARE RBC (CROSSMATCH)

## 2024-05-08 MED ORDER — IOHEXOL 300 MG/ML  SOLN
150.0000 mL | Freq: Once | INTRAMUSCULAR | Status: AC | PRN
  Administered 2024-05-08: 105 mL via INTRA_ARTERIAL

## 2024-05-08 MED ORDER — MIDAZOLAM HCL 2 MG/2ML IJ SOLN
INTRAMUSCULAR | Status: AC
  Filled 2024-05-08: qty 2

## 2024-05-08 MED ORDER — SODIUM CHLORIDE 0.9% IV SOLUTION: Freq: Once | INTRAVENOUS | Status: AC

## 2024-05-08 MED ORDER — PANTOPRAZOLE SODIUM 40 MG IV SOLR
40.0000 mg | Freq: Two times a day (BID) | INTRAVENOUS | Status: DC
  Administered 2024-05-08 – 2024-05-13 (×10): 40 mg via INTRAVENOUS
  Filled 2024-05-08 (×10): qty 10

## 2024-05-08 MED ORDER — IOHEXOL 300 MG/ML  SOLN
100.0000 mL | Freq: Once | INTRAMUSCULAR | Status: AC | PRN
  Administered 2024-05-08: 25 mL via INTRA_ARTERIAL

## 2024-05-08 MED ORDER — LACTATED RINGERS IV BOLUS
500.0000 mL | Freq: Once | INTRAVENOUS | Status: AC
  Administered 2024-05-08: 500 mL via INTRAVENOUS

## 2024-05-08 MED ORDER — PROCHLORPERAZINE EDISYLATE 10 MG/2ML IJ SOLN
10.0000 mg | Freq: Four times a day (QID) | INTRAMUSCULAR | Status: DC | PRN
  Administered 2024-05-08 – 2024-05-11 (×2): 10 mg via INTRAVENOUS
  Filled 2024-05-08 (×2): qty 2

## 2024-05-08 MED ORDER — NOREPINEPHRINE 4 MG/250ML-% IV SOLN: 0.0000 ug/min | INTRAVENOUS | Status: DC

## 2024-05-08 MED ORDER — FENTANYL CITRATE (PF) 100 MCG/2ML IJ SOLN
INTRAMUSCULAR | Status: AC
  Filled 2024-05-08: qty 2

## 2024-05-08 MED ORDER — PHENYLEPHRINE 80 MCG/ML (10ML) SYRINGE FOR IV PUSH (FOR BLOOD PRESSURE SUPPORT)
PREFILLED_SYRINGE | INTRAVENOUS | Status: DC | PRN
  Administered 2024-05-08: 240 ug via INTRAVENOUS

## 2024-05-08 MED ORDER — FENTANYL CITRATE (PF) 100 MCG/2ML IJ SOLN
INTRAMUSCULAR | Status: DC | PRN
  Administered 2024-05-08: 50 ug via INTRAVENOUS

## 2024-05-08 MED ORDER — LACTATED RINGERS IV BOLUS
1000.0000 mL | Freq: Once | INTRAVENOUS | Status: AC
  Administered 2024-05-08: 1000 mL via INTRAVENOUS

## 2024-05-08 MED ORDER — PHENYLEPHRINE HCL-NACL 20-0.9 MG/250ML-% IV SOLN
INTRAVENOUS | Status: DC | PRN
  Administered 2024-05-08: 75 ug/min via INTRAVENOUS

## 2024-05-08 MED ORDER — LIDOCAINE HCL 1 % IJ SOLN
INTRAMUSCULAR | Status: AC
  Filled 2024-05-08: qty 20

## 2024-05-08 MED ORDER — VASOPRESSIN 20 UNIT/ML IV SOLN
INTRAVENOUS | Status: DC | PRN
  Administered 2024-05-08 (×3): 1 [IU] via INTRAVENOUS

## 2024-05-08 MED ORDER — CHLORHEXIDINE GLUCONATE CLOTH 2 % EX PADS
6.0000 | MEDICATED_PAD | Freq: Every day | CUTANEOUS | Status: DC
  Administered 2024-05-08 – 2024-05-23 (×16): 6 via TOPICAL

## 2024-05-08 MED ORDER — SODIUM CHLORIDE 0.9 % IV SOLN: INTRAVENOUS | Status: DC

## 2024-05-08 MED ORDER — LIDOCAINE HCL 1 % IJ SOLN
20.0000 mL | Freq: Once | INTRAMUSCULAR | Status: AC
  Administered 2024-05-08: 10 mL

## 2024-05-08 MED ORDER — PROPOFOL 10 MG/ML IV BOLUS
INTRAVENOUS | Status: DC | PRN
  Administered 2024-05-08: 75 ug/kg/min via INTRAVENOUS

## 2024-05-08 MED ORDER — CALCIUM CHLORIDE 10 % IV SOLN
INTRAVENOUS | Status: DC | PRN
  Administered 2024-05-08: 200 mg via INTRAVENOUS

## 2024-05-08 MED ORDER — MIDAZOLAM HCL (PF) 2 MG/2ML IJ SOLN
INTRAMUSCULAR | Status: DC | PRN
  Administered 2024-05-08: 1 mg via INTRAVENOUS

## 2024-05-08 NOTE — Progress Notes (Signed)
 Dr. Raenelle at [facility]

## 2024-05-08 NOTE — Anesthesia Postprocedure Evaluation (Signed)
"   Anesthesia Post Note  Patient: Casey Johnston  Procedure(s) Performed: RADIOLOGY WITH ANESTHESIA     Patient location during evaluation: PACU Anesthesia Type: MAC Level of consciousness: awake and alert Pain management: pain level controlled Vital Signs Assessment: post-procedure vital signs reviewed and stable Respiratory status: spontaneous breathing, nonlabored ventilation, respiratory function stable and patient connected to nasal cannula oxygen Cardiovascular status: stable and blood pressure returned to baseline Postop Assessment: no apparent nausea or vomiting Anesthetic complications: no   No notable events documented.  Last Vitals:  Vitals:   05/08/24 0915 05/08/24 0920  BP:    Pulse: 99 (!) 101  Resp: (!) 22 17  Temp:    SpO2: 100% 99%    Last Pain:  Vitals:   05/08/24 0900  TempSrc:   PainSc: 0-No pain                 Rome Ade      "

## 2024-05-08 NOTE — Progress Notes (Signed)
 9189-  Pt was admitted to 4N ICU, Rexene Blush, PA made aware of Pt's arrival on the unit.. IR team at bedside by 0815. Plan to take Pt to IR to coil within 30 minutes, Pt has been updated by Amy, IR PA. She would like PRBCs started before transfer since Hbg was 6.7 this morning.  0830- Blood started, Dr. Gatha made aware of Hgb of 6.7 and blood started at this time, aware Pt is transporting to IR. No changes at this time.   0845-Pt in IR suite, IR RN Ashura at bedside for report, all questions answered. IR provider also at bedside. Pt left in care of IR team, safety maintained.

## 2024-05-08 NOTE — Progress Notes (Signed)
 Patient is hemodynamically stable postprocedure hemoglobin is 10.7 not needing any vasopressor therapy.  Okay to transfer out of ICU.  I messaged Dr. Raenelle.  He kindly agreed to pick up tomorrow.  Tamela Stakes, MD  Attending Physician, Critical Care Medicine [facility] See Amion for pager If no response to pager, please call [phone] until 7pm After 7pm, Please call E-link [phone]

## 2024-05-08 NOTE — Consult Note (Signed)
 "     Chief Complaint: Patient was seen in consultation today for LGIB Chief Complaint  Patient presents with   Hematochezia   at the request of Arloa Folks NP PCCM  Referring Physician(s): Arloa Folks NP  Supervising Physician: Vanice Revel  Patient Status: [facility] - ED  History of Present Illness: CALIEGH MIDDLEKAUFF is a 63 y.o. 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   Patient was recently admitted at [facility] in November due to anemia and melena, received multiple units of blood and underwent EGD with argon plasma coagulation on 04/05/24.  Patient returned to [facility] ED on 12/17 with hypotension, hgb 7.8 and FOB was positive. While admitted at [facility] experienced a significant Hgb drop to 5.7 with hemodynamic instability. CT A A/P showed active GI bleeding at the hepatic flexures. Patient was transferred to [facility] overnight, IR was consulted angiogram with possible intervention. Case reviewed and approved by Dr. Vanice.   Patient laying in bed, not in acute distress, appears tired. RN at bedside. During patient care, blood noted around the anus.  Denise headache, fever, chills, shortness of breath, cough, chest pain, abdominal pain, nausea ,vomiting.    Past Medical History:  Diagnosis Date   Anxiety and depression    Chronic constipation    Diabetes mellitus, type II (HCC)    Dr. Lenis managing   Frequent headaches chronic   ibuprofen helps some   GERD (gastroesophageal reflux disease)    History of UTI    Recurrent per pt (approx 3 per year)   Hyperlipidemia    Hypertension    Leukocytosis 12/2015   mild lymphocytosis.  Path smear review reassuring.  Repeat 01/2016 stable.   Morbid obesity (HCC)    OSA on CPAP    not using  CPAP anymore    Past Surgical History:  Procedure Laterality Date   ABDOMINAL HYSTERECTOMY  1998   Dysfunctional uterine bleeding.  No hx of abnormal paps.   BIOPSY  10/20/2018   Procedure: BIOPSY;  Surgeon: Harvey Margo CROME, MD;  [facility]: [facility];  Service: Endoscopy;;  stomach   CATARACT EXTRACTION W/PHACO Left 08/24/2022   Procedure: CATARACT EXTRACTION PHACO AND INTRAOCULAR LENS PLACEMENT (IOC);  Surgeon: Harrie Agent, MD;  Location: [facility] ORS;  Service: Ophthalmology;  Laterality: Left;  CDE 8.53   CATARACT EXTRACTION W/PHACO Right 09/07/2022   Procedure: CATARACT EXTRACTION PHACO AND INTRAOCULAR LENS PLACEMENT (IOC);  Surgeon: Harrie Agent, MD;  Location: [facility] ORS;  Service: Ophthalmology;  Laterality: Right;  CDE: 10.02   COLONOSCOPY  2012   Dr. Gaylia: 1.5 cm pendunculated descending colon polyp (tubulovillous adenoma) and diminutive hyperplastic polyps   COLONOSCOPY N/A 10/20/2018   Procedure: COLONOSCOPY;  Surgeon: Harvey Margo CROME, MD; 4 tubular adenomas, diverticulosis in the sigmoid colon, tortuous left colon, external and internal hemorrhoids. Due for repeat in 2023.    COLONOSCOPY N/A 04/07/2024   Procedure: COLONOSCOPY;  Surgeon: Eartha Angelia Sieving, MD;  [facility];  Service: Gastroenterology;  Laterality: N/A;   COLONOSCOPY WITH PROPOFOL  N/A 12/24/2022   Procedure: COLONOSCOPY WITH PROPOFOL ;  Surgeon: Cindie Carlin POUR, DO;  Location: [facility];  Service: Endoscopy;  Laterality: N/A;  11:15 am, asa 3   ENTEROSCOPY N/A 04/11/2024   Procedure: ENTEROSCOPY;  Surgeon:  Cindie Carlin POUR, DO;  [facility];  Service: Endoscopy;  Laterality: N/A;   ESOPHAGOGASTRODUODENOSCOPY N/A 10/20/2018   Procedure: ESOPHAGOGASTRODUODENOSCOPY (EGD);  Surgeon: Harvey Margo CROME, MD;  gastritis and duodenitis. No H. Pylori.    ESOPHAGOGASTRODUODENOSCOPY N/A 04/07/2024   Procedure: EGD (ESOPHAGOGASTRODUODENOSCOPY);  Surgeon: Eartha Flavors, Toribio, MD;  [facility];  Service: Gastroenterology;  Laterality: N/A;  WITH ENTEROSCOPY   ESOPHAGOGASTRODUODENOSCOPY N/A 04/05/2024   Procedure: EGD (ESOPHAGOGASTRODUODENOSCOPY);  Surgeon: Cinderella Deatrice FALCON, MD;  [facility];  Service: Endoscopy;  Laterality: N/A;   GIVENS CAPSULE STUDY N/A 04/09/2024   Procedure: IMAGING PROCEDURE, GI TRACT, INTRALUMINAL, VIA CAPSULE;  Surgeon: Cinderella Deatrice FALCON, MD;  [facility];  Service: Endoscopy;  Laterality: N/A;   POLYPECTOMY  10/20/2018   Procedure: POLYPECTOMY;  Surgeon: Harvey Margo CROME, MD;  [facility];  Service: Endoscopy;;   POLYPECTOMY  12/24/2022   Procedure: POLYPECTOMY INTESTINAL;  Surgeon: Cindie Carlin POUR, DO;  [facility];  Service: Endoscopy;;   wart removal  08/04/2019   benign    Allergies: Januvia  [sitagliptin ] and Amlodipine   Medications: Prior to Admission medications  Medication Sig Start Date End Date Taking? Authorizing Provider  atorvastatin  (LIPITOR) 20 MG tablet TAKE 1 Tablet BY MOUTH ONCE EVERY DAY Patient taking differently: Take 20 mg by mouth daily. TAKE 1 Tablet BY MOUTH ONCE EVERY DAY 10/05/21  Yes Comer Kirsch, PA-C  cloNIDine  (CATAPRES ) 0.1 MG tablet Take 1 tablet (0.1 mg total) by mouth 2 (two) times daily. Patient taking differently: Take 0.1 mg by mouth daily. 10/05/21  Yes Comer Kirsch, PA-C  Dulaglutide (TRULICITY) 0.75 MG/0.5ML SOPN Inject 0.75 mg into the skin every Monday.   Yes [provider]  FEROSUL 325 (65 Fe) MG tablet Take 325 mg by mouth daily. 04/24/24  Yes [provider]  linaclotide  (LINZESS ) 72 MCG capsule Take 72 mcg by mouth daily as needed (constipation).   Yes [provider]  losartan -hydrochlorothiazide  (HYZAAR) 100-12.5 MG tablet Take 1 tablet by mouth daily. 11/09/22  Yes [provider]  metFORMIN  (GLUCOPHAGE ) 1000 MG tablet Take 1 tablet (1,000 mg total) by mouth 2 (two) times daily with a meal. 10/05/21  Yes  Comer Kirsch, PA-C  pantoprazole  (PROTONIX ) 40 MG tablet Take 1 tablet (40 mg total) by mouth 2 (two) times daily before a meal. 04/12/24  Yes Sigdel, Santosh, MD  glipiZIDE  (GLUCOTROL  XL) 5 MG 24 hr tablet Take 1 tablet (5 mg total) by mouth daily with breakfast. 10/18/22   Nida, Ethelle ORN, MD     Family History  Problem Relation Age of Onset   Cancer Mother        ovarian   COPD Father    Diabetes Father    Hyperlipidemia Father    Heart attack Father    Heart failure Father    Colon cancer Neg Hx     Social History   Socioeconomic History   Marital status: Divorced    Spouse name: Not on file   Number of children: Not on file   Years of education: Not on file   Highest education level: Not on file  Occupational History   Not on file  Tobacco Use   Smoking status: Every Day    Current packs/day: 0.25    Average packs/day: 0.3 packs/day for 20.0 years (5.0 ttl pk-yrs)    Types: Cigarettes   Smokeless tobacco: Never  Vaping Use   Vaping status: Never Used  Substance and Sexual Activity   Alcohol use: No   Drug use: No   Sexual activity: Not on file  Other Topics Concern   Not on file  Social History Narrative   Separated x 20 yrs, one daughter.  She lives with her.  Great niece and nephew live with her as well.   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   Tob: 7 pack year hx, quit 2010.   Alc: none   Exercise: none   Social Drivers of Health   Tobacco Use: High Risk (05/08/2024)   Patient History    Smoking Tobacco Use: Every Day    Smokeless Tobacco Use: Never    Passive Exposure: Not on file  Financial Resource Strain: Low Risk (03/26/2023)   Received from [facility]   Overall Financial Resource Strain (CARDIA)    Difficulty of Paying Living Expenses: Not hard at all  Food Insecurity: No Food Insecurity (05/06/2024)   Epic    Worried About Radiation Protection Practitioner of Food in the Last Year: Never true    Ran Out of Food in the Last Year: Never true   Transportation Needs: No Transportation Needs (05/06/2024)   Epic    Lack of Transportation (Medical): No    Lack of Transportation (Non-Medical): No  Physical Activity: Sufficiently Active (03/26/2023)   Received from [facility]   Exercise Vital Sign    On average, how many days per week do you engage in moderate to strenuous exercise (like a brisk walk)?: 6 days    On average, how many minutes do you engage in exercise at this level?: 30 min  Stress: Stress Concern Present (03/26/2023)   Received from [facility] of Occupational Health - Occupational Stress Questionnaire    Feeling of Stress : To some extent  Social Connections: Moderately Isolated (03/26/2023)   Received from [facility]   Social Connection and Isolation Panel    In a typical week, how many times do you talk on the phone with family, friends, or neighbors?: Once a week    How often do you get together with friends or relatives?: Three times a week    How often do you attend church or religious services?: More than 4 times per year    Do you belong to any clubs or organizations such as church groups, unions, fraternal or athletic groups, or school groups?: No    How often do you attend meetings of the clubs or organizations you belong to?: Never    Are you married, widowed, divorced, separated, never married, or living with a partner?: Divorced  Depression (PHQ2-9): Not on file  Alcohol Screen: Not on file  Housing: Low Risk (05/06/2024)   Epic    Unable to Pay for Housing in the Last Year: No    Number of Times Moved in the Last Year: 0    Homeless in the Last Year: No  Utilities: Not At Risk (05/06/2024)   Epic    Threatened with loss of utilities: No  Health Literacy: Low Risk (03/26/2023)   Received from [facility] Literacy    How often do you need to have someone help you when you read instructions, pamphlets, or other written material from your doctor or  pharmacy?: Never     Review of Systems: A 12 point ROS discussed and pertinent positives are indicated in the HPI above.  All other systems are negative.  Vital Signs: BP ROLLEN)  82/69 (BP Location: Left Arm)   Pulse (!) 109   Temp (!) 97.4 F (36.3 C) (Oral)   Resp 19   Ht 5' 5 (1.651 m)   Wt 195 lb (88.5 kg)   SpO2 100%   BMI 32.45 kg/m    Physical Exam Vitals reviewed.  Constitutional:      General: She is not in acute distress.    Appearance: She is not ill-appearing.  HENT:     Head: Normocephalic and atraumatic.     Mouth/Throat:     Mouth: Mucous membranes are moist.     Pharynx: Oropharynx is clear.  Cardiovascular:     Rate and Rhythm: Regular rhythm. Tachycardia present.  Pulmonary:     Effort: Pulmonary effort is normal.     Breath sounds: Normal breath sounds.  Abdominal:     General: Abdomen is flat. Bowel sounds are normal.     Palpations: Abdomen is soft.  Musculoskeletal:     Cervical back: Neck supple.  Skin:    General: Skin is warm and dry.     Coloration: Skin is not jaundiced or pale.  Neurological:     Mental Status: She is alert and oriented to person, place, and time.  Psychiatric:        Mood and Affect: Mood normal.        Behavior: Behavior normal.        Judgment: Judgment normal.     MD Evaluation Airway: WNL Heart: WNL Abdomen: WNL Chest/ Lungs: WNL ASA  Classification: 3 Mallampati/Airway Score: Two  Imaging: CT Angio Abd/Pel w/ and/or w/o Addendum Date: 05/07/2024 ADDENDUM REPORT: 05/07/2024 10:57 ADDENDUM: Correction: The site of active gastrointestinal bleeding is in the hepatic flexure of the colon, not splenic. Electronically Signed   By: Lynwood Landy Raddle M.D.   On: 05/07/2024 10:57   Addendum Date: 05/07/2024 ADDENDUM REPORT: 05/07/2024 10:41 ADDENDUM: Critical Value/emergent results were called by telephone at the time of interpretation on 05/07/2024 at 10:40 am to provider Bari, charge nurse, who verbally  acknowledged these results and will notify Dr. Arlon immediately. Electronically Signed   By: Lynwood Landy Raddle M.D.   On: 05/07/2024 10:41   Result Date: 05/07/2024 CLINICAL DATA:  Lower gastrointestinal bleeding EXAM: CTA ABDOMEN AND PELVIS WITHOUT AND WITH CONTRAST TECHNIQUE: Multidetector CT imaging of the abdomen and pelvis was performed using the standard protocol during bolus administration of intravenous contrast. Multiplanar reconstructed images and MIPs were obtained and reviewed to evaluate the vascular anatomy. RADIATION DOSE REDUCTION: This exam was performed according to the departmental dose-optimization program which includes automated exposure control, adjustment of the mA and/or kV according to patient size and/or use of iterative reconstruction technique. CONTRAST:  OMNIPAQUE  IOHEXOL  350 MG/ML SOLN COMPARISON:  April 08, 2024 FINDINGS: VASCULAR Aorta: Normal caliber aorta without aneurysm, dissection, vasculitis or significant stenosis. Celiac: Patent without evidence of aneurysm, dissection, vasculitis or significant stenosis. SMA: Patent without evidence of aneurysm, dissection, vasculitis or significant stenosis. Renals: Both renal arteries are patent without evidence of aneurysm, dissection, vasculitis, fibromuscular dysplasia or significant stenosis. IMA: Patent without evidence of aneurysm, dissection, vasculitis or significant stenosis. Inflow: Patent without evidence of aneurysm, dissection, vasculitis or significant stenosis. Proximal Outflow: Bilateral common femoral and visualized portions of the superficial and profunda femoral arteries are patent without evidence of aneurysm, dissection, vasculitis or significant stenosis. Veins: No obvious venous abnormality within the limitations of this arterial phase study. Review of the MIP images confirms the above findings. NON-VASCULAR  Lower chest: No acute abnormality. Hepatobiliary: No focal liver abnormality is seen. No  gallstones, gallbladder wall thickening, or biliary dilatation. Pancreas: Unremarkable. No pancreatic ductal dilatation or surrounding inflammatory changes. Spleen: Normal in size without focal abnormality. Adrenals/Urinary Tract: Adrenal glands appear normal. Stable right renal cyst. No hydronephrosis or renal obstruction noted. Urinary bladder is unremarkable. Stomach/Bowel: Stomach is within normal limits. Appendix appears normal. No evidence of bowel wall thickening, distention, or inflammatory changes. However, there appears to be an area active contrast extravasation involving the splenic flexure of the colon consistent with active gastrointestinal bleeding. Lymphatic: No adenopathy. Reproductive: Status post hysterectomy. No adnexal masses. Other: No abdominal wall hernia or abnormality. No abdominopelvic ascites. Musculoskeletal: No acute or significant osseous findings. IMPRESSION: Active contrast extravasation is seen involving the splenic flexure of the colon consistent with active gastrointestinal bleeding. Electronically Signed: By: Lynwood Landy Raddle M.D. On: 05/07/2024 10:33   DG Abd 1 View Result Date: 04/24/2024 CLINICAL DATA:  Retained capsule.  Chronic constipation. EXAM: ABDOMEN - 1 VIEW COMPARISON:  Radiograph 04/11/2024 FINDINGS: The previous retained endoscopy capsule is no longer seen. Moderate volume of colonic stool. No bowel dilatation or evidence of obstruction. No visible radiopaque calculi. Vascular calcifications. No acute osseous findings. IMPRESSION: 1. Previous retained endoscopy capsule is no longer seen. 2. Moderate colonic stool burden. Electronically Signed   By: Andrea Gasman M.D.   On: 04/24/2024 20:36   DG Abd 1 View Result Date: 04/11/2024 CLINICAL DATA:  [id number] Foreign body alimentary tract [id number] EXAM: ABDOMEN - 1 VIEW COMPARISON:  April 08, 2024 FINDINGS: Air-filled nondilated loops of bowel. Air is visualized in the rectum. Endoscopy capsule projects over the  LEFT upper quadrant. Degenerative changes of the lumbar spine. Atherosclerotic calcifications. IMPRESSION: Endoscopy capsule projects over the LEFT upper quadrant. Electronically Signed   By: Corean Salter M.D.   On: 04/11/2024 12:09   CT ANGIO GI BLEED Result Date: 04/08/2024 EXAM: CTA ABDOMEN AND PELVIS WITH CONTRAST 04/08/2024 11:42:52 AM TECHNIQUE: CTA images of the abdomen and pelvis with intravenous contrast. 100 mL (iohexol  (OMNIPAQUE ) 350 MG/ML injection 100 mL IOHEXOL  350 MG/ML SOLN). Three-dimensional MIP/volume rendered formations were performed. Automated exposure control, iterative reconstruction, and/or weight based adjustment of the mA/kV was utilized to reduce the radiation dose to as low as reasonably achievable. COMPARISON: None available. CLINICAL HISTORY: hypotension, symptomatic anemia with persistent decline in hemoglobin FINDINGS: VASCULATURE: GI BLEED: No evidence of active gastrointestinal bleeding within the stomach, small bowel, or colon. AORTA: No acute finding. No abdominal aortic aneurysm. No dissection. CELIAC TRUNK: No acute finding. No occlusion or significant stenosis. SUPERIOR MESENTERIC ARTERY: No acute finding. No occlusion or significant stenosis. INFERIOR MESENTERIC ARTERY: No acute finding. No occlusion or significant stenosis. RENAL ARTERIES: No acute finding. No occlusion or significant stenosis. ILIAC ARTERIES: No acute finding. No occlusion or significant stenosis. ABDOMEN/PELVIS: LOWER CHEST: Bibasilar interstitial thickening and mild peripheral consolidation potential pulmonary infection or aspiration pneumonitis. LIVER: The liver is unremarkable. GALLBLADDER AND BILE DUCTS: Gallbladder is unremarkable. No biliary ductal dilatation. SPLEEN: The spleen is unremarkable. PANCREAS: The pancreas is unremarkable. ADRENAL GLANDS: Bilateral adrenal glands demonstrate no acute abnormality. KIDNEYS, URETERS AND BLADDER: Adjacent nonenhancing simple fluid attenuation cyst  of the right kidney measured 3.6 x 2.4 cm. No follow-up recommended for benign renal lesion . No stones in the kidneys or ureters. No hydronephrosis. No perinephric or periureteral stranding. Urinary bladder is unremarkable. GI AND BOWEL: Stomach and duodenal sweep demonstrate no acute abnormality. Several diverticula of the  left colon without acute inflammation. There is no bowel obstruction. No abnormal bowel wall thickening or distension. REPRODUCTIVE: Post hysterectomy. PERITONEUM AND RETROPERITONEUM: No ascites or free air. LYMPH NODES: No lymphadenopathy. BONES AND SOFT TISSUES: No acute abnormality of the bones. No acute soft tissue abnormality. IMPRESSION: 1. No evidence of active gastrointestinal bleeding. 2. Bibasilar interstitial thickening with mild peripheral consolidation, concerning for pulmonary infection versus aspiration pneumonitis. Electronically signed by: Norleen Boxer MD 04/08/2024 12:13 PM EST RP Workstation: [id number]    Labs:  CBC: Recent Labs    04/11/24 0323 04/12/24 0404 05/06/24 1147 05/07/24 0429 05/07/24 1426 05/07/24 1636 05/08/24 0000 05/08/24 0715  WBC 11.4*  --  14.4* 12.4*  --   --   --  19.8*  HGB 7.7*   < > 7.8* 5.7* 7.3* 6.2* 7.3* 6.7*  HCT 24.1*   < > 25.2* 18.4* 22.0* 18.6* 21.3* 19.6*  PLT 231  --  300 211  --   --   --  100*   < > = values in this interval not displayed.    COAGS: Recent Labs    04/04/24 0621  INR 1.0    BMP: Recent Labs    04/11/24 0323 05/06/24 1147 05/07/24 0429 05/07/24 1426  NA 141 140 138 140  K 4.7 5.2* 6.1* 5.2*  CL 106 104 107 110  CO2 29 20* 26 20*  GLUCOSE 115* 98 153* 167*  BUN <5* 33* 43* 38*  CALCIUM  8.8* 9.6 7.9* 7.6*  CREATININE 0.72 1.30* 1.13* 0.97  GFRNONAA >60 46* 54* >60    LIVER FUNCTION TESTS: Recent Labs    04/04/24 0621 05/06/24 1147  BILITOT 0.2 0.3  AST 22 24  ALT 13 13  ALKPHOS 68 81  PROT 6.7 7.2  ALBUMIN 3.8 3.9    TUMOR MARKERS: No results for input(s): AFPTM,  CEA, CA199, CHROMGRNA in the last 8760 hours.  Assessment and Plan: 63 y.o. 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   NPO since MN VS 82/69 CBC 6.7, 2 units of blood ordered RF yesterday BUN 38, creatinine 0.96, GFR >60 AC/AP: none  Allergies reviewed   The Risks and benefits of mesenteric angiogram with possible embolization were discussed with the patient including, but not limited to bleeding, infection, vascular injury, post operative pain, or contrast induced renal failure.  This procedure involves the use of X-rays and because of the nature of the planned procedure, it is possible that we will have prolonged use of X-ray fluoroscopy.  Potential radiation risks to you include (but are not limited to) the following: - A slightly elevated risk for cancer several years later in life. This risk is typically less than 0.5% percent. This risk is low in comparison to the normal incidence of human cancer, which is 33% for women and 50% for men according to the [employer] Society. - Radiation induced injury can include skin redness, resembling a rash, tissue breakdown / ulcers and hair loss (which can be temporary or permanent).   The likelihood of either of these occurring depends on the difficulty of the procedure and whether you are sensitive to radiation due to previous procedures, disease, or genetic conditions.   IF your procedure requires a prolonged use of radiation, you will be notified and given written instructions for further action.  It is your responsibility to monitor the irradiated area for the 2 weeks following the procedure and to notify your physician if you  are concerned that you have suffered a radiation induced injury.    All of the patient's questions were answered, patient is agreeable to proceed. Consent signed and in chart.   Thank you for this interesting consult.  I greatly  enjoyed meeting Octa F Rising and look forward to participating in their care.  A copy of this report was sent to the requesting provider on this date.  Electronically Signed: Toya VEAR Cousin, PA-C 05/08/2024, 8:27 AM   I spent a total of 40 Minutes    in face to face in clinical consultation, greater than 50% of which was counseling/coordinating care for mesenteric angiogram with possible embolization.   This chart was dictated using voice recognition software.  Despite best efforts to proofread,  errors can occur which can change the documentation meaning.   "

## 2024-05-08 NOTE — Progress Notes (Signed)
 Patient is A&O x4, and  c/o nausea and weakness. Zofran  not available to be given. Dr. Marcene notified and new orders placed.

## 2024-05-08 NOTE — Progress Notes (Signed)
 "                        PROGRESS NOTE        PATIENT DETAILS Name: Casey Johnston Age: 63 y.o. Sex: female Date of Birth: [birth year] Admit Date: 05/06/2024 Admitting Physician Carliss LELON Canales, DO ERE:Typuz, Jenkins Jansky, FNP  Brief Summary: Patient is a 63 y.o.  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  Significant events: 12/17>> admit to [facility] at [facility]-GI bleed with ABLA/AKI 12/18>> transferred to [facility] for embolization 12/19>> ongoing GI bleed with hypotension in spite of IV fluid boluses/numerous units of PRBC-transferred to ICU  Significant studies: 12/18>> CT angio GI bleed study: Active extravasation hepatic flexure of the colon  Significant microbiology data: None  Procedures: None  Consults: GI PCCM IR  Subjective: Numerous episodes of large-volume hematochezia overnight-blood pressure in the 80s systolic this morning.  Looks comfortable-pale-not in any distress.  Objective: Vitals: Blood pressure (!) 99/48, pulse 98, temperature 97.7 F (36.5 C), temperature source Oral, resp. rate 19, height 5' 5 (1.651 m), weight 88.5 kg, SpO2 98%.   Exam: Gen Exam:Alert awake-not in any distress HEENT:atraumatic, normocephalic Chest: B/L clear to auscultation anteriorly CVS:S1S2 regular Abdomen:soft non tender, non distended Extremities:no edema Neurology: Non focal-generalized weakness Skin: no rash  Pertinent Labs/Radiology:    Latest Ref Rng & Units 05/07/2024    4:36 PM 05/07/2024    2:26 PM 05/07/2024    4:29 AM  CBC  WBC 4.0 - 10.5 K/uL   12.4   Hemoglobin 12.0 - 15.0 g/dL 6.2  7.3  5.7   Hematocrit 36.0 - 46.0 % 18.6  22.0  18.4   Platelets 150 - 400 K/uL   211     Lab Results  Component Value Date   NA 140 05/07/2024   K 5.2 (H) 05/07/2024   CL 110 05/07/2024   CO2 20 (L) 05/07/2024      Assessment/Plan: Hemorrhagic shock secondary to GI bleed and acute blood loss  anemia IV fluid boluses ongoing Given numerous units of PRBC in the past 24 hours-stat CBC ordered-awaiting results. PCCM consulted this morning-patient will be transferred -May require additional units of PRBC and vasopressor support-IR evaluating this morning for embolization. Follow CBC  AKI Hemodynamically mediated-improved Awaiting morning labs  Hyperkalemia Mild Await morning labs  DM-2 CBG stable-SSI  HTN Hold antihypertensives  HLD Statin   Class 1 Obesity: Estimated body mass index is 32.45 kg/m as calculated from the following:   Height as of this encounter: 5' 5 (1.651 m).   Weight as of this encounter: 88.5 kg.    The patient is critically ill with multiple organ system failure and requires high complexity decision making for assessment and support, frequent evaluation and titration of therapies, advanced monitoring, review of radiographic studies and interpretation of complex data.   Total Critical time spent equals 45 minutes  Code status:   Code Status: Full Code   DVT Prophylaxis: SCDs Start: 05/06/24 1732   Family Communication: None at bedside   Disposition Plan: Status is: Inpatient Remains inpatient appropriate because: Severity of illness   Planned Discharge Destination: To be determined depending on clinical course   Diet: Diet Order             Diet NPO time specified Except for: Sips with Meds  Diet effective midnight  Antimicrobial agents: Anti-infectives (From admission, onward)    None        MEDICATIONS: Scheduled Meds:  atorvastatin   20 mg Oral Daily   cloNIDine   0.1 mg Oral Daily   insulin  aspart  0-15 Units Subcutaneous TID WC   insulin  aspart  0-5 Units Subcutaneous QHS   pantoprazole  (PROTONIX ) IV  40 mg Intravenous Q12H   sodium chloride  flush  3 mL Intravenous Q12H   Continuous Infusions: PRN Meds:.acetaminophen  **OR** acetaminophen , hydrALAZINE , HYDROcodone -acetaminophen ,  ondansetron  **OR** ondansetron  (ZOFRAN ) IV, prochlorperazine , sodium chloride  flush, traZODone    I have personally reviewed following labs and imaging studies  LABORATORY DATA: CBC: Recent Labs  Lab 05/06/24 1147 05/07/24 0429 05/07/24 1426 05/07/24 1636  WBC 14.4* 12.4*  --   --   NEUTROABS 9.5*  --   --   --   HGB 7.8* 5.7* 7.3* 6.2*  HCT 25.2* 18.4* 22.0* 18.6*  MCV 90.0 84.4  --   --   PLT 300 211  --   --     Basic Metabolic Panel: Recent Labs  Lab 05/06/24 1147 05/07/24 0429 05/07/24 1426  NA 140 138 140  K 5.2* 6.1* 5.2*  CL 104 107 110  CO2 20* 26 20*  GLUCOSE 98 153* 167*  BUN 33* 43* 38*  CREATININE 1.30* 1.13* 0.97  CALCIUM  9.6 7.9* 7.6*    GFR: Estimated Creatinine Clearance: 64.4 mL/min (by C-G formula based on SCr of 0.97 mg/dL).  Liver Function Tests: Recent Labs  Lab 05/06/24 1147  AST 24  ALT 13  ALKPHOS 81  BILITOT 0.3  PROT 7.2  ALBUMIN 3.9   No results for input(s): LIPASE, AMYLASE in the last 168 hours. No results for input(s): AMMONIA in the last 168 hours.  Coagulation Profile: No results for input(s): INR, PROTIME in the last 168 hours.  Cardiac Enzymes: No results for input(s): CKTOTAL, CKMB, CKMBINDEX, TROPONINI in the last 168 hours.  BNP (last 3 results) No results for input(s): PROBNP in the last 8760 hours.  Lipid Profile: No results for input(s): CHOL, HDL, LDLCALC, TRIG, CHOLHDL, LDLDIRECT in the last 72 hours.  Thyroid  Function Tests: No results for input(s): TSH, T4TOTAL, FREET4, T3FREE, THYROIDAB in the last 72 hours.  Anemia Panel: Recent Labs    05/06/24 1147 05/07/24 0429  VITAMINB12 561  --   FOLATE  --  13.5  FERRITIN 19  --   TIBC 400  --   IRON 107  --   RETICCTPCT 3.8*  --     Urine analysis:    Component Value Date/Time   COLORURINE AMBER (A) 04/04/2024 0934   APPEARANCEUR CLOUDY (A) 04/04/2024 0934   LABSPEC 1.019 04/04/2024 0934   PHURINE 5.0  04/04/2024 0934   GLUCOSEU NEGATIVE 04/04/2024 0934   HGBUR LARGE (A) 04/04/2024 0934   BILIRUBINUR NEGATIVE 04/04/2024 0934   KETONESUR NEGATIVE 04/04/2024 0934   PROTEINUR NEGATIVE 04/04/2024 0934   NITRITE NEGATIVE 04/04/2024 0934   LEUKOCYTESUR NEGATIVE 04/04/2024 0934    Sepsis Labs: Lactic Acid, Venous    Component Value Date/Time   LATICACIDVEN 2.9 (HH) 04/04/2024 0624    MICROBIOLOGY: Recent Results (from the past 240 hours)  MRSA Next Gen by PCR, Nasal     Status: None   Collection Time: 05/07/24  9:33 PM   Specimen: Nasal Mucosa; Nasal Swab  Result Value Ref Range Status   MRSA by PCR Next Gen NOT DETECTED NOT DETECTED Final    Comment: (NOTE) The GeneXpert MRSA Assay (FDA approved  for NASAL specimens only), is one component of a comprehensive MRSA colonization surveillance program. It is not intended to diagnose MRSA infection nor to guide or monitor treatment for MRSA infections. Test performance is not FDA approved in patients less than 17 years old. Performed at [facility], [address] [place], [place] [address]     RADIOLOGY STUDIES/RESULTS: CT Angio Abd/Pel w/ and/or w/o Addendum Date: 05/07/2024 ADDENDUM REPORT: 05/07/2024 10:57 ADDENDUM: Correction: The site of active gastrointestinal bleeding is in the hepatic flexure of the colon, not splenic. Electronically Signed   By: Lynwood Landy Raddle M.D.   On: 05/07/2024 10:57   Addendum Date: 05/07/2024 ADDENDUM REPORT: 05/07/2024 10:41 ADDENDUM: Critical Value/emergent results were called by telephone at the time of interpretation on 05/07/2024 at 10:40 am to provider Bari, charge nurse, who verbally acknowledged these results and will notify Dr. Arlon immediately. Electronically Signed   By: Lynwood Landy Raddle M.D.   On: 05/07/2024 10:41   Result Date: 05/07/2024 CLINICAL DATA:  Lower gastrointestinal bleeding EXAM: CTA ABDOMEN AND PELVIS WITHOUT AND WITH CONTRAST TECHNIQUE: Multidetector CT imaging  of the abdomen and pelvis was performed using the standard protocol during bolus administration of intravenous contrast. Multiplanar reconstructed images and MIPs were obtained and reviewed to evaluate the vascular anatomy. RADIATION DOSE REDUCTION: This exam was performed according to the departmental dose-optimization program which includes automated exposure control, adjustment of the mA and/or kV according to patient size and/or use of iterative reconstruction technique. CONTRAST:  OMNIPAQUE  IOHEXOL  350 MG/ML SOLN COMPARISON:  April 08, 2024 FINDINGS: VASCULAR Aorta: Normal caliber aorta without aneurysm, dissection, vasculitis or significant stenosis. Celiac: Patent without evidence of aneurysm, dissection, vasculitis or significant stenosis. SMA: Patent without evidence of aneurysm, dissection, vasculitis or significant stenosis. Renals: Both renal arteries are patent without evidence of aneurysm, dissection, vasculitis, fibromuscular dysplasia or significant stenosis. IMA: Patent without evidence of aneurysm, dissection, vasculitis or significant stenosis. Inflow: Patent without evidence of aneurysm, dissection, vasculitis or significant stenosis. Proximal Outflow: Bilateral common femoral and visualized portions of the superficial and profunda femoral arteries are patent without evidence of aneurysm, dissection, vasculitis or significant stenosis. Veins: No obvious venous abnormality within the limitations of this arterial phase study. Review of the MIP images confirms the above findings. NON-VASCULAR Lower chest: No acute abnormality. Hepatobiliary: No focal liver abnormality is seen. No gallstones, gallbladder wall thickening, or biliary dilatation. Pancreas: Unremarkable. No pancreatic ductal dilatation or surrounding inflammatory changes. Spleen: Normal in size without focal abnormality. Adrenals/Urinary Tract: Adrenal glands appear normal. Stable right renal cyst. No hydronephrosis or renal  obstruction noted. Urinary bladder is unremarkable. Stomach/Bowel: Stomach is within normal limits. Appendix appears normal. No evidence of bowel wall thickening, distention, or inflammatory changes. However, there appears to be an area active contrast extravasation involving the splenic flexure of the colon consistent with active gastrointestinal bleeding. Lymphatic: No adenopathy. Reproductive: Status post hysterectomy. No adnexal masses. Other: No abdominal wall hernia or abnormality. No abdominopelvic ascites. Musculoskeletal: No acute or significant osseous findings. IMPRESSION: Active contrast extravasation is seen involving the splenic flexure of the colon consistent with active gastrointestinal bleeding. Electronically Signed: By: Lynwood Landy Raddle M.D. On: 05/07/2024 10:33     LOS: 2 days   Donalda Applebaum, MD  [facility]    To contact the attending provider between 7A-7P or the covering provider during after hours 7P-7A, please log into the web site [email or web address] and access using universal [place] password for that web site. If  you do not have the password, please call the hospital operator.  05/08/2024, 7:13 AM    "

## 2024-05-08 NOTE — Anesthesia Preprocedure Evaluation (Signed)
 "                                  Anesthesia Evaluation  Patient identified by MRN, date of birth, ID band Patient awake  General Assessment Comment:  Active GI bleed, being transfused. Patient AO x 3 but appears tired.  Reviewed: Allergy & Precautions, NPO status , Patient's Chart, lab work & pertinent test results  History of Anesthesia Complications Negative for: history of anesthetic complications  Airway Mallampati: III  TM Distance: >3 FB Neck ROM: Full    Dental  (+) Teeth Intact   Pulmonary sleep apnea , neg COPD, Current Smoker and Patient abstained from smoking.   Pulmonary exam normal breath sounds clear to auscultation       Cardiovascular Exercise Tolerance: Good METShypertension, Pt. on medications (-) CAD and (-) Past MI (-) dysrhythmias  Rhythm:Regular Rate:Normal - Systolic murmurs    Neuro/Psych  Headaches PSYCHIATRIC DISORDERS Anxiety Depression       GI/Hepatic ,GERD  Medicated,,(+)     (-) substance abuse    Endo/Other  diabetes  GLP1ra last taken 4 days ago. Denies GI symptoms.  Renal/GU negative Renal ROS     Musculoskeletal   Abdominal  (+) + obese  Peds  Hematology  (+) Blood dyscrasia, anemia   Anesthesia Other Findings Past Medical History: No date: Anxiety and depression No date: Chronic constipation No date: Diabetes mellitus, type II (HCC)     Comment:  Dr. Lenis managing chronic: Frequent headaches     Comment:  ibuprofen helps some No date: GERD (gastroesophageal reflux disease) No date: History of UTI     Comment:  Recurrent per pt (approx 3 per year) No date: Hyperlipidemia No date: Hypertension 12/2015: Leukocytosis     Comment:  mild lymphocytosis.  Path smear review reassuring.                Repeat 01/2016 stable. No date: Morbid obesity (HCC) No date: OSA on CPAP     Comment:  not using CPAP anymore  Reproductive/Obstetrics                              Anesthesia  Physical Anesthesia Plan  ASA: 4 and emergent  Anesthesia Plan: MAC   Post-op Pain Management: Minimal or no pain anticipated   Induction: Intravenous  PONV Risk Score and Plan: 2 and Propofol  infusion, TIVA, Ondansetron , Treatment may vary due to age or medical condition, Midazolam  and Dexamethasone   Airway Management Planned: Nasal Cannula and Natural Airway  Additional Equipment: None  Intra-op Plan:   Post-operative Plan:   Informed Consent: I have reviewed the patients History and Physical, chart, labs and discussed the procedure including the risks, benefits and alternatives for the proposed anesthesia with the patient or authorized representative who has indicated his/her understanding and acceptance.     Dental advisory given  Plan Discussed with: CRNA and Surgeon  Anesthesia Plan Comments: (IR desires attempt at [facility] if possible. Patient currently takes a GLP-1 agonist medication. The latest guidelines from American Society of Anesthesiologists no longer make a blanket recommendation for cessation of weekly injectable medication for one week prior to elective procedure, and of daily medication for one day prior to elective procedure, but rather suggest patient factors and clinical judgement should be taken into account. If these guidelines have not been adhered to, a risk/benefit discussion  should be had with patient, and clinical judgement used. GLP1ra last taken 4 days ago, denies GI symptoms today, diabetes reasonably controlled. Will proceed with natural airway with close monitoring of airway.  Discussed risks of anesthesia with patient, including possibility of difficulty with spontaneous ventilation under anesthesia necessitating airway intervention, PONV, and rare risks such as cardiac or respiratory or neurological events, and allergic reactions. Discussed the role of CRNA in patient's perioperative care. Patient understands.)         Anesthesia Quick  Evaluation  "

## 2024-05-08 NOTE — Consult Note (Signed)
 "  NAME:  Casey Johnston, MRN:  [id number], DOB:  1961/04/18, LOS: 2 ADMISSION DATE:  05/06/2024, CONSULTATION DATE:  05/08/24 REFERRING MD:  TRH, CHIEF COMPLAINT: Active GI bleed  History of Present Illness:  Casey Johnston is a 63 year old female with a past medical history significant for type 2 diabetes, HTN, HLD, OSA on no longer utilizing CPAP, GERD and anxiety who presented to the ED at [facility] 12/17 for complaints of shortness of breath and hematochezia.  Of note patient has history of gastric and small bowel AVMs with active GI bleed in the past, recent colonoscopy with diverticular disease.  Patient was admitted per hospitalist.  Patient seen with active bleed overnight 12/19 with resultant hypotension and tachycardia resulting in transfer to ICU for close monitoring  Pertinent  Medical History  Type 2 diabetes, HTN, HLD, OSA on no longer utilizing CPAP, GERD and anxiety  Significant Hospital Events: Including procedures, antibiotic start and stop dates in addition to other pertinent events   12/17 presented to [name] ED with hematochezia and shortness of breath 12/18 CT angio with active contrast extravasation involving the splenic flexure of the colon consistent with active GI bleed > interventional radiology consulted.  Unfortunately patient remains at [facility] and was unable to undergo IR evaluation.  Patient arrived to [phone] at 1913 12/19 ongoing GI bleed overnight with hypotension and tachycardia, IR aware and pending evaluation this a.m.  Interim History / Subjective:  Seen lying in bed pale but alert and interactive.  Reports dizziness, lightheadedness, and nausea  Objective    Blood pressure (!) 82/69, pulse (!) 109, temperature 98.3 F (36.8 C), temperature source Oral, resp. rate 19, height 5' 5 (1.651 m), weight 88.5 kg, SpO2 100%.        Intake/Output Summary (Last 24 hours) at 05/08/2024 0732 Last data filed at 05/08/2024 9375 Gross per 24 hour   Intake 4948.38 ml  Output 350 ml  Net 4598.38 ml   Filed Weights   05/06/24 1132  Weight: 88.5 kg    Examination: General: Acute on chronic ill-appearing middle-aged female lying in bed in no acute distress HEENT: [place]/AT, MM pink/moist, PERRL,  Neuro: Alert and oriented x 3, nonfocal CV: Mild tachycardia s1s2 regular rate and rhythm, no murmur, rubs, or gallops,  PULM: Clear to auscultation bilaterally, no increased work of breathing, no added breath sounds GI: soft, bowel sounds active in all 4 quadrants, non-tender, non-distended Extremities: warm/dry, no edema  Skin: no rashes or lesions  Resolved problem list  Acute kidney injury  Assessment and Plan  Active GI bleed with signs of evolving shock - Patient presented with 2-day history of hematochezia with ongoing active bleed.  CTA abdomen while at [facility] revealed active contrast extravasation indicating ongoing bleed.  AM 12/19 patient is again hypotensive and tachycardic P: Transfer to ICU for close monitoring Interventional radiology aware of patient's transfer to [facility] pending formal eval this a.m. Aggressive resuscitation to hemodynamic stability prioritize blood over IV resuscitation Can consider vasopressor support as needed Trend CBC  GI following, appreciate assistance NPO  Maintain 2 large bore PIVs  Acute blood loss anemia superimposed on anemia of chronic disease P: Transfuse per protocol Hemoglobin goal greater than 7 Treat underlying cause of bleeding as above  Type 2 diabetes P: SSI CBG goal 140-180 CBG checks every 4  Mild hyperkalemia P: Trend Consider Lokelma if potassium remains elevated on a.m. chemistry, currently pending  Leukocytosis P: Trend CBC and fever curve  Labs   CBC: Recent Labs  Lab 05/06/24 1147 05/07/24 0429 05/07/24 1426 05/07/24 1636  WBC 14.4* 12.4*  --   --   NEUTROABS 9.5*  --   --   --   HGB 7.8* 5.7* 7.3* 6.2*  HCT 25.2* 18.4* 22.0* 18.6*  MCV  90.0 84.4  --   --   PLT 300 211  --   --     Basic Metabolic Panel: Recent Labs  Lab 05/06/24 1147 05/07/24 0429 05/07/24 1426  NA 140 138 140  K 5.2* 6.1* 5.2*  CL 104 107 110  CO2 20* 26 20*  GLUCOSE 98 153* 167*  BUN 33* 43* 38*  CREATININE 1.30* 1.13* 0.97  CALCIUM  9.6 7.9* 7.6*   GFR: Estimated Creatinine Clearance: 64.4 mL/min (by C-G formula based on SCr of 0.97 mg/dL). Recent Labs  Lab 05/06/24 1147 05/07/24 0429  WBC 14.4* 12.4*    Liver Function Tests: Recent Labs  Lab 05/06/24 1147  AST 24  ALT 13  ALKPHOS 81  BILITOT 0.3  PROT 7.2  ALBUMIN 3.9   No results for input(s): LIPASE, AMYLASE in the last 168 hours. No results for input(s): AMMONIA in the last 168 hours.  ABG No results found for: PHART, PCO2ART, PO2ART, HCO3, TCO2, ACIDBASEDEF, O2SAT   Coagulation Profile: No results for input(s): INR, PROTIME in the last 168 hours.  Cardiac Enzymes: No results for input(s): CKTOTAL, CKMB, CKMBINDEX, TROPONINI in the last 168 hours.  HbA1C: Hemoglobin A1C  Date/Time Value Ref Range Status  01/19/2021 12:00 AM 7.2  Final  10/19/2020 12:00 AM 7.9  Final   HbA1c, POC (controlled diabetic range)  Date/Time Value Ref Range Status  10/16/2022 03:45 PM 8.6 (A) 0.0 - 7.0 % Final  07/11/2022 03:38 PM 10.9 (A) 0.0 - 7.0 % Final   Hgb A1c MFr Bld  Date/Time Value Ref Range Status  04/04/2024 08:38 AM 6.8 (H) 4.8 - 5.6 % Final    Comment:    (NOTE) Diagnosis of Diabetes The following HbA1c ranges recommended by the American Diabetes Association (ADA) may be used as an aid in the diagnosis of diabetes mellitus.  Hemoglobin             Suggested A1C NGSP%              Diagnosis  <5.7                   Non Diabetic  5.7-6.4                Pre-Diabetic  >6.4                   Diabetic  <7.0                   Glycemic control for                       adults with diabetes.      CBG: Recent Labs  Lab  05/07/24 0743 05/07/24 1106 05/07/24 1624 05/07/24 2132 05/08/24 0201  GLUCAP 207* 192* 174* 174* 210*    Review of Systems:   Please see the history of present illness. All other systems reviewed and are negative   Past Medical History:  She,  has a past medical history of Anxiety and depression, Chronic constipation, Diabetes mellitus, type II (HCC), Frequent headaches (chronic), GERD (gastroesophageal reflux disease), History of UTI, Hyperlipidemia, Hypertension, Leukocytosis (12/2015), Morbid obesity (HCC),  and OSA on CPAP.   Surgical History:   Past Surgical History:  Procedure Laterality Date   ABDOMINAL HYSTERECTOMY  1998   Dysfunctional uterine bleeding.  No hx of abnormal paps.   BIOPSY  10/20/2018   Procedure: BIOPSY;  Surgeon: Harvey Margo CROME, MD;  [facility];  Service: Endoscopy;;  stomach   CATARACT EXTRACTION W/PHACO Left 08/24/2022   Procedure: CATARACT EXTRACTION PHACO AND INTRAOCULAR LENS PLACEMENT (IOC);  Surgeon: Harrie Agent, MD;  Location: [facility];  Service: Ophthalmology;  Laterality: Left;  CDE 8.53   CATARACT EXTRACTION W/PHACO Right 09/07/2022   Procedure: CATARACT EXTRACTION PHACO AND INTRAOCULAR LENS PLACEMENT (IOC);  Surgeon: Harrie Agent, MD;  Location: [facility];  Service: Ophthalmology;  Laterality: Right;  CDE: 10.02   COLONOSCOPY  2012   Dr. Gaylia: 1.5 cm pendunculated descending colon polyp (tubulovillous adenoma) and diminutive hyperplastic polyps   COLONOSCOPY N/A 10/20/2018   Procedure: COLONOSCOPY;  Surgeon: Harvey Margo CROME, MD; 4 tubular adenomas, diverticulosis in the sigmoid colon, tortuous left colon, external and internal hemorrhoids. Due for repeat in 2023.    COLONOSCOPY N/A 04/07/2024   Procedure: COLONOSCOPY;  Surgeon: Eartha Angelia Sieving, MD;  [facility]: [facility];  Service: Gastroenterology;  Laterality: N/A;   COLONOSCOPY WITH PROPOFOL  N/A 12/24/2022   Procedure: COLONOSCOPY WITH PROPOFOL ;  Surgeon: Cindie Carlin POUR,  DO;  [facility]: [facility];  Service: Endoscopy;  Laterality: N/A;  11:15 am, asa 3   ENTEROSCOPY N/A 04/11/2024   Procedure: ENTEROSCOPY;  Surgeon: Cindie Carlin POUR, DO;  [facility];  Service: Endoscopy;  Laterality: N/A;   ESOPHAGOGASTRODUODENOSCOPY N/A 10/20/2018   Procedure: ESOPHAGOGASTRODUODENOSCOPY (EGD);  Surgeon: Harvey Margo CROME, MD;  gastritis and duodenitis. No H. Pylori.    ESOPHAGOGASTRODUODENOSCOPY N/A 04/07/2024   Procedure: EGD (ESOPHAGOGASTRODUODENOSCOPY);  Surgeon: Eartha Angelia, Sieving, MD;  [facility];  Service: Gastroenterology;  Laterality: N/A;  WITH ENTEROSCOPY   ESOPHAGOGASTRODUODENOSCOPY N/A 04/05/2024   Procedure: EGD (ESOPHAGOGASTRODUODENOSCOPY);  Surgeon: Cinderella Deatrice FALCON, MD;  [facility];  Service: Endoscopy;  Laterality: N/A;   GIVENS CAPSULE STUDY N/A 04/09/2024   Procedure: IMAGING PROCEDURE, GI TRACT, INTRALUMINAL, VIA CAPSULE;  Surgeon: Cinderella Deatrice FALCON, MD;  [facility];  Service: Endoscopy;  Laterality: N/A;   POLYPECTOMY  10/20/2018   Procedure: POLYPECTOMY;  Surgeon: Harvey Margo CROME, MD;  [facility];  Service: Endoscopy;;   POLYPECTOMY  12/24/2022   Procedure: POLYPECTOMY INTESTINAL;  Surgeon: Cindie Carlin POUR, DO;  [facility];  Service: Endoscopy;;   wart removal  08/04/2019   benign     Social History:   reports that she has been smoking cigarettes. She has a 5 pack-year smoking history. She has never used smokeless tobacco. She reports that she does not drink alcohol and does not use drugs.   Family History:  Her family history includes COPD in her father; Cancer in her mother; Diabetes in her father; Heart attack in her father; Heart failure in her father; Hyperlipidemia in her father. There is no history of Colon cancer.   Allergies Allergies[1]   Home Medications  Prior to Admission medications  Medication Sig Start Date End Date Taking? Authorizing Provider   atorvastatin  (LIPITOR) 20 MG tablet TAKE 1 Tablet BY MOUTH ONCE EVERY DAY Patient taking differently: Take 20 mg by mouth daily. TAKE 1 Tablet BY MOUTH ONCE EVERY DAY 10/05/21  Yes Comer Kirsch, PA-C  cloNIDine  (CATAPRES ) 0.1 MG tablet Take 1 tablet (  0.1 mg total) by mouth 2 (two) times daily. Patient taking differently: Take 0.1 mg by mouth daily. 10/05/21  Yes Comer Kirsch, PA-C  Dulaglutide (TRULICITY) 0.75 MG/0.5ML SOPN Inject 0.75 mg into the skin every Monday.   Yes [provider]  FEROSUL 325 (65 Fe) MG tablet Take 325 mg by mouth daily. 04/24/24  Yes [provider]  linaclotide  (LINZESS ) 72 MCG capsule Take 72 mcg by mouth daily as needed (constipation).   Yes [provider]  losartan -hydrochlorothiazide  (HYZAAR) 100-12.5 MG tablet Take 1 tablet by mouth daily. 11/09/22  Yes [provider]  metFORMIN  (GLUCOPHAGE ) 1000 MG tablet Take 1 tablet (1,000 mg total) by mouth 2 (two) times daily with a meal. 10/05/21  Yes Comer Kirsch, PA-C  pantoprazole  (PROTONIX ) 40 MG tablet Take 1 tablet (40 mg total) by mouth 2 (two) times daily before a meal. 04/12/24  Yes Sigdel, Santosh, MD  glipiZIDE  (GLUCOTROL  XL) 5 MG 24 hr tablet Take 1 tablet (5 mg total) by mouth daily with breakfast. 10/18/22   Nida, Ethelle ORN, MD     Critical care time:   CRITICAL CARE Performed by: Gracelyn Coventry D. Harris   Total critical care time: 42 minutes  Critical care time was exclusive of separately billable procedures and treating other patients.  Critical care was necessary to treat or prevent imminent or life-threatening deterioration.  Critical care was time spent personally by me on the following activities: development of treatment plan with patient and/or surrogate as well as nursing, discussions with consultants, evaluation of patient's response to treatment, examination of patient, obtaining history from patient or surrogate, ordering and performing treatments  and interventions, ordering and review of laboratory studies, ordering and review of radiographic studies, pulse oximetry and re-evaluation of patient's condition.  Luisalberto Beegle D. Harris, NP-C [facility] Personal contact information can be found on Amion  If no contact or response made please call [phone] 05/08/2024, 7:51 AM             [1]  Allergies Allergen Reactions   Januvia  [Sitagliptin ] Other (See Comments)    Caused pancreatitis   Amlodipine  Swelling   "

## 2024-05-08 NOTE — Sedation Documentation (Signed)
 Anesthesia MD and CRNA at bedside. Pt to be done under MAC

## 2024-05-08 NOTE — Procedures (Signed)
 Interventional Radiology Procedure Note  Procedure: successful  peripheral microcoil embo of right colic branches at the site of rt colon bleeding   Complications: None  Estimated Blood Loss:  min  Findings: Right colon angiodysplasia with active bleeding.      EMERSON FREDERIC SPECKING, MD

## 2024-05-08 NOTE — Progress Notes (Addendum)
 Dr. Marcene aware of patient's low blood pressure and new orders have been placed and started . Pt is on RED MEWS due to decreased blood pressure and tachycardia. T: 98.6, HR: 105, RR: 18, BP: 68/50, O2 sat: 100% RA. Charge RN made aware.

## 2024-05-08 NOTE — Transfer of Care (Signed)
 Immediate Anesthesia Transfer of Care Note  Patient: Casey Johnston  Procedure(s) Performed: RADIOLOGY WITH ANESTHESIA  Patient Location: ICU  Anesthesia Type:MAC  Level of Consciousness: drowsy  Airway & Oxygen Therapy: Patient Spontanous Breathing  Post-op Assessment: Report given to RN and Post -op Vital signs reviewed and stable  Post vital signs: Reviewed and stable  Last Vitals:  Vitals Value Taken Time  BP 128/59 05/08/24 11:13  Temp 98   Pulse 90 05/08/24 11:15  Resp 10 05/08/24 11:15  SpO2 100 % 05/08/24 11:15  Vitals shown include unfiled device data.  Last Pain:  Vitals:   05/08/24 0900  TempSrc:   PainSc: 0-No pain      Patients Stated Pain Goal: 0 (05/07/24 2100)  Complications: No notable events documented.

## 2024-05-08 NOTE — Plan of Care (Signed)

## 2024-05-08 NOTE — Progress Notes (Signed)
LR bolus infusing.

## 2024-05-08 NOTE — Progress Notes (Signed)
 Dr. Marcene notified at [phone] in regards to low BP. New orders received.

## 2024-05-09 ENCOUNTER — Inpatient Hospital Stay (HOSPITAL_COMMUNITY)

## 2024-05-09 DIAGNOSIS — K5521 Angiodysplasia of colon with hemorrhage: Principal | ICD-10-CM

## 2024-05-09 LAB — CBC
HCT: 18.5 % — ABNORMAL LOW (ref 36.0–46.0)
HCT: 25.5 % — ABNORMAL LOW (ref 36.0–46.0)
Hemoglobin: 6.4 g/dL — CL (ref 12.0–15.0)
Hemoglobin: 8.8 g/dL — ABNORMAL LOW (ref 12.0–15.0)
MCH: 30.8 pg (ref 26.0–34.0)
MCH: 30 pg (ref 26.0–34.0)
MCHC: 34.6 g/dL (ref 30.0–36.0)
MCHC: 34.5 g/dL (ref 30.0–36.0)
MCV: 88.9 fL (ref 80.0–100.0)
MCV: 87 fL (ref 80.0–100.0)
Platelets: 112 K/uL — ABNORMAL LOW (ref 150–400)
Platelets: 103 K/uL — ABNORMAL LOW (ref 150–400)
RBC: 2.08 MIL/uL — ABNORMAL LOW (ref 3.87–5.11)
RBC: 2.93 MIL/uL — ABNORMAL LOW (ref 3.87–5.11)
RDW: 15.9 % — ABNORMAL HIGH (ref 11.5–15.5)
RDW: 15.2 % (ref 11.5–15.5)
WBC: 23.9 K/uL — ABNORMAL HIGH (ref 4.0–10.5)
WBC: 31.9 K/uL — ABNORMAL HIGH (ref 4.0–10.5)
nRBC: 0.5 % — ABNORMAL HIGH (ref 0.0–0.2)
nRBC: 0.7 % — ABNORMAL HIGH (ref 0.0–0.2)

## 2024-05-09 LAB — BASIC METABOLIC PANEL WITH GFR
Anion gap: 8 (ref 5–15)
BUN: 44 mg/dL — ABNORMAL HIGH (ref 8–23)
CO2: 19 mmol/L — ABNORMAL LOW (ref 22–32)
Calcium: 7.1 mg/dL — ABNORMAL LOW (ref 8.9–10.3)
Chloride: 104 mmol/L (ref 98–111)
Creatinine, Ser: 1.35 mg/dL — ABNORMAL HIGH (ref 0.44–1.00)
GFR, Estimated: 44 mL/min — ABNORMAL LOW
Glucose, Bld: 265 mg/dL — ABNORMAL HIGH (ref 70–99)
Potassium: 5.2 mmol/L — ABNORMAL HIGH (ref 3.5–5.1)
Sodium: 132 mmol/L — ABNORMAL LOW (ref 135–145)

## 2024-05-09 LAB — HEMOGLOBIN AND HEMATOCRIT, BLOOD
HCT: 27.3 % — ABNORMAL LOW (ref 36.0–46.0)
HCT: 20.8 % — ABNORMAL LOW (ref 36.0–46.0)
Hemoglobin: 9.3 g/dL — ABNORMAL LOW (ref 12.0–15.0)
Hemoglobin: 7.2 g/dL — ABNORMAL LOW (ref 12.0–15.0)

## 2024-05-09 LAB — PREPARE RBC (CROSSMATCH)

## 2024-05-09 LAB — GLUCOSE, CAPILLARY
Glucose-Capillary: 283 mg/dL — ABNORMAL HIGH (ref 70–99)
Glucose-Capillary: 225 mg/dL — ABNORMAL HIGH (ref 70–99)
Glucose-Capillary: 181 mg/dL — ABNORMAL HIGH (ref 70–99)
Glucose-Capillary: 131 mg/dL — ABNORMAL HIGH (ref 70–99)

## 2024-05-09 LAB — LACTIC ACID, PLASMA
Lactic Acid, Venous: 2.3 mmol/L (ref 0.5–1.9)
Lactic Acid, Venous: 2.1 mmol/L (ref 0.5–1.9)
Lactic Acid, Venous: 1.9 mmol/L (ref 0.5–1.9)

## 2024-05-09 MED ORDER — SODIUM CHLORIDE 0.9% IV SOLUTION: Freq: Once | INTRAVENOUS | Status: AC

## 2024-05-09 MED ORDER — IOHEXOL 350 MG/ML SOLN
75.0000 mL | Freq: Once | INTRAVENOUS | Status: AC | PRN
  Administered 2024-05-09: 75 mL via INTRAVENOUS

## 2024-05-09 MED ORDER — LACTATED RINGERS IV BOLUS
500.0000 mL | Freq: Once | INTRAVENOUS | Status: AC
  Administered 2024-05-09: 500 mL via INTRAVENOUS

## 2024-05-09 NOTE — Progress Notes (Signed)
 Patient ID: Casey Johnston, female   DOB: October 26, 1960, 63 y.o.   MRN: [id number] ABD remains nontender. Lactate down to 2.1 Hb 9.3  Dann Hummer, MD, MPH, FACS Please use [email or web address] to contact on call provider

## 2024-05-09 NOTE — Plan of Care (Signed)
 Ongoing bleeding with bright red blood per rectum this morning, hemoglobin down to 6.4, hypotensive with SBP in 80s.  Discussed with Dr. Theophilus, CCM will continue to follow. TRH will assume care once patient stable.    Nydia Distance M.D.  [facility] 05/09/2024, 10:25 AM

## 2024-05-09 NOTE — Consult Note (Signed)
 Reason for Consult:LGIB, S/P angioembolization Referring Physician: Lonna Coder  Casey Johnston is an 63 y.o. female.  HPI: 63yo F with past medical history of type 2 diabetes, hypertension, GERD and anxiety who was admitted with a lower GI bleed.  She presented to [facility] emergency department with hematochezia and shortness of breath.  CT angio showed active contrast extravasation involving the splenic flexure of the colon.  Interventional radiology was consulted and patient was transferred to [facility].  She underwent angioembolization of the right colic branches at the site of right colon bleeding by Dr. Majel on 12/19.  She has had some further hematochezia and her hemoglobin dropped.  She is receiving blood in the ICU at this time.  CT angio was repeated today and does not show any active bleeding in her colon but there is some colon thickening questioning some degree of ischemia.  I was asked to see her in consultation.  Past Medical History:  Diagnosis Date   Anxiety and depression    Chronic constipation    Diabetes mellitus, type II (HCC)    Dr. Lenis managing   Frequent headaches chronic   ibuprofen helps some   GERD (gastroesophageal reflux disease)    History of UTI    Recurrent per pt (approx 3 per year)   Hyperlipidemia    Hypertension    Leukocytosis 12/2015   mild lymphocytosis.  Path smear review reassuring.  Repeat 01/2016 stable.   Morbid obesity (HCC)    OSA on CPAP    not using CPAP anymore    Past Surgical History:  Procedure Laterality Date   ABDOMINAL HYSTERECTOMY  1998   Dysfunctional uterine bleeding.  No hx of abnormal paps.   BIOPSY  10/20/2018   Procedure: BIOPSY;  Surgeon: Harvey Margo CROME, MD;  [facility]: [facility];  Service: Endoscopy;;  stomach   CATARACT EXTRACTION W/PHACO Left 08/24/2022   Procedure: CATARACT EXTRACTION PHACO AND INTRAOCULAR LENS PLACEMENT (IOC);  Surgeon: Harrie Agent, MD;  Location: [facility] ORS;  Service: Ophthalmology;   Laterality: Left;  CDE 8.53   CATARACT EXTRACTION W/PHACO Right 09/07/2022   Procedure: CATARACT EXTRACTION PHACO AND INTRAOCULAR LENS PLACEMENT (IOC);  Surgeon: Harrie Agent, MD;  Location: [facility];  Service: Ophthalmology;  Laterality: Right;  CDE: 10.02   COLONOSCOPY  2012   Dr. Gaylia: 1.5 cm pendunculated descending colon polyp (tubulovillous adenoma) and diminutive hyperplastic polyps   COLONOSCOPY N/A 10/20/2018   Procedure: COLONOSCOPY;  Surgeon: Harvey Margo CROME, MD; 4 tubular adenomas, diverticulosis in the sigmoid colon, tortuous left colon, external and internal hemorrhoids. Due for repeat in 2023.    COLONOSCOPY N/A 04/07/2024   Procedure: COLONOSCOPY;  Surgeon: Eartha Angelia Sieving, MD;  [facility];  Service: Gastroenterology;  Laterality: N/A;   COLONOSCOPY WITH PROPOFOL  N/A 12/24/2022   Procedure: COLONOSCOPY WITH PROPOFOL ;  Surgeon: Cindie Carlin POUR, DO;  [facility];  Service: Endoscopy;  Laterality: N/A;  11:15 am, asa 3   ENTEROSCOPY N/A 04/11/2024   Procedure: ENTEROSCOPY;  Surgeon: Cindie Carlin POUR, DO;  [facility];  Service: Endoscopy;  Laterality: N/A;   ESOPHAGOGASTRODUODENOSCOPY N/A 10/20/2018   Procedure: ESOPHAGOGASTRODUODENOSCOPY (EGD);  Surgeon: Harvey Margo CROME, MD;  gastritis and duodenitis. No H. Pylori.    ESOPHAGOGASTRODUODENOSCOPY N/A 04/07/2024   Procedure: EGD (ESOPHAGOGASTRODUODENOSCOPY);  Surgeon: Eartha Angelia, Sieving, MD;  [facility];  Service: Gastroenterology;  Laterality: N/A;  WITH ENTEROSCOPY   ESOPHAGOGASTRODUODENOSCOPY N/A 04/05/2024   Procedure: EGD (ESOPHAGOGASTRODUODENOSCOPY);  Surgeon: Cinderella Deatrice FALCON, MD;  [facility];  Service: Endoscopy;  Laterality: N/A;   GIVENS CAPSULE STUDY N/A 04/09/2024   Procedure: IMAGING PROCEDURE, GI TRACT, INTRALUMINAL, VIA CAPSULE;  Surgeon: Cinderella Deatrice FALCON, MD;  [facility];  Service: Endoscopy;  Laterality: N/A;   IR ANGIOGRAM  SELECTIVE EACH ADDITIONAL VESSEL  05/08/2024   IR ANGIOGRAM SELECTIVE EACH ADDITIONAL VESSEL  05/08/2024   IR ANGIOGRAM SELECTIVE EACH ADDITIONAL VESSEL  05/08/2024   IR ANGIOGRAM VISCERAL SELECTIVE  05/08/2024   IR EMBO ART  VEN HEMORR LYMPH EXTRAV  INC GUIDE ROADMAPPING  05/08/2024   IR US  GUIDE VASC ACCESS RIGHT  05/08/2024   POLYPECTOMY  10/20/2018   Procedure: POLYPECTOMY;  Surgeon: Harvey Margo CROME, MD;  [facility];  Service: Endoscopy;;   POLYPECTOMY  12/24/2022   Procedure: POLYPECTOMY INTESTINAL;  Surgeon: Cindie Carlin POUR, DO;  [facility];  Service: Endoscopy;;   wart removal  08/04/2019   benign    Family History  Problem Relation Age of Onset   Cancer Mother        ovarian   COPD Father    Diabetes Father    Hyperlipidemia Father    Heart attack Father    Heart failure Father    Colon cancer Neg Hx     Social History:  reports that she has been smoking cigarettes. She has a 5 pack-year smoking history. She has never used smokeless tobacco. She reports that she does not drink alcohol and does not use drugs.  Allergies: Allergies[1]  Medications: I have reviewed the patient's current medications.  Results for orders placed or performed during the hospital encounter of 05/06/24 (from the past 48 hours)  Glucose, capillary     Status: Abnormal   Collection Time: 05/07/24 11:06 AM  Result Value Ref Range   Glucose-Capillary 192 (H) 70 - 99 mg/dL    Comment: Glucose reference range applies only to samples taken after fasting for at least 8 hours.  Hemoglobin and hematocrit, blood     Status: Abnormal   Collection Time: 05/07/24  2:26 PM  Result Value Ref Range   Hemoglobin 7.3 (L) 12.0 - 15.0 g/dL    Comment: REPEATED TO VERIFY POST TRANSFUSION SPECIMEN DELTA CHECK NOTED    HCT 22.0 (L) 36.0 - 46.0 %    Comment: Performed at [facility], [address] [place], [place] [address]  Basic metabolic panel with GFR     Status: Abnormal    Collection Time: 05/07/24  2:26 PM  Result Value Ref Range   Sodium 140 135 - 145 mmol/L   Potassium 5.2 (H) 3.5 - 5.1 mmol/L   Chloride 110 98 - 111 mmol/L   CO2 20 (L) 22 - 32 mmol/L   Glucose, Bld 167 (H) 70 - 99 mg/dL    Comment: Glucose reference range applies only to samples taken after fasting for at least 8 hours.   BUN 38 (H) 8 - 23 mg/dL   Creatinine, Ser 9.02 0.44 - 1.00 mg/dL   Calcium  7.6 (L) 8.9 - 10.3 mg/dL   GFR, Estimated >39 >39 mL/min    Comment: (NOTE) Calculated using the CKD-EPI Creatinine Equation (2021)    Anion gap 10 5 - 15    Comment: Performed at [facility], [address] [place], [place] [address]  Glucose, capillary     Status: Abnormal   Collection Time: 05/07/24  4:24 PM  Result Value Ref Range  Glucose-Capillary 174 (H) 70 - 99 mg/dL    Comment: Glucose reference range applies only to samples taken after fasting for at least 8 hours.   Comment 1 Notify RN    Comment 2 Document in Chart   Hemoglobin and hematocrit, blood     Status: Abnormal   Collection Time: 05/07/24  4:36 PM  Result Value Ref Range   Hemoglobin 6.2 (LL) 12.0 - 15.0 g/dL    Comment: REPEATED TO VERIFY POST TRANSFUSION SPECIMEN This critical result has been called to JULIE Audwin Semper by Mitzie Buck on 05/07/2024 17:21:09, and has been read back.    HCT 18.6 (L) 36.0 - 46.0 %    Comment: Performed at [facility], [address], [place], [place] [address]  Prepare RBC (crossmatch)     Status: None   Collection Time: 05/07/24  5:24 PM  Result Value Ref Range   Order Confirmation      ORDER PROCESSED BY BLOOD BANK Performed at [facility], [address] [place], [place] [address]   Glucose, capillary     Status: Abnormal   Collection Time: 05/07/24  9:32 PM  Result Value Ref Range   Glucose-Capillary 174 (H) 70 - 99 mg/dL    Comment: Glucose reference range applies only to samples taken after fasting for at least 8 hours.  MRSA Next Gen by PCR, Nasal     Status: None    Collection Time: 05/07/24  9:33 PM   Specimen: Nasal Mucosa; Nasal Swab  Result Value Ref Range   MRSA by PCR Next Gen NOT DETECTED NOT DETECTED    Comment: (NOTE) The GeneXpert MRSA Assay (FDA approved for NASAL specimens only), is one component of a comprehensive MRSA colonization surveillance program. It is not intended to diagnose MRSA infection nor to guide or monitor treatment for MRSA infections. Test performance is not FDA approved in patients less than 85 years old. Performed at [facility], [address], [place], [place] [address]   Type and screen [facility]     Status: None (Preliminary result)   Collection Time: 05/07/24 11:10 PM  Result Value Ref Range   ABO/RH(D) O POS    Antibody Screen NEG    Sample Expiration 05/10/2024,2359    Unit Number [id number]    Blood Component Type RED CELLS,LR    Unit division 00    Status of Unit ISSUED,FINAL    Transfusion Status OK TO TRANSFUSE    Crossmatch Result Compatible    Unit Number [id number]    Blood Component Type RED CELLS,LR    Unit division 00    Status of Unit ISSUED,FINAL    Transfusion Status OK TO TRANSFUSE    Crossmatch Result      Compatible Performed at [facility], [address], [place], [place] [address]    Unit Number [id number]    Blood Component Type RED CELLS,LR    Unit division 00    Status of Unit ISSUED,FINAL    Transfusion Status OK TO TRANSFUSE    Crossmatch Result Compatible    Unit Number [id number]    Blood Component Type RED CELLS,LR    Unit division 00    Status of Unit ISSUED    Transfusion Status OK TO TRANSFUSE    Crossmatch Result Compatible    Unit Number [id number]    Blood Component Type RED CELLS,LR    Unit division 00    Status of Unit ISSUED    Transfusion  Status OK TO TRANSFUSE    Crossmatch Result Compatible   Hemoglobin and hematocrit, blood     Status: Abnormal   Collection Time: 05/08/24 12:00 AM  Result Value  Ref Range   Hemoglobin 7.3 (L) 12.0 - 15.0 g/dL   HCT 78.6 (L) 63.9 - 53.9 %    Comment: Performed at [facility], [address] [place], [place] [address]  Glucose, capillary     Status: Abnormal   Collection Time: 05/08/24  2:01 AM  Result Value Ref Range   Glucose-Capillary 210 (H) 70 - 99 mg/dL    Comment: Glucose reference range applies only to samples taken after fasting for at least 8 hours.  Prepare RBC (crossmatch)     Status: None   Collection Time: 05/08/24  2:19 AM  Result Value Ref Range   Order Confirmation      ORDER PROCESSED BY BLOOD BANK Performed at [facility], [address], [place], [place] [address]   Basic metabolic panel with GFR     Status: Abnormal   Collection Time: 05/08/24  7:15 AM  Result Value Ref Range   Sodium 137 135 - 145 mmol/L   Potassium 4.9 3.5 - 5.1 mmol/L   Chloride 110 98 - 111 mmol/L   CO2 18 (L) 22 - 32 mmol/L   Glucose, Bld 279 (H) 70 - 99 mg/dL    Comment: Glucose reference range applies only to samples taken after fasting for at least 8 hours.   BUN 42 (H) 8 - 23 mg/dL   Creatinine, Ser 8.84 (H) 0.44 - 1.00 mg/dL   Calcium  6.9 (L) 8.9 - 10.3 mg/dL   GFR, Estimated 53 (L) >60 mL/min    Comment: (NOTE) Calculated using the CKD-EPI Creatinine Equation (2021)    Anion gap 9 5 - 15    Comment: Performed at [facility], [address], [place], [place] [address]  CBC     Status: Abnormal   Collection Time: 05/08/24  7:15 AM  Result Value Ref Range   WBC 19.8 (H) 4.0 - 10.5 K/uL   RBC 2.18 (L) 3.87 - 5.11 MIL/uL   Hemoglobin 6.7 (LL) 12.0 - 15.0 g/dL    Comment: REPEATED TO VERIFY This critical result has been called to M. STEVENS, RN by Delon Grippe on 05/08/2024 07:59:01, and has been read back.    HCT 19.6 (L) 36.0 - 46.0 %   MCV 89.9 80.0 - 100.0 fL   MCH 30.7 26.0 - 34.0 pg   MCHC 34.2 30.0 - 36.0 g/dL   RDW 84.6 88.4 - 84.4 %   Platelets 100 (L) 150 - 400 K/uL   nRBC 0.2 0.0 - 0.2 %    Comment:  Performed at [facility], [address] [place], [place] [address]  Prepare RBC (crossmatch)     Status: None   Collection Time: 05/08/24  7:42 AM  Result Value Ref Range   Order Confirmation      ORDER PROCESSED BY BLOOD BANK Performed at [facility], [address], [place], [place] [address]   CBC     Status: Abnormal   Collection Time: 05/08/24  2:09 PM  Result Value Ref Range   WBC 24.9 (H) 4.0 - 10.5 K/uL   RBC 3.45 (L) 3.87 - 5.11 MIL/uL   Hemoglobin 10.7 (L) 12.0 - 15.0 g/dL    Comment: REPEATED TO VERIFY   HCT 33.4 (L) 36.0 - 46.0 %  MCV 96.8 80.0 - 100.0 fL    Comment: DELTA CHECK NOTED   MCH 31.0 26.0 - 34.0 pg   MCHC 32.0 30.0 - 36.0 g/dL   RDW 84.3 (H) 88.4 - 84.4 %   Platelets 92 (L) 150 - 400 K/uL    Comment: REPEATED TO VERIFY SPECIMEN CHECKED FOR CLOTS Immature Platelet Fraction may be clinically indicated, consider ordering this additional test [id number]    nRBC 0.2 0.0 - 0.2 %    Comment: Performed at [facility], [address], [place], [place] [address]  Glucose, capillary     Status: Abnormal   Collection Time: 05/08/24  5:11 PM  Result Value Ref Range   Glucose-Capillary 249 (H) 70 - 99 mg/dL    Comment: Glucose reference range applies only to samples taken after fasting for at least 8 hours.  Glucose, capillary     Status: Abnormal   Collection Time: 05/08/24  9:24 PM  Result Value Ref Range   Glucose-Capillary 330 (H) 70 - 99 mg/dL    Comment: Glucose reference range applies only to samples taken after fasting for at least 8 hours.  CBC     Status: Abnormal   Collection Time: 05/09/24  6:51 AM  Result Value Ref Range   WBC 23.9 (H) 4.0 - 10.5 K/uL   RBC 2.08 (L) 3.87 - 5.11 MIL/uL   Hemoglobin 6.4 (LL) 12.0 - 15.0 g/dL    Comment: REPEATED TO VERIFY This critical result has been called to [facility] ELIZONDO RN by Jone Shady on 05/09/2024 07:49:39, and has been read back.    HCT 18.5 (L) 36.0 - 46.0 %   MCV 88.9 80.0  - 100.0 fL    Comment: DELTA CHECK NOTED REPEATED TO VERIFY    MCH 30.8 26.0 - 34.0 pg   MCHC 34.6 30.0 - 36.0 g/dL   RDW 84.0 (H) 88.4 - 84.4 %   Platelets 112 (L) 150 - 400 K/uL   nRBC 0.5 (H) 0.0 - 0.2 %    Comment: Performed at [facility], [address] [place], [place] [address]  Basic metabolic panel with GFR     Status: Abnormal   Collection Time: 05/09/24  6:51 AM  Result Value Ref Range   Sodium 132 (L) 135 - 145 mmol/L   Potassium 5.2 (H) 3.5 - 5.1 mmol/L   Chloride 104 98 - 111 mmol/L   CO2 19 (L) 22 - 32 mmol/L   Glucose, Bld 265 (H) 70 - 99 mg/dL    Comment: Glucose reference range applies only to samples taken after fasting for at least 8 hours.   BUN 44 (H) 8 - 23 mg/dL   Creatinine, Ser 8.64 (H) 0.44 - 1.00 mg/dL   Calcium  7.1 (L) 8.9 - 10.3 mg/dL   GFR, Estimated 44 (L) >60 mL/min    Comment: (NOTE) Calculated using the CKD-EPI Creatinine Equation (2021)    Anion gap 8 5 - 15    Comment: Performed at [facility], [address] [place], [place] [address]  Glucose, capillary     Status: Abnormal   Collection Time: 05/09/24  8:04 AM  Result Value Ref Range   Glucose-Capillary 283 (H) 70 - 99 mg/dL    Comment: Glucose reference range applies only to samples taken after fasting for at least 8 hours.  Prepare RBC (crossmatch)     Status: None   Collection Time: 05/09/24  8:30 AM  Result Value Ref Range  Order Confirmation      ORDER PROCESSED BY BLOOD BANK Performed at [facility], [address], [place], [place] [address]   Lactic acid, plasma     Status: Abnormal   Collection Time: 05/09/24  8:34 AM  Result Value Ref Range   Lactic Acid, Venous 2.3 (HH) 0.5 - 1.9 mmol/L    Comment: Result called to, Read back and verified with CHRISTELLA HOLLINGSWORTH RN @ [phone] BY [facility] Performed at [facility], [address], [place], [place] [address]     CT Angio Abd/Pel w/ and/or w/o Result Date: 05/09/2024 EXAM: CTA ABDOMEN AND PELVIS WITH AND  WITHOUT CONTRAST 05/09/2024 09:40:06 AM TECHNIQUE: CTA images of the abdomen and pelvis with and without intravenous contrast. Three-dimensional MIP/volume rendered formations were performed. Automated exposure control, iterative reconstruction, and/or weight based adjustment of the mA/kV was utilized to reduce the radiation dose to as low as reasonably achievable. 75 mL of iohexol  (OMNIPAQUE ) 350 MG/ML injection was administered. COMPARISON: 05/07/2024. Subsegmental atelectasis identified within both lung bases. CLINICAL HISTORY: Lower GI bleed. FINDINGS: VASCULATURE: AORTA: Mild aortic atherosclerotic calcification. No acute finding. No abdominal aortic aneurysm. No dissection. CELIAC TRUNK: Status post coil embolization branches of the superior mesenteric artery branch supplying the right colon and hepatic flexure region. No acute finding. No occlusion or significant stenosis. SUPERIOR MESENTERIC ARTERY: Status post coil embolization branches of the superior mesenteric artery branch supplying the right colon and hepatic flexure region. No acute finding. No occlusion or significant stenosis. RENAL ARTERIES: No acute finding. No occlusion or significant stenosis. ILIAC ARTERIES: No acute finding. No occlusion or significant stenosis. LIVER: The liver is unremarkable. GALLBLADDER AND BILE DUCTS: Contrast material fills the gallbladder lumen. No biliary ductal dilatation. SPLEEN: The spleen is unremarkable. PANCREAS: The pancreas is unremarkable. ADRENAL GLANDS: Bilateral adrenal glands demonstrate no acute abnormality. KIDNEYS, URETERS AND BLADDER: Bosniak class 2 right kidney cysts are unchanged. The largest arises off the lateral cortex of the right kidney measuring 4.3 cm, image 98/18. No stones in the kidneys or ureters. No hydronephrosis. No perinephric or periureteral stranding. Urinary bladder is unremarkable. GI AND BOWEL: Stomach and duodenal sweep demonstrate no acute abnormality. There is no bowel  obstruction. There is no brisk arterial phase extravasation of contrast into the colonic lumen. There is new low attenuation wall thickening involving the ascending colon with hyperemia of the mucosa on the delayed images with trace amount of intraluminal contrast noted on the delayed images. The trace intraluminal contrast on delayed images could represent very slow or intermittent bleeding. Colonic diverticulosis without signs of acute diverticulitis. There are no signs of active intraluminal contrast extravasation to indicate recurrent GI bleeding. There is no abnormal bowel dilatation. No abnormal bowel wall thickening or distension in the remainder of the visualized bowel. REPRODUCTIVE: Status post hysterectomy. No adnexal mass. PERITONEUM AND RETRPERITONEUM: No ascites or free air. No free fluid or fluid collections identified. LUNG BASE: Subsegmental atelectasis identified within both lung bases. LYMPH NODES: No lymphadenopathy. BONES AND SOFT TISSUES: No acute abnormality of the bones. No acute soft tissue abnormality. IMPRESSION: 1. No evidence of active arterial phase  intraluminal contrast extravasation. 2. New low attenuation wall thickening involving the ascending colon with hyperemia of the mucosa and trace intraluminal contrast on delayed images in the setting of recent coil embolization of branches of the superior mesenteric artery supplying the right colon/hepatic flexure region; differential considerations include ischemic colitis, infectious colitis, or post-embolization changes. 3. Colonic diverticulosis without signs  of acute diverticulitis. Electronically signed by: Waddell Calk MD 05/09/2024 10:07 AM EST RP Workstation: [id number]   IR Angiogram Visceral Selective Result Date: 05/08/2024 INDICATION: Acute right colon lower GI bleeding by CTA, associated blood-loss anemia, requiring volume resuscitation and transfusion EXAM: ULTRASOUND GUIDANCE FOR VASCULAR ACCESS SMA, RIGHT COLIC BRANCH,  MARGINAL BRANCH, AND 3 SEPARATE VASA RECTA BRANCHES CATHETERIZATIONS, ANGIOGRAMS, AND PERIPHERAL MICRO COIL EMBOLIZATION MEDICATIONS: 1% lidocaine  local. ANESTHESIA/SEDATION: MAC provided by the anesthesia service CONTRAST:  130 cc omni 300 FLUOROSCOPY: Radiation Exposure Index (as provided by the fluoroscopic device): 1,509 mGy Kerma COMPLICATIONS: None immediate. PROCEDURE: Informed consent was obtained from the patient following explanation of the procedure, risks, benefits and alternatives. The patient understands, agrees and consents for the procedure. All questions were addressed. A time out was performed prior to the initiation of the procedure. Maximal barrier sterile technique utilized including caps, mask, sterile gowns, sterile gloves, large sterile drape, hand hygiene, and Betadine  prep. Previous imaging reviewed. Under sterile conditions and local anesthesia, ultrasound micropuncture access performed of the patent right common femoral artery. Images obtained for documentation of the patent right common femoral artery. Five French sheath inserted over a Bentson guidewire. C2 catheter advanced initially utilized to select the SMA origin. SMA angiogram performed. SMA: SMA trunk and its branches are patent. Prominent right colic artery noted where there is active contrast extravasation into the hepatic flexure of the colon which correlates with the positive CTA findings. Progreat angled tip microcatheter was advanced through the C2 catheter into the SMA trunk. Fathom guidewire was utilized to select the right colic branch. Selective right colic angiogram performed. Right colic: Right colic branches widely patent as well as the marginal and Vasa recta branches to the abnormal hepatic flexure. In the hepatic flexure, there is abnormal hypervascularity, tortuosity, and slow arterial oozing in the area of active bleeding. There are prominent early draining veins. 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 Microcatheter advanced into a supplying Vasa recta branch. Selective Vasa recta angiography performed. Right Vasa recta branch (1): Tortuous abnormal hypervascularity and blushing noted with early draining vein compatible with a supplying branch to the area of angio dysplasia. Catheter access point is safe for peripheral micro coil embolization. There was successful deployment of terumo 2 mm microcoils within the branch ranging in size from 4 cm to 2 cm. After deployment of the microcoils, this peripheral Vasa recta branch is occluded. Repeat injection through the microcatheter demonstrates additional peripheral branch supply to the abnormal vascularity. Microcatheter was retracted and utilized to select a second right vasa recta branch more superiorly. Right Vasa recta branch (2): This branch also supplies the tortuous abnormal hypervascular area with blushing and early draining veins. Catheter access location is safe for micro coil embolization. Additional 2 mm microcoils were successfully deployed in this second branch. Following micro coil embolization, repeat angiography confirms significant reduction in the Vasa recta supply to the angio dysplasia. Repeat injection of the marginal artery demonstrates a third superior branch with abnormal vascular supply. This branch was also successfully accessed with the microcatheter peripherally. Right Vasa recta branch (3): This branch also supplies the abnormal vascularity with early draining veins. Peripheral access is also safe for micro coil embolization. This branch was occluded with 2 mm and 3 mm microcoils successfully. Final post embolization angiogram through the marginal artery demonstrates significant reduction in the arterial supply to the area of presumed angio dysplasia. There is overall preserved marginal artery supply to the right colon. No additional large Vasa recta  branch visualized supplying the abnormal vascularity. At this point the  procedure was terminated. Microcatheter and C2 catheter both removed. Hemostasis obtained at the right CFA access site with the CELT device successfully. Patient tolerated the procedure well. There were no immediate complications. There was improvement in the patient's hemodynamics following the embolization. 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 This area correlated with the CTA. Electronically Signed   By: CHRISTELLA.  Shick M.D.   On: 05/08/2024 13:57   IR US  Guide Vasc Access Right Result Date: 05/08/2024 INDICATION: Acute right colon lower GI bleeding by CTA, associated blood-loss anemia, requiring volume resuscitation and transfusion EXAM: ULTRASOUND GUIDANCE FOR VASCULAR ACCESS SMA, RIGHT COLIC BRANCH, MARGINAL BRANCH, AND 3 SEPARATE VASA RECTA BRANCHES CATHETERIZATIONS, ANGIOGRAMS, AND PERIPHERAL MICRO COIL EMBOLIZATION MEDICATIONS: 1% lidocaine  local. ANESTHESIA/SEDATION: MAC provided by the anesthesia service CONTRAST:  130 cc omni 300 FLUOROSCOPY: Radiation Exposure Index (as provided by the fluoroscopic device): 1,509 mGy Kerma COMPLICATIONS: None immediate. PROCEDURE: Informed consent was obtained from the patient following explanation of the procedure, risks, benefits and alternatives. The patient understands, agrees and consents for the procedure. All questions were addressed. A time out was performed prior to the initiation of the procedure. Maximal barrier sterile technique utilized including caps, mask, sterile gowns, sterile gloves, large sterile drape, hand hygiene, and Betadine  prep. Previous imaging reviewed. Under sterile conditions and local anesthesia, ultrasound micropuncture access performed of the patent right common femoral artery. Images obtained for documentation of the patent right common femoral artery. Five French sheath inserted  over a Bentson guidewire. C2 catheter advanced initially utilized to select the SMA origin. SMA angiogram performed. SMA: SMA trunk and its branches are patent. Prominent right colic artery noted where there is active contrast extravasation into the hepatic flexure of the colon which correlates with the positive CTA findings. Progreat angled tip microcatheter was advanced through the C2 catheter into the SMA trunk. Fathom guidewire was utilized to select the right colic branch. Selective right colic angiogram performed. Right colic: Right colic branches widely patent as well as the marginal and Vasa recta branches to the abnormal hepatic flexure. In the hepatic flexure, there is abnormal hypervascularity, tortuosity, and slow arterial oozing in the area of active bleeding. There are prominent early draining veins. 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 Microcatheter advanced into a supplying Vasa recta branch. Selective Vasa recta angiography performed. Right Vasa recta branch (1): Tortuous abnormal hypervascularity and blushing noted with early draining vein compatible with a supplying branch to the area of angio dysplasia. Catheter access point is safe for peripheral micro coil embolization. There was successful deployment of terumo 2 mm microcoils within the branch ranging in size from 4 cm to 2 cm. After deployment of the microcoils, this peripheral Vasa recta branch is occluded. Repeat injection through the microcatheter demonstrates additional peripheral branch supply to the abnormal vascularity. Microcatheter was retracted and utilized to select a second right vasa recta branch more superiorly. Right Vasa recta branch (2): This branch also supplies the tortuous abnormal hypervascular area with blushing and early draining veins. Catheter access location is safe for micro coil embolization. Additional 2 mm microcoils were successfully deployed in this second branch. Following micro coil  embolization, repeat angiography confirms significant reduction in the Vasa recta supply to the angio dysplasia. Repeat injection of the marginal artery demonstrates a third superior branch  with abnormal vascular supply. This branch was also successfully accessed with the microcatheter peripherally. Right Vasa recta branch (3): This branch also supplies the abnormal vascularity with early draining veins. Peripheral access is also safe for micro coil embolization. This branch was occluded with 2 mm and 3 mm microcoils successfully. Final post embolization angiogram through the marginal artery demonstrates significant reduction in the arterial supply to the area of presumed angio dysplasia. There is overall preserved marginal artery supply to the right colon. No additional large Vasa recta branch visualized supplying the abnormal vascularity. At this point the procedure was terminated. Microcatheter and C2 catheter both removed. Hemostasis obtained at the right CFA access site with the CELT device successfully. Patient tolerated the procedure well. There were no immediate complications. There was improvement in the patient's hemodynamics following the embolization. 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 This area correlated with the CTA. Electronically Signed   By: CHRISTELLA.  Shick M.D.   On: 05/08/2024 13:57   IR Angiogram Selective Each Additional Vessel Result Date: 05/08/2024 INDICATION: Acute right colon lower GI bleeding by CTA, associated blood-loss anemia, requiring volume resuscitation and transfusion EXAM: ULTRASOUND GUIDANCE FOR VASCULAR ACCESS SMA, RIGHT COLIC BRANCH, MARGINAL BRANCH, AND 3 SEPARATE VASA RECTA BRANCHES CATHETERIZATIONS, ANGIOGRAMS, AND PERIPHERAL MICRO COIL EMBOLIZATION MEDICATIONS: 1% lidocaine  local. ANESTHESIA/SEDATION: MAC provided  by the anesthesia service CONTRAST:  130 cc omni 300 FLUOROSCOPY: Radiation Exposure Index (as provided by the fluoroscopic device): 1,509 mGy Kerma COMPLICATIONS: None immediate. PROCEDURE: Informed consent was obtained from the patient following explanation of the procedure, risks, benefits and alternatives. The patient understands, agrees and consents for the procedure. All questions were addressed. A time out was performed prior to the initiation of the procedure. Maximal barrier sterile technique utilized including caps, mask, sterile gowns, sterile gloves, large sterile drape, hand hygiene, and Betadine  prep. Previous imaging reviewed. Under sterile conditions and local anesthesia, ultrasound micropuncture access performed of the patent right common femoral artery. Images obtained for documentation of the patent right common femoral artery. Five French sheath inserted over a Bentson guidewire. C2 catheter advanced initially utilized to select the SMA origin. SMA angiogram performed. SMA: SMA trunk and its branches are patent. Prominent right colic artery noted where there is active contrast extravasation into the hepatic flexure of the colon which correlates with the positive CTA findings. Progreat angled tip microcatheter was advanced through the C2 catheter into the SMA trunk. Fathom guidewire was utilized to select the right colic branch. Selective right colic angiogram performed. Right colic: Right colic branches widely patent as well as the marginal and Vasa recta branches to the abnormal hepatic flexure. In the hepatic flexure, there is abnormal hypervascularity, tortuosity, and slow arterial oozing in the area of active bleeding. There are prominent early draining veins. 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 Microcatheter advanced into a supplying Vasa recta branch. Selective Vasa recta angiography performed. Right Vasa recta branch (1): Tortuous abnormal hypervascularity and blushing  noted with early draining vein compatible with a supplying branch to the area of angio dysplasia. Catheter access point is safe for peripheral micro coil embolization. There was successful deployment of terumo 2 mm microcoils within the branch ranging in size from 4 cm to 2 cm. After deployment of the microcoils, this peripheral Vasa recta branch is occluded. Repeat injection through the microcatheter demonstrates additional peripheral branch supply  to the abnormal vascularity. Microcatheter was retracted and utilized to select a second right vasa recta branch more superiorly. Right Vasa recta branch (2): This branch also supplies the tortuous abnormal hypervascular area with blushing and early draining veins. Catheter access location is safe for micro coil embolization. Additional 2 mm microcoils were successfully deployed in this second branch. Following micro coil embolization, repeat angiography confirms significant reduction in the Vasa recta supply to the angio dysplasia. Repeat injection of the marginal artery demonstrates a third superior branch with abnormal vascular supply. This branch was also successfully accessed with the microcatheter peripherally. Right Vasa recta branch (3): This branch also supplies the abnormal vascularity with early draining veins. Peripheral access is also safe for micro coil embolization. This branch was occluded with 2 mm and 3 mm microcoils successfully. Final post embolization angiogram through the marginal artery demonstrates significant reduction in the arterial supply to the area of presumed angio dysplasia. There is overall preserved marginal artery supply to the right colon. No additional large Vasa recta branch visualized supplying the abnormal vascularity. At this point the procedure was terminated. Microcatheter and C2 catheter both removed. Hemostasis obtained at the right CFA access site with the CELT device successfully. Patient tolerated the procedure well. There  were no immediate complications. There was improvement in the patient's hemodynamics following the embolization. 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 This area correlated with the CTA. Electronically Signed   By: CHRISTELLA.  Shick M.D.   On: 05/08/2024 13:57   IR Angiogram Selective Each Additional Vessel Result Date: 05/08/2024 INDICATION: Acute right colon lower GI bleeding by CTA, associated blood-loss anemia, requiring volume resuscitation and transfusion EXAM: ULTRASOUND GUIDANCE FOR VASCULAR ACCESS SMA, RIGHT COLIC BRANCH, MARGINAL BRANCH, AND 3 SEPARATE VASA RECTA BRANCHES CATHETERIZATIONS, ANGIOGRAMS, AND PERIPHERAL MICRO COIL EMBOLIZATION MEDICATIONS: 1% lidocaine  local. ANESTHESIA/SEDATION: MAC provided by the anesthesia service CONTRAST:  130 cc omni 300 FLUOROSCOPY: Radiation Exposure Index (as provided by the fluoroscopic device): 1,509 mGy Kerma COMPLICATIONS: None immediate. PROCEDURE: Informed consent was obtained from the patient following explanation of the procedure, risks, benefits and alternatives. The patient understands, agrees and consents for the procedure. All questions were addressed. A time out was performed prior to the initiation of the procedure. Maximal barrier sterile technique utilized including caps, mask, sterile gowns, sterile gloves, large sterile drape, hand hygiene, and Betadine  prep. Previous imaging reviewed. Under sterile conditions and local anesthesia, ultrasound micropuncture access performed of the patent right common femoral artery. Images obtained for documentation of the patent right common femoral artery. Five French sheath inserted over a Bentson guidewire. C2 catheter advanced initially utilized to select the SMA origin. SMA angiogram performed. SMA: SMA trunk and its branches are patent. Prominent right colic  artery noted where there is active contrast extravasation into the hepatic flexure of the colon which correlates with the positive CTA findings. Progreat angled tip microcatheter was advanced through the C2 catheter into the SMA trunk. Fathom guidewire was utilized to select the right colic branch. Selective right colic angiogram performed. Right colic: Right colic branches widely patent as well as the marginal and Vasa recta branches to the abnormal hepatic flexure. In the hepatic flexure, there is abnormal hypervascularity, tortuosity, and slow arterial oozing in the area of active bleeding. There are prominent early draining veins. 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 Microcatheter advanced into a  supplying Vasa recta branch. Selective Vasa recta angiography performed. Right Vasa recta branch (1): Tortuous abnormal hypervascularity and blushing noted with early draining vein compatible with a supplying branch to the area of angio dysplasia. Catheter access point is safe for peripheral micro coil embolization. There was successful deployment of terumo 2 mm microcoils within the branch ranging in size from 4 cm to 2 cm. After deployment of the microcoils, this peripheral Vasa recta branch is occluded. Repeat injection through the microcatheter demonstrates additional peripheral branch supply to the abnormal vascularity. Microcatheter was retracted and utilized to select a second right vasa recta branch more superiorly. Right Vasa recta branch (2): This branch also supplies the tortuous abnormal hypervascular area with blushing and early draining veins. Catheter access location is safe for micro coil embolization. Additional 2 mm microcoils were successfully deployed in this second branch. Following micro coil embolization, repeat angiography confirms significant reduction in the Vasa recta supply to the angio dysplasia. Repeat injection of the marginal artery demonstrates a third superior branch  with abnormal vascular supply. This branch was also successfully accessed with the microcatheter peripherally. Right Vasa recta branch (3): This branch also supplies the abnormal vascularity with early draining veins. Peripheral access is also safe for micro coil embolization. This branch was occluded with 2 mm and 3 mm microcoils successfully. Final post embolization angiogram through the marginal artery demonstrates significant reduction in the arterial supply to the area of presumed angio dysplasia. There is overall preserved marginal artery supply to the right colon. No additional large Vasa recta branch visualized supplying the abnormal vascularity. At this point the procedure was terminated. Microcatheter and C2 catheter both removed. Hemostasis obtained at the right CFA access site with the CELT device successfully. Patient tolerated the procedure well. There were no immediate complications. There was improvement in the patient's hemodynamics following the embolization. 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 This area correlated with the CTA. Electronically Signed   By: CHRISTELLA.  Shick M.D.   On: 05/08/2024 13:57   IR EMBO ART  VEN HEMORR LYMPH EXTRAV  INC GUIDE ROADMAPPING Result Date: 05/08/2024 INDICATION: Acute right colon lower GI bleeding by CTA, associated blood-loss anemia, requiring volume resuscitation and transfusion EXAM: ULTRASOUND GUIDANCE FOR VASCULAR ACCESS SMA, RIGHT COLIC BRANCH, MARGINAL BRANCH, AND 3 SEPARATE VASA RECTA BRANCHES CATHETERIZATIONS, ANGIOGRAMS, AND PERIPHERAL MICRO COIL EMBOLIZATION MEDICATIONS: 1% lidocaine  local. ANESTHESIA/SEDATION: MAC provided by the anesthesia service CONTRAST:  130 cc omni 300 FLUOROSCOPY: Radiation Exposure Index (as provided by the fluoroscopic device): 1,509 mGy Kerma COMPLICATIONS: None  immediate. PROCEDURE: Informed consent was obtained from the patient following explanation of the procedure, risks, benefits and alternatives. The patient understands, agrees and consents for the procedure. All questions were addressed. A time out was performed prior to the initiation of the procedure. Maximal barrier sterile technique utilized including caps, mask, sterile gowns, sterile gloves, large sterile drape, hand hygiene, and Betadine  prep. Previous imaging reviewed. Under sterile conditions and local anesthesia, ultrasound micropuncture access performed of the patent right common femoral artery. Images obtained for documentation of the patent right common femoral artery. Five French sheath inserted over a Bentson guidewire. C2 catheter advanced initially utilized to select the SMA origin. SMA angiogram performed. SMA: SMA trunk and its branches are patent. Prominent right colic artery noted where there is active contrast extravasation into the hepatic flexure of the  colon which correlates with the positive CTA findings. Progreat angled tip microcatheter was advanced through the C2 catheter into the SMA trunk. Fathom guidewire was utilized to select the right colic branch. Selective right colic angiogram performed. Right colic: Right colic branches widely patent as well as the marginal and Vasa recta branches to the abnormal hepatic flexure. In the hepatic flexure, there is abnormal hypervascularity, tortuosity, and slow arterial oozing in the area of active bleeding. There are prominent early draining veins. 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 Microcatheter advanced into a supplying Vasa recta branch. Selective Vasa recta angiography performed. Right Vasa recta branch (1): Tortuous abnormal hypervascularity and blushing noted with early draining vein compatible with a supplying branch to the area of angio dysplasia. Catheter access point is safe for peripheral micro coil embolization.  There was successful deployment of terumo 2 mm microcoils within the branch ranging in size from 4 cm to 2 cm. After deployment of the microcoils, this peripheral Vasa recta branch is occluded. Repeat injection through the microcatheter demonstrates additional peripheral branch supply to the abnormal vascularity. Microcatheter was retracted and utilized to select a second right vasa recta branch more superiorly. Right Vasa recta branch (2): This branch also supplies the tortuous abnormal hypervascular area with blushing and early draining veins. Catheter access location is safe for micro coil embolization. Additional 2 mm microcoils were successfully deployed in this second branch. Following micro coil embolization, repeat angiography confirms significant reduction in the Vasa recta supply to the angio dysplasia. Repeat injection of the marginal artery demonstrates a third superior branch with abnormal vascular supply. This branch was also successfully accessed with the microcatheter peripherally. Right Vasa recta branch (3): This branch also supplies the abnormal vascularity with early draining veins. Peripheral access is also safe for micro coil embolization. This branch was occluded with 2 mm and 3 mm microcoils successfully. Final post embolization angiogram through the marginal artery demonstrates significant reduction in the arterial supply to the area of presumed angio dysplasia. There is overall preserved marginal artery supply to the right colon. No additional large Vasa recta branch visualized supplying the abnormal vascularity. At this point the procedure was terminated. Microcatheter and C2 catheter both removed. Hemostasis obtained at the right CFA access site with the CELT device successfully. Patient tolerated the procedure well. There were no immediate complications. There was improvement in the patient's hemodynamics following the embolization. 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 This area correlated with the CTA. Electronically Signed   By: CHRISTELLA.  Shick M.D.   On: 05/08/2024 13:57   IR Angiogram Selective Each Additional Vessel Result Date: 05/08/2024 INDICATION: Acute right colon lower GI bleeding by CTA, associated blood-loss anemia, requiring volume resuscitation and transfusion EXAM: ULTRASOUND GUIDANCE FOR VASCULAR ACCESS SMA, RIGHT COLIC BRANCH, MARGINAL BRANCH, AND 3 SEPARATE VASA RECTA BRANCHES CATHETERIZATIONS, ANGIOGRAMS, AND PERIPHERAL MICRO COIL EMBOLIZATION MEDICATIONS: 1% lidocaine  local. ANESTHESIA/SEDATION: MAC provided by the anesthesia service CONTRAST:  130 cc omni 300 FLUOROSCOPY: Radiation Exposure Index (as provided by the fluoroscopic device): 1,509 mGy Kerma COMPLICATIONS: None immediate. PROCEDURE: Informed consent was obtained from the patient following explanation of the procedure, risks, benefits and alternatives. The patient understands, agrees and consents for the procedure. All questions were addressed. A time out was performed prior to the initiation of the procedure. Maximal barrier sterile technique utilized including caps, mask, sterile  gowns, sterile gloves, large sterile drape, hand hygiene, and Betadine  prep. Previous imaging reviewed. Under sterile conditions and local anesthesia, ultrasound micropuncture access performed of the patent right common femoral artery. Images obtained for documentation of the patent right common femoral artery. Five French sheath inserted over a Bentson guidewire. C2 catheter advanced initially utilized to select the SMA origin. SMA angiogram performed. SMA: SMA trunk and its branches are patent. Prominent right colic artery noted where there is active contrast extravasation into the hepatic flexure of the colon which correlates with the positive CTA findings. Progreat  angled tip microcatheter was advanced through the C2 catheter into the SMA trunk. Fathom guidewire was utilized to select the right colic branch. Selective right colic angiogram performed. Right colic: Right colic branches widely patent as well as the marginal and Vasa recta branches to the abnormal hepatic flexure. In the hepatic flexure, there is abnormal hypervascularity, tortuosity, and slow arterial oozing in the area of active bleeding. There are prominent early draining veins. 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 Microcatheter advanced into a supplying Vasa recta branch. Selective Vasa recta angiography performed. Right Vasa recta branch (1): Tortuous abnormal hypervascularity and blushing noted with early draining vein compatible with a supplying branch to the area of angio dysplasia. Catheter access point is safe for peripheral micro coil embolization. There was successful deployment of terumo 2 mm microcoils within the branch ranging in size from 4 cm to 2 cm. After deployment of the microcoils, this peripheral Vasa recta branch is occluded. Repeat injection through the microcatheter demonstrates additional peripheral branch supply to the abnormal vascularity. Microcatheter was retracted and utilized to select a second right vasa recta branch more superiorly. Right Vasa recta branch (2): This branch also supplies the tortuous abnormal hypervascular area with blushing and early draining veins. Catheter access location is safe for micro coil embolization. Additional 2 mm microcoils were successfully deployed in this second branch. Following micro coil embolization, repeat angiography confirms significant reduction in the Vasa recta supply to the angio dysplasia. Repeat injection of the marginal artery demonstrates a third superior branch with abnormal vascular supply. This branch was also successfully accessed with the microcatheter peripherally. Right Vasa recta branch (3): This branch  also supplies the abnormal vascularity with early draining veins. Peripheral access is also safe for micro coil embolization. This branch was occluded with 2 mm and 3 mm microcoils successfully. Final post embolization angiogram through the marginal artery demonstrates significant reduction in the arterial supply to the area of presumed angio dysplasia. There is overall preserved marginal artery supply to the right colon. No additional large Vasa recta branch visualized supplying the abnormal vascularity. At this point the procedure was terminated. Microcatheter and C2 catheter both removed. Hemostasis obtained at the right CFA access site with the CELT device successfully. Patient tolerated the procedure well. There were no immediate complications. There was improvement in the patient's hemodynamics following the embolization. 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 This area correlated with the CTA. Electronically Signed   By: CHRISTELLA.  Shick M.D.   On: 05/08/2024 13:57    Review of Systems  Constitutional:  Positive for appetite change.  HENT: Negative.    Eyes: Negative.   Respiratory:  Positive for shortness of breath.   Cardiovascular:  Negative for chest pain.  Gastrointestinal:  Positive for abdominal pain and blood in stool.  Endocrine: Negative.   Genitourinary: Negative.   Musculoskeletal: Negative.   Allergic/Immunologic: Negative.   Neurological: Negative.   Hematological: Negative.   Psychiatric/Behavioral: Negative.     Blood pressure (!) 109/53, pulse 94, temperature (!) 97.4 F (36.3 C), temperature source Axillary, resp. rate (!) 4, height 5' 5 (1.651 m), weight 88.5 kg, SpO2 95%. Physical Exam Cardiovascular:     Rate and Rhythm: Normal rate and regular rhythm.     Pulses: Normal pulses.  Pulmonary:     Effort: Pulmonary  effort is normal.     Breath sounds: Normal breath sounds. No wheezing.  Abdominal:     General: Abdomen is flat.     Palpations: Abdomen is soft.     Tenderness: There is no abdominal tenderness. There is no guarding or rebound.  Musculoskeletal:        General: No tenderness.  Skin:    General: Skin is warm.  Neurological:     Mental Status: She is alert and oriented to person, place, and time.     Assessment/Plan: Lower GI bleed from right colon Status post angioembolization of bleeding right colic branches 12/19 Follow-up CT angio today demonstrates no active bleeding but some thickening of her right colon -she has no tenderness on exam although she is having some abdominal pain on both sides.  There is no pneumatosis.  I would keep her n.p.o. except ice chips and we will follow her exam closely.  I discussed with Dr. Theophilus on the unit in detail.  She is undergoing blood product transfusion and they will be repeating hemoglobin and lactate levels.  I discussed the plan in detail with the patient as well.  Dann FORBES Hummer 05/09/2024, 10:18 AM         [1]  Allergies Allergen Reactions   Januvia  [Sitagliptin ] Other (See Comments)    Caused pancreatitis   Amlodipine  Swelling

## 2024-05-09 NOTE — Progress Notes (Signed)
 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 Vanice on 05/08/24.   Subjective:  Patient laying in bed, sleeping. Sister and RN at bedside.   Allergies: Januvia  [sitagliptin ] and Amlodipine   Medications: Prior to Admission medications  Medication Sig Start Date End Date Taking? Authorizing Provider  atorvastatin  (LIPITOR) 20 MG tablet TAKE 1 Tablet BY MOUTH ONCE EVERY DAY Patient taking differently: Take 20 mg by mouth daily. TAKE 1 Tablet BY MOUTH ONCE EVERY DAY 10/05/21  Yes Comer Kirsch, PA-C  cloNIDine  (CATAPRES ) 0.1 MG tablet Take 1 tablet (0.1 mg total) by mouth 2 (two) times daily. Patient taking differently: Take 0.1 mg by mouth daily. 10/05/21  Yes Comer Kirsch, PA-C  Dulaglutide (TRULICITY) 0.75 MG/0.5ML SOPN Inject 0.75 mg into the skin every Monday.   Yes [provider]  FEROSUL 325 (65 Fe) MG tablet Take 325 mg by mouth daily. 04/24/24  Yes [provider]  linaclotide  (LINZESS ) 72 MCG capsule Take 72 mcg by mouth daily as needed (constipation).   Yes [provider]  losartan -hydrochlorothiazide  (HYZAAR) 100-12.5 MG tablet Take 1 tablet by mouth daily. 11/09/22  Yes [provider]  metFORMIN  (GLUCOPHAGE ) 1000 MG tablet Take 1 tablet (1,000 mg total) by mouth 2 (two) times daily with a meal. 10/05/21  Yes Comer Kirsch, PA-C  pantoprazole  (PROTONIX ) 40 MG tablet Take 1 tablet (40 mg total) by mouth 2 (two) times daily before a meal. 04/12/24  Yes Sigdel, Santosh, MD  glipiZIDE  (GLUCOTROL  XL) 5 MG 24 hr tablet Take 1 tablet (5 mg total) by mouth daily with breakfast. 10/18/22   Lenis Ethelle ORN, MD     Vital Signs: BP (!) 103/48   Pulse 87   Temp 97.8 F (36.6 C) (Axillary)   Resp 13   Ht 5' 5 (1.651 m)   Wt 195 lb  (88.5 kg)   SpO2 99%   BMI 32.45 kg/m   Physical Exam Vitals reviewed.  Constitutional:      General: She is not in acute distress. Pulmonary:     Effort: Pulmonary effort is normal.  Abdominal:     General: Abdomen is flat.     Palpations: Abdomen is soft.  Musculoskeletal:     Cervical back: Neck supple.  Skin:    General: Skin is warm.     Coloration: Skin is not pale.     Comments: Positive dressing on R CFA puncture site. Site is unremarkable with no erythema, edema, tenderness, bleeding or drainage. Minimal amount of old, dry blood noted on the dressing. Dressing otherwise clean, dry, and intact. R DP 1+      Imaging: CT Angio Abd/Pel w/ and/or w/o Result Date: 05/09/2024 EXAM: CTA ABDOMEN AND PELVIS WITH AND WITHOUT CONTRAST 05/09/2024 09:40:06 AM TECHNIQUE: CTA images of the abdomen and pelvis with and without intravenous contrast. Three-dimensional MIP/volume rendered formations were performed. Automated exposure control, iterative reconstruction, and/or weight based adjustment of the mA/kV was utilized to reduce the radiation dose to as low as reasonably achievable. 75 mL of iohexol  (OMNIPAQUE ) 350 MG/ML injection was administered. COMPARISON: 05/07/2024. Subsegmental atelectasis identified within both lung bases. CLINICAL HISTORY: Lower GI bleed. FINDINGS: VASCULATURE: AORTA: Mild aortic atherosclerotic calcification. No acute finding. No abdominal aortic aneurysm. No dissection. CELIAC TRUNK: Status post coil  embolization branches of the superior mesenteric artery branch supplying the right colon and hepatic flexure region. No acute finding. No occlusion or significant stenosis. SUPERIOR MESENTERIC ARTERY: Status post coil embolization branches of the superior mesenteric artery branch supplying the right colon and hepatic flexure region. No acute finding. No occlusion or significant stenosis. RENAL ARTERIES: No acute finding. No occlusion or significant stenosis. ILIAC  ARTERIES: No acute finding. No occlusion or significant stenosis. LIVER: The liver is unremarkable. GALLBLADDER AND BILE DUCTS: Contrast material fills the gallbladder lumen. No biliary ductal dilatation. SPLEEN: The spleen is unremarkable. PANCREAS: The pancreas is unremarkable. ADRENAL GLANDS: Bilateral adrenal glands demonstrate no acute abnormality. KIDNEYS, URETERS AND BLADDER: Bosniak class 2 right kidney cysts are unchanged. The largest arises off the lateral cortex of the right kidney measuring 4.3 cm, image 98/18. No stones in the kidneys or ureters. No hydronephrosis. No perinephric or periureteral stranding. Urinary bladder is unremarkable. GI AND BOWEL: Stomach and duodenal sweep demonstrate no acute abnormality. There is no bowel obstruction. There is no brisk arterial phase extravasation of contrast into the colonic lumen. There is new low attenuation wall thickening involving the ascending colon with hyperemia of the mucosa on the delayed images with trace amount of intraluminal contrast noted on the delayed images. The trace intraluminal contrast on delayed images could represent very slow or intermittent bleeding. Colonic diverticulosis without signs of acute diverticulitis. There are no signs of active intraluminal contrast extravasation to indicate recurrent GI bleeding. There is no abnormal bowel dilatation. No abnormal bowel wall thickening or distension in the remainder of the visualized bowel. REPRODUCTIVE: Status post hysterectomy. No adnexal mass. PERITONEUM AND RETRPERITONEUM: No ascites or free air. No free fluid or fluid collections identified. LUNG BASE: Subsegmental atelectasis identified within both lung bases. LYMPH NODES: No lymphadenopathy. BONES AND SOFT TISSUES: No acute abnormality of the bones. No acute soft tissue abnormality. IMPRESSION: 1. No evidence of active arterial phase  intraluminal contrast extravasation. 2. New low attenuation wall thickening involving the ascending  colon with hyperemia of the mucosa and trace intraluminal contrast on delayed images in the setting of recent coil embolization of branches of the superior mesenteric artery supplying the right colon/hepatic flexure region; differential considerations include ischemic colitis, infectious colitis, or post-embolization changes. 3. Colonic diverticulosis without signs of acute diverticulitis. Electronically signed by: Waddell Calk MD 05/09/2024 10:07 AM EST RP Workstation: [id number]   IR Angiogram Visceral Selective Result Date: 05/08/2024 INDICATION: Acute right colon lower GI bleeding by CTA, associated blood-loss anemia, requiring volume resuscitation and transfusion EXAM: ULTRASOUND GUIDANCE FOR VASCULAR ACCESS SMA, RIGHT COLIC BRANCH, MARGINAL BRANCH, AND 3 SEPARATE VASA RECTA BRANCHES CATHETERIZATIONS, ANGIOGRAMS, AND PERIPHERAL MICRO COIL EMBOLIZATION MEDICATIONS: 1% lidocaine  local. ANESTHESIA/SEDATION: MAC provided by the anesthesia service CONTRAST:  130 cc omni 300 FLUOROSCOPY: Radiation Exposure Index (as provided by the fluoroscopic device): 1,509 mGy Kerma COMPLICATIONS: None immediate. PROCEDURE: Informed consent was obtained from the patient following explanation of the procedure, risks, benefits and alternatives. The patient understands, agrees and consents for the procedure. All questions were addressed. A time out was performed prior to the initiation of the procedure. Maximal barrier sterile technique utilized including caps, mask, sterile gowns, sterile gloves, large sterile drape, hand hygiene, and Betadine  prep. Previous imaging reviewed. Under sterile conditions and local anesthesia, ultrasound micropuncture access performed of the patent right common femoral artery. Images obtained for documentation of the patent right common femoral artery. Five French sheath inserted over a Bentson guidewire. C2 catheter advanced initially utilized  to select the SMA origin. SMA angiogram performed. SMA:  SMA trunk and its branches are patent. Prominent right colic artery noted where there is active contrast extravasation into the hepatic flexure of the colon which correlates with the positive CTA findings. Progreat angled tip microcatheter was advanced through the C2 catheter into the SMA trunk. Fathom guidewire was utilized to select the right colic branch. Selective right colic angiogram performed. Right colic: Right colic branches widely patent as well as the marginal and Vasa recta branches to the abnormal hepatic flexure. In the hepatic flexure, there is abnormal hypervascularity, tortuosity, and slow arterial oozing in the area of active bleeding. There are prominent early draining veins. 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 Microcatheter advanced into a supplying Vasa recta branch. Selective Vasa recta angiography performed. Right Vasa recta branch (1): Tortuous abnormal hypervascularity and blushing noted with early draining vein compatible with a supplying branch to the area of angio dysplasia. Catheter access point is safe for peripheral micro coil embolization. There was successful deployment of terumo 2 mm microcoils within the branch ranging in size from 4 cm to 2 cm. After deployment of the microcoils, this peripheral Vasa recta branch is occluded. Repeat injection through the microcatheter demonstrates additional peripheral branch supply to the abnormal vascularity. Microcatheter was retracted and utilized to select a second right vasa recta branch more superiorly. Right Vasa recta branch (2): This branch also supplies the tortuous abnormal hypervascular area with blushing and early draining veins. Catheter access location is safe for micro coil embolization. Additional 2 mm microcoils were successfully deployed in this second branch. Following micro coil embolization, repeat angiography confirms significant reduction in the Vasa recta supply to the angio dysplasia. Repeat injection  of the marginal artery demonstrates a third superior branch with abnormal vascular supply. This branch was also successfully accessed with the microcatheter peripherally. Right Vasa recta branch (3): This branch also supplies the abnormal vascularity with early draining veins. Peripheral access is also safe for micro coil embolization. This branch was occluded with 2 mm and 3 mm microcoils successfully. Final post embolization angiogram through the marginal artery demonstrates significant reduction in the arterial supply to the area of presumed angio dysplasia. There is overall preserved marginal artery supply to the right colon. No additional large Vasa recta branch visualized supplying the abnormal vascularity. At this point the procedure was terminated. Microcatheter and C2 catheter both removed. Hemostasis obtained at the right CFA access site with the CELT device successfully. Patient tolerated the procedure well. There were no immediate complications. There was improvement in the patient's hemodynamics following the embolization. 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 This area correlated with the CTA. Electronically Signed   By: CHRISTELLA.  Shick M.D.   On: 05/08/2024 13:57   IR US  Guide Vasc Access Right Result Date: 05/08/2024 INDICATION: Acute right colon lower GI bleeding by CTA, associated blood-loss anemia, requiring volume resuscitation and transfusion EXAM: ULTRASOUND GUIDANCE FOR VASCULAR ACCESS SMA, RIGHT COLIC BRANCH, MARGINAL BRANCH, AND 3 SEPARATE VASA RECTA BRANCHES CATHETERIZATIONS, ANGIOGRAMS, AND PERIPHERAL MICRO COIL EMBOLIZATION MEDICATIONS: 1% lidocaine  local. ANESTHESIA/SEDATION: MAC provided by the anesthesia service CONTRAST:  130 cc omni 300 FLUOROSCOPY: Radiation Exposure Index (as provided by the fluoroscopic device): 1,509 mGy  Kerma COMPLICATIONS: None immediate. PROCEDURE: Informed consent was obtained from the patient following explanation of the procedure, risks, benefits and alternatives.  The patient understands, agrees and consents for the procedure. All questions were addressed. A time out was performed prior to the initiation of the procedure. Maximal barrier sterile technique utilized including caps, mask, sterile gowns, sterile gloves, large sterile drape, hand hygiene, and Betadine  prep. Previous imaging reviewed. Under sterile conditions and local anesthesia, ultrasound micropuncture access performed of the patent right common femoral artery. Images obtained for documentation of the patent right common femoral artery. Five French sheath inserted over a Bentson guidewire. C2 catheter advanced initially utilized to select the SMA origin. SMA angiogram performed. SMA: SMA trunk and its branches are patent. Prominent right colic artery noted where there is active contrast extravasation into the hepatic flexure of the colon which correlates with the positive CTA findings. Progreat angled tip microcatheter was advanced through the C2 catheter into the SMA trunk. Fathom guidewire was utilized to select the right colic branch. Selective right colic angiogram performed. Right colic: Right colic branches widely patent as well as the marginal and Vasa recta branches to the abnormal hepatic flexure. In the hepatic flexure, there is abnormal hypervascularity, tortuosity, and slow arterial oozing in the area of active bleeding. There are prominent early draining veins. 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 Microcatheter advanced into a supplying Vasa recta branch. Selective Vasa recta angiography performed. Right Vasa recta branch (1): Tortuous abnormal hypervascularity and blushing noted with early draining vein compatible with a supplying branch to the area of angio dysplasia. Catheter access point is safe for peripheral  micro coil embolization. There was successful deployment of terumo 2 mm microcoils within the branch ranging in size from 4 cm to 2 cm. After deployment of the microcoils, this peripheral Vasa recta branch is occluded. Repeat injection through the microcatheter demonstrates additional peripheral branch supply to the abnormal vascularity. Microcatheter was retracted and utilized to select a second right vasa recta branch more superiorly. Right Vasa recta branch (2): This branch also supplies the tortuous abnormal hypervascular area with blushing and early draining veins. Catheter access location is safe for micro coil embolization. Additional 2 mm microcoils were successfully deployed in this second branch. Following micro coil embolization, repeat angiography confirms significant reduction in the Vasa recta supply to the angio dysplasia. Repeat injection of the marginal artery demonstrates a third superior branch with abnormal vascular supply. This branch was also successfully accessed with the microcatheter peripherally. Right Vasa recta branch (3): This branch also supplies the abnormal vascularity with early draining veins. Peripheral access is also safe for micro coil embolization. This branch was occluded with 2 mm and 3 mm microcoils successfully. Final post embolization angiogram through the marginal artery demonstrates significant reduction in the arterial supply to the area of presumed angio dysplasia. There is overall preserved marginal artery supply to the right colon. No additional large Vasa recta branch visualized supplying the abnormal vascularity. At this point the procedure was terminated. Microcatheter and C2 catheter both removed. Hemostasis obtained at the right CFA access site with the CELT device successfully. Patient tolerated the procedure well. There were no immediate complications. There was improvement in the patient's hemodynamics following the embolization. 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 This area correlated with the CTA. Electronically Signed   By: CHRISTELLA.  Shick M.D.   On: 05/08/2024 13:57   IR Angiogram Selective Each Additional Vessel Result Date: 05/08/2024 INDICATION: Acute right colon  lower GI bleeding by CTA, associated blood-loss anemia, requiring volume resuscitation and transfusion EXAM: ULTRASOUND GUIDANCE FOR VASCULAR ACCESS SMA, RIGHT COLIC BRANCH, MARGINAL BRANCH, AND 3 SEPARATE VASA RECTA BRANCHES CATHETERIZATIONS, ANGIOGRAMS, AND PERIPHERAL MICRO COIL EMBOLIZATION MEDICATIONS: 1% lidocaine  local. ANESTHESIA/SEDATION: MAC provided by the anesthesia service CONTRAST:  130 cc omni 300 FLUOROSCOPY: Radiation Exposure Index (as provided by the fluoroscopic device): 1,509 mGy Kerma COMPLICATIONS: None immediate. PROCEDURE: Informed consent was obtained from the patient following explanation of the procedure, risks, benefits and alternatives. The patient understands, agrees and consents for the procedure. All questions were addressed. A time out was performed prior to the initiation of the procedure. Maximal barrier sterile technique utilized including caps, mask, sterile gowns, sterile gloves, large sterile drape, hand hygiene, and Betadine  prep. Previous imaging reviewed. Under sterile conditions and local anesthesia, ultrasound micropuncture access performed of the patent right common femoral artery. Images obtained for documentation of the patent right common femoral artery. Five French sheath inserted over a Bentson guidewire. C2 catheter advanced initially utilized to select the SMA origin. SMA angiogram performed. SMA: SMA trunk and its branches are patent. Prominent right colic artery noted where there is active contrast extravasation into the hepatic flexure of the colon which correlates with the positive CTA  findings. Progreat angled tip microcatheter was advanced through the C2 catheter into the SMA trunk. Fathom guidewire was utilized to select the right colic branch. Selective right colic angiogram performed. Right colic: Right colic branches widely patent as well as the marginal and Vasa recta branches to the abnormal hepatic flexure. In the hepatic flexure, there is abnormal hypervascularity, tortuosity, and slow arterial oozing in the area of active bleeding. There are prominent early draining veins. 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 Microcatheter advanced into a supplying Vasa recta branch. Selective Vasa recta angiography performed. Right Vasa recta branch (1): Tortuous abnormal hypervascularity and blushing noted with early draining vein compatible with a supplying branch to the area of angio dysplasia. Catheter access point is safe for peripheral micro coil embolization. There was successful deployment of terumo 2 mm microcoils within the branch ranging in size from 4 cm to 2 cm. After deployment of the microcoils, this peripheral Vasa recta branch is occluded. Repeat injection through the microcatheter demonstrates additional peripheral branch supply to the abnormal vascularity. Microcatheter was retracted and utilized to select a second right vasa recta branch more superiorly. Right Vasa recta branch (2): This branch also supplies the tortuous abnormal hypervascular area with blushing and early draining veins. Catheter access location is safe for micro coil embolization. Additional 2 mm microcoils were successfully deployed in this second branch. Following micro coil embolization, repeat angiography confirms significant reduction in the Vasa recta supply to the angio dysplasia. Repeat injection of the marginal artery demonstrates a third superior branch with abnormal vascular supply. This branch was also successfully accessed with the microcatheter peripherally. Right Vasa recta branch  (3): This branch also supplies the abnormal vascularity with early draining veins. Peripheral access is also safe for micro coil embolization. This branch was occluded with 2 mm and 3 mm microcoils successfully. Final post embolization angiogram through the marginal artery demonstrates significant reduction in the arterial supply to the area of presumed angio dysplasia. There is overall preserved marginal artery supply to the right colon. No additional large Vasa recta branch visualized supplying the abnormal vascularity. At this point the procedure was terminated. Microcatheter and C2 catheter both removed. Hemostasis obtained at the right CFA access site with the  CELT device successfully. Patient tolerated the procedure well. There were no immediate complications. There was improvement in the patient's hemodynamics following the embolization. 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 This area correlated with the CTA. Electronically Signed   By: CHRISTELLA.  Shick M.D.   On: 05/08/2024 13:57   IR Angiogram Selective Each Additional Vessel Result Date: 05/08/2024 INDICATION: Acute right colon lower GI bleeding by CTA, associated blood-loss anemia, requiring volume resuscitation and transfusion EXAM: ULTRASOUND GUIDANCE FOR VASCULAR ACCESS SMA, RIGHT COLIC BRANCH, MARGINAL BRANCH, AND 3 SEPARATE VASA RECTA BRANCHES CATHETERIZATIONS, ANGIOGRAMS, AND PERIPHERAL MICRO COIL EMBOLIZATION MEDICATIONS: 1% lidocaine  local. ANESTHESIA/SEDATION: MAC provided by the anesthesia service CONTRAST:  130 cc omni 300 FLUOROSCOPY: Radiation Exposure Index (as provided by the fluoroscopic device): 1,509 mGy Kerma COMPLICATIONS: None immediate. PROCEDURE: Informed consent was obtained from the patient following explanation of the procedure, risks, benefits and alternatives. The patient  understands, agrees and consents for the procedure. All questions were addressed. A time out was performed prior to the initiation of the procedure. Maximal barrier sterile technique utilized including caps, mask, sterile gowns, sterile gloves, large sterile drape, hand hygiene, and Betadine  prep. Previous imaging reviewed. Under sterile conditions and local anesthesia, ultrasound micropuncture access performed of the patent right common femoral artery. Images obtained for documentation of the patent right common femoral artery. Five French sheath inserted over a Bentson guidewire. C2 catheter advanced initially utilized to select the SMA origin. SMA angiogram performed. SMA: SMA trunk and its branches are patent. Prominent right colic artery noted where there is active contrast extravasation into the hepatic flexure of the colon which correlates with the positive CTA findings. Progreat angled tip microcatheter was advanced through the C2 catheter into the SMA trunk. Fathom guidewire was utilized to select the right colic branch. Selective right colic angiogram performed. Right colic: Right colic branches widely patent as well as the marginal and Vasa recta branches to the abnormal hepatic flexure. In the hepatic flexure, there is abnormal hypervascularity, tortuosity, and slow arterial oozing in the area of active bleeding. There are prominent early draining veins. 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 Microcatheter advanced into a supplying Vasa recta branch. Selective Vasa recta angiography performed. Right Vasa recta branch (1): Tortuous abnormal hypervascularity and blushing noted with early draining vein compatible with a supplying branch to the area of angio dysplasia. Catheter access point is safe for peripheral micro coil embolization. There was successful deployment of terumo 2 mm microcoils within the branch ranging in size from 4 cm to 2 cm. After deployment of the microcoils, this  peripheral Vasa recta branch is occluded. Repeat injection through the microcatheter demonstrates additional peripheral branch supply to the abnormal vascularity. Microcatheter was retracted and utilized to select a second right vasa recta branch more superiorly. Right Vasa recta branch (2): This branch also supplies the tortuous abnormal hypervascular area with blushing and early draining veins. Catheter access location is safe for micro coil embolization. Additional 2 mm microcoils were successfully deployed in this second branch. Following micro coil embolization, repeat angiography confirms significant reduction in the Vasa recta supply to the angio dysplasia. Repeat injection of the marginal artery demonstrates a third superior branch with abnormal vascular supply. This branch was also successfully accessed with the microcatheter peripherally. Right Vasa recta branch (3): This branch also supplies the abnormal vascularity with early draining veins.  Peripheral access is also safe for micro coil embolization. This branch was occluded with 2 mm and 3 mm microcoils successfully. Final post embolization angiogram through the marginal artery demonstrates significant reduction in the arterial supply to the area of presumed angio dysplasia. There is overall preserved marginal artery supply to the right colon. No additional large Vasa recta branch visualized supplying the abnormal vascularity. At this point the procedure was terminated. Microcatheter and C2 catheter both removed. Hemostasis obtained at the right CFA access site with the CELT device successfully. Patient tolerated the procedure well. There were no immediate complications. There was improvement in the patient's hemodynamics following the embolization. 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 This area  correlated with the CTA. Electronically Signed   By: CHRISTELLA.  Shick M.D.   On: 05/08/2024 13:57   IR EMBO ART  VEN HEMORR LYMPH EXTRAV  INC GUIDE ROADMAPPING Result Date: 05/08/2024 INDICATION: Acute right colon lower GI bleeding by CTA, associated blood-loss anemia, requiring volume resuscitation and transfusion EXAM: ULTRASOUND GUIDANCE FOR VASCULAR ACCESS SMA, RIGHT COLIC BRANCH, MARGINAL BRANCH, AND 3 SEPARATE VASA RECTA BRANCHES CATHETERIZATIONS, ANGIOGRAMS, AND PERIPHERAL MICRO COIL EMBOLIZATION MEDICATIONS: 1% lidocaine  local. ANESTHESIA/SEDATION: MAC provided by the anesthesia service CONTRAST:  130 cc omni 300 FLUOROSCOPY: Radiation Exposure Index (as provided by the fluoroscopic device): 1,509 mGy Kerma COMPLICATIONS: None immediate. PROCEDURE: Informed consent was obtained from the patient following explanation of the procedure, risks, benefits and alternatives. The patient understands, agrees and consents for the procedure. All questions were addressed. A time out was performed prior to the initiation of the procedure. Maximal barrier sterile technique utilized including caps, mask, sterile gowns, sterile gloves, large sterile drape, hand hygiene, and Betadine  prep. Previous imaging reviewed. Under sterile conditions and local anesthesia, ultrasound micropuncture access performed of the patent right common femoral artery. Images obtained for documentation of the patent right common femoral artery. Five French sheath inserted over a Bentson guidewire. C2 catheter advanced initially utilized to select the SMA origin. SMA angiogram performed. SMA: SMA trunk and its branches are patent. Prominent right colic artery noted where there is active contrast extravasation into the hepatic flexure of the colon which correlates with the positive CTA findings. Progreat angled tip microcatheter was advanced through the C2 catheter into the SMA trunk. Fathom guidewire was utilized to select the right colic branch.  Selective right colic angiogram performed. Right colic: Right colic branches widely patent as well as the marginal and Vasa recta branches to the abnormal hepatic flexure. In the hepatic flexure, there is abnormal hypervascularity, tortuosity, and slow arterial oozing in the area of active bleeding. There are prominent early draining veins. 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 Microcatheter advanced into a supplying Vasa recta branch. Selective Vasa recta angiography performed. Right Vasa recta branch (1): Tortuous abnormal hypervascularity and blushing noted with early draining vein compatible with a supplying branch to the area of angio dysplasia. Catheter access point is safe for peripheral micro coil embolization. There was successful deployment of terumo 2 mm microcoils within the branch ranging in size from 4 cm to 2 cm. After deployment of the microcoils, this peripheral Vasa recta branch is occluded. Repeat injection through the microcatheter demonstrates additional peripheral branch supply to the abnormal vascularity. Microcatheter was retracted and utilized to select a second right vasa recta branch more superiorly. Right Vasa recta branch (2):  This branch also supplies the tortuous abnormal hypervascular area with blushing and early draining veins. Catheter access location is safe for micro coil embolization. Additional 2 mm microcoils were successfully deployed in this second branch. Following micro coil embolization, repeat angiography confirms significant reduction in the Vasa recta supply to the angio dysplasia. Repeat injection of the marginal artery demonstrates a third superior branch with abnormal vascular supply. This branch was also successfully accessed with the microcatheter peripherally. Right Vasa recta branch (3): This branch also supplies the abnormal vascularity with early draining veins. Peripheral access is also safe for micro coil embolization. This branch was  occluded with 2 mm and 3 mm microcoils successfully. Final post embolization angiogram through the marginal artery demonstrates significant reduction in the arterial supply to the area of presumed angio dysplasia. There is overall preserved marginal artery supply to the right colon. No additional large Vasa recta branch visualized supplying the abnormal vascularity. At this point the procedure was terminated. Microcatheter and C2 catheter both removed. Hemostasis obtained at the right CFA access site with the CELT device successfully. Patient tolerated the procedure well. There were no immediate complications. There was improvement in the patient's hemodynamics following the embolization. 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 This area correlated with the CTA. Electronically Signed   By: CHRISTELLA.  Shick M.D.   On: 05/08/2024 13:57   IR Angiogram Selective Each Additional Vessel Result Date: 05/08/2024 INDICATION: Acute right colon lower GI bleeding by CTA, associated blood-loss anemia, requiring volume resuscitation and transfusion EXAM: ULTRASOUND GUIDANCE FOR VASCULAR ACCESS SMA, RIGHT COLIC BRANCH, MARGINAL BRANCH, AND 3 SEPARATE VASA RECTA BRANCHES CATHETERIZATIONS, ANGIOGRAMS, AND PERIPHERAL MICRO COIL EMBOLIZATION MEDICATIONS: 1% lidocaine  local. ANESTHESIA/SEDATION: MAC provided by the anesthesia service CONTRAST:  130 cc omni 300 FLUOROSCOPY: Radiation Exposure Index (as provided by the fluoroscopic device): 1,509 mGy Kerma COMPLICATIONS: None immediate. PROCEDURE: Informed consent was obtained from the patient following explanation of the procedure, risks, benefits and alternatives. The patient understands, agrees and consents for the procedure. All questions were addressed. A time out was performed prior to the initiation of the procedure. Maximal  barrier sterile technique utilized including caps, mask, sterile gowns, sterile gloves, large sterile drape, hand hygiene, and Betadine  prep. Previous imaging reviewed. Under sterile conditions and local anesthesia, ultrasound micropuncture access performed of the patent right common femoral artery. Images obtained for documentation of the patent right common femoral artery. Five French sheath inserted over a Bentson guidewire. C2 catheter advanced initially utilized to select the SMA origin. SMA angiogram performed. SMA: SMA trunk and its branches are patent. Prominent right colic artery noted where there is active contrast extravasation into the hepatic flexure of the colon which correlates with the positive CTA findings. Progreat angled tip microcatheter was advanced through the C2 catheter into the SMA trunk. Fathom guidewire was utilized to select the right colic branch. Selective right colic angiogram performed. Right colic: Right colic branches widely patent as well as the marginal and Vasa recta branches to the abnormal hepatic flexure. In the hepatic flexure, there is abnormal hypervascularity, tortuosity, and slow arterial oozing in the area of active bleeding. There are prominent early draining veins. 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 Microcatheter advanced into a supplying Vasa recta branch. Selective Vasa recta angiography performed. Right Vasa recta branch (1): Tortuous abnormal hypervascularity and blushing noted with early draining vein  compatible with a supplying branch to the area of angio dysplasia. Catheter access point is safe for peripheral micro coil embolization. There was successful deployment of terumo 2 mm microcoils within the branch ranging in size from 4 cm to 2 cm. After deployment of the microcoils, this peripheral Vasa recta branch is occluded. Repeat injection through the microcatheter demonstrates additional peripheral branch supply to the abnormal  vascularity. Microcatheter was retracted and utilized to select a second right vasa recta branch more superiorly. Right Vasa recta branch (2): This branch also supplies the tortuous abnormal hypervascular area with blushing and early draining veins. Catheter access location is safe for micro coil embolization. Additional 2 mm microcoils were successfully deployed in this second branch. Following micro coil embolization, repeat angiography confirms significant reduction in the Vasa recta supply to the angio dysplasia. Repeat injection of the marginal artery demonstrates a third superior branch with abnormal vascular supply. This branch was also successfully accessed with the microcatheter peripherally. Right Vasa recta branch (3): This branch also supplies the abnormal vascularity with early draining veins. Peripheral access is also safe for micro coil embolization. This branch was occluded with 2 mm and 3 mm microcoils successfully. Final post embolization angiogram through the marginal artery demonstrates significant reduction in the arterial supply to the area of presumed angio dysplasia. There is overall preserved marginal artery supply to the right colon. No additional large Vasa recta branch visualized supplying the abnormal vascularity. At this point the procedure was terminated. Microcatheter and C2 catheter both removed. Hemostasis obtained at the right CFA access site with the CELT device successfully. Patient tolerated the procedure well. There were no immediate complications. There was improvement in the patient's hemodynamics following the embolization. 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 This area correlated with the CTA. Electronically Signed   By: CHRISTELLA.  Shick M.D.   On: 05/08/2024 13:57   CT Angio Abd/Pel w/ and/or w/o Addendum Date:  05/07/2024 ADDENDUM REPORT: 05/07/2024 10:57 ADDENDUM: Correction: The site of active gastrointestinal bleeding is in the hepatic flexure of the colon, not splenic. Electronically Signed   By: Lynwood Landy Raddle M.D.   On: 05/07/2024 10:57   Addendum Date: 05/07/2024 ADDENDUM REPORT: 05/07/2024 10:41 ADDENDUM: Critical Value/emergent results were called by telephone at the time of interpretation on 05/07/2024 at 10:40 am to provider Bari, charge nurse, who verbally acknowledged these results and will notify Dr. Arlon immediately. Electronically Signed   By: Lynwood Landy Raddle M.D.   On: 05/07/2024 10:41   Result Date: 05/07/2024 CLINICAL DATA:  Lower gastrointestinal bleeding EXAM: CTA ABDOMEN AND PELVIS WITHOUT AND WITH CONTRAST TECHNIQUE: Multidetector CT imaging of the abdomen and pelvis was performed using the standard protocol during bolus administration of intravenous contrast. Multiplanar reconstructed images and MIPs were obtained and reviewed to evaluate the vascular anatomy. RADIATION DOSE REDUCTION: This exam was performed according to the departmental dose-optimization program which includes automated exposure control, adjustment of the mA and/or kV according to patient size and/or use of iterative reconstruction technique. CONTRAST:  OMNIPAQUE  IOHEXOL  350 MG/ML SOLN COMPARISON:  April 08, 2024 FINDINGS: VASCULAR Aorta: Normal caliber aorta without aneurysm, dissection, vasculitis or significant stenosis. Celiac: Patent without evidence of aneurysm, dissection, vasculitis or significant stenosis. SMA: Patent without evidence of aneurysm, dissection, vasculitis or significant stenosis. Renals: Both renal arteries are patent without evidence of aneurysm, dissection, vasculitis,  fibromuscular dysplasia or significant stenosis. IMA: Patent without evidence of aneurysm, dissection, vasculitis or significant stenosis. Inflow: Patent without evidence of aneurysm, dissection, vasculitis or  significant stenosis. Proximal Outflow: Bilateral common femoral and visualized portions of the superficial and profunda femoral arteries are patent without evidence of aneurysm, dissection, vasculitis or significant stenosis. Veins: No obvious venous abnormality within the limitations of this arterial phase study. Review of the MIP images confirms the above findings. NON-VASCULAR Lower chest: No acute abnormality. Hepatobiliary: No focal liver abnormality is seen. No gallstones, gallbladder wall thickening, or biliary dilatation. Pancreas: Unremarkable. No pancreatic ductal dilatation or surrounding inflammatory changes. Spleen: Normal in size without focal abnormality. Adrenals/Urinary Tract: Adrenal glands appear normal. Stable right renal cyst. No hydronephrosis or renal obstruction noted. Urinary bladder is unremarkable. Stomach/Bowel: Stomach is within normal limits. Appendix appears normal. No evidence of bowel wall thickening, distention, or inflammatory changes. However, there appears to be an area active contrast extravasation involving the splenic flexure of the colon consistent with active gastrointestinal bleeding. Lymphatic: No adenopathy. Reproductive: Status post hysterectomy. No adnexal masses. Other: No abdominal wall hernia or abnormality. No abdominopelvic ascites. Musculoskeletal: No acute or significant osseous findings. IMPRESSION: Active contrast extravasation is seen involving the splenic flexure of the colon consistent with active gastrointestinal bleeding. Electronically Signed: By: Lynwood Landy Raddle M.D. On: 05/07/2024 10:33    Labs:  CBC: Recent Labs    05/07/24 0429 05/07/24 1426 05/08/24 0000 05/08/24 0715 05/08/24 1409 05/09/24 0651  WBC 12.4*  --   --  19.8* 24.9* 23.9*  HGB 5.7*   < > 7.3* 6.7* 10.7* 6.4*  HCT 18.4*   < > 21.3* 19.6* 33.4* 18.5*  PLT 211  --   --  100* 92* 112*   < > = values in this interval not displayed.    COAGS: Recent Labs     04/04/24 0621  INR 1.0    BMP: Recent Labs    05/07/24 0429 05/07/24 1426 05/08/24 0715 05/09/24 0651  NA 138 140 137 132*  K 6.1* 5.2* 4.9 5.2*  CL 107 110 110 104  CO2 26 20* 18* 19*  GLUCOSE 153* 167* 279* 265*  BUN 43* 38* 42* 44*  CALCIUM  7.9* 7.6* 6.9* 7.1*  CREATININE 1.13* 0.97 1.15* 1.35*  GFRNONAA 54* >60 53* 44*    LIVER FUNCTION TESTS: Recent Labs    04/04/24 0621 05/06/24 1147  BILITOT 0.2 0.3  AST 22 24  ALT 13 13  ALKPHOS 68 81  PROT 6.7 7.2  ALBUMIN 3.8 3.9    Assessment and Plan:  63 y.o. 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 Vanice on 05/08/24.    BP remain soft but stable, not on pressor, HR wnl Patient continued to have hematochezia after emo yesterday, Hgb dropped from 10.7 to 6.4 overnight, getting blood, general surgery consulted by PCCM  R CFA puncture site w/o appreciable hematoma or pseudoaneurysm R DP 1+  Further treatment plan per PCCM/general surgery Appreciate and agree with the plan.  Please call IR for questions and concerns.    Electronically Signed: Toya VEAR Cousin, PA-C 05/09/2024, 10:42 AM   I spent a total of 15 Minutes at the the patient's bedside AND on the patient's hospital floor or unit, greater than 50% of which was counseling/coordinating care for LGIB embo f/u.   This chart was dictated using voice recognition software.  Despite best efforts to  proofread,  errors can occur which can change the documentation meaning.   "

## 2024-05-09 NOTE — Progress Notes (Signed)
 RN was given in report pt had medium sized bm with bright red blood. Upon assessment pt complaining of 5/10 abd pain. BP 118/51 (70). Pt needed to have BM. Pt ambulated to bathroom. Medium sized bm mostly liquid bright red blood. Pt complaining of dizziness, BP 83/52 (62). Hgb this am 6.4. MD made aware. 2 units PRBC ordered.

## 2024-05-09 NOTE — Plan of Care (Signed)
" °  Problem: Education: Goal: Knowledge of General Education information will improve Description: Including pain rating scale, medication(s)/side effects and non-pharmacologic comfort measures Outcome: Progressing   Problem: Health Behavior/Discharge Planning: Goal: Ability to manage health-related needs will improve Outcome: Progressing   Problem: Clinical Measurements: Goal: Ability to maintain clinical measurements within normal limits will improve Outcome: Progressing Goal: Will remain free from infection Outcome: Progressing Goal: Diagnostic test results will improve Outcome: Progressing Goal: Respiratory complications will improve Outcome: Progressing Goal: Cardiovascular complication will be avoided Outcome: Progressing   Problem: Activity: Goal: Risk for activity intolerance will decrease Outcome: Progressing   Problem: Nutrition: Goal: Adequate nutrition will be maintained Outcome: Progressing   Problem: Coping: Goal: Level of anxiety will decrease Outcome: Progressing   Problem: Elimination: Goal: Will not experience complications related to bowel motility Outcome: Progressing Goal: Will not experience complications related to urinary retention Outcome: Progressing   Problem: Pain Managment: Goal: General experience of comfort will improve and/or be controlled Outcome: Progressing   Problem: Safety: Goal: Ability to remain free from injury will improve Outcome: Progressing   Problem: Skin Integrity: Goal: Risk for impaired skin integrity will decrease Outcome: Progressing   Problem: Coping: Goal: Ability to adjust to condition or change in health will improve Outcome: Progressing   Problem: Fluid Volume: Goal: Ability to maintain a balanced intake and output will improve Outcome: Progressing   Problem: Metabolic: Goal: Ability to maintain appropriate glucose levels will improve Outcome: Progressing   Problem: Skin Integrity: Goal: Risk for  impaired skin integrity will decrease Outcome: Progressing   Problem: Tissue Perfusion: Goal: Adequacy of tissue perfusion will improve Outcome: Progressing   Problem: Activity: Goal: Ability to return to baseline activity level will improve Outcome: Progressing   Problem: Education: Goal: Understanding of CV disease, CV risk reduction, and recovery process will improve Outcome: Progressing Goal: Individualized Educational Video(s) Outcome: Progressing   Problem: Cardiovascular: Goal: Ability to achieve and maintain adequate cardiovascular perfusion will improve Outcome: Progressing Goal: Vascular access site(s) Level 0-1 will be maintained Outcome: Progressing   Problem: Health Behavior/Discharge Planning: Goal: Ability to safely manage health-related needs after discharge will improve Outcome: Progressing   "

## 2024-05-09 NOTE — Progress Notes (Signed)
 Date and time results received: 05/09/2024 0755 (use smartphrase .now to insert current time)  Test: Hgb Critical Value: 6.4  Name of Provider Notified: Dr. Theophilus  Orders Received? Or Actions Taken?: 2 units PRBC ordered, MD to contact IR

## 2024-05-09 NOTE — Consult Note (Signed)
 Reason for Consult: Right sided colonic GI bleed Referring Physician: CCM  Casey Johnston HPI: This is a 63 year old female with a PMH of gastric ulcers (Forrest III), duodenal ulcers (Forrest III), duodenal AVMs, HTN, hyperlipidemia, and GERD transferred from [facility] for IR embolization.  The patient was recently discharged from [facility] for GI bleed, but an overt GI bleeding source was not located.  The colonoscopy performed by Dr. Henreitta on 04/07/2024 noted that there was fresh blood throughout the colon.  During that hospitalization she received 9 units of PRBC.  She represented ton 05/06/2024 to [facility] with hematochezia.  Her HGB upon presentation was at 7.8 g/dL and she was hypotensive.  The plan was to repeat the EGD/colonoscopy once she was stable, but her hemodynamic instability worsened.  A CTA was performed and there was evidence of bleeding at the hepatic flexure.  It was felt that she would be better served with IR embolization.  The patient was transferred to [facility] and successful microcoil embolization of the right colic branches of the right colon were performed.  Unfortunately she continued to bleed as her HGB dropped from 10.7 g/dL down to 6.4 g/dL.  A repeat CTA was performed and there was evidence of some wall thickening in the ascending colon.  This represented the possibility of an ischemic colitis or post-embolization changes.  Surgery was consulted, but she did not appear to be in need for surgical intervention.  Past Medical History:  Diagnosis Date   Anxiety and depression    Chronic constipation    Diabetes mellitus, type II (HCC)    Dr. Lenis managing   Frequent headaches chronic   ibuprofen helps some   GERD (gastroesophageal reflux disease)    History of UTI    Recurrent per pt (approx 3 per year)   Hyperlipidemia    Hypertension    Leukocytosis 12/2015   mild lymphocytosis.  Path smear review reassuring.  Repeat 01/2016 stable.   Morbid obesity  (HCC)    OSA on CPAP    not using CPAP anymore    Past Surgical History:  Procedure Laterality Date   ABDOMINAL HYSTERECTOMY  1998   Dysfunctional uterine bleeding.  No hx of abnormal paps.   BIOPSY  10/20/2018   Procedure: BIOPSY;  Surgeon: Harvey Margo CROME, MD;  [facility];  Service: Endoscopy;;  stomach   CATARACT EXTRACTION W/PHACO Left 08/24/2022   Procedure: CATARACT EXTRACTION PHACO AND INTRAOCULAR LENS PLACEMENT (IOC);  Surgeon: Harrie Agent, MD;  Location: [facility];  Service: Ophthalmology;  Laterality: Left;  CDE 8.53   CATARACT EXTRACTION W/PHACO Right 09/07/2022   Procedure: CATARACT EXTRACTION PHACO AND INTRAOCULAR LENS PLACEMENT (IOC);  Surgeon: Harrie Agent, MD;  Location: [facility];  Service: Ophthalmology;  Laterality: Right;  CDE: 10.02   COLONOSCOPY  2012   Dr. Gaylia: 1.5 cm pendunculated descending colon polyp (tubulovillous adenoma) and diminutive hyperplastic polyps   COLONOSCOPY N/A 10/20/2018   Procedure: COLONOSCOPY;  Surgeon: Harvey Margo CROME, MD; 4 tubular adenomas, diverticulosis in the sigmoid colon, tortuous left colon, external and internal hemorrhoids. Due for repeat in 2023.    COLONOSCOPY N/A 04/07/2024   Procedure: COLONOSCOPY;  Surgeon: Eartha Angelia Sieving, MD;  [facility]: [facility];  Service: Gastroenterology;  Laterality: N/A;   COLONOSCOPY WITH PROPOFOL  N/A 12/24/2022   Procedure: COLONOSCOPY WITH PROPOFOL ;  Surgeon: Cindie Carlin POUR, DO;  [facility];  Service: Endoscopy;  Laterality: N/A;  11:15 am, asa 3  ENTEROSCOPY N/A 04/11/2024   Procedure: ENTEROSCOPY;  Surgeon: Cindie Carlin POUR, DO;  [facility];  Service: Endoscopy;  Laterality: N/A;   ESOPHAGOGASTRODUODENOSCOPY N/A 10/20/2018   Procedure: ESOPHAGOGASTRODUODENOSCOPY (EGD);  Surgeon: Harvey Margo CROME, MD;  gastritis and duodenitis. No H. Pylori.    ESOPHAGOGASTRODUODENOSCOPY N/A 04/07/2024   Procedure: EGD (ESOPHAGOGASTRODUODENOSCOPY);  Surgeon: Eartha Flavors, Toribio, MD;  [facility];  Service: Gastroenterology;  Laterality: N/A;  WITH ENTEROSCOPY   ESOPHAGOGASTRODUODENOSCOPY N/A 04/05/2024   Procedure: EGD (ESOPHAGOGASTRODUODENOSCOPY);  Surgeon: Cinderella Deatrice FALCON, MD;  [facility];  Service: Endoscopy;  Laterality: N/A;   GIVENS CAPSULE STUDY N/A 04/09/2024   Procedure: IMAGING PROCEDURE, GI TRACT, INTRALUMINAL, VIA CAPSULE;  Surgeon: Cinderella Deatrice FALCON, MD;  [facility];  Service: Endoscopy;  Laterality: N/A;   IR ANGIOGRAM SELECTIVE EACH ADDITIONAL VESSEL  05/08/2024   IR ANGIOGRAM SELECTIVE EACH ADDITIONAL VESSEL  05/08/2024   IR ANGIOGRAM SELECTIVE EACH ADDITIONAL VESSEL  05/08/2024   IR ANGIOGRAM VISCERAL SELECTIVE  05/08/2024   IR EMBO ART  VEN HEMORR LYMPH EXTRAV  INC GUIDE ROADMAPPING  05/08/2024   IR US  GUIDE VASC ACCESS RIGHT  05/08/2024   POLYPECTOMY  10/20/2018   Procedure: POLYPECTOMY;  Surgeon: Harvey Margo CROME, MD;  [facility];  Service: Endoscopy;;   POLYPECTOMY  12/24/2022   Procedure: POLYPECTOMY INTESTINAL;  Surgeon: Cindie Carlin POUR, DO;  [facility];  Service: Endoscopy;;   wart removal  08/04/2019   benign    Family History  Problem Relation Age of Onset   Cancer Mother        ovarian   COPD Father    Diabetes Father    Hyperlipidemia Father    Heart attack Father    Heart failure Father    Colon cancer Neg Hx     Social History:  reports that she has been smoking cigarettes. She has a 5 pack-year smoking history. She has never used smokeless tobacco. She reports that she does not drink alcohol and does not use drugs.  Allergies: Allergies[1]  Medications: Scheduled:  atorvastatin   20 mg Oral Daily   Chlorhexidine  Gluconate Cloth  6 each Topical Daily   insulin  aspart  0-15 Units Subcutaneous TID WC   insulin  aspart  0-5 Units Subcutaneous QHS   pantoprazole  (PROTONIX ) IV  40 mg Intravenous Q12H   sodium chloride  flush  3 mL Intravenous Q12H    Continuous:  Results for orders placed or performed during the hospital encounter of 05/06/24 (from the past 24 hours)  Glucose, capillary     Status: Abnormal   Collection Time: 05/08/24  5:11 PM  Result Value Ref Range   Glucose-Capillary 249 (H) 70 - 99 mg/dL  Glucose, capillary     Status: Abnormal   Collection Time: 05/08/24  9:24 PM  Result Value Ref Range   Glucose-Capillary 330 (H) 70 - 99 mg/dL  CBC     Status: Abnormal   Collection Time: 05/09/24  6:51 AM  Result Value Ref Range   WBC 23.9 (H) 4.0 - 10.5 K/uL   RBC 2.08 (L) 3.87 - 5.11 MIL/uL   Hemoglobin 6.4 (LL) 12.0 - 15.0 g/dL   HCT 81.4 (L) 63.9 - 53.9 %   MCV 88.9 80.0 - 100.0 fL   MCH 30.8 26.0 - 34.0 pg   MCHC 34.6 30.0 - 36.0 g/dL   RDW 84.0 (H) 88.4 - 84.4 %   Platelets 112 (L) 150 - 400  K/uL   nRBC 0.5 (H) 0.0 - 0.2 %  Basic metabolic panel with GFR     Status: Abnormal   Collection Time: 05/09/24  6:51 AM  Result Value Ref Range   Sodium 132 (L) 135 - 145 mmol/L   Potassium 5.2 (H) 3.5 - 5.1 mmol/L   Chloride 104 98 - 111 mmol/L   CO2 19 (L) 22 - 32 mmol/L   Glucose, Bld 265 (H) 70 - 99 mg/dL   BUN 44 (H) 8 - 23 mg/dL   Creatinine, Ser 8.64 (H) 0.44 - 1.00 mg/dL   Calcium  7.1 (L) 8.9 - 10.3 mg/dL   GFR, Estimated 44 (L) >60 mL/min   Anion gap 8 5 - 15  Glucose, capillary     Status: Abnormal   Collection Time: 05/09/24  8:04 AM  Result Value Ref Range   Glucose-Capillary 283 (H) 70 - 99 mg/dL  Prepare RBC (crossmatch)     Status: None   Collection Time: 05/09/24  8:30 AM  Result Value Ref Range   Order Confirmation      ORDER PROCESSED BY BLOOD BANK Performed at [facility], [address] [place], [place] [address]   Lactic acid, plasma     Status: Abnormal   Collection Time: 05/09/24  8:34 AM  Result Value Ref Range   Lactic Acid, Venous 2.3 (HH) 0.5 - 1.9 mmol/L  Glucose, capillary     Status: Abnormal   Collection Time: 05/09/24 11:33 AM  Result Value Ref Range    Glucose-Capillary 225 (H) 70 - 99 mg/dL  Hemoglobin and hematocrit, blood     Status: Abnormal   Collection Time: 05/09/24  2:13 PM  Result Value Ref Range   Hemoglobin 9.3 (L) 12.0 - 15.0 g/dL   HCT 72.6 (L) 63.9 - 53.9 %  Lactic acid, plasma     Status: Abnormal   Collection Time: 05/09/24  2:13 PM  Result Value Ref Range   Lactic Acid, Venous 2.1 (HH) 0.5 - 1.9 mmol/L     CT Angio Abd/Pel w/ and/or w/o Result Date: 05/09/2024 EXAM: CTA ABDOMEN AND PELVIS WITH AND WITHOUT CONTRAST 05/09/2024 09:40:06 AM TECHNIQUE: CTA images of the abdomen and pelvis with and without intravenous contrast. Three-dimensional MIP/volume rendered formations were performed. Automated exposure control, iterative reconstruction, and/or weight based adjustment of the mA/kV was utilized to reduce the radiation dose to as low as reasonably achievable. 75 mL of iohexol  (OMNIPAQUE ) 350 MG/ML injection was administered. COMPARISON: 05/07/2024. Subsegmental atelectasis identified within both lung bases. CLINICAL HISTORY: Lower GI bleed. FINDINGS: VASCULATURE: AORTA: Mild aortic atherosclerotic calcification. No acute finding. No abdominal aortic aneurysm. No dissection. CELIAC TRUNK: Status post coil embolization branches of the superior mesenteric artery branch supplying the right colon and hepatic flexure region. No acute finding. No occlusion or significant stenosis. SUPERIOR MESENTERIC ARTERY: Status post coil embolization branches of the superior mesenteric artery branch supplying the right colon and hepatic flexure region. No acute finding. No occlusion or significant stenosis. RENAL ARTERIES: No acute finding. No occlusion or significant stenosis. ILIAC ARTERIES: No acute finding. No occlusion or significant stenosis. LIVER: The liver is unremarkable. GALLBLADDER AND BILE DUCTS: Contrast material fills the gallbladder lumen. No biliary ductal dilatation. SPLEEN: The spleen is unremarkable. PANCREAS: The pancreas is  unremarkable. ADRENAL GLANDS: Bilateral adrenal glands demonstrate no acute abnormality. KIDNEYS, URETERS AND BLADDER: Bosniak class 2 right kidney cysts are unchanged. The largest arises off the lateral cortex of the right kidney measuring 4.3 cm,  image 98/18. No stones in the kidneys or ureters. No hydronephrosis. No perinephric or periureteral stranding. Urinary bladder is unremarkable. GI AND BOWEL: Stomach and duodenal sweep demonstrate no acute abnormality. There is no bowel obstruction. There is no brisk arterial phase extravasation of contrast into the colonic lumen. There is new low attenuation wall thickening involving the ascending colon with hyperemia of the mucosa on the delayed images with trace amount of intraluminal contrast noted on the delayed images. The trace intraluminal contrast on delayed images could represent very slow or intermittent bleeding. Colonic diverticulosis without signs of acute diverticulitis. There are no signs of active intraluminal contrast extravasation to indicate recurrent GI bleeding. There is no abnormal bowel dilatation. No abnormal bowel wall thickening or distension in the remainder of the visualized bowel. REPRODUCTIVE: Status post hysterectomy. No adnexal mass. PERITONEUM AND RETRPERITONEUM: No ascites or free air. No free fluid or fluid collections identified. LUNG BASE: Subsegmental atelectasis identified within both lung bases. LYMPH NODES: No lymphadenopathy. BONES AND SOFT TISSUES: No acute abnormality of the bones. No acute soft tissue abnormality. IMPRESSION: 1. No evidence of active arterial phase  intraluminal contrast extravasation. 2. New low attenuation wall thickening involving the ascending colon with hyperemia of the mucosa and trace intraluminal contrast on delayed images in the setting of recent coil embolization of branches of the superior mesenteric artery supplying the right colon/hepatic flexure region; differential considerations include  ischemic colitis, infectious colitis, or post-embolization changes. 3. Colonic diverticulosis without signs of acute diverticulitis. Electronically signed by: Waddell Calk MD 05/09/2024 10:07 AM EST RP Workstation: [id number]   IR Angiogram Visceral Selective Result Date: 05/08/2024 INDICATION: Acute right colon lower GI bleeding by CTA, associated blood-loss anemia, requiring volume resuscitation and transfusion EXAM: ULTRASOUND GUIDANCE FOR VASCULAR ACCESS SMA, RIGHT COLIC BRANCH, MARGINAL BRANCH, AND 3 SEPARATE VASA RECTA BRANCHES CATHETERIZATIONS, ANGIOGRAMS, AND PERIPHERAL MICRO COIL EMBOLIZATION MEDICATIONS: 1% lidocaine  local. ANESTHESIA/SEDATION: MAC provided by the anesthesia service CONTRAST:  130 cc omni 300 FLUOROSCOPY: Radiation Exposure Index (as provided by the fluoroscopic device): 1,509 mGy Kerma COMPLICATIONS: None immediate. PROCEDURE: Informed consent was obtained from the patient following explanation of the procedure, risks, benefits and alternatives. The patient understands, agrees and consents for the procedure. All questions were addressed. A time out was performed prior to the initiation of the procedure. Maximal barrier sterile technique utilized including caps, mask, sterile gowns, sterile gloves, large sterile drape, hand hygiene, and Betadine  prep. Previous imaging reviewed. Under sterile conditions and local anesthesia, ultrasound micropuncture access performed of the patent right common femoral artery. Images obtained for documentation of the patent right common femoral artery. Five French sheath inserted over a Bentson guidewire. C2 catheter advanced initially utilized to select the SMA origin. SMA angiogram performed. SMA: SMA trunk and its branches are patent. Prominent right colic artery noted where there is active contrast extravasation into the hepatic flexure of the colon which correlates with the positive CTA findings. Progreat angled tip microcatheter was advanced  through the C2 catheter into the SMA trunk. Fathom guidewire was utilized to select the right colic branch. Selective right colic angiogram performed. Right colic: Right colic branches widely patent as well as the marginal and Vasa recta branches to the abnormal hepatic flexure. In the hepatic flexure, there is abnormal hypervascularity, tortuosity, and slow arterial oozing in the area of active bleeding. There are prominent early draining veins. 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 Microcatheter advanced into a supplying Vasa recta branch. Selective Vasa recta angiography performed. Right Vasa  recta branch (1): Tortuous abnormal hypervascularity and blushing noted with early draining vein compatible with a supplying branch to the area of angio dysplasia. Catheter access point is safe for peripheral micro coil embolization. There was successful deployment of terumo 2 mm microcoils within the branch ranging in size from 4 cm to 2 cm. After deployment of the microcoils, this peripheral Vasa recta branch is occluded. Repeat injection through the microcatheter demonstrates additional peripheral branch supply to the abnormal vascularity. Microcatheter was retracted and utilized to select a second right vasa recta branch more superiorly. Right Vasa recta branch (2): This branch also supplies the tortuous abnormal hypervascular area with blushing and early draining veins. Catheter access location is safe for micro coil embolization. Additional 2 mm microcoils were successfully deployed in this second branch. Following micro coil embolization, repeat angiography confirms significant reduction in the Vasa recta supply to the angio dysplasia. Repeat injection of the marginal artery demonstrates a third superior branch with abnormal vascular supply. This branch was also successfully accessed with the microcatheter peripherally. Right Vasa recta branch (3): This branch also supplies the abnormal vascularity  with early draining veins. Peripheral access is also safe for micro coil embolization. This branch was occluded with 2 mm and 3 mm microcoils successfully. Final post embolization angiogram through the marginal artery demonstrates significant reduction in the arterial supply to the area of presumed angio dysplasia. There is overall preserved marginal artery supply to the right colon. No additional large Vasa recta branch visualized supplying the abnormal vascularity. At this point the procedure was terminated. Microcatheter and C2 catheter both removed. Hemostasis obtained at the right CFA access site with the CELT device successfully. Patient tolerated the procedure well. There were no immediate complications. There was improvement in the patient's hemodynamics following the embolization. 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 This area correlated with the CTA. Electronically Signed   By: CHRISTELLA.  Shick M.D.   On: 05/08/2024 13:57   IR US  Guide Vasc Access Right Result Date: 05/08/2024 INDICATION: Acute right colon lower GI bleeding by CTA, associated blood-loss anemia, requiring volume resuscitation and transfusion EXAM: ULTRASOUND GUIDANCE FOR VASCULAR ACCESS SMA, RIGHT COLIC BRANCH, MARGINAL BRANCH, AND 3 SEPARATE VASA RECTA BRANCHES CATHETERIZATIONS, ANGIOGRAMS, AND PERIPHERAL MICRO COIL EMBOLIZATION MEDICATIONS: 1% lidocaine  local. ANESTHESIA/SEDATION: MAC provided by the anesthesia service CONTRAST:  130 cc omni 300 FLUOROSCOPY: Radiation Exposure Index (as provided by the fluoroscopic device): 1,509 mGy Kerma COMPLICATIONS: None immediate. PROCEDURE: Informed consent was obtained from the patient following explanation of the procedure, risks, benefits and alternatives. The patient understands, agrees and consents for the procedure. All questions were  addressed. A time out was performed prior to the initiation of the procedure. Maximal barrier sterile technique utilized including caps, mask, sterile gowns, sterile gloves, large sterile drape, hand hygiene, and Betadine  prep. Previous imaging reviewed. Under sterile conditions and local anesthesia, ultrasound micropuncture access performed of the patent right common femoral artery. Images obtained for documentation of the patent right common femoral artery. Five French sheath inserted over a Bentson guidewire. C2 catheter advanced initially utilized to select the SMA origin. SMA angiogram performed. SMA: SMA trunk and its branches are patent. Prominent right colic artery noted where there is active contrast extravasation into the hepatic flexure of the colon which correlates with the positive CTA findings. Progreat angled tip microcatheter was advanced through the C2  catheter into the SMA trunk. Fathom guidewire was utilized to select the right colic branch. Selective right colic angiogram performed. Right colic: Right colic branches widely patent as well as the marginal and Vasa recta branches to the abnormal hepatic flexure. In the hepatic flexure, there is abnormal hypervascularity, tortuosity, and slow arterial oozing in the area of active bleeding. There are prominent early draining veins. 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 Microcatheter advanced into a supplying Vasa recta branch. Selective Vasa recta angiography performed. Right Vasa recta branch (1): Tortuous abnormal hypervascularity and blushing noted with early draining vein compatible with a supplying branch to the area of angio dysplasia. Catheter access point is safe for peripheral micro coil embolization. There was successful deployment of terumo 2 mm microcoils within the branch ranging in size from 4 cm to 2 cm. After deployment of the microcoils, this peripheral Vasa recta branch is occluded. Repeat injection through the  microcatheter demonstrates additional peripheral branch supply to the abnormal vascularity. Microcatheter was retracted and utilized to select a second right vasa recta branch more superiorly. Right Vasa recta branch (2): This branch also supplies the tortuous abnormal hypervascular area with blushing and early draining veins. Catheter access location is safe for micro coil embolization. Additional 2 mm microcoils were successfully deployed in this second branch. Following micro coil embolization, repeat angiography confirms significant reduction in the Vasa recta supply to the angio dysplasia. Repeat injection of the marginal artery demonstrates a third superior branch with abnormal vascular supply. This branch was also successfully accessed with the microcatheter peripherally. Right Vasa recta branch (3): This branch also supplies the abnormal vascularity with early draining veins. Peripheral access is also safe for micro coil embolization. This branch was occluded with 2 mm and 3 mm microcoils successfully. Final post embolization angiogram through the marginal artery demonstrates significant reduction in the arterial supply to the area of presumed angio dysplasia. There is overall preserved marginal artery supply to the right colon. No additional large Vasa recta branch visualized supplying the abnormal vascularity. At this point the procedure was terminated. Microcatheter and C2 catheter both removed. Hemostasis obtained at the right CFA access site with the CELT device successfully. Patient tolerated the procedure well. There were no immediate complications. There was improvement in the patient's hemodynamics following the embolization. 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 This area correlated with the CTA. Electronically Signed   By: CHRISTELLA.  Shick M.D.    On: 05/08/2024 13:57   IR Angiogram Selective Each Additional Vessel Result Date: 05/08/2024 INDICATION: Acute right colon lower GI bleeding by CTA, associated blood-loss anemia, requiring volume resuscitation and transfusion EXAM: ULTRASOUND GUIDANCE FOR VASCULAR ACCESS SMA, RIGHT COLIC BRANCH, MARGINAL BRANCH, AND 3 SEPARATE VASA RECTA BRANCHES CATHETERIZATIONS, ANGIOGRAMS, AND PERIPHERAL MICRO COIL EMBOLIZATION MEDICATIONS: 1% lidocaine  local. ANESTHESIA/SEDATION: MAC provided by the anesthesia service CONTRAST:  130 cc omni 300 FLUOROSCOPY: Radiation Exposure Index (as provided by the fluoroscopic device): 1,509 mGy Kerma COMPLICATIONS: None immediate. PROCEDURE: Informed consent was obtained from the patient following explanation of the procedure, risks, benefits and alternatives. The patient understands, agrees and consents for the procedure. All questions were addressed. A time out was performed prior to the initiation of the procedure. Maximal barrier sterile technique utilized including caps, mask, sterile gowns, sterile gloves, large sterile drape, hand hygiene, and Betadine  prep. Previous imaging reviewed. Under sterile conditions  and local anesthesia, ultrasound micropuncture access performed of the patent right common femoral artery. Images obtained for documentation of the patent right common femoral artery. Five French sheath inserted over a Bentson guidewire. C2 catheter advanced initially utilized to select the SMA origin. SMA angiogram performed. SMA: SMA trunk and its branches are patent. Prominent right colic artery noted where there is active contrast extravasation into the hepatic flexure of the colon which correlates with the positive CTA findings. Progreat angled tip microcatheter was advanced through the C2 catheter into the SMA trunk. Fathom guidewire was utilized to select the right colic branch. Selective right colic angiogram performed. Right colic: Right colic branches widely patent  as well as the marginal and Vasa recta branches to the abnormal hepatic flexure. In the hepatic flexure, there is abnormal hypervascularity, tortuosity, and slow arterial oozing in the area of active bleeding. There are prominent early draining veins. 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 Microcatheter advanced into a supplying Vasa recta branch. Selective Vasa recta angiography performed. Right Vasa recta branch (1): Tortuous abnormal hypervascularity and blushing noted with early draining vein compatible with a supplying branch to the area of angio dysplasia. Catheter access point is safe for peripheral micro coil embolization. There was successful deployment of terumo 2 mm microcoils within the branch ranging in size from 4 cm to 2 cm. After deployment of the microcoils, this peripheral Vasa recta branch is occluded. Repeat injection through the microcatheter demonstrates additional peripheral branch supply to the abnormal vascularity. Microcatheter was retracted and utilized to select a second right vasa recta branch more superiorly. Right Vasa recta branch (2): This branch also supplies the tortuous abnormal hypervascular area with blushing and early draining veins. Catheter access location is safe for micro coil embolization. Additional 2 mm microcoils were successfully deployed in this second branch. Following micro coil embolization, repeat angiography confirms significant reduction in the Vasa recta supply to the angio dysplasia. Repeat injection of the marginal artery demonstrates a third superior branch with abnormal vascular supply. This branch was also successfully accessed with the microcatheter peripherally. Right Vasa recta branch (3): This branch also supplies the abnormal vascularity with early draining veins. Peripheral access is also safe for micro coil embolization. This branch was occluded with 2 mm and 3 mm microcoils successfully. Final post embolization angiogram through  the marginal artery demonstrates significant reduction in the arterial supply to the area of presumed angio dysplasia. There is overall preserved marginal artery supply to the right colon. No additional large Vasa recta branch visualized supplying the abnormal vascularity. At this point the procedure was terminated. Microcatheter and C2 catheter both removed. Hemostasis obtained at the right CFA access site with the CELT device successfully. Patient tolerated the procedure well. There were no immediate complications. There was improvement in the patient's hemodynamics following the embolization. 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 This area correlated with the CTA. Electronically Signed   By: CHRISTELLA.  Shick M.D.   On: 05/08/2024 13:57   IR Angiogram Selective Each Additional Vessel Result Date: 05/08/2024 INDICATION: Acute right colon lower GI bleeding by CTA, associated blood-loss anemia, requiring volume resuscitation and transfusion EXAM: ULTRASOUND GUIDANCE FOR VASCULAR ACCESS SMA, RIGHT COLIC BRANCH, MARGINAL BRANCH, AND 3 SEPARATE VASA RECTA BRANCHES CATHETERIZATIONS, ANGIOGRAMS, AND PERIPHERAL MICRO COIL EMBOLIZATION MEDICATIONS: 1% lidocaine  local. ANESTHESIA/SEDATION: MAC provided by the anesthesia service CONTRAST:  130 cc omni 300 FLUOROSCOPY: Radiation Exposure Index (as provided by the fluoroscopic device): 1,509 mGy Kerma COMPLICATIONS: None immediate. PROCEDURE: Informed consent was obtained from the patient following explanation of the procedure, risks, benefits and alternatives. The patient understands, agrees and consents for the procedure. All questions were addressed. A time out was performed prior to the initiation of the procedure. Maximal barrier sterile technique utilized including caps, mask, sterile gowns, sterile gloves, large  sterile drape, hand hygiene, and Betadine  prep. Previous imaging reviewed. Under sterile conditions and local anesthesia, ultrasound micropuncture access performed of the patent right common femoral artery. Images obtained for documentation of the patent right common femoral artery. Five French sheath inserted over a Bentson guidewire. C2 catheter advanced initially utilized to select the SMA origin. SMA angiogram performed. SMA: SMA trunk and its branches are patent. Prominent right colic artery noted where there is active contrast extravasation into the hepatic flexure of the colon which correlates with the positive CTA findings. Progreat angled tip microcatheter was advanced through the C2 catheter into the SMA trunk. Fathom guidewire was utilized to select the right colic branch. Selective right colic angiogram performed. Right colic: Right colic branches widely patent as well as the marginal and Vasa recta branches to the abnormal hepatic flexure. In the hepatic flexure, there is abnormal hypervascularity, tortuosity, and slow arterial oozing in the area of active bleeding. There are prominent early draining veins. 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 Microcatheter advanced into a supplying Vasa recta branch. Selective Vasa recta angiography performed. Right Vasa recta branch (1): Tortuous abnormal hypervascularity and blushing noted with early draining vein compatible with a supplying branch to the area of angio dysplasia. Catheter access point is safe for peripheral micro coil embolization. There was successful deployment of terumo 2 mm microcoils within the branch ranging in size from 4 cm to 2 cm. After deployment of the microcoils, this peripheral Vasa recta branch is occluded. Repeat injection through the microcatheter demonstrates additional peripheral branch supply to the abnormal vascularity. Microcatheter was retracted and utilized to select a second right vasa recta branch more  superiorly. Right Vasa recta branch (2): This branch also supplies the tortuous abnormal hypervascular area with blushing and early draining veins. Catheter access location is safe for micro coil embolization. Additional 2 mm microcoils were successfully deployed in this second branch. Following micro coil embolization, repeat angiography confirms significant reduction in the Vasa recta supply to the angio dysplasia. Repeat injection of the marginal artery demonstrates a third superior branch with abnormal vascular supply. This branch was also successfully accessed with the microcatheter peripherally. Right Vasa recta branch (3): This branch also supplies the abnormal vascularity with early draining veins. Peripheral access is also safe for micro coil embolization. This branch was occluded with 2 mm and 3 mm microcoils successfully. Final post embolization angiogram through the marginal artery demonstrates significant reduction in the arterial supply to the area of presumed angio dysplasia. There is overall preserved marginal artery supply to the right colon. No additional large Vasa recta branch visualized supplying the abnormal vascularity. At this point the procedure was terminated. Microcatheter and C2 catheter both removed. Hemostasis obtained at the right CFA access site with the CELT device successfully. Patient tolerated the procedure well. There were no immediate complications. There was improvement in the patient's hemodynamics following the embolization. 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 This area correlated with the CTA. Electronically Signed   By: CHRISTELLA.  Shick M.D.   On: 05/08/2024 13:57   IR EMBO ART  VEN HEMORR LYMPH EXTRAV  INC GUIDE ROADMAPPING Result Date: 05/08/2024 INDICATION: Acute right colon lower GI bleeding by CTA, associated  blood-loss anemia, requiring volume resuscitation and transfusion EXAM: ULTRASOUND GUIDANCE FOR VASCULAR ACCESS SMA, RIGHT COLIC BRANCH, MARGINAL BRANCH, AND 3 SEPARATE VASA RECTA BRANCHES CATHETERIZATIONS, ANGIOGRAMS, AND PERIPHERAL MICRO COIL EMBOLIZATION MEDICATIONS: 1% lidocaine  local. ANESTHESIA/SEDATION: MAC provided by the anesthesia service CONTRAST:  130 cc omni 300 FLUOROSCOPY: Radiation Exposure Index (as provided by the fluoroscopic device): 1,509 mGy Kerma COMPLICATIONS: None immediate. PROCEDURE: Informed consent was obtained from the patient following explanation of the procedure, risks, benefits and alternatives. The patient understands, agrees and consents for the procedure. All questions were addressed. A time out was performed prior to the initiation of the procedure. Maximal barrier sterile technique utilized including caps, mask, sterile gowns, sterile gloves, large sterile drape, hand hygiene, and Betadine  prep. Previous imaging reviewed. Under sterile conditions and local anesthesia, ultrasound micropuncture access performed of the patent right common femoral artery. Images obtained for documentation of the patent right common femoral artery. Five French sheath inserted over a Bentson guidewire. C2 catheter advanced initially utilized to select the SMA origin. SMA angiogram performed. SMA: SMA trunk and its branches are patent. Prominent right colic artery noted where there is active contrast extravasation into the hepatic flexure of the colon which correlates with the positive CTA findings. Progreat angled tip microcatheter was advanced through the C2 catheter into the SMA trunk. Fathom guidewire was utilized to select the right colic branch. Selective right colic angiogram performed. Right colic: Right colic branches widely patent as well as the marginal and Vasa recta branches to the abnormal hepatic flexure. In the hepatic flexure, there is abnormal hypervascularity, tortuosity, and slow  arterial oozing in the area of active bleeding. There are prominent early draining veins. 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 Microcatheter advanced into a supplying Vasa recta branch. Selective Vasa recta angiography performed. Right Vasa recta branch (1): Tortuous abnormal hypervascularity and blushing noted with early draining vein compatible with a supplying branch to the area of angio dysplasia. Catheter access point is safe for peripheral micro coil embolization. There was successful deployment of terumo 2 mm microcoils within the branch ranging in size from 4 cm to 2 cm. After deployment of the microcoils, this peripheral Vasa recta branch is occluded. Repeat injection through the microcatheter demonstrates additional peripheral branch supply to the abnormal vascularity. Microcatheter was retracted and utilized to select a second right vasa recta branch more superiorly. Right Vasa recta branch (2): This branch also supplies the tortuous abnormal hypervascular area with blushing and early draining veins. Catheter access location is safe for micro coil embolization. Additional 2 mm microcoils were successfully deployed in this second branch. Following micro coil embolization, repeat angiography confirms significant reduction in the Vasa recta supply to the angio dysplasia. Repeat injection of the marginal artery demonstrates a third superior branch with abnormal vascular supply. This branch was also successfully accessed with the microcatheter peripherally. Right Vasa recta branch (3): This branch also supplies the abnormal vascularity with early draining veins. Peripheral access is also safe for micro coil embolization. This branch was occluded with 2 mm and 3 mm microcoils successfully. Final post embolization angiogram through the marginal artery demonstrates significant reduction in the arterial supply to the area of presumed angio dysplasia. There is  overall preserved marginal artery  supply to the right colon. No additional large Vasa recta branch visualized supplying the abnormal vascularity. At this point the procedure was terminated. Microcatheter and C2 catheter both removed. Hemostasis obtained at the right CFA access site with the CELT device successfully. Patient tolerated the procedure well. There were no immediate complications. There was improvement in the patient's hemodynamics following the embolization. 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 This area correlated with the CTA. Electronically Signed   By: CHRISTELLA.  Shick M.D.   On: 05/08/2024 13:57   IR Angiogram Selective Each Additional Vessel Result Date: 05/08/2024 INDICATION: Acute right colon lower GI bleeding by CTA, associated blood-loss anemia, requiring volume resuscitation and transfusion EXAM: ULTRASOUND GUIDANCE FOR VASCULAR ACCESS SMA, RIGHT COLIC BRANCH, MARGINAL BRANCH, AND 3 SEPARATE VASA RECTA BRANCHES CATHETERIZATIONS, ANGIOGRAMS, AND PERIPHERAL MICRO COIL EMBOLIZATION MEDICATIONS: 1% lidocaine  local. ANESTHESIA/SEDATION: MAC provided by the anesthesia service CONTRAST:  130 cc omni 300 FLUOROSCOPY: Radiation Exposure Index (as provided by the fluoroscopic device): 1,509 mGy Kerma COMPLICATIONS: None immediate. PROCEDURE: Informed consent was obtained from the patient following explanation of the procedure, risks, benefits and alternatives. The patient understands, agrees and consents for the procedure. All questions were addressed. A time out was performed prior to the initiation of the procedure. Maximal barrier sterile technique utilized including caps, mask, sterile gowns, sterile gloves, large sterile drape, hand hygiene, and Betadine  prep. Previous imaging reviewed. Under sterile conditions and local anesthesia, ultrasound micropuncture access performed of  the patent right common femoral artery. Images obtained for documentation of the patent right common femoral artery. Five French sheath inserted over a Bentson guidewire. C2 catheter advanced initially utilized to select the SMA origin. SMA angiogram performed. SMA: SMA trunk and its branches are patent. Prominent right colic artery noted where there is active contrast extravasation into the hepatic flexure of the colon which correlates with the positive CTA findings. Progreat angled tip microcatheter was advanced through the C2 catheter into the SMA trunk. Fathom guidewire was utilized to select the right colic branch. Selective right colic angiogram performed. Right colic: Right colic branches widely patent as well as the marginal and Vasa recta branches to the abnormal hepatic flexure. In the hepatic flexure, there is abnormal hypervascularity, tortuosity, and slow arterial oozing in the area of active bleeding. There are prominent early draining veins. 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 Microcatheter advanced into a supplying Vasa recta branch. Selective Vasa recta angiography performed. Right Vasa recta branch (1): Tortuous abnormal hypervascularity and blushing noted with early draining vein compatible with a supplying branch to the area of angio dysplasia. Catheter access point is safe for peripheral micro coil embolization. There was successful deployment of terumo 2 mm microcoils within the branch ranging in size from 4 cm to 2 cm. After deployment of the microcoils, this peripheral Vasa recta branch is occluded. Repeat injection through the microcatheter demonstrates additional peripheral branch supply to the abnormal vascularity. Microcatheter was retracted and utilized to select a second right vasa recta branch more superiorly. Right Vasa recta branch (2): This branch also supplies the tortuous abnormal hypervascular area with blushing and early draining veins. Catheter access  location is safe for micro coil embolization. Additional 2 mm microcoils were successfully deployed in this second branch. Following micro coil embolization, repeat angiography confirms significant reduction in the Vasa recta supply to  the angio dysplasia. Repeat injection of the marginal artery demonstrates a third superior branch with abnormal vascular supply. This branch was also successfully accessed with the microcatheter peripherally. Right Vasa recta branch (3): This branch also supplies the abnormal vascularity with early draining veins. Peripheral access is also safe for micro coil embolization. This branch was occluded with 2 mm and 3 mm microcoils successfully. Final post embolization angiogram through the marginal artery demonstrates significant reduction in the arterial supply to the area of presumed angio dysplasia. There is overall preserved marginal artery supply to the right colon. No additional large Vasa recta branch visualized supplying the abnormal vascularity. At this point the procedure was terminated. Microcatheter and C2 catheter both removed. Hemostasis obtained at the right CFA access site with the CELT device successfully. Patient tolerated the procedure well. There were no immediate complications. There was improvement in the patient's hemodynamics following the embolization. 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 This area correlated with the CTA. Electronically Signed   By: CHRISTELLA.  Shick M.D.   On: 05/08/2024 13:57    ROS:  As stated above in the HPI otherwise negative.  Blood pressure (!) 134/46, pulse 88, temperature 98.6 F (37 C), temperature source Axillary, resp. rate 16, height 5' 5 (1.651 m), weight 88.5 kg, SpO2 98%.    PE: Gen: NAD, sleeping soundly, Alert and Oriented Lungs: CTA Bilaterally CV: RRR without  M/G/R ABD: Soft, NTND, +BS Ext: No C/C/E  Assessment/Plan: 1) ? New ischemic colitis in the ascending colon. 2) Anemia. 3) History of gastric ulcers. 4) History of duodenal AVMs.   No further reports of bleeding at this time.  Her abdomen is obese and soft.  She does not have any pain, but she also received pain medication.  From the GI standpoint there is no need for any intervention.  It is not clear if she may need to have surgical resection.    Plan: 1) Continue supportive care. 2) No Gi intervention at this time.  Call if her clinical status changes.  Donta Mcinroy D 05/09/2024, 3:42 PM        [1]  Allergies Allergen Reactions   Januvia  [Sitagliptin ] Other (See Comments)    Caused pancreatitis   Amlodipine  Swelling

## 2024-05-09 NOTE — Progress Notes (Addendum)
 "  NAME:  Casey Johnston, MRN:  [id number], DOB:  Oct 17, 1960, LOS: 3 ADMISSION DATE:  05/06/2024, CONSULTATION DATE:  05/08/24 REFERRING MD:  TRH, CHIEF COMPLAINT: Active GI bleed  History of Present Illness:  Casey Johnston is a 63 year old female with a past medical history significant for type 2 diabetes, HTN, HLD, OSA on no longer utilizing CPAP, GERD and anxiety who presented to the ED at [facility] 12/17 for complaints of shortness of breath and hematochezia.  Of note patient has history of gastric and small bowel AVMs with active GI bleed in the past, recent colonoscopy with diverticular disease.  Patient was admitted per hospitalist.  Patient seen with active bleed overnight 12/19 with resultant hypotension and tachycardia resulting in transfer to ICU for close monitoring  Pertinent  Medical History  Type 2 diabetes, HTN, HLD, OSA on no longer utilizing CPAP, GERD and anxiety  Significant Hospital Events: Including procedures, antibiotic start and stop dates in addition to other pertinent events   12/17 presented to [name] ED with hematochezia and shortness of breath 12/18 CT angio with active contrast extravasation involving the splenic flexure of the colon consistent with active GI bleed > interventional radiology consulted.  Unfortunately patient remains at [facility] and was unable to undergo IR evaluation.  Patient arrived to [phone] at 1913 12/19 ongoing GI bleed overnight with hypotension and tachycardia.  Underwent embolization of right colic branches  Interim History / Subjective:   Softer blood pressure today morning.  Had an episode of medium sized bowel movement with bright red blood.  Hemoglobin down to 6.4.  Objective    Blood pressure (!) 113/42, pulse 87, temperature 98.8 F (37.1 C), temperature source Axillary, resp. rate 15, height 5' 5 (1.651 m), weight 88.5 kg, SpO2 92%.        Intake/Output Summary (Last 24 hours) at 05/09/2024 0828 Last data filed  at 05/08/2024 1020 Gross per 24 hour  Intake 315 ml  Output --  Net 315 ml   Filed Weights   05/06/24 1132  Weight: 88.5 kg    Examination: Awake, oriented Mid abdominal tenderness. Lungs are clear to auscultation.  Heart rate regular rate and rhythm.  Lab/imaging reviewed Hemoglobin 6.4  Resolved problem list  Acute kidney injury  Assessment and Plan  Right colon angiodysplasia Active lower GI bleed s/p embolization by IR P: Continues to have active bleed, concern for ischemia Will repeat CT angiogram.  Transfuse 2 units PRBC.  500 cc LR bolus Discussed with IR Dr. Vanice.  There is limited utility of repeat embolization Will get GI and surgery involved as well Keep n.p.o. 2 large-bore IV  Acute blood loss anemia superimposed on anemia of chronic disease P: Transfuse per protocol Hemoglobin goal greater than 7 Treat underlying cause of bleeding as above  Type 2 diabetes P: SSI CBG goal 140-180 CBG checks every 4  Mild hyperkalemia P: Trend   Leukocytosis P: Trend CBC and fever curve  Critical care time:    The patient is critically ill with multiple organ system failure and requires high complexity decision making for assessment and support, frequent evaluation and titration of therapies, advanced monitoring, review of radiographic studies and interpretation of complex data.   Critical Care Time devoted to patient care services, exclusive of separately billable procedures, described in this note is 35 minutes.   Kayren Holck MD [facility] See Amion for pager  If no response to pager , please call [phone]  [phone] until 7pm After 7:00 pm call Elink  [phone] 05/09/2024, 8:34 AM          "

## 2024-05-10 DIAGNOSIS — E871 Hypo-osmolality and hyponatremia: Secondary | ICD-10-CM

## 2024-05-10 LAB — TYPE AND SCREEN
ABO/RH(D): O POS
Antibody Screen: NEGATIVE
Unit division: 0
Unit division: 0
Unit division: 0
Unit division: 0
Unit division: 0

## 2024-05-10 LAB — CBC
HCT: 18.4 % — ABNORMAL LOW (ref 36.0–46.0)
Hemoglobin: 6.3 g/dL — CL (ref 12.0–15.0)
MCH: 30 pg (ref 26.0–34.0)
MCHC: 34.2 g/dL (ref 30.0–36.0)
MCV: 87.6 fL (ref 80.0–100.0)
Platelets: 89 K/uL — ABNORMAL LOW (ref 150–400)
RBC: 2.1 MIL/uL — ABNORMAL LOW (ref 3.87–5.11)
RDW: 15.4 % (ref 11.5–15.5)
WBC: 19.4 K/uL — ABNORMAL HIGH (ref 4.0–10.5)
nRBC: 0.6 % — ABNORMAL HIGH (ref 0.0–0.2)

## 2024-05-10 LAB — HEPATIC FUNCTION PANEL
ALT: 7 U/L (ref 0–44)
AST: 22 U/L (ref 15–41)
Albumin: 2.1 g/dL — ABNORMAL LOW (ref 3.5–5.0)
Alkaline Phosphatase: 41 U/L (ref 38–126)
Bilirubin, Direct: <0.1 mg/dL (ref 0.0–0.2)
Total Bilirubin: 0.3 mg/dL (ref 0.0–1.2)
Total Protein: 3.7 g/dL — ABNORMAL LOW (ref 6.5–8.1)

## 2024-05-10 LAB — BPAM RBC
Blood Product Expiration Date: 202601042359
Blood Product Expiration Date: 202601122359
Blood Product Expiration Date: 202601132359
Blood Product Expiration Date: 202601232359
Blood Product Expiration Date: 202601232359
ISSUE DATE / TIME: 202512171410
ISSUE DATE / TIME: 202512180656
ISSUE DATE / TIME: 202512181032
ISSUE DATE / TIME: 202512181756
Unit Type and Rh: 5100
Unit Type and Rh: 5100
Unit Type and Rh: 5100
Unit Type and Rh: 5100
Unit Type and Rh: 5100

## 2024-05-10 LAB — BASIC METABOLIC PANEL WITH GFR
Anion gap: 6 (ref 5–15)
BUN: 31 mg/dL — ABNORMAL HIGH (ref 8–23)
CO2: 21 mmol/L — ABNORMAL LOW (ref 22–32)
Calcium: 6.9 mg/dL — ABNORMAL LOW (ref 8.9–10.3)
Chloride: 104 mmol/L (ref 98–111)
Creatinine, Ser: 1.01 mg/dL — ABNORMAL HIGH (ref 0.44–1.00)
GFR, Estimated: >60 mL/min
Glucose, Bld: 164 mg/dL — ABNORMAL HIGH (ref 70–99)
Potassium: 4.7 mmol/L (ref 3.5–5.1)
Sodium: 131 mmol/L — ABNORMAL LOW (ref 135–145)

## 2024-05-10 LAB — MAGNESIUM: Magnesium: 1.5 mg/dL — ABNORMAL LOW (ref 1.7–2.4)

## 2024-05-10 LAB — HEMOGLOBIN AND HEMATOCRIT, BLOOD
HCT: 18.7 % — ABNORMAL LOW (ref 36.0–46.0)
HCT: 24.4 % — ABNORMAL LOW (ref 36.0–46.0)
HCT: 23 % — ABNORMAL LOW (ref 36.0–46.0)
Hemoglobin: 6.4 g/dL — CL (ref 12.0–15.0)
Hemoglobin: 8 g/dL — ABNORMAL LOW (ref 12.0–15.0)
Hemoglobin: 7.8 g/dL — ABNORMAL LOW (ref 12.0–15.0)

## 2024-05-10 LAB — GLUCOSE, CAPILLARY
Glucose-Capillary: 163 mg/dL — ABNORMAL HIGH (ref 70–99)
Glucose-Capillary: 170 mg/dL — ABNORMAL HIGH (ref 70–99)
Glucose-Capillary: 163 mg/dL — ABNORMAL HIGH (ref 70–99)
Glucose-Capillary: 165 mg/dL — ABNORMAL HIGH (ref 70–99)

## 2024-05-10 LAB — PREPARE RBC (CROSSMATCH)

## 2024-05-10 LAB — LACTATE DEHYDROGENASE: LDH: 284 U/L — ABNORMAL HIGH (ref 105–235)

## 2024-05-10 LAB — PHOSPHORUS: Phosphorus: 3.4 mg/dL (ref 2.5–4.6)

## 2024-05-10 MED ORDER — SODIUM CHLORIDE 0.9% IV SOLUTION: Freq: Once | INTRAVENOUS | Status: AC

## 2024-05-10 MED ORDER — MAGNESIUM SULFATE 2 GM/50ML IV SOLN
2.0000 g | Freq: Once | INTRAVENOUS | Status: AC
  Administered 2024-05-10: 2 g via INTRAVENOUS
  Filled 2024-05-10: qty 50

## 2024-05-10 NOTE — Progress Notes (Signed)
 "  NAME:  Casey Johnston, MRN:  [id number], DOB:  July 06, 1960, LOS: 4 ADMISSION DATE:  05/06/2024, CONSULTATION DATE:  05/08/24 REFERRING MD:  TRH, CHIEF COMPLAINT: Active GI bleed  History of Present Illness:  Casey Johnston is a 63 year old female with a past medical history significant for type 2 diabetes, HTN, HLD, OSA on no longer utilizing CPAP, GERD and anxiety who presented to the ED at [facility] 12/17 for complaints of shortness of breath and hematochezia.  Of note patient has history of gastric and small bowel AVMs with active GI bleed in the past, recent colonoscopy with diverticular disease.  Patient was admitted per hospitalist.  Patient seen with active bleed overnight 12/19 with resultant hypotension and tachycardia resulting in transfer to ICU for close monitoring  Pertinent  Medical History  Type 2 diabetes, HTN, HLD, OSA on no longer utilizing CPAP, GERD and anxiety  Significant Hospital Events: Including procedures, antibiotic start and stop dates in addition to other pertinent events   12/17 presented to [name] ED with hematochezia and shortness of breath 12/18 CT angio with active contrast extravasation involving the hepatic flexure of the colon consistent with active GI bleed > interventional radiology consulted.  Unfortunately patient remains at [facility] and was unable to undergo IR evaluation.  Patient arrived to [phone] at 1913 12/19 ongoing GI bleed overnight with hypotension and tachycardia.  Underwent embolization of right colic branches 12/20  Softer blood pressure today morning.  Had an episode of medium sized bowel movement with bright red blood.  Hemoglobin down to 6.4.  Given 2 units PRBC.  Surgery and GI consulted.  Repeat CTA done  Interim History / Subjective:   No more episodes of blood in the stool.  However hemoglobin is down to 6.3 again  Objective    Blood pressure (!) 120/38, pulse 85, temperature 98.5 F (36.9 C), temperature source Oral,  resp. rate 18, height 5' 5 (1.651 m), weight 88.5 kg, SpO2 98%.        Intake/Output Summary (Last 24 hours) at 05/10/2024 9177 Last data filed at 05/09/2024 1200 Gross per 24 hour  Intake 1293.59 ml  Output --  Net 1293.59 ml   Filed Weights   05/06/24 1132  Weight: 88.5 kg    Examination: Awake, oriented. Improved abdominal tenderness Lungs are clear to auscultation, heart rate regular rate and rhythm  Lab/imaging reviewed WBC 19.4, hemoglobin 6.3 Sodium 131, BUN/creatinine 31/1.01  Resolved problem list  Acute kidney injury  Assessment and Plan  Right colon angiodysplasia Active lower GI bleed s/p embolization by IR P: Repeat CT reviewed with no active bleed but has bowel wall thickening Keep in ICU.  Transfuse 1 more unit.  Monitor for ongoing bleed. Appreciate input from GI, surgery and IR.  Acute blood loss anemia superimposed on anemia of chronic disease P: Transfuse per protocol Hemoglobin goal greater than 7 Treat underlying cause of bleeding as above  Type 2 diabetes P: SSI CBG goal 140-180 CBG checks every 4  Hyponatremia Monitor.  Add salt tabs when cleared to take p.o.   Leukocytosis P: Trend CBC and fever curve  Critical care time:    The patient is critically ill with multiple organ system failure and requires high complexity decision making for assessment and support, frequent evaluation and titration of therapies, advanced monitoring, review of radiographic studies and interpretation of complex data.   Critical Care Time devoted to patient care services, exclusive of separately billable procedures, described in this note  is 35 minutes.   Muslima Toppins MD [facility] See Amion for pager  If no response to pager , please call [phone] until 7pm After 7:00 pm call Elink  [phone] 05/10/2024, 8:22 AM          "

## 2024-05-10 NOTE — Plan of Care (Signed)
" °  Problem: Education: Goal: Knowledge of General Education information will improve Description: Including pain rating scale, medication(s)/side effects and non-pharmacologic comfort measures Outcome: Progressing   Problem: Clinical Measurements: Goal: Ability to maintain clinical measurements within normal limits will improve Outcome: Progressing Goal: Will remain free from infection Outcome: Progressing Goal: Diagnostic test results will improve Outcome: Progressing Goal: Respiratory complications will improve Outcome: Progressing Goal: Cardiovascular complication will be avoided Outcome: Progressing   Problem: Activity: Goal: Risk for activity intolerance will decrease Outcome: Progressing   Problem: Nutrition: Goal: Adequate nutrition will be maintained Outcome: Progressing   Problem: Elimination: Goal: Will not experience complications related to bowel motility Outcome: Progressing Goal: Will not experience complications related to urinary retention Outcome: Progressing   Problem: Pain Managment: Goal: General experience of comfort will improve and/or be controlled Outcome: Progressing   Problem: Skin Integrity: Goal: Risk for impaired skin integrity will decrease Outcome: Progressing   Problem: Safety: Goal: Ability to remain free from injury will improve Outcome: Progressing   Problem: Skin Integrity: Goal: Risk for impaired skin integrity will decrease Outcome: Progressing   Problem: Nutritional: Goal: Maintenance of adequate nutrition will improve Outcome: Progressing Goal: Progress toward achieving an optimal weight will improve Outcome: Progressing   Problem: Tissue Perfusion: Goal: Adequacy of tissue perfusion will improve Outcome: Progressing   Problem: Education: Goal: Understanding of CV disease, CV risk reduction, and recovery process will improve Outcome: Progressing Goal: Individualized Educational Video(s) Outcome: Progressing    Problem: Cardiovascular: Goal: Ability to achieve and maintain adequate cardiovascular perfusion will improve Outcome: Progressing Goal: Vascular access site(s) Level 0-1 will be maintained Outcome: Progressing   "

## 2024-05-10 NOTE — Progress Notes (Signed)
 PCCM note  Repeat hemoglobin after 1 unit of blood 6.4.  She has not had a bloody bowel movement so it is unclear why her hemoglobin keeps dropping.  Will check LFTs, haptoglobin, LDH to make sure there is no hemolysis.  Lonna Coder MD [place] 05/10/2024, 2:09 PM

## 2024-05-10 NOTE — Progress Notes (Signed)
 eLink Physician-Brief Progress Note Patient Name: Casey Johnston DOB: [birth year] MRN: [id number]   Date of Service  05/10/2024  HPI/Events of Note  Hg 6.3, s/p coiling for LGIB  eICU Interventions  Transfuse 1u pRBC     Intervention Category Intermediate Interventions: Bleeding - evaluation and treatment with blood products  Durinda Buzzelli 05/10/2024, 6:58 AM

## 2024-05-10 NOTE — Progress Notes (Signed)
" °  2 Days Post-Op   Chief Complaint/Subjective: No BMs overnight, pain slightly improved  Objective: Vital signs in last 24 hours: Temp:  [97.6 F (36.4 C)-98.6 F (37 C)] 98.1 F (36.7 C) (12/21 0930) Pulse Rate:  [72-101] 79 (12/21 0930) Resp:  [7-24] 12 (12/21 0930) BP: (89-153)/(37-83) 125/45 (12/21 0930) SpO2:  [92 %-100 %] 92 % (12/21 0930) Last BM Date : 05/09/24 Intake/Output from previous day: 12/20 0701 - 12/21 0700 In: 1293.6 [Blood:796.4; IV Piggyback:497.2] Out: -   PE: Gen: NAD Resp: nonlabored Card: RRR Abd: soft, slight pain on palpation  Lab Results:  Recent Labs    05/09/24 1722 05/09/24 2313 05/10/24 0627  WBC 31.9*  --  19.4*  HGB 8.8* 7.2* 6.3*  HCT 25.5* 20.8* 18.4*  PLT 103*  --  89*   Recent Labs    05/09/24 0651 05/10/24 0627  NA 132* 131*  K 5.2* 4.7  CL 104 104  CO2 19* 21*  GLUCOSE 265* 164*  BUN 44* 31*  CREATININE 1.35* 1.01*  CALCIUM  7.1* 6.9*   No results for input(s): LABPROT, INR in the last 72 hours.    Component Value Date/Time   NA 131 (L) 05/10/2024 0627   K 4.7 05/10/2024 0627   CL 104 05/10/2024 0627   CO2 21 (L) 05/10/2024 0627   GLUCOSE 164 (H) 05/10/2024 0627   BUN 31 (H) 05/10/2024 0627   CREATININE 1.01 (H) 05/10/2024 0627   CREATININE 0.66 08/27/2016 1521   CALCIUM  6.9 (L) 05/10/2024 0627   PROT 7.2 05/06/2024 1147   ALBUMIN 3.9 05/06/2024 1147   AST 24 05/06/2024 1147   ALT 13 05/06/2024 1147   ALKPHOS 81 05/06/2024 1147   BILITOT 0.3 05/06/2024 1147   GFRNONAA >60 05/10/2024 0627   GFRAA >60 02/09/2020 1054    Assessment/Plan  s/p Procedures: RADIOLOGY WITH ANESTHESIA 05/08/2024  Lower GI bleed s/p right colon IR embolization. Follow up CT scan with inflammatory changes in the right colon, no pneumatosis, no guarding. Leukocytosis up to 30 now down to 19  FEN - clear liquids VTE - hold for bleeding issues, Hgb drop to 6.3 today, getting 1 unit pRBC ID - no issues Disposition - per  primary   LOS: 4 days   I reviewed last 24 h vitals and pain scores, last 48 h intake and output, last 24 h labs and trends, and last 24 h imaging results.  This care required moderate level of medical decision making.   Casey Johnston [facility] at [facility] 05/10/2024, 10:08 AM Please see Amion for pager number during day hours 7:00am-4:30pm or 7:00am -11:30am on weekends   "

## 2024-05-10 NOTE — Progress Notes (Signed)
 Pt having small blood clots and small amount of bright red blood leak from her rectum while lying in bed or ambulating to bedside commode. Dr. Theophilus and Dr. Stevie made aware.

## 2024-05-10 NOTE — Progress Notes (Addendum)
 2045: RN called to room by patient who reported that she was leaking blood from rectum again. A portion of the bed pad under the patient was saturated with bright red blood, and there was also blood between the patient's thighs.   2115: Patient requested to use BSC. Patient voided but also had another episode of bright red blood leaking from rectum along with several small eraser size clots.  Elink notified of the above episodes including the patients concern for what was occurring and her abdominal pain, 4/10.  2230: episode of bright red BM/leaking from rectum.  0245: episode of bright red BM/leaking from rectum.

## 2024-05-10 NOTE — Progress Notes (Signed)
 eLink Physician-Brief Progress Note Patient Name: ANNMARGARET DECAPRIO DOB: 29-Jun-1960 MRN: [id number]   Date of Service  05/10/2024  HPI/Events of Note  BRBPR has come back with small clots.  Hemoglobin has been relatively stable after a total of 2 units PRBC in the past 24 hours.  eICU Interventions  For now, will need to continue monitoring with frequent hemoglobin checks.  If she has hemodynamic disturbances or significant drop in hemoglobin, will need to perform repeat CT angiography and discussed with IR.   0352 -hemoglobin 6.9, transfuse 1 unit PRBC.  CT angiography of the abdomen/pelvis  0631 -hemoglobin 6.1 on repeat, unit of PRBC still pending.  Will obtain DIC panel and order additional unit.  CT angio consistent with active bleeding at the site of previous embolization.  Reached out to IR  0703 - Spoke with IR again, given concern for angiodysplasia, may benefit from surgical re-evaluation, day IR team to follow  Intervention Category Intermediate Interventions: Bleeding - evaluation and treatment with blood products  Remi Rester 05/10/2024, 9:47 PM

## 2024-05-10 NOTE — Progress Notes (Signed)
 Pharmacy Electrolyte Replacement  Recent Labs:  Recent Labs    05/10/24 0627  K 4.7  MG 1.5*  PHOS 3.4  CREATININE 1.01*    Low Critical Values (K </= 2.5, Phos </= 1, Mg </= 1) Present: None  Plan:  Mg 2 g IV x1  Delon Sax, PharmD, BCPS 1:01 PM

## 2024-05-11 ENCOUNTER — Inpatient Hospital Stay (HOSPITAL_COMMUNITY)

## 2024-05-11 ENCOUNTER — Encounter (HOSPITAL_COMMUNITY): Payer: Self-pay | Admitting: Radiology

## 2024-05-11 DIAGNOSIS — K922 Gastrointestinal hemorrhage, unspecified: Secondary | ICD-10-CM | POA: Diagnosis not present

## 2024-05-11 DIAGNOSIS — R109 Unspecified abdominal pain: Secondary | ICD-10-CM | POA: Diagnosis not present

## 2024-05-11 DIAGNOSIS — K633 Ulcer of intestine: Secondary | ICD-10-CM | POA: Diagnosis not present

## 2024-05-11 DIAGNOSIS — K5520 Angiodysplasia of colon without hemorrhage: Secondary | ICD-10-CM

## 2024-05-11 DIAGNOSIS — K5730 Diverticulosis of large intestine without perforation or abscess without bleeding: Secondary | ICD-10-CM | POA: Diagnosis not present

## 2024-05-11 DIAGNOSIS — D649 Anemia, unspecified: Secondary | ICD-10-CM

## 2024-05-11 DIAGNOSIS — K648 Other hemorrhoids: Secondary | ICD-10-CM | POA: Diagnosis not present

## 2024-05-11 LAB — BASIC METABOLIC PANEL WITH GFR
Anion gap: 4 — ABNORMAL LOW (ref 5–15)
BUN: 19 mg/dL (ref 8–23)
CO2: 25 mmol/L (ref 22–32)
Calcium: 7.2 mg/dL — ABNORMAL LOW (ref 8.9–10.3)
Chloride: 104 mmol/L (ref 98–111)
Creatinine, Ser: 0.79 mg/dL (ref 0.44–1.00)
GFR, Estimated: >60 mL/min
Glucose, Bld: 171 mg/dL — ABNORMAL HIGH (ref 70–99)
Potassium: 4.5 mmol/L (ref 3.5–5.1)
Sodium: 132 mmol/L — ABNORMAL LOW (ref 135–145)

## 2024-05-11 LAB — HEMOGLOBIN AND HEMATOCRIT, BLOOD
HCT: 20.1 % — ABNORMAL LOW (ref 36.0–46.0)
HCT: 26.4 % — ABNORMAL LOW (ref 36.0–46.0)
HCT: 27.6 % — ABNORMAL LOW (ref 36.0–46.0)
HCT: 23 % — ABNORMAL LOW (ref 36.0–46.0)
Hemoglobin: 6.9 g/dL — CL (ref 12.0–15.0)
Hemoglobin: 9 g/dL — ABNORMAL LOW (ref 12.0–15.0)
Hemoglobin: 9.6 g/dL — ABNORMAL LOW (ref 12.0–15.0)
Hemoglobin: 7.8 g/dL — ABNORMAL LOW (ref 12.0–15.0)

## 2024-05-11 LAB — TYPE AND SCREEN
ABO/RH(D): O POS
Antibody Screen: NEGATIVE
Unit division: 0
Unit division: 0
Unit division: 0
Unit division: 0
Unit division: 0
Unit division: 0
Unit division: 0

## 2024-05-11 LAB — CBC
HCT: 17.5 % — ABNORMAL LOW (ref 36.0–46.0)
Hemoglobin: 6.1 g/dL — CL (ref 12.0–15.0)
MCH: 29.8 pg (ref 26.0–34.0)
MCHC: 34.9 g/dL (ref 30.0–36.0)
MCV: 85.4 fL (ref 80.0–100.0)
Platelets: 85 K/uL — ABNORMAL LOW (ref 150–400)
RBC: 2.05 MIL/uL — ABNORMAL LOW (ref 3.87–5.11)
RDW: 16.3 % — ABNORMAL HIGH (ref 11.5–15.5)
WBC: 13.5 K/uL — ABNORMAL HIGH (ref 4.0–10.5)
nRBC: 0.9 % — ABNORMAL HIGH (ref 0.0–0.2)

## 2024-05-11 LAB — DIC (DISSEMINATED INTRAVASCULAR COAGULATION)PANEL
D-Dimer, Quant: <0.27 ug{FEU}/mL (ref 0.00–0.50)
Fibrinogen: 243 mg/dL (ref 210–475)
INR: 1 (ref 0.8–1.2)
Platelets: 88 K/uL — ABNORMAL LOW (ref 150–400)
Prothrombin Time: 14 s (ref 11.4–15.2)
Smear Review: NONE SEEN
aPTT: 24 s (ref 24–36)

## 2024-05-11 LAB — BPAM RBC
Blood Product Expiration Date: 202601142359
Blood Product Expiration Date: 202601142359
Blood Product Expiration Date: 202601152359
Blood Product Expiration Date: 202601152359
Blood Product Expiration Date: 202601152359
Blood Product Expiration Date: 202601152359
Blood Product Expiration Date: 202601152359
ISSUE DATE / TIME: 202512190254
ISSUE DATE / TIME: 202512190823
ISSUE DATE / TIME: 202512191005
ISSUE DATE / TIME: 202512200821
ISSUE DATE / TIME: 202512200821
ISSUE DATE / TIME: 202512210755
ISSUE DATE / TIME: 202512211433
Unit Type and Rh: 5100
Unit Type and Rh: 5100
Unit Type and Rh: 5100
Unit Type and Rh: 5100
Unit Type and Rh: 5100
Unit Type and Rh: 5100
Unit Type and Rh: 5100

## 2024-05-11 LAB — GLUCOSE, CAPILLARY
Glucose-Capillary: 163 mg/dL — ABNORMAL HIGH (ref 70–99)
Glucose-Capillary: 149 mg/dL — ABNORMAL HIGH (ref 70–99)
Glucose-Capillary: 137 mg/dL — ABNORMAL HIGH (ref 70–99)
Glucose-Capillary: 116 mg/dL — ABNORMAL HIGH (ref 70–99)

## 2024-05-11 LAB — PREPARE RBC (CROSSMATCH)

## 2024-05-11 LAB — MAGNESIUM: Magnesium: 2 mg/dL (ref 1.7–2.4)

## 2024-05-11 MED ORDER — SODIUM CHLORIDE 0.9% IV SOLUTION: Freq: Once | INTRAVENOUS | Status: DC

## 2024-05-11 MED ORDER — CALCIUM GLUCONATE-NACL 1-0.675 GM/50ML-% IV SOLN
1.0000 g | Freq: Once | INTRAVENOUS | Status: AC
  Administered 2024-05-11: 1000 mg via INTRAVENOUS
  Filled 2024-05-11: qty 50

## 2024-05-11 MED ORDER — DIPHENHYDRAMINE HCL 50 MG/ML IJ SOLN
INTRAMUSCULAR | Status: AC
  Filled 2024-05-11: qty 1

## 2024-05-11 MED ORDER — NA SULFATE-K SULFATE-MG SULF 17.5-3.13-1.6 GM/177ML PO SOLN
0.5000 | Freq: Once | ORAL | Status: AC
  Administered 2024-05-11: 177 mL via ORAL
  Filled 2024-05-11: qty 1

## 2024-05-11 MED ORDER — SODIUM CHLORIDE 0.9 % IV SOLN: INTRAVENOUS | Status: DC

## 2024-05-11 MED ORDER — DIPHENHYDRAMINE HCL 50 MG/ML IJ SOLN
12.5000 mg | Freq: Four times a day (QID) | INTRAMUSCULAR | Status: DC | PRN
  Administered 2024-05-11: 12.5 mg via INTRAVENOUS

## 2024-05-11 MED ORDER — IOHEXOL 350 MG/ML SOLN
75.0000 mL | Freq: Once | INTRAVENOUS | Status: AC | PRN
  Administered 2024-05-11: 75 mL via INTRAVENOUS

## 2024-05-11 MED FILL — Calcium Chloride Inj 10%: INTRAVENOUS | Qty: 2 | Status: AC

## 2024-05-11 NOTE — Progress Notes (Signed)
 Patient ID: Casey Johnston, female   DOB: 1960/11/13, 63 y.o.   MRN: [id number] 3 Days Post-Op    Subjective: Further LGIB, getting transfusion now ROS negative except as listed above. Objective: Vital signs in last 24 hours: Temp:  [97.8 F (36.6 C)-98.9 F (37.2 C)] 98.9 F (37.2 C) (12/22 0747) Pulse Rate:  [67-99] 99 (12/22 0830) Resp:  [8-21] 20 (12/22 0830) BP: (108-143)/(35-82) 127/54 (12/22 0830) SpO2:  [95 %-100 %] 100 % (12/22 0830) Last BM Date : 05/11/24  Intake/Output from previous day: 12/21 0701 - 12/22 0700 In: 791.3 [I.V.:32; Blood:709.3; IV Piggyback:50] Out: -  Intake/Output this shift: No intake/output data recorded.  General appearance: alert and cooperative Resp: clear to auscultation bilaterally GI: soft, NT  Lab Results: CBC  Recent Labs    05/10/24 0627 05/10/24 1341 05/11/24 0255 05/11/24 0538 05/11/24 0731  WBC 19.4*  --   --  13.5*  --   HGB 6.3*   < > 6.9* 6.1*  --   HCT 18.4*   < > 20.1* 17.5*  --   PLT 89*  --   --  85* 88*   < > = values in this interval not displayed.   BMET Recent Labs    05/10/24 0627 05/11/24 0538  NA 131* 132*  K 4.7 4.5  CL 104 104  CO2 21* 25  GLUCOSE 164* 171*  BUN 31* 19  CREATININE 1.01* 0.79  CALCIUM  6.9* 7.2*   PT/INR Recent Labs    05/11/24 0731  LABPROT 14.0  INR 1.0   ABG No results for input(s): PHART, HCO3 in the last 72 hours.  Invalid input(s): PCO2, PO2  Studies/Results: CT ANGIO GI BLEED Result Date: 05/11/2024 EXAM: CTA ABDOMEN AND PELVIS WITH CONTRAST 05/09/2024 TECHNIQUE: CTA images of the abdomen and pelvis with intravenous contrast. 75 mL iohexol  (OMNIPAQUE ) 350 MG/ML injection. Three-dimensional MIP/volume rendered formations were performed. Automated exposure control, iterative reconstruction, and/or weight based adjustment of the mA/kV was utilized to reduce the radiation dose to as low as reasonably achievable. COMPARISON: CT angiogram of the abdomen and  pelvis dated 05/09/2024. CLINICAL HISTORY: Lower GI bleed (Ped 0-17y). FINDINGS: VASCULATURE: GI BLEED: There is some active hemorrhage within the hepatic flexure seen on image 116 of series 5 and image 81 of coronal series 14. AORTA: No acute finding. No abdominal aortic aneurysm. No dissection. CELIAC TRUNK: No acute finding. No occlusion or significant stenosis. SUPERIOR MESENTERIC ARTERY: No acute finding. No occlusion or significant stenosis. INFERIOR MESENTERIC ARTERY: No acute finding. No occlusion or significant stenosis. RENAL ARTERIES: No acute finding. No occlusion or significant stenosis. ILIAC ARTERIES: No acute finding. No occlusion or significant stenosis. ABDOMEN/PELVIS: LOWER CHEST: There are streaky peribronchovascular opacities again demonstrated within the lower lobes bilaterally, slightly worse on the left, as before. LIVER: The liver is unremarkable. GALLBLADDER AND BILE DUCTS: There is vicarious excretion within the gallbladder. No biliary ductal dilatation. SPLEEN: The spleen is unremarkable. PANCREAS: The pancreas is unremarkable. ADRENAL GLANDS: Bilateral adrenal glands demonstrate no acute abnormality. KIDNEYS, URETERS AND BLADDER: There are a couple of simple cysts again seen arising from the right kidney and a small simple cyst also on the left. No stones in the kidneys or ureters. No hydronephrosis. No perinephric or periureteral stranding. Urinary bladder is unremarkable. GI AND BOWEL: There are embolization coils again demonstrated posterior to the hepatic flexure. There is some active hemorrhage within the hepatic flexure seen on image 116 of series 5 and image 81 of coronal  series 14. There are a few scattered colonic diverticula present. Stomach and duodenal sweep demonstrate no acute abnormality. There is no bowel obstruction. No abnormal bowel wall thickening or distension. REPRODUCTIVE: The patient is status post hysterectomy and bilateral salpingo-oophorectomy. PERITONEUM AND  RETROPERITONEUM: No ascites or free air. LYMPH NODES: No lymphadenopathy. BONES AND SOFT TISSUES: No acute abnormality of the bones. No acute soft tissue abnormality. IMPRESSION: 1. Active hemorrhage within the hepatic flexure. 2. Embolization coils posterior to the hepatic flexure. 3. Scattered colonic diverticula. 4. Streaky peribronchovascular opacities in the bilateral lower lobes, slightly worse on the left, similar to prior. Electronically signed by: Evalene Coho MD 05/11/2024 05:01 AM EST RP Workstation: [id number]   CT Angio Abd/Pel w/ and/or w/o Result Date: 05/09/2024 EXAM: CTA ABDOMEN AND PELVIS WITH AND WITHOUT CONTRAST 05/09/2024 09:40:06 AM TECHNIQUE: CTA images of the abdomen and pelvis with and without intravenous contrast. Three-dimensional MIP/volume rendered formations were performed. Automated exposure control, iterative reconstruction, and/or weight based adjustment of the mA/kV was utilized to reduce the radiation dose to as low as reasonably achievable. 75 mL of iohexol  (OMNIPAQUE ) 350 MG/ML injection was administered. COMPARISON: 05/07/2024. Subsegmental atelectasis identified within both lung bases. CLINICAL HISTORY: Lower GI bleed. FINDINGS: VASCULATURE: AORTA: Mild aortic atherosclerotic calcification. No acute finding. No abdominal aortic aneurysm. No dissection. CELIAC TRUNK: Status post coil embolization branches of the superior mesenteric artery branch supplying the right colon and hepatic flexure region. No acute finding. No occlusion or significant stenosis. SUPERIOR MESENTERIC ARTERY: Status post coil embolization branches of the superior mesenteric artery branch supplying the right colon and hepatic flexure region. No acute finding. No occlusion or significant stenosis. RENAL ARTERIES: No acute finding. No occlusion or significant stenosis. ILIAC ARTERIES: No acute finding. No occlusion or significant stenosis. LIVER: The liver is unremarkable. GALLBLADDER AND BILE DUCTS:  Contrast material fills the gallbladder lumen. No biliary ductal dilatation. SPLEEN: The spleen is unremarkable. PANCREAS: The pancreas is unremarkable. ADRENAL GLANDS: Bilateral adrenal glands demonstrate no acute abnormality. KIDNEYS, URETERS AND BLADDER: Bosniak class 2 right kidney cysts are unchanged. The largest arises off the lateral cortex of the right kidney measuring 4.3 cm, image 98/18. No stones in the kidneys or ureters. No hydronephrosis. No perinephric or periureteral stranding. Urinary bladder is unremarkable. GI AND BOWEL: Stomach and duodenal sweep demonstrate no acute abnormality. There is no bowel obstruction. There is no brisk arterial phase extravasation of contrast into the colonic lumen. There is new low attenuation wall thickening involving the ascending colon with hyperemia of the mucosa on the delayed images with trace amount of intraluminal contrast noted on the delayed images. The trace intraluminal contrast on delayed images could represent very slow or intermittent bleeding. Colonic diverticulosis without signs of acute diverticulitis. There are no signs of active intraluminal contrast extravasation to indicate recurrent GI bleeding. There is no abnormal bowel dilatation. No abnormal bowel wall thickening or distension in the remainder of the visualized bowel. REPRODUCTIVE: Status post hysterectomy. No adnexal mass. PERITONEUM AND RETRPERITONEUM: No ascites or free air. No free fluid or fluid collections identified. LUNG BASE: Subsegmental atelectasis identified within both lung bases. LYMPH NODES: No lymphadenopathy. BONES AND SOFT TISSUES: No acute abnormality of the bones. No acute soft tissue abnormality. IMPRESSION: 1. No evidence of active arterial phase  intraluminal contrast extravasation. 2. New low attenuation wall thickening involving the ascending colon with hyperemia of the mucosa and trace intraluminal contrast on delayed images in the setting of recent coil embolization  of branches of the  superior mesenteric artery supplying the right colon/hepatic flexure region; differential considerations include ischemic colitis, infectious colitis, or post-embolization changes. 3. Colonic diverticulosis without signs of acute diverticulitis. Electronically signed by: Waddell Calk MD 05/09/2024 10:07 AM EST RP Workstation: [id number]    Anti-infectives: Anti-infectives (From admission, onward)    None       Assessment/Plan: Lower GI bleed, s/p right colon IR embolization. Follow up CT scan with inflammatory changes in the right colon, no pneumatosis, no tenderness, WBC down to 13/5. Receiving PRBC. Doing prep for colonoscopy. Appreciate GI eval.  FEN - prep for colonoscopy VTE - hold for bleeding issues, Hgb drop to 6.1 today, getting transfusion  Disposition - transfusion and prep for colonoscopy. I D/W Dr. Theophilus. We will continue to follow.   LOS: 5 days    Dann Hummer, MD, MPH, FACS Trauma & General Surgery Use [email or web address] to contact on call provider  05/11/2024

## 2024-05-11 NOTE — Progress Notes (Signed)
.. ° ° °  PROCEDURAL EXPEDITER PROGRESS NOTE  Patient Name: Casey Johnston  DOB:03-Aug-1960 Date of Admission: 05/06/2024  Date of Assessment:05/11/2024   -------------------------------------------------------------------------------------------------------------------   Brief clinical summary: Pt to have colonscopy tomorrow  Orders in place:  Yes   Communication with surgical team if no orders: N/A  Labs, test, and orders reviewed: Y  Requires surgical clearance:  No  Barriers noted: N/A   Intervention provided by [facility] team: N/A  Barrier resolved:  not applicable   -------------------------------------------------------------------------------------------------------------------  [employer], [place], [place] Please contact us  directly via secure chat (search for [facility]) or by calling us  at [phone] [facility]).

## 2024-05-11 NOTE — Progress Notes (Signed)
 Alerted MD Mannam that on patient's third Post bowel prep trip to bathroom patient became nauseous/weak and had been experiencing large amount of bloody rectal discharge.  Orders were given for H&H lab.  RN put verbal order in stat.  Patient was brought back to bed and given compazine .

## 2024-05-11 NOTE — Progress Notes (Addendum)
 "   Inpatient Progress Note     Patient Profile/Chief Complaint  63 year old female with a PMH of gastric ulcers (Forrest III), duodenal ulcers (Forrest III), duodenal AVMs, HTN, hyperlipidemia, and GERD transferred from [facility] for IR embolization of source of bleeding source at hepatic flexure on CTA 05/07/2024  Status post IR embolization 05/08/2024 with 3 coils placed into SMA supplying area of right colon hepatic flexure angiodysplasia with mild arterial bleeding   Interval History   - Patient initially clinically stable after IR embolization 05/08/2024 - Developed recurrent bright red blood per rectum 05/10/2024 with decline in hemoglobin today to 6.1 - CTA this morning shows active hemorrhage within the hepatic flexure with embolization coils present - Patient denies abdominal pain, nausea or vomiting - Surgery following and suggests repeat colonoscopy   Objective   Vital signs in last 24 hours: Temp:  [97.8 F (36.6 C)-98.9 F (37.2 C)] 98.8 F (37.1 C) (12/22 1021) Pulse Rate:  [67-99] 67 (12/22 1130) Resp:  [8-21] 12 (12/22 1130) BP: (106-143)/(30-82) 121/43 (12/22 1130) SpO2:  [95 %-100 %] 99 % (12/22 1130) Last BM Date : 05/11/24 General:    Resting quietly on the edge of the bed in no distress Heart:  Regular rate and rhythm; no murmurs Lungs: Respirations even and unlabored, lungs CTA bilaterally Abdomen:  Soft, nontender and nondistended. Normal bowel sounds. Extremities:  Without edema. Neurologic:  Alert and oriented,  grossly normal neurologically. Psych:  Cooperative. Normal mood and affect.  Intake/Output from previous day: 12/21 0701 - 12/22 0700 In: 791.3 [I.V.:32; Blood:709.3; IV Piggyback:50] Out: -  Intake/Output this shift: Total I/O In: 315 [Blood:315] Out: -   Lab Results: Recent Labs    05/09/24 1722 05/09/24 2313 05/10/24 0627 05/10/24 1341 05/10/24 2129 05/11/24 0255 05/11/24 0538 05/11/24 0731  WBC 31.9*  --  19.4*  --   --    --  13.5*  --   HGB 8.8*   < > 6.3*   < > 7.8* 6.9* 6.1*  --   HCT 25.5*   < > 18.4*   < > 23.0* 20.1* 17.5*  --   PLT 103*  --  89*  --   --   --  85* 88*   < > = values in this interval not displayed.   BMET Recent Labs    05/09/24 0651 05/10/24 0627 05/11/24 0538  NA 132* 131* 132*  K 5.2* 4.7 4.5  CL 104 104 104  CO2 19* 21* 25  GLUCOSE 265* 164* 171*  BUN 44* 31* 19  CREATININE 1.35* 1.01* 0.79  CALCIUM  7.1* 6.9* 7.2*   LFT Recent Labs    05/10/24 1830  PROT 3.7*  ALBUMIN 2.1*  AST 22  ALT 7  ALKPHOS 41  BILITOT 0.3  BILIDIR <0.1  IBILI NOT CALCULATED   PT/INR Recent Labs    05/11/24 0731  LABPROT 14.0  INR 1.0    Studies/Results: CT ANGIO GI BLEED Result Date: 05/11/2024 EXAM: CTA ABDOMEN AND PELVIS WITH CONTRAST 05/09/2024 TECHNIQUE: CTA images of the abdomen and pelvis with intravenous contrast. 75 mL iohexol  (OMNIPAQUE ) 350 MG/ML injection. Three-dimensional MIP/volume rendered formations were performed. Automated exposure control, iterative reconstruction, and/or weight based adjustment of the mA/kV was utilized to reduce the radiation dose to as low as reasonably achievable. COMPARISON: CT angiogram of the abdomen and pelvis dated 05/09/2024. CLINICAL HISTORY: Lower GI bleed (Ped 0-17y). FINDINGS: VASCULATURE: GI BLEED: There is some active hemorrhage within the hepatic flexure  seen on image 116 of series 5 and image 81 of coronal series 14. AORTA: No acute finding. No abdominal aortic aneurysm. No dissection. CELIAC TRUNK: No acute finding. No occlusion or significant stenosis. SUPERIOR MESENTERIC ARTERY: No acute finding. No occlusion or significant stenosis. INFERIOR MESENTERIC ARTERY: No acute finding. No occlusion or significant stenosis. RENAL ARTERIES: No acute finding. No occlusion or significant stenosis. ILIAC ARTERIES: No acute finding. No occlusion or significant stenosis. ABDOMEN/PELVIS: LOWER CHEST: There are streaky peribronchovascular opacities  again demonstrated within the lower lobes bilaterally, slightly worse on the left, as before. LIVER: The liver is unremarkable. GALLBLADDER AND BILE DUCTS: There is vicarious excretion within the gallbladder. No biliary ductal dilatation. SPLEEN: The spleen is unremarkable. PANCREAS: The pancreas is unremarkable. ADRENAL GLANDS: Bilateral adrenal glands demonstrate no acute abnormality. KIDNEYS, URETERS AND BLADDER: There are a couple of simple cysts again seen arising from the right kidney and a small simple cyst also on the left. No stones in the kidneys or ureters. No hydronephrosis. No perinephric or periureteral stranding. Urinary bladder is unremarkable. GI AND BOWEL: There are embolization coils again demonstrated posterior to the hepatic flexure. There is some active hemorrhage within the hepatic flexure seen on image 116 of series 5 and image 81 of coronal series 14. There are a few scattered colonic diverticula present. Stomach and duodenal sweep demonstrate no acute abnormality. There is no bowel obstruction. No abnormal bowel wall thickening or distension. REPRODUCTIVE: The patient is status post hysterectomy and bilateral salpingo-oophorectomy. PERITONEUM AND RETROPERITONEUM: No ascites or free air. LYMPH NODES: No lymphadenopathy. BONES AND SOFT TISSUES: No acute abnormality of the bones. No acute soft tissue abnormality. IMPRESSION: 1. Active hemorrhage within the hepatic flexure. 2. Embolization coils posterior to the hepatic flexure. 3. Scattered colonic diverticula. 4. Streaky peribronchovascular opacities in the bilateral lower lobes, slightly worse on the left, similar to prior. Electronically signed by: Evalene Coho MD 05/11/2024 05:01 AM EST RP Workstation: [id number]    Endoscopic Studies: EGD/colonoscopy/small bowel endoscopy 03/2024 reviewed in epic   Clinical Impression   63 year old female with a PMH of gastric ulcers (Forrest III), duodenal ulcers (Forrest III), duodenal AVMs,  HTN, hyperlipidemia, and GERD transferred from [facility] for IR embolization of source of bleeding source at hepatic flexure on CTA 05/07/2024  Status post IR embolization 05/08/2024 with 3 coils placed into SMA supplying area of right colon hepatic flexure angiodysplasia with mild arterial bleeding.  After embolization of right colon, patient has evidence to ongoing bright red blood per rectum and anemia with hemoglobin 6.1 today.  CTA this morning shows active hemorrhage within the hepatic flexure as well as scattered colonic diverticula.  Bleeding may be due to angioectasia, ischemia secondary to recent embolization, diverticular.  Patient currently hemodynamically stable.  Denies abdominal pain.   Plan  Plan for colonoscopy 05/12/2024 Continue n.p.o. with IV fluids Orders will be placed for bowel preparation for colonoscopy tomorrow Monitor hemoglobin and hematocrit and transfuse as appropriate Surgical team following    LOS: 5 days   Casey Johnston  05/11/2024, 11:44 AM  Casey Hausen, MD [facility]  "

## 2024-05-11 NOTE — Progress Notes (Addendum)
 "  NAME:  Casey Johnston, MRN:  [id number], DOB:  1960/09/30, LOS: 5 ADMISSION DATE:  05/06/2024, CONSULTATION DATE:  05/08/24 REFERRING MD:  TRH, CHIEF COMPLAINT: Active GI bleed  History of Present Illness:  Casey Johnston is a 63 year old female with a past medical history significant for type 2 diabetes, HTN, HLD, OSA on no longer utilizing CPAP, GERD and anxiety who presented to the ED at [facility] 12/17 for complaints of shortness of breath and hematochezia.  Of note patient has history of gastric and small bowel AVMs with active GI bleed in the past, recent colonoscopy with diverticular disease.  Patient was admitted per hospitalist.  Patient seen with active bleed overnight 12/19 with resultant hypotension and tachycardia resulting in transfer to ICU for close monitoring  Pertinent  Medical History  Type 2 diabetes, HTN, HLD, OSA on no longer utilizing CPAP, GERD and anxiety  Significant Hospital Events: Including procedures, antibiotic start and stop dates in addition to other pertinent events   12/17 presented to [facility], ED with hematochezia and shortness of breath 12/18 CT angio with active contrast extravasation involving the hepatic flexure of the colon consistent with active GI bleed > interventional radiology consulted.  Unfortunately patient remains at [facility] and was unable to undergo IR evaluation.  Patient arrived to [phone] at 1913 12/19 ongoing GI bleed overnight with hypotension and tachycardia.  Underwent embolization of right colic branches 12/20  Softer blood pressure today morning.  Had an episode of medium sized bowel movement with bright red blood.  Hemoglobin down to 6.4.  Given 2 units PRBC.  Surgery and GI consulted.  Repeat CTA done  Interim History / Subjective:   Bleeding per rectum noted overnight.  CTA shows extravasation in the hepatic flexure.  IR contacted overnight but feels there is no role for repeat embolization.  Getting 2 units PRBC for  hemoglobin 6.1  Objective    Blood pressure (!) 135/37, pulse 71, temperature 98.9 F (37.2 C), temperature source Oral, resp. rate 10, height 5' 5 (1.651 m), weight 88.5 kg, SpO2 99%.        Intake/Output Summary (Last 24 hours) at 05/11/2024 9178 Last data filed at 05/10/2024 1800 Gross per 24 hour  Intake 791.33 ml  Output --  Net 791.33 ml   Filed Weights   05/06/24 1132  Weight: 88.5 kg    Examination: Awake, oriented Lungs are clear to auscultation Heart rate regular rate and rhythm Abdomen nontender  Lab/imaging reviewed Sodium 132, BUN/creatinine 19/0.79 WBC 13.5, hemoglobin 6.1, platelets 85   Resolved problem list  Acute kidney injury  Assessment and Plan  Right colon angiodysplasia Active lower GI bleed s/p embolization by IR P: Continues to active bleed from the hepatic flexure Transfuse 2 more units PRBC Will touch base with GI and general surgery for options regarding colonoscopy versus surgery.  Acute blood loss anemia superimposed on anemia of chronic disease P: Transfuse per protocol Hemoglobin goal greater than 7 Treat underlying cause of bleeding as above  Type 2 diabetes P: SSI CBG goal 140-180 CBG checks every 4  Hyponatremia Monitor.  Add salt tabs when cleared to take p.o.  Leukocytosis P: Trend CBC and fever curve  Critical care time:    The patient is critically ill with multiple organ system failure and requires high complexity decision making for assessment and support, frequent evaluation and titration of therapies, advanced monitoring, review of radiographic studies and interpretation of complex data.   Critical Care Time  devoted to patient care services, exclusive of separately billable procedures, described in this note is 35 minutes.   Arvin Abello MD [facility] See Amion for pager  If no response to pager , please call [phone] until 7pm After 7:00 pm call Elink   [phone] 05/11/2024, 8:30 AM         "

## 2024-05-11 NOTE — Telephone Encounter (Signed)
 Noted

## 2024-05-12 ENCOUNTER — Encounter (HOSPITAL_COMMUNITY): Payer: Self-pay | Admitting: Internal Medicine

## 2024-05-12 ENCOUNTER — Encounter (HOSPITAL_COMMUNITY): Admission: EM | Disposition: A | Payer: Self-pay | Source: Home / Self Care | Attending: Pulmonary Disease

## 2024-05-12 ENCOUNTER — Inpatient Hospital Stay (HOSPITAL_COMMUNITY): Admitting: Registered Nurse

## 2024-05-12 DIAGNOSIS — K648 Other hemorrhoids: Secondary | ICD-10-CM

## 2024-05-12 DIAGNOSIS — E119 Type 2 diabetes mellitus without complications: Secondary | ICD-10-CM | POA: Diagnosis not present

## 2024-05-12 DIAGNOSIS — F17210 Nicotine dependence, cigarettes, uncomplicated: Secondary | ICD-10-CM | POA: Diagnosis not present

## 2024-05-12 DIAGNOSIS — D696 Thrombocytopenia, unspecified: Secondary | ICD-10-CM

## 2024-05-12 DIAGNOSIS — K633 Ulcer of intestine: Secondary | ICD-10-CM

## 2024-05-12 DIAGNOSIS — K5730 Diverticulosis of large intestine without perforation or abscess without bleeding: Secondary | ICD-10-CM

## 2024-05-12 DIAGNOSIS — I10 Essential (primary) hypertension: Secondary | ICD-10-CM | POA: Diagnosis not present

## 2024-05-12 HISTORY — PX: HEMOSTASIS CLIP PLACEMENT: SHX6857

## 2024-05-12 HISTORY — PX: COLONOSCOPY: SHX5424

## 2024-05-12 LAB — PREPARE RBC (CROSSMATCH)

## 2024-05-12 LAB — HEMOGLOBIN AND HEMATOCRIT, BLOOD
HCT: 21 % — ABNORMAL LOW (ref 36.0–46.0)
HCT: 23.3 % — ABNORMAL LOW (ref 36.0–46.0)
HCT: 20.4 % — ABNORMAL LOW (ref 36.0–46.0)
HCT: 17.5 % — ABNORMAL LOW (ref 36.0–46.0)
HCT: 20.2 % — ABNORMAL LOW (ref 36.0–46.0)
Hemoglobin: 7.2 g/dL — ABNORMAL LOW (ref 12.0–15.0)
Hemoglobin: 8 g/dL — ABNORMAL LOW (ref 12.0–15.0)
Hemoglobin: 6.9 g/dL — CL (ref 12.0–15.0)
Hemoglobin: 5.9 g/dL — CL (ref 12.0–15.0)
Hemoglobin: 7 g/dL — ABNORMAL LOW (ref 12.0–15.0)

## 2024-05-12 LAB — HAPTOGLOBIN: Haptoglobin: 137 mg/dL (ref 37–355)

## 2024-05-12 LAB — GLUCOSE, CAPILLARY
Glucose-Capillary: 148 mg/dL — ABNORMAL HIGH (ref 70–99)
Glucose-Capillary: 141 mg/dL — ABNORMAL HIGH (ref 70–99)
Glucose-Capillary: 118 mg/dL — ABNORMAL HIGH (ref 70–99)
Glucose-Capillary: 237 mg/dL — ABNORMAL HIGH (ref 70–99)
Glucose-Capillary: 141 mg/dL — ABNORMAL HIGH (ref 70–99)

## 2024-05-12 MED ORDER — PHENYLEPHRINE HCL-NACL 20-0.9 MG/250ML-% IV SOLN
INTRAVENOUS | Status: DC | PRN
  Administered 2024-05-12: 50 ug/min via INTRAVENOUS

## 2024-05-12 MED ORDER — EPHEDRINE SULFATE-NACL 50-0.9 MG/10ML-% IV SOSY
PREFILLED_SYRINGE | INTRAVENOUS | Status: DC | PRN
  Administered 2024-05-12 (×2): 5 mg via INTRAVENOUS

## 2024-05-12 MED ORDER — THIAMINE MONONITRATE 100 MG PO TABS
100.0000 mg | ORAL_TABLET | Freq: Every day | ORAL | Status: AC
  Administered 2024-05-12 – 2024-05-18 (×6): 100 mg via ORAL
  Filled 2024-05-12 (×5): qty 1

## 2024-05-12 MED ORDER — PROPOFOL 10 MG/ML IV BOLUS
INTRAVENOUS | Status: DC | PRN
  Administered 2024-05-12: 30 mg via INTRAVENOUS
  Administered 2024-05-12: 20 mg via INTRAVENOUS

## 2024-05-12 MED ORDER — PHENYLEPHRINE HCL (PRESSORS) 10 MG/ML IV SOLN
INTRAVENOUS | Status: DC | PRN
  Administered 2024-05-12 (×4): 80 ug via INTRAVENOUS
  Administered 2024-05-12: 120 ug via INTRAVENOUS
  Administered 2024-05-12: 80 ug via INTRAVENOUS

## 2024-05-12 MED ORDER — PROPOFOL 500 MG/50ML IV EMUL
INTRAVENOUS | Status: DC | PRN
  Administered 2024-05-12: 150 ug/kg/min via INTRAVENOUS

## 2024-05-12 MED ORDER — SODIUM CHLORIDE 0.9% IV SOLUTION: Freq: Once | INTRAVENOUS | Status: DC

## 2024-05-12 MED ORDER — BOOST / RESOURCE BREEZE PO LIQD CUSTOM
1.0000 | Freq: Three times a day (TID) | ORAL | Status: DC
  Administered 2024-05-12 – 2024-05-21 (×10): 1 via ORAL
  Filled 2024-05-12 (×3): qty 1

## 2024-05-12 MED ORDER — ADULT MULTIVITAMIN W/MINERALS CH
1.0000 | ORAL_TABLET | Freq: Every day | ORAL | Status: AC
  Administered 2024-05-12 – 2024-05-24 (×11): 1 via ORAL
  Filled 2024-05-12 (×5): qty 1

## 2024-05-12 NOTE — Progress Notes (Signed)

## 2024-05-12 NOTE — Progress Notes (Signed)
 Patient ID: Casey Johnston, female   DOB: 09/14/1960, 63 y.o.   MRN: [id number] 4 Days Post-Op    Subjective: Further LGIB yesterday evening. H/H 8.0/23.3 from 9.6/27.6 post transfusion yesterday evening Objective: Vital signs in last 24 hours: Temp:  [97.7 F (36.5 C)-98.8 F (37.1 C)] 98.1 F (36.7 C) (12/23 0800) Pulse Rate:  [66-90] 78 (12/23 0800) Resp:  [11-21] 16 (12/23 0800) BP: (93-154)/(30-99) 121/54 (12/23 0800) SpO2:  [93 %-100 %] 100 % (12/23 0800) Last BM Date : 05/11/24  Intake/Output from previous day: 12/22 0701 - 12/23 0700 In: 744.3 [Blood:694.3; IV Piggyback:50] Out: -  Intake/Output this shift: No intake/output data recorded.  General appearance: alert and cooperative Resp: bilateral equal chest rise, normal work of breathing GI: soft, NT  Lab Results: CBC  Recent Labs    05/10/24 0627 05/10/24 1341 05/11/24 0538 05/11/24 0731 05/11/24 1253 05/12/24 0213 05/12/24 0622  WBC 19.4*  --  13.5*  --   --   --   --   HGB 6.3*   < > 6.1*  --    < > 7.2* 8.0*  HCT 18.4*   < > 17.5*  --    < > 21.0* 23.3*  PLT 89*  --  85* 88*  --   --   --    < > = values in this interval not displayed.   BMET Recent Labs    05/10/24 0627 05/11/24 0538  NA 131* 132*  K 4.7 4.5  CL 104 104  CO2 21* 25  GLUCOSE 164* 171*  BUN 31* 19  CREATININE 1.01* 0.79  CALCIUM  6.9* 7.2*   PT/INR Recent Labs    05/11/24 0731  LABPROT 14.0  INR 1.0   ABG No results for input(s): PHART, HCO3 in the last 72 hours.  Invalid input(s): PCO2, PO2  Studies/Results: CT ANGIO GI BLEED Result Date: 05/11/2024 EXAM: CTA ABDOMEN AND PELVIS WITH CONTRAST 05/09/2024 TECHNIQUE: CTA images of the abdomen and pelvis with intravenous contrast. 75 mL iohexol  (OMNIPAQUE ) 350 MG/ML injection. Three-dimensional MIP/volume rendered formations were performed. Automated exposure control, iterative reconstruction, and/or weight based adjustment of the mA/kV was utilized to reduce  the radiation dose to as low as reasonably achievable. COMPARISON: CT angiogram of the abdomen and pelvis dated 05/09/2024. CLINICAL HISTORY: Lower GI bleed (Ped 0-17y). FINDINGS: VASCULATURE: GI BLEED: There is some active hemorrhage within the hepatic flexure seen on image 116 of series 5 and image 81 of coronal series 14. AORTA: No acute finding. No abdominal aortic aneurysm. No dissection. CELIAC TRUNK: No acute finding. No occlusion or significant stenosis. SUPERIOR MESENTERIC ARTERY: No acute finding. No occlusion or significant stenosis. INFERIOR MESENTERIC ARTERY: No acute finding. No occlusion or significant stenosis. RENAL ARTERIES: No acute finding. No occlusion or significant stenosis. ILIAC ARTERIES: No acute finding. No occlusion or significant stenosis. ABDOMEN/PELVIS: LOWER CHEST: There are streaky peribronchovascular opacities again demonstrated within the lower lobes bilaterally, slightly worse on the left, as before. LIVER: The liver is unremarkable. GALLBLADDER AND BILE DUCTS: There is vicarious excretion within the gallbladder. No biliary ductal dilatation. SPLEEN: The spleen is unremarkable. PANCREAS: The pancreas is unremarkable. ADRENAL GLANDS: Bilateral adrenal glands demonstrate no acute abnormality. KIDNEYS, URETERS AND BLADDER: There are a couple of simple cysts again seen arising from the right kidney and a small simple cyst also on the left. No stones in the kidneys or ureters. No hydronephrosis. No perinephric or periureteral stranding. Urinary bladder is unremarkable. GI AND BOWEL: There are  embolization coils again demonstrated posterior to the hepatic flexure. There is some active hemorrhage within the hepatic flexure seen on image 116 of series 5 and image 81 of coronal series 14. There are a few scattered colonic diverticula present. Stomach and duodenal sweep demonstrate no acute abnormality. There is no bowel obstruction. No abnormal bowel wall thickening or distension.  REPRODUCTIVE: The patient is status post hysterectomy and bilateral salpingo-oophorectomy. PERITONEUM AND RETROPERITONEUM: No ascites or free air. LYMPH NODES: No lymphadenopathy. BONES AND SOFT TISSUES: No acute abnormality of the bones. No acute soft tissue abnormality. IMPRESSION: 1. Active hemorrhage within the hepatic flexure. 2. Embolization coils posterior to the hepatic flexure. 3. Scattered colonic diverticula. 4. Streaky peribronchovascular opacities in the bilateral lower lobes, slightly worse on the left, similar to prior. Electronically signed by: Evalene Coho MD 05/11/2024 05:01 AM EST RP Workstation: [id number]    Anti-infectives: Anti-infectives (From admission, onward)    None       Assessment/Plan: Lower GI bleed, s/p right colon IR embolization. Follow up CT scan with inflammatory changes in the right colon, no pneumatosis, no tenderness,   FEN - prep for colonoscopy VTE - hold for bleeding issues  Disposition - plan for colonoscopy this AM. Would not recommend surgery at this time but will continue to follow in case bleeding persists despite GI interventions.  LOS: 6 days    Casey Silversmith, MD General Surgery, Surgical Critical Care and Trauma   05/12/2024

## 2024-05-12 NOTE — Transfer of Care (Signed)
 Immediate Anesthesia Transfer of Care Note  Patient: Casey Johnston  Procedure(s) Performed: COLONOSCOPY CONTROL OF HEMORRHAGE, GI TRACT, ENDOSCOPIC, BY CLIPPING OR OVERSEWING  Patient Location: Endoscopy Unit  Anesthesia Type:MAC  Level of Consciousness: drowsy and responds to stimulation  Airway & Oxygen Therapy: Patient Spontanous Breathing and Patient connected to nasal cannula oxygen  Post-op Assessment: Report given to RN and Post -op Vital signs reviewed and stable  Post vital signs: Reviewed and stable  Last Vitals:  Vitals Value Taken Time  BP 127/53 05/12/24 11:30  Temp    Pulse 92 05/12/24 11:31  Resp 17 05/12/24 11:32  SpO2 96 % 05/12/24 11:31  Vitals shown include unfiled device data.  Last Pain:  Vitals:   05/12/24 0935  TempSrc: Temporal  PainSc: 0-No pain      Patients Stated Pain Goal: 0 (05/12/24 0935)  Complications: No notable events documented.

## 2024-05-12 NOTE — Op Note (Signed)
 [facility] Patient Name: Casey Johnston Procedure Date : 05/12/2024 MRN: [id number] Attending MD: Inocente Hausen , MD, [id number] Date of Birth: 09/18/1960 CSN: [id number] Age: 63 Admit Type: Inpatient Procedure:                Colonoscopy Indications:              Hematochezia, Acute post hemorrhagic anemia;                            History of hepatic flexure AVM s/p IR embolization                            12/19 with ongoing bleeding; CTA suggests bleeding                            at hepatic flexure. Providers:                Inocente Hausen, MD, Ozell Pouch, Haskel Chris, Technician Referring MD:              Medicines:                Monitored Anesthesia Care Complications:            No immediate complications. Estimated blood loss:                            None. Estimated Blood Loss:     Estimated blood loss: none. Procedure:                Pre-Anesthesia Assessment:                           - Prior to the procedure, a History and Physical                            was performed, and patient medications and                            allergies were reviewed. The patient's tolerance of                            previous anesthesia was also reviewed. The risks                            and benefits of the procedure and the sedation                            options and risks were discussed with the patient.                            All questions were answered, and informed consent                            was obtained. Prior Anticoagulants: The patient  has                            taken no anticoagulant or antiplatelet agents. ASA                            Grade Assessment: III - A patient with severe                            systemic disease. After reviewing the risks and                            benefits, the patient was deemed in satisfactory                            condition to undergo the  procedure.                           After obtaining informed consent, the colonoscope                            was passed under direct vision. Throughout the                            procedure, the patient's blood pressure, pulse, and                            oxygen saturations were monitored continuously. The                            CF-HQ190L ([id number] Olympus colonoscope was                            introduced through the anus and advanced to the                            terminal ileum. The terminal ileum, ileocecal                            valve, appendiceal orifice, and rectum were                            photographed. The colonoscopy was somewhat                            difficult due to large amount of blood and cot in                            colon. The quality of the bowel preparation was                            adequate for the purpose of the exam. Scope In: 10:35:25 AM Scope Out: 11:19:31 AM Scope Withdrawal Time: 0 hours 29 minutes 53 seconds  Total Procedure Duration: 0 hours 44 minutes  6 seconds  Findings:      Hemorrhoids were found on perianal exam.      The digital rectal exam was normal. Pertinent negatives include normal       sphincter tone and no palpable rectal lesions.      Red blood was found in the entire colon. Extensive lavage performed to       clear mucosa of blood.      The terminal ileum appeared normal. No blood in TI.      A single (solitary) ten mm ulcer was found at the hepatic flexure. No       active bleeding was witnessed from the ulcer. For hemostasis, two       hemostatic clips were successfully placed cover the ulcer to prevent       future bleeding. No additional ulcers were seen in hepatic flexure       although large amount of blood present obscured some views.      A few small-mouthed diverticula were found in the sigmoid colon and       descending colon.      Internal hemorrhoids were found during  retroflexion. Impression:               - Hemorrhoids found on perianal exam.                           - Blood in the entire examined colon.                           - The examined portion of the ileum was normal.                           - A single (solitary) ulcer at the hepatic flexure.                            This may be a post embolization ulcer. Two clips                            were placed.                           - Diverticulosis in the sigmoid colon and in the                            descending colon.                           - Internal hemorrhoids.                           - No specimens collected. Recommendation:           - Return patient to hospital ward for ongoing care.                           - Monitor serial H/H and transfuse as appropriate.                            A significant amount of blood was  present in the                            colon during the exam so hemoglobin may have                            declined further form last H/H this AM.                           - Monitor hemodynamics.                           - Surgical colleagues following.                           - The findings and recommendations were discussed                            with the patient. Procedure Code(s):        --- Professional ---                           [phone], Colonoscopy, flexible; with control of                            bleeding, any method Diagnosis Code(s):        --- Professional ---                           K92.2, Gastrointestinal hemorrhage, unspecified                           K64.8, Other hemorrhoids                           K63.3, Ulcer of intestine                           K92.1, Melena (includes Hematochezia)                           D62, Acute posthemorrhagic anemia                           K57.30, Diverticulosis of large intestine without                            perforation or abscess without bleeding CPT copyright 2022 American  Medical Association. All rights reserved. The codes documented in this report are preliminary and upon coder review may  be revised to meet current compliance requirements. Inocente Hausen, MD 05/12/2024 11:30:58 AM This report has been signed electronically. Number of Addenda: 0

## 2024-05-12 NOTE — H&P (Signed)
 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 The patient agreed with the plan.   HPI: Casey Johnston is a 63 y.o. 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  Patient was transferred to [facility] for IR embolization of angioectasia and hepatic flexure 05/08/2024.  Patient initially stabilized but then evidence of ongoing gastrointestinal hemorrhage.  Follow-up CTA showed hemorrhage at the hepatic flexure with embolization coils present.  Colonoscopy is performed today for diagnostic and potentially therapeutic management of lower gastrointestinal bleeding.   Past Medical History:  Diagnosis Date   Anxiety and depression    Chronic constipation    Diabetes mellitus, type II (HCC)    Dr. Lenis managing   Frequent headaches chronic   ibuprofen helps some   GERD (gastroesophageal reflux disease)    History of UTI    Recurrent per pt (approx 3 per year)   Hyperlipidemia    Hypertension    Leukocytosis 12/2015   mild lymphocytosis.  Path smear review reassuring.  Repeat 01/2016 stable.   Morbid obesity (HCC)    OSA on CPAP    not using CPAP anymore    Past Surgical History:  Procedure Laterality Date   ABDOMINAL HYSTERECTOMY  1998   Dysfunctional uterine bleeding.  No hx of abnormal paps.   BIOPSY  10/20/2018   Procedure: BIOPSY;  Surgeon: Harvey Margo CROME, MD;  [facility];  Service: Endoscopy;;  stomach   CATARACT EXTRACTION W/PHACO Left 08/24/2022   Procedure: CATARACT EXTRACTION PHACO AND INTRAOCULAR LENS PLACEMENT (IOC);  Surgeon: Harrie Agent, MD;  Location: [facility];  Service: Ophthalmology;  Laterality: Left;  CDE 8.53   CATARACT EXTRACTION W/PHACO  Right 09/07/2022   Procedure: CATARACT EXTRACTION PHACO AND INTRAOCULAR LENS PLACEMENT (IOC);  Surgeon: Harrie Agent, MD;  Location: [facility];  Service: Ophthalmology;  Laterality: Right;  CDE: 10.02   COLONOSCOPY  2012   Dr. Gaylia: 1.5 cm pendunculated descending colon polyp (tubulovillous adenoma) and diminutive hyperplastic polyps   COLONOSCOPY N/A 10/20/2018   Procedure: COLONOSCOPY;  Surgeon: Harvey Margo CROME, MD; 4 tubular adenomas, diverticulosis in the sigmoid colon, tortuous left colon, external and internal hemorrhoids. Due for repeat in 2023.    COLONOSCOPY N/A 04/07/2024   Procedure: COLONOSCOPY;  Surgeon: Eartha Angelia Sieving, MD;  [facility];  Service: Gastroenterology;  Laterality: N/A;   COLONOSCOPY WITH PROPOFOL  N/A 12/24/2022   Procedure: COLONOSCOPY WITH PROPOFOL ;  Surgeon: Cindie Carlin POUR, DO;  [facility];  Service: Endoscopy;  Laterality: N/A;  11:15 am, asa 3   ENTEROSCOPY N/A 04/11/2024   Procedure: ENTEROSCOPY;  Surgeon: Cindie Carlin POUR, DO;  [facility];  Service: Endoscopy;  Laterality: N/A;   ESOPHAGOGASTRODUODENOSCOPY N/A 10/20/2018   Procedure: ESOPHAGOGASTRODUODENOSCOPY (EGD);  Surgeon: Harvey Margo CROME, MD;  gastritis and duodenitis. No H. Pylori.    ESOPHAGOGASTRODUODENOSCOPY N/A 04/07/2024   Procedure: EGD (ESOPHAGOGASTRODUODENOSCOPY);  Surgeon: Eartha Angelia, Sieving, MD;  [facility];  Service: Gastroenterology;  Laterality: N/A;  WITH ENTEROSCOPY   ESOPHAGOGASTRODUODENOSCOPY N/A 04/05/2024   Procedure: EGD (ESOPHAGOGASTRODUODENOSCOPY);  Surgeon: Cinderella Deatrice JULIANNA, MD;  [facility];  Service: Endoscopy;  Laterality: N/A;   GIVENS CAPSULE STUDY  N/A 04/09/2024   Procedure: IMAGING PROCEDURE, GI TRACT, INTRALUMINAL, VIA CAPSULE;  Surgeon: Cinderella Deatrice FALCON, MD;  [facility];  Service: Endoscopy;  Laterality: N/A;   IR ANGIOGRAM SELECTIVE EACH ADDITIONAL VESSEL  05/08/2024   IR ANGIOGRAM  SELECTIVE EACH ADDITIONAL VESSEL  05/08/2024   IR ANGIOGRAM SELECTIVE EACH ADDITIONAL VESSEL  05/08/2024   IR ANGIOGRAM VISCERAL SELECTIVE  05/08/2024   IR EMBO ART  VEN HEMORR LYMPH EXTRAV  INC GUIDE ROADMAPPING  05/08/2024   IR US  GUIDE VASC ACCESS RIGHT  05/08/2024   POLYPECTOMY  10/20/2018   Procedure: POLYPECTOMY;  Surgeon: Harvey Margo CROME, MD;  [facility];  Service: Endoscopy;;   POLYPECTOMY  12/24/2022   Procedure: POLYPECTOMY INTESTINAL;  Surgeon: Cindie Carlin POUR, DO;  [facility];  Service: Endoscopy;;   RADIOLOGY WITH ANESTHESIA N/A 05/08/2024   Procedure: RADIOLOGY WITH ANESTHESIA;  Surgeon: Radiologist, Medication, MD;  Location: [facility];  Service: Radiology;  Laterality: N/A;   wart removal  08/04/2019   benign    Prior to Admission medications  Medication Sig Start Date End Date Taking? Authorizing Provider  atorvastatin  (LIPITOR) 20 MG tablet TAKE 1 Tablet BY MOUTH ONCE EVERY DAY Patient taking differently: Take 20 mg by mouth daily. TAKE 1 Tablet BY MOUTH ONCE EVERY DAY 10/05/21  Yes Comer Kirsch, PA-C  cloNIDine  (CATAPRES ) 0.1 MG tablet Take 1 tablet (0.1 mg total) by mouth 2 (two) times daily. Patient taking differently: Take 0.1 mg by mouth daily. 10/05/21  Yes Comer Kirsch, PA-C  Dulaglutide (TRULICITY) 0.75 MG/0.5ML SOPN Inject 0.75 mg into the skin every Monday.   Yes [provider]  FEROSUL 325 (65 Fe) MG tablet Take 325 mg by mouth daily. 04/24/24  Yes [provider]  linaclotide  (LINZESS ) 72 MCG capsule Take 72 mcg by mouth daily as needed (constipation).   Yes [provider]  losartan -hydrochlorothiazide  (HYZAAR) 100-12.5 MG tablet Take 1 tablet by mouth daily. 11/09/22  Yes [provider]  metFORMIN  (GLUCOPHAGE ) 1000 MG tablet Take 1 tablet (1,000 mg total) by mouth 2 (two) times daily with a meal. 10/05/21  Yes Comer Kirsch, PA-C  pantoprazole  (PROTONIX ) 40 MG tablet Take 1 tablet (40 mg total)  by mouth 2 (two) times daily before a meal. 04/12/24  Yes Sigdel, Santosh, MD  glipiZIDE  (GLUCOTROL  XL) 5 MG 24 hr tablet Take 1 tablet (5 mg total) by mouth daily with breakfast. 10/18/22   Lenis Ethelle ORN, MD    Current Facility-Administered Medications  Medication Dose Route Frequency Provider Last Rate Last Admin   [MAR Hold] 0.9 %  sodium chloride  infusion (Manually program via Guardrails IV Fluids)   Intravenous Once Paliwal, Ria, MD       [MAR Hold] 0.9 %  sodium chloride  infusion (Manually program via Guardrails IV Fluids)   Intravenous Once Paliwal, Aditya, MD       0.9 %  sodium chloride  infusion   Intravenous Continuous Suzann Inocente HERO, MD 20 mL/hr at 05/12/24 0944 New Bag at 05/12/24 0944   [MAR Hold] acetaminophen  (TYLENOL ) tablet 650 mg  650 mg Oral Q6H PRN Arlon Carliss ORN, DO   650 mg at 05/08/24 2007   Or   [MAR Hold] acetaminophen  (TYLENOL ) suppository 650 mg  650 mg Rectal Q6H PRN Arlon Carliss ORN, DO       [MAR Hold] atorvastatin  (LIPITOR) tablet 20 mg  20 mg Oral Daily Arlon Carliss W, DO   20 mg at 05/10/24 1142   [  MAR Hold] Chlorhexidine  Gluconate Cloth 2 % PADS 6 each  6 each Topical Daily Mohammed, Shahid, MD   6 each at 05/12/24 0600   [MAR Hold] diphenhydrAMINE  (BENADRYL ) injection 12.5 mg  12.5 mg Intravenous Q6H PRN Mannam, Praveen, MD   12.5 mg at 05/11/24 1018   [MAR Hold] hydrALAZINE  (APRESOLINE ) injection 10 mg  10 mg Intravenous Q4H PRN Arlon Carliss ORN, DO       [MAR Hold] HYDROcodone -acetaminophen  (NORCO/VICODIN) 5-325 MG per tablet 1-2 tablet  1-2 tablet Oral Q4H PRN Arlon Carliss ORN, DO   1 tablet at 05/10/24 1148   [MAR Hold] insulin  aspart (novoLOG ) injection 0-15 Units  0-15 Units Subcutaneous TID WC Arlon Carliss ORN, DO   3 Units at 05/12/24 0827   [MAR Hold] insulin  aspart (novoLOG ) injection 0-5 Units  0-5 Units Subcutaneous QHS Arlon Carliss ORN, DO   4 Units at 05/08/24 2140   [facility] Hold] ondansetron  (ZOFRAN ) tablet 4 mg  4 mg Oral Q6H PRN  Arlon Carliss ORN, DO       Or   [MAR Hold] ondansetron  (ZOFRAN ) injection 4 mg  4 mg Intravenous Q6H PRN Arlon Carliss ORN, DO   4 mg at 05/11/24 1427   [MAR Hold] pantoprazole  (PROTONIX ) injection 40 mg  40 mg Intravenous Q12H Mohammed, Tamela, MD   40 mg at 05/12/24 0453   [facility] Hold] prochlorperazine  (COMPAZINE ) injection 10 mg  10 mg Intravenous Q6H PRN Howerter, Justin B, DO   10 mg at 05/11/24 1612   [MAR Hold] sodium chloride  flush (NS) 0.9 % injection 3 mL  3 mL Intravenous Q12H Arlon Carliss ORN, DO   3 mL at 05/11/24 2123   [facility] Hold] sodium chloride  flush (NS) 0.9 % injection 3 mL  3 mL Intravenous PRN Arlon Carliss ORN, DO       [MAR Hold] traZODone  (DESYREL ) tablet 50 mg  50 mg Oral QHS PRN Arlon Carliss ORN, DO   50 mg at 05/08/24 2007    Allergies as of 05/06/2024 - Review Complete 05/06/2024  Allergen Reaction Noted   Januvia  [sitagliptin ] Other (See Comments) 05/21/2018   Amlodipine  Swelling 10/19/2020    Family History  Problem Relation Age of Onset   Cancer Mother        ovarian   COPD Father    Diabetes Father    Hyperlipidemia Father    Heart attack Father    Heart failure Father    Colon cancer Neg Hx     Social History   Socioeconomic History   Marital status: Divorced    Spouse name: Not on file   Number of children: Not on file   Years of education: Not on file   Highest education level: Not on file  Occupational History   Not on file  Tobacco Use   Smoking status: Every Day    Current packs/day: 0.25    Average packs/day: 0.3 packs/day for 20.0 years (5.0 ttl pk-yrs)    Types: Cigarettes   Smokeless tobacco: Never  Vaping Use   Vaping status: Never Used  Substance and Sexual Activity   Alcohol use: No   Drug use: No   Sexual activity: Not on file  Other Topics Concern   Not on file  Social History Narrative   Separated x 20 yrs, one daughter.  She lives with her.  Great niece and nephew live with her as well.   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   Tob: 7 pack year hx, quit 2010.  Alc: none   Exercise: none   Social Drivers of Health   Tobacco Use: High Risk (05/12/2024)   Patient History    Smoking Tobacco Use: Every Day    Smokeless Tobacco Use: Never    Passive Exposure: Not on file  Financial Resource Strain: Low Risk (03/26/2023)   Received from [facility]   Overall Financial Resource Strain (CARDIA)    Difficulty of Paying Living Expenses: Not hard at all  Food Insecurity: No Food Insecurity (05/06/2024)   Epic    Worried About Radiation Protection Practitioner of Food in the Last Year: Never true    Ran Out of Food in the Last Year: Never true  Transportation Needs: No Transportation Needs (05/06/2024)   Epic    Lack of Transportation (Medical): No    Lack of Transportation (Non-Medical): No  Physical Activity: Sufficiently Active (03/26/2023)   Received from [facility]   Exercise Vital Sign    On average, how many days per week do you engage in moderate to strenuous exercise (like a brisk walk)?: 6 days    On average, how many minutes do you engage in exercise at this level?: 30 min  Stress: Stress Concern Present (03/26/2023)   Received from [facility] - Occupational Stress Questionnaire    Feeling of Stress : To some extent  Social Connections: Moderately Isolated (03/26/2023)   Received from [facility]   Social Connection and Isolation Panel    In a typical week, how many times do you talk on the phone with family, friends, or neighbors?: Once a week    How often do you get together with friends or relatives?: Three times a week    How often do you attend church or religious services?: More than 4 times per year    Do you belong to any clubs or organizations such as church groups, unions, fraternal or athletic groups, or school groups?: No    How often do you attend meetings of the clubs or organizations you belong to?: Never    Are you married, widowed,  divorced, separated, never married, or living with a partner?: Divorced  Intimate Partner Violence: Not At Risk (05/06/2024)   Epic    Fear of Current or Ex-Partner: No    Emotionally Abused: No    Physically Abused: No    Sexually Abused: No  Depression (PHQ2-9): Not on file  Alcohol Screen: Not on file  Housing: Low Risk (05/06/2024)   Epic    Unable to Pay for Housing in the Last Year: No    Number of Times Moved in the Last Year: 0    Homeless in the Last Year: No  Utilities: Not At Risk (05/06/2024)   Epic    Threatened with loss of utilities: No  Health Literacy: Low Risk (03/26/2023)   Received from [facility]    How often do you need to have someone help you when you read instructions, pamphlets, or other written material from your doctor or pharmacy?: Never    Review of Systems:  All other review of systems negative except as mentioned in the HPI.  Physical Exam: Vital signs BP (!) 159/60   Pulse 78   Temp 97.8 F (36.6 C) (Temporal)   Resp 19   Ht 5' 5 (1.651 m)   Wt 88.5 kg   SpO2 100%   BMI 32.45 kg/m   General:  Alert,  Well-developed, well-nourished, pleasant and cooperative in NAD Lungs:  Clear throughout to auscultation.   Heart:  Regular rate and rhythm; no murmurs, clicks, rubs,  or gallops. Abdomen:  Soft, nontender and nondistended. Normal bowel sounds.   Neuro/Psych:  Normal mood and affect. A and O x 3  Inocente Hausen, MD [facility]

## 2024-05-12 NOTE — Anesthesia Preprocedure Evaluation (Addendum)
"                                    Anesthesia Evaluation  Patient identified by MRN, date of birth, ID band Patient awake    Reviewed: Allergy & Precautions, NPO status , Patient's Chart, lab work & pertinent test results, reviewed documented beta blocker date and time   History of Anesthesia Complications Negative for: history of anesthetic complications  Airway Mallampati: II  TM Distance: >3 FB Neck ROM: Full    Dental  (+) Teeth Intact, Dental Advisory Given   Pulmonary sleep apnea and Continuous Positive Airway Pressure Ventilation , Current Smoker   breath sounds clear to auscultation       Cardiovascular hypertension, Pt. on medications  Rhythm:Regular Rate:Normal + Systolic murmurs    Neuro/Psych    GI/Hepatic ,GERD  Medicated and Controlled,, Hx of gastric and small bowel AVMs with active GI bleed   Endo/Other  diabetes, Type 2    Renal/GU Renal disease     Musculoskeletal   Abdominal   Peds  Hematology  (+) Blood dyscrasia, anemia Hgb 8.0, Plts 88K (05/12/24)   Anesthesia Other Findings   Reproductive/Obstetrics                              Anesthesia Physical Anesthesia Plan  ASA: 3  Anesthesia Plan: MAC   Post-op Pain Management:    Induction: Intravenous  PONV Risk Score and Plan: 2 and Treatment may vary due to age or medical condition  Airway Management Planned: Oral ETT  Additional Equipment: None  Intra-op Plan:   Post-operative Plan: Extubation in OR  Informed Consent: I have reviewed the patients History and Physical, chart, labs and discussed the procedure including the risks, benefits and alternatives for the proposed anesthesia with the patient or authorized representative who has indicated his/her understanding and acceptance.     Dental advisory given  Plan Discussed with: CRNA  Anesthesia Plan Comments:          Anesthesia Quick Evaluation  "

## 2024-05-12 NOTE — Progress Notes (Addendum)
 Patient's sole peripheral IV began leaking upon administration of PRBC.  IV was removed and blood was stopped.  RN spoke with blood bank to ensure that it was acceptable to get new IV and restart blood.  Was told that this is fine as long that it was within four hours.  IV team order was placed and will await placement of new IV to restart blood, assuming it is within four hour deadline.

## 2024-05-12 NOTE — Progress Notes (Signed)
 Initial Nutrition Assessment  DOCUMENTATION CODES:      INTERVENTION:  Diet advancement per MD - Currently CLD Boost Breeze po TID, each supplement provides 250 kcal and 9 grams of protein MVI with minerals daily  100 mg Thiamine  x 7 days Monitor magnesium , potassium, and phosphorus daily for at least 3 days, MD to replete as needed, as pt is at risk for refeeding syndrome given NPO/CLD x 6 days If diet advanced to FLD recommend Ensure Plus High Protein po TID, each supplement provides 350 kcal and 20 grams of protein and monitor intake/diet adv If pt continues to be NPO/CLD recommend starting nutrition support in 48-72 hours.   NUTRITION DIAGNOSIS:   Inadequate oral intake related to acute illness as evidenced by  (NPO/CLD).   GOAL:   Patient will meet greater than or equal to 90% of their needs   MONITOR:   PO intake, Supplement acceptance, Diet advancement, Labs  REASON FOR ASSESSMENT:   NPO/Clear Liquid Diet    ASSESSMENT:   63 year old female with a PMH of HTN, HLD, DM, GERD, IBS, diverticulosis,gastric ulcers, duodenal ulcers, GI bleeds form duodenal AVMs, HTN, hyperlipidemia, and GERD. Admitted for dark melanotic stools and progressive weakness. Recent admission 04/04/24-04/12/24 with similar GI bleed.  12/17 - [facility]ed to [name] with hematochezia and SOB 12/18 - CT angio with active contrast extravasation involving the hepatic flexure of the colon consistent with active GI bleed 12/19 - Transferred to [name], s/p IR Embolization of bleeding at hepatic flexure  12/20 - Medium sized bowel BM with bright red blood, Given 2 units PRBC 12/22 - CTA showed active hemorrhage within the hepatic flexure with embolization coils present, large bloody bowel movement after bowel prep  12/22 - Bowel prep became nauseas and weak. Large amount of bloody discharge  12/23 - s/p colonoscopy, 2 clips placed at hepatic flexure, internal hemorrhoids, diverticulosis, CLD  Pt  awake and alert, had coloscopy earlier today. From op note, blood was seen in entire colon, 2 clips placed at the haptic flexure, internal hemorrhoids found. Returned back to unit and pt to be transfused as needed.   Pt with recent admission from 11/15-11/23 for similar presentation of GI bleed. Treated with 9 units PRBC, endoscopy, and colonoscopy. Pt reports she was following her normal eating habits prior to 1st admission and in between current admission. Denies recent weight loss or loss of appetite/po intake. UBW 190-200 lbs.   Pt typically only eats lunch and dinner. With lunch consisting of a sandwich and dinner being her bigger meal of takeout or a home cooked meal. Snacks on chips, pretzels, etc. Does not take ONS at home.   Pt is NPO/CLD x 6 days as she was having ongoing GI bleed after embolization.  Was NPO for colonoscopy today. Diet just advanced in the afternoon to clears. Discussed progression of diet with pt and what is allowed on CLD,FLD, etc. RD provided pt with Boost Breeze in which she was agreeable to continue.   Smoked 1 PPD of cigarettes for 20 years. Pt reports she quit in March of 2025. Drinks alcohol on special occasions.   If pt continues to be NPO/CLD recommend starting nutrition support in 48-72 hours. Likely pt will do well on FLD and above as she states having an appetite.    Temp (24hrs), Avg:98 F (36.7 C), Min:97.5 F (36.4 C), Max:98.7 F (37.1 C)  Admit weight: 88.5 kg  Current weight: 88.5 kg UBW 190-200 lbs  Wt Readings from Last 10 Encounters:  05/06/24 88.5 kg  04/21/24 88.5 kg  04/05/24 90.2 kg  06/24/23 91.5 kg  03/26/23 90.4 kg  12/24/22 99.3 kg  12/20/22 99.3 kg  11/29/22 99.3 kg  10/16/22 99.3 kg  09/07/22 101.8 kg  09/06/22 101.8 kg  -   Intake/Output Summary (Last 24 hours) at 05/12/2024 1528 Last data filed at 05/12/2024 1114 Gross per 24 hour  Intake 500 ml  Output --  Net 500 ml   Net IO Since Admission: 8,560.48 mL  [05/12/24 1528]  Average Meal Intake: NPO  Nutritionally Relevant Medications: Scheduled Meds:  feeding supplement  1 Container Oral TID BM   insulin  aspart  0-15 Units Subcutaneous TID WC   insulin  aspart  0-5 Units Subcutaneous QHS   multivitamin with minerals  1 tablet Oral Daily   thiamine   100 mg Oral Daily   Labs Reviewed: Sodium 132 CBG ranges from 116-148 mg/dL over the last 24 hours HgbA1c 6.8  NUTRITION - FOCUSED PHYSICAL EXAM:  Flowsheet Row Most Recent Value  Orbital Region No depletion  Upper Arm Region No depletion  Thoracic and Lumbar Region No depletion  Buccal Region No depletion  Temple Region No depletion  Clavicle Bone Region No depletion  Clavicle and Acromion Bone Region No depletion  Scapular Bone Region No depletion  Dorsal Hand No depletion  Patellar Region No depletion  Anterior Thigh Region No depletion  Posterior Calf Region No depletion  Edema (RD Assessment) Mild  Hair Reviewed  Eyes Reviewed  Mouth Reviewed  Skin Reviewed  Nails Reviewed    Diet Order:   Diet Order             Diet clear liquid Room service appropriate? Yes; Fluid consistency: Thin  Diet effective now                   EDUCATION NEEDS:   Education needs have been addressed  Skin:  Skin Assessment: Reviewed RN Assessment  Last BM:  12/23 - type 6  Height:   Ht Readings from Last 1 Encounters:  05/12/24 5' 5 (1.651 m)    Weight:   Wt Readings from Last 1 Encounters:  05/12/24 88.5 kg    BMI:  Body mass index is 32.45 kg/m.  Estimated Nutritional Needs:   Kcal:  2000-2200 kcal  Protein:  105-125 gm  Fluid:  >2L/day   Olivia Kenning, RD Registered Dietitian  See Amion for more information

## 2024-05-12 NOTE — Progress Notes (Signed)
" ° °  NAME:  Casey Johnston, MRN:  [id number], DOB:  [birth year], LOS: 6 ADMISSION DATE:  05/06/2024, CONSULTATION DATE:  05/08/24 REFERRING MD:  TRH, CHIEF COMPLAINT: Active GI bleed  History of Present Illness:  Casey Johnston is a 63 year old female with a past medical history significant for type 2 diabetes, HTN, HLD, OSA on no longer utilizing CPAP, GERD and anxiety who presented to the ED at [facility] 12/17 for complaints of shortness of breath and hematochezia.  Of note patient has history of gastric and small bowel AVMs with active GI bleed in the past, recent colonoscopy with diverticular disease.  Patient was admitted per hospitalist.  Patient seen with active bleed overnight 12/19 with resultant hypotension and tachycardia resulting in transfer to ICU for close monitoring  Pertinent  Medical History  Type 2 diabetes, HTN, HLD, OSA on no longer utilizing CPAP, GERD and anxiety  Significant Hospital Events: Including procedures, antibiotic start and stop dates in addition to other pertinent events   12/17 presented to [name], ED with hematochezia and shortness of breath 12/18 CT angio with active contrast extravasation involving the hepatic flexure of the colon consistent with active GI bleed > interventional radiology consulted.  Unfortunately patient remains at [facility] and was unable to undergo IR evaluation.  Patient arrived to [phone] at 1913 12/19 ongoing GI bleed overnight with hypotension and tachycardia.  Underwent embolization of right colic branches 12/20  Softer blood pressure today morning.  Had an episode of medium sized bowel movement with bright red blood.  Hemoglobin down to 6.4.  Given 2 units PRBC.  Surgery and GI consulted.  Repeat CTA done 12/23 colonoscopy    Interim History / Subjective:   Ok night. Some pink seeming BM per pt.  Mouth is dry, no complaints otherwise  Hgb 8 this morning, cbgs ok  No bmp   Objective    Blood pressure (!) 121/54,  pulse 78, temperature 98.1 F (36.7 C), temperature source Oral, resp. rate 16, height 5' 5 (1.651 m), weight 88.5 kg, SpO2 100%.        Intake/Output Summary (Last 24 hours) at 05/12/2024 0902 Last data filed at 05/11/2024 1500 Gross per 24 hour  Intake 744.33 ml  Output --  Net 744.33 ml   Filed Weights   05/06/24 1132  Weight: 88.5 kg    Examination: Gen: WNWD adult F NAD Neuro: AAOx4 HEENT: NCAT pink tacky mm CV rr s1s2 Pulm: CTAb on RA GI: soft ndnt GU: defer     Resolved problem list  Acute kidney injury  Assessment and Plan    Lower GIB s/p IR embo, w recurrent bleed R colon angiodysplasia  ABLA superimposed on chronic anemia  Thrombocytopenia  P: -serial H/H, daily CBC (12/23 [facility] for noon)  -transfuse PRN, goal >7 -NPO for colonoscope 12/23   DM2 P: -SSI   Hyponatremia, mild P -follow -no intervention 12/23, NPO   Leukocytosis, mild and improving   P: -follow fever curve   Dispo: Will follow up post procedure. Possible txf out of ICU 12/23     High MDM   Ronnald Gave MSN, AGACNP-BC [facility] Pulmonary/Critical Care Medicine Amion for pager  05/12/2024, 9:02 AM     "

## 2024-05-13 DIAGNOSIS — R109 Unspecified abdominal pain: Secondary | ICD-10-CM | POA: Diagnosis not present

## 2024-05-13 DIAGNOSIS — K633 Ulcer of intestine: Secondary | ICD-10-CM | POA: Diagnosis not present

## 2024-05-13 LAB — CBC
HCT: 20.8 % — ABNORMAL LOW (ref 36.0–46.0)
Hemoglobin: 7.1 g/dL — ABNORMAL LOW (ref 12.0–15.0)
MCH: 30 pg (ref 26.0–34.0)
MCHC: 34.1 g/dL (ref 30.0–36.0)
MCV: 87.8 fL (ref 80.0–100.0)
Platelets: 145 K/uL — ABNORMAL LOW (ref 150–400)
RBC: 2.37 MIL/uL — ABNORMAL LOW (ref 3.87–5.11)
RDW: 15 % (ref 11.5–15.5)
WBC: 13.3 K/uL — ABNORMAL HIGH (ref 4.0–10.5)
nRBC: 0.2 % (ref 0.0–0.2)

## 2024-05-13 LAB — BASIC METABOLIC PANEL WITH GFR
Anion gap: 4 — ABNORMAL LOW (ref 5–15)
BUN: 8 mg/dL (ref 8–23)
CO2: 26 mmol/L (ref 22–32)
Calcium: 7.3 mg/dL — ABNORMAL LOW (ref 8.9–10.3)
Chloride: 103 mmol/L (ref 98–111)
Creatinine, Ser: 0.68 mg/dL (ref 0.44–1.00)
GFR, Estimated: >60 mL/min
Glucose, Bld: 184 mg/dL — ABNORMAL HIGH (ref 70–99)
Potassium: 4.1 mmol/L (ref 3.5–5.1)
Sodium: 133 mmol/L — ABNORMAL LOW (ref 135–145)

## 2024-05-13 LAB — HEMOGLOBIN AND HEMATOCRIT, BLOOD
HCT: 20.4 % — ABNORMAL LOW (ref 36.0–46.0)
HCT: 20.7 % — ABNORMAL LOW (ref 36.0–46.0)
HCT: 20 % — ABNORMAL LOW (ref 36.0–46.0)
HCT: 21.1 % — ABNORMAL LOW (ref 36.0–46.0)
Hemoglobin: 7.1 g/dL — ABNORMAL LOW (ref 12.0–15.0)
Hemoglobin: 7.1 g/dL — ABNORMAL LOW (ref 12.0–15.0)
Hemoglobin: 6.6 g/dL — CL (ref 12.0–15.0)
Hemoglobin: 7.6 g/dL — ABNORMAL LOW (ref 12.0–15.0)

## 2024-05-13 LAB — MAGNESIUM: Magnesium: 1.8 mg/dL (ref 1.7–2.4)

## 2024-05-13 LAB — PREPARE RBC (CROSSMATCH)

## 2024-05-13 LAB — GLUCOSE, CAPILLARY
Glucose-Capillary: 216 mg/dL — ABNORMAL HIGH (ref 70–99)
Glucose-Capillary: 224 mg/dL — ABNORMAL HIGH (ref 70–99)
Glucose-Capillary: 183 mg/dL — ABNORMAL HIGH (ref 70–99)
Glucose-Capillary: 261 mg/dL — ABNORMAL HIGH (ref 70–99)

## 2024-05-13 LAB — PHOSPHORUS: Phosphorus: 2.7 mg/dL (ref 2.5–4.6)

## 2024-05-13 MED ORDER — PANTOPRAZOLE SODIUM 40 MG PO TBEC
40.0000 mg | DELAYED_RELEASE_TABLET | Freq: Two times a day (BID) | ORAL | Status: AC
  Administered 2024-05-13 – 2024-05-24 (×21): 40 mg via ORAL
  Filled 2024-05-13 (×9): qty 1

## 2024-05-13 MED ORDER — SODIUM CHLORIDE 0.9% IV SOLUTION: Freq: Once | INTRAVENOUS | Status: AC

## 2024-05-13 NOTE — Progress Notes (Signed)
 "  NAME:  Casey Johnston, MRN:  [id number], DOB:  04-Jan-1961, LOS: 7 ADMISSION DATE:  05/06/2024, CONSULTATION DATE:  05/08/24 REFERRING MD:  TRH, CHIEF COMPLAINT: Active GI bleed  History of Present Illness:  Casey Johnston is a 63 year old female with a past medical history significant for type 2 diabetes, HTN, HLD, OSA on no longer utilizing CPAP, GERD and anxiety who presented to the ED at [facility] 12/17 for complaints of shortness of breath and hematochezia.  Of note patient has history of gastric and small bowel AVMs with active GI bleed in the past, recent colonoscopy with diverticular disease.  Patient was admitted per hospitalist.  Patient seen with active bleed overnight 12/19 with resultant hypotension and tachycardia resulting in transfer to ICU for close monitoring  Pertinent  Medical History  Type 2 diabetes, HTN, HLD, OSA on no longer utilizing CPAP, GERD and anxiety  Significant Hospital Events: Including procedures, antibiotic start and stop dates in addition to other pertinent events   12/17 presented to [facility] Johnston, ED with hematochezia and shortness of breath 12/18 CT angio with active contrast extravasation involving the hepatic flexure of the colon consistent with active GI bleed > interventional radiology consulted.  Unfortunately patient remains at [facility] and was unable to undergo IR evaluation.  Patient arrived to [phone] at 1913 12/19 ongoing GI bleed overnight with hypotension and tachycardia.  Underwent embolization of right colic branches 12/20  Softer blood pressure today morning.  Had an episode of medium sized bowel movement with bright red blood.  Hemoglobin down to 6.4.  Given 2 units PRBC.  Surgery and GI consulted.  Repeat CTA done 12/23 colonoscopy. Ulcer found, clips placed. 1 PRBC for hgb dip likely reflectie of old blood seen during scope  12/24 HDS hgb stable txf to progressive  Interim History / Subjective:   1 PRBC late yesterday  Stable hgb  7  HDS   Did ok w clears, really wants to eat   No bleeding overnight  Objective    Blood pressure (!) 127/41, pulse 67, temperature 98 F (36.7 C), temperature source Oral, resp. rate 15, height 5' 5 (1.651 m), weight 88.5 kg, SpO2 97%.        Intake/Output Summary (Last 24 hours) at 05/13/2024 0952 Last data filed at 05/13/2024 9095 Gross per 24 hour  Intake 855 ml  Output --  Net 855 ml   Filed Weights   05/06/24 1132 05/12/24 0935  Weight: 88.5 kg 88.5 kg    Examination: Gen: wdwn middle aged F NAD  Neuro: AAOx4 no focal deficits  HEENT: NCAT pink mm  CV cap refill is brisk. Rr  Pulm: even unlabored on RA, symmetrical chest expansion  GI: soft ndnt  GU: defer     Resolved problem list  Acute kidney injury  Assessment and Plan    Lower GIB - s/p IR embo, with subsequent rebleed - s/p 2x clip hepatic flexure ulcer  R colon angiodysplasia ABLA superimposed on chronic anemia Thrombocytopenia  P: -d/w gi ok to adv diet as tolerated -follow h/h (will space out to q6) transfuse PRN  -monitor closely for rebleed  -bid ppi   Dm2  P: -SSI   Hyponatremia,  mild  P -follow bmp, likely to incr with adv diet   Leukocytosis, mild  -afebrile, dont think infectious  P: - follow fever curve     Dispo: stable to txf out of ICU 12/24. Will ask TRH to take over 12/25  Mod MDM   Casey Gave MSN, AGACNP-BC [facility] Pulmonary/Critical Care Medicine Amion for pager  05/13/2024, 9:52 AM     "

## 2024-05-13 NOTE — Progress Notes (Signed)
 "  General Surgery Follow Up Note  Subjective:    Overnight Issues:   Objective:  Vital signs for last 24 hours: Temp:  [98 F (36.7 C)-98.8 F (37.1 C)] 98.8 F (37.1 C) (12/24 1643) Pulse Rate:  [60-91] 77 (12/24 1700) Resp:  [9-30] 16 (12/24 1700) BP: (101-146)/(32-77) 119/50 (12/24 1700) SpO2:  [94 %-100 %] 95 % (12/24 1700)  Hemodynamic parameters for last 24 hours:    Intake/Output from previous day: 12/23 0701 - 12/24 0700 In: 802 [I.V.:500; Blood:302] Out: -   Intake/Output this shift: Total I/O In: 403 [P.O.:400; I.V.:3] Out: -   Vent settings for last 24 hours:    Physical Exam:  Gen: comfortable, no distress Neuro: follows commands, alert, communicative HEENT: PERRL Neck: supple CV: RRR Pulm: unlabored breathing on RA Abd: soft, NT  , +BM (patient reports small volume blood) GU: urine clear and yellow, +spontaneous void Extr: wwp, no edema  Results for orders placed or performed during the hospital encounter of 05/06/24 (from the past 24 hours)  Hemoglobin and hematocrit, blood     Status: Abnormal   Collection Time: 05/12/24  6:25 PM  Result Value Ref Range   Hemoglobin 5.9 (LL) 12.0 - 15.0 g/dL   HCT 82.4 (L) 63.9 - 53.9 %  Glucose, capillary     Status: Abnormal   Collection Time: 05/12/24  9:53 PM  Result Value Ref Range   Glucose-Capillary 141 (H) 70 - 99 mg/dL  Hemoglobin and hematocrit, blood     Status: Abnormal   Collection Time: 05/12/24 10:12 PM  Result Value Ref Range   Hemoglobin 7.0 (L) 12.0 - 15.0 g/dL   HCT 79.7 (L) 63.9 - 53.9 %  Hemoglobin and hematocrit, blood     Status: Abnormal   Collection Time: 05/13/24  3:28 AM  Result Value Ref Range   Hemoglobin 7.1 (L) 12.0 - 15.0 g/dL   HCT 79.5 (L) 63.9 - 53.9 %  Glucose, capillary     Status: Abnormal   Collection Time: 05/13/24  7:23 AM  Result Value Ref Range   Glucose-Capillary 216 (H) 70 - 99 mg/dL  Basic metabolic panel     Status: Abnormal   Collection Time: 05/13/24   8:13 AM  Result Value Ref Range   Sodium 133 (L) 135 - 145 mmol/L   Potassium 4.1 3.5 - 5.1 mmol/L   Chloride 103 98 - 111 mmol/L   CO2 26 22 - 32 mmol/L   Glucose, Bld 184 (H) 70 - 99 mg/dL   BUN 8 8 - 23 mg/dL   Creatinine, Ser 9.31 0.44 - 1.00 mg/dL   Calcium  7.3 (L) 8.9 - 10.3 mg/dL   GFR, Estimated >39 >39 mL/min   Anion gap 4 (L) 5 - 15  CBC     Status: Abnormal   Collection Time: 05/13/24  8:13 AM  Result Value Ref Range   WBC 13.3 (H) 4.0 - 10.5 K/uL   RBC 2.37 (L) 3.87 - 5.11 MIL/uL   Hemoglobin 7.1 (L) 12.0 - 15.0 g/dL   HCT 79.1 (L) 63.9 - 53.9 %   MCV 87.8 80.0 - 100.0 fL   MCH 30.0 26.0 - 34.0 pg   MCHC 34.1 30.0 - 36.0 g/dL   RDW 84.9 88.4 - 84.4 %   Platelets 145 (L) 150 - 400 K/uL   nRBC 0.2 0.0 - 0.2 %  Magnesium      Status: None   Collection Time: 05/13/24  8:13 AM  Result Value Ref  Range   Magnesium  1.8 1.7 - 2.4 mg/dL  Phosphorus     Status: None   Collection Time: 05/13/24  8:13 AM  Result Value Ref Range   Phosphorus 2.7 2.5 - 4.6 mg/dL  Hemoglobin and hematocrit, blood     Status: Abnormal   Collection Time: 05/13/24  9:34 AM  Result Value Ref Range   Hemoglobin 7.1 (L) 12.0 - 15.0 g/dL   HCT 79.2 (L) 63.9 - 53.9 %  Glucose, capillary     Status: Abnormal   Collection Time: 05/13/24 11:43 AM  Result Value Ref Range   Glucose-Capillary 224 (H) 70 - 99 mg/dL  Hemoglobin and hematocrit, blood     Status: Abnormal   Collection Time: 05/13/24  1:58 PM  Result Value Ref Range   Hemoglobin 6.6 (LL) 12.0 - 15.0 g/dL   HCT 79.9 (L) 63.9 - 53.9 %  Prepare RBC (crossmatch)     Status: None   Collection Time: 05/13/24  2:46 PM  Result Value Ref Range   Order Confirmation      ORDER PROCESSED BY BLOOD BANK Performed at [facility], [address], [place], [place] [address]   Glucose, capillary     Status: Abnormal   Collection Time: 05/13/24  4:40 PM  Result Value Ref Range   Glucose-Capillary 183 (H) 70 - 99 mg/dL    Assessment &  Plan:  Present on Admission:  Upper GI bleed  Angiectasia of gastrointestinal tract  Morbid obesity (HCC)  Anemia  ABLA (acute blood loss anemia)    LOS: 7 days   Additional comments:I reviewed the patient's new clinical lab test results.   and I reviewed the patients new imaging test results.    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 Hgb 6.6 from 7.1 yest, receiving 1u pRBC today   FEN - FLD, okay to continue and even advance per primary team recs VTE - hold for bleeding issues   Disposition - Would not recommend surgery at this time but will continue to follow in case bleeding persists despite GI interventions. Transfuse PRN, consider iron+vitC to aid in erythropoiesis. Limit blood draws. Supportive care.   Dreama GEANNIE Hanger, MD Trauma & General Surgery Please use [email or web address] to contact on call provider  05/13/2024  *Care during the described time interval was provided by me. I have reviewed this patient's available data, including medical history, events of note, physical examination and test results as part of my evaluation.  "

## 2024-05-13 NOTE — Progress Notes (Signed)
 "   Inpatient Progress Note     Patient Profile/Chief Complaint  63 year old female with a PMH of gastric ulcers (Forrest III), duodenal ulcers (Forrest III), duodenal AVMs, HTN, hyperlipidemia, and GERD transferred from [facility] for IR embolization of source of bleeding source at hepatic flexure on CTA 05/07/2024  Status post IR embolization 05/08/2024 with 3 coils placed into SMA supplying area of right colon hepatic flexure angiodysplasia with mild arterial bleeding  Initially stable status post embolization but subsequently developed recurrent hematochezia  CTA 05/11/2024 -active extravasation at hepatic flexure  Colonoscopy 05/12/2024 -1 cm ulcer at hepatic flexure status post 2 hemoclips   Interval History   - Denies further bleeding since colonoscopy yesterday - Hemoglobin stable at 7.1 - Reported abdominal discomfort with regular diet today   Objective   Vital signs in last 24 hours: Temp:  [97 F (36.1 C)-98.7 F (37.1 C)] 98.7 F (37.1 C) (12/24 1200) Pulse Rate:  [60-92] 85 (12/24 1400) Resp:  [9-30] 15 (12/24 0800) BP: (102-132)/(32-77) 130/66 (12/24 1300) SpO2:  [94 %-100 %] 100 % (12/24 1400) Last BM Date : 05/12/24 General:    Resting quietly on the edge of the bed in no distress Heart:  Regular rate and rhythm; no murmurs Lungs: Respirations even and unlabored, lungs CTA bilaterally Abdomen:  Soft, mild tenderness palpation in upper abdomen and nondistended. Normal bowel sounds. Extremities:  Without edema. Neurologic:  Alert and oriented,  grossly normal neurologically. Psych:  Cooperative. Normal mood and affect.  Intake/Output from previous day: 12/23 0701 - 12/24 0700 In: 802 [I.V.:500; Blood:302] Out: -  Intake/Output this shift: Total I/O In: 403 [P.O.:400; I.V.:3] Out: -   Lab Results: Recent Labs    05/11/24 0538 05/11/24 0731 05/11/24 1253 05/13/24 0328 05/13/24 0813 05/13/24 0934  WBC 13.5*  --   --   --  13.3*  --   HGB 6.1*  --     < > 7.1* 7.1* 7.1*  HCT 17.5*  --    < > 20.4* 20.8* 20.7*  PLT 85* 88*  --   --  145*  --    < > = values in this interval not displayed.   BMET Recent Labs    05/11/24 0538 05/13/24 0813  NA 132* 133*  K 4.5 4.1  CL 104 103  CO2 25 26  GLUCOSE 171* 184*  BUN 19 8  CREATININE 0.79 0.68  CALCIUM  7.2* 7.3*   LFT Recent Labs    05/10/24 1830  PROT 3.7*  ALBUMIN 2.1*  AST 22  ALT 7  ALKPHOS 41  BILITOT 0.3  BILIDIR <0.1  IBILI NOT CALCULATED   PT/INR Recent Labs    05/11/24 0731  LABPROT 14.0  INR 1.0    Studies/Results: No results found.   Endoscopic Studies: EGD/colonoscopy/small bowel endoscopy 03/2024 reviewed in epic  Colonoscopy 05/12/2024 - 1 cm ulcer at hepatic flexure status post 2 hemoclips    Clinical Impression   63 year old female with a PMH of gastric ulcers (Forrest III), duodenal ulcers (Forrest III), duodenal AVMs, HTN, hyperlipidemia, and GERD transferred from [facility] for IR embolization of source of bleeding source at hepatic flexure on CTA 05/07/2024  Status post IR embolization 05/08/2024 with 3 coils placed into SMA supplying area of right colon hepatic flexure angiodysplasia with mild arterial bleeding.  After embolization of right colon, patient developed recurrent hematochezia.  CTA 05/11/2024 showed active extravasation at the hepatic flexure.  Colonoscopy 05/12/2024 - 1 cm  ulcer at hepatic flexure status post 2 hemoclips  Patient has been stable since colonoscopy yesterday without further overt bleeding.  Hemoglobin stable at 7.3.  Advance to regular diet today but developed abdominal pain.  She is going to try to focus on liquids and softer foods.    Plan  Continue to trend serial hemoglobin and hematocrit Monitor abdominal exam given symptoms of discomfort Continue soft/regular diet If recurrent bleeding may require reevaluation by IR versus surgical intervention  In the outpatient setting, patient is followed by  [facility] -Dr. Omar Hasty is her primary gastroenterologist.  Outpatient follow-up at the time of hospital discharge can be coordinated with their group.  GI team will sign off at this time.  Please reconsult our service for any questions or concerns.   LOS: 7 days   Inocente CHRISTELLA Hausen  05/13/2024, 2:15 PM  Inocente Hausen, MD [facility]  "

## 2024-05-14 ENCOUNTER — Inpatient Hospital Stay (HOSPITAL_COMMUNITY)

## 2024-05-14 ENCOUNTER — Encounter (HOSPITAL_COMMUNITY): Payer: Self-pay | Admitting: Internal Medicine

## 2024-05-14 DIAGNOSIS — F419 Anxiety disorder, unspecified: Secondary | ICD-10-CM

## 2024-05-14 HISTORY — PX: IR US GUIDE VASC ACCESS RIGHT: IMG2390

## 2024-05-14 HISTORY — PX: IR ANGIOGRAM SELECTIVE EACH ADDITIONAL VESSEL: IMG667

## 2024-05-14 HISTORY — PX: IR ANGIOGRAM VISCERAL SELECTIVE: IMG657

## 2024-05-14 HISTORY — PX: IR EMBO ART  VEN HEMORR LYMPH EXTRAV  INC GUIDE ROADMAPPING: IMG5450

## 2024-05-14 LAB — GLUCOSE, CAPILLARY
Glucose-Capillary: 220 mg/dL — ABNORMAL HIGH (ref 70–99)
Glucose-Capillary: 203 mg/dL — ABNORMAL HIGH (ref 70–99)
Glucose-Capillary: 197 mg/dL — ABNORMAL HIGH (ref 70–99)

## 2024-05-14 LAB — GLOBAL TEG PANEL
CFF Max Amplitude: 17.5 mm (ref 15–32)
CK with Heparinase (R): 6.4 min (ref 4.3–8.3)
Citrated Functional Fibrinogen: 319.3 mg/dL (ref 278–581)
Citrated Kaolin (K): 1.1 min (ref 0.8–2.1)
Citrated Kaolin (MA): 63.8 mm (ref 52–69)
Citrated Kaolin (R): 5.5 min (ref 4.6–9.1)
Citrated Kaolin Angle: 73.9 deg (ref 63–78)
Citrated Rapid TEG (MA): 63.1 mm (ref 52–70)

## 2024-05-14 LAB — HEMOGLOBIN AND HEMATOCRIT, BLOOD
HCT: 17 % — ABNORMAL LOW (ref 36.0–46.0)
HCT: 19 % — ABNORMAL LOW (ref 36.0–46.0)
HCT: 19.6 % — ABNORMAL LOW (ref 36.0–46.0)
HCT: 22.6 % — ABNORMAL LOW (ref 36.0–46.0)
HCT: 23.8 % — ABNORMAL LOW (ref 36.0–46.0)
Hemoglobin: 5.9 g/dL — CL (ref 12.0–15.0)
Hemoglobin: 6.7 g/dL — CL (ref 12.0–15.0)
Hemoglobin: 7 g/dL — ABNORMAL LOW (ref 12.0–15.0)
Hemoglobin: 7.9 g/dL — ABNORMAL LOW (ref 12.0–15.0)
Hemoglobin: 8.3 g/dL — ABNORMAL LOW (ref 12.0–15.0)

## 2024-05-14 LAB — PREPARE RBC (CROSSMATCH)

## 2024-05-14 LAB — PHOSPHORUS: Phosphorus: 2.7 mg/dL (ref 2.5–4.6)

## 2024-05-14 LAB — MAGNESIUM: Magnesium: 1.7 mg/dL (ref 1.7–2.4)

## 2024-05-14 MED ORDER — MIDAZOLAM HCL (PF) 2 MG/2ML IJ SOLN
INTRAMUSCULAR | Status: AC | PRN
  Administered 2024-05-14: 1 mg via INTRAVENOUS
  Administered 2024-05-14: .5 mg via INTRAVENOUS

## 2024-05-14 MED ORDER — SODIUM CHLORIDE 0.9% IV SOLUTION: Freq: Once | INTRAVENOUS | Status: DC

## 2024-05-14 MED ORDER — FENTANYL CITRATE (PF) 100 MCG/2ML IJ SOLN
INTRAMUSCULAR | Status: AC
  Filled 2024-05-14: qty 2

## 2024-05-14 MED ORDER — LIDOCAINE HCL 1 % IJ SOLN
INTRAMUSCULAR | Status: AC
  Filled 2024-05-14: qty 20

## 2024-05-14 MED ORDER — FENTANYL CITRATE (PF) 100 MCG/2ML IJ SOLN
INTRAMUSCULAR | Status: AC | PRN
  Administered 2024-05-14: 50 ug via INTRAVENOUS

## 2024-05-14 MED ORDER — IOHEXOL 350 MG/ML SOLN
75.0000 mL | Freq: Once | INTRAVENOUS | Status: AC | PRN
  Administered 2024-05-14: 75 mL via INTRAVENOUS

## 2024-05-14 MED ORDER — MIDAZOLAM HCL 2 MG/2ML IJ SOLN
INTRAMUSCULAR | Status: AC
  Filled 2024-05-14: qty 2

## 2024-05-14 MED ORDER — INSULIN GLARGINE 100 UNIT/ML ~~LOC~~ SOLN
10.0000 [IU] | Freq: Every day | SUBCUTANEOUS | Status: DC
  Filled 2024-05-14: qty 0.1

## 2024-05-14 MED ORDER — LIDOCAINE HCL 1 % IJ SOLN
20.0000 mL | Freq: Once | INTRAMUSCULAR | Status: AC
  Administered 2024-05-14: 7 mL

## 2024-05-14 MED ORDER — SODIUM CHLORIDE 0.9% IV SOLUTION: Freq: Once | INTRAVENOUS | Status: AC

## 2024-05-14 MED ORDER — ALPRAZOLAM 0.25 MG PO TABS
0.2500 mg | ORAL_TABLET | Freq: Two times a day (BID) | ORAL | Status: DC | PRN
  Administered 2024-05-15 – 2024-05-19 (×6): 0.25 mg via ORAL
  Filled 2024-05-14 (×3): qty 1

## 2024-05-14 MED ORDER — IOHEXOL 300 MG/ML  SOLN
100.0000 mL | Freq: Once | INTRAMUSCULAR | Status: AC | PRN
  Administered 2024-05-14: 65 mL via INTRA_ARTERIAL

## 2024-05-14 MED ORDER — MAGNESIUM SULFATE 2 GM/50ML IV SOLN
2.0000 g | Freq: Once | INTRAVENOUS | Status: AC
  Administered 2024-05-14: 2 g via INTRAVENOUS
  Filled 2024-05-14: qty 50

## 2024-05-14 MED ORDER — IOHEXOL 300 MG/ML  SOLN
100.0000 mL | Freq: Once | INTRAMUSCULAR | Status: AC | PRN
  Administered 2024-05-14: 20 mL via INTRA_ARTERIAL

## 2024-05-14 NOTE — Consult Note (Signed)
 "     Chief Complaint: Patient was seen in consultation today for recurrent LGIB Chief Complaint  Patient presents with   Hematochezia   at the request of Paola Bolk   Referring Physician(s): Paola Bolk   Supervising Physician: Vanice Revel  Patient Status: [facility] - In-pt  History of Present Illness: Casey Johnston is a 63 y.o. 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   Patient was recently admitted at [facility] in November due to anemia and melena, received multiple units of blood and underwent EGD with argon plasma coagulation on 04/05/24.  Patient returned to [facility] ED on 12/17 with hypotension, hgb 7.8 and FOB was positive. While admitted at [facility] experienced a significant Hgb drop to 5.7 with hemodynamic instability. CT A A/P showed active GI bleeding at the hepatic flexures. Patient was transferred to [facility] overnight, IR was consulted angiogram with possible intervention. Case reviewed and approved by Dr. Vanice, she underwent successful microcoil embo of right colic branches at the site of rt colon bleeding.  Patient unfortunately continue to show drop in hgb, she underwent colonoscopy on 12/23 which showed blood in the entire colon and ulcer at the embolization site, two clips were placed. Overnight patient again had drop in hgb to 5.9, CTA A/P showed  active GI bleeding into the hepatic flexure adjacent to embolization coils. IR was consulted for repeat embo, Dr. Vanice discussed with general surgery and will proceed with the repeat embo.   Patient laying in bed, not in acute distress. States that she is anxious.  Had blood BM this morning,  Denise headache, fever, chills, shortness of breath, cough, chest pain, abdominal pain,  nausea ,vomiting.    Past Medical History:  Diagnosis Date   Anxiety and depression    Chronic constipation    Diabetes mellitus, type II (HCC)    Dr. Lenis managing   Frequent headaches chronic   ibuprofen helps some   GERD (gastroesophageal reflux disease)    History of UTI    Recurrent per pt (approx 3 per year)   Hyperlipidemia    Hypertension    Leukocytosis 12/2015   mild lymphocytosis.  Path smear review reassuring.  Repeat 01/2016 stable.   Morbid obesity (HCC)    OSA on CPAP    not using CPAP anymore    Past Surgical History:  Procedure Laterality Date   ABDOMINAL HYSTERECTOMY  1998   Dysfunctional uterine bleeding.  No hx of abnormal paps.   BIOPSY  10/20/2018   Procedure: BIOPSY;  Surgeon: Harvey Margo CROME, MD;  [facility]: [facility];  Service: Endoscopy;;  stomach   CATARACT EXTRACTION W/PHACO Left 08/24/2022   Procedure: CATARACT EXTRACTION PHACO AND INTRAOCULAR LENS PLACEMENT (IOC);  Surgeon: Harrie Agent, MD;  Location: [facility];  Service: Ophthalmology;  Laterality: Left;  CDE 8.53   CATARACT EXTRACTION W/PHACO Right 09/07/2022   Procedure: CATARACT EXTRACTION PHACO AND INTRAOCULAR LENS PLACEMENT (IOC);  Surgeon: Harrie Agent, MD;  Location: [facility];  Service: Ophthalmology;  Laterality: Right;  CDE: 10.02   COLONOSCOPY  2012   Dr. Gaylia: 1.5 cm pendunculated descending colon polyp (tubulovillous adenoma) and diminutive hyperplastic polyps   COLONOSCOPY N/A 10/20/2018   Procedure: COLONOSCOPY;  Surgeon: Harvey Margo CROME, MD; 4 tubular adenomas, diverticulosis in  the sigmoid colon, tortuous left colon, external and internal hemorrhoids. Due for repeat in 2023.    COLONOSCOPY N/A 04/07/2024   Procedure: COLONOSCOPY;  Surgeon: Eartha Angelia Sieving, MD;  [facility]: [facility];  Service: Gastroenterology;  Laterality: N/A;   COLONOSCOPY WITH PROPOFOL  N/A 12/24/2022   Procedure: COLONOSCOPY WITH PROPOFOL ;  Surgeon: Cindie Carlin POUR, DO;  Location: [facility];   Service: Endoscopy;  Laterality: N/A;  11:15 am, asa 3   ENTEROSCOPY N/A 04/11/2024   Procedure: ENTEROSCOPY;  Surgeon: Cindie Carlin POUR, DO;  [facility];  Service: Endoscopy;  Laterality: N/A;   ESOPHAGOGASTRODUODENOSCOPY N/A 10/20/2018   Procedure: ESOPHAGOGASTRODUODENOSCOPY (EGD);  Surgeon: Harvey Margo CROME, MD;  gastritis and duodenitis. No H. Pylori.    ESOPHAGOGASTRODUODENOSCOPY N/A 04/07/2024   Procedure: EGD (ESOPHAGOGASTRODUODENOSCOPY);  Surgeon: Eartha Angelia, Sieving, MD;  [facility];  Service: Gastroenterology;  Laterality: N/A;  WITH ENTEROSCOPY   ESOPHAGOGASTRODUODENOSCOPY N/A 04/05/2024   Procedure: EGD (ESOPHAGOGASTRODUODENOSCOPY);  Surgeon: Cinderella Deatrice FALCON, MD;  [facility];  Service: Endoscopy;  Laterality: N/A;   GIVENS CAPSULE STUDY N/A 04/09/2024   Procedure: IMAGING PROCEDURE, GI TRACT, INTRALUMINAL, VIA CAPSULE;  Surgeon: Cinderella Deatrice FALCON, MD;  [facility];  Service: Endoscopy;  Laterality: N/A;   IR ANGIOGRAM SELECTIVE EACH ADDITIONAL VESSEL  05/08/2024   IR ANGIOGRAM SELECTIVE EACH ADDITIONAL VESSEL  05/08/2024   IR ANGIOGRAM SELECTIVE EACH ADDITIONAL VESSEL  05/08/2024   IR ANGIOGRAM VISCERAL SELECTIVE  05/08/2024   IR EMBO ART  VEN HEMORR LYMPH EXTRAV  INC GUIDE ROADMAPPING  05/08/2024   IR US  GUIDE VASC ACCESS RIGHT  05/08/2024   POLYPECTOMY  10/20/2018   Procedure: POLYPECTOMY;  Surgeon: Harvey Margo CROME, MD;  [facility];  Service: Endoscopy;;   POLYPECTOMY  12/24/2022   Procedure: POLYPECTOMY INTESTINAL;  Surgeon: Cindie Carlin POUR, DO;  [facility];  Service: Endoscopy;;   RADIOLOGY WITH ANESTHESIA N/A 05/08/2024   Procedure: RADIOLOGY WITH ANESTHESIA;  Surgeon: Radiologist, Medication, MD;  Location: [facility];  Service: Radiology;  Laterality: N/A;   wart removal  08/04/2019   benign    Allergies: Januvia  [sitagliptin ] and Amlodipine   Medications: Prior to Admission medications   Medication Sig Start Date End Date Taking? Authorizing Provider  atorvastatin  (LIPITOR) 20 MG tablet TAKE 1 Tablet BY MOUTH ONCE EVERY DAY Patient taking differently: Take 20 mg by mouth daily. TAKE 1 Tablet BY MOUTH ONCE EVERY DAY 10/05/21  Yes Comer Kirsch, PA-C  cloNIDine  (CATAPRES ) 0.1 MG tablet Take 1 tablet (0.1 mg total) by mouth 2 (two) times daily. Patient taking differently: Take 0.1 mg by mouth daily. 10/05/21  Yes Comer Kirsch, PA-C  Dulaglutide (TRULICITY) 0.75 MG/0.5ML SOPN Inject 0.75 mg into the skin every Monday.   Yes [provider]  FEROSUL 325 (65 Fe) MG tablet Take 325 mg by mouth daily. 04/24/24  Yes [provider]  linaclotide  (LINZESS ) 72 MCG capsule Take 72 mcg by mouth daily as needed (constipation).   Yes [provider]  losartan -hydrochlorothiazide  (HYZAAR) 100-12.5 MG tablet Take 1 tablet by mouth daily. 11/09/22  Yes [provider]  metFORMIN  (GLUCOPHAGE ) 1000 MG tablet Take 1 tablet (1,000 mg total) by mouth 2 (two) times daily with a meal. 10/05/21  Yes Comer Kirsch, PA-C  pantoprazole  (PROTONIX ) 40 MG tablet Take 1 tablet (40 mg total) by mouth 2 (two) times daily before a meal. 04/12/24  Yes Sigdel, Santosh, MD  glipiZIDE  (GLUCOTROL  XL) 5 MG  24 hr tablet Take 1 tablet (5 mg total) by mouth daily with breakfast. 10/18/22   Nida, Ethelle ORN, MD     Family History  Problem Relation Age of Onset   Cancer Mother        ovarian   COPD Father    Diabetes Father    Hyperlipidemia Father    Heart attack Father    Heart failure Father    Colon cancer Neg Hx     Social History   Socioeconomic History   Marital status: Divorced    Spouse name: Not on file   Number of children: Not on file   Years of education: Not on file   Highest education level: Not on file  Occupational History   Not on file  Tobacco Use   Smoking status: Every Day    Current packs/day: 0.25    Average packs/day: 0.3 packs/day for  20.0 years (5.0 ttl pk-yrs)    Types: Cigarettes   Smokeless tobacco: Never  Vaping Use   Vaping status: Never Used  Substance and Sexual Activity   Alcohol use: No   Drug use: No   Sexual activity: Not on file  Other Topics Concern   Not on file  Social History Narrative   Separated x 20 yrs, one daughter.  She lives with her.  Great niece and nephew live with her as well.   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   Tob: 7 pack year hx, quit 2010.   Alc: none   Exercise: none   Social Drivers of Health   Tobacco Use: High Risk (05/14/2024)   Patient History    Smoking Tobacco Use: Every Day    Smokeless Tobacco Use: Never    Passive Exposure: Not on file  Financial Resource Strain: Low Risk (03/26/2023)   Received from [facility]   Overall Financial Resource Strain (CARDIA)    Difficulty of Paying Living Expenses: Not hard at all  Food Insecurity: No Food Insecurity (05/06/2024)   Epic    Worried About Radiation Protection Practitioner of Food in the Last Year: Never true    Ran Out of Food in the Last Year: Never true  Transportation Needs: No Transportation Needs (05/06/2024)   Epic    Lack of Transportation (Medical): No    Lack of Transportation (Non-Medical): No  Physical Activity: Sufficiently Active (03/26/2023)   Received from [facility]   Exercise Vital Sign    On average, how many days per week do you engage in moderate to strenuous exercise (like a brisk walk)?: 6 days    On average, how many minutes do you engage in exercise at this level?: 30 min  Stress: Stress Concern Present (03/26/2023)   Received from [facility] - Occupational Stress Questionnaire    Feeling of Stress : To some extent  Social Connections: Moderately Isolated (03/26/2023)   Received from [facility]   Social Connection and Isolation Panel    In a typical week, how many times do you talk on the phone with family, friends, or neighbors?: Once a week     How often do you get together with friends or relatives?: Three times a week    How often do you attend church or religious services?: More than 4 times per year    Do you belong to any clubs or organizations such as church groups, unions, fraternal or athletic groups, or school groups?: No  How often do you attend meetings of the clubs or organizations you belong to?: Never    Are you married, widowed, divorced, separated, never married, or living with a partner?: Divorced  Depression (PHQ2-9): Not on file  Alcohol Screen: Not on file  Housing: Low Risk (05/06/2024)   Epic    Unable to Pay for Housing in the Last Year: No    Number of Times Moved in the Last Year: 0    Homeless in the Last Year: No  Utilities: Not At Risk (05/06/2024)   Epic    Threatened with loss of utilities: No  Health Literacy: Low Risk (03/26/2023)   Received from [facility]    How often do you need to have someone help you when you read instructions, pamphlets, or other written material from your doctor or pharmacy?: Never     Review of Systems: A 12 point ROS discussed and pertinent positives are indicated in the HPI above.  All other systems are negative.  Vital Signs: BP (!) 126/46   Pulse 72   Temp 98.5 F (36.9 C)   Resp 14   Ht 5' 5 (1.651 m)   Wt 195 lb (88.5 kg)   SpO2 97%   BMI 32.45 kg/m    Physical Exam Vitals reviewed.  Constitutional:      General: She is not in acute distress.    Appearance: She is not ill-appearing.  HENT:     Head: Normocephalic and atraumatic.     Mouth/Throat:     Mouth: Mucous membranes are moist.     Pharynx: Oropharynx is clear.  Cardiovascular:     Rate and Rhythm: Normal rate and regular rhythm.  Pulmonary:     Effort: Pulmonary effort is normal.     Breath sounds: Normal breath sounds.  Abdominal:     General: Abdomen is flat. Bowel sounds are normal.     Palpations: Abdomen is soft.  Musculoskeletal:        General:  No swelling or tenderness.     Cervical back: Neck supple.  Skin:    General: Skin is warm and dry.     Coloration: Skin is not jaundiced or pale.  Neurological:     Mental Status: She is alert and oriented to person, place, and time.  Psychiatric:        Mood and Affect: Mood normal.        Behavior: Behavior normal.        Judgment: Judgment normal.     MD Evaluation Airway: WNL Heart: WNL Abdomen: WNL Chest/ Lungs: WNL ASA  Classification: 3 Mallampati/Airway Score: Two  Imaging: CT Angio Abd/Pel w/ and/or w/o Result Date: 05/14/2024 EXAM: CTA ABDOMEN AND PELVIS WITH AND WITHOUT CONTRAST 05/14/2024 05:19:00 AM TECHNIQUE: CTA images of the abdomen and pelvis with and without intravenous contrast. Three-dimensional MIP/volume rendered formations were performed. Automated exposure control, iterative reconstruction, and/or weight based adjustment of the mA/kV was utilized to reduce the radiation dose to as low as reasonably achievable. 75 mL (iohexol  (OMNIPAQUE ) 350 MG/ML injection 75 mL IOHEXOL  350 MG/ML SOLN) was administered. COMPARISON: CTA abdomen and pelvis, 2 studies recently dated 05/11/2024 and 05/09/2024. CLINICAL HISTORY: Lower GI bleed with active bleeding at the hepatic flexure on the last CTA abdomen and pelvis. FINDINGS: VASCULATURE: GI BLEED: Active bleeding is again noted into the hepatic flexure just below embolization coils of SMA branches to the hepatic flexure. The active hemorrhage is best seen on  axial series 7 images 97 through 99, axial series 10 images 94 through 100, and coronal reconstruction series 19 image 90. No other active GI bleeding is seen on exam. AORTA: Minimal aortic calcific plaques without an aneurysm, dissection, or stenosis. CELIAC TRUNK: The celiac trunk is widely patent. There are nonstenosing ostial calcific plaques. SUPERIOR MESENTERIC ARTERY: The superior mesenteric artery is widely patent. There are nonstenosing ostial calcific plaques.  INFERIOR MESENTERIC ARTERY: No acute finding. No occlusion or significant stenosis. RENAL ARTERIES: No acute finding. No occlusion or significant stenosis. ILIAC ARTERIES: The bilateral iliac arteries are patent with no occlusion or significant stenosis. There are trace calcifications in the common and the aortic arteries. More prominent patchy calcific plaques in the internal iliac arteries. No significant disease in the external iliac arteries. ABDOMEN/PELVIS: LOWER CHEST: Coarse atelectatic changes at the lower lobes. Lung bases are clear of infiltrates. Decreased density of the cardiac blood pool consistent with anemia. Mild cardiomegaly. LIVER: The liver is unremarkable. GALLBLADDER AND BILE DUCTS: Gallbladder is unremarkable. No biliary ductal dilatation. SPLEEN: The spleen is unremarkable. PANCREAS: The pancreas is unremarkable. ADRENAL GLANDS: Bilateral adrenal glands demonstrate no acute abnormality. No adrenal mass. KIDNEYS, URETERS AND BLADDER: No stones in the kidneys or ureters. No hydronephrosis. No perinephric or periureteral stranding. Urinary bladder is unremarkable. No renal mass. 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 GI AND BOWEL: There is increased generalized fold thickening in the stomach. There are increased perigastric edematous changes. Findings could be from simple gastritis or inflammatory peptic ulcer disease, but whatever the etiology, this was not seen previously. No gastric pneumatosis or portal venous gas is seen. The small bowel is of normal caliber without inflammatory change. The appendix is unremarkable. As above, embolization coils are noted medial to the hepatic flexure with adjacent active GI bleed into the hepatic flexure which was seen 3 days ago. The large bowel wall is of normal thickness. There is sigmoid diverticulosis without evidence of diverticulitis. There is no bowel obstruction. No abnormal bowel wall thickening or distension. REPRODUCTIVE: The  uterus is surgically absent. No adnexal mass. PERITONEUM AND RETROPERITONEUM: There is a small amount of free ascites around the liver and in the pelvis. No free hemorrhage or free air is seen. No incarcerated hernia. Small fat-containing umbilical hernia. LYMPH NODES: No lymphadenopathy. 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 No acute or significant osseous findings. There is increased body wall anasarca. IMPRESSION: 1. Active GI bleeding into the hepatic flexure adjacent to embolization coils, as seen on prior study 3 days ago. No other active GI bleeding. 2. Increased generalized fold thickening in the stomach and increased perigastric edematous changes, not seen previously. Findings could be from gastritis or inflammatory peptic ulcer disease. 3. . Small volume of increased ascites, with increased body wall anasarca. 4. Bosniak 1 and 2 renal cysts. Electronically signed by: Francis Quam MD 05/14/2024 06:02 AM EST RP Workstation: [id number]   CT ANGIO GI BLEED Result Date: 05/11/2024 EXAM: CTA ABDOMEN AND PELVIS WITH CONTRAST 05/09/2024 TECHNIQUE: CTA images of the abdomen and pelvis with intravenous contrast. 75 mL iohexol  (OMNIPAQUE ) 350 MG/ML injection. Three-dimensional MIP/volume rendered formations were performed. Automated exposure control, iterative reconstruction, and/or weight based adjustment of the mA/kV was utilized to reduce the radiation dose to as low as reasonably achievable. COMPARISON: CT angiogram of the abdomen and pelvis dated 05/09/2024. CLINICAL HISTORY: Lower GI bleed (Ped 0-17y). FINDINGS: VASCULATURE: GI BLEED: There  is some active hemorrhage within the hepatic flexure seen on image 116 of series 5 and image 81 of coronal series 14. AORTA: No acute finding. No abdominal aortic aneurysm. No dissection. CELIAC TRUNK: No acute finding. No occlusion or significant stenosis. SUPERIOR MESENTERIC ARTERY: No acute  finding. No occlusion or significant stenosis. INFERIOR MESENTERIC ARTERY: No acute finding. No occlusion or significant stenosis. RENAL ARTERIES: No acute finding. No occlusion or significant stenosis. ILIAC ARTERIES: No acute finding. No occlusion or significant stenosis. ABDOMEN/PELVIS: LOWER CHEST: There are streaky peribronchovascular opacities again demonstrated within the lower lobes bilaterally, slightly worse on the left, as before. LIVER: The liver is unremarkable. GALLBLADDER AND BILE DUCTS: There is vicarious excretion within the gallbladder. No biliary ductal dilatation. SPLEEN: The spleen is unremarkable. PANCREAS: The pancreas is unremarkable. ADRENAL GLANDS: Bilateral adrenal glands demonstrate no acute abnormality. KIDNEYS, URETERS AND BLADDER: There are a couple of simple cysts again seen arising from the right kidney and a small simple cyst also on the left. No stones in the kidneys or ureters. No hydronephrosis. No perinephric or periureteral stranding. Urinary bladder is unremarkable. GI AND BOWEL: There are embolization coils again demonstrated posterior to the hepatic flexure. There is some active hemorrhage within the hepatic flexure seen on image 116 of series 5 and image 81 of coronal series 14. There are a few scattered colonic diverticula present. Stomach and duodenal sweep demonstrate no acute abnormality. There is no bowel obstruction. No abnormal bowel wall thickening or distension. REPRODUCTIVE: The patient is status post hysterectomy and bilateral salpingo-oophorectomy. PERITONEUM AND RETROPERITONEUM: No ascites or free air. LYMPH NODES: No lymphadenopathy. BONES AND SOFT TISSUES: No acute abnormality of the bones. No acute soft tissue abnormality. IMPRESSION: 1. Active hemorrhage within the hepatic flexure. 2. Embolization coils posterior to the hepatic flexure. 3. Scattered colonic diverticula. 4. Streaky peribronchovascular opacities in the bilateral lower lobes, slightly worse on  the left, similar to prior. Electronically signed by: Evalene Coho MD 05/11/2024 05:01 AM EST RP Workstation: [id number]   CT Angio Abd/Pel w/ and/or w/o Result Date: 05/09/2024 EXAM: CTA ABDOMEN AND PELVIS WITH AND WITHOUT CONTRAST 05/09/2024 09:40:06 AM TECHNIQUE: CTA images of the abdomen and pelvis with and without intravenous contrast. Three-dimensional MIP/volume rendered formations were performed. Automated exposure control, iterative reconstruction, and/or weight based adjustment of the mA/kV was utilized to reduce the radiation dose to as low as reasonably achievable. 75 mL of iohexol  (OMNIPAQUE ) 350 MG/ML injection was administered. COMPARISON: 05/07/2024. Subsegmental atelectasis identified within both lung bases. CLINICAL HISTORY: Lower GI bleed. FINDINGS: VASCULATURE: AORTA: Mild aortic atherosclerotic calcification. No acute finding. No abdominal aortic aneurysm. No dissection. CELIAC TRUNK: Status post coil embolization branches of the superior mesenteric artery branch supplying the right colon and hepatic flexure region. No acute finding. No occlusion or significant stenosis. SUPERIOR MESENTERIC ARTERY: Status post coil embolization branches of the superior mesenteric artery branch supplying the right colon and hepatic flexure region. No acute finding. No occlusion or significant stenosis. RENAL ARTERIES: No acute finding. No occlusion or significant stenosis. ILIAC ARTERIES: No acute finding. No occlusion or significant stenosis. LIVER: The liver is unremarkable. GALLBLADDER AND BILE DUCTS: Contrast material fills the gallbladder lumen. No biliary ductal dilatation. SPLEEN: The spleen is unremarkable. PANCREAS: The pancreas is unremarkable. ADRENAL GLANDS: Bilateral adrenal glands demonstrate no acute abnormality. KIDNEYS, URETERS AND BLADDER: Bosniak class 2 right kidney cysts are unchanged. The largest arises off the lateral cortex of the right kidney measuring 4.3 cm, image 98/18. No  stones in  the kidneys or ureters. No hydronephrosis. No perinephric or periureteral stranding. Urinary bladder is unremarkable. GI AND BOWEL: Stomach and duodenal sweep demonstrate no acute abnormality. There is no bowel obstruction. There is no brisk arterial phase extravasation of contrast into the colonic lumen. There is new low attenuation wall thickening involving the ascending colon with hyperemia of the mucosa on the delayed images with trace amount of intraluminal contrast noted on the delayed images. The trace intraluminal contrast on delayed images could represent very slow or intermittent bleeding. Colonic diverticulosis without signs of acute diverticulitis. There are no signs of active intraluminal contrast extravasation to indicate recurrent GI bleeding. There is no abnormal bowel dilatation. No abnormal bowel wall thickening or distension in the remainder of the visualized bowel. REPRODUCTIVE: Status post hysterectomy. No adnexal mass. PERITONEUM AND RETRPERITONEUM: No ascites or free air. No free fluid or fluid collections identified. LUNG BASE: Subsegmental atelectasis identified within both lung bases. LYMPH NODES: No lymphadenopathy. BONES AND SOFT TISSUES: No acute abnormality of the bones. No acute soft tissue abnormality. IMPRESSION: 1. No evidence of active arterial phase  intraluminal contrast extravasation. 2. New low attenuation wall thickening involving the ascending colon with hyperemia of the mucosa and trace intraluminal contrast on delayed images in the setting of recent coil embolization of branches of the superior mesenteric artery supplying the right colon/hepatic flexure region; differential considerations include ischemic colitis, infectious colitis, or post-embolization changes. 3. Colonic diverticulosis without signs of acute diverticulitis. Electronically signed by: Waddell Calk MD 05/09/2024 10:07 AM EST RP Workstation: [id number]   IR Angiogram Visceral Selective Result  Date: 05/08/2024 INDICATION: Acute right colon lower GI bleeding by CTA, associated blood-loss anemia, requiring volume resuscitation and transfusion EXAM: ULTRASOUND GUIDANCE FOR VASCULAR ACCESS SMA, RIGHT COLIC BRANCH, MARGINAL BRANCH, AND 3 SEPARATE VASA RECTA BRANCHES CATHETERIZATIONS, ANGIOGRAMS, AND PERIPHERAL MICRO COIL EMBOLIZATION MEDICATIONS: 1% lidocaine  local. ANESTHESIA/SEDATION: MAC provided by the anesthesia service CONTRAST:  130 cc omni 300 FLUOROSCOPY: Radiation Exposure Index (as provided by the fluoroscopic device): 1,509 mGy Kerma COMPLICATIONS: None immediate. PROCEDURE: Informed consent was obtained from the patient following explanation of the procedure, risks, benefits and alternatives. The patient understands, agrees and consents for the procedure. All questions were addressed. A time out was performed prior to the initiation of the procedure. Maximal barrier sterile technique utilized including caps, mask, sterile gowns, sterile gloves, large sterile drape, hand hygiene, and Betadine  prep. Previous imaging reviewed. Under sterile conditions and local anesthesia, ultrasound micropuncture access performed of the patent right common femoral artery. Images obtained for documentation of the patent right common femoral artery. Five French sheath inserted over a Bentson guidewire. C2 catheter advanced initially utilized to select the SMA origin. SMA angiogram performed. SMA: SMA trunk and its branches are patent. Prominent right colic artery noted where there is active contrast extravasation into the hepatic flexure of the colon which correlates with the positive CTA findings. Progreat angled tip microcatheter was advanced through the C2 catheter into the SMA trunk. Fathom guidewire was utilized to select the right colic branch. Selective right colic angiogram performed. Right colic: Right colic branches widely patent as well as the marginal and Vasa recta branches to the abnormal hepatic  flexure. In the hepatic flexure, there is abnormal hypervascularity, tortuosity, and slow arterial oozing in the area of active bleeding. There are prominent early draining veins. 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 Microcatheter advanced into a supplying Vasa recta branch. Selective Vasa recta angiography performed. Right Vasa recta branch (1): Tortuous abnormal  hypervascularity and blushing noted with early draining vein compatible with a supplying branch to the area of angio dysplasia. Catheter access point is safe for peripheral micro coil embolization. There was successful deployment of terumo 2 mm microcoils within the branch ranging in size from 4 cm to 2 cm. After deployment of the microcoils, this peripheral Vasa recta branch is occluded. Repeat injection through the microcatheter demonstrates additional peripheral branch supply to the abnormal vascularity. Microcatheter was retracted and utilized to select a second right vasa recta branch more superiorly. Right Vasa recta branch (2): This branch also supplies the tortuous abnormal hypervascular area with blushing and early draining veins. Catheter access location is safe for micro coil embolization. Additional 2 mm microcoils were successfully deployed in this second branch. Following micro coil embolization, repeat angiography confirms significant reduction in the Vasa recta supply to the angio dysplasia. Repeat injection of the marginal artery demonstrates a third superior branch with abnormal vascular supply. This branch was also successfully accessed with the microcatheter peripherally. Right Vasa recta branch (3): This branch also supplies the abnormal vascularity with early draining veins. Peripheral access is also safe for micro coil embolization. This branch was occluded with 2 mm and 3 mm microcoils successfully. Final post embolization angiogram through the marginal artery demonstrates significant reduction in the arterial  supply to the area of presumed angio dysplasia. There is overall preserved marginal artery supply to the right colon. No additional large Vasa recta branch visualized supplying the abnormal vascularity. At this point the procedure was terminated. Microcatheter and C2 catheter both removed. Hemostasis obtained at the right CFA access site with the CELT device successfully. Patient tolerated the procedure well. There were no immediate complications. There was improvement in the patient's hemodynamics following the embolization. 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 This area correlated with the CTA. Electronically Signed   By: CHRISTELLA.  Shick M.D.   On: 05/08/2024 13:57   IR US  Guide Vasc Access Right Result Date: 05/08/2024 INDICATION: Acute right colon lower GI bleeding by CTA, associated blood-loss anemia, requiring volume resuscitation and transfusion EXAM: ULTRASOUND GUIDANCE FOR VASCULAR ACCESS SMA, RIGHT COLIC BRANCH, MARGINAL BRANCH, AND 3 SEPARATE VASA RECTA BRANCHES CATHETERIZATIONS, ANGIOGRAMS, AND PERIPHERAL MICRO COIL EMBOLIZATION MEDICATIONS: 1% lidocaine  local. ANESTHESIA/SEDATION: MAC provided by the anesthesia service CONTRAST:  130 cc omni 300 FLUOROSCOPY: Radiation Exposure Index (as provided by the fluoroscopic device): 1,509 mGy Kerma COMPLICATIONS: None immediate. PROCEDURE: Informed consent was obtained from the patient following explanation of the procedure, risks, benefits and alternatives. The patient understands, agrees and consents for the procedure. All questions were addressed. A time out was performed prior to the initiation of the procedure. Maximal barrier sterile technique utilized including caps, mask, sterile gowns, sterile gloves, large sterile drape, hand hygiene, and Betadine  prep. Previous imaging reviewed. Under sterile  conditions and local anesthesia, ultrasound micropuncture access performed of the patent right common femoral artery. Images obtained for documentation of the patent right common femoral artery. Five French sheath inserted over a Bentson guidewire. C2 catheter advanced initially utilized to select the SMA origin. SMA angiogram performed. SMA: SMA trunk and its branches are patent. Prominent right colic artery noted where there is active contrast extravasation into the hepatic flexure of the colon which correlates with the positive CTA findings. Progreat angled tip microcatheter was advanced through the C2 catheter into the SMA trunk.  Fathom guidewire was utilized to select the right colic branch. Selective right colic angiogram performed. Right colic: Right colic branches widely patent as well as the marginal and Vasa recta branches to the abnormal hepatic flexure. In the hepatic flexure, there is abnormal hypervascularity, tortuosity, and slow arterial oozing in the area of active bleeding. There are prominent early draining veins. 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 Microcatheter advanced into a supplying Vasa recta branch. Selective Vasa recta angiography performed. Right Vasa recta branch (1): Tortuous abnormal hypervascularity and blushing noted with early draining vein compatible with a supplying branch to the area of angio dysplasia. Catheter access point is safe for peripheral micro coil embolization. There was successful deployment of terumo 2 mm microcoils within the branch ranging in size from 4 cm to 2 cm. After deployment of the microcoils, this peripheral Vasa recta branch is occluded. Repeat injection through the microcatheter demonstrates additional peripheral branch supply to the abnormal vascularity. Microcatheter was retracted and utilized to select a second right vasa recta branch more superiorly. Right Vasa recta branch (2): This branch also supplies the tortuous abnormal  hypervascular area with blushing and early draining veins. Catheter access location is safe for micro coil embolization. Additional 2 mm microcoils were successfully deployed in this second branch. Following micro coil embolization, repeat angiography confirms significant reduction in the Vasa recta supply to the angio dysplasia. Repeat injection of the marginal artery demonstrates a third superior branch with abnormal vascular supply. This branch was also successfully accessed with the microcatheter peripherally. Right Vasa recta branch (3): This branch also supplies the abnormal vascularity with early draining veins. Peripheral access is also safe for micro coil embolization. This branch was occluded with 2 mm and 3 mm microcoils successfully. Final post embolization angiogram through the marginal artery demonstrates significant reduction in the arterial supply to the area of presumed angio dysplasia. There is overall preserved marginal artery supply to the right colon. No additional large Vasa recta branch visualized supplying the abnormal vascularity. At this point the procedure was terminated. Microcatheter and C2 catheter both removed. Hemostasis obtained at the right CFA access site with the CELT device successfully. Patient tolerated the procedure well. There were no immediate complications. There was improvement in the patient's hemodynamics following the embolization. 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 This area correlated with the CTA. Electronically Signed   By: CHRISTELLA.  Shick M.D.   On: 05/08/2024 13:57   IR Angiogram Selective Each Additional Vessel Result Date: 05/08/2024 INDICATION: Acute right colon lower GI bleeding by CTA, associated blood-loss anemia, requiring volume resuscitation and transfusion EXAM: ULTRASOUND GUIDANCE FOR VASCULAR  ACCESS SMA, RIGHT COLIC BRANCH, MARGINAL BRANCH, AND 3 SEPARATE VASA RECTA BRANCHES CATHETERIZATIONS, ANGIOGRAMS, AND PERIPHERAL MICRO COIL EMBOLIZATION MEDICATIONS: 1% lidocaine  local. ANESTHESIA/SEDATION: MAC provided by the anesthesia service CONTRAST:  130 cc omni 300 FLUOROSCOPY: Radiation Exposure Index (as provided by the fluoroscopic device): 1,509 mGy Kerma COMPLICATIONS: None immediate. PROCEDURE: Informed consent was obtained from the patient following explanation of the procedure, risks, benefits and alternatives. The patient understands, agrees and consents for the procedure. All questions were addressed. A time out was performed prior to the initiation of the procedure. Maximal barrier sterile technique utilized including caps, mask, sterile gowns, sterile gloves, large sterile drape, hand hygiene, and Betadine  prep. Previous imaging reviewed. Under sterile conditions and local anesthesia, ultrasound micropuncture  access performed of the patent right common femoral artery. Images obtained for documentation of the patent right common femoral artery. Five French sheath inserted over a Bentson guidewire. C2 catheter advanced initially utilized to select the SMA origin. SMA angiogram performed. SMA: SMA trunk and its branches are patent. Prominent right colic artery noted where there is active contrast extravasation into the hepatic flexure of the colon which correlates with the positive CTA findings. Progreat angled tip microcatheter was advanced through the C2 catheter into the SMA trunk. Fathom guidewire was utilized to select the right colic branch. Selective right colic angiogram performed. Right colic: Right colic branches widely patent as well as the marginal and Vasa recta branches to the abnormal hepatic flexure. In the hepatic flexure, there is abnormal hypervascularity, tortuosity, and slow arterial oozing in the area of active bleeding. There are prominent early draining veins. Appearance  compatible with right colon angio dysplasia. Microcatheter advanced into a supplying Vasa recta branch. Selective Vasa recta angiography performed. Right Vasa recta branch (1): Tortuous abnormal hypervascularity and blushing noted with early draining vein compatible with a supplying branch to the area of angio dysplasia. Catheter access point is safe for peripheral micro coil embolization. There was successful deployment of terumo 2 mm microcoils within the branch ranging in size from 4 cm to 2 cm. After deployment of the microcoils, this peripheral Vasa recta branch is occluded. Repeat injection through the microcatheter demonstrates additional peripheral branch supply to the abnormal vascularity. Microcatheter was retracted and utilized to select a second right vasa recta branch more superiorly. Right Vasa recta branch (2): This branch also supplies the tortuous abnormal hypervascular area with blushing and early draining veins. Catheter access location is safe for micro coil embolization. Additional 2 mm microcoils were successfully deployed in this second branch. Following micro coil embolization, repeat angiography confirms significant reduction in the Vasa recta supply to the angio dysplasia. Repeat injection of the marginal artery demonstrates a third superior branch with abnormal vascular supply. This branch was also successfully accessed with the microcatheter peripherally. Right Vasa recta branch (3): This branch also supplies the abnormal vascularity with early draining veins. Peripheral access is also safe for micro coil embolization. This branch was occluded with 2 mm and 3 mm microcoils successfully. Final post embolization angiogram through the marginal artery demonstrates significant reduction in the arterial supply to the area of presumed angio dysplasia. There is overall preserved marginal artery supply to the right colon. No additional large Vasa recta branch visualized supplying the abnormal  vascularity. At this point the procedure was terminated. Microcatheter and C2 catheter both removed. Hemostasis obtained at the right CFA access site with the CELT device successfully. Patient tolerated the procedure well. There were no immediate complications. There was improvement in the patient's hemodynamics following the embolization. 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 This area correlated with the CTA. Electronically Signed   By: CHRISTELLA.  Shick M.D.   On: 05/08/2024 13:57   IR Angiogram Selective Each Additional Vessel Result Date: 05/08/2024 INDICATION: Acute right colon lower GI bleeding by CTA, associated blood-loss anemia, requiring volume resuscitation and transfusion EXAM: ULTRASOUND GUIDANCE FOR VASCULAR ACCESS SMA, RIGHT COLIC BRANCH, MARGINAL BRANCH, AND 3 SEPARATE VASA RECTA BRANCHES CATHETERIZATIONS, ANGIOGRAMS, AND PERIPHERAL MICRO COIL EMBOLIZATION MEDICATIONS: 1% lidocaine  local. ANESTHESIA/SEDATION: MAC provided by the anesthesia service CONTRAST:  130 cc omni 300 FLUOROSCOPY:  Radiation Exposure Index (as provided by the fluoroscopic device): 1,509 mGy Kerma COMPLICATIONS: None immediate. PROCEDURE: Informed consent was obtained from the patient following explanation of the procedure, risks, benefits and alternatives. The patient understands, agrees and consents for the procedure. All questions were addressed. A time out was performed prior to the initiation of the procedure. Maximal barrier sterile technique utilized including caps, mask, sterile gowns, sterile gloves, large sterile drape, hand hygiene, and Betadine  prep. Previous imaging reviewed. Under sterile conditions and local anesthesia, ultrasound micropuncture access performed of the patent right common femoral artery. Images obtained for documentation of the patent right  common femoral artery. Five French sheath inserted over a Bentson guidewire. C2 catheter advanced initially utilized to select the SMA origin. SMA angiogram performed. SMA: SMA trunk and its branches are patent. Prominent right colic artery noted where there is active contrast extravasation into the hepatic flexure of the colon which correlates with the positive CTA findings. Progreat angled tip microcatheter was advanced through the C2 catheter into the SMA trunk. Fathom guidewire was utilized to select the right colic branch. Selective right colic angiogram performed. Right colic: Right colic branches widely patent as well as the marginal and Vasa recta branches to the abnormal hepatic flexure. In the hepatic flexure, there is abnormal hypervascularity, tortuosity, and slow arterial oozing in the area of active bleeding. There are prominent early draining veins. 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 Microcatheter advanced into a supplying Vasa recta branch. Selective Vasa recta angiography performed. Right Vasa recta branch (1): Tortuous abnormal hypervascularity and blushing noted with early draining vein compatible with a supplying branch to the area of angio dysplasia. Catheter access point is safe for peripheral micro coil embolization. There was successful deployment of terumo 2 mm microcoils within the branch ranging in size from 4 cm to 2 cm. After deployment of the microcoils, this peripheral Vasa recta branch is occluded. Repeat injection through the microcatheter demonstrates additional peripheral branch supply to the abnormal vascularity. Microcatheter was retracted and utilized to select a second right vasa recta branch more superiorly. Right Vasa recta branch (2): This branch also supplies the tortuous abnormal hypervascular area with blushing and early draining veins. Catheter access location is safe for micro coil embolization. Additional 2 mm microcoils were successfully deployed  in this second branch. Following micro coil embolization, repeat angiography confirms significant reduction in the Vasa recta supply to the angio dysplasia. Repeat injection of the marginal artery demonstrates a third superior branch with abnormal vascular supply. This branch was also successfully accessed with the microcatheter peripherally. Right Vasa recta branch (3): This branch also supplies the abnormal vascularity with early draining veins. Peripheral access is also safe for micro coil embolization. This branch was occluded with 2 mm and 3 mm microcoils successfully. Final post embolization angiogram through the marginal artery demonstrates significant reduction in the arterial supply to the area of presumed angio dysplasia. There is overall preserved marginal artery supply to the right colon. No additional large Vasa recta branch visualized supplying the abnormal vascularity. At this point the procedure was terminated. Microcatheter and C2 catheter both removed. Hemostasis obtained at the right CFA access site with the CELT device successfully. Patient tolerated the procedure well. There were no immediate complications. There was improvement in the patient's hemodynamics following the embolization. 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 This area correlated  with the CTA. Electronically Signed   By: CHRISTELLA.  Shick M.D.   On: 05/08/2024 13:57   IR EMBO ART  VEN HEMORR LYMPH EXTRAV  INC GUIDE ROADMAPPING Result Date: 05/08/2024 INDICATION: Acute right colon lower GI bleeding by CTA, associated blood-loss anemia, requiring volume resuscitation and transfusion EXAM: ULTRASOUND GUIDANCE FOR VASCULAR ACCESS SMA, RIGHT COLIC BRANCH, MARGINAL BRANCH, AND 3 SEPARATE VASA RECTA BRANCHES CATHETERIZATIONS, ANGIOGRAMS, AND PERIPHERAL MICRO COIL EMBOLIZATION  MEDICATIONS: 1% lidocaine  local. ANESTHESIA/SEDATION: MAC provided by the anesthesia service CONTRAST:  130 cc omni 300 FLUOROSCOPY: Radiation Exposure Index (as provided by the fluoroscopic device): 1,509 mGy Kerma COMPLICATIONS: None immediate. PROCEDURE: Informed consent was obtained from the patient following explanation of the procedure, risks, benefits and alternatives. The patient understands, agrees and consents for the procedure. All questions were addressed. A time out was performed prior to the initiation of the procedure. Maximal barrier sterile technique utilized including caps, mask, sterile gowns, sterile gloves, large sterile drape, hand hygiene, and Betadine  prep. Previous imaging reviewed. Under sterile conditions and local anesthesia, ultrasound micropuncture access performed of the patent right common femoral artery. Images obtained for documentation of the patent right common femoral artery. Five French sheath inserted over a Bentson guidewire. C2 catheter advanced initially utilized to select the SMA origin. SMA angiogram performed. SMA: SMA trunk and its branches are patent. Prominent right colic artery noted where there is active contrast extravasation into the hepatic flexure of the colon which correlates with the positive CTA findings. Progreat angled tip microcatheter was advanced through the C2 catheter into the SMA trunk. Fathom guidewire was utilized to select the right colic branch. Selective right colic angiogram performed. Right colic: Right colic branches widely patent as well as the marginal and Vasa recta branches to the abnormal hepatic flexure. In the hepatic flexure, there is abnormal hypervascularity, tortuosity, and slow arterial oozing in the area of active bleeding. There are prominent early draining veins. 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 Microcatheter advanced into a supplying Vasa recta branch. Selective Vasa recta angiography performed. Right Vasa  recta branch (1): Tortuous abnormal hypervascularity and blushing noted with early draining vein compatible with a supplying branch to the area of angio dysplasia. Catheter access point is safe for peripheral micro coil embolization. There was successful deployment of terumo 2 mm microcoils within the branch ranging in size from 4 cm to 2 cm. After deployment of the microcoils, this peripheral Vasa recta branch is occluded. Repeat injection through the microcatheter demonstrates additional peripheral branch supply to the abnormal vascularity. Microcatheter was retracted and utilized to select a second right vasa recta branch more superiorly. Right Vasa recta branch (2): This branch also supplies the tortuous abnormal hypervascular area with blushing and early draining veins. Catheter access location is safe for micro coil embolization. Additional 2 mm microcoils were successfully deployed in this second branch. Following micro coil embolization, repeat angiography confirms significant reduction in the Vasa recta supply to the angio dysplasia. Repeat injection of the marginal artery demonstrates a third superior branch with abnormal vascular supply. This branch was also successfully accessed with the microcatheter peripherally. Right Vasa recta branch (3): This branch also supplies the abnormal vascularity with early draining veins. Peripheral access is also safe for micro coil embolization. This branch was occluded with 2 mm and 3 mm microcoils successfully. Final post embolization angiogram through the marginal artery demonstrates significant reduction in the arterial supply to the area of presumed angio dysplasia. There is overall preserved marginal artery supply  to the right colon. No additional large Vasa recta branch visualized supplying the abnormal vascularity. At this point the procedure was terminated. Microcatheter and C2 catheter both removed. Hemostasis obtained at the right CFA access site with the CELT  device successfully. Patient tolerated the procedure well. There were no immediate complications. There was improvement in the patient's hemodynamics following the embolization. 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 This area correlated with the CTA. Electronically Signed   By: CHRISTELLA.  Shick M.D.   On: 05/08/2024 13:57   IR Angiogram Selective Each Additional Vessel Result Date: 05/08/2024 INDICATION: Acute right colon lower GI bleeding by CTA, associated blood-loss anemia, requiring volume resuscitation and transfusion EXAM: ULTRASOUND GUIDANCE FOR VASCULAR ACCESS SMA, RIGHT COLIC BRANCH, MARGINAL BRANCH, AND 3 SEPARATE VASA RECTA BRANCHES CATHETERIZATIONS, ANGIOGRAMS, AND PERIPHERAL MICRO COIL EMBOLIZATION MEDICATIONS: 1% lidocaine  local. ANESTHESIA/SEDATION: MAC provided by the anesthesia service CONTRAST:  130 cc omni 300 FLUOROSCOPY: Radiation Exposure Index (as provided by the fluoroscopic device): 1,509 mGy Kerma COMPLICATIONS: None immediate. PROCEDURE: Informed consent was obtained from the patient following explanation of the procedure, risks, benefits and alternatives. The patient understands, agrees and consents for the procedure. All questions were addressed. A time out was performed prior to the initiation of the procedure. Maximal barrier sterile technique utilized including caps, mask, sterile gowns, sterile gloves, large sterile drape, hand hygiene, and Betadine  prep. Previous imaging reviewed. Under sterile conditions and local anesthesia, ultrasound micropuncture access performed of the patent right common femoral artery. Images obtained for documentation of the patent right common femoral artery. Five French sheath inserted over a Bentson guidewire. C2 catheter advanced initially utilized to select the SMA origin. SMA angiogram performed.  SMA: SMA trunk and its branches are patent. Prominent right colic artery noted where there is active contrast extravasation into the hepatic flexure of the colon which correlates with the positive CTA findings. Progreat angled tip microcatheter was advanced through the C2 catheter into the SMA trunk. Fathom guidewire was utilized to select the right colic branch. Selective right colic angiogram performed. Right colic: Right colic branches widely patent as well as the marginal and Vasa recta branches to the abnormal hepatic flexure. In the hepatic flexure, there is abnormal hypervascularity, tortuosity, and slow arterial oozing in the area of active bleeding. There are prominent early draining veins. 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 Microcatheter advanced into a supplying Vasa recta branch. Selective Vasa recta angiography performed. Right Vasa recta branch (1): Tortuous abnormal hypervascularity and blushing noted with early draining vein compatible with a supplying branch to the area of angio dysplasia. Catheter access point is safe for peripheral micro coil embolization. There was successful deployment of terumo 2 mm microcoils within the branch ranging in size from 4 cm to 2 cm. After deployment of the microcoils, this peripheral Vasa recta branch is occluded. Repeat injection through the microcatheter demonstrates additional peripheral branch supply to the abnormal vascularity. Microcatheter was retracted and utilized to select a second right vasa recta branch more superiorly. Right Vasa recta branch (2): This branch also supplies the tortuous abnormal hypervascular area with blushing and early draining veins. Catheter access location is safe for micro coil embolization. Additional 2 mm microcoils were successfully deployed in this second branch. Following micro coil embolization, repeat angiography confirms significant reduction in the Vasa recta supply to the angio dysplasia. Repeat  injection  of the marginal artery demonstrates a third superior branch with abnormal vascular supply. This branch was also successfully accessed with the microcatheter peripherally. Right Vasa recta branch (3): This branch also supplies the abnormal vascularity with early draining veins. Peripheral access is also safe for micro coil embolization. This branch was occluded with 2 mm and 3 mm microcoils successfully. Final post embolization angiogram through the marginal artery demonstrates significant reduction in the arterial supply to the area of presumed angio dysplasia. There is overall preserved marginal artery supply to the right colon. No additional large Vasa recta branch visualized supplying the abnormal vascularity. At this point the procedure was terminated. Microcatheter and C2 catheter both removed. Hemostasis obtained at the right CFA access site with the CELT device successfully. Patient tolerated the procedure well. There were no immediate complications. There was improvement in the patient's hemodynamics following the embolization. 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 This area correlated with the CTA. Electronically Signed   By: CHRISTELLA.  Shick M.D.   On: 05/08/2024 13:57   CT Angio Abd/Pel w/ and/or w/o Addendum Date: 05/07/2024 ADDENDUM REPORT: 05/07/2024 10:57 ADDENDUM: Correction: The site of active gastrointestinal bleeding is in the hepatic flexure of the colon, not splenic. Electronically Signed   By: Lynwood Landy Raddle M.D.   On: 05/07/2024 10:57   Addendum Date: 05/07/2024 ADDENDUM REPORT: 05/07/2024 10:41 ADDENDUM: Critical Value/emergent results were called by telephone at the time of interpretation on 05/07/2024 at 10:40 am to provider Bari, charge nurse, who verbally acknowledged these results and will notify Dr. Arlon  immediately. Electronically Signed   By: Lynwood Landy Raddle M.D.   On: 05/07/2024 10:41   Result Date: 05/07/2024 CLINICAL DATA:  Lower gastrointestinal bleeding EXAM: CTA ABDOMEN AND PELVIS WITHOUT AND WITH CONTRAST TECHNIQUE: Multidetector CT imaging of the abdomen and pelvis was performed using the standard protocol during bolus administration of intravenous contrast. Multiplanar reconstructed images and MIPs were obtained and reviewed to evaluate the vascular anatomy. RADIATION DOSE REDUCTION: This exam was performed according to the departmental dose-optimization program which includes automated exposure control, adjustment of the mA and/or kV according to patient size and/or use of iterative reconstruction technique. CONTRAST:  OMNIPAQUE  IOHEXOL  350 MG/ML SOLN COMPARISON:  April 08, 2024 FINDINGS: VASCULAR Aorta: Normal caliber aorta without aneurysm, dissection, vasculitis or significant stenosis. Celiac: Patent without evidence of aneurysm, dissection, vasculitis or significant stenosis. SMA: Patent without evidence of aneurysm, dissection, vasculitis or significant stenosis. Renals: Both renal arteries are patent without evidence of aneurysm, dissection, vasculitis, fibromuscular dysplasia or significant stenosis. IMA: Patent without evidence of aneurysm, dissection, vasculitis or significant stenosis. Inflow: Patent without evidence of aneurysm, dissection, vasculitis or significant stenosis. Proximal Outflow: Bilateral common femoral and visualized portions of the superficial and profunda femoral arteries are patent without evidence of aneurysm, dissection, vasculitis or significant stenosis. Veins: No obvious venous abnormality within the limitations of this arterial phase study. Review of the MIP images confirms the above findings. NON-VASCULAR Lower chest: No acute abnormality. Hepatobiliary: No focal liver abnormality is seen. No gallstones, gallbladder wall thickening, or biliary dilatation.  Pancreas: Unremarkable. No pancreatic ductal dilatation or surrounding inflammatory changes. Spleen: Normal in size without focal abnormality. Adrenals/Urinary Tract: Adrenal glands appear normal. Stable right renal cyst. No hydronephrosis or renal obstruction noted. Urinary bladder is unremarkable. Stomach/Bowel: Stomach is within normal limits. Appendix appears normal. No  evidence of bowel wall thickening, distention, or inflammatory changes. However, there appears to be an area active contrast extravasation involving the splenic flexure of the colon consistent with active gastrointestinal bleeding. Lymphatic: No adenopathy. Reproductive: Status post hysterectomy. No adnexal masses. Other: No abdominal wall hernia or abnormality. No abdominopelvic ascites. Musculoskeletal: No acute or significant osseous findings. IMPRESSION: Active contrast extravasation is seen involving the splenic flexure of the colon consistent with active gastrointestinal bleeding. Electronically Signed: By: Lynwood Landy Raddle M.D. On: 05/07/2024 10:33   DG Abd 1 View Result Date: 04/24/2024 CLINICAL DATA:  Retained capsule.  Chronic constipation. EXAM: ABDOMEN - 1 VIEW COMPARISON:  Radiograph 04/11/2024 FINDINGS: The previous retained endoscopy capsule is no longer seen. Moderate volume of colonic stool. No bowel dilatation or evidence of obstruction. No visible radiopaque calculi. Vascular calcifications. No acute osseous findings. IMPRESSION: 1. Previous retained endoscopy capsule is no longer seen. 2. Moderate colonic stool burden. Electronically Signed   By: Andrea Gasman M.D.   On: 04/24/2024 20:36    Labs:  CBC: Recent Labs    05/09/24 1722 05/09/24 2313 05/10/24 0627 05/10/24 1341 05/11/24 0538 05/11/24 0731 05/11/24 1253 05/13/24 0813 05/13/24 0934 05/13/24 1358 05/13/24 2019 05/14/24 0128 05/14/24 0734  WBC 31.9*  --  19.4*  --  13.5*  --   --  13.3*  --   --   --   --   --   HGB 8.8*   < > 6.3*   < > 6.1*   --    < > 7.1*   < > 6.6* 7.6* 5.9* 6.7*  HCT 25.5*   < > 18.4*   < > 17.5*  --    < > 20.8*   < > 20.0* 21.1* 17.0* 19.0*  PLT 103*  --  89*  --  85* 88*  --  145*  --   --   --   --   --    < > = values in this interval not displayed.    COAGS: Recent Labs    04/04/24 0621 05/11/24 0731  INR 1.0 1.0  APTT  --  24    BMP: Recent Labs    05/09/24 0651 05/10/24 0627 05/11/24 0538 05/13/24 0813  NA 132* 131* 132* 133*  K 5.2* 4.7 4.5 4.1  CL 104 104 104 103  CO2 19* 21* 25 26  GLUCOSE 265* 164* 171* 184*  BUN 44* 31* 19 8  CALCIUM  7.1* 6.9* 7.2* 7.3*  CREATININE 1.35* 1.01* 0.79 0.68  GFRNONAA 44* >60 >60 >60    LIVER FUNCTION TESTS: Recent Labs    04/04/24 0621 05/06/24 1147 05/10/24 1830  BILITOT 0.2 0.3 0.3  AST 22 24 22   ALT 13 13 7   ALKPHOS 68 81 41  PROT 6.7 7.2 3.7*  ALBUMIN 3.8 3.9 2.1*    TUMOR MARKERS: No results for input(s): AFPTM, CEA, CA199, CHROMGRNA in the last 8760 hours.  Assessment and Plan: 63 y.o. 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   NPO since MN VSS CBC 6.7, getting blood RF yesterday wnl AC/AP: none  Allergies reviewed   The Risks and benefits of mesenteric angiogram with possible embolization were discussed with the patient including, but not limited to bleeding, infection, vascular injury, post operative pain, or contrast induced renal failure.  This procedure involves the use of X-rays and because of the nature of the  planned procedure, it is possible that we will have prolonged use of X-ray fluoroscopy.  Potential radiation risks to you include (but are not limited to) the following: - A slightly elevated risk for cancer several years later in life. This risk is typically less than 0.5% percent. This risk is low in comparison to the normal incidence of human cancer, which is 33% for women and 50% for men according to  the [employer] Society. - Radiation induced injury can include skin redness, resembling a rash, tissue breakdown / ulcers and hair loss (which can be temporary or permanent).   The likelihood of either of these occurring depends on the difficulty of the procedure and whether you are sensitive to radiation due to previous procedures, disease, or genetic conditions.   IF your procedure requires a prolonged use of radiation, you will be notified and given written instructions for further action.  It is your responsibility to monitor the irradiated area for the 2 weeks following the procedure and to notify your physician if you are concerned that you have suffered a radiation induced injury.    All of the patient's questions were answered, patient is agreeable to proceed. Consent signed and in IR.   Thank you for this interesting consult.  I greatly enjoyed meeting Merri F Penafiel and look forward to participating in their care.  A copy of this report was sent to the requesting provider on this date.  Electronically Signed: Toya VEAR Cousin, PA-C 05/14/2024, 10:45 AM   I spent a total of 40 Minutes    in face to face in clinical consultation, greater than 50% of which was counseling/coordinating care for mesenteric angiogram with possible embolization.   This chart was dictated using voice recognition software.  Despite best efforts to proofread,  errors can occur which can change the documentation meaning.   "

## 2024-05-14 NOTE — Procedures (Addendum)
 Interventional Radiology Procedure Note  Procedure: sma angio and additional peripheral vasa recta branch embo to hepatic flexure    Complications: None  Estimated Blood Loss:  min  Findings: Added a 2mm x 2cm and a 1mm x 2cm coils to the origin of one of the previously coiled branches at site of oozing which correlates with today's CTA      EMERSON FREDERIC SPECKING, MD

## 2024-05-14 NOTE — Progress Notes (Signed)
 Patient is having recurrent bleeding with hemoglobin less than 6.  Getting blood transfusion today.  Also will have IR intervention.  Discussed with Dr. Theophilus.  Patient will continue to be managed in the ICU per PCCM.

## 2024-05-14 NOTE — Progress Notes (Addendum)
 eLink Physician-Brief Progress Note Patient Name: Casey Johnston DOB: 28-Jun-1960 MRN: [id number]   Date of Service  05/14/2024  HPI/Events of Note  Notified of anemia with hgb at 5.9.   eICU Interventions  Transfuse 1 unit pRBC.     Intervention Category Intermediate Interventions: Other:  Shanda Busman 05/14/2024, 2:46 AM  4:51 AM Notified by RN that patient had a bowel movement now that is mixed black and red.  Pt dropped hgb from 7.6 to 5.9  earlier. Crea 0.68.   BP 122/70  Plan> Repeat CTA abdomen and pelvis looking for the source of bleeding.

## 2024-05-14 NOTE — Progress Notes (Signed)
 "  NAME:  Casey Johnston, MRN:  [id number], DOB:  11/16/1960, LOS: 8 ADMISSION DATE:  05/06/2024, CONSULTATION DATE:  05/08/24 REFERRING MD:  TRH, CHIEF COMPLAINT: Active GI bleed  History of Present Illness:  Casey Johnston is a 63 year old female with a past medical history significant for type 2 diabetes, HTN, HLD, OSA on no longer utilizing CPAP, GERD and anxiety who presented to the ED at [facility] 12/17 for complaints of shortness of breath and hematochezia.  Of note patient has history of gastric and small bowel AVMs with active GI bleed in the past, recent colonoscopy with diverticular disease.  Patient was admitted per hospitalist.  Patient seen with active bleed overnight 12/19 with resultant hypotension and tachycardia resulting in transfer to ICU for close monitoring  Pertinent  Medical History  Type 2 diabetes, HTN, HLD, OSA on no longer utilizing CPAP, GERD and anxiety  Significant Hospital Events: Including procedures, antibiotic start and stop dates in addition to other pertinent events   12/17 presented to [name] ED with hematochezia and shortness of breath 12/18 CT angio with active contrast extravasation involving the hepatic flexure of the colon consistent with active GI bleed > interventional radiology consulted.  Unfortunately patient remains at [facility] and was unable to undergo IR evaluation.  Patient arrived to [phone] at 1913 12/19 ongoing GI bleed overnight with hypotension and tachycardia.  Underwent embolization of right colic branches 12/20  Softer blood pressure today morning.  Had an episode of medium sized bowel movement with bright red blood.  Hemoglobin down to 6.4.  Given 2 units PRBC.  Surgery and GI consulted.  Repeat CTA done 12/23 colonoscopy. Ulcer found, clips placed. 1 PRBC for hgb dip likely reflectie of old blood seen during scope  12/24 HDS hgb stable txf to progressive 12/25 Developed bleed again overnight, Hb 5.9. CTA with continued bleed  at hepatic flexure  Interim History / Subjective:   Transfusing PRBCs.  Surgery on board.  Will get repeat embolization by IR.  Objective    Blood pressure (!) 126/46, pulse 72, temperature 98.5 F (36.9 C), resp. rate 14, height 5' 5 (1.651 m), weight 88.5 kg, SpO2 97%.        Intake/Output Summary (Last 24 hours) at 05/14/2024 1004 Last data filed at 05/14/2024 9191 Gross per 24 hour  Intake 1704.83 ml  Output --  Net 1704.83 ml   Filed Weights   05/06/24 1132 05/12/24 0935  Weight: 88.5 kg 88.5 kg    Examination: Awake, oriented No distress Heart rate regular rate and rhythm Lungs are clear Abdomen soft, positive bowel sounds Extremities with no edema  Lab/imaging reviewed Sodium 133 BUN/creatinine 8/0.68 Hemoglobin 5.9   Resolved problem list  Acute kidney injury  Assessment and Plan  Recurrent lower GIB - s/p IR embo, - s/p 2x clip hepatic flexure ulcer  R colon angiodysplasia ABLA superimposed on chronic anemia Thrombocytopenia  P: Keep NPO Continue IV PPI Will need repeat embolization by IR Surgery following in case she continues to bleed In ICU today.  Dm2  P: SSI  Add glargine 10  Hyponatremia,  mild  P Monitor  Leukocytosis, mild  -afebrile, dont think infectious  P: Observe off antibiotics  Anxiety P: As needed low-dose benzo  Signature:   The patient is critically ill with multiple organ system failure and requires high complexity decision making for assessment and support, frequent evaluation and titration of therapies, advanced monitoring, review of radiographic studies and interpretation of complex  data.   Critical Care Time devoted to patient care services, exclusive of separately billable procedures, described in this note is 35 minutes.   Uno Esau MD [facility] See Amion for pager  If no response to pager , please call [phone] until 7pm After 7:00 pm call Elink   [phone] 05/14/2024, 12:43 PM     "

## 2024-05-14 NOTE — Plan of Care (Signed)
" °  Problem: Clinical Measurements: Goal: Ability to maintain clinical measurements within normal limits will improve Outcome: Progressing Goal: Cardiovascular complication will be avoided Outcome: Progressing   Problem: Activity: Goal: Risk for activity intolerance will decrease Outcome: Progressing   Problem: Nutrition: Goal: Adequate nutrition will be maintained Outcome: Progressing   Problem: Clinical Measurements: Hemoglobin/Hematocrit Goal: Diagnostic test results will improve Outcome: Not Progressing   "

## 2024-05-14 NOTE — Progress Notes (Signed)
 "  General Surgery Follow Up Note  Subjective:    Overnight Issues:   Objective:  Vital signs for last 24 hours: Temp:  [97.4 F (36.3 C)-98.8 F (37.1 C)] 98.3 F (36.8 C) (12/25 0451) Pulse Rate:  [66-91] 77 (12/25 0600) Resp:  [10-23] 16 (12/25 0600) BP: (101-147)/(31-114) 129/42 (12/25 0600) SpO2:  [91 %-100 %] 100 % (12/25 0600)  Hemodynamic parameters for last 24 hours:    Intake/Output from previous day: 12/24 0701 - 12/25 0700 In: 1727.8 [P.O.:1040; I.V.:56.8; Blood:631] Out: -   Intake/Output this shift: Total I/O In: 583.8 [P.O.:240; I.V.:28.8; Blood:315] Out: -   Vent settings for last 24 hours:    Physical Exam:  Gen: comfortable, no distress Neuro: follows commands, alert, communicative HEENT: PERRL Neck: supple CV: RRR Pulm: unlabored breathing on RA Abd: soft, NT   GU: urine clear and yellow, +spontaneous void Extr: wwp, no edema  Results for orders placed or performed during the hospital encounter of 05/06/24 (from the past 24 hours)  Glucose, capillary     Status: Abnormal   Collection Time: 05/13/24  7:23 AM  Result Value Ref Range   Glucose-Capillary 216 (H) 70 - 99 mg/dL  Basic metabolic panel     Status: Abnormal   Collection Time: 05/13/24  8:13 AM  Result Value Ref Range   Sodium 133 (L) 135 - 145 mmol/L   Potassium 4.1 3.5 - 5.1 mmol/L   Chloride 103 98 - 111 mmol/L   CO2 26 22 - 32 mmol/L   Glucose, Bld 184 (H) 70 - 99 mg/dL   BUN 8 8 - 23 mg/dL   Creatinine, Ser 9.31 0.44 - 1.00 mg/dL   Calcium  7.3 (L) 8.9 - 10.3 mg/dL   GFR, Estimated >39 >39 mL/min   Anion gap 4 (L) 5 - 15  CBC     Status: Abnormal   Collection Time: 05/13/24  8:13 AM  Result Value Ref Range   WBC 13.3 (H) 4.0 - 10.5 K/uL   RBC 2.37 (L) 3.87 - 5.11 MIL/uL   Hemoglobin 7.1 (L) 12.0 - 15.0 g/dL   HCT 79.1 (L) 63.9 - 53.9 %   MCV 87.8 80.0 - 100.0 fL   MCH 30.0 26.0 - 34.0 pg   MCHC 34.1 30.0 - 36.0 g/dL   RDW 84.9 88.4 - 84.4 %   Platelets 145 (L) 150  - 400 K/uL   nRBC 0.2 0.0 - 0.2 %  Magnesium      Status: None   Collection Time: 05/13/24  8:13 AM  Result Value Ref Range   Magnesium  1.8 1.7 - 2.4 mg/dL  Phosphorus     Status: None   Collection Time: 05/13/24  8:13 AM  Result Value Ref Range   Phosphorus 2.7 2.5 - 4.6 mg/dL  Hemoglobin and hematocrit, blood     Status: Abnormal   Collection Time: 05/13/24  9:34 AM  Result Value Ref Range   Hemoglobin 7.1 (L) 12.0 - 15.0 g/dL   HCT 79.2 (L) 63.9 - 53.9 %  Glucose, capillary     Status: Abnormal   Collection Time: 05/13/24 11:43 AM  Result Value Ref Range   Glucose-Capillary 224 (H) 70 - 99 mg/dL  Hemoglobin and hematocrit, blood     Status: Abnormal   Collection Time: 05/13/24  1:58 PM  Result Value Ref Range   Hemoglobin 6.6 (LL) 12.0 - 15.0 g/dL   HCT 79.9 (L) 63.9 - 53.9 %  Prepare RBC (crossmatch)  Status: None   Collection Time: 05/13/24  2:46 PM  Result Value Ref Range   Order Confirmation      ORDER PROCESSED BY BLOOD BANK Performed at [facility], [address] [place], [place] [address]   Glucose, capillary     Status: Abnormal   Collection Time: 05/13/24  4:40 PM  Result Value Ref Range   Glucose-Capillary 183 (H) 70 - 99 mg/dL  Hemoglobin and hematocrit, blood     Status: Abnormal   Collection Time: 05/13/24  8:19 PM  Result Value Ref Range   Hemoglobin 7.6 (L) 12.0 - 15.0 g/dL   HCT 78.8 (L) 63.9 - 53.9 %  Glucose, capillary     Status: Abnormal   Collection Time: 05/13/24  9:32 PM  Result Value Ref Range   Glucose-Capillary 261 (H) 70 - 99 mg/dL  Hemoglobin and hematocrit, blood     Status: Abnormal   Collection Time: 05/14/24  1:28 AM  Result Value Ref Range   Hemoglobin 5.9 (LL) 12.0 - 15.0 g/dL   HCT 82.9 (L) 63.9 - 53.9 %  Magnesium      Status: None   Collection Time: 05/14/24  1:28 AM  Result Value Ref Range   Magnesium  1.7 1.7 - 2.4 mg/dL  Phosphorus     Status: None   Collection Time: 05/14/24  1:28 AM  Result Value Ref Range    Phosphorus 2.7 2.5 - 4.6 mg/dL  Prepare RBC (crossmatch)     Status: None   Collection Time: 05/14/24  2:46 AM  Result Value Ref Range   Order Confirmation      ORDER PROCESSED BY BLOOD BANK Performed at [facility], [address] [place], [place] [address]   Prepare RBC (crossmatch)     Status: None   Collection Time: 05/14/24  7:00 AM  Result Value Ref Range   Order Confirmation      ORDER PROCESSED BY BLOOD BANK Performed at [facility], [address], [place], [place] [address]     Assessment & Plan:  Present on Admission:  Upper GI bleed  Angiectasia of gastrointestinal tract  Morbid obesity (HCC)  Anemia  ABLA (acute blood loss anemia)    LOS: 8 days   Additional comments:I reviewed the patient's new clinical lab test results.   and I reviewed the patients new imaging test results.    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 Hgb 5.9 from 6.6+1pRBC yest, txf 1u pRBC by primary team, will give a second unit this AM. Repeat CTA this AM with persistent bleed. Will d/w IR repeat AE with surgery as backup if resultant ischemia.    FEN - NPO except sips/chips in anticipate of IR VTE - hold for bleeding issues   Disposition - Would prefer to avoid surgery if possible. Patient clearly has evidence of persistent bleeding and would recommend repeat AE over surgery to attempt to reduce morbidity. Will get an additional unit this AM. Send TEG. Transfuse PRN, consider iron+vitC to aid in erythropoiesis. Limit blood draws.   Casey GEANNIE Hanger, MD Trauma & General Surgery Please use [email or web address] to contact on call provider  05/14/2024  *Care during the described time interval was provided by me. I have reviewed this patient's available data, including medical history, events of note, physical examination and test results as part of my evaluation.  "

## 2024-05-15 LAB — TYPE AND SCREEN
ABO/RH(D): O POS
Antibody Screen: NEGATIVE
Unit division: 0
Unit division: 0
Unit division: 0
Unit division: 0
Unit division: 0
Unit division: 0
Unit division: 0

## 2024-05-15 LAB — BPAM RBC
Blood Product Expiration Date: 202601022359
Blood Product Expiration Date: 202601022359
Blood Product Expiration Date: 202601222359
Blood Product Expiration Date: 202601152359
Blood Product Expiration Date: 202601152359
Blood Product Expiration Date: 202601152359
Blood Product Expiration Date: 202601212359
ISSUE DATE / TIME: 202512250311
ISSUE DATE / TIME: 202512250753
ISSUE DATE / TIME: 202512251513
ISSUE DATE / TIME: 202512220724
ISSUE DATE / TIME: 202512220955
ISSUE DATE / TIME: 202512231628
ISSUE DATE / TIME: 202512241609
Unit Type and Rh: 5100
Unit Type and Rh: 5100
Unit Type and Rh: 5100
Unit Type and Rh: 5100
Unit Type and Rh: 5100
Unit Type and Rh: 5100
Unit Type and Rh: 5100

## 2024-05-15 LAB — PREPARE RBC (CROSSMATCH)

## 2024-05-15 LAB — HEMOGLOBIN AND HEMATOCRIT, BLOOD
HCT: 23.3 % — ABNORMAL LOW (ref 36.0–46.0)
HCT: 21.1 % — ABNORMAL LOW (ref 36.0–46.0)
HCT: 18.9 % — ABNORMAL LOW (ref 36.0–46.0)
HCT: 16.4 % — ABNORMAL LOW (ref 36.0–46.0)
Hemoglobin: 8.4 g/dL — ABNORMAL LOW (ref 12.0–15.0)
Hemoglobin: 7.2 g/dL — ABNORMAL LOW (ref 12.0–15.0)
Hemoglobin: 6.7 g/dL — CL (ref 12.0–15.0)
Hemoglobin: 5.6 g/dL — CL (ref 12.0–15.0)

## 2024-05-15 LAB — GLUCOSE, CAPILLARY
Glucose-Capillary: 259 mg/dL — ABNORMAL HIGH (ref 70–99)
Glucose-Capillary: 228 mg/dL — ABNORMAL HIGH (ref 70–99)
Glucose-Capillary: 295 mg/dL — ABNORMAL HIGH (ref 70–99)
Glucose-Capillary: 277 mg/dL — ABNORMAL HIGH (ref 70–99)

## 2024-05-15 MED ORDER — INSULIN GLARGINE 100 UNIT/ML ~~LOC~~ SOLN
10.0000 [IU] | Freq: Every day | SUBCUTANEOUS | Status: DC
  Administered 2024-05-15 – 2024-05-24 (×7): 10 [IU] via SUBCUTANEOUS
  Filled 2024-05-15 (×4): qty 0.1

## 2024-05-15 MED ORDER — SODIUM CHLORIDE 0.9% IV SOLUTION: Freq: Once | INTRAVENOUS | Status: DC

## 2024-05-15 NOTE — Progress Notes (Signed)
 Transfer orders were put in and bed was approved for [facility], this RN called and gave report to Roberta Gozan RN at approximately [phone] and was told we would be there shortly w the pt. During report, I stated the hgb was 6.7 and the pt would need a unit of blood that was recently ordered but not ready. I verified with Dr. Theophilus that the pt was still ok to transfer and he stated yes, as long as the BP was stable.  As BPs were stable today and WDL, I proceeded with transfer and let the 2W nurse know.  The NT on 4N[facility] took her to 2W while I was in a medical procedure with another pt at about 1630.  I received a phone call at 1637 from our unit's NT that the 2W nurse was refusing to accept the pt after being brought to the room on 2W and would not allow her to get in the new bed. I looked at our unit census via epic and noticed the pt had already been taken out of our unit census. I then spoke to [facility] RN who said they were unable to take the patient as it was a safety issue and they were not appropriately staffed, and the orders were cancelled, although the orders were not cancelled and I was not called and made aware. I verified with Doctor Mannam that it was ok to readmit pt to 4N[facility] and pt placement was called and organized the transfer back. AC was made aware.

## 2024-05-15 NOTE — Progress Notes (Signed)
 "  Trauma/Critical Care Follow Up Note  Subjective:    Overnight Issues:   Objective:  Vital signs for last 24 hours: Temp:  [97.8 F (36.6 C)-98.3 F (36.8 C)] 98.1 F (36.7 C) (12/26 1200) Pulse Rate:  [65-89] 78 (12/26 1500) Resp:  [10-27] 15 (12/26 1500) BP: (104-147)/(33-118) 118/75 (12/26 1500) SpO2:  [92 %-100 %] 100 % (12/26 1500)  Intake/Output from previous day: 12/25 0701 - 12/26 0700 In: 409.7 [I.V.:3; Blood:356.7; IV Piggyback:50] Out: -   Intake/Output this shift: Total I/O In: 720 [P.O.:720] Out: -   Vent settings for last 24 hours:    Physical Exam:  Gen: comfortable, no distress Neuro: follows commands, alert, communicative HEENT: PERRL Neck: supple CV: RRR Pulm: unlabored breathing on RA Abd: soft, NT  , +BM GU: urine clear and yellow, +spontaneous voids Extr: wwp, no edema  Results for orders placed or performed during the hospital encounter of 05/06/24 (from the past 24 hours)  Glucose, capillary     Status: Abnormal   Collection Time: 05/14/24  5:10 PM  Result Value Ref Range   Glucose-Capillary 203 (H) 70 - 99 mg/dL  Glucose, capillary     Status: Abnormal   Collection Time: 05/14/24  9:22 PM  Result Value Ref Range   Glucose-Capillary 197 (H) 70 - 99 mg/dL  Hemoglobin and hematocrit, blood     Status: Abnormal   Collection Time: 05/14/24  9:34 PM  Result Value Ref Range   Hemoglobin 8.3 (L) 12.0 - 15.0 g/dL   HCT 76.1 (L) 63.9 - 53.9 %  Hemoglobin and hematocrit, blood     Status: Abnormal   Collection Time: 05/15/24  4:06 AM  Result Value Ref Range   Hemoglobin 8.4 (L) 12.0 - 15.0 g/dL   HCT 76.6 (L) 63.9 - 53.9 %  Glucose, capillary     Status: Abnormal   Collection Time: 05/15/24  7:33 AM  Result Value Ref Range   Glucose-Capillary 259 (H) 70 - 99 mg/dL  Hemoglobin and hematocrit, blood     Status: Abnormal   Collection Time: 05/15/24 11:06 AM  Result Value Ref Range   Hemoglobin 7.2 (L) 12.0 - 15.0 g/dL   HCT 78.8 (L) 63.9  - 46.0 %  Glucose, capillary     Status: Abnormal   Collection Time: 05/15/24 11:36 AM  Result Value Ref Range   Glucose-Capillary 228 (H) 70 - 99 mg/dL  Hemoglobin and hematocrit, blood     Status: Abnormal   Collection Time: 05/15/24  2:48 PM  Result Value Ref Range   Hemoglobin 6.7 (LL) 12.0 - 15.0 g/dL   HCT 81.0 (L) 63.9 - 53.9 %  Prepare RBC (crossmatch)     Status: None   Collection Time: 05/15/24  4:30 PM  Result Value Ref Range   Order Confirmation      ORDER PROCESSED BY BLOOD BANK Performed at [facility], [address] [place], [place] [address]     Assessment & Plan:  LOS: 9 days   Additional comments:I reviewed the patient's new clinical lab test results.   and I reviewed the patients new imaging test results.    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 Hgb 5.9 from 6.6+1pRBC yest, txf 1u pRBC by primary team, will give a second unit this AM. Repeat CTA 12/25 AM with persistent bleed and underwent repeat AE. Trend hgb   FEN - diet  per primary VTE - hold for bleeding issues   Disposition - Would prefer to avoid surgery if possible. Additional unit this PM. Transfuse PRN, consider iron+vitC to aid in erythropoiesis. Limit blood draws.   Casey GEANNIE Hanger, MD Trauma & General Surgery Please use [email or web address] to contact on call provider  05/15/2024  *Care during the described time interval was provided by me. I have reviewed this patient's available data, including medical history, events of note, physical examination and test results as part of my evaluation.    "

## 2024-05-15 NOTE — Progress Notes (Signed)
 "   Referring Physician(s): Paola Bolk  Supervising Physician: Hughes Simmonds  Patient Status:  [facility] - In-pt  Chief Complaint: lower GIB s/p embolization 12/19 and 12/25 with Dr. Vanice  Subjective: Patient sitting on edge of bed. Reports feeling much better. Denies nausea.  Allergies: Januvia  [sitagliptin ] and Amlodipine   Medications: Prior to Admission medications  Medication Sig Start Date End Date Taking? Authorizing Provider  atorvastatin  (LIPITOR) 20 MG tablet TAKE 1 Tablet BY MOUTH ONCE EVERY DAY Patient taking differently: Take 20 mg by mouth daily. TAKE 1 Tablet BY MOUTH ONCE EVERY DAY 10/05/21  Yes Comer Kirsch, PA-C  cloNIDine  (CATAPRES ) 0.1 MG tablet Take 1 tablet (0.1 mg total) by mouth 2 (two) times daily. Patient taking differently: Take 0.1 mg by mouth daily. 10/05/21  Yes Comer Kirsch, PA-C  Dulaglutide (TRULICITY) 0.75 MG/0.5ML SOPN Inject 0.75 mg into the skin every Monday.   Yes [provider]  FEROSUL 325 (65 Fe) MG tablet Take 325 mg by mouth daily. 04/24/24  Yes [provider]  linaclotide  (LINZESS ) 72 MCG capsule Take 72 mcg by mouth daily as needed (constipation).   Yes [provider]  losartan -hydrochlorothiazide  (HYZAAR) 100-12.5 MG tablet Take 1 tablet by mouth daily. 11/09/22  Yes [provider]  metFORMIN  (GLUCOPHAGE ) 1000 MG tablet Take 1 tablet (1,000 mg total) by mouth 2 (two) times daily with a meal. 10/05/21  Yes Comer Kirsch, PA-C  pantoprazole  (PROTONIX ) 40 MG tablet Take 1 tablet (40 mg total) by mouth 2 (two) times daily before a meal. 04/12/24  Yes Sigdel, Santosh, MD  glipiZIDE  (GLUCOTROL  XL) 5 MG 24 hr tablet Take 1 tablet (5 mg total) by mouth daily with breakfast. 10/18/22   Lenis Ethelle ORN, MD     Vital Signs: BP (!) 133/41   Pulse 85   Temp 97.9 F (36.6 C) (Oral)   Resp 19   Ht 5' 5 (1.651 m)   Wt 195 lb (88.5 kg)   SpO2 94%   BMI 32.45 kg/m   Physical Exam Cardiovascular:      Rate and Rhythm: Normal rate.     Pulses:          Dorsalis pedis pulses are 2+ on the right side.  Pulmonary:     Effort: Pulmonary effort is normal. No respiratory distress.  Skin:    General: Skin is warm.     Comments: Right groin site- Gauze with tegaderm dressing clean, dry, and intact Site is non-tender, non-erythematous, no ecchymosis, no drainage, no sign of hematoma or infection  Neurological:     Mental Status: She is alert and oriented to person, place, and time.  Psychiatric:        Mood and Affect: Mood normal.        Behavior: Behavior normal.     Imaging: IR EMBO ART  VEN HEMORR LYMPH EXTRAV  INC GUIDE ROADMAPPING Result Date: 05/14/2024 INDICATION: Recurrent acute right colon lower GI bleeding by CTA, associated blood-loss anemia, status post prior angio embolization and endoscopic clipping EXAM: ULTRASOUND GUIDANCE FOR VASCULAR ACCESS SMA, RIGHT COLIC, MARGINAL BRANCH, AND 2 PERIPHERAL SEPARATE VASA RECTA BRANCHES CATHETERIZATIONS, ANGIOGRAMS, AND ADDITIONAL PERIPHERAL MICRO COIL EMBOLIZATION MEDICATIONS: 1% lidocaine  local. ANESTHESIA/SEDATION: Moderate (conscious) sedation was employed during this procedure. A total of Versed  1.5 mg and Fentanyl  50 mcg was administered intravenously by the radiology nurse. Total intra-service moderate Sedation Time: 60 minutes. The patient's level of consciousness and vital signs were monitored continuously by radiology nursing throughout the procedure under  my direct supervision. CONTRAST:  85 cc omni 300 FLUOROSCOPY: Radiation Exposure Index (as provided by the fluoroscopic device): 2,418 mGy Kerma COMPLICATIONS: None immediate. PROCEDURE: Informed consent was obtained from the patient following explanation of the procedure, risks, benefits and alternatives. The patient understands, agrees and consents for the procedure. All questions were addressed. A time out was performed prior to the initiation of the procedure. Maximal barrier  sterile technique utilized including caps, mask, sterile gowns, sterile gloves, large sterile drape, hand hygiene, and Betadine  prep. Previous imaging reviewed. Under sterile conditions and local anesthesia, ultrasound micropuncture access performed of the patent right common femoral artery. Images obtained for documentation of the patent right common femoral artery. Five French sheath inserted over a Bentson guidewire. C2 catheter advanced to select the SMA. Repeat SMA angiogram performed. SMA: SMA origin remains widely patent including the colic and jejunal branches. Initially no active bleeding demonstrated in the previously embolized right hepatic flexure distribution. Progreat single angle microcatheter was advanced through the C2 catheter into the SMA trunk. Fathom guidewire again utilized to select the right colic branch. Selective right colic angiogram performed. Right colic: Right colic remains patent leading into the right marginal artery. Peripheral Vasa recta branches to the hepatic flexure remain patent. Initially no active bleeding demonstrated. Microcatheter retracted and utilized to select the origin of 1 of the previously embolized 3 branches. Right vasa recta branch (1): Injection of the previously embolized branch demonstrates no active bleeding. Microcatheter retracted and utilized to select the origin of a second embolized branch. Right Vasa recta branch (2): This branch has been previously embolized however with delayed imaging, there is a rounded vascular blush distal to the coils which correlates with the CTA earlier today. Additional micro embolization: The origin of this branch was successfully embolized with the deployment of 2 additional coils (first coil was a 2 mm x 2 cm Terumo Azur coil followed by a 1 mm x 2 cm Ruby low-profile coil. This completely embolized the branch back to its origin off of the marginal artery. Final completion angiogram no longer demonstrates the abnormal  vascularity/blushing. Additional angiography within the marginal branch demonstrated no additional abnormal vascularity with preservation of the marginal artery of [place] to the right colon. At this point the microcatheter was removed. C2 catheter also removed. Hemostasis obtained at the right CFA puncture site with the CELT device. Patient tolerated the procedure well.  No immediate complication. IMPRESSION: Additional successful peripheral micro coil embolization of a right Vasa recta branch off of the SMA at the site of right hepatic flexure mild arterial oozing demonstrated by CTA earlier today. Electronically Signed   By: CHRISTELLA.  Shick M.D.   On: 05/14/2024 17:03   IR Angiogram Visceral Selective Result Date: 05/14/2024 INDICATION: Recurrent acute right colon lower GI bleeding by CTA, associated blood-loss anemia, status post prior angio embolization and endoscopic clipping EXAM: ULTRASOUND GUIDANCE FOR VASCULAR ACCESS SMA, RIGHT COLIC, MARGINAL BRANCH, AND 2 PERIPHERAL SEPARATE VASA RECTA BRANCHES CATHETERIZATIONS, ANGIOGRAMS, AND ADDITIONAL PERIPHERAL MICRO COIL EMBOLIZATION MEDICATIONS: 1% lidocaine  local. ANESTHESIA/SEDATION: Moderate (conscious) sedation was employed during this procedure. A total of Versed  1.5 mg and Fentanyl  50 mcg was administered intravenously by the radiology nurse. Total intra-service moderate Sedation Time: 60 minutes. The patient's level of consciousness and vital signs were monitored continuously by radiology nursing throughout the procedure under my direct supervision. CONTRAST:  85 cc omni 300 FLUOROSCOPY: Radiation Exposure Index (as provided by the fluoroscopic device): 2,418 mGy Kerma COMPLICATIONS: None immediate. PROCEDURE: Informed consent  was obtained from the patient following explanation of the procedure, risks, benefits and alternatives. The patient understands, agrees and consents for the procedure. All questions were addressed. A time out was performed prior to the  initiation of the procedure. Maximal barrier sterile technique utilized including caps, mask, sterile gowns, sterile gloves, large sterile drape, hand hygiene, and Betadine  prep. Previous imaging reviewed. Under sterile conditions and local anesthesia, ultrasound micropuncture access performed of the patent right common femoral artery. Images obtained for documentation of the patent right common femoral artery. Five French sheath inserted over a Bentson guidewire. C2 catheter advanced to select the SMA. Repeat SMA angiogram performed. SMA: SMA origin remains widely patent including the colic and jejunal branches. Initially no active bleeding demonstrated in the previously embolized right hepatic flexure distribution. Progreat single angle microcatheter was advanced through the C2 catheter into the SMA trunk. Fathom guidewire again utilized to select the right colic branch. Selective right colic angiogram performed. Right colic: Right colic remains patent leading into the right marginal artery. Peripheral Vasa recta branches to the hepatic flexure remain patent. Initially no active bleeding demonstrated. Microcatheter retracted and utilized to select the origin of 1 of the previously embolized 3 branches. Right vasa recta branch (1): Injection of the previously embolized branch demonstrates no active bleeding. Microcatheter retracted and utilized to select the origin of a second embolized branch. Right Vasa recta branch (2): This branch has been previously embolized however with delayed imaging, there is a rounded vascular blush distal to the coils which correlates with the CTA earlier today. Additional micro embolization: The origin of this branch was successfully embolized with the deployment of 2 additional coils (first coil was a 2 mm x 2 cm Terumo Azur coil followed by a 1 mm x 2 cm Ruby low-profile coil. This completely embolized the branch back to its origin off of the marginal artery. Final completion  angiogram no longer demonstrates the abnormal vascularity/blushing. Additional angiography within the marginal branch demonstrated no additional abnormal vascularity with preservation of the marginal artery of [place] to the right colon. At this point the microcatheter was removed. C2 catheter also removed. Hemostasis obtained at the right CFA puncture site with the CELT device. Patient tolerated the procedure well.  No immediate complication. IMPRESSION: Additional successful peripheral micro coil embolization of a right Vasa recta branch off of the SMA at the site of right hepatic flexure mild arterial oozing demonstrated by CTA earlier today. Electronically Signed   By: CHRISTELLA.  Shick M.D.   On: 05/14/2024 17:03   IR Angiogram Selective Each Additional Vessel Result Date: 05/14/2024 INDICATION: Recurrent acute right colon lower GI bleeding by CTA, associated blood-loss anemia, status post prior angio embolization and endoscopic clipping EXAM: ULTRASOUND GUIDANCE FOR VASCULAR ACCESS SMA, RIGHT COLIC, MARGINAL BRANCH, AND 2 PERIPHERAL SEPARATE VASA RECTA BRANCHES CATHETERIZATIONS, ANGIOGRAMS, AND ADDITIONAL PERIPHERAL MICRO COIL EMBOLIZATION MEDICATIONS: 1% lidocaine  local. ANESTHESIA/SEDATION: Moderate (conscious) sedation was employed during this procedure. A total of Versed  1.5 mg and Fentanyl  50 mcg was administered intravenously by the radiology nurse. Total intra-service moderate Sedation Time: 60 minutes. The patient's level of consciousness and vital signs were monitored continuously by radiology nursing throughout the procedure under my direct supervision. CONTRAST:  85 cc omni 300 FLUOROSCOPY: Radiation Exposure Index (as provided by the fluoroscopic device): 2,418 mGy Kerma COMPLICATIONS: None immediate. PROCEDURE: Informed consent was obtained from the patient following explanation of the procedure, risks, benefits and alternatives. The patient understands, agrees and consents for the procedure. All  questions  were addressed. A time out was performed prior to the initiation of the procedure. Maximal barrier sterile technique utilized including caps, mask, sterile gowns, sterile gloves, large sterile drape, hand hygiene, and Betadine  prep. Previous imaging reviewed. Under sterile conditions and local anesthesia, ultrasound micropuncture access performed of the patent right common femoral artery. Images obtained for documentation of the patent right common femoral artery. Five French sheath inserted over a Bentson guidewire. C2 catheter advanced to select the SMA. Repeat SMA angiogram performed. SMA: SMA origin remains widely patent including the colic and jejunal branches. Initially no active bleeding demonstrated in the previously embolized right hepatic flexure distribution. Progreat single angle microcatheter was advanced through the C2 catheter into the SMA trunk. Fathom guidewire again utilized to select the right colic branch. Selective right colic angiogram performed. Right colic: Right colic remains patent leading into the right marginal artery. Peripheral Vasa recta branches to the hepatic flexure remain patent. Initially no active bleeding demonstrated. Microcatheter retracted and utilized to select the origin of 1 of the previously embolized 3 branches. Right vasa recta branch (1): Injection of the previously embolized branch demonstrates no active bleeding. Microcatheter retracted and utilized to select the origin of a second embolized branch. Right Vasa recta branch (2): This branch has been previously embolized however with delayed imaging, there is a rounded vascular blush distal to the coils which correlates with the CTA earlier today. Additional micro embolization: The origin of this branch was successfully embolized with the deployment of 2 additional coils (first coil was a 2 mm x 2 cm Terumo Azur coil followed by a 1 mm x 2 cm Ruby low-profile coil. This completely embolized the branch back to  its origin off of the marginal artery. Final completion angiogram no longer demonstrates the abnormal vascularity/blushing. Additional angiography within the marginal branch demonstrated no additional abnormal vascularity with preservation of the marginal artery of [place] to the right colon. At this point the microcatheter was removed. C2 catheter also removed. Hemostasis obtained at the right CFA puncture site with the CELT device. Patient tolerated the procedure well.  No immediate complication. IMPRESSION: Additional successful peripheral micro coil embolization of a right Vasa recta branch off of the SMA at the site of right hepatic flexure mild arterial oozing demonstrated by CTA earlier today. Electronically Signed   By: CHRISTELLA.  Shick M.D.   On: 05/14/2024 17:03   IR Angiogram Selective Each Additional Vessel Result Date: 05/14/2024 INDICATION: Recurrent acute right colon lower GI bleeding by CTA, associated blood-loss anemia, status post prior angio embolization and endoscopic clipping EXAM: ULTRASOUND GUIDANCE FOR VASCULAR ACCESS SMA, RIGHT COLIC, MARGINAL BRANCH, AND 2 PERIPHERAL SEPARATE VASA RECTA BRANCHES CATHETERIZATIONS, ANGIOGRAMS, AND ADDITIONAL PERIPHERAL MICRO COIL EMBOLIZATION MEDICATIONS: 1% lidocaine  local. ANESTHESIA/SEDATION: Moderate (conscious) sedation was employed during this procedure. A total of Versed  1.5 mg and Fentanyl  50 mcg was administered intravenously by the radiology nurse. Total intra-service moderate Sedation Time: 60 minutes. The patient's level of consciousness and vital signs were monitored continuously by radiology nursing throughout the procedure under my direct supervision. CONTRAST:  85 cc omni 300 FLUOROSCOPY: Radiation Exposure Index (as provided by the fluoroscopic device): 2,418 mGy Kerma COMPLICATIONS: None immediate. PROCEDURE: Informed consent was obtained from the patient following explanation of the procedure, risks, benefits and alternatives. The patient  understands, agrees and consents for the procedure. All questions were addressed. A time out was performed prior to the initiation of the procedure. Maximal barrier sterile technique utilized including caps, mask, sterile gowns, sterile gloves,  large sterile drape, hand hygiene, and Betadine  prep. Previous imaging reviewed. Under sterile conditions and local anesthesia, ultrasound micropuncture access performed of the patent right common femoral artery. Images obtained for documentation of the patent right common femoral artery. Five French sheath inserted over a Bentson guidewire. C2 catheter advanced to select the SMA. Repeat SMA angiogram performed. SMA: SMA origin remains widely patent including the colic and jejunal branches. Initially no active bleeding demonstrated in the previously embolized right hepatic flexure distribution. Progreat single angle microcatheter was advanced through the C2 catheter into the SMA trunk. Fathom guidewire again utilized to select the right colic branch. Selective right colic angiogram performed. Right colic: Right colic remains patent leading into the right marginal artery. Peripheral Vasa recta branches to the hepatic flexure remain patent. Initially no active bleeding demonstrated. Microcatheter retracted and utilized to select the origin of 1 of the previously embolized 3 branches. Right vasa recta branch (1): Injection of the previously embolized branch demonstrates no active bleeding. Microcatheter retracted and utilized to select the origin of a second embolized branch. Right Vasa recta branch (2): This branch has been previously embolized however with delayed imaging, there is a rounded vascular blush distal to the coils which correlates with the CTA earlier today. Additional micro embolization: The origin of this branch was successfully embolized with the deployment of 2 additional coils (first coil was a 2 mm x 2 cm Terumo Azur coil followed by a 1 mm x 2 cm Ruby  low-profile coil. This completely embolized the branch back to its origin off of the marginal artery. Final completion angiogram no longer demonstrates the abnormal vascularity/blushing. Additional angiography within the marginal branch demonstrated no additional abnormal vascularity with preservation of the marginal artery of [place] to the right colon. At this point the microcatheter was removed. C2 catheter also removed. Hemostasis obtained at the right CFA puncture site with the CELT device. Patient tolerated the procedure well.  No immediate complication. IMPRESSION: Additional successful peripheral micro coil embolization of a right Vasa recta branch off of the SMA at the site of right hepatic flexure mild arterial oozing demonstrated by CTA earlier today. Electronically Signed   By: CHRISTELLA.  Shick M.D.   On: 05/14/2024 17:03   IR Angiogram Selective Each Additional Vessel Result Date: 05/14/2024 INDICATION: Recurrent acute right colon lower GI bleeding by CTA, associated blood-loss anemia, status post prior angio embolization and endoscopic clipping EXAM: ULTRASOUND GUIDANCE FOR VASCULAR ACCESS SMA, RIGHT COLIC, MARGINAL BRANCH, AND 2 PERIPHERAL SEPARATE VASA RECTA BRANCHES CATHETERIZATIONS, ANGIOGRAMS, AND ADDITIONAL PERIPHERAL MICRO COIL EMBOLIZATION MEDICATIONS: 1% lidocaine  local. ANESTHESIA/SEDATION: Moderate (conscious) sedation was employed during this procedure. A total of Versed  1.5 mg and Fentanyl  50 mcg was administered intravenously by the radiology nurse. Total intra-service moderate Sedation Time: 60 minutes. The patient's level of consciousness and vital signs were monitored continuously by radiology nursing throughout the procedure under my direct supervision. CONTRAST:  85 cc omni 300 FLUOROSCOPY: Radiation Exposure Index (as provided by the fluoroscopic device): 2,418 mGy Kerma COMPLICATIONS: None immediate. PROCEDURE: Informed consent was obtained from the patient following explanation of  the procedure, risks, benefits and alternatives. The patient understands, agrees and consents for the procedure. All questions were addressed. A time out was performed prior to the initiation of the procedure. Maximal barrier sterile technique utilized including caps, mask, sterile gowns, sterile gloves, large sterile drape, hand hygiene, and Betadine  prep. Previous imaging reviewed. Under sterile conditions and local anesthesia, ultrasound micropuncture access performed of the patent right common  femoral artery. Images obtained for documentation of the patent right common femoral artery. Five French sheath inserted over a Bentson guidewire. C2 catheter advanced to select the SMA. Repeat SMA angiogram performed. SMA: SMA origin remains widely patent including the colic and jejunal branches. Initially no active bleeding demonstrated in the previously embolized right hepatic flexure distribution. Progreat single angle microcatheter was advanced through the C2 catheter into the SMA trunk. Fathom guidewire again utilized to select the right colic branch. Selective right colic angiogram performed. Right colic: Right colic remains patent leading into the right marginal artery. Peripheral Vasa recta branches to the hepatic flexure remain patent. Initially no active bleeding demonstrated. Microcatheter retracted and utilized to select the origin of 1 of the previously embolized 3 branches. Right vasa recta branch (1): Injection of the previously embolized branch demonstrates no active bleeding. Microcatheter retracted and utilized to select the origin of a second embolized branch. Right Vasa recta branch (2): This branch has been previously embolized however with delayed imaging, there is a rounded vascular blush distal to the coils which correlates with the CTA earlier today. Additional micro embolization: The origin of this branch was successfully embolized with the deployment of 2 additional coils (first coil was a 2 mm  x 2 cm Terumo Azur coil followed by a 1 mm x 2 cm Ruby low-profile coil. This completely embolized the branch back to its origin off of the marginal artery. Final completion angiogram no longer demonstrates the abnormal vascularity/blushing. Additional angiography within the marginal branch demonstrated no additional abnormal vascularity with preservation of the marginal artery of [place] to the right colon. At this point the microcatheter was removed. C2 catheter also removed. Hemostasis obtained at the right CFA puncture site with the CELT device. Patient tolerated the procedure well.  No immediate complication. IMPRESSION: Additional successful peripheral micro coil embolization of a right Vasa recta branch off of the SMA at the site of right hepatic flexure mild arterial oozing demonstrated by CTA earlier today. Electronically Signed   By: CHRISTELLA.  Shick M.D.   On: 05/14/2024 17:03   IR Angiogram Selective Each Additional Vessel Result Date: 05/14/2024 INDICATION: Recurrent acute right colon lower GI bleeding by CTA, associated blood-loss anemia, status post prior angio embolization and endoscopic clipping EXAM: ULTRASOUND GUIDANCE FOR VASCULAR ACCESS SMA, RIGHT COLIC, MARGINAL BRANCH, AND 2 PERIPHERAL SEPARATE VASA RECTA BRANCHES CATHETERIZATIONS, ANGIOGRAMS, AND ADDITIONAL PERIPHERAL MICRO COIL EMBOLIZATION MEDICATIONS: 1% lidocaine  local. ANESTHESIA/SEDATION: Moderate (conscious) sedation was employed during this procedure. A total of Versed  1.5 mg and Fentanyl  50 mcg was administered intravenously by the radiology nurse. Total intra-service moderate Sedation Time: 60 minutes. The patient's level of consciousness and vital signs were monitored continuously by radiology nursing throughout the procedure under my direct supervision. CONTRAST:  85 cc omni 300 FLUOROSCOPY: Radiation Exposure Index (as provided by the fluoroscopic device): 2,418 mGy Kerma COMPLICATIONS: None immediate. PROCEDURE: Informed consent  was obtained from the patient following explanation of the procedure, risks, benefits and alternatives. The patient understands, agrees and consents for the procedure. All questions were addressed. A time out was performed prior to the initiation of the procedure. Maximal barrier sterile technique utilized including caps, mask, sterile gowns, sterile gloves, large sterile drape, hand hygiene, and Betadine  prep. Previous imaging reviewed. Under sterile conditions and local anesthesia, ultrasound micropuncture access performed of the patent right common femoral artery. Images obtained for documentation of the patent right common femoral artery. Five French sheath inserted over a Bentson guidewire. C2 catheter advanced to select  the SMA. Repeat SMA angiogram performed. SMA: SMA origin remains widely patent including the colic and jejunal branches. Initially no active bleeding demonstrated in the previously embolized right hepatic flexure distribution. Progreat single angle microcatheter was advanced through the C2 catheter into the SMA trunk. Fathom guidewire again utilized to select the right colic branch. Selective right colic angiogram performed. Right colic: Right colic remains patent leading into the right marginal artery. Peripheral Vasa recta branches to the hepatic flexure remain patent. Initially no active bleeding demonstrated. Microcatheter retracted and utilized to select the origin of 1 of the previously embolized 3 branches. Right vasa recta branch (1): Injection of the previously embolized branch demonstrates no active bleeding. Microcatheter retracted and utilized to select the origin of a second embolized branch. Right Vasa recta branch (2): This branch has been previously embolized however with delayed imaging, there is a rounded vascular blush distal to the coils which correlates with the CTA earlier today. Additional micro embolization: The origin of this branch was successfully embolized with the  deployment of 2 additional coils (first coil was a 2 mm x 2 cm Terumo Azur coil followed by a 1 mm x 2 cm Ruby low-profile coil. This completely embolized the branch back to its origin off of the marginal artery. Final completion angiogram no longer demonstrates the abnormal vascularity/blushing. Additional angiography within the marginal branch demonstrated no additional abnormal vascularity with preservation of the marginal artery of Drummond to the right colon. At this point the microcatheter was removed. C2 catheter also removed. Hemostasis obtained at the right CFA puncture site with the CELT device. Patient tolerated the procedure well.  No immediate complication. IMPRESSION: Additional successful peripheral micro coil embolization of a right Vasa recta branch off of the SMA at the site of right hepatic flexure mild arterial oozing demonstrated by CTA earlier today. Electronically Signed   By: CHRISTELLA.  Shick M.D.   On: 05/14/2024 17:03   IR US  Guide Vasc Access Right Result Date: 05/14/2024 INDICATION: Recurrent acute right colon lower GI bleeding by CTA, associated blood-loss anemia, status post prior angio embolization and endoscopic clipping EXAM: ULTRASOUND GUIDANCE FOR VASCULAR ACCESS SMA, RIGHT COLIC, MARGINAL BRANCH, AND 2 PERIPHERAL SEPARATE VASA RECTA BRANCHES CATHETERIZATIONS, ANGIOGRAMS, AND ADDITIONAL PERIPHERAL MICRO COIL EMBOLIZATION MEDICATIONS: 1% lidocaine  local. ANESTHESIA/SEDATION: Moderate (conscious) sedation was employed during this procedure. A total of Versed  1.5 mg and Fentanyl  50 mcg was administered intravenously by the radiology nurse. Total intra-service moderate Sedation Time: 60 minutes. The patient's level of consciousness and vital signs were monitored continuously by radiology nursing throughout the procedure under my direct supervision. CONTRAST:  85 cc omni 300 FLUOROSCOPY: Radiation Exposure Index (as provided by the fluoroscopic device): 2,418 mGy Kerma COMPLICATIONS: None  immediate. PROCEDURE: Informed consent was obtained from the patient following explanation of the procedure, risks, benefits and alternatives. The patient understands, agrees and consents for the procedure. All questions were addressed. A time out was performed prior to the initiation of the procedure. Maximal barrier sterile technique utilized including caps, mask, sterile gowns, sterile gloves, large sterile drape, hand hygiene, and Betadine  prep. Previous imaging reviewed. Under sterile conditions and local anesthesia, ultrasound micropuncture access performed of the patent right common femoral artery. Images obtained for documentation of the patent right common femoral artery. Five French sheath inserted over a Bentson guidewire. C2 catheter advanced to select the SMA. Repeat SMA angiogram performed. SMA: SMA origin remains widely patent including the colic and jejunal branches. Initially no active bleeding demonstrated in the previously  embolized right hepatic flexure distribution. Progreat single angle microcatheter was advanced through the C2 catheter into the SMA trunk. Fathom guidewire again utilized to select the right colic branch. Selective right colic angiogram performed. Right colic: Right colic remains patent leading into the right marginal artery. Peripheral Vasa recta branches to the hepatic flexure remain patent. Initially no active bleeding demonstrated. Microcatheter retracted and utilized to select the origin of 1 of the previously embolized 3 branches. Right vasa recta branch (1): Injection of the previously embolized branch demonstrates no active bleeding. Microcatheter retracted and utilized to select the origin of a second embolized branch. Right Vasa recta branch (2): This branch has been previously embolized however with delayed imaging, there is a rounded vascular blush distal to the coils which correlates with the CTA earlier today. Additional micro embolization: The origin of this  branch was successfully embolized with the deployment of 2 additional coils (first coil was a 2 mm x 2 cm Terumo Azur coil followed by a 1 mm x 2 cm Ruby low-profile coil. This completely embolized the branch back to its origin off of the marginal artery. Final completion angiogram no longer demonstrates the abnormal vascularity/blushing. Additional angiography within the marginal branch demonstrated no additional abnormal vascularity with preservation of the marginal artery of Drummond to the right colon. At this point the microcatheter was removed. C2 catheter also removed. Hemostasis obtained at the right CFA puncture site with the CELT device. Patient tolerated the procedure well.  No immediate complication. IMPRESSION: Additional successful peripheral micro coil embolization of a right Vasa recta branch off of the SMA at the site of right hepatic flexure mild arterial oozing demonstrated by CTA earlier today. Electronically Signed   By: CHRISTELLA.  Shick M.D.   On: 05/14/2024 17:03   CT Angio Abd/Pel w/ and/or w/o Result Date: 05/14/2024 EXAM: CTA ABDOMEN AND PELVIS WITH AND WITHOUT CONTRAST 05/14/2024 05:19:00 AM TECHNIQUE: CTA images of the abdomen and pelvis with and without intravenous contrast. Three-dimensional MIP/volume rendered formations were performed. Automated exposure control, iterative reconstruction, and/or weight based adjustment of the mA/kV was utilized to reduce the radiation dose to as low as reasonably achievable. 75 mL (iohexol  (OMNIPAQUE ) 350 MG/ML injection 75 mL IOHEXOL  350 MG/ML SOLN) was administered. COMPARISON: CTA abdomen and pelvis, 2 studies recently dated 05/11/2024 and 05/09/2024. CLINICAL HISTORY: Lower GI bleed with active bleeding at the hepatic flexure on the last CTA abdomen and pelvis. FINDINGS: VASCULATURE: GI BLEED: Active bleeding is again noted into the hepatic flexure just below embolization coils of SMA branches to the hepatic flexure. The active hemorrhage is best  seen on axial series 7 images 97 through 99, axial series 10 images 94 through 100, and coronal reconstruction series 19 image 90. No other active GI bleeding is seen on exam. AORTA: Minimal aortic calcific plaques without an aneurysm, dissection, or stenosis. CELIAC TRUNK: The celiac trunk is widely patent. There are nonstenosing ostial calcific plaques. SUPERIOR MESENTERIC ARTERY: The superior mesenteric artery is widely patent. There are nonstenosing ostial calcific plaques. INFERIOR MESENTERIC ARTERY: No acute finding. No occlusion or significant stenosis. RENAL ARTERIES: No acute finding. No occlusion or significant stenosis. ILIAC ARTERIES: The bilateral iliac arteries are patent with no occlusion or significant stenosis. There are trace calcifications in the common and the aortic arteries. More prominent patchy calcific plaques in the internal iliac arteries. No significant disease in the external iliac arteries. ABDOMEN/PELVIS: LOWER CHEST: Coarse atelectatic changes at the lower lobes. Lung bases are clear of infiltrates. Decreased  density of the cardiac blood pool consistent with anemia. Mild cardiomegaly. LIVER: The liver is unremarkable. GALLBLADDER AND BILE DUCTS: Gallbladder is unremarkable. No biliary ductal dilatation. SPLEEN: The spleen is unremarkable. PANCREAS: The pancreas is unremarkable. ADRENAL GLANDS: Bilateral adrenal glands demonstrate no acute abnormality. No adrenal mass. KIDNEYS, URETERS AND BLADDER: No stones in the kidneys or ureters. No hydronephrosis. No perinephric or periureteral stranding. Urinary bladder is unremarkable. No renal mass. 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 GI AND BOWEL: There is increased generalized fold thickening in the stomach. There are increased perigastric edematous changes. Findings could be from simple gastritis or inflammatory peptic ulcer disease, but whatever the etiology, this was not seen previously. No gastric pneumatosis or  portal venous gas is seen. The small bowel is of normal caliber without inflammatory change. The appendix is unremarkable. As above, embolization coils are noted medial to the hepatic flexure with adjacent active GI bleed into the hepatic flexure which was seen 3 days ago. The large bowel wall is of normal thickness. There is sigmoid diverticulosis without evidence of diverticulitis. There is no bowel obstruction. No abnormal bowel wall thickening or distension. REPRODUCTIVE: The uterus is surgically absent. No adnexal mass. PERITONEUM AND RETROPERITONEUM: There is a small amount of free ascites around the liver and in the pelvis. No free hemorrhage or free air is seen. No incarcerated hernia. Small fat-containing umbilical hernia. LYMPH NODES: No lymphadenopathy. 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 No acute or significant osseous findings. There is increased body wall anasarca. IMPRESSION: 1. Active GI bleeding into the hepatic flexure adjacent to embolization coils, as seen on prior study 3 days ago. No other active GI bleeding. 2. Increased generalized fold thickening in the stomach and increased perigastric edematous changes, not seen previously. Findings could be from gastritis or inflammatory peptic ulcer disease. 3. . Small volume of increased ascites, with increased body wall anasarca. 4. Bosniak 1 and 2 renal cysts. Electronically signed by: Francis Quam MD 05/14/2024 06:02 AM EST RP Workstation: [id number]    Labs:  CBC: Recent Labs    05/09/24 1722 05/09/24 2313 05/10/24 9372 05/10/24 1341 05/11/24 0538 05/11/24 0731 05/11/24 1253 05/13/24 0813 05/13/24 0934 05/14/24 1228 05/14/24 1356 05/14/24 2134 05/15/24 0406  WBC 31.9*  --  19.4*  --  13.5*  --   --  13.3*  --   --   --   --   --   HGB 8.8*   < > 6.3*   < > 6.1*  --    < > 7.1*   < > 7.0* 7.9* 8.3* 8.4*  HCT 25.5*   < > 18.4*   < > 17.5*  --    < > 20.8*    < > 19.6* 22.6* 23.8* 23.3*  PLT 103*  --  89*  --  85* 88*  --  145*  --   --   --   --   --    < > = values in this interval not displayed.    COAGS: Recent Labs    04/04/24 0621 05/11/24 0731  INR 1.0 1.0  APTT  --  24    BMP: Recent Labs    05/09/24 0651 05/10/24 0627 05/11/24 0538 05/13/24 0813  NA 132* 131* 132* 133*  K 5.2* 4.7 4.5 4.1  CL 104 104 104 103  CO2 19* 21* 25 26  GLUCOSE 265* 164* 171* 184*  BUN 44* 31* 19 8  CALCIUM  7.1* 6.9* 7.2* 7.3*  CREATININE 1.35* 1.01* 0.79 0.68  GFRNONAA 44* >60 >60 >60    LIVER FUNCTION TESTS: Recent Labs    04/04/24 0621 05/06/24 1147 05/10/24 1830  BILITOT 0.2 0.3 0.3  AST 22 24 22   ALT 13 13 7   ALKPHOS 68 81 41  PROT 6.7 7.2 3.7*  ALBUMIN 3.8 3.9 2.1*    Assessment and Plan: Recurrent lower GIB s/p embo x2-  Patient reports feeling much better today, denies nausea Patient reported 1 small bloody BM this AM around 0700 Hgb stable 8.4 this AM  Right groin site- Gauze with tegaderm dressing clean, dry, and intact Site is non-tender, non-erythematous, no ecchymosis, no drainage, no sign of hematoma or infection    Electronically Signed: [place] KATHEE Buff, NP 05/15/2024, 11:05 AM   I spent a total of 15 Minutes at the the patient's bedside AND on the patient's hospital floor or unit, greater than 50% of which was counseling/coordinating care for follow up after embolization for GI bleed.  "

## 2024-05-15 NOTE — TOC Progression Note (Signed)
 Transition of Care [facility]) - Progression Note    Patient Details  Name: Casey Johnston MRN: [id number] Date of Birth: [birth year]  Transition of Care [facility] CM/SW Contact  Corean JAYSON Canary, RN Phone Number: 05/15/2024, 1:17 PM  Clinical Narrative:     Patient had a procedure, watching H&H  Alert and oriented , feels better per MD notes. Continues in ICU No HH history in [facility] will continue to follow for needs    Barriers to Discharge: Continued Medical Work up               Expected Discharge Plan and Services                                               Social Drivers of Health (SDOH) Interventions SDOH Screenings   Food Insecurity: No Food Insecurity (05/06/2024)  Housing: Low Risk (05/06/2024)  Transportation Needs: No Transportation Needs (05/06/2024)  Utilities: Not At Risk (05/06/2024)  Financial Resource Strain: Low Risk (03/26/2023)   Received from [facility]  Physical Activity: Sufficiently Active (03/26/2023)   Received from [facility]  Social Connections: Moderately Isolated (03/26/2023)   Received from [facility]  Stress: Stress Concern Present (03/26/2023)   Received from [facility]  Tobacco Use: High Risk (05/14/2024)  Health Literacy: Low Risk (03/26/2023)   Received from [facility]    Readmission Risk Interventions    05/07/2024   11:36 AM  Readmission Risk Prevention Plan  Transportation Screening Complete  Home Care Screening Complete  Medication Review (RN CM) Complete

## 2024-05-15 NOTE — Progress Notes (Signed)
 "  NAME:  Casey Johnston, MRN:  [id number], DOB:  Jun 19, 1960, LOS: 9 ADMISSION DATE:  05/06/2024, CONSULTATION DATE:  05/08/24 REFERRING MD:  TRH, CHIEF COMPLAINT: Active GI bleed  History of Present Illness:  Casey Johnston is a 63 year old female with a past medical history significant for type 2 diabetes, HTN, HLD, OSA on no longer utilizing CPAP, GERD and anxiety who presented to the ED at [facility] 12/17 for complaints of shortness of breath and hematochezia.  Of note patient has history of gastric and small bowel AVMs with active GI bleed in the past, recent colonoscopy with diverticular disease.  Patient was admitted per hospitalist.  Patient seen with active bleed overnight 12/19 with resultant hypotension and tachycardia resulting in transfer to ICU for close monitoring  Pertinent  Medical History  Type 2 diabetes, HTN, HLD, OSA on no longer utilizing CPAP, GERD and anxiety  Significant Hospital Events: Including procedures, antibiotic start and stop dates in addition to other pertinent events   12/17 presented to [name] ED with hematochezia and shortness of breath 12/18 CT angio with active contrast extravasation involving the hepatic flexure of the colon consistent with active GI bleed > interventional radiology consulted.  Unfortunately patient remains at [facility] and was unable to undergo IR evaluation.  Patient arrived to [phone] at 1913 12/19 ongoing GI bleed overnight with hypotension and tachycardia.  Underwent embolization of right colic branches 12/20  Softer blood pressure today morning.  Had an episode of medium sized bowel movement with bright red blood.  Hemoglobin down to 6.4.  Given 2 units PRBC.  Surgery and GI consulted.  Repeat CTA done 12/23 colonoscopy. Ulcer found, clips placed. 1 PRBC for hgb dip likely reflectie of old blood seen during scope  12/24 HDS hgb stable txf to progressive 12/25 Developed bleed again overnight, the platelet this is less than  third time of the trauma she has not had any more bowel movements 5.9. CTA with continued bleed at hepatic flexure and got repeat embolization by IR.  Interim History / Subjective:   Hemoglobin stable overnight.  No more active bleed noted  Objective    Blood pressure (!) 133/41, pulse 85, temperature 97.9 F (36.6 C), temperature source Oral, resp. rate 19, height 5' 5 (1.651 m), weight 88.5 kg, SpO2 94%.        Intake/Output Summary (Last 24 hours) at 05/15/2024 1018 Last data filed at 05/15/2024 0900 Gross per 24 hour  Intake 859.7 ml  Output --  Net 859.7 ml   Filed Weights   05/06/24 1132 05/12/24 0935  Weight: 88.5 kg 88.5 kg    Examination: Awake, oriented No distress Heart rate regular rate and rhythm Lungs are clear Abdomen soft with positive bowel sounds Extremities with no edema  Lab/imaging reviewed Significant for sodium 133, BUN/creatinine 8/0.68 Hemoglobin 8.4   Resolved problem list  Acute kidney injury  Assessment and Plan  Recurrent lower GIB - s/p IR embo X 2, - s/p colonoscopy with clip hepatic flexure ulcer  R colon angiodysplasia ABLA superimposed on chronic anemia Thrombocytopenia  P: Advance diet PPI twice daily Follow hemoglobin. Will need surgery if there is continued bleed  Dm2  P: SSI  Add glargine 10  Hyponatremia,  mild  P Monitor  Leukocytosis, mild  -afebrile, dont think infectious  P: Observe off antibiotics  Anxiety P: As needed low-dose benzo  Stable for transfer out of ICU  Signature:   Lonna Coder MD [place] [facility] See Amion for pager  If no response to pager , please call [phone] until 7pm After 7:00 pm call Elink  [phone] 05/15/2024, 10:18 AM     "

## 2024-05-16 LAB — CBC
HCT: 19.3 % — ABNORMAL LOW (ref 36.0–46.0)
HCT: 23.4 % — ABNORMAL LOW (ref 36.0–46.0)
Hemoglobin: 6.7 g/dL — CL (ref 12.0–15.0)
Hemoglobin: 8 g/dL — ABNORMAL LOW (ref 12.0–15.0)
MCH: 29.8 pg (ref 26.0–34.0)
MCH: 30.3 pg (ref 26.0–34.0)
MCHC: 34.7 g/dL (ref 30.0–36.0)
MCHC: 34.2 g/dL (ref 30.0–36.0)
MCV: 85.8 fL (ref 80.0–100.0)
MCV: 88.6 fL (ref 80.0–100.0)
Platelets: 148 K/uL — ABNORMAL LOW (ref 150–400)
Platelets: 168 K/uL (ref 150–400)
RBC: 2.25 MIL/uL — ABNORMAL LOW (ref 3.87–5.11)
RBC: 2.64 MIL/uL — ABNORMAL LOW (ref 3.87–5.11)
RDW: 15.3 % (ref 11.5–15.5)
RDW: 15.7 % — ABNORMAL HIGH (ref 11.5–15.5)
WBC: 10.9 K/uL — ABNORMAL HIGH (ref 4.0–10.5)
WBC: 11.6 K/uL — ABNORMAL HIGH (ref 4.0–10.5)
nRBC: 0.2 % (ref 0.0–0.2)
nRBC: 0 % (ref 0.0–0.2)

## 2024-05-16 LAB — PREPARE RBC (CROSSMATCH)

## 2024-05-16 LAB — GLUCOSE, CAPILLARY
Glucose-Capillary: 215 mg/dL — ABNORMAL HIGH (ref 70–99)
Glucose-Capillary: 160 mg/dL — ABNORMAL HIGH (ref 70–99)
Glucose-Capillary: 102 mg/dL — ABNORMAL HIGH (ref 70–99)
Glucose-Capillary: 111 mg/dL — ABNORMAL HIGH (ref 70–99)

## 2024-05-16 LAB — BASIC METABOLIC PANEL WITH GFR
Anion gap: 4 — ABNORMAL LOW (ref 5–15)
BUN: 14 mg/dL (ref 8–23)
CO2: 24 mmol/L (ref 22–32)
Calcium: 7.3 mg/dL — ABNORMAL LOW (ref 8.9–10.3)
Chloride: 108 mmol/L (ref 98–111)
Creatinine, Ser: 0.67 mg/dL (ref 0.44–1.00)
GFR, Estimated: >60 mL/min
Glucose, Bld: 218 mg/dL — ABNORMAL HIGH (ref 70–99)
Potassium: 4.2 mmol/L (ref 3.5–5.1)
Sodium: 136 mmol/L (ref 135–145)

## 2024-05-16 LAB — HEMOGLOBIN AND HEMATOCRIT, BLOOD
HCT: 22.1 % — ABNORMAL LOW (ref 36.0–46.0)
Hemoglobin: 7.6 g/dL — ABNORMAL LOW (ref 12.0–15.0)

## 2024-05-16 MED ORDER — SODIUM CHLORIDE 0.9% IV SOLUTION: Freq: Once | INTRAVENOUS | Status: DC

## 2024-05-16 NOTE — Progress Notes (Signed)
 "    Progress Note    Casey Johnston  FMW:[id number] DOB: 06-02-1960  DOA: 05/06/2024 PCP: Teresa Jenkins Jansky, FNP      Brief Narrative:    Medical records reviewed and are as summarized below:  Casey Johnston is a 63 y.o. female  with a past medical history significant for type 2 diabetes, HTN, HLD, OSA on no longer utilizing CPAP, GERD and anxiety who presented to the ED at [facility] 12/17 for complaints of shortness of breath and hematochezia.  Of note patient has history of gastric and small bowel AVMs with active GI bleed in the past, recent colonoscopy with diverticular disease.  Patient was admitted per hospitalist.   Patient seen with active bleed overnight 12/19 with resultant hypotension and tachycardia resulting in transfer to ICU for close monitoring    Significant Hospital Events: Including procedures, antibiotic start and stop dates in addition to other pertinent events   12/17 presented to [name] ED with hematochezia and shortness of breath 12/18 CT angio with active contrast extravasation involving the hepatic flexure of the colon consistent with active GI bleed > interventional radiology consulted.  Unfortunately patient remains at [facility] and was unable to undergo IR evaluation.  Patient arrived to [phone] at 1913 12/19 ongoing GI bleed overnight with hypotension and tachycardia.  Underwent embolization of right colic branches 12/20  Softer blood pressure today morning.  Had an episode of medium sized bowel movement with bright red blood.  Hemoglobin down to 6.4.  Given 2 units PRBC.  Surgery and GI consulted.  Repeat CTA done 12/23 colonoscopy. Ulcer found, clips placed. 1 PRBC for hgb dip likely reflectie of old blood seen during scope  12/24 HDS hgb stable txf to progressive 12/25 Developed bleed again overnight, the platelet this is less than third time of the trauma she has not had any more bowel movements 5.9. CTA with continued bleed at hepatic flexure  and got repeat embolization by IR.      Assessment/Plan:   Principal Problem:   Lower GI bleed Active Problems:   Morbid obesity (HCC)   ABLA (acute blood loss anemia)   Anemia   Angiectasia of gastrointestinal tract   Upper GI bleed   Iron deficiency anemia due to chronic blood loss   Hyperkalemia   AKI (acute kidney injury)   Hypotension due to hypovolemia   Gastrointestinal hemorrhage   Nutrition Problem: Inadequate oral intake Etiology: acute illness  Signs/Symptoms:  (NPO/CLD)   Body mass index is 32.45 kg/m.    Acute recurrent lower GI bleeding: S/p IR embolization x 2 (12/19 and 04/2024).  S/p colonoscopy with clip hepatic flexure ulcer on 05/12/2024, right colon angiodysplasia. Follow-up with interventional radiologist and general surgeon.   Acute blood loss anemia: Patient was transfusion related to PRBCs on 05/16/2024.  Hemoglobin up from 5.6-6.7-7.6.   S/p transfusion with multiple units of PRBCs.   Type II DM with hyperglycemia: Continue Lantus .  NovoLog  as needed for hyperglycemia.   Hyponatremia: Improved   Leukocytosis: Improved  Comorbidities include anxiety, class I obesity    Diet Order             Diet NPO time specified Except for: Sips with Meds  Diet effective now                                  Consultants: Gastroenterologist Intensivist Interventional radiologist  Procedures:  Peripheral  microcoil embolization of right colic branches at the site of of right colon bleeding Colonoscopy 05/12/2024 sma angio and additional peripheral vasa recta branch embo to hepatic flexure 05/14/2024      Medications:    sodium chloride    Intravenous Once   sodium chloride    Intravenous Once   sodium chloride    Intravenous Once   atorvastatin   20 mg Oral Daily   Chlorhexidine  Gluconate Cloth  6 each Topical Daily   feeding supplement  1 Container Oral TID BM   insulin  aspart  0-15 Units Subcutaneous TID WC    insulin  aspart  0-5 Units Subcutaneous QHS   insulin  glargine  10 Units Subcutaneous Daily   multivitamin with minerals  1 tablet Oral Daily   pantoprazole   40 mg Oral BID   sodium chloride  flush  3 mL Intravenous Q12H   thiamine   100 mg Oral Daily   Continuous Infusions:   Anti-infectives (From admission, onward)    None              Family Communication/Anticipated D/C date and plan/Code Status   DVT prophylaxis: SCDs Start: 05/06/24 1732     Code Status: Full Code  Family Communication: None Disposition Plan: Plan to discharge home   Status is: Inpatient Remains inpatient appropriate because: Recurrent GI bleeding       Subjective:   Interval events noted.  She did not report any bloody stools when I saw her this morning.  Later in the day nurse reported the patient had a small blood bowel movement.  No abdominal pain no vomiting.  Objective:    Vitals:   05/16/24 1000 05/16/24 1100 05/16/24 1200 05/16/24 1300  BP: (!) 119/50 (!) 149/48 (!) 129/53 (!) 123/52  Pulse: 74 74 71 68  Resp:      Temp:   98.5 F (36.9 C)   TempSrc:   Oral   SpO2: 96% 95% 97% 96%  Weight:      Height:       No data found.   Intake/Output Summary (Last 24 hours) at 05/16/2024 1444 Last data filed at 05/16/2024 0825 Gross per 24 hour  Intake 1305.5 ml  Output --  Net 1305.5 ml   Filed Weights   05/06/24 1132 05/12/24 0935  Weight: 88.5 kg 88.5 kg    Exam:  GEN: NAD SKIN: Warm and dry EYES: Pale but anicteric ENT: MMM CV: RRR PULM: CTA B ABD: soft, obese, NT, +BS CNS: AAO x 3, non focal EXT: No edema or tenderness        Data Reviewed:   I have personally reviewed following labs and imaging studies:  Labs: Labs show the following:   Basic Metabolic Panel: Recent Labs  Lab 05/10/24 0627 05/11/24 0538 05/13/24 0813 05/14/24 0128 05/16/24 0408  NA 131* 132* 133*  --  136  K 4.7 4.5 4.1  --  4.2  CL 104 104 103  --  108  CO2 21* 25 26   --  24  GLUCOSE 164* 171* 184*  --  218*  BUN 31* 19 8  --  14  CREATININE 1.01* 0.79 0.68  --  0.67  CALCIUM  6.9* 7.2* 7.3*  --  7.3*  MG 1.5* 2.0 1.8 1.7  --   PHOS 3.4  --  2.7 2.7  --    GFR Estimated Creatinine Clearance: 78.1 mL/min (by C-G formula based on SCr of 0.67 mg/dL). Liver Function Tests: Recent Labs  Lab 05/10/24 1830  AST 22  ALT 7  ALKPHOS 41  BILITOT 0.3  PROT 3.7*  ALBUMIN 2.1*   No results for input(s): LIPASE, AMYLASE in the last 168 hours. No results for input(s): AMMONIA in the last 168 hours. Coagulation profile Recent Labs  Lab 05/11/24 0731  INR 1.0    CBC: Recent Labs  Lab 05/09/24 1722 05/09/24 2313 05/10/24 0627 05/10/24 1341 05/11/24 0538 05/11/24 0731 05/11/24 1253 05/13/24 0813 05/13/24 0934 05/15/24 1106 05/15/24 1448 05/15/24 2107 05/16/24 0408 05/16/24 1001  WBC 31.9*  --  19.4*  --  13.5*  --   --  13.3*  --   --   --   --  10.9*  --   HGB 8.8*   < > 6.3*   < > 6.1*  --    < > 7.1*   < > 7.2* 6.7* 5.6* 6.7* 7.6*  HCT 25.5*   < > 18.4*   < > 17.5*  --    < > 20.8*   < > 21.1* 18.9* 16.4* 19.3* 22.1*  MCV 87.0  --  87.6  --  85.4  --   --  87.8  --   --   --   --  85.8  --   PLT 103*  --  89*  --  85* 88*  --  145*  --   --   --   --  148*  --    < > = values in this interval not displayed.   Cardiac Enzymes: No results for input(s): CKTOTAL, CKMB, CKMBINDEX, TROPONINI in the last 168 hours. BNP (last 3 results) No results for input(s): PROBNP in the last 8760 hours. CBG: Recent Labs  Lab 05/15/24 1136 05/15/24 1720 05/15/24 2109 05/16/24 0810 05/16/24 1158  GLUCAP 228* 295* 277* 215* 160*   D-Dimer: No results for input(s): DDIMER in the last 72 hours. Hgb A1c: No results for input(s): HGBA1C in the last 72 hours. Lipid Profile: No results for input(s): CHOL, HDL, LDLCALC, TRIG, CHOLHDL, LDLDIRECT in the last 72 hours. Thyroid  function studies: No results for input(s):  TSH, T4TOTAL, T3FREE, THYROIDAB in the last 72 hours.  Invalid input(s): FREET3 Anemia work up: No results for input(s): VITAMINB12, FOLATE, FERRITIN, TIBC, IRON, RETICCTPCT in the last 72 hours. Sepsis Labs: Recent Labs  Lab 05/09/24 1722 05/10/24 0627 05/11/24 0538 05/13/24 0813 05/16/24 0408  WBC 31.9* 19.4* 13.5* 13.3* 10.9*  LATICACIDVEN 1.9  --   --   --   --     Microbiology Recent Results (from the past 240 hours)  MRSA Next Gen by PCR, Nasal     Status: None   Collection Time: 05/07/24  9:33 PM   Specimen: Nasal Mucosa; Nasal Swab  Result Value Ref Range Status   MRSA by PCR Next Gen NOT DETECTED NOT DETECTED Final    Comment: (NOTE) The GeneXpert MRSA Assay (FDA approved for NASAL specimens only), is one component of a comprehensive MRSA colonization surveillance program. It is not intended to diagnose MRSA infection nor to guide or monitor treatment for MRSA infections. Test performance is not FDA approved in patients less than 45 years old. Performed at [facility], [address] [place], [place] [address]     Procedures and diagnostic studies:  No results found.             LOS: 10 days   Casey Johnston  [facility]   Pager on [email or web address]. If 7PM-7AM, please contact night-coverage at [email or web address]  05/16/2024, 2:44 PM           "

## 2024-05-16 NOTE — Progress Notes (Signed)
 eLink Physician-Brief Progress Note Patient Name: Casey Johnston DOB: [birth year] MRN: [id number]   Date of Service  05/16/2024  HPI/Events of Note  Critical Value: Hgb 6.7  eICU Interventions  1 unit RBC to be transfused      Intervention Category Major Interventions: Hemorrhage - evaluation and management  Casey Johnston [name] 05/16/2024, 5:21 AM

## 2024-05-16 NOTE — Progress Notes (Signed)
 "  Trauma/Critical Care Follow Up Note  Subjective:    Overnight Issues:   Objective:  Vital signs for last 24 hours: Temp:  [98 F (36.7 C)-99.1 F (37.3 C)] 98.4 F (36.9 C) (12/27 0825) Pulse Rate:  [65-94] 73 (12/27 0825) Resp:  [10-23] 14 (12/27 0825) BP: (104-143)/(39-82) 115/46 (12/27 0825) SpO2:  [96 %-100 %] 97 % (12/27 0825)  Intake/Output from previous day: 12/26 0701 - 12/27 0700 In: 1769.7 [P.O.:1320; I.V.:3; Blood:446.7] Out: -   Intake/Output this shift: Total I/O In: 255.8 [Blood:255.8] Out: -   Vent settings for last 24 hours:    Physical Exam:  Gen: comfortable, no distress Neuro: follows commands, alert, communicative HEENT: PERRL Neck: supple CV: RRR Pulm: unlabored breathing on RA Abd: soft, NT  , +BM - bloody GU: urine clear and yellow, +spontaneous voids Extr: wwp, no edema  Results for orders placed or performed during the hospital encounter of 05/06/24 (from the past 24 hours)  Hemoglobin and hematocrit, blood     Status: Abnormal   Collection Time: 05/15/24 11:06 AM  Result Value Ref Range   Hemoglobin 7.2 (L) 12.0 - 15.0 g/dL   HCT 78.8 (L) 63.9 - 53.9 %  Glucose, capillary     Status: Abnormal   Collection Time: 05/15/24 11:36 AM  Result Value Ref Range   Glucose-Capillary 228 (H) 70 - 99 mg/dL  Hemoglobin and hematocrit, blood     Status: Abnormal   Collection Time: 05/15/24  2:48 PM  Result Value Ref Range   Hemoglobin 6.7 (LL) 12.0 - 15.0 g/dL   HCT 81.0 (L) 63.9 - 53.9 %  Prepare RBC (crossmatch)     Status: None   Collection Time: 05/15/24  4:30 PM  Result Value Ref Range   Order Confirmation      ORDER PROCESSED BY BLOOD BANK Performed at [facility], [address], [place], [place] [address]   Glucose, capillary     Status: Abnormal   Collection Time: 05/15/24  5:20 PM  Result Value Ref Range   Glucose-Capillary 295 (H) 70 - 99 mg/dL  Type and screen [facility]     Status: None (Preliminary  result)   Collection Time: 05/15/24  9:04 PM  Result Value Ref Range   ABO/RH(D) O POS    Antibody Screen NEG    Sample Expiration 05/18/2024,2359    Unit Number [id number]    Blood Component Type RED CELLS,LR    Unit division 00    Status of Unit ISSUED    Transfusion Status OK TO TRANSFUSE    Crossmatch Result Compatible    Unit Number [id number]    Blood Component Type RED CELLS,LR    Unit division 00    Status of Unit ISSUED    Transfusion Status OK TO TRANSFUSE    Crossmatch Result      Compatible Performed at [facility], [address], [place], [place] [address]   Hemoglobin and hematocrit, blood     Status: Abnormal   Collection Time: 05/15/24  9:07 PM  Result Value Ref Range   Hemoglobin 5.6 (LL) 12.0 - 15.0 g/dL   HCT 83.5 (L) 63.9 - 53.9 %  Glucose, capillary     Status: Abnormal   Collection Time: 05/15/24  9:09 PM  Result Value Ref Range   Glucose-Capillary 277 (H) 70 - 99 mg/dL  CBC     Status: Abnormal   Collection Time: 05/16/24  4:08 AM  Result  Value Ref Range   WBC 10.9 (H) 4.0 - 10.5 K/uL   RBC 2.25 (L) 3.87 - 5.11 MIL/uL   Hemoglobin 6.7 (LL) 12.0 - 15.0 g/dL   HCT 80.6 (L) 63.9 - 53.9 %   MCV 85.8 80.0 - 100.0 fL   MCH 29.8 26.0 - 34.0 pg   MCHC 34.7 30.0 - 36.0 g/dL   RDW 84.6 88.4 - 84.4 %   Platelets 148 (L) 150 - 400 K/uL   nRBC 0.2 0.0 - 0.2 %  Basic metabolic panel with GFR     Status: Abnormal   Collection Time: 05/16/24  4:08 AM  Result Value Ref Range   Sodium 136 135 - 145 mmol/L   Potassium 4.2 3.5 - 5.1 mmol/L   Chloride 108 98 - 111 mmol/L   CO2 24 22 - 32 mmol/L   Glucose, Bld 218 (H) 70 - 99 mg/dL   BUN 14 8 - 23 mg/dL   Creatinine, Ser 9.32 0.44 - 1.00 mg/dL   Calcium  7.3 (L) 8.9 - 10.3 mg/dL   GFR, Estimated >39 >39 mL/min   Anion gap 4 (L) 5 - 15  Prepare RBC (crossmatch)     Status: None   Collection Time: 05/16/24  6:00 AM  Result Value Ref Range   Order Confirmation      ORDER PROCESSED BY BLOOD  BANK Performed at [facility], [address], [place], [place] [address]   Glucose, capillary     Status: Abnormal   Collection Time: 05/16/24  8:10 AM  Result Value Ref Range   Glucose-Capillary 215 (H) 70 - 99 mg/dL    Assessment & Plan:  LOS: 10 days   Additional comments:I reviewed the patient's new clinical lab test results.   and I reviewed the patients new imaging test results.    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 Repeat CTA 12/25 AM with persistent bleed and underwent repeat AE. Persistent hgb drift, rec'd 1u pRBC yest and another this AM for hgb 6.7. Remains HDS. Case d/w IR again this AM, Dr. Jennefer, regarding potential benefit of more proximal embo. We discussed, and I have also discussed with the patient, the potential risk of resultant colonic ischemia necessitating surgical intervention. She is accepting of surgical management if indicated.    FEN - NPO for IR VTE - hold for bleeding issues   Disposition - Would prefer to avoid surgery if possible. Additional unit this AM. Will assess response and possibly go for AE this PM. Consider iron+vitC to aid in erythropoiesis. Limit blood draws.   Casey GEANNIE Hanger, MD Trauma & General Surgery Please use [email or web address] to contact on call provider  05/16/2024  *Care during the described time interval was provided by me. I have reviewed this patient's available data, including medical history, events of note, physical examination and test results as part of my evaluation.    "

## 2024-05-16 NOTE — Progress Notes (Signed)
 "   Referring Physician(s): Dr. Dreama Hanger  Supervising Physician: Jennefer Rover  Patient Status:  [facility] - In-pt  Chief Complaint: GI bleed  Subjective: Patient with additional bloody bowel movement this AM and recurrent Hgb drop to 6.7. States it was bright red, not dark. Upon assessment this AM she complains of fogginess but also received vicodin at 7am.  BP currently stable at 135/89.   Allergies: Januvia  [sitagliptin ] and Amlodipine   Medications: Prior to Admission medications  Medication Sig Start Date End Date Taking? Authorizing Provider  atorvastatin  (LIPITOR) 20 MG tablet TAKE 1 Tablet BY MOUTH ONCE EVERY DAY Patient taking differently: Take 20 mg by mouth daily. TAKE 1 Tablet BY MOUTH ONCE EVERY DAY 10/05/21  Yes Comer Kirsch, PA-C  cloNIDine  (CATAPRES ) 0.1 MG tablet Take 1 tablet (0.1 mg total) by mouth 2 (two) times daily. Patient taking differently: Take 0.1 mg by mouth daily. 10/05/21  Yes Comer Kirsch, PA-C  Dulaglutide (TRULICITY) 0.75 MG/0.5ML SOPN Inject 0.75 mg into the skin every Monday.   Yes [provider]  FEROSUL 325 (65 Fe) MG tablet Take 325 mg by mouth daily. 04/24/24  Yes [provider]  linaclotide  (LINZESS ) 72 MCG capsule Take 72 mcg by mouth daily as needed (constipation).   Yes [provider]  losartan -hydrochlorothiazide  (HYZAAR) 100-12.5 MG tablet Take 1 tablet by mouth daily. 11/09/22  Yes [provider]  metFORMIN  (GLUCOPHAGE ) 1000 MG tablet Take 1 tablet (1,000 mg total) by mouth 2 (two) times daily with a meal. 10/05/21  Yes Comer Kirsch, PA-C  pantoprazole  (PROTONIX ) 40 MG tablet Take 1 tablet (40 mg total) by mouth 2 (two) times daily before a meal. 04/12/24  Yes Sigdel, Santosh, MD  glipiZIDE  (GLUCOTROL  XL) 5 MG 24 hr tablet Take 1 tablet (5 mg total) by mouth daily with breakfast. 10/18/22   Lenis Ethelle ORN, MD     Vital Signs: BP (!) 138/55   Pulse 77   Temp 98.4 F (36.9 C)  (Oral)   Resp 14   Ht 5' 5 (1.651 m)   Wt 195 lb (88.5 kg)   SpO2 97%   BMI 32.45 kg/m   Physical Exam NAD, alert, fatigued. Abdomen: soft, non-tender. No cramping.    Imaging: IR EMBO ART  VEN HEMORR LYMPH EXTRAV  INC GUIDE ROADMAPPING Result Date: 05/14/2024 INDICATION: Recurrent acute right colon lower GI bleeding by CTA, associated blood-loss anemia, status post prior angio embolization and endoscopic clipping EXAM: ULTRASOUND GUIDANCE FOR VASCULAR ACCESS SMA, RIGHT COLIC, MARGINAL BRANCH, AND 2 PERIPHERAL SEPARATE VASA RECTA BRANCHES CATHETERIZATIONS, ANGIOGRAMS, AND ADDITIONAL PERIPHERAL MICRO COIL EMBOLIZATION MEDICATIONS: 1% lidocaine  local. ANESTHESIA/SEDATION: Moderate (conscious) sedation was employed during this procedure. A total of Versed  1.5 mg and Fentanyl  50 mcg was administered intravenously by the radiology nurse. Total intra-service moderate Sedation Time: 60 minutes. The patient's level of consciousness and vital signs were monitored continuously by radiology nursing throughout the procedure under my direct supervision. CONTRAST:  85 cc omni 300 FLUOROSCOPY: Radiation Exposure Index (as provided by the fluoroscopic device): 2,418 mGy Kerma COMPLICATIONS: None immediate. PROCEDURE: Informed consent was obtained from the patient following explanation of the procedure, risks, benefits and alternatives. The patient understands, agrees and consents for the procedure. All questions were addressed. A time out was performed prior to the initiation of the procedure. Maximal barrier sterile technique utilized including caps, mask, sterile gowns, sterile gloves, large sterile drape, hand hygiene, and Betadine  prep. Previous imaging reviewed. Under sterile conditions and local anesthesia, ultrasound micropuncture  access performed of the patent right common femoral artery. Images obtained for documentation of the patent right common femoral artery. Five French sheath inserted over a Bentson  guidewire. C2 catheter advanced to select the SMA. Repeat SMA angiogram performed. SMA: SMA origin remains widely patent including the colic and jejunal branches. Initially no active bleeding demonstrated in the previously embolized right hepatic flexure distribution. Progreat single angle microcatheter was advanced through the C2 catheter into the SMA trunk. Fathom guidewire again utilized to select the right colic branch. Selective right colic angiogram performed. Right colic: Right colic remains patent leading into the right marginal artery. Peripheral Vasa recta branches to the hepatic flexure remain patent. Initially no active bleeding demonstrated. Microcatheter retracted and utilized to select the origin of 1 of the previously embolized 3 branches. Right vasa recta branch (1): Injection of the previously embolized branch demonstrates no active bleeding. Microcatheter retracted and utilized to select the origin of a second embolized branch. Right Vasa recta branch (2): This branch has been previously embolized however with delayed imaging, there is a rounded vascular blush distal to the coils which correlates with the CTA earlier today. Additional micro embolization: The origin of this branch was successfully embolized with the deployment of 2 additional coils (first coil was a 2 mm x 2 cm Terumo Azur coil followed by a 1 mm x 2 cm Ruby low-profile coil. This completely embolized the branch back to its origin off of the marginal artery. Final completion angiogram no longer demonstrates the abnormal vascularity/blushing. Additional angiography within the marginal branch demonstrated no additional abnormal vascularity with preservation of the marginal artery of [place] to the right colon. At this point the microcatheter was removed. C2 catheter also removed. Hemostasis obtained at the right CFA puncture site with the CELT device. Patient tolerated the procedure well.  No immediate complication. IMPRESSION:  Additional successful peripheral micro coil embolization of a right Vasa recta branch off of the SMA at the site of right hepatic flexure mild arterial oozing demonstrated by CTA earlier today. Electronically Signed   By: CHRISTELLA.  Shick M.D.   On: 05/14/2024 17:03   IR Angiogram Visceral Selective Result Date: 05/14/2024 INDICATION: Recurrent acute right colon lower GI bleeding by CTA, associated blood-loss anemia, status post prior angio embolization and endoscopic clipping EXAM: ULTRASOUND GUIDANCE FOR VASCULAR ACCESS SMA, RIGHT COLIC, MARGINAL BRANCH, AND 2 PERIPHERAL SEPARATE VASA RECTA BRANCHES CATHETERIZATIONS, ANGIOGRAMS, AND ADDITIONAL PERIPHERAL MICRO COIL EMBOLIZATION MEDICATIONS: 1% lidocaine  local. ANESTHESIA/SEDATION: Moderate (conscious) sedation was employed during this procedure. A total of Versed  1.5 mg and Fentanyl  50 mcg was administered intravenously by the radiology nurse. Total intra-service moderate Sedation Time: 60 minutes. The patient's level of consciousness and vital signs were monitored continuously by radiology nursing throughout the procedure under my direct supervision. CONTRAST:  85 cc omni 300 FLUOROSCOPY: Radiation Exposure Index (as provided by the fluoroscopic device): 2,418 mGy Kerma COMPLICATIONS: None immediate. PROCEDURE: Informed consent was obtained from the patient following explanation of the procedure, risks, benefits and alternatives. The patient understands, agrees and consents for the procedure. All questions were addressed. A time out was performed prior to the initiation of the procedure. Maximal barrier sterile technique utilized including caps, mask, sterile gowns, sterile gloves, large sterile drape, hand hygiene, and Betadine  prep. Previous imaging reviewed. Under sterile conditions and local anesthesia, ultrasound micropuncture access performed of the patent right common femoral artery. Images obtained for documentation of the patent right common femoral artery.  Five French sheath inserted over a Bentson  guidewire. C2 catheter advanced to select the SMA. Repeat SMA angiogram performed. SMA: SMA origin remains widely patent including the colic and jejunal branches. Initially no active bleeding demonstrated in the previously embolized right hepatic flexure distribution. Progreat single angle microcatheter was advanced through the C2 catheter into the SMA trunk. Fathom guidewire again utilized to select the right colic branch. Selective right colic angiogram performed. Right colic: Right colic remains patent leading into the right marginal artery. Peripheral Vasa recta branches to the hepatic flexure remain patent. Initially no active bleeding demonstrated. Microcatheter retracted and utilized to select the origin of 1 of the previously embolized 3 branches. Right vasa recta branch (1): Injection of the previously embolized branch demonstrates no active bleeding. Microcatheter retracted and utilized to select the origin of a second embolized branch. Right Vasa recta branch (2): This branch has been previously embolized however with delayed imaging, there is a rounded vascular blush distal to the coils which correlates with the CTA earlier today. Additional micro embolization: The origin of this branch was successfully embolized with the deployment of 2 additional coils (first coil was a 2 mm x 2 cm Terumo Azur coil followed by a 1 mm x 2 cm Ruby low-profile coil. This completely embolized the branch back to its origin off of the marginal artery. Final completion angiogram no longer demonstrates the abnormal vascularity/blushing. Additional angiography within the marginal branch demonstrated no additional abnormal vascularity with preservation of the marginal artery of [place] to the right colon. At this point the microcatheter was removed. C2 catheter also removed. Hemostasis obtained at the right CFA puncture site with the CELT device. Patient tolerated the procedure well.   No immediate complication. IMPRESSION: Additional successful peripheral micro coil embolization of a right Vasa recta branch off of the SMA at the site of right hepatic flexure mild arterial oozing demonstrated by CTA earlier today. Electronically Signed   By: CHRISTELLA.  Shick M.D.   On: 05/14/2024 17:03   IR Angiogram Selective Each Additional Vessel Result Date: 05/14/2024 INDICATION: Recurrent acute right colon lower GI bleeding by CTA, associated blood-loss anemia, status post prior angio embolization and endoscopic clipping EXAM: ULTRASOUND GUIDANCE FOR VASCULAR ACCESS SMA, RIGHT COLIC, MARGINAL BRANCH, AND 2 PERIPHERAL SEPARATE VASA RECTA BRANCHES CATHETERIZATIONS, ANGIOGRAMS, AND ADDITIONAL PERIPHERAL MICRO COIL EMBOLIZATION MEDICATIONS: 1% lidocaine  local. ANESTHESIA/SEDATION: Moderate (conscious) sedation was employed during this procedure. A total of Versed  1.5 mg and Fentanyl  50 mcg was administered intravenously by the radiology nurse. Total intra-service moderate Sedation Time: 60 minutes. The patient's level of consciousness and vital signs were monitored continuously by radiology nursing throughout the procedure under my direct supervision. CONTRAST:  85 cc omni 300 FLUOROSCOPY: Radiation Exposure Index (as provided by the fluoroscopic device): 2,418 mGy Kerma COMPLICATIONS: None immediate. PROCEDURE: Informed consent was obtained from the patient following explanation of the procedure, risks, benefits and alternatives. The patient understands, agrees and consents for the procedure. All questions were addressed. A time out was performed prior to the initiation of the procedure. Maximal barrier sterile technique utilized including caps, mask, sterile gowns, sterile gloves, large sterile drape, hand hygiene, and Betadine  prep. Previous imaging reviewed. Under sterile conditions and local anesthesia, ultrasound micropuncture access performed of the patent right common femoral artery. Images obtained for  documentation of the patent right common femoral artery. Five French sheath inserted over a Bentson guidewire. C2 catheter advanced to select the SMA. Repeat SMA angiogram performed. SMA: SMA origin remains widely patent including the colic and jejunal branches. Initially no  active bleeding demonstrated in the previously embolized right hepatic flexure distribution. Progreat single angle microcatheter was advanced through the C2 catheter into the SMA trunk. Fathom guidewire again utilized to select the right colic branch. Selective right colic angiogram performed. Right colic: Right colic remains patent leading into the right marginal artery. Peripheral Vasa recta branches to the hepatic flexure remain patent. Initially no active bleeding demonstrated. Microcatheter retracted and utilized to select the origin of 1 of the previously embolized 3 branches. Right vasa recta branch (1): Injection of the previously embolized branch demonstrates no active bleeding. Microcatheter retracted and utilized to select the origin of a second embolized branch. Right Vasa recta branch (2): This branch has been previously embolized however with delayed imaging, there is a rounded vascular blush distal to the coils which correlates with the CTA earlier today. Additional micro embolization: The origin of this branch was successfully embolized with the deployment of 2 additional coils (first coil was a 2 mm x 2 cm Terumo Azur coil followed by a 1 mm x 2 cm Ruby low-profile coil. This completely embolized the branch back to its origin off of the marginal artery. Final completion angiogram no longer demonstrates the abnormal vascularity/blushing. Additional angiography within the marginal branch demonstrated no additional abnormal vascularity with preservation of the marginal artery of [place] to the right colon. At this point the microcatheter was removed. C2 catheter also removed. Hemostasis obtained at the right CFA puncture site with  the CELT device. Patient tolerated the procedure well.  No immediate complication. IMPRESSION: Additional successful peripheral micro coil embolization of a right Vasa recta branch off of the SMA at the site of right hepatic flexure mild arterial oozing demonstrated by CTA earlier today. Electronically Signed   By: CHRISTELLA.  Shick M.D.   On: 05/14/2024 17:03   IR Angiogram Selective Each Additional Vessel Result Date: 05/14/2024 INDICATION: Recurrent acute right colon lower GI bleeding by CTA, associated blood-loss anemia, status post prior angio embolization and endoscopic clipping EXAM: ULTRASOUND GUIDANCE FOR VASCULAR ACCESS SMA, RIGHT COLIC, MARGINAL BRANCH, AND 2 PERIPHERAL SEPARATE VASA RECTA BRANCHES CATHETERIZATIONS, ANGIOGRAMS, AND ADDITIONAL PERIPHERAL MICRO COIL EMBOLIZATION MEDICATIONS: 1% lidocaine  local. ANESTHESIA/SEDATION: Moderate (conscious) sedation was employed during this procedure. A total of Versed  1.5 mg and Fentanyl  50 mcg was administered intravenously by the radiology nurse. Total intra-service moderate Sedation Time: 60 minutes. The patient's level of consciousness and vital signs were monitored continuously by radiology nursing throughout the procedure under my direct supervision. CONTRAST:  85 cc omni 300 FLUOROSCOPY: Radiation Exposure Index (as provided by the fluoroscopic device): 2,418 mGy Kerma COMPLICATIONS: None immediate. PROCEDURE: Informed consent was obtained from the patient following explanation of the procedure, risks, benefits and alternatives. The patient understands, agrees and consents for the procedure. All questions were addressed. A time out was performed prior to the initiation of the procedure. Maximal barrier sterile technique utilized including caps, mask, sterile gowns, sterile gloves, large sterile drape, hand hygiene, and Betadine  prep. Previous imaging reviewed. Under sterile conditions and local anesthesia, ultrasound micropuncture access performed of the  patent right common femoral artery. Images obtained for documentation of the patent right common femoral artery. Five French sheath inserted over a Bentson guidewire. C2 catheter advanced to select the SMA. Repeat SMA angiogram performed. SMA: SMA origin remains widely patent including the colic and jejunal branches. Initially no active bleeding demonstrated in the previously embolized right hepatic flexure distribution. Progreat single angle microcatheter was advanced through the C2 catheter into the SMA trunk. Fathom  guidewire again utilized to select the right colic branch. Selective right colic angiogram performed. Right colic: Right colic remains patent leading into the right marginal artery. Peripheral Vasa recta branches to the hepatic flexure remain patent. Initially no active bleeding demonstrated. Microcatheter retracted and utilized to select the origin of 1 of the previously embolized 3 branches. Right vasa recta branch (1): Injection of the previously embolized branch demonstrates no active bleeding. Microcatheter retracted and utilized to select the origin of a second embolized branch. Right Vasa recta branch (2): This branch has been previously embolized however with delayed imaging, there is a rounded vascular blush distal to the coils which correlates with the CTA earlier today. Additional micro embolization: The origin of this branch was successfully embolized with the deployment of 2 additional coils (first coil was a 2 mm x 2 cm Terumo Azur coil followed by a 1 mm x 2 cm Ruby low-profile coil. This completely embolized the branch back to its origin off of the marginal artery. Final completion angiogram no longer demonstrates the abnormal vascularity/blushing. Additional angiography within the marginal branch demonstrated no additional abnormal vascularity with preservation of the marginal artery of [place] to the right colon. At this point the microcatheter was removed. C2 catheter also removed.  Hemostasis obtained at the right CFA puncture site with the CELT device. Patient tolerated the procedure well.  No immediate complication. IMPRESSION: Additional successful peripheral micro coil embolization of a right Vasa recta branch off of the SMA at the site of right hepatic flexure mild arterial oozing demonstrated by CTA earlier today. Electronically Signed   By: CHRISTELLA.  Shick M.D.   On: 05/14/2024 17:03   IR Angiogram Selective Each Additional Vessel Result Date: 05/14/2024 INDICATION: Recurrent acute right colon lower GI bleeding by CTA, associated blood-loss anemia, status post prior angio embolization and endoscopic clipping EXAM: ULTRASOUND GUIDANCE FOR VASCULAR ACCESS SMA, RIGHT COLIC, MARGINAL BRANCH, AND 2 PERIPHERAL SEPARATE VASA RECTA BRANCHES CATHETERIZATIONS, ANGIOGRAMS, AND ADDITIONAL PERIPHERAL MICRO COIL EMBOLIZATION MEDICATIONS: 1% lidocaine  local. ANESTHESIA/SEDATION: Moderate (conscious) sedation was employed during this procedure. A total of Versed  1.5 mg and Fentanyl  50 mcg was administered intravenously by the radiology nurse. Total intra-service moderate Sedation Time: 60 minutes. The patient's level of consciousness and vital signs were monitored continuously by radiology nursing throughout the procedure under my direct supervision. CONTRAST:  85 cc omni 300 FLUOROSCOPY: Radiation Exposure Index (as provided by the fluoroscopic device): 2,418 mGy Kerma COMPLICATIONS: None immediate. PROCEDURE: Informed consent was obtained from the patient following explanation of the procedure, risks, benefits and alternatives. The patient understands, agrees and consents for the procedure. All questions were addressed. A time out was performed prior to the initiation of the procedure. Maximal barrier sterile technique utilized including caps, mask, sterile gowns, sterile gloves, large sterile drape, hand hygiene, and Betadine  prep. Previous imaging reviewed. Under sterile conditions and local  anesthesia, ultrasound micropuncture access performed of the patent right common femoral artery. Images obtained for documentation of the patent right common femoral artery. Five French sheath inserted over a Bentson guidewire. C2 catheter advanced to select the SMA. Repeat SMA angiogram performed. SMA: SMA origin remains widely patent including the colic and jejunal branches. Initially no active bleeding demonstrated in the previously embolized right hepatic flexure distribution. Progreat single angle microcatheter was advanced through the C2 catheter into the SMA trunk. Fathom guidewire again utilized to select the right colic branch. Selective right colic angiogram performed. Right colic: Right colic remains patent leading into the right marginal artery.  Peripheral Vasa recta branches to the hepatic flexure remain patent. Initially no active bleeding demonstrated. Microcatheter retracted and utilized to select the origin of 1 of the previously embolized 3 branches. Right vasa recta branch (1): Injection of the previously embolized branch demonstrates no active bleeding. Microcatheter retracted and utilized to select the origin of a second embolized branch. Right Vasa recta branch (2): This branch has been previously embolized however with delayed imaging, there is a rounded vascular blush distal to the coils which correlates with the CTA earlier today. Additional micro embolization: The origin of this branch was successfully embolized with the deployment of 2 additional coils (first coil was a 2 mm x 2 cm Terumo Azur coil followed by a 1 mm x 2 cm Ruby low-profile coil. This completely embolized the branch back to its origin off of the marginal artery. Final completion angiogram no longer demonstrates the abnormal vascularity/blushing. Additional angiography within the marginal branch demonstrated no additional abnormal vascularity with preservation of the marginal artery of [place] to the right colon. At this  point the microcatheter was removed. C2 catheter also removed. Hemostasis obtained at the right CFA puncture site with the CELT device. Patient tolerated the procedure well.  No immediate complication. IMPRESSION: Additional successful peripheral micro coil embolization of a right Vasa recta branch off of the SMA at the site of right hepatic flexure mild arterial oozing demonstrated by CTA earlier today. Electronically Signed   By: CHRISTELLA.  Shick M.D.   On: 05/14/2024 17:03   IR Angiogram Selective Each Additional Vessel Result Date: 05/14/2024 INDICATION: Recurrent acute right colon lower GI bleeding by CTA, associated blood-loss anemia, status post prior angio embolization and endoscopic clipping EXAM: ULTRASOUND GUIDANCE FOR VASCULAR ACCESS SMA, RIGHT COLIC, MARGINAL BRANCH, AND 2 PERIPHERAL SEPARATE VASA RECTA BRANCHES CATHETERIZATIONS, ANGIOGRAMS, AND ADDITIONAL PERIPHERAL MICRO COIL EMBOLIZATION MEDICATIONS: 1% lidocaine  local. ANESTHESIA/SEDATION: Moderate (conscious) sedation was employed during this procedure. A total of Versed  1.5 mg and Fentanyl  50 mcg was administered intravenously by the radiology nurse. Total intra-service moderate Sedation Time: 60 minutes. The patient's level of consciousness and vital signs were monitored continuously by radiology nursing throughout the procedure under my direct supervision. CONTRAST:  85 cc omni 300 FLUOROSCOPY: Radiation Exposure Index (as provided by the fluoroscopic device): 2,418 mGy Kerma COMPLICATIONS: None immediate. PROCEDURE: Informed consent was obtained from the patient following explanation of the procedure, risks, benefits and alternatives. The patient understands, agrees and consents for the procedure. All questions were addressed. A time out was performed prior to the initiation of the procedure. Maximal barrier sterile technique utilized including caps, mask, sterile gowns, sterile gloves, large sterile drape, hand hygiene, and Betadine  prep.  Previous imaging reviewed. Under sterile conditions and local anesthesia, ultrasound micropuncture access performed of the patent right common femoral artery. Images obtained for documentation of the patent right common femoral artery. Five French sheath inserted over a Bentson guidewire. C2 catheter advanced to select the SMA. Repeat SMA angiogram performed. SMA: SMA origin remains widely patent including the colic and jejunal branches. Initially no active bleeding demonstrated in the previously embolized right hepatic flexure distribution. Progreat single angle microcatheter was advanced through the C2 catheter into the SMA trunk. Fathom guidewire again utilized to select the right colic branch. Selective right colic angiogram performed. Right colic: Right colic remains patent leading into the right marginal artery. Peripheral Vasa recta branches to the hepatic flexure remain patent. Initially no active bleeding demonstrated. Microcatheter retracted and utilized to select the origin of 1 of  the previously embolized 3 branches. Right vasa recta branch (1): Injection of the previously embolized branch demonstrates no active bleeding. Microcatheter retracted and utilized to select the origin of a second embolized branch. Right Vasa recta branch (2): This branch has been previously embolized however with delayed imaging, there is a rounded vascular blush distal to the coils which correlates with the CTA earlier today. Additional micro embolization: The origin of this branch was successfully embolized with the deployment of 2 additional coils (first coil was a 2 mm x 2 cm Terumo Azur coil followed by a 1 mm x 2 cm Ruby low-profile coil. This completely embolized the branch back to its origin off of the marginal artery. Final completion angiogram no longer demonstrates the abnormal vascularity/blushing. Additional angiography within the marginal branch demonstrated no additional abnormal vascularity with preservation of  the marginal artery of Drummond to the right colon. At this point the microcatheter was removed. C2 catheter also removed. Hemostasis obtained at the right CFA puncture site with the CELT device. Patient tolerated the procedure well.  No immediate complication. IMPRESSION: Additional successful peripheral micro coil embolization of a right Vasa recta branch off of the SMA at the site of right hepatic flexure mild arterial oozing demonstrated by CTA earlier today. Electronically Signed   By: CHRISTELLA.  Shick M.D.   On: 05/14/2024 17:03   IR US  Guide Vasc Access Right Result Date: 05/14/2024 INDICATION: Recurrent acute right colon lower GI bleeding by CTA, associated blood-loss anemia, status post prior angio embolization and endoscopic clipping EXAM: ULTRASOUND GUIDANCE FOR VASCULAR ACCESS SMA, RIGHT COLIC, MARGINAL BRANCH, AND 2 PERIPHERAL SEPARATE VASA RECTA BRANCHES CATHETERIZATIONS, ANGIOGRAMS, AND ADDITIONAL PERIPHERAL MICRO COIL EMBOLIZATION MEDICATIONS: 1% lidocaine  local. ANESTHESIA/SEDATION: Moderate (conscious) sedation was employed during this procedure. A total of Versed  1.5 mg and Fentanyl  50 mcg was administered intravenously by the radiology nurse. Total intra-service moderate Sedation Time: 60 minutes. The patient's level of consciousness and vital signs were monitored continuously by radiology nursing throughout the procedure under my direct supervision. CONTRAST:  85 cc omni 300 FLUOROSCOPY: Radiation Exposure Index (as provided by the fluoroscopic device): 2,418 mGy Kerma COMPLICATIONS: None immediate. PROCEDURE: Informed consent was obtained from the patient following explanation of the procedure, risks, benefits and alternatives. The patient understands, agrees and consents for the procedure. All questions were addressed. A time out was performed prior to the initiation of the procedure. Maximal barrier sterile technique utilized including caps, mask, sterile gowns, sterile gloves, large sterile  drape, hand hygiene, and Betadine  prep. Previous imaging reviewed. Under sterile conditions and local anesthesia, ultrasound micropuncture access performed of the patent right common femoral artery. Images obtained for documentation of the patent right common femoral artery. Five French sheath inserted over a Bentson guidewire. C2 catheter advanced to select the SMA. Repeat SMA angiogram performed. SMA: SMA origin remains widely patent including the colic and jejunal branches. Initially no active bleeding demonstrated in the previously embolized right hepatic flexure distribution. Progreat single angle microcatheter was advanced through the C2 catheter into the SMA trunk. Fathom guidewire again utilized to select the right colic branch. Selective right colic angiogram performed. Right colic: Right colic remains patent leading into the right marginal artery. Peripheral Vasa recta branches to the hepatic flexure remain patent. Initially no active bleeding demonstrated. Microcatheter retracted and utilized to select the origin of 1 of the previously embolized 3 branches. Right vasa recta branch (1): Injection of the previously embolized branch demonstrates no active bleeding. Microcatheter retracted and utilized to select  the origin of a second embolized branch. Right Vasa recta branch (2): This branch has been previously embolized however with delayed imaging, there is a rounded vascular blush distal to the coils which correlates with the CTA earlier today. Additional micro embolization: The origin of this branch was successfully embolized with the deployment of 2 additional coils (first coil was a 2 mm x 2 cm Terumo Azur coil followed by a 1 mm x 2 cm Ruby low-profile coil. This completely embolized the branch back to its origin off of the marginal artery. Final completion angiogram no longer demonstrates the abnormal vascularity/blushing. Additional angiography within the marginal branch demonstrated no additional  abnormal vascularity with preservation of the marginal artery of Drummond to the right colon. At this point the microcatheter was removed. C2 catheter also removed. Hemostasis obtained at the right CFA puncture site with the CELT device. Patient tolerated the procedure well.  No immediate complication. IMPRESSION: Additional successful peripheral micro coil embolization of a right Vasa recta branch off of the SMA at the site of right hepatic flexure mild arterial oozing demonstrated by CTA earlier today. Electronically Signed   By: CHRISTELLA.  Shick M.D.   On: 05/14/2024 17:03   CT Angio Abd/Pel w/ and/or w/o Result Date: 05/14/2024 EXAM: CTA ABDOMEN AND PELVIS WITH AND WITHOUT CONTRAST 05/14/2024 05:19:00 AM TECHNIQUE: CTA images of the abdomen and pelvis with and without intravenous contrast. Three-dimensional MIP/volume rendered formations were performed. Automated exposure control, iterative reconstruction, and/or weight based adjustment of the mA/kV was utilized to reduce the radiation dose to as low as reasonably achievable. 75 mL (iohexol  (OMNIPAQUE ) 350 MG/ML injection 75 mL IOHEXOL  350 MG/ML SOLN) was administered. COMPARISON: CTA abdomen and pelvis, 2 studies recently dated 05/11/2024 and 05/09/2024. CLINICAL HISTORY: Lower GI bleed with active bleeding at the hepatic flexure on the last CTA abdomen and pelvis. FINDINGS: VASCULATURE: GI BLEED: Active bleeding is again noted into the hepatic flexure just below embolization coils of SMA branches to the hepatic flexure. The active hemorrhage is best seen on axial series 7 images 97 through 99, axial series 10 images 94 through 100, and coronal reconstruction series 19 image 90. No other active GI bleeding is seen on exam. AORTA: Minimal aortic calcific plaques without an aneurysm, dissection, or stenosis. CELIAC TRUNK: The celiac trunk is widely patent. There are nonstenosing ostial calcific plaques. SUPERIOR MESENTERIC ARTERY: The superior mesenteric artery is  widely patent. There are nonstenosing ostial calcific plaques. INFERIOR MESENTERIC ARTERY: No acute finding. No occlusion or significant stenosis. RENAL ARTERIES: No acute finding. No occlusion or significant stenosis. ILIAC ARTERIES: The bilateral iliac arteries are patent with no occlusion or significant stenosis. There are trace calcifications in the common and the aortic arteries. More prominent patchy calcific plaques in the internal iliac arteries. No significant disease in the external iliac arteries. ABDOMEN/PELVIS: LOWER CHEST: Coarse atelectatic changes at the lower lobes. Lung bases are clear of infiltrates. Decreased density of the cardiac blood pool consistent with anemia. Mild cardiomegaly. LIVER: The liver is unremarkable. GALLBLADDER AND BILE DUCTS: Gallbladder is unremarkable. No biliary ductal dilatation. SPLEEN: The spleen is unremarkable. PANCREAS: The pancreas is unremarkable. ADRENAL GLANDS: Bilateral adrenal glands demonstrate no acute abnormality. No adrenal mass. KIDNEYS, URETERS AND BLADDER: No stones in the kidneys or ureters. No hydronephrosis. No perinephric or periureteral stranding. Urinary bladder is unremarkable. No renal mass. 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 GI AND BOWEL: There is increased generalized fold thickening in the stomach. There  are increased perigastric edematous changes. Findings could be from simple gastritis or inflammatory peptic ulcer disease, but whatever the etiology, this was not seen previously. No gastric pneumatosis or portal venous gas is seen. The small bowel is of normal caliber without inflammatory change. The appendix is unremarkable. As above, embolization coils are noted medial to the hepatic flexure with adjacent active GI bleed into the hepatic flexure which was seen 3 days ago. The large bowel wall is of normal thickness. There is sigmoid diverticulosis without evidence of diverticulitis. There is no bowel obstruction. No  abnormal bowel wall thickening or distension. REPRODUCTIVE: The uterus is surgically absent. No adnexal mass. PERITONEUM AND RETROPERITONEUM: There is a small amount of free ascites around the liver and in the pelvis. No free hemorrhage or free air is seen. No incarcerated hernia. Small fat-containing umbilical hernia. LYMPH NODES: No lymphadenopathy. 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 No acute or significant osseous findings. There is increased body wall anasarca. IMPRESSION: 1. Active GI bleeding into the hepatic flexure adjacent to embolization coils, as seen on prior study 3 days ago. No other active GI bleeding. 2. Increased generalized fold thickening in the stomach and increased perigastric edematous changes, not seen previously. Findings could be from gastritis or inflammatory peptic ulcer disease. 3. . Small volume of increased ascites, with increased body wall anasarca. 4. Bosniak 1 and 2 renal cysts. Electronically signed by: Francis Quam MD 05/14/2024 06:02 AM EST RP Workstation: [id number]    Labs:  CBC: Recent Labs    05/10/24 0627 05/10/24 1341 05/11/24 0538 05/11/24 0731 05/11/24 1253 05/13/24 0813 05/13/24 0934 05/15/24 1448 05/15/24 2107 05/16/24 0408 05/16/24 1001  WBC 19.4*  --  13.5*  --   --  13.3*  --   --   --  10.9*  --   HGB 6.3*   < > 6.1*  --    < > 7.1*   < > 6.7* 5.6* 6.7* 7.6*  HCT 18.4*   < > 17.5*  --    < > 20.8*   < > 18.9* 16.4* 19.3* 22.1*  PLT 89*  --  85* 88*  --  145*  --   --   --  148*  --    < > = values in this interval not displayed.    COAGS: Recent Labs    04/04/24 0621 05/11/24 0731  INR 1.0 1.0  APTT  --  24    BMP: Recent Labs    05/10/24 0627 05/11/24 0538 05/13/24 0813 05/16/24 0408  NA 131* 132* 133* 136  K 4.7 4.5 4.1 4.2  CL 104 104 103 108  CO2 21* 25 26 24   GLUCOSE 164* 171* 184* 218*  BUN 31* 19 8 14   CALCIUM  6.9* 7.2* 7.3* 7.3*   CREATININE 1.01* 0.79 0.68 0.67  GFRNONAA >60 >60 >60 >60    LIVER FUNCTION TESTS: Recent Labs    04/04/24 0621 05/06/24 1147 05/10/24 1830  BILITOT 0.2 0.3 0.3  AST 22 24 22   ALT 13 13 7   ALKPHOS 68 81 41  PROT 6.7 7.2 3.7*  ALBUMIN 3.8 3.9 2.1*    Assessment and Plan: Recurrent lower GI bleed s/p embolization x2 (12/19, 12/25) Additional BRBPR this AM with drop in Hgb to 6.7.  1u PRBC ordered and given this AM.  Discussed with Dr. Jennefer who has also discussed this complex case with both Dr. Polly and Dr. Paola.  Plan to recheck Hgb at noon today and monitor clinical status.  As patient has been embolized twice, additional embolization can be performed with careful consideration and close monitoring for the development of mesenteric ischemia.  This was discussed with the patient as well.   IR following.    Electronically Signed: Michelene Keniston Sue-Ellen Alysabeth Scalia, PA 05/16/2024, 11:16 AM   I spent a total of 15 Minutes at the the patient's bedside AND on the patient's hospital floor or unit, greater than 50% of which was counseling/coordinating care for GI bleed.      "

## 2024-05-17 DIAGNOSIS — K922 Gastrointestinal hemorrhage, unspecified: Secondary | ICD-10-CM | POA: Diagnosis not present

## 2024-05-17 LAB — HEMOGLOBIN AND HEMATOCRIT, BLOOD
HCT: 19.6 % — ABNORMAL LOW (ref 36.0–46.0)
HCT: 29.2 % — ABNORMAL LOW (ref 36.0–46.0)
HCT: 24.7 % — ABNORMAL LOW (ref 36.0–46.0)
HCT: 24.5 % — ABNORMAL LOW (ref 36.0–46.0)
Hemoglobin: 6.6 g/dL — CL (ref 12.0–15.0)
Hemoglobin: 9.8 g/dL — ABNORMAL LOW (ref 12.0–15.0)
Hemoglobin: 8.3 g/dL — ABNORMAL LOW (ref 12.0–15.0)
Hemoglobin: 7.9 g/dL — ABNORMAL LOW (ref 12.0–15.0)

## 2024-05-17 LAB — GLUCOSE, CAPILLARY
Glucose-Capillary: 120 mg/dL — ABNORMAL HIGH (ref 70–99)
Glucose-Capillary: 116 mg/dL — ABNORMAL HIGH (ref 70–99)
Glucose-Capillary: 162 mg/dL — ABNORMAL HIGH (ref 70–99)
Glucose-Capillary: 247 mg/dL — ABNORMAL HIGH (ref 70–99)

## 2024-05-17 LAB — PREPARE RBC (CROSSMATCH)

## 2024-05-17 LAB — VITAMIN D 25 HYDROXY (VIT D DEFICIENCY, FRACTURES): Vit D, 25-Hydroxy: <6 ng/mL — ABNORMAL LOW (ref 30–100)

## 2024-05-17 MED ORDER — VITAMIN D (ERGOCALCIFEROL) 1.25 MG (50000 UNIT) PO CAPS
50000.0000 [IU] | ORAL_CAPSULE | ORAL | Status: AC
  Administered 2024-05-18: 50000 [IU] via ORAL

## 2024-05-17 MED ORDER — SODIUM CHLORIDE 0.9% IV SOLUTION: Freq: Once | INTRAVENOUS | Status: AC

## 2024-05-17 MED ORDER — SODIUM CHLORIDE 0.9% IV SOLUTION: Freq: Once | INTRAVENOUS | Status: DC

## 2024-05-17 MED ORDER — SODIUM CHLORIDE 0.9 % IV SOLN: INTRAVENOUS | Status: AC

## 2024-05-17 NOTE — Progress Notes (Signed)
 [facility] Progress Note  Patient: Casey Johnston    FMW:[id number]  DOA: 05/06/2024     Date of Service: the patient was seen and examined on 05/17/2024  Chief Complaint  Patient presents with   Hematochezia   Brief hospital course:  Medical records reviewed and are as summarized below:   Casey Johnston is a 63 y.o. female  with a past medical history significant for type 2 diabetes, HTN, HLD, OSA on no longer utilizing CPAP, GERD and anxiety who presented to the ED at [facility] 12/17 for complaints of shortness of breath and hematochezia.  Of note patient has history of gastric and small bowel AVMs with active GI bleed in the past, recent colonoscopy with diverticular disease.  Patient was admitted per hospitalist.   Patient seen with active bleed overnight 12/19 with resultant hypotension and tachycardia resulting in transfer to ICU for close monitoring     Significant Hospital Events: Including procedures, antibiotic start and stop dates in addition to other pertinent events   12/17 presented to [name] ED with hematochezia and shortness of breath 12/18 CT angio with active contrast extravasation involving the hepatic flexure of the colon consistent with active GI bleed > interventional radiology consulted.  Unfortunately patient remains at [facility] and was unable to undergo IR evaluation.  Patient arrived to [phone] at 1913 12/19 ongoing GI bleed overnight with hypotension and tachycardia.  Underwent embolization of right colic branches 12/20  Softer blood pressure today morning.  Had an episode of medium sized bowel movement with bright red blood.  Hemoglobin down to 6.4.  Given 2 units PRBC.  Surgery and GI consulted.  Repeat CTA done 12/23 colonoscopy. Ulcer found, clips placed. 1 PRBC for hgb dip likely reflectie of old blood seen during scope  12/24 HDS hgb stable txf to progressive 12/25 Developed bleed again overnight, the platelet this is less than third  time of the trauma she has not had any more bowel movements 5.9. CTA with continued bleed at hepatic flexure and got repeat embolization by IR.    Assessment and Plan:  # Acute recurrent lower GI bleeding: S/p IR embolization x 2 (12/19 and 04/2024).  S/p colonoscopy with clip hepatic flexure ulcer on 05/12/2024, right colon angiodysplasia. Follow-up with interventional radiologist and general surgeon.     # Acute blood loss anemia: Patient was transfusion related to PRBCs on 05/16/2024.  Hemoglobin up from 5.6-6.7-7.6.   S/p transfusion with multiple units of PRBCs. Iron profile, B12 and folate within normal range 12/28 Hb  6.6 > 1 unit PRBC> 9.8>8.3   Type II DM with hyperglycemia: Continue Lantus .  NovoLog  as needed for hyperglycemia.     Hyponatremia: Improved     Leukocytosis: Improved   Comorbidities include anxiety, class I obesity   Hypocalcemia could be due to multiple PRBC transfusions Will check vitamin D  level Monitor calcium  level daily    Body mass index is 32.45 kg/m.  Nutrition Problem: Inadequate oral intake Etiology: acute illness Interventions: Interventions: Refer to RD note for recommendations   Diet: Full liquid diet DVT Prophylaxis: SCDs  Advance goals of care discussion: Full code  Family Communication: family was not present at bedside, at the time of interview.  The pt provided permission to discuss medical plan with the family. Opportunity was given to ask question and all questions were answered satisfactorily.   Disposition:  Pt is from Home, admitted with acute GI bleeding, still has risk of bleeding, which precludes a  safe discharge. Discharge to home, when stable, may need 1-2 more days to improve.  Subjective: No significant events overnight.  Patient had small amount of BM which had blood, red and blackish in color.  Denied any abdominal pain, no any other complaints. Patient was hungry so she wanted to eat food, was not happy with  the clear liquid diet so started on for liquid diet today.  Physical Exam: General: NAD, lying comfortably Appear in no distress, affect appropriate Eyes: PERRLA ENT: Oral Mucosa Clear, moist  Neck: no JVD,  Cardiovascular: S1 and S2 Present, no Murmur,  Respiratory: good respiratory effort, Bilateral Air entry equal and Decreased, no Crackles, no wheezes Abdomen: Bowel Sound present, Soft and no tenderness,  Skin: no rashes Extremities: no Pedal edema, no calf tenderness Neurologic: without any new focal findings Gait not checked due to patient safety concerns  Vitals:   05/17/24 1300 05/17/24 1400 05/17/24 1500 05/17/24 1600  BP: (!) 115/41 (!) 122/43 (!) 130/46 (!) 129/48  Pulse: 64 63 69 69  Resp: 12 14 15 14   Temp:      TempSrc:      SpO2: 96% 99% 100% 100%  Weight:      Height:        Intake/Output Summary (Last 24 hours) at 05/17/2024 1742 Last data filed at 05/17/2024 0600 Gross per 24 hour  Intake 452.33 ml  Output --  Net 452.33 ml   Filed Weights   05/06/24 1132 05/12/24 0935  Weight: 88.5 kg 88.5 kg    Data Reviewed: I have personally reviewed and interpreted daily labs, tele strips, imagings as discussed above. I reviewed all nursing notes, pharmacy notes, vitals, pertinent old records I have discussed plan of care as described above with RN and patient/family.  CBC: Recent Labs  Lab 05/11/24 0538 05/11/24 0731 05/11/24 1253 05/13/24 0813 05/13/24 0934 05/16/24 0408 05/16/24 1001 05/16/24 2058 05/17/24 0046 05/17/24 0756 05/17/24 1223  WBC 13.5*  --   --  13.3*  --  10.9*  --  11.6*  --   --   --   HGB 6.1*  --    < > 7.1*   < > 6.7* 7.6* 8.0* 6.6* 9.8* 8.3*  HCT 17.5*  --    < > 20.8*   < > 19.3* 22.1* 23.4* 19.6* 29.2* 24.7*  MCV 85.4  --   --  87.8  --  85.8  --  88.6  --   --   --   PLT 85* 88*  --  145*  --  148*  --  168  --   --   --    < > = values in this interval not displayed.   Basic Metabolic Panel: Recent Labs  Lab  05/11/24 0538 05/13/24 0813 05/14/24 0128 05/16/24 0408  NA 132* 133*  --  136  K 4.5 4.1  --  4.2  CL 104 103  --  108  CO2 25 26  --  24  GLUCOSE 171* 184*  --  218*  BUN 19 8  --  14  CREATININE 0.79 0.68  --  0.67  CALCIUM  7.2* 7.3*  --  7.3*  MG 2.0 1.8 1.7  --   PHOS  --  2.7 2.7  --     Studies: No results found.  Scheduled Meds:  sodium chloride    Intravenous Once   atorvastatin   20 mg Oral Daily   Chlorhexidine  Gluconate Cloth  6 each Topical Daily  feeding supplement  1 Container Oral TID BM   insulin  aspart  0-15 Units Subcutaneous TID WC   insulin  aspart  0-5 Units Subcutaneous QHS   insulin  glargine  10 Units Subcutaneous Daily   multivitamin with minerals  1 tablet Oral Daily   pantoprazole   40 mg Oral BID   sodium chloride  flush  3 mL Intravenous Q12H   thiamine   100 mg Oral Daily   Continuous Infusions:  sodium chloride  50 mL/hr at 05/17/24 1717   PRN Meds: acetaminophen  **OR** acetaminophen , ALPRAZolam , diphenhydrAMINE , hydrALAZINE , HYDROcodone -acetaminophen , ondansetron  **OR** ondansetron  (ZOFRAN ) IV, prochlorperazine , sodium chloride  flush, traZODone   Time spent: 35 minutes  Author: ELVAN SOR. MD [facility] 05/17/2024 5:42 PM  To reach On-call, see care teams to locate the attending and reach out to them via [email or web address]. If 7PM-7AM, please contact night-coverage If you still have difficulty reaching the attending provider, please page the [facility] (Director on Call) for [facility] on amion for assistance.

## 2024-05-17 NOTE — Progress Notes (Signed)
" ° ° ° °  Patient Name: Casey Johnston           DOB: Jul 28, 1960  MRN: [id number]      Admission Date: 05/06/2024  Attending Provider: Jens Durand, MD  Primary Diagnosis: Lower GI bleed   Level of care: Med-Surg   OVERNIGHT EVENT   HPI/ Events of Note [name], 63 y.o. female, was admitted on 05/06/2024 for Lower GI bleed.   Acute Blood Loss Anemia Recurrent lower GI bleed s/p embolization x2 (12/19, 12/25)  Hemoglobin 8.0 -->  6.6.  No acute changes reported.  Hemodynamically stable, BP borderline. No melena, hematochezia, or other bleeding reported tonight.    Plan: Blood transfusion, 1 unit PRBC Recheck H&H after transfusion is completed, transfuse if HGB <7   Lavanda Horns, DNP, ACNPC- AG [facility] [place]  "

## 2024-05-17 NOTE — Progress Notes (Signed)
 "  Trauma/Critical Care Follow Up Note  Subjective:    Overnight Issues: Denies pain, mild nausea  Objective:  Vital signs for last 24 hours: Temp:  [97.9 F (36.6 C)-98.7 F (37.1 C)] 98 F (36.7 C) (12/28 0555) Pulse Rate:  [54-77] 65 (12/28 0700) Resp:  [10-24] 18 (12/28 0700) BP: (97-149)/(36-109) 132/109 (12/28 0700) SpO2:  [92 %-100 %] 100 % (12/28 0700)  Intake/Output from previous day: 12/27 0701 - 12/28 0700 In: 708.2 [I.V.:25; Blood:683.2] Out: -   Intake/Output this shift: No intake/output data recorded.   Physical Exam:  Gen: comfortable, no distress Neuro: follows commands, alert, communicative HEENT: PERRL Neck: supple CV: RRR Pulm: unlabored breathing on RA Abd: soft, NT, nD GU: urine clear and yellow, +spontaneous voids Extr: wwp, no edema  Results for orders placed or performed during the hospital encounter of 05/06/24 (from the past 24 hours)  Hemoglobin and hematocrit, blood     Status: Abnormal   Collection Time: 05/16/24 10:01 AM  Result Value Ref Range   Hemoglobin 7.6 (L) 12.0 - 15.0 g/dL   HCT 77.8 (L) 63.9 - 53.9 %  Glucose, capillary     Status: Abnormal   Collection Time: 05/16/24 11:58 AM  Result Value Ref Range   Glucose-Capillary 160 (H) 70 - 99 mg/dL  Glucose, capillary     Status: Abnormal   Collection Time: 05/16/24  4:15 PM  Result Value Ref Range   Glucose-Capillary 102 (H) 70 - 99 mg/dL  Glucose, capillary     Status: Abnormal   Collection Time: 05/16/24  8:14 PM  Result Value Ref Range   Glucose-Capillary 111 (H) 70 - 99 mg/dL  CBC     Status: Abnormal   Collection Time: 05/16/24  8:58 PM  Result Value Ref Range   WBC 11.6 (H) 4.0 - 10.5 K/uL   RBC 2.64 (L) 3.87 - 5.11 MIL/uL   Hemoglobin 8.0 (L) 12.0 - 15.0 g/dL   HCT 76.5 (L) 63.9 - 53.9 %   MCV 88.6 80.0 - 100.0 fL   MCH 30.3 26.0 - 34.0 pg   MCHC 34.2 30.0 - 36.0 g/dL   RDW 84.2 (H) 88.4 - 84.4 %   Platelets 168 150 - 400 K/uL   nRBC 0.0 0.0 - 0.2 %   Hemoglobin and hematocrit, blood     Status: Abnormal   Collection Time: 05/17/24 12:46 AM  Result Value Ref Range   Hemoglobin 6.6 (LL) 12.0 - 15.0 g/dL   HCT 80.3 (L) 63.9 - 53.9 %  Prepare RBC (crossmatch)     Status: None   Collection Time: 05/17/24  1:56 AM  Result Value Ref Range   Order Confirmation      ORDER PROCESSED BY BLOOD BANK Performed at [facility], [address] [place], [place] [address]   Prepare RBC (crossmatch)     Status: None   Collection Time: 05/17/24  3:35 AM  Result Value Ref Range   Order Confirmation      ORDER PROCESSED BY BLOOD BANK Performed at [facility], [address] [place], [place] [address]   Hemoglobin and hematocrit, blood     Status: Abnormal   Collection Time: 05/17/24  7:56 AM  Result Value Ref Range   Hemoglobin 9.8 (L) 12.0 - 15.0 g/dL   HCT 70.7 (L) 63.9 - 53.9 %  Glucose, capillary     Status: Abnormal   Collection Time: 05/17/24  8:01 AM  Result Value Ref Range  Glucose-Capillary 120 (H) 70 - 99 mg/dL    Assessment & Plan:  LOS: 11 days   Additional comments:I reviewed the patient's new clinical lab test results.   and I reviewed the patients new imaging test results.    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 Repeat CTA 12/25 AM with persistent bleed and underwent repeat AE. Hgb 9.8 this am but has continued to need intermittent transfusion. Per Dr. Jennefer, potential benefit of more proximal embo. We discussed, and I have also discussed with the patient, the potential risk of ongoing bleeding or resultant colonic ischemia necessitating surgical intervention, and she is agreeable if this becomes necessary.    FEN - NPO for IR VTE - hold for bleeding issues   Disposition - Would prefer to avoid surgery if possible. Possibly go for AE today. Consider iron+vitC to aid in erythropoiesis. Limit blood draws.   Casey DELENA Freund  MD Trauma & General Surgery Please use [email or web address] to contact on call provider  05/17/2024  *Care during the described time interval was provided by me. I have reviewed this patient's available data, including medical history, events of note, physical examination and test results as part of my evaluation.    "

## 2024-05-17 NOTE — Progress Notes (Signed)
 "   Referring Physician(s): Dr. Dreama Hanger  Supervising Physician: Jennefer Rover  Patient Status:  [facility] - In-pt  Chief Complaint: GI bleed  Subjective: Patient with small episode of BRBPR yesterday.  Subsequently her Hgb dropped from 8.0  6.6, however recheck after 1u PRBC was 9.8.  Patient alert, oriented, no abdominal pain/cramping.  VSS.  Allergies: Januvia  [sitagliptin ] and Amlodipine   Medications: Prior to Admission medications  Medication Sig Start Date End Date Taking? Authorizing Provider  atorvastatin  (LIPITOR) 20 MG tablet TAKE 1 Tablet BY MOUTH ONCE EVERY DAY Patient taking differently: Take 20 mg by mouth daily. TAKE 1 Tablet BY MOUTH ONCE EVERY DAY 10/05/21  Yes Comer Kirsch, PA-C  cloNIDine  (CATAPRES ) 0.1 MG tablet Take 1 tablet (0.1 mg total) by mouth 2 (two) times daily. Patient taking differently: Take 0.1 mg by mouth daily. 10/05/21  Yes Comer Kirsch, PA-C  Dulaglutide (TRULICITY) 0.75 MG/0.5ML SOPN Inject 0.75 mg into the skin every Monday.   Yes [provider]  FEROSUL 325 (65 Fe) MG tablet Take 325 mg by mouth daily. 04/24/24  Yes [provider]  linaclotide  (LINZESS ) 72 MCG capsule Take 72 mcg by mouth daily as needed (constipation).   Yes [provider]  losartan -hydrochlorothiazide  (HYZAAR) 100-12.5 MG tablet Take 1 tablet by mouth daily. 11/09/22  Yes [provider]  metFORMIN  (GLUCOPHAGE ) 1000 MG tablet Take 1 tablet (1,000 mg total) by mouth 2 (two) times daily with a meal. 10/05/21  Yes Comer Kirsch, PA-C  pantoprazole  (PROTONIX ) 40 MG tablet Take 1 tablet (40 mg total) by mouth 2 (two) times daily before a meal. 04/12/24  Yes Sigdel, Santosh, MD  glipiZIDE  (GLUCOTROL  XL) 5 MG 24 hr tablet Take 1 tablet (5 mg total) by mouth daily with breakfast. 10/18/22   Lenis Ethelle ORN, MD     Vital Signs: BP (!) 132/109   Pulse 65   Temp 98.9 F (37.2 C) (Oral)   Resp 18   Ht 5' 5 (1.651 m)   Wt 195 lb  (88.5 kg)   SpO2 100%   BMI 32.45 kg/m   Physical Exam NAD, alert, fatigued but oriented.  Abdomen: soft, non-tender. No cramping.    Imaging: IR EMBO ART  VEN HEMORR LYMPH EXTRAV  INC GUIDE ROADMAPPING Result Date: 05/14/2024 INDICATION: Recurrent acute right colon lower GI bleeding by CTA, associated blood-loss anemia, status post prior angio embolization and endoscopic clipping EXAM: ULTRASOUND GUIDANCE FOR VASCULAR ACCESS SMA, RIGHT COLIC, MARGINAL BRANCH, AND 2 PERIPHERAL SEPARATE VASA RECTA BRANCHES CATHETERIZATIONS, ANGIOGRAMS, AND ADDITIONAL PERIPHERAL MICRO COIL EMBOLIZATION MEDICATIONS: 1% lidocaine  local. ANESTHESIA/SEDATION: Moderate (conscious) sedation was employed during this procedure. A total of Versed  1.5 mg and Fentanyl  50 mcg was administered intravenously by the radiology nurse. Total intra-service moderate Sedation Time: 60 minutes. The patient's level of consciousness and vital signs were monitored continuously by radiology nursing throughout the procedure under my direct supervision. CONTRAST:  85 cc omni 300 FLUOROSCOPY: Radiation Exposure Index (as provided by the fluoroscopic device): 2,418 mGy Kerma COMPLICATIONS: None immediate. PROCEDURE: Informed consent was obtained from the patient following explanation of the procedure, risks, benefits and alternatives. The patient understands, agrees and consents for the procedure. All questions were addressed. A time out was performed prior to the initiation of the procedure. Maximal barrier sterile technique utilized including caps, mask, sterile gowns, sterile gloves, large sterile drape, hand hygiene, and Betadine  prep. Previous imaging reviewed. Under sterile conditions and local anesthesia, ultrasound micropuncture access performed of the patent right common  femoral artery. Images obtained for documentation of the patent right common femoral artery. Five French sheath inserted over a Bentson guidewire. C2 catheter advanced to  select the SMA. Repeat SMA angiogram performed. SMA: SMA origin remains widely patent including the colic and jejunal branches. Initially no active bleeding demonstrated in the previously embolized right hepatic flexure distribution. Progreat single angle microcatheter was advanced through the C2 catheter into the SMA trunk. Fathom guidewire again utilized to select the right colic branch. Selective right colic angiogram performed. Right colic: Right colic remains patent leading into the right marginal artery. Peripheral Vasa recta branches to the hepatic flexure remain patent. Initially no active bleeding demonstrated. Microcatheter retracted and utilized to select the origin of 1 of the previously embolized 3 branches. Right vasa recta branch (1): Injection of the previously embolized branch demonstrates no active bleeding. Microcatheter retracted and utilized to select the origin of a second embolized branch. Right Vasa recta branch (2): This branch has been previously embolized however with delayed imaging, there is a rounded vascular blush distal to the coils which correlates with the CTA earlier today. Additional micro embolization: The origin of this branch was successfully embolized with the deployment of 2 additional coils (first coil was a 2 mm x 2 cm Terumo Azur coil followed by a 1 mm x 2 cm Ruby low-profile coil. This completely embolized the branch back to its origin off of the marginal artery. Final completion angiogram no longer demonstrates the abnormal vascularity/blushing. Additional angiography within the marginal branch demonstrated no additional abnormal vascularity with preservation of the marginal artery of [place] to the right colon. At this point the microcatheter was removed. C2 catheter also removed. Hemostasis obtained at the right CFA puncture site with the CELT device. Patient tolerated the procedure well.  No immediate complication. IMPRESSION: Additional successful peripheral micro  coil embolization of a right Vasa recta branch off of the SMA at the site of right hepatic flexure mild arterial oozing demonstrated by CTA earlier today. Electronically Signed   By: CHRISTELLA.  Shick M.D.   On: 05/14/2024 17:03   IR Angiogram Visceral Selective Result Date: 05/14/2024 INDICATION: Recurrent acute right colon lower GI bleeding by CTA, associated blood-loss anemia, status post prior angio embolization and endoscopic clipping EXAM: ULTRASOUND GUIDANCE FOR VASCULAR ACCESS SMA, RIGHT COLIC, MARGINAL BRANCH, AND 2 PERIPHERAL SEPARATE VASA RECTA BRANCHES CATHETERIZATIONS, ANGIOGRAMS, AND ADDITIONAL PERIPHERAL MICRO COIL EMBOLIZATION MEDICATIONS: 1% lidocaine  local. ANESTHESIA/SEDATION: Moderate (conscious) sedation was employed during this procedure. A total of Versed  1.5 mg and Fentanyl  50 mcg was administered intravenously by the radiology nurse. Total intra-service moderate Sedation Time: 60 minutes. The patient's level of consciousness and vital signs were monitored continuously by radiology nursing throughout the procedure under my direct supervision. CONTRAST:  85 cc omni 300 FLUOROSCOPY: Radiation Exposure Index (as provided by the fluoroscopic device): 2,418 mGy Kerma COMPLICATIONS: None immediate. PROCEDURE: Informed consent was obtained from the patient following explanation of the procedure, risks, benefits and alternatives. The patient understands, agrees and consents for the procedure. All questions were addressed. A time out was performed prior to the initiation of the procedure. Maximal barrier sterile technique utilized including caps, mask, sterile gowns, sterile gloves, large sterile drape, hand hygiene, and Betadine  prep. Previous imaging reviewed. Under sterile conditions and local anesthesia, ultrasound micropuncture access performed of the patent right common femoral artery. Images obtained for documentation of the patent right common femoral artery. Five French sheath inserted over a  Bentson guidewire. C2 catheter advanced to select the  SMA. Repeat SMA angiogram performed. SMA: SMA origin remains widely patent including the colic and jejunal branches. Initially no active bleeding demonstrated in the previously embolized right hepatic flexure distribution. Progreat single angle microcatheter was advanced through the C2 catheter into the SMA trunk. Fathom guidewire again utilized to select the right colic branch. Selective right colic angiogram performed. Right colic: Right colic remains patent leading into the right marginal artery. Peripheral Vasa recta branches to the hepatic flexure remain patent. Initially no active bleeding demonstrated. Microcatheter retracted and utilized to select the origin of 1 of the previously embolized 3 branches. Right vasa recta branch (1): Injection of the previously embolized branch demonstrates no active bleeding. Microcatheter retracted and utilized to select the origin of a second embolized branch. Right Vasa recta branch (2): This branch has been previously embolized however with delayed imaging, there is a rounded vascular blush distal to the coils which correlates with the CTA earlier today. Additional micro embolization: The origin of this branch was successfully embolized with the deployment of 2 additional coils (first coil was a 2 mm x 2 cm Terumo Azur coil followed by a 1 mm x 2 cm Ruby low-profile coil. This completely embolized the branch back to its origin off of the marginal artery. Final completion angiogram no longer demonstrates the abnormal vascularity/blushing. Additional angiography within the marginal branch demonstrated no additional abnormal vascularity with preservation of the marginal artery of [place] to the right colon. At this point the microcatheter was removed. C2 catheter also removed. Hemostasis obtained at the right CFA puncture site with the CELT device. Patient tolerated the procedure well.  No immediate complication.  IMPRESSION: Additional successful peripheral micro coil embolization of a right Vasa recta branch off of the SMA at the site of right hepatic flexure mild arterial oozing demonstrated by CTA earlier today. Electronically Signed   By: CHRISTELLA.  Shick M.D.   On: 05/14/2024 17:03   IR Angiogram Selective Each Additional Vessel Result Date: 05/14/2024 INDICATION: Recurrent acute right colon lower GI bleeding by CTA, associated blood-loss anemia, status post prior angio embolization and endoscopic clipping EXAM: ULTRASOUND GUIDANCE FOR VASCULAR ACCESS SMA, RIGHT COLIC, MARGINAL BRANCH, AND 2 PERIPHERAL SEPARATE VASA RECTA BRANCHES CATHETERIZATIONS, ANGIOGRAMS, AND ADDITIONAL PERIPHERAL MICRO COIL EMBOLIZATION MEDICATIONS: 1% lidocaine  local. ANESTHESIA/SEDATION: Moderate (conscious) sedation was employed during this procedure. A total of Versed  1.5 mg and Fentanyl  50 mcg was administered intravenously by the radiology nurse. Total intra-service moderate Sedation Time: 60 minutes. The patient's level of consciousness and vital signs were monitored continuously by radiology nursing throughout the procedure under my direct supervision. CONTRAST:  85 cc omni 300 FLUOROSCOPY: Radiation Exposure Index (as provided by the fluoroscopic device): 2,418 mGy Kerma COMPLICATIONS: None immediate. PROCEDURE: Informed consent was obtained from the patient following explanation of the procedure, risks, benefits and alternatives. The patient understands, agrees and consents for the procedure. All questions were addressed. A time out was performed prior to the initiation of the procedure. Maximal barrier sterile technique utilized including caps, mask, sterile gowns, sterile gloves, large sterile drape, hand hygiene, and Betadine  prep. Previous imaging reviewed. Under sterile conditions and local anesthesia, ultrasound micropuncture access performed of the patent right common femoral artery. Images obtained for documentation of the patent  right common femoral artery. Five French sheath inserted over a Bentson guidewire. C2 catheter advanced to select the SMA. Repeat SMA angiogram performed. SMA: SMA origin remains widely patent including the colic and jejunal branches. Initially no active bleeding demonstrated in the previously embolized  right hepatic flexure distribution. Progreat single angle microcatheter was advanced through the C2 catheter into the SMA trunk. Fathom guidewire again utilized to select the right colic branch. Selective right colic angiogram performed. Right colic: Right colic remains patent leading into the right marginal artery. Peripheral Vasa recta branches to the hepatic flexure remain patent. Initially no active bleeding demonstrated. Microcatheter retracted and utilized to select the origin of 1 of the previously embolized 3 branches. Right vasa recta branch (1): Injection of the previously embolized branch demonstrates no active bleeding. Microcatheter retracted and utilized to select the origin of a second embolized branch. Right Vasa recta branch (2): This branch has been previously embolized however with delayed imaging, there is a rounded vascular blush distal to the coils which correlates with the CTA earlier today. Additional micro embolization: The origin of this branch was successfully embolized with the deployment of 2 additional coils (first coil was a 2 mm x 2 cm Terumo Azur coil followed by a 1 mm x 2 cm Ruby low-profile coil. This completely embolized the branch back to its origin off of the marginal artery. Final completion angiogram no longer demonstrates the abnormal vascularity/blushing. Additional angiography within the marginal branch demonstrated no additional abnormal vascularity with preservation of the marginal artery of [place] to the right colon. At this point the microcatheter was removed. C2 catheter also removed. Hemostasis obtained at the right CFA puncture site with the CELT device. Patient  tolerated the procedure well.  No immediate complication. IMPRESSION: Additional successful peripheral micro coil embolization of a right Vasa recta branch off of the SMA at the site of right hepatic flexure mild arterial oozing demonstrated by CTA earlier today. Electronically Signed   By: CHRISTELLA.  Shick M.D.   On: 05/14/2024 17:03   IR Angiogram Selective Each Additional Vessel Result Date: 05/14/2024 INDICATION: Recurrent acute right colon lower GI bleeding by CTA, associated blood-loss anemia, status post prior angio embolization and endoscopic clipping EXAM: ULTRASOUND GUIDANCE FOR VASCULAR ACCESS SMA, RIGHT COLIC, MARGINAL BRANCH, AND 2 PERIPHERAL SEPARATE VASA RECTA BRANCHES CATHETERIZATIONS, ANGIOGRAMS, AND ADDITIONAL PERIPHERAL MICRO COIL EMBOLIZATION MEDICATIONS: 1% lidocaine  local. ANESTHESIA/SEDATION: Moderate (conscious) sedation was employed during this procedure. A total of Versed  1.5 mg and Fentanyl  50 mcg was administered intravenously by the radiology nurse. Total intra-service moderate Sedation Time: 60 minutes. The patient's level of consciousness and vital signs were monitored continuously by radiology nursing throughout the procedure under my direct supervision. CONTRAST:  85 cc omni 300 FLUOROSCOPY: Radiation Exposure Index (as provided by the fluoroscopic device): 2,418 mGy Kerma COMPLICATIONS: None immediate. PROCEDURE: Informed consent was obtained from the patient following explanation of the procedure, risks, benefits and alternatives. The patient understands, agrees and consents for the procedure. All questions were addressed. A time out was performed prior to the initiation of the procedure. Maximal barrier sterile technique utilized including caps, mask, sterile gowns, sterile gloves, large sterile drape, hand hygiene, and Betadine  prep. Previous imaging reviewed. Under sterile conditions and local anesthesia, ultrasound micropuncture access performed of the patent right common femoral  artery. Images obtained for documentation of the patent right common femoral artery. Five French sheath inserted over a Bentson guidewire. C2 catheter advanced to select the SMA. Repeat SMA angiogram performed. SMA: SMA origin remains widely patent including the colic and jejunal branches. Initially no active bleeding demonstrated in the previously embolized right hepatic flexure distribution. Progreat single angle microcatheter was advanced through the C2 catheter into the SMA trunk. Fathom guidewire again utilized to select the right  colic branch. Selective right colic angiogram performed. Right colic: Right colic remains patent leading into the right marginal artery. Peripheral Vasa recta branches to the hepatic flexure remain patent. Initially no active bleeding demonstrated. Microcatheter retracted and utilized to select the origin of 1 of the previously embolized 3 branches. Right vasa recta branch (1): Injection of the previously embolized branch demonstrates no active bleeding. Microcatheter retracted and utilized to select the origin of a second embolized branch. Right Vasa recta branch (2): This branch has been previously embolized however with delayed imaging, there is a rounded vascular blush distal to the coils which correlates with the CTA earlier today. Additional micro embolization: The origin of this branch was successfully embolized with the deployment of 2 additional coils (first coil was a 2 mm x 2 cm Terumo Azur coil followed by a 1 mm x 2 cm Ruby low-profile coil. This completely embolized the branch back to its origin off of the marginal artery. Final completion angiogram no longer demonstrates the abnormal vascularity/blushing. Additional angiography within the marginal branch demonstrated no additional abnormal vascularity with preservation of the marginal artery of [place] to the right colon. At this point the microcatheter was removed. C2 catheter also removed. Hemostasis obtained at the  right CFA puncture site with the CELT device. Patient tolerated the procedure well.  No immediate complication. IMPRESSION: Additional successful peripheral micro coil embolization of a right Vasa recta branch off of the SMA at the site of right hepatic flexure mild arterial oozing demonstrated by CTA earlier today. Electronically Signed   By: CHRISTELLA.  Shick M.D.   On: 05/14/2024 17:03   IR Angiogram Selective Each Additional Vessel Result Date: 05/14/2024 INDICATION: Recurrent acute right colon lower GI bleeding by CTA, associated blood-loss anemia, status post prior angio embolization and endoscopic clipping EXAM: ULTRASOUND GUIDANCE FOR VASCULAR ACCESS SMA, RIGHT COLIC, MARGINAL BRANCH, AND 2 PERIPHERAL SEPARATE VASA RECTA BRANCHES CATHETERIZATIONS, ANGIOGRAMS, AND ADDITIONAL PERIPHERAL MICRO COIL EMBOLIZATION MEDICATIONS: 1% lidocaine  local. ANESTHESIA/SEDATION: Moderate (conscious) sedation was employed during this procedure. A total of Versed  1.5 mg and Fentanyl  50 mcg was administered intravenously by the radiology nurse. Total intra-service moderate Sedation Time: 60 minutes. The patient's level of consciousness and vital signs were monitored continuously by radiology nursing throughout the procedure under my direct supervision. CONTRAST:  85 cc omni 300 FLUOROSCOPY: Radiation Exposure Index (as provided by the fluoroscopic device): 2,418 mGy Kerma COMPLICATIONS: None immediate. PROCEDURE: Informed consent was obtained from the patient following explanation of the procedure, risks, benefits and alternatives. The patient understands, agrees and consents for the procedure. All questions were addressed. A time out was performed prior to the initiation of the procedure. Maximal barrier sterile technique utilized including caps, mask, sterile gowns, sterile gloves, large sterile drape, hand hygiene, and Betadine  prep. Previous imaging reviewed. Under sterile conditions and local anesthesia, ultrasound  micropuncture access performed of the patent right common femoral artery. Images obtained for documentation of the patent right common femoral artery. Five French sheath inserted over a Bentson guidewire. C2 catheter advanced to select the SMA. Repeat SMA angiogram performed. SMA: SMA origin remains widely patent including the colic and jejunal branches. Initially no active bleeding demonstrated in the previously embolized right hepatic flexure distribution. Progreat single angle microcatheter was advanced through the C2 catheter into the SMA trunk. Fathom guidewire again utilized to select the right colic branch. Selective right colic angiogram performed. Right colic: Right colic remains patent leading into the right marginal artery. Peripheral Vasa recta branches to the hepatic  flexure remain patent. Initially no active bleeding demonstrated. Microcatheter retracted and utilized to select the origin of 1 of the previously embolized 3 branches. Right vasa recta branch (1): Injection of the previously embolized branch demonstrates no active bleeding. Microcatheter retracted and utilized to select the origin of a second embolized branch. Right Vasa recta branch (2): This branch has been previously embolized however with delayed imaging, there is a rounded vascular blush distal to the coils which correlates with the CTA earlier today. Additional micro embolization: The origin of this branch was successfully embolized with the deployment of 2 additional coils (first coil was a 2 mm x 2 cm Terumo Azur coil followed by a 1 mm x 2 cm Ruby low-profile coil. This completely embolized the branch back to its origin off of the marginal artery. Final completion angiogram no longer demonstrates the abnormal vascularity/blushing. Additional angiography within the marginal branch demonstrated no additional abnormal vascularity with preservation of the marginal artery of [place] to the right colon. At this point the microcatheter  was removed. C2 catheter also removed. Hemostasis obtained at the right CFA puncture site with the CELT device. Patient tolerated the procedure well.  No immediate complication. IMPRESSION: Additional successful peripheral micro coil embolization of a right Vasa recta branch off of the SMA at the site of right hepatic flexure mild arterial oozing demonstrated by CTA earlier today. Electronically Signed   By: CHRISTELLA.  Shick M.D.   On: 05/14/2024 17:03   IR Angiogram Selective Each Additional Vessel Result Date: 05/14/2024 INDICATION: Recurrent acute right colon lower GI bleeding by CTA, associated blood-loss anemia, status post prior angio embolization and endoscopic clipping EXAM: ULTRASOUND GUIDANCE FOR VASCULAR ACCESS SMA, RIGHT COLIC, MARGINAL BRANCH, AND 2 PERIPHERAL SEPARATE VASA RECTA BRANCHES CATHETERIZATIONS, ANGIOGRAMS, AND ADDITIONAL PERIPHERAL MICRO COIL EMBOLIZATION MEDICATIONS: 1% lidocaine  local. ANESTHESIA/SEDATION: Moderate (conscious) sedation was employed during this procedure. A total of Versed  1.5 mg and Fentanyl  50 mcg was administered intravenously by the radiology nurse. Total intra-service moderate Sedation Time: 60 minutes. The patient's level of consciousness and vital signs were monitored continuously by radiology nursing throughout the procedure under my direct supervision. CONTRAST:  85 cc omni 300 FLUOROSCOPY: Radiation Exposure Index (as provided by the fluoroscopic device): 2,418 mGy Kerma COMPLICATIONS: None immediate. PROCEDURE: Informed consent was obtained from the patient following explanation of the procedure, risks, benefits and alternatives. The patient understands, agrees and consents for the procedure. All questions were addressed. A time out was performed prior to the initiation of the procedure. Maximal barrier sterile technique utilized including caps, mask, sterile gowns, sterile gloves, large sterile drape, hand hygiene, and Betadine  prep. Previous imaging reviewed.  Under sterile conditions and local anesthesia, ultrasound micropuncture access performed of the patent right common femoral artery. Images obtained for documentation of the patent right common femoral artery. Five French sheath inserted over a Bentson guidewire. C2 catheter advanced to select the SMA. Repeat SMA angiogram performed. SMA: SMA origin remains widely patent including the colic and jejunal branches. Initially no active bleeding demonstrated in the previously embolized right hepatic flexure distribution. Progreat single angle microcatheter was advanced through the C2 catheter into the SMA trunk. Fathom guidewire again utilized to select the right colic branch. Selective right colic angiogram performed. Right colic: Right colic remains patent leading into the right marginal artery. Peripheral Vasa recta branches to the hepatic flexure remain patent. Initially no active bleeding demonstrated. Microcatheter retracted and utilized to select the origin of 1 of the previously embolized 3 branches. Right vasa  recta branch (1): Injection of the previously embolized branch demonstrates no active bleeding. Microcatheter retracted and utilized to select the origin of a second embolized branch. Right Vasa recta branch (2): This branch has been previously embolized however with delayed imaging, there is a rounded vascular blush distal to the coils which correlates with the CTA earlier today. Additional micro embolization: The origin of this branch was successfully embolized with the deployment of 2 additional coils (first coil was a 2 mm x 2 cm Terumo Azur coil followed by a 1 mm x 2 cm Ruby low-profile coil. This completely embolized the branch back to its origin off of the marginal artery. Final completion angiogram no longer demonstrates the abnormal vascularity/blushing. Additional angiography within the marginal branch demonstrated no additional abnormal vascularity with preservation of the marginal artery of  Drummond to the right colon. At this point the microcatheter was removed. C2 catheter also removed. Hemostasis obtained at the right CFA puncture site with the CELT device. Patient tolerated the procedure well.  No immediate complication. IMPRESSION: Additional successful peripheral micro coil embolization of a right Vasa recta branch off of the SMA at the site of right hepatic flexure mild arterial oozing demonstrated by CTA earlier today. Electronically Signed   By: CHRISTELLA.  Shick M.D.   On: 05/14/2024 17:03   IR US  Guide Vasc Access Right Result Date: 05/14/2024 INDICATION: Recurrent acute right colon lower GI bleeding by CTA, associated blood-loss anemia, status post prior angio embolization and endoscopic clipping EXAM: ULTRASOUND GUIDANCE FOR VASCULAR ACCESS SMA, RIGHT COLIC, MARGINAL BRANCH, AND 2 PERIPHERAL SEPARATE VASA RECTA BRANCHES CATHETERIZATIONS, ANGIOGRAMS, AND ADDITIONAL PERIPHERAL MICRO COIL EMBOLIZATION MEDICATIONS: 1% lidocaine  local. ANESTHESIA/SEDATION: Moderate (conscious) sedation was employed during this procedure. A total of Versed  1.5 mg and Fentanyl  50 mcg was administered intravenously by the radiology nurse. Total intra-service moderate Sedation Time: 60 minutes. The patient's level of consciousness and vital signs were monitored continuously by radiology nursing throughout the procedure under my direct supervision. CONTRAST:  85 cc omni 300 FLUOROSCOPY: Radiation Exposure Index (as provided by the fluoroscopic device): 2,418 mGy Kerma COMPLICATIONS: None immediate. PROCEDURE: Informed consent was obtained from the patient following explanation of the procedure, risks, benefits and alternatives. The patient understands, agrees and consents for the procedure. All questions were addressed. A time out was performed prior to the initiation of the procedure. Maximal barrier sterile technique utilized including caps, mask, sterile gowns, sterile gloves, large sterile drape, hand hygiene, and  Betadine  prep. Previous imaging reviewed. Under sterile conditions and local anesthesia, ultrasound micropuncture access performed of the patent right common femoral artery. Images obtained for documentation of the patent right common femoral artery. Five French sheath inserted over a Bentson guidewire. C2 catheter advanced to select the SMA. Repeat SMA angiogram performed. SMA: SMA origin remains widely patent including the colic and jejunal branches. Initially no active bleeding demonstrated in the previously embolized right hepatic flexure distribution. Progreat single angle microcatheter was advanced through the C2 catheter into the SMA trunk. Fathom guidewire again utilized to select the right colic branch. Selective right colic angiogram performed. Right colic: Right colic remains patent leading into the right marginal artery. Peripheral Vasa recta branches to the hepatic flexure remain patent. Initially no active bleeding demonstrated. Microcatheter retracted and utilized to select the origin of 1 of the previously embolized 3 branches. Right vasa recta branch (1): Injection of the previously embolized branch demonstrates no active bleeding. Microcatheter retracted and utilized to select the origin of a second embolized branch.  Right Vasa recta branch (2): This branch has been previously embolized however with delayed imaging, there is a rounded vascular blush distal to the coils which correlates with the CTA earlier today. Additional micro embolization: The origin of this branch was successfully embolized with the deployment of 2 additional coils (first coil was a 2 mm x 2 cm Terumo Azur coil followed by a 1 mm x 2 cm Ruby low-profile coil. This completely embolized the branch back to its origin off of the marginal artery. Final completion angiogram no longer demonstrates the abnormal vascularity/blushing. Additional angiography within the marginal branch demonstrated no additional abnormal vascularity with  preservation of the marginal artery of Drummond to the right colon. At this point the microcatheter was removed. C2 catheter also removed. Hemostasis obtained at the right CFA puncture site with the CELT device. Patient tolerated the procedure well.  No immediate complication. IMPRESSION: Additional successful peripheral micro coil embolization of a right Vasa recta branch off of the SMA at the site of right hepatic flexure mild arterial oozing demonstrated by CTA earlier today. Electronically Signed   By: CHRISTELLA.  Shick M.D.   On: 05/14/2024 17:03   CT Angio Abd/Pel w/ and/or w/o Result Date: 05/14/2024 EXAM: CTA ABDOMEN AND PELVIS WITH AND WITHOUT CONTRAST 05/14/2024 05:19:00 AM TECHNIQUE: CTA images of the abdomen and pelvis with and without intravenous contrast. Three-dimensional MIP/volume rendered formations were performed. Automated exposure control, iterative reconstruction, and/or weight based adjustment of the mA/kV was utilized to reduce the radiation dose to as low as reasonably achievable. 75 mL (iohexol  (OMNIPAQUE ) 350 MG/ML injection 75 mL IOHEXOL  350 MG/ML SOLN) was administered. COMPARISON: CTA abdomen and pelvis, 2 studies recently dated 05/11/2024 and 05/09/2024. CLINICAL HISTORY: Lower GI bleed with active bleeding at the hepatic flexure on the last CTA abdomen and pelvis. FINDINGS: VASCULATURE: GI BLEED: Active bleeding is again noted into the hepatic flexure just below embolization coils of SMA branches to the hepatic flexure. The active hemorrhage is best seen on axial series 7 images 97 through 99, axial series 10 images 94 through 100, and coronal reconstruction series 19 image 90. No other active GI bleeding is seen on exam. AORTA: Minimal aortic calcific plaques without an aneurysm, dissection, or stenosis. CELIAC TRUNK: The celiac trunk is widely patent. There are nonstenosing ostial calcific plaques. SUPERIOR MESENTERIC ARTERY: The superior mesenteric artery is widely patent. There are  nonstenosing ostial calcific plaques. INFERIOR MESENTERIC ARTERY: No acute finding. No occlusion or significant stenosis. RENAL ARTERIES: No acute finding. No occlusion or significant stenosis. ILIAC ARTERIES: The bilateral iliac arteries are patent with no occlusion or significant stenosis. There are trace calcifications in the common and the aortic arteries. More prominent patchy calcific plaques in the internal iliac arteries. No significant disease in the external iliac arteries. ABDOMEN/PELVIS: LOWER CHEST: Coarse atelectatic changes at the lower lobes. Lung bases are clear of infiltrates. Decreased density of the cardiac blood pool consistent with anemia. Mild cardiomegaly. LIVER: The liver is unremarkable. GALLBLADDER AND BILE DUCTS: Gallbladder is unremarkable. No biliary ductal dilatation. SPLEEN: The spleen is unremarkable. PANCREAS: The pancreas is unremarkable. ADRENAL GLANDS: Bilateral adrenal glands demonstrate no acute abnormality. No adrenal mass. KIDNEYS, URETERS AND BLADDER: No stones in the kidneys or ureters. No hydronephrosis. No perinephric or periureteral stranding. Urinary bladder is unremarkable. No renal mass. 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 GI AND BOWEL: There is increased generalized fold thickening in the stomach. There are increased perigastric edematous changes. Findings could  be from simple gastritis or inflammatory peptic ulcer disease, but whatever the etiology, this was not seen previously. No gastric pneumatosis or portal venous gas is seen. The small bowel is of normal caliber without inflammatory change. The appendix is unremarkable. As above, embolization coils are noted medial to the hepatic flexure with adjacent active GI bleed into the hepatic flexure which was seen 3 days ago. The large bowel wall is of normal thickness. There is sigmoid diverticulosis without evidence of diverticulitis. There is no bowel obstruction. No abnormal bowel wall  thickening or distension. REPRODUCTIVE: The uterus is surgically absent. No adnexal mass. PERITONEUM AND RETROPERITONEUM: There is a small amount of free ascites around the liver and in the pelvis. No free hemorrhage or free air is seen. No incarcerated hernia. Small fat-containing umbilical hernia. LYMPH NODES: No lymphadenopathy. 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 No acute or significant osseous findings. There is increased body wall anasarca. IMPRESSION: 1. Active GI bleeding into the hepatic flexure adjacent to embolization coils, as seen on prior study 3 days ago. No other active GI bleeding. 2. Increased generalized fold thickening in the stomach and increased perigastric edematous changes, not seen previously. Findings could be from gastritis or inflammatory peptic ulcer disease. 3. . Small volume of increased ascites, with increased body wall anasarca. 4. Bosniak 1 and 2 renal cysts. Electronically signed by: Francis Quam MD 05/14/2024 06:02 AM EST RP Workstation: [id number]    Labs:  CBC: Recent Labs    05/11/24 0538 05/11/24 0731 05/11/24 1253 05/13/24 0813 05/13/24 9065 05/16/24 0408 05/16/24 1001 05/16/24 2058 05/17/24 0046 05/17/24 0756  WBC 13.5*  --   --  13.3*  --  10.9*  --  11.6*  --   --   HGB 6.1*  --    < > 7.1*   < > 6.7* 7.6* 8.0* 6.6* 9.8*  HCT 17.5*  --    < > 20.8*   < > 19.3* 22.1* 23.4* 19.6* 29.2*  PLT 85* 88*  --  145*  --  148*  --  168  --   --    < > = values in this interval not displayed.    COAGS: Recent Labs    04/04/24 0621 05/11/24 0731  INR 1.0 1.0  APTT  --  24    BMP: Recent Labs    05/10/24 0627 05/11/24 0538 05/13/24 0813 05/16/24 0408  NA 131* 132* 133* 136  K 4.7 4.5 4.1 4.2  CL 104 104 103 108  CO2 21* 25 26 24   GLUCOSE 164* 171* 184* 218*  BUN 31* 19 8 14   CALCIUM  6.9* 7.2* 7.3* 7.3*  CREATININE 1.01* 0.79 0.68 0.67  GFRNONAA >60 >60 >60 >60     LIVER FUNCTION TESTS: Recent Labs    04/04/24 0621 05/06/24 1147 05/10/24 1830  BILITOT 0.2 0.3 0.3  AST 22 24 22   ALT 13 13 7   ALKPHOS 68 81 41  PROT 6.7 7.2 3.7*  ALBUMIN 3.8 3.9 2.1*    Assessment and Plan: Recurrent lower GI bleed s/p embolization x2 (12/19, 12/25) Patient with ongoing evidence of GI bleed despite embolization x2. She has required daily transfusions for drops in HgB <7 with only small volume BRBPR.   Last episode of BRB was yesterday afternoon.  No changes in vitals or mental state.  Patient is otherwise asymptomatic.  Additional embolization is being considered, however with only small amount of melena/hematochezia plan  has been for temporization with maximal medical management and resuscitation as needed to allow for auto-hemostasis process.  Initial CBC this AM showed another drop in Hgb from 8.0  6.6, however repeat HH now at 9.8 after only 1u PRBC.  Question whether Hgb 6.6 overnight is an accurate reflection. Bedside assessment reveals patient resting comfortably.  She is alert, no belly pain/cramping.  No further episodes of BRB.  After discussion with Dr. Jennefer, favor additional day of monitoring with ongoing lab trends. This was discussed with the patient at bedside. Ultimate goal is to avoid further complication or OR as able.   IR following.    Electronically Signed: Emmilyn Crooke Sue-Ellen Galileah Piggee, PA 05/17/2024, 9:59 AM   I spent a total of 15 Minutes at the the patient's bedside AND on the patient's hospital floor or unit, greater than 50% of which was counseling/coordinating care for GI bleed.      "

## 2024-05-18 DIAGNOSIS — K922 Gastrointestinal hemorrhage, unspecified: Secondary | ICD-10-CM | POA: Diagnosis not present

## 2024-05-18 LAB — BASIC METABOLIC PANEL WITH GFR
Anion gap: 4 — ABNORMAL LOW (ref 5–15)
BUN: 11 mg/dL (ref 8–23)
CO2: 27 mmol/L (ref 22–32)
Calcium: 7.9 mg/dL — ABNORMAL LOW (ref 8.9–10.3)
Chloride: 109 mmol/L (ref 98–111)
Creatinine, Ser: 0.68 mg/dL (ref 0.44–1.00)
GFR, Estimated: >60 mL/min
Glucose, Bld: 155 mg/dL — ABNORMAL HIGH (ref 70–99)
Potassium: 4.9 mmol/L (ref 3.5–5.1)
Sodium: 140 mmol/L (ref 135–145)

## 2024-05-18 LAB — CBC
HCT: 23.8 % — ABNORMAL LOW (ref 36.0–46.0)
Hemoglobin: 7.9 g/dL — ABNORMAL LOW (ref 12.0–15.0)
MCH: 30.4 pg (ref 26.0–34.0)
MCHC: 33.2 g/dL (ref 30.0–36.0)
MCV: 91.5 fL (ref 80.0–100.0)
Platelets: 163 K/uL (ref 150–400)
RBC: 2.6 MIL/uL — ABNORMAL LOW (ref 3.87–5.11)
RDW: 15 % (ref 11.5–15.5)
WBC: 8.8 K/uL (ref 4.0–10.5)
nRBC: 0 % (ref 0.0–0.2)

## 2024-05-18 LAB — HEMOGLOBIN AND HEMATOCRIT, BLOOD
HCT: 20.2 % — ABNORMAL LOW (ref 36.0–46.0)
HCT: 21.9 % — ABNORMAL LOW (ref 36.0–46.0)
HCT: 25.3 % — ABNORMAL LOW (ref 36.0–46.0)
Hemoglobin: 6.6 g/dL — CL (ref 12.0–15.0)
Hemoglobin: 7.3 g/dL — ABNORMAL LOW (ref 12.0–15.0)
Hemoglobin: 8.5 g/dL — ABNORMAL LOW (ref 12.0–15.0)

## 2024-05-18 LAB — MAGNESIUM: Magnesium: 1.7 mg/dL (ref 1.7–2.4)

## 2024-05-18 LAB — PHOSPHORUS: Phosphorus: 3.4 mg/dL (ref 2.5–4.6)

## 2024-05-18 LAB — GLUCOSE, CAPILLARY
Glucose-Capillary: 147 mg/dL — ABNORMAL HIGH (ref 70–99)
Glucose-Capillary: 218 mg/dL — ABNORMAL HIGH (ref 70–99)
Glucose-Capillary: 172 mg/dL — ABNORMAL HIGH (ref 70–99)
Glucose-Capillary: 131 mg/dL — ABNORMAL HIGH (ref 70–99)

## 2024-05-18 LAB — PREPARE RBC (CROSSMATCH)

## 2024-05-18 MED ORDER — SODIUM CHLORIDE 0.9% IV SOLUTION: Freq: Once | INTRAVENOUS | Status: DC

## 2024-05-18 MED ORDER — SODIUM CHLORIDE 0.9% IV SOLUTION: Freq: Once | INTRAVENOUS | Status: AC

## 2024-05-18 NOTE — Anesthesia Postprocedure Evaluation (Signed)
"   Anesthesia Post Note  Patient: Casey Johnston  Procedure(s) Performed: COLONOSCOPY CONTROL OF HEMORRHAGE, GI TRACT, ENDOSCOPIC, BY CLIPPING OR OVERSEWING     Patient location during evaluation: Endoscopy Anesthesia Type: MAC Level of consciousness: awake Pain management: pain level controlled Vital Signs Assessment: post-procedure vital signs reviewed and stable Respiratory status: spontaneous breathing Cardiovascular status: blood pressure returned to baseline Postop Assessment: no apparent nausea or vomiting Anesthetic complications: no   No notable events documented.                Lauraine DASEN Colhoun      "

## 2024-05-18 NOTE — Progress Notes (Signed)
 "  Trauma/Critical Care Follow Up Note  Subjective:    Overnight Issues: Noticed some blood in stool overnight  Objective:  Vital signs for last 24 hours: Temp:  [98.2 F (36.8 C)-99.1 F (37.3 C)] 99.1 F (37.3 C) (12/29 0800) Pulse Rate:  [61-73] 69 (12/29 0630) Resp:  [11-15] 12 (12/29 0630) BP: (103-141)/(33-57) 134/45 (12/29 0630) SpO2:  [93 %-100 %] 98 % (12/29 0630)  Intake/Output from previous day: 12/28 0701 - 12/29 0700 In: 854.2 [I.V.:644.2; Blood:210] Out: -   Intake/Output this shift: No intake/output data recorded.   Physical Exam:  Gen: comfortable, no distress Neuro: follows commands, alert, communicative HEENT: PERRL Neck: supple CV: RRR Pulm: unlabored breathing on RA Abd: soft, NT, nD GU: urine clear and yellow, +spontaneous voids Extr: wwp, no edema  Results for orders placed or performed during the hospital encounter of 05/06/24 (from the past 24 hours)  Glucose, capillary     Status: Abnormal   Collection Time: 05/17/24 11:30 AM  Result Value Ref Range   Glucose-Capillary 116 (H) 70 - 99 mg/dL  Hemoglobin and hematocrit, blood     Status: Abnormal   Collection Time: 05/17/24 12:23 PM  Result Value Ref Range   Hemoglobin 8.3 (L) 12.0 - 15.0 g/dL   HCT 75.2 (L) 63.9 - 53.9 %  Glucose, capillary     Status: Abnormal   Collection Time: 05/17/24  5:19 PM  Result Value Ref Range   Glucose-Capillary 162 (H) 70 - 99 mg/dL  Hemoglobin and hematocrit, blood     Status: Abnormal   Collection Time: 05/17/24  6:34 PM  Result Value Ref Range   Hemoglobin 7.9 (L) 12.0 - 15.0 g/dL   HCT 75.4 (L) 63.9 - 53.9 %  VITAMIN D  25 Hydroxy (Vit-D Deficiency, Fractures)     Status: Abnormal   Collection Time: 05/17/24  6:34 PM  Result Value Ref Range   Vit D, 25-Hydroxy <6.0 (L) 30 - 100 ng/mL  Glucose, capillary     Status: Abnormal   Collection Time: 05/17/24  9:20 PM  Result Value Ref Range   Glucose-Capillary 247 (H) 70 - 99 mg/dL   Comment 1 Notify RN    Hemoglobin and hematocrit, blood     Status: Abnormal   Collection Time: 05/18/24  2:34 AM  Result Value Ref Range   Hemoglobin 6.6 (LL) 12.0 - 15.0 g/dL   HCT 79.7 (L) 63.9 - 53.9 %  Prepare RBC (crossmatch)     Status: None   Collection Time: 05/18/24  4:20 AM  Result Value Ref Range   Order Confirmation      ORDER PROCESSED BY BLOOD BANK Performed at [facility], [address], [place], [place] [address]   Glucose, capillary     Status: Abnormal   Collection Time: 05/18/24  7:51 AM  Result Value Ref Range   Glucose-Capillary 147 (H) 70 - 99 mg/dL  Basic metabolic panel with GFR     Status: Abnormal   Collection Time: 05/18/24  8:06 AM  Result Value Ref Range   Sodium 140 135 - 145 mmol/L   Potassium 4.9 3.5 - 5.1 mmol/L   Chloride 109 98 - 111 mmol/L   CO2 27 22 - 32 mmol/L   Glucose, Bld 155 (H) 70 - 99 mg/dL   BUN 11 8 - 23 mg/dL   Creatinine, Ser 9.31 0.44 - 1.00 mg/dL   Calcium  7.9 (L) 8.9 - 10.3 mg/dL   GFR, Estimated >39 >39 mL/min  Anion gap 4 (L) 5 - 15  Magnesium      Status: None   Collection Time: 05/18/24  8:06 AM  Result Value Ref Range   Magnesium  1.7 1.7 - 2.4 mg/dL  Phosphorus     Status: None   Collection Time: 05/18/24  8:06 AM  Result Value Ref Range   Phosphorus 3.4 2.5 - 4.6 mg/dL  CBC     Status: Abnormal   Collection Time: 05/18/24  8:06 AM  Result Value Ref Range   WBC 8.8 4.0 - 10.5 K/uL   RBC 2.60 (L) 3.87 - 5.11 MIL/uL   Hemoglobin 7.9 (L) 12.0 - 15.0 g/dL   HCT 76.1 (L) 63.9 - 53.9 %   MCV 91.5 80.0 - 100.0 fL   MCH 30.4 26.0 - 34.0 pg   MCHC 33.2 30.0 - 36.0 g/dL   RDW 84.9 88.4 - 84.4 %   Platelets 163 150 - 400 K/uL   nRBC 0.0 0.0 - 0.2 %    Assessment & Plan:  LOS: 12 days   Additional comments:I reviewed the patient's new clinical lab test results.   and I reviewed the patients new imaging test results.    Lower GI bleed, s/p right colon IR embolization and colonoscopy 05/12/2024 -1 cm ulcer at hepatic flexure  status post 2 hemoclips. Repeat CTA 12/25 AM with persistent bleed and underwent repeat AE. Hgb 9.8 this am but has continued to need intermittent transfusion. Per Dr. Jennefer, potential benefit of more proximal embo. Discussed, and have also discussed with the patient, the potential risk of ongoing bleeding or resultant colonic ischemia necessitating surgical intervention, and she is agreeable if this becomes necessary.    FEN - NPO for IR VTE - hold for bleeding issues   Disposition - Would prefer to avoid surgery if possible. Possibly go for AE today. Consider iron+vitC to aid in erythropoiesis. Limit blood draws.   Deward JINNY Foy, MD  Trauma & General Surgery Please use [email or web address] to contact on call provider  05/18/2024  *Care during the described time interval was provided by me. I have reviewed this patient's available data, including medical history, events of note, physical examination and test results as part of my evaluation.    "

## 2024-05-18 NOTE — Progress Notes (Signed)
 Date and time results received: 05/18/2024 0308 (use smartphrase .now to insert current time)  Test: Hemoglobin Critical Value: 6.6  Name of Provider Notified: Dr. Blondie, Via page  Orders Received? Or Actions Taken?:  Awaiting

## 2024-05-18 NOTE — Progress Notes (Signed)
 [facility] Progress Note  Patient: Casey Johnston    FMW:[id number]  DOA: 05/06/2024     Date of Service: the patient was seen and examined on 05/18/2024  Chief Complaint  Patient presents with   Hematochezia   Brief hospital course:  Medical records reviewed and are as summarized below:   Casey Johnston is a 63 y.o. female  with a past medical history significant for type 2 diabetes, HTN, HLD, OSA on no longer utilizing CPAP, GERD and anxiety who presented to the ED at [facility] 12/17 for complaints of shortness of breath and hematochezia.  Of note patient has history of gastric and small bowel AVMs with active GI bleed in the past, recent colonoscopy with diverticular disease.  Patient was admitted per hospitalist.   Patient seen with active bleed overnight 12/19 with resultant hypotension and tachycardia resulting in transfer to ICU for close monitoring     Significant Hospital Events: Including procedures, antibiotic start and stop dates in addition to other pertinent events   12/17 presented to [name] ED with hematochezia and shortness of breath 12/18 CT angio with active contrast extravasation involving the hepatic flexure of the colon consistent with active GI bleed > interventional radiology consulted.  Unfortunately patient remains at [facility] and was unable to undergo IR evaluation.  Patient arrived to [phone] at 1913 12/19 ongoing GI bleed overnight with hypotension and tachycardia.  Underwent embolization of right colic branches 12/20  Softer blood pressure today morning.  Had an episode of medium sized bowel movement with bright red blood.  Hemoglobin down to 6.4.  Given 2 units PRBC.  Surgery and GI consulted.  Repeat CTA done 12/23 colonoscopy. Ulcer found, clips placed. 1 PRBC for hgb dip likely reflectie of old blood seen during scope  12/24 HDS hgb stable txf to progressive 12/25 Developed bleed again overnight, the platelet this is less than third  time of the trauma she has not had any more bowel movements 5.9. CTA with continued bleed at hepatic flexure and got repeat embolization by IR.    Assessment and Plan:  # Acute recurrent lower GI bleeding: S/p IR embolization x 2 (12/19 and 04/2024).  S/p colonoscopy with clip hepatic flexure ulcer on 05/12/2024, right colon angiodysplasia. Follow-up with interventional radiologist and general surgeon. 12/29 IR planning for embolization tomorrow a.m. Keep n.p.o. after midnight   # Acute blood loss anemia: Patient was transfusion related to PRBCs on 05/16/2024.  Hemoglobin up from 5.6-6.7-7.6.   S/p transfusion with multiple units of PRBCs. Iron profile, B12 and folate within normal range 12/28 Hb  6.6 > 1 unit PRBC> 9.8>8.3 12/29 Hb 6.6> 1 unit PRBC> Hb 7.9>7.3> 1 unit of PRBC   Type II DM with hyperglycemia: Continue Lantus .  NovoLog  as needed for hyperglycemia.     Hyponatremia: Improved     Leukocytosis: Improved   Comorbidities include anxiety, class I obesity   Hypocalcemia could be due to multiple PRBC transfusions Vitamin D  deficiency: Vitamin D  level <6.0, started vitamin D  50,000 units p.o. weekly, follow with PCP to repeat vitamin D  level after 3 to 6 months.    Body mass index is 32.45 kg/m.  Nutrition Problem: Inadequate oral intake Etiology: acute illness Interventions: Interventions: Refer to RD note for recommendations   Diet: CLD, NPO after midnight DVT Prophylaxis: SCDs  Advance goals of care discussion: Full code  Family Communication: family was not present at bedside, at the time of interview.  The pt provided permission  to discuss medical plan with the family. Opportunity was given to ask question and all questions were answered satisfactorily.   Disposition:  Pt is from Home, admitted with acute GI bleeding, still has risk of bleeding, which precludes a safe discharge. Discharge to home, when stable, may need 1-2 more days to  improve.  Subjective: No significant events overnight.  Patient saw having small amount of bleeding during BM.  Patient agreed with another unit of blood transfusion, patient already received 1 unit in the morning.   Physical Exam: General: NAD, lying comfortably Appear in no distress, affect appropriate Eyes: PERRLA ENT: Oral Mucosa Clear, moist  Neck: no JVD,  Cardiovascular: S1 and S2 Present, no Murmur,  Respiratory: good respiratory effort, Bilateral Air entry equal and Decreased, no Crackles, no wheezes Abdomen: Bowel Sound present, Soft and no tenderness,  Skin: no rashes Extremities: no Pedal edema, no calf tenderness Neurologic: without any new focal findings Gait not checked due to patient safety concerns  Vitals:   05/18/24 1427 05/18/24 1500 05/18/24 1530 05/18/24 1600  BP: (!) (P) 136/57 (!) 145/57  (!) 158/59  Pulse: 78 69 68 72  Resp: (!) 22 (!) 21 12 20   Temp:   99.2 F (37.3 C)   TempSrc:   Oral   SpO2: 100% 100% 95% 100%  Weight:      Height:        Intake/Output Summary (Last 24 hours) at 05/18/2024 1739 Last data filed at 05/18/2024 1530 Gross per 24 hour  Intake 1357.16 ml  Output --  Net 1357.16 ml   Filed Weights   05/06/24 1132 05/12/24 0935  Weight: 88.5 kg 88.5 kg    Data Reviewed: I have personally reviewed and interpreted daily labs, tele strips, imagings as discussed above. I reviewed all nursing notes, pharmacy notes, vitals, pertinent old records I have discussed plan of care as described above with RN and patient/family.  CBC: Recent Labs  Lab 05/13/24 0813 05/13/24 0934 05/16/24 0408 05/16/24 1001 05/16/24 2058 05/17/24 0046 05/17/24 1223 05/17/24 1834 05/18/24 0234 05/18/24 0806 05/18/24 1147  WBC 13.3*  --  10.9*  --  11.6*  --   --   --   --  8.8  --   HGB 7.1*   < > 6.7*   < > 8.0*   < > 8.3* 7.9* 6.6* 7.9* 7.3*  HCT 20.8*   < > 19.3*   < > 23.4*   < > 24.7* 24.5* 20.2* 23.8* 21.9*  MCV 87.8  --  85.8  --  88.6  --    --   --   --  91.5  --   PLT 145*  --  148*  --  168  --   --   --   --  163  --    < > = values in this interval not displayed.   Basic Metabolic Panel: Recent Labs  Lab 05/13/24 0813 05/14/24 0128 05/16/24 0408 05/18/24 0806  NA 133*  --  136 140  K 4.1  --  4.2 4.9  CL 103  --  108 109  CO2 26  --  24 27  GLUCOSE 184*  --  218* 155*  BUN 8  --  14 11  CREATININE 0.68  --  0.67 0.68  CALCIUM  7.3*  --  7.3* 7.9*  MG 1.8 1.7  --  1.7  PHOS 2.7 2.7  --  3.4    Studies: No results found.  Scheduled  Meds:  sodium chloride    Intravenous Once   sodium chloride    Intravenous Once   atorvastatin   20 mg Oral Daily   Chlorhexidine  Gluconate Cloth  6 each Topical Daily   feeding supplement  1 Container Oral TID BM   insulin  aspart  0-15 Units Subcutaneous TID WC   insulin  aspart  0-5 Units Subcutaneous QHS   insulin  glargine  10 Units Subcutaneous Daily   multivitamin with minerals  1 tablet Oral Daily   pantoprazole   40 mg Oral BID   sodium chloride  flush  3 mL Intravenous Q12H   thiamine   100 mg Oral Daily   Vitamin D  (Ergocalciferol )  50,000 Units Oral Q Mon   Continuous Infusions:   PRN Meds: acetaminophen  **OR** acetaminophen , ALPRAZolam , diphenhydrAMINE , hydrALAZINE , HYDROcodone -acetaminophen , ondansetron  **OR** ondansetron  (ZOFRAN ) IV, prochlorperazine , sodium chloride  flush, traZODone   Time spent: 55 minutes  Author: ELVAN SOR. MD [facility] 05/18/2024 5:39 PM  To reach On-call, see care teams to locate the attending and reach out to them via [email or web address]. If 7PM-7AM, please contact night-coverage If you still have difficulty reaching the attending provider, please page the [facility] (Director on Call) for [facility] on amion for assistance.

## 2024-05-19 DIAGNOSIS — K922 Gastrointestinal hemorrhage, unspecified: Secondary | ICD-10-CM | POA: Diagnosis not present

## 2024-05-19 DIAGNOSIS — K921 Melena: Secondary | ICD-10-CM

## 2024-05-19 LAB — BASIC METABOLIC PANEL WITH GFR
Anion gap: 5 (ref 5–15)
BUN: 12 mg/dL (ref 8–23)
CO2: 26 mmol/L (ref 22–32)
Calcium: 7.8 mg/dL — ABNORMAL LOW (ref 8.9–10.3)
Chloride: 111 mmol/L (ref 98–111)
Creatinine, Ser: 0.64 mg/dL (ref 0.44–1.00)
GFR, Estimated: >60 mL/min
Glucose, Bld: 125 mg/dL — ABNORMAL HIGH (ref 70–99)
Potassium: 4.3 mmol/L (ref 3.5–5.1)
Sodium: 142 mmol/L (ref 135–145)

## 2024-05-19 LAB — TYPE AND SCREEN
ABO/RH(D): O POS
Antibody Screen: NEGATIVE
Unit division: 0
Unit division: 0
Unit division: 0
Unit division: 0
Unit division: 0

## 2024-05-19 LAB — BPAM RBC
Blood Product Expiration Date: 202601242359
Blood Product Expiration Date: 202601242359
Blood Product Expiration Date: 202601242359
Blood Product Expiration Date: 202601252359
Blood Product Expiration Date: 202601272359
ISSUE DATE / TIME: 202512270005
ISSUE DATE / TIME: 202512270633
ISSUE DATE / TIME: 202512280323
ISSUE DATE / TIME: 202512290442
ISSUE DATE / TIME: 202512291358
Unit Type and Rh: 202601252359
Unit Type and Rh: 5100
Unit Type and Rh: 5100
Unit Type and Rh: 5100
Unit Type and Rh: 5100
Unit Type and Rh: 5100

## 2024-05-19 LAB — PHOSPHORUS: Phosphorus: 3.7 mg/dL (ref 2.5–4.6)

## 2024-05-19 LAB — GLUCOSE, CAPILLARY
Glucose-Capillary: 114 mg/dL — ABNORMAL HIGH (ref 70–99)
Glucose-Capillary: 126 mg/dL — ABNORMAL HIGH (ref 70–99)
Glucose-Capillary: 158 mg/dL — ABNORMAL HIGH (ref 70–99)
Glucose-Capillary: 159 mg/dL — ABNORMAL HIGH (ref 70–99)
Glucose-Capillary: 160 mg/dL — ABNORMAL HIGH (ref 70–99)

## 2024-05-19 LAB — HEMOGLOBIN AND HEMATOCRIT, BLOOD
HCT: 20.4 % — ABNORMAL LOW (ref 36.0–46.0)
HCT: 21.2 % — ABNORMAL LOW (ref 36.0–46.0)
HCT: 29 % — ABNORMAL LOW (ref 36.0–46.0)
Hemoglobin: 6.8 g/dL — CL (ref 12.0–15.0)
Hemoglobin: 7 g/dL — ABNORMAL LOW (ref 12.0–15.0)
Hemoglobin: 9.9 g/dL — ABNORMAL LOW (ref 12.0–15.0)

## 2024-05-19 LAB — PREPARE RBC (CROSSMATCH)

## 2024-05-19 LAB — MAGNESIUM: Magnesium: 1.7 mg/dL (ref 1.7–2.4)

## 2024-05-19 MED ORDER — SODIUM CHLORIDE 0.9% IV SOLUTION: Freq: Once | INTRAVENOUS | Status: AC

## 2024-05-19 MED ORDER — NA SULFATE-K SULFATE-MG SULF 17.5-3.13-1.6 GM/177ML PO SOLN
0.5000 | Freq: Once | ORAL | Status: AC
  Administered 2024-05-19: 177 mL via ORAL
  Filled 2024-05-19: qty 1

## 2024-05-19 MED ORDER — NA SULFATE-K SULFATE-MG SULF 17.5-3.13-1.6 GM/177ML PO SOLN
0.5000 | Freq: Once | ORAL | Status: AC
  Administered 2024-05-19: 177 mL via ORAL

## 2024-05-19 MED ORDER — SODIUM CHLORIDE 0.9 % IV SOLN: INTRAVENOUS | Status: AC

## 2024-05-19 MED ORDER — SIMETHICONE 80 MG PO CHEW
240.0000 mg | CHEWABLE_TABLET | Freq: Once | ORAL | Status: AC
  Administered 2024-05-19: 240 mg via ORAL

## 2024-05-19 MED ORDER — SIMETHICONE 80 MG PO CHEW
240.0000 mg | CHEWABLE_TABLET | Freq: Once | ORAL | Status: AC
  Administered 2024-05-19: 240 mg via ORAL
  Filled 2024-05-19: qty 3

## 2024-05-19 NOTE — Progress Notes (Signed)
 [facility] Progress Note  Patient: Casey Johnston    FMW:[id number]  DOA: 05/06/2024     Date of Service: the patient was seen and examined on 05/19/2024  Chief Complaint  Patient presents with   Hematochezia   Brief hospital course:  Medical records reviewed and are as summarized below:   Casey Johnston is a 63 y.o. female  with a past medical history significant for type 2 diabetes, HTN, HLD, OSA on no longer utilizing CPAP, GERD and anxiety who presented to the ED at [facility] 12/17 for complaints of shortness of breath and hematochezia.  Of note patient has history of gastric and small bowel AVMs with active GI bleed in the past, recent colonoscopy with diverticular disease.  Patient was admitted per hospitalist.   Patient seen with active bleed overnight 12/19 with resultant hypotension and tachycardia resulting in transfer to ICU for close monitoring     Significant Hospital Events: Including procedures, antibiotic start and stop dates in addition to other pertinent events   12/17 presented to [name] ED with hematochezia and shortness of breath 12/18 CT angio with active contrast extravasation involving the hepatic flexure of the colon consistent with active GI bleed > interventional radiology consulted.  Unfortunately patient remains at [facility] and was unable to undergo IR evaluation.  Patient arrived to [phone] at 1913 12/19 ongoing GI bleed overnight with hypotension and tachycardia.  Underwent embolization of right colic branches 12/20  Softer blood pressure today morning.  Had an episode of medium sized bowel movement with bright red blood.  Hemoglobin down to 6.4.  Given 2 units PRBC.  Surgery and GI consulted.  Repeat CTA done 12/23 colonoscopy. Ulcer found, clips placed. 1 PRBC for hgb dip likely reflectie of old blood seen during scope  12/24 HDS hgb stable txf to progressive 12/25 Developed bleed again overnight, the platelet this is less than third  time of the trauma she has not had any more bowel movements 5.9. CTA with continued bleed at hepatic flexure and got repeat embolization by IR.    Assessment and Plan:  # Acute recurrent lower GI bleeding: S/p IR embolization x 2 (12/19 and 04/2024).  S/p colonoscopy with clip hepatic flexure ulcer on 05/12/2024, right colon angiodysplasia. Follow-up with interventional radiologist and general surgeon. 12/30 d/w IR and Gen Sx, rec colonoscopy  12/30 d/w GI, agreed for colonoscopy, may need EGD as well.  Started clear liquid diet, GI prep and keep n.p.o. after midnight for scope tomorrow a.m.    # Acute blood loss anemia: Patient was transfusion related to PRBCs on 05/16/2024.  Hemoglobin up from 5.6-6.7-7.6.   S/p transfusion with multiple units of PRBCs. Iron profile, B12 and folate within normal range 12/28 Hb  6.6 > 1 unit PRBC> 9.8>8.3 12/29 Hb 6.6> 1 unit PRBC> Hb 7.9>7.3> 1 unit of PRBC 12/30 Hb 6.8 >2 U prbc >   Type II DM with hyperglycemia: Continue Lantus .  NovoLog  as needed for hyperglycemia.     Hyponatremia: Resolved     Leukocytosis: Resolved   Anxiety, continue as needed meds   Hypocalcemia could be due to multiple PRBC transfusions Vitamin D  deficiency: Vitamin D  level <6.0, started vitamin D  50,000 units p.o. weekly, follow with PCP to repeat vitamin D  level after 3 to 6 months.   Obesity class I Body mass index is 32.45 kg/m.  Nutrition Problem: Inadequate oral intake Etiology: acute illness Interventions: Interventions: Refer to RD note for recommendations   Diet:  CLD, NPO after midnight DVT Prophylaxis: SCDs  Advance goals of care discussion: Full code  Family Communication: family was not present at bedside, at the time of interview.  The pt provided permission to discuss medical plan with the family. Opportunity was given to ask question and all questions were answered satisfactorily.   Disposition:  Pt is from Home, admitted with acute GI  bleeding, still has risk of bleeding, which precludes a safe discharge. Discharge to home, when stable, may need 1-2 more days to improve.  Subjective: No significant events overnight.  Patient still having bleeding while moving bowels.  Denied any abdominal pain, no nausea vomiting. Patient agreed for colonoscopy which will be done tomorrow a.m.     Physical Exam: General: NAD, lying comfortably Appear in no distress, affect appropriate Eyes: PERRLA ENT: Oral Mucosa Clear, moist  Neck: no JVD,  Cardiovascular: S1 and S2 Present, no Murmur,  Respiratory: good respiratory effort, Bilateral Air entry equal and Decreased, no Crackles, no wheezes Abdomen: Bowel Sound present, Soft and no tenderness,  Skin: no rashes Extremities: no Pedal edema, no calf tenderness Neurologic: without any new focal findings Gait not checked due to patient safety concerns  Vitals:   05/19/24 1200 05/19/24 1300 05/19/24 1326 05/19/24 1355  BP:  (!) 134/107 (!) 133/46 (!) 124/38  Pulse:  73 76 64  Resp:  (!) 3 17 13   Temp: 97.6 F (36.4 C)  98.4 F (36.9 C) 98.5 F (36.9 C)  TempSrc: Axillary  Oral Oral  SpO2:  97% 97% 99%  Weight:      Height:        Intake/Output Summary (Last 24 hours) at 05/19/2024 1614 Last data filed at 05/19/2024 1612 Gross per 24 hour  Intake 1243.17 ml  Output --  Net 1243.17 ml   Filed Weights   05/06/24 1132 05/12/24 0935  Weight: 88.5 kg 88.5 kg    Data Reviewed: I have personally reviewed and interpreted daily labs, tele strips, imagings as discussed above. I reviewed all nursing notes, pharmacy notes, vitals, pertinent old records I have discussed plan of care as described above with RN and patient/family.  CBC: Recent Labs  Lab 05/13/24 0813 05/13/24 0934 05/16/24 0408 05/16/24 1001 05/16/24 2058 05/17/24 0046 05/18/24 0806 05/18/24 1147 05/18/24 1756 05/18/24 2355 05/19/24 0528  WBC 13.3*  --  10.9*  --  11.6*  --  8.8  --   --   --   --    HGB 7.1*   < > 6.7*   < > 8.0*   < > 7.9* 7.3* 8.5* 7.0* 6.8*  HCT 20.8*   < > 19.3*   < > 23.4*   < > 23.8* 21.9* 25.3* 21.2* 20.4*  MCV 87.8  --  85.8  --  88.6  --  91.5  --   --   --   --   PLT 145*  --  148*  --  168  --  163  --   --   --   --    < > = values in this interval not displayed.   Basic Metabolic Panel: Recent Labs  Lab 05/13/24 0813 05/14/24 0128 05/16/24 0408 05/18/24 0806 05/19/24 0528  NA 133*  --  136 140 142  K 4.1  --  4.2 4.9 4.3  CL 103  --  108 109 111  CO2 26  --  24 27 26   GLUCOSE 184*  --  218* 155* 125*  BUN 8  --  14 11 12   CREATININE 0.68  --  0.67 0.68 0.64  CALCIUM  7.3*  --  7.3* 7.9* 7.8*  MG 1.8 1.7  --  1.7 1.7  PHOS 2.7 2.7  --  3.4 3.7    Studies: No results found.  Scheduled Meds:  sodium chloride    Intravenous Once   sodium chloride    Intravenous Once   atorvastatin   20 mg Oral Daily   Chlorhexidine  Gluconate Cloth  6 each Topical Daily   feeding supplement  1 Container Oral TID BM   insulin  aspart  0-15 Units Subcutaneous TID WC   insulin  aspart  0-5 Units Subcutaneous QHS   insulin  glargine  10 Units Subcutaneous Daily   multivitamin with minerals  1 tablet Oral Daily   Na Sulfate-K Sulfate-Mg Sulfate concentrate  0.5 kit Oral Once   Followed by   Na Sulfate-K Sulfate-Mg Sulfate concentrate  0.5 kit Oral Once   pantoprazole   40 mg Oral BID   simethicone   240 mg Oral Once   Followed by   simethicone   240 mg Oral Once   sodium chloride  flush  3 mL Intravenous Q12H   Vitamin D  (Ergocalciferol )  50,000 Units Oral Q Mon   Continuous Infusions:  sodium chloride       PRN Meds: acetaminophen  **OR** acetaminophen , ALPRAZolam , diphenhydrAMINE , hydrALAZINE , HYDROcodone -acetaminophen , ondansetron  **OR** ondansetron  (ZOFRAN ) IV, prochlorperazine , sodium chloride  flush, traZODone   Time spent: 55 minutes  Author: ELVAN SOR. MD [facility] 05/19/2024 4:14 PM  To reach On-call, see care teams to locate the attending  and reach out to them via [email or web address]. If 7PM-7AM, please contact night-coverage If you still have difficulty reaching the attending provider, please page the [facility] (Director on Call) for [facility] on amion for assistance.

## 2024-05-19 NOTE — Progress Notes (Addendum)
 Patient ID: Casey Johnston, female   DOB: 04/11/1961, 63 y.o.   MRN: [id number]    Progress Note   Subjective  Day #13 CC; persistent GI bleeding We were asked to reevaluate today, regarding potential repeat colonoscopy.  Patient had been seen by the GI service earlier this admission and had undergone colonoscopy 05/12/2024 after she had been transferred here from [facility].  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  She continued to have issues with bleeding and repeat CTA suggestive of bleeding from the hepatic flexure. At colonoscopy found to have red blood in the entire colon, extensive lavage done-terminal ileum normal, no blood in the TI, few diverticuli in the sigmoid and descending colon.  There was a single 10 mm ulcer found at the hepatic flexure no active bleeding noted at that time but treated with 2 clips for hemostasis.  Unfortunately she rebled again and required repeat CTA on 05/14/2024 again showed active bleeding in the hepatic flexure just below the embolization coils of SMA branches to the hepatic flexure She underwent repeat angiogram, and successful peripheral microcoil embolization of a right vasa recta branch off the SMA at the site of the right hepatic flexure where there was mild oozing noted on CTA. Since that time she has continued to pass intermittent dark red blood, and has required additional units of blood. She has now had a total of 24 units of packed RBCs since onset of this bleed Hemoglobin this a.m. down to 6.8 from 8.5 yesterday after transfusion.  She is currently receiving 1 unit of packed RBCs. She only had 1 bowel movement early this morning dark red blood mixed with stool.  She denies any abdominal pain, no nausea or vomiting, only complaint is fatigue.   Objective   Vital signs in last 24 hours: Temp:  [98.2 F (36.8 C)-99.2 F (37.3 C)] 98.7 F (37.1 C) (12/30  1058) Pulse Rate:  [62-84] 72 (12/30 1100) Resp:  [8-25] 22 (12/30 1100) BP: (94-166)/(37-70) 142/57 (12/30 1100) SpO2:  [82 %-100 %] 99 % (12/30 1100) Last BM Date : 05/19/24 General:    Older African-American female in NAD ,pale Heart:  Regular rate and rhythm; no murmurs Lungs: Respirations even and unlabored, lungs CTA bilaterally Abdomen:  Soft, nontender , nondistended, bowel sounds are present, no palpable mass or hepatosplenomegaly Extremities:  Without edema. Neurologic:  Alert and oriented,  grossly normal neurologically. Psych:  Cooperative. Normal mood and affect.  Intake/Output from previous day: 12/29 0701 - 12/30 0700 In: 623 [P.O.:120; I.V.:3; Blood:500] Out: -  Intake/Output this shift: No intake/output data recorded.  Lab Results: Recent Labs    05/16/24 2058 05/17/24 0046 05/18/24 0806 05/18/24 1147 05/18/24 1756 05/18/24 2355 05/19/24 0528  WBC 11.6*  --  8.8  --   --   --   --   HGB 8.0*   < > 7.9*   < > 8.5* 7.0* 6.8*  HCT 23.4*   < > 23.8*   < > 25.3* 21.2* 20.4*  PLT 168  --  163  --   --   --   --    < > = values in this interval not displayed.   BMET Recent Labs    05/18/24 0806 05/19/24 0528  NA 140 142  K 4.9 4.3  CL 109 111  CO2 27 26  GLUCOSE 155* 125*  BUN 11 12  CREATININE 0.68 0.64  CALCIUM  7.9* 7.8*   LFT No results for input(s): PROT, ALBUMIN, AST, ALT, ALKPHOS, BILITOT, BILIDIR, IBILI in the last 72 hours. PT/INR No results for input(s): LABPROT, INR in the last 72 hours.     Assessment / Plan:    #42 63 year old African-American female with prolonged course over the past 2 weeks with persistent intermittent GI bleeding requiring multiple units of packed RBCs.  As of today is on her 24th unit of packed RBCs.  Bleeding has been isolated to the hepatic flexure where she is felt to have an ulcer from previous embolization done on 05/08/2024 for positive CTA.  At that time she had microcoil embolization  of 3 right vasa recta branches off the SMA supplying an area of the right colon and hepatic flexure angiodysplasia with mild arterial bleeding.  Colonoscopy/05/12/2024 with 2 hemoclips placed to the same ulcer site with no active bleeding seen at the time of the procedure though had red blood throughout the colon.  Repeat bleed on 05/14/2024 requiring repeat CTA and repeat embolization at which time she had 2 additional branches coiled.  Over the past few days she has continued to bleed intermittently and has required further units of packed RBCs.  She has been hemodynamically stable  #2 anemia acute secondary to acute major GI bleed #3 history of hypertension #4 diabetes mellitus  #5.  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 Continue clear liquids today, n.p.o. past midnight Discussed repeat colonoscopy and bowel prep with the patient.  She is agreeable to proceed.  If colonoscopy is not definitive we will have her consented for EGD as well.  Will give 1 additional unit of packed RBCs today  Ultimately surgery is likely to be required with right hemicolectomy.    Principal Problem:   Lower GI bleed Active Problems:   Morbid obesity (HCC)   ABLA (acute blood loss anemia)   Anemia   Angiectasia of gastrointestinal tract   Upper GI bleed   Iron deficiency anemia due to chronic blood loss   Hyperkalemia   AKI (acute kidney injury)   Hypotension due to hypovolemia   Gastrointestinal hemorrhage     LOS: 13 days   Amy EsterwoodPA-C  05/19/2024, 12:01 PM  I have taken an interval history, thoroughly reviewed the chart and examined the patient. I agree with the Advanced Practitioner's note, impression and recommendations, and have recorded additional findings, impressions and recommendations below. I performed a substantive portion of  this encounter (>50% time spent), including a complete performance of the medical decision making.  My additional thoughts are as follows:  Complex situation with recurrent lower GI bleeding that thus far has required 24 units of PRBCs during hospitalizations at [facility] and then [facility]. Multiple prior imaging study and endoscopy reports reviewed.  Recurrent bleeding is almost certainly at the same site with perhaps a new post angiography ulcer from the second IR procedure that was done after Dr. Andy December 23 colonoscopy. If so, it is not clear if it will be amenable to any endoscopic therapy.  Dr. Suzann had good  initial control and clip closure of the post embolization hepatic flexure ulcer, but those clips are small and do not always hold well.  We understand the very difficult situation that both this patient and her care team are in and will help as best we can.  I will do a colonoscopy on  her tomorrow since she is willing to undergo that and do the bowel preparation this evening.  Visualization may be limited as before if there is ongoing bleeding, but at least fortunately we know the most likely location of this bleeding site.  If I am able to locate it, I will do what I can with any available endoscopic therapy, but I agree that she is very likely to need a right hemicolectomy.  Procedure was described in detail along with risks and benefits and Lyle was agreeable. Results will be conveyed to her and her care team afterward.   The benefits and risks of the planned procedure(s) were described in detail with the patient or (when appropriate) their health care proxy.  Risks were outlined as including, but not limited to, bleeding, infection, perforation, adverse medication reaction leading to cardiac or pulmonary decompensation, pancreatitis (if ERCP).  The limitation of incomplete mucosal visualization was also discussed.  No guarantees or warranties were  given.  Patient at high risk for complications of procedure due to medical comorbidities as well as the active bleeding..  2 units PRBCs completed today, then she will have close serial hematocrit checks with more transfusion if needed. _________________  This consultation required a high degree of medical decision making due to the nature and complexity of the acute condition(s) being evaluated as well as the patient's medical comorbidities.  Victory LITTIE Brand III Office:[phone]

## 2024-05-19 NOTE — Progress Notes (Addendum)
 "  Trauma/Critical Care Follow Up Note  Subjective:    Overnight Issues: 1 BM in last 24 hrs (this morning) per patient. Dark red admixed with stool. Denies any abdominal pain or distention  Objective:  Vital signs for last 24 hours: Temp:  [98.2 F (36.8 C)-99.2 F (37.3 C)] 98.4 F (36.9 C) (12/30 0800) Pulse Rate:  [62-84] 64 (12/30 0700) Resp:  [10-25] 12 (12/30 0700) BP: (94-166)/(37-108) 100/37 (12/30 0700) SpO2:  [82 %-100 %] 94 % (12/30 0700)  Intake/Output from previous day: 12/29 0701 - 12/30 0700 In: 623 [P.O.:120; I.V.:3; Blood:500] Out: -   Intake/Output this shift: No intake/output data recorded.   Physical Exam:  Gen: comfortable, no distress Neuro: follows commands, alert, communicative HEENT: PERRL Neck: supple CV: RRR Pulm: unlabored breathing on RA Abd: soft, NT, nD GU: urine clear and yellow, +spontaneous voids Extr: wwp, no edema  Results for orders placed or performed during the hospital encounter of 05/06/24 (from the past 24 hours)  Hemoglobin and hematocrit, blood     Status: Abnormal   Collection Time: 05/18/24 11:47 AM  Result Value Ref Range   Hemoglobin 7.3 (L) 12.0 - 15.0 g/dL   HCT 78.0 (L) 63.9 - 53.9 %  Glucose, capillary     Status: Abnormal   Collection Time: 05/18/24 11:54 AM  Result Value Ref Range   Glucose-Capillary 218 (H) 70 - 99 mg/dL  Prepare RBC (crossmatch)     Status: None   Collection Time: 05/18/24  2:00 PM  Result Value Ref Range   Order Confirmation      ORDER PROCESSED BY BLOOD BANK Performed at [facility], [address] [place], [place] [address]   Glucose, capillary     Status: Abnormal   Collection Time: 05/18/24  4:22 PM  Result Value Ref Range   Glucose-Capillary 172 (H) 70 - 99 mg/dL  Hemoglobin and hematocrit, blood     Status: Abnormal   Collection Time: 05/18/24  5:56 PM  Result Value Ref Range   Hemoglobin 8.5 (L) 12.0 - 15.0 g/dL   HCT 74.6 (L) 63.9 - 53.9 %  Glucose, capillary      Status: Abnormal   Collection Time: 05/18/24  9:18 PM  Result Value Ref Range   Glucose-Capillary 131 (H) 70 - 99 mg/dL  Hemoglobin and hematocrit, blood     Status: Abnormal   Collection Time: 05/18/24 11:55 PM  Result Value Ref Range   Hemoglobin 7.0 (L) 12.0 - 15.0 g/dL   HCT 78.7 (L) 63.9 - 53.9 %  Basic metabolic panel with GFR     Status: Abnormal   Collection Time: 05/19/24  5:28 AM  Result Value Ref Range   Sodium 142 135 - 145 mmol/L   Potassium 4.3 3.5 - 5.1 mmol/L   Chloride 111 98 - 111 mmol/L   CO2 26 22 - 32 mmol/L   Glucose, Bld 125 (H) 70 - 99 mg/dL   BUN 12 8 - 23 mg/dL   Creatinine, Ser 9.35 0.44 - 1.00 mg/dL   Calcium  7.8 (L) 8.9 - 10.3 mg/dL   GFR, Estimated >39 >39 mL/min   Anion gap 5 5 - 15  Magnesium      Status: None   Collection Time: 05/19/24  5:28 AM  Result Value Ref Range   Magnesium  1.7 1.7 - 2.4 mg/dL  Phosphorus     Status: None   Collection Time: 05/19/24  5:28 AM  Result Value Ref Range   Phosphorus 3.7  2.5 - 4.6 mg/dL  Hemoglobin and hematocrit, blood     Status: Abnormal   Collection Time: 05/19/24  5:28 AM  Result Value Ref Range   Hemoglobin 6.8 (LL) 12.0 - 15.0 g/dL   HCT 79.5 (L) 63.9 - 53.9 %  Glucose, capillary     Status: Abnormal   Collection Time: 05/19/24  7:45 AM  Result Value Ref Range   Glucose-Capillary 114 (H) 70 - 99 mg/dL  Prepare RBC (crossmatch)     Status: None   Collection Time: 05/19/24  8:56 AM  Result Value Ref Range   Order Confirmation      ORDER PROCESSED BY BLOOD BANK Performed at [facility], [address] [place], [place] [address]     Assessment & Plan:  LOS: 13 days   Additional comments:I reviewed the patient's new clinical lab test results.   and I reviewed the patients new imaging test results.    LGIB, s/p right colon IR embolization and colonoscopy 05/12/2024 - 1 cm ulcer at hepatic flexure status post 2 hemoclips. Repeat CTA 12/25 AM with persistent bleed and underwent repeat AE.  Per Dr. Jennefer, potential benefit of more proximal embo. Discussed, and have also discussed with the patient, the potential risk of ongoing bleeding or resultant colonic ischemia necessitating surgical intervention, and she is agreeable if this becomes necessary.   - Hgb 6.8 <--7.0 (yesterday PM) <-- 8.5. Appears to have somewhat stabilized, only 1 BM since yesterday AM. Noted plans for PRBC transfusion given hgb 6.8, agree. Continue to monitor hgb   FEN - NPO at present VTE - hold for bleeding issues   Disposition - Patient would prefer to avoid surgery if at all possible per discussions we had with her today. Consider iron+vitC to aid in erythropoiesis.  I spent a total of 35 minutes today in both face-to-face and non-face-to-face activities to perform the following: review records, take and update history, examine the patient, counsel the patient on the diagnosis, and document encounter, findings, and plan in the EHR  for this visit on the date of this encounter.    Lonni CHRISTELLA Pizza, MD  Trauma & General Surgery Please use [email or web address] to contact on call provider  05/19/2024  *Care during the described time interval was provided by me. I have reviewed this patient's available data, including medical history, events of note, physical examination and test results as part of my evaluation.    "

## 2024-05-19 NOTE — H&P (View-Only) (Signed)
 Patient ID: Casey Johnston, female   DOB: 04/11/1961, 63 y.o.   MRN: [id number]    Progress Note   Subjective  Day #13 CC; persistent GI bleeding We were asked to reevaluate today, regarding potential repeat colonoscopy.  Patient had been seen by the GI service earlier this admission and had undergone colonoscopy 05/12/2024 after she had been transferred here from [facility].  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  She continued to have issues with bleeding and repeat CTA suggestive of bleeding from the hepatic flexure. At colonoscopy found to have red blood in the entire colon, extensive lavage done-terminal ileum normal, no blood in the TI, few diverticuli in the sigmoid and descending colon.  There was a single 10 mm ulcer found at the hepatic flexure no active bleeding noted at that time but treated with 2 clips for hemostasis.  Unfortunately she rebled again and required repeat CTA on 05/14/2024 again showed active bleeding in the hepatic flexure just below the embolization coils of SMA branches to the hepatic flexure She underwent repeat angiogram, and successful peripheral microcoil embolization of a right vasa recta branch off the SMA at the site of the right hepatic flexure where there was mild oozing noted on CTA. Since that time she has continued to pass intermittent dark red blood, and has required additional units of blood. She has now had a total of 24 units of packed RBCs since onset of this bleed Hemoglobin this a.m. down to 6.8 from 8.5 yesterday after transfusion.  She is currently receiving 1 unit of packed RBCs. She only had 1 bowel movement early this morning dark red blood mixed with stool.  She denies any abdominal pain, no nausea or vomiting, only complaint is fatigue.   Objective   Vital signs in last 24 hours: Temp:  [98.2 F (36.8 C)-99.2 F (37.3 C)] 98.7 F (37.1 C) (12/30  1058) Pulse Rate:  [62-84] 72 (12/30 1100) Resp:  [8-25] 22 (12/30 1100) BP: (94-166)/(37-70) 142/57 (12/30 1100) SpO2:  [82 %-100 %] 99 % (12/30 1100) Last BM Date : 05/19/24 General:    Older African-American female in NAD ,pale Heart:  Regular rate and rhythm; no murmurs Lungs: Respirations even and unlabored, lungs CTA bilaterally Abdomen:  Soft, nontender , nondistended, bowel sounds are present, no palpable mass or hepatosplenomegaly Extremities:  Without edema. Neurologic:  Alert and oriented,  grossly normal neurologically. Psych:  Cooperative. Normal mood and affect.  Intake/Output from previous day: 12/29 0701 - 12/30 0700 In: 623 [P.O.:120; I.V.:3; Blood:500] Out: -  Intake/Output this shift: No intake/output data recorded.  Lab Results: Recent Labs    05/16/24 2058 05/17/24 0046 05/18/24 0806 05/18/24 1147 05/18/24 1756 05/18/24 2355 05/19/24 0528  WBC 11.6*  --  8.8  --   --   --   --   HGB 8.0*   < > 7.9*   < > 8.5* 7.0* 6.8*  HCT 23.4*   < > 23.8*   < > 25.3* 21.2* 20.4*  PLT 168  --  163  --   --   --   --    < > = values in this interval not displayed.   BMET Recent Labs    05/18/24 0806 05/19/24 0528  NA 140 142  K 4.9 4.3  CL 109 111  CO2 27 26  GLUCOSE 155* 125*  BUN 11 12  CREATININE 0.68 0.64  CALCIUM  7.9* 7.8*   LFT No results for input(s): PROT, ALBUMIN, AST, ALT, ALKPHOS, BILITOT, BILIDIR, IBILI in the last 72 hours. PT/INR No results for input(s): LABPROT, INR in the last 72 hours.     Assessment / Plan:    #42 63 year old African-American female with prolonged course over the past 2 weeks with persistent intermittent GI bleeding requiring multiple units of packed RBCs.  As of today is on her 24th unit of packed RBCs.  Bleeding has been isolated to the hepatic flexure where she is felt to have an ulcer from previous embolization done on 05/08/2024 for positive CTA.  At that time she had microcoil embolization  of 3 right vasa recta branches off the SMA supplying an area of the right colon and hepatic flexure angiodysplasia with mild arterial bleeding.  Colonoscopy/05/12/2024 with 2 hemoclips placed to the same ulcer site with no active bleeding seen at the time of the procedure though had red blood throughout the colon.  Repeat bleed on 05/14/2024 requiring repeat CTA and repeat embolization at which time she had 2 additional branches coiled.  Over the past few days she has continued to bleed intermittently and has required further units of packed RBCs.  She has been hemodynamically stable  #2 anemia acute secondary to acute major GI bleed #3 history of hypertension #4 diabetes mellitus  #5.  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 Continue clear liquids today, n.p.o. past midnight Discussed repeat colonoscopy and bowel prep with the patient.  She is agreeable to proceed.  If colonoscopy is not definitive we will have her consented for EGD as well.  Will give 1 additional unit of packed RBCs today  Ultimately surgery is likely to be required with right hemicolectomy.    Principal Problem:   Lower GI bleed Active Problems:   Morbid obesity (HCC)   ABLA (acute blood loss anemia)   Anemia   Angiectasia of gastrointestinal tract   Upper GI bleed   Iron deficiency anemia due to chronic blood loss   Hyperkalemia   AKI (acute kidney injury)   Hypotension due to hypovolemia   Gastrointestinal hemorrhage     LOS: 13 days   Amy EsterwoodPA-C  05/19/2024, 12:01 PM  I have taken an interval history, thoroughly reviewed the chart and examined the patient. I agree with the Advanced Practitioner's note, impression and recommendations, and have recorded additional findings, impressions and recommendations below. I performed a substantive portion of  this encounter (>50% time spent), including a complete performance of the medical decision making.  My additional thoughts are as follows:  Complex situation with recurrent lower GI bleeding that thus far has required 24 units of PRBCs during hospitalizations at [facility] and then [facility]. Multiple prior imaging study and endoscopy reports reviewed.  Recurrent bleeding is almost certainly at the same site with perhaps a new post angiography ulcer from the second IR procedure that was done after Dr. Andy December 23 colonoscopy. If so, it is not clear if it will be amenable to any endoscopic therapy.  Dr. Suzann had good  initial control and clip closure of the post embolization hepatic flexure ulcer, but those clips are small and do not always hold well.  We understand the very difficult situation that both this patient and her care team are in and will help as best we can.  I will do a colonoscopy on  her tomorrow since she is willing to undergo that and do the bowel preparation this evening.  Visualization may be limited as before if there is ongoing bleeding, but at least fortunately we know the most likely location of this bleeding site.  If I am able to locate it, I will do what I can with any available endoscopic therapy, but I agree that she is very likely to need a right hemicolectomy.  Procedure was described in detail along with risks and benefits and Lyle was agreeable. Results will be conveyed to her and her care team afterward.   The benefits and risks of the planned procedure(s) were described in detail with the patient or (when appropriate) their health care proxy.  Risks were outlined as including, but not limited to, bleeding, infection, perforation, adverse medication reaction leading to cardiac or pulmonary decompensation, pancreatitis (if ERCP).  The limitation of incomplete mucosal visualization was also discussed.  No guarantees or warranties were  given.  Patient at high risk for complications of procedure due to medical comorbidities as well as the active bleeding..  2 units PRBCs completed today, then she will have close serial hematocrit checks with more transfusion if needed. _________________  This consultation required a high degree of medical decision making due to the nature and complexity of the acute condition(s) being evaluated as well as the patient's medical comorbidities.  Victory LITTIE Brand III Office:[phone]

## 2024-05-19 NOTE — Progress Notes (Signed)
 "   Referring Physician(s): Dr. Dreama Hanger  Supervising Physician: Jennefer Rover  Patient Status:  [facility] - In-pt  Chief Complaint: GI bleed  Subjective: Patient with small episode of BRBPR yesterday as well as additional combination dark and bright bloody bowel movement today with another drop in Hgb to 6.6.  Has had an additional 3u PRBC (one currently infusing).  IR asked to come eval for 3rd embolization.   Allergies: Januvia  [sitagliptin ] and Amlodipine   Medications: Prior to Admission medications  Medication Sig Start Date End Date Taking? Authorizing Provider  atorvastatin  (LIPITOR) 20 MG tablet TAKE 1 Tablet BY MOUTH ONCE EVERY DAY Patient taking differently: Take 20 mg by mouth daily. TAKE 1 Tablet BY MOUTH ONCE EVERY DAY 10/05/21  Yes Comer Kirsch, PA-C  cloNIDine  (CATAPRES ) 0.1 MG tablet Take 1 tablet (0.1 mg total) by mouth 2 (two) times daily. Patient taking differently: Take 0.1 mg by mouth daily. 10/05/21  Yes Comer Kirsch, PA-C  Dulaglutide (TRULICITY) 0.75 MG/0.5ML SOPN Inject 0.75 mg into the skin every Monday.   Yes [provider]  FEROSUL 325 (65 Fe) MG tablet Take 325 mg by mouth daily. 04/24/24  Yes [provider]  linaclotide  (LINZESS ) 72 MCG capsule Take 72 mcg by mouth daily as needed (constipation).   Yes [provider]  losartan -hydrochlorothiazide  (HYZAAR) 100-12.5 MG tablet Take 1 tablet by mouth daily. 11/09/22  Yes [provider]  metFORMIN  (GLUCOPHAGE ) 1000 MG tablet Take 1 tablet (1,000 mg total) by mouth 2 (two) times daily with a meal. 10/05/21  Yes Comer Kirsch, PA-C  pantoprazole  (PROTONIX ) 40 MG tablet Take 1 tablet (40 mg total) by mouth 2 (two) times daily before a meal. 04/12/24  Yes Sigdel, Santosh, MD  glipiZIDE  (GLUCOTROL  XL) 5 MG 24 hr tablet Take 1 tablet (5 mg total) by mouth daily with breakfast. 10/18/22   Lenis Ethelle ORN, MD     Vital Signs: BP (!) 142/57   Pulse 72   Temp 98.7  F (37.1 C) (Oral)   Resp (!) 22   Ht 5' 5 (1.651 m)   Wt 195 lb (88.5 kg)   SpO2 99%   BMI 32.45 kg/m   Physical Exam NAD, alert, fatigued but oriented.  Abdomen: soft, non-tender. No cramping.    Imaging: No results found.   Labs:  CBC: Recent Labs    05/13/24 0813 05/13/24 0934 05/16/24 0408 05/16/24 1001 05/16/24 2058 05/17/24 0046 05/18/24 0806 05/18/24 1147 05/18/24 1756 05/18/24 2355 05/19/24 0528  WBC 13.3*  --  10.9*  --  11.6*  --  8.8  --   --   --   --   HGB 7.1*   < > 6.7*   < > 8.0*   < > 7.9* 7.3* 8.5* 7.0* 6.8*  HCT 20.8*   < > 19.3*   < > 23.4*   < > 23.8* 21.9* 25.3* 21.2* 20.4*  PLT 145*  --  148*  --  168  --  163  --   --   --   --    < > = values in this interval not displayed.    COAGS: Recent Labs    04/04/24 0621 05/11/24 0731  INR 1.0 1.0  APTT  --  24    BMP: Recent Labs    05/13/24 0813 05/16/24 0408 05/18/24 0806 05/19/24 0528  NA 133* 136 140 142  K 4.1 4.2 4.9 4.3  CL 103 108 109 111  CO2 26 24 27  26  GLUCOSE 184* 218* 155* 125*  BUN 8 14 11 12   CALCIUM  7.3* 7.3* 7.9* 7.8*  CREATININE 0.68 0.67 0.68 0.64  GFRNONAA >60 >60 >60 >60    LIVER FUNCTION TESTS: Recent Labs    04/04/24 0621 05/06/24 1147 05/10/24 1830  BILITOT 0.2 0.3 0.3  AST 22 24 22   ALT 13 13 7   ALKPHOS 68 81 41  PROT 6.7 7.2 3.7*  ALBUMIN 3.8 3.9 2.1*    Assessment and Plan: Recurrent lower GI bleed s/p embolization x2 (12/19, 12/25) Patient with ongoing evidence of GI bleed despite embolization x2. She was last assessed by IR on 12/28 with Hgb as high as 10.8 and no active, persistent bleeding.  At that time embolization was deferred.  Last episode of BRB was this morning and again her hemoglobin as dropped necessitating transfusion. IR available for additional embolization but conscientous of the fact that this is would be her third attempt to gain control in addition to clips with evidence of possible embolization ulcer/injury on EGD  from 12/23.   Patient tells me she is open to surgery if colectomy is ultimately needed, but is agreement with team to try all minimally invasion options as we can.   Dr. Philip has discussed with GI.  Recommends repeat scope to determine whether patient is in fact bleeding from the same area.  GI planning to scope tomorrow after prep today.   IR following.    Electronically Signed: Glendine Swetz Sue-Ellen Carolyn Maniscalco, PA 05/19/2024, 11:49 AM   I spent a total of 15 Minutes at the the patient's bedside AND on the patient's hospital floor or unit, greater than 50% of which was counseling/coordinating care for GI bleed.      "

## 2024-05-19 NOTE — Plan of Care (Signed)
" °  Problem: Clinical Measurements: Goal: Diagnostic test results will improve Outcome: Progressing   Problem: Nutrition: Goal: Adequate nutrition will be maintained Outcome: Progressing   Problem: Coping: Goal: Level of anxiety will decrease Outcome: Progressing   Problem: Elimination: Goal: Will not experience complications related to bowel motility Outcome: Progressing   Problem: Education: Goal: Ability to describe self-care measures that may prevent or decrease complications (Diabetes Survival Skills Education) will improve Outcome: Progressing   "

## 2024-05-20 ENCOUNTER — Inpatient Hospital Stay (HOSPITAL_COMMUNITY): Admitting: Anesthesiology

## 2024-05-20 ENCOUNTER — Encounter (HOSPITAL_COMMUNITY): Payer: Self-pay | Admitting: Internal Medicine

## 2024-05-20 ENCOUNTER — Encounter (HOSPITAL_COMMUNITY): Admission: EM | Disposition: A | Payer: Self-pay | Source: Home / Self Care | Attending: Pulmonary Disease

## 2024-05-20 DIAGNOSIS — D500 Iron deficiency anemia secondary to blood loss (chronic): Secondary | ICD-10-CM | POA: Diagnosis not present

## 2024-05-20 DIAGNOSIS — F418 Other specified anxiety disorders: Secondary | ICD-10-CM

## 2024-05-20 DIAGNOSIS — K5730 Diverticulosis of large intestine without perforation or abscess without bleeding: Secondary | ICD-10-CM

## 2024-05-20 DIAGNOSIS — D62 Acute posthemorrhagic anemia: Secondary | ICD-10-CM | POA: Diagnosis not present

## 2024-05-20 DIAGNOSIS — K921 Melena: Secondary | ICD-10-CM

## 2024-05-20 DIAGNOSIS — G4730 Sleep apnea, unspecified: Secondary | ICD-10-CM

## 2024-05-20 DIAGNOSIS — K922 Gastrointestinal hemorrhage, unspecified: Secondary | ICD-10-CM | POA: Diagnosis not present

## 2024-05-20 DIAGNOSIS — I10 Essential (primary) hypertension: Secondary | ICD-10-CM

## 2024-05-20 DIAGNOSIS — D649 Anemia, unspecified: Secondary | ICD-10-CM | POA: Diagnosis not present

## 2024-05-20 HISTORY — PX: COLONOSCOPY: SHX5424

## 2024-05-20 HISTORY — PX: LAPAROSCOPIC RIGHT COLECTOMY: SHX5925

## 2024-05-20 HISTORY — PX: ESOPHAGOGASTRODUODENOSCOPY: SHX5428

## 2024-05-20 LAB — GLUCOSE, CAPILLARY
Glucose-Capillary: 96 mg/dL (ref 70–99)
Glucose-Capillary: 110 mg/dL — ABNORMAL HIGH (ref 70–99)
Glucose-Capillary: 172 mg/dL — ABNORMAL HIGH (ref 70–99)
Glucose-Capillary: 237 mg/dL — ABNORMAL HIGH (ref 70–99)

## 2024-05-20 LAB — HEMOGLOBIN AND HEMATOCRIT, BLOOD
HCT: 22.8 % — ABNORMAL LOW (ref 36.0–46.0)
HCT: 21.9 % — ABNORMAL LOW (ref 36.0–46.0)
HCT: 28.7 % — ABNORMAL LOW (ref 36.0–46.0)
HCT: 24.4 % — ABNORMAL LOW (ref 36.0–46.0)
Hemoglobin: 7.7 g/dL — ABNORMAL LOW (ref 12.0–15.0)
Hemoglobin: 7.4 g/dL — ABNORMAL LOW (ref 12.0–15.0)
Hemoglobin: 9.7 g/dL — ABNORMAL LOW (ref 12.0–15.0)
Hemoglobin: 8.1 g/dL — ABNORMAL LOW (ref 12.0–15.0)

## 2024-05-20 LAB — BASIC METABOLIC PANEL WITH GFR
Anion gap: 6 (ref 5–15)
BUN: 11 mg/dL (ref 8–23)
CO2: 27 mmol/L (ref 22–32)
Calcium: 7.6 mg/dL — ABNORMAL LOW (ref 8.9–10.3)
Chloride: 111 mmol/L (ref 98–111)
Creatinine, Ser: 0.68 mg/dL (ref 0.44–1.00)
GFR, Estimated: >60 mL/min
Glucose, Bld: 99 mg/dL (ref 70–99)
Potassium: 3.9 mmol/L (ref 3.5–5.1)
Sodium: 143 mmol/L (ref 135–145)

## 2024-05-20 LAB — MAGNESIUM: Magnesium: 1.8 mg/dL (ref 1.7–2.4)

## 2024-05-20 LAB — PREPARE RBC (CROSSMATCH)

## 2024-05-20 MED ORDER — DEXAMETHASONE SOD PHOSPHATE PF 10 MG/ML IJ SOLN
INTRAMUSCULAR | Status: DC | PRN
  Administered 2024-05-20: 10 mg via INTRAVENOUS

## 2024-05-20 MED ORDER — PROPOFOL 10 MG/ML IV BOLUS
INTRAVENOUS | Status: DC | PRN
  Administered 2024-05-20: 100 mg via INTRAVENOUS
  Administered 2024-05-20: 100 ug/kg/min via INTRAVENOUS

## 2024-05-20 MED ORDER — ACETAMINOPHEN 325 MG PO TABS
650.0000 mg | ORAL_TABLET | Freq: Four times a day (QID) | ORAL | Status: DC
  Administered 2024-05-20 – 2024-05-23 (×9): 650 mg via ORAL
  Filled 2024-05-20 (×10): qty 2

## 2024-05-20 MED ORDER — INSULIN ASPART 100 UNIT/ML IJ SOLN: 0.0000 [IU] | INTRAMUSCULAR | Status: DC | PRN

## 2024-05-20 MED ORDER — FENTANYL CITRATE (PF) 100 MCG/2ML IJ SOLN
INTRAMUSCULAR | Status: AC
  Filled 2024-05-20: qty 2

## 2024-05-20 MED ORDER — OXYCODONE HCL 5 MG PO TABS
10.0000 mg | ORAL_TABLET | ORAL | Status: DC | PRN
  Administered 2024-05-20 – 2024-05-23 (×8): 10 mg via ORAL
  Filled 2024-05-20 (×9): qty 2

## 2024-05-20 MED ORDER — DOCUSATE SODIUM 100 MG PO CAPS
100.0000 mg | ORAL_CAPSULE | Freq: Two times a day (BID) | ORAL | Status: DC
  Administered 2024-05-20 – 2024-05-24 (×8): 100 mg via ORAL
  Filled 2024-05-20 (×8): qty 1

## 2024-05-20 MED ORDER — HYDROMORPHONE HCL 1 MG/ML IJ SOLN
INTRAMUSCULAR | Status: DC | PRN
  Administered 2024-05-20: .5 mg via INTRAVENOUS

## 2024-05-20 MED ORDER — FENTANYL CITRATE (PF) 250 MCG/5ML IJ SOLN
INTRAMUSCULAR | Status: DC | PRN
  Administered 2024-05-20: 100 ug via INTRAVENOUS
  Administered 2024-05-20 (×2): 50 ug via INTRAVENOUS

## 2024-05-20 MED ORDER — LIDOCAINE 2% (20 MG/ML) 5 ML SYRINGE
INTRAMUSCULAR | Status: DC | PRN
  Administered 2024-05-20: 40 mg via INTRAVENOUS

## 2024-05-20 MED ORDER — ALBUMIN HUMAN 5 % IV SOLN
25.0000 g | Freq: Once | INTRAVENOUS | Status: AC
  Administered 2024-05-20: 25 g via INTRAVENOUS
  Filled 2024-05-20: qty 500

## 2024-05-20 MED ORDER — LACTATED RINGERS IV SOLN: INTRAVENOUS | Status: DC

## 2024-05-20 MED ORDER — SODIUM CHLORIDE 0.9 % IV SOLN
1.0000 g | Freq: Two times a day (BID) | INTRAVENOUS | Status: DC
  Administered 2024-05-20 – 2024-05-23 (×6): 1 g via INTRAVENOUS
  Filled 2024-05-20 (×11): qty 1

## 2024-05-20 MED ORDER — PHENYLEPHRINE HCL-NACL 20-0.9 MG/250ML-% IV SOLN
INTRAVENOUS | Status: DC | PRN
  Administered 2024-05-20: 25 ug/min via INTRAVENOUS

## 2024-05-20 MED ORDER — HYDROMORPHONE HCL 1 MG/ML IJ SOLN
INTRAMUSCULAR | Status: AC
  Filled 2024-05-20: qty 0.5

## 2024-05-20 MED ORDER — ONDANSETRON HCL 4 MG/2ML IJ SOLN
INTRAMUSCULAR | Status: AC
  Filled 2024-05-20: qty 2

## 2024-05-20 MED ORDER — OXYCODONE HCL 5 MG PO TABS
5.0000 mg | ORAL_TABLET | ORAL | Status: DC | PRN
  Administered 2024-05-22: 5 mg via ORAL
  Filled 2024-05-20: qty 1

## 2024-05-20 MED ORDER — CHLORHEXIDINE GLUCONATE 0.12 % MT SOLN: 15.0000 mL | Freq: Once | OROMUCOSAL | Status: AC

## 2024-05-20 MED ORDER — SUGAMMADEX SODIUM 200 MG/2ML IV SOLN
INTRAVENOUS | Status: AC
  Filled 2024-05-20: qty 2

## 2024-05-20 MED ORDER — SODIUM CHLORIDE 0.9% IV SOLUTION: Freq: Once | INTRAVENOUS | Status: DC

## 2024-05-20 MED ORDER — PROPOFOL 10 MG/ML IV BOLUS
INTRAVENOUS | Status: DC | PRN
  Administered 2024-05-20: 140 mg via INTRAVENOUS

## 2024-05-20 MED ORDER — CHLORHEXIDINE GLUCONATE 0.12 % MT SOLN
OROMUCOSAL | Status: AC
  Administered 2024-05-20: 15 mL via OROMUCOSAL
  Filled 2024-05-20: qty 15

## 2024-05-20 MED ORDER — OXYCODONE HCL 5 MG PO TABS: 5.0000 mg | ORAL_TABLET | Freq: Once | ORAL | Status: DC | PRN

## 2024-05-20 MED ORDER — PROPOFOL 10 MG/ML IV BOLUS
INTRAVENOUS | Status: AC
  Filled 2024-05-20: qty 20

## 2024-05-20 MED ORDER — LIDOCAINE 2% (20 MG/ML) 5 ML SYRINGE
INTRAMUSCULAR | Status: AC
  Filled 2024-05-20: qty 5

## 2024-05-20 MED ORDER — ONDANSETRON HCL 4 MG/2ML IJ SOLN: 4.0000 mg | Freq: Once | INTRAMUSCULAR | Status: DC | PRN

## 2024-05-20 MED ORDER — 0.9 % SODIUM CHLORIDE (POUR BTL) OPTIME
TOPICAL | Status: DC | PRN
  Administered 2024-05-20: 2000 mL

## 2024-05-20 MED ORDER — ROCURONIUM BROMIDE 10 MG/ML (PF) SYRINGE
PREFILLED_SYRINGE | INTRAVENOUS | Status: DC | PRN
  Administered 2024-05-20: 10 mg via INTRAVENOUS
  Administered 2024-05-20: 60 mg via INTRAVENOUS

## 2024-05-20 MED ORDER — PHENYLEPHRINE 80 MCG/ML (10ML) SYRINGE FOR IV PUSH (FOR BLOOD PRESSURE SUPPORT)
PREFILLED_SYRINGE | INTRAVENOUS | Status: DC | PRN
  Administered 2024-05-20: 120 ug via INTRAVENOUS
  Administered 2024-05-20: 160 ug via INTRAVENOUS

## 2024-05-20 MED ORDER — ROCURONIUM BROMIDE 10 MG/ML (PF) SYRINGE
PREFILLED_SYRINGE | INTRAVENOUS | Status: AC
  Filled 2024-05-20: qty 10

## 2024-05-20 MED ORDER — PROPOFOL 10 MG/ML IV BOLUS: INTRAVENOUS | Status: DC | PRN

## 2024-05-20 MED ORDER — BUPIVACAINE-EPINEPHRINE (PF) 0.25% -1:200000 IJ SOLN
INTRAMUSCULAR | Status: AC
  Filled 2024-05-20: qty 30

## 2024-05-20 MED ORDER — SUGAMMADEX SODIUM 200 MG/2ML IV SOLN
INTRAVENOUS | Status: DC | PRN
  Administered 2024-05-20: 200 mg via INTRAVENOUS

## 2024-05-20 MED ORDER — FENTANYL CITRATE (PF) 100 MCG/2ML IJ SOLN
25.0000 ug | INTRAMUSCULAR | Status: DC | PRN
  Administered 2024-05-20: 50 ug via INTRAVENOUS

## 2024-05-20 MED ORDER — SIMETHICONE 80 MG PO CHEW: 80.0000 mg | CHEWABLE_TABLET | Freq: Four times a day (QID) | ORAL | Status: DC | PRN

## 2024-05-20 MED ORDER — HYDROMORPHONE HCL 1 MG/ML IJ SOLN
0.5000 mg | INTRAMUSCULAR | Status: DC | PRN
  Administered 2024-05-20 – 2024-05-22 (×2): 0.5 mg via INTRAVENOUS
  Filled 2024-05-20 (×2): qty 1

## 2024-05-20 MED ORDER — MEPERIDINE HCL 25 MG/ML IJ SOLN: 6.2500 mg | INTRAMUSCULAR | Status: DC | PRN

## 2024-05-20 MED ORDER — LACTATED RINGERS IV SOLN
INTRAVENOUS | Status: AC | PRN
  Administered 2024-05-20: 1000 mL via INTRAVENOUS

## 2024-05-20 MED ORDER — OXYCODONE HCL 5 MG/5ML PO SOLN: 5.0000 mg | Freq: Once | ORAL | Status: DC | PRN

## 2024-05-20 MED ORDER — ORAL CARE MOUTH RINSE: 15.0000 mL | Freq: Once | OROMUCOSAL | Status: AC

## 2024-05-20 NOTE — Op Note (Signed)
 [facility] Patient Name: Casey Johnston Procedure Date : 05/20/2024 MRN: [id number] Attending MD: Victory CROME. Legrand , MD, [id number] Date of Birth: 14-Jan-1961 CSN: [id number] Age: 63 Admit Type: Inpatient Procedure:                Upper GI endoscopy Indications:              Acute post hemorrhagic anemia, Hematochezia                           ruling out UGI source of ongoing bleeding thus far                            requiring > 25 units of PRBCs                           clinical details in 05/19/24 consult note Providers:                Victory L. Legrand, MD, Almarie Masters, RN, Curtistine Bishop, Technician Referring MD:             [facility] and Dr Lonni Pizza (Surgery) Medicines:                Monitored Anesthesia Care Complications:            No immediate complications. Estimated Blood Loss:     Estimated blood loss: none. Procedure:                Pre-Anesthesia Assessment:                           - Prior to the procedure, a History and Physical                            was performed, and patient medications and                            allergies were reviewed. The patient's tolerance of                            previous anesthesia was also reviewed. The risks                            and benefits of the procedure and the sedation                            options and risks were discussed with the patient.                            All questions were answered, and informed consent                            was obtained. Prior Anticoagulants: The patient has  taken no anticoagulant or antiplatelet agents. ASA                            Grade Assessment: IV - A patient with severe                            systemic disease that is a constant threat to life.                            After reviewing the risks and benefits, the patient                            was deemed in  satisfactory condition to undergo the                            procedure.                           After obtaining informed consent, the endoscope was                            passed under direct vision. Throughout the                            procedure, the patient's blood pressure, pulse, and                            oxygen saturations were monitored continuously. The                            [id number]-[id number] ([id number]) Olympus endoscope was introduced                            through the mouth, and advanced to the second part                            of duodenum. The upper GI endoscopy was                            accomplished without difficulty. The patient                            tolerated the procedure well. Scope In: Scope Out: Findings:      The esophagus was normal.      The stomach was normal.      The cardia and gastric fundus were normal on retroflexion.      The examined duodenum was normal. Impression:               - Normal esophagus.                           - Normal stomach.                           - Normal examined duodenum.                           -  No specimens collected.                           No upper GI source of bleeding Recommendation:           - Return patient to hospital ward for ongoing care.                           - See the other procedure note for documentation of                            additional recommendations. Procedure Code(s):        --- Professional ---                           [phone], Esophagogastroduodenoscopy, flexible,                            transoral; diagnostic, including collection of                            specimen(s) by brushing or washing, when performed                            (separate procedure) Diagnosis Code(s):        --- Professional ---                           D62, Acute posthemorrhagic anemia                           K92.1, Melena (includes Hematochezia) CPT copyright 2022 American  Medical Association. All rights reserved. The codes documented in this report are preliminary and upon coder review may  be revised to meet current compliance requirements. Nehemie Casserly L. Legrand, MD 05/20/2024 11:37:42 AM This report has been signed electronically. Number of Addenda: 0

## 2024-05-20 NOTE — Transfer of Care (Signed)
 Immediate Anesthesia Transfer of Care Note  Patient: Casey Johnston  Procedure(s) Performed: COLECTOMY, RIGHT, LAPAROSCOPIC (Abdomen)  Patient Location: PACU  Anesthesia Type:General  Level of Consciousness: drowsy  Airway & Oxygen Therapy: Patient Spontanous Breathing  Post-op Assessment: Report given to RN  Post vital signs: Reviewed and stable  Last Vitals:  Vitals Value Taken Time  BP 167/61 05/20/24 16:45  Temp 36.6 C 05/20/24 16:40  Pulse 85 05/20/24 16:47  Resp 13 05/20/24 16:47  SpO2 93 % 05/20/24 16:47  Vitals shown include unfiled device data.  Last Pain:  Vitals:   05/20/24 1640  TempSrc:   PainSc: Asleep      Patients Stated Pain Goal: 0 (05/12/24 0935)  Complications: No notable events documented.

## 2024-05-20 NOTE — Interval H&P Note (Signed)
 History and Physical Interval Note:  05/20/2024 10:10 AM  Casey Johnston  has presented today for surgery, with the diagnosis of Persistent GI bleeding.  The various methods of treatment have been discussed with the patient and family. After consideration of risks, benefits and other options for treatment, the patient has consented to  Procedures: COLONOSCOPY (N/A) EGD (ESOPHAGOGASTRODUODENOSCOPY) (N/A) as a surgical intervention.  The patient's history has been reviewed, patient examined, no change in status, stable for surgery.  I have reviewed the patient's chart and labs.  Questions were answered to the patient's satisfaction.    Patient reports no passage of blood per rectum since finishing her bowel prep around midnight. Looks well in the preop area, exam benign, vital signs stable, pulse in the 70s Morning hemoglobin was 7.4, and we requested another unit PRBCs.  Blood bank is still preparing this, so it may be administered here in the endoscopy lab during her procedure or upon return to the medical floor.  Victory LITTIE Brand III

## 2024-05-20 NOTE — Progress Notes (Signed)
 "          [facility]                                                                              [address], is a 63 y.o. female, DOB - 1960-10-26, FMW:[id number] Admit date - 05/06/2024    Outpatient Primary MD for the patient is Teresa Jenkins Jansky, FNP  LOS - 14  days  Chief Complaint  Patient presents with   Hematochezia       Brief summary   Patient is a 63 year old female with DM type II, HTN, HLD, OSA on no longer utilizing CPAP, GERD and anxiety who presented to the ED at [facility] 12/17 for complaints of shortness of breath and hematochezia. Of note patient has history of gastric and small bowel AVMs with active GI bleed in the past, recent colonoscopy with diverticular disease.  Patient was found to have active bleeding with hypotension and tachycardia on 12/19, transferred to ICU for close monitoring. Significant Hospital Events:   12/17 presented to [name] ED with hematochezia and shortness of breath 12/18 CT angio with active contrast extravasation involving the hepatic flexure of the colon consistent with active GI bleed > interventional radiology consulted.  Unfortunately patient remains at [facility] and was unable to undergo IR evaluation.  Patient arrived to [phone] at 1913 12/19 ongoing GI bleed overnight with hypotension and tachycardia.  Underwent embolization of right colic branches 12/20  Softer blood pressure today morning.  Had an episode of medium sized bowel movement with bright red blood.  Hemoglobin down to 6.4.  Given 2 units PRBC.  Surgery and GI consulted.  Repeat CTA done 12/23 colonoscopy. Ulcer found, clips placed. 1 PRBC for hgb dip likely reflectie of old blood seen during scope  12/24 HDS hgb stable txf to progressive 12/25 Developed bleed again overnight, the platelet this is less than third time of the trauma she has not had any more bowel movements 5.9. CTA with continued bleed at hepatic flexure and got repeat embolization by  IR.  Assessment & Plan     Acute recurrent lower GI bleeding:  Anemia/ABLA acute secondary to major GI bleed - S/p IR embolization x 2 (12/19 and 12/25).   - S/p colonoscopy with clip hepatic flexure ulcer on 05/12/2024, right colon angiodysplasia. -Continue to have bleeding and repeat CT on 12/25 is suggestive of bleeding from the hepatic flexure just below the embolization coils of SMA branches to the hepatic flexure. -Underwent repeat angiogram and embolization of right vasa recta branch of the SMA at the site of right hepatic flexure. - She has continued to pass intermittent dark red blood, has received a total of 25 units of packed RBCs since admission (including 1 packed RBCs today) - General Surgery, GI following. - 12/31: Underwent EGD: Normal esophagus, stomach, duodenum.   Colonoscopy with diverticulosis in the transverse colon, in the ascending colon and left colon.  Solitary ulcer at the hepatic flexure blood in the entire examined colon.  Received additional unit of packed RBCs today - General Surgery following, recommending laparoscopic, possibly open right colectomy.   DM type II, uncontrolled with hyperglycemia CBG (last 3)  Recent  Labs    05/19/24 2151 05/20/24 0725 05/20/24 1259  GLUCAP 160* 96 110*  Continue sliding scale insulin , Lantus  10 units daily   Hyponatremia -resolved   Leukocytosis: -  Resolved   Anxiety - continue Xanax  as needed   Mild hypocalcemia -Likely close to normal with hypoalbuminemia, albumin level 2.1 on 12/21  Vitamin D  deficiency:  -Vitamin D  level <6.0, started vitamin D  50,000 units p.o. weekly, follow with PCP to repeat vitamin D  level after 3 to 6 months.   Moderate protein calorie malnutrition, hypoalbuminemia Nutrition Problem: Inadequate oral intake Etiology: acute illness Signs/Symptoms:  (NPO/CLD)  Interventions: Refer to RD note for recommendations  Obesity class I Estimated body mass index is 32.45 kg/m as  calculated from the following:   Height as of this encounter: 5' 5 (1.651 m).   Weight as of this encounter: 88.5 kg.  Code Status: Full code DVT Prophylaxis:  Place and maintain sequential compression device Start: 05/17/24 1741 SCDs Start: 05/06/24 1732   Level of Care: Level of care: Med-Surg Family Communication: Updated patient Disposition Plan:      Remains inpatient appropriate:      Procedures:    Consultants:   Gastroenterology General Surgery IR Critical care  Antimicrobials:   Anti-infectives (From admission, onward)    Start     Dose/Rate Route Frequency Ordered Stop   05/20/24 1230  [MAR Hold]  cefoTEtan (CEFOTAN) 1 g in sodium chloride  0.9 % 100 mL IVPB        (MAR Hold since Wed 05/20/2024 at 1311.Hold Reason: Transfer to a Procedural area)   1 g 200 mL/hr over 30 Minutes Intravenous Every 12 hours 05/20/24 1218            Medications  [MAR Hold] sodium chloride    Intravenous Once   [MAR Hold] sodium chloride    Intravenous Once   [MAR Hold] sodium chloride    Intravenous Once   [MAR Hold] atorvastatin   20 mg Oral Daily   [MAR Hold] Chlorhexidine  Gluconate Cloth  6 each Topical Daily   [MAR Hold] feeding supplement  1 Container Oral TID BM   [MAR Hold] insulin  aspart  0-15 Units Subcutaneous TID WC   [MAR Hold] insulin  aspart  0-5 Units Subcutaneous QHS   [MAR Hold] insulin  glargine  10 Units Subcutaneous Daily   [MAR Hold] multivitamin with minerals  1 tablet Oral Daily   [MAR Hold] pantoprazole   40 mg Oral BID   [MAR Hold] sodium chloride  flush  3 mL Intravenous Q12H   [MAR Hold] Vitamin D  (Ergocalciferol )  50,000 Units Oral Q Mon      Subjective:   Casey Johnston was seen and examined today.  Seen this morning, had colonoscopy prep and bleeding noted.  Patient denies chest pain, shortness of breath, abdominal pain,.  No nausea or vomiting.  Objective:   Vitals:   05/20/24 1150 05/20/24 1200 05/20/24 1210 05/20/24 1220  BP: (!) 131/47  (!) 146/62 (!) 161/66 (!) 167/67  Pulse: 63 67 70 68  Resp: 19 16 20  (!) 21  Temp:      TempSrc:      SpO2: 100% 100% 100% 100%  Weight:      Height:        Intake/Output Summary (Last 24 hours) at 05/20/2024 1325 Last data filed at 05/20/2024 1125 Gross per 24 hour  Intake 896 ml  Output --  Net 896 ml     Wt Readings from Last 3 Encounters:  05/12/24 88.5 kg  04/21/24  88.5 kg  04/05/24 90.2 kg     Exam General: Alert and oriented x 3, NAD Cardiovascular: S1 S2 auscultated,  RRR Respiratory: Clear to auscultation bilaterally, no wheezing, rales or rhonchi Gastrointestinal: Soft, nontender, nondistended, + bowel sounds Ext: no pedal edema bilaterally Neuro: Strength 5/5 upper and lower extremities bilaterally Skin: No rashes Psych: Normal affect     Data Reviewed:  I have personally reviewed following labs    CBC Lab Results  Component Value Date   WBC 8.8 05/18/2024   RBC 2.60 (L) 05/18/2024   HGB 7.4 (L) 05/20/2024   HCT 21.9 (L) 05/20/2024   MCV 91.5 05/18/2024   MCH 30.4 05/18/2024   PLT 163 05/18/2024   MCHC 33.2 05/18/2024   RDW 15.0 05/18/2024   LYMPHSABS 4.1 (H) 05/06/2024   MONOABS 0.6 05/06/2024   EOSABS 0.1 05/06/2024   BASOSABS 0.1 05/06/2024     Last metabolic panel Lab Results  Component Value Date   NA 143 05/20/2024   K 3.9 05/20/2024   CL 111 05/20/2024   CO2 27 05/20/2024   BUN 11 05/20/2024   CREATININE 0.68 05/20/2024   GLUCOSE 99 05/20/2024   GFRNONAA >60 05/20/2024   GFRAA >60 02/09/2020   CALCIUM  7.6 (L) 05/20/2024   PHOS 3.7 05/19/2024   PROT 3.7 (L) 05/10/2024   ALBUMIN 2.1 (L) 05/10/2024   BILITOT 0.3 05/10/2024   ALKPHOS 41 05/10/2024   AST 22 05/10/2024   ALT 7 05/10/2024   ANIONGAP 6 05/20/2024    CBG (last 3)  Recent Labs    05/19/24 2151 05/20/24 0725 05/20/24 1259  GLUCAP 160* 96 110*      Coagulation Profile: No results for input(s): INR, PROTIME in the last 168 hours.   Radiology  Studies: I have personally reviewed the imaging studies  No results found.     Nydia Distance M.D. [facility] 05/20/2024, 1:25 PM  Available via Epic secure chat 7am-7pm After 7 pm, please refer to night coverage provider listed on amion.    "

## 2024-05-20 NOTE — Op Note (Signed)
 [facility] Patient Name: Casey Johnston Procedure Date : 05/20/2024 MRN: [id number] Attending MD: Victory CROME. Legrand , MD, [id number] Date of Birth: Dec 03, 1960 CSN: [id number] Age: 63 Admit Type: Inpatient Procedure:                Colonoscopy Indications:              Hematochezia, Acute post hemorrhagic anemia                           see inpatient consult note from 05/19/24 for details                           ongoing LGIB despite multiple prior angiographic                            procedures and colonoscopies Providers:                Victory L. Legrand, MD, Almarie Masters, RN, Curtistine Bishop, Technician Referring MD:             [facility] and Dr. Lonni Pizza                            (Surgery) Medicines:                Monitored Anesthesia Care Complications:            No immediate complications. Estimated Blood Loss:     Estimated blood loss: none. Procedure:                Pre-Anesthesia Assessment:                           - Prior to the procedure, a History and Physical                            was performed, and patient medications and                            allergies were reviewed. The patient's tolerance of                            previous anesthesia was also reviewed. The risks                            and benefits of the procedure and the sedation                            options and risks were discussed with the patient.                            All questions were answered, and informed consent                            was obtained. Prior Anticoagulants:  The patient has                            taken no anticoagulant or antiplatelet agents. ASA                            Grade Assessment: IV - A patient with severe                            systemic disease that is a constant threat to life.                            After reviewing the risks and benefits, the patient                             was deemed in satisfactory condition to undergo the                            procedure.                           After obtaining informed consent, the colonoscope                            was passed under direct vision. Throughout the                            procedure, the patient's blood pressure, pulse, and                            oxygen saturations were monitored continuously. The                            CF-HQ190L ([id number]) Olympus colonoscope was                            introduced through the anus and advanced to the the                            cecum, identified by appendiceal orifice and                            ileocecal valve. (TI could not be intubated due to                            reundant colon/scope looping) The colonoscopy was                            somewhat difficult due to a redundant colon.                            Successful completion of the procedure was aided by  straightening and shortening the scope to obtain                            bowel loop reduction. The patient tolerated the                            procedure well. The quality of the bowel                            preparation was fair. The ileocecal valve,                            appendiceal orifice, and rectum were photographed. Scope In: 10:53:42 AM Scope Out: 11:20:04 AM Scope Withdrawal Time: 0 hours 22 minutes 1 second  Total Procedure Duration: 0 hours 26 minutes 22 seconds  Findings:      The perianal and digital rectal examinations were normal.      Red and clotted blood was found in the entire colon. Lavaged to the       extent possible, but still limits mucosal visualization. Copious clotted       blood in distal transverse colon that could not be cleared (the       dependent area with patient in left lateral decubitus position).      Multiple small-mouthed diverticula were found in the transverse colon,       ascending colon and  left colon. None of the several that were visualized       had active bleeding or stigmata of bleeding.      A single ulcer was found at the hepatic flexure/proximal transverse       colon. No active bleeding was present. It was the same site seen on the       05/12/24 colonoscopy when two clips were applied to it. The ulcer is       larger and now with multiple serpiginous and superficial areas over       about 15-70mm. The two clips were still present but had become more       distant from each other than their orignal placement (see photos). This       area was observed for a long period with lavage, and no active bleeding       was seen. There was also no stigmata of bleeding from this ulcer. The       tissue was nodular but not friable. There was no target for endoscopic       intervention. A few small diverticuli were also seen near that site.      The exam was otherwise without abnormality. Impression:               - Preparation of the colon was fair.                           - Blood in the entire examined colon.                           - Diverticulosis in the transverse colon, in the                            ascending colon  and in the left colon.                           - A single (solitary) ulcer at the hepatic flexure.                           - The examination was otherwise normal.                           - No specimens collected.                           While the angiographies have suggested a colonic                            AVM as the source, we should consider that it could                            be intermittent diverticular bleeding. Regardless,                            it has failed multiple IR and endoscopic                            interventions. Recommendation:           - Return patient to hospital ward for ongoing care.                           - NPO.                           - Surgical service will be contacted immediately                             with results.                           Operative intervention recommended.                           Check hemoglobin and hematocrit 2 hours after                            completing current unit of PRBCs and transfuse more                            PRBCs if needed.(per primary medical team                            parameters) Procedure Code(s):        --- Professional ---                           [phone], Colonoscopy, flexible; diagnostic, including  collection of specimen(s) by brushing or washing,                            when performed (separate procedure) Diagnosis Code(s):        --- Professional ---                           K92.2, Gastrointestinal hemorrhage, unspecified                           K63.3, Ulcer of intestine                           K92.1, Melena (includes Hematochezia)                           D62, Acute posthemorrhagic anemia                           K57.30, Diverticulosis of large intestine without                            perforation or abscess without bleeding CPT copyright 2022 American Medical Association. All rights reserved. The codes documented in this report are preliminary and upon coder review may  be revised to meet current compliance requirements. Braniyah Besse L. Legrand, MD 05/20/2024 12:01:09 PM This report has been signed electronically. Number of Addenda: 0

## 2024-05-20 NOTE — Anesthesia Procedure Notes (Signed)
 Procedure Name: Intubation Date/Time: 05/20/2024 2:13 PM  Performed by: Delores Duwaine SAUNDERS, CRNAPre-anesthesia Checklist: Patient identified, Emergency Drugs available, Suction available and Patient being monitored Patient Re-evaluated:Patient Re-evaluated prior to induction Oxygen Delivery Method: Circle System Utilized Preoxygenation: Pre-oxygenation with 100% oxygen Induction Type: IV induction Ventilation: Mask ventilation without difficulty Laryngoscope Size: Glidescope and 3 Grade View: Grade I Tube type: Oral Tube size: 7.0 mm Number of attempts: 1 (First look with MAC 3 and grade 3 view. Second look with Glide S3 grade 1 view and easy intubation) Airway Equipment and Method: Stylet and Oral airway Placement Confirmation: ETT inserted through vocal cords under direct vision, positive ETCO2 and breath sounds checked- equal and bilateral Secured at: 22 cm Tube secured with: Tape Dental Injury: Teeth and Oropharynx as per pre-operative assessment

## 2024-05-20 NOTE — Progress Notes (Signed)
 Pt arrived to short stay for surgery. A&Ox4. Verbal consent given. Informed due to recent anesthesia, she cannot sign own consent. Permission granted to call sister, Cy Drivers. Telephone consent with second nurse witness signed and placed in chart.

## 2024-05-20 NOTE — Progress Notes (Incomplete)
" ° ° ° °  Patient Name: Casey Johnston           DOB: May 01, 1961  MRN: [id number]      Admission Date: 05/06/2024  Attending Provider: Davia Nydia POUR, MD  Primary Diagnosis: Lower GI bleed   Level of care: Med-Surg   OVERNIGHT EVENT   Notified by bedside RN of pt being hypotensive after recently receiving oxcdone and dilaudid .   Hypotensive- Current BP 78/44 (55). Prior BP documented has been low, SBP 80-90s. Remains alert and oriented. All other vitals stable, afebrile. No acute changes reported. Very edematous per RN assessment.  She was admited for acute recurrent lower GI bleeding and is s/p embolization by IR 12/19, 12/25. She had a colonoscopy today which showed worsening ulcer, no further angiographic or endoscopic options for treatment -- surgery was recommended. She had a right colectomy on 12/31.  Current Hgb 8.1. She has received a total of 26 units  (1 unit today, 12/31) of pRBCs since admission.  Sedating meds could affect BP, minimize use.    Plan: IV albumin for hypotension RN to monitor BP after albumin has completed. Report hypotension, low urine output, change in symptoms.  Minimize use of sedating meds, narcotics. Hold antihypertensives.    Addendum-  Hgb 6.4, confirmed lab. Order placed for 1 unit prbc.  No bleeding reported tonight by RN. BP stable, currently 113/51 (69).  Continues to have low u/o and poor oral intake. Adding IVF x12 hrs. Monitor output.    Christell Steinmiller, DNP, ACNPC- AG [facility] [place]    "

## 2024-05-20 NOTE — Transfer of Care (Signed)
 Immediate Anesthesia Transfer of Care Note  Patient: Casey Johnston  Procedure(s) Performed: COLONOSCOPY EGD (ESOPHAGOGASTRODUODENOSCOPY)  Patient Location: PACU and Endoscopy Unit  Anesthesia Type:MAC  Level of Consciousness: awake and alert   Airway & Oxygen Therapy: Patient Spontanous Breathing and Patient connected to nasal cannula oxygen  Post-op Assessment: Report given to RN and Post -op Vital signs reviewed and stable  Post vital signs: Reviewed and stable  Last Vitals:  Vitals Value Taken Time  BP 123/46 05/20/24 11:40  Temp 36.1 C 05/20/24 11:40  Pulse 66 05/20/24 11:46  Resp 21 05/20/24 11:46  SpO2 98 % 05/20/24 11:46  Vitals shown include unfiled device data.  Last Pain:  Vitals:   05/20/24 1140  TempSrc: Temporal  PainSc: 0-No pain      Patients Stated Pain Goal: 0 (05/12/24 0935)  Complications: No notable events documented.

## 2024-05-20 NOTE — Anesthesia Postprocedure Evaluation (Signed)
"   Anesthesia Post Note  Patient: Casey Johnston  Procedure(s) Performed: COLONOSCOPY EGD (ESOPHAGOGASTRODUODENOSCOPY)     Patient location during evaluation: PACU Anesthesia Type: MAC Level of consciousness: awake and alert Pain management: pain level controlled Vital Signs Assessment: post-procedure vital signs reviewed and stable Respiratory status: spontaneous breathing, nonlabored ventilation, respiratory function stable and patient connected to nasal cannula oxygen Cardiovascular status: stable and blood pressure returned to baseline Postop Assessment: no apparent nausea or vomiting Anesthetic complications: no   No notable events documented.  Last Vitals:  Vitals:   05/20/24 1210 05/20/24 1220  BP: (!) 161/66 (!) 167/67  Pulse: 70 68  Resp: 20 (!) 21  Temp:    SpO2: 100% 100%    Last Pain:  Vitals:   05/20/24 1220  TempSrc:   PainSc: 0-No pain                 Azeneth Carbonell      "

## 2024-05-20 NOTE — Progress Notes (Signed)
 Ulcer worsening on colonoscopy, no further angiographic or endoscopic options for treatment, surgery recommended.  I recommend laparoscopic, possibly open, right colectomy.  Patient awakening from colonoscopy and I will discuss with patient.  We have previously outlined laparoscopic right colectomy, its risks, benefits and alteratives and patient aware that she may require this if other less invasive procedures fail.  I will attempt to discuss this again with the patient as she awakens from colonoscopy anesthesia.  I called  Daughter, Clarnce Fellows, at [phone] and the call went to voicemail.  I called the patient's sister, Cy Norse at [phone], and we discussed everything above she voiced understanding, said daughter is likely at work without her phone.  They voiced understanding and agreement with the plan.  We will proceed emergently to the operating room due to ongoing bleeding and transfusion requirement.    Deward JINNY Foy, MD General, Bariatric and Minimally Invasive Surgery [facility]

## 2024-05-20 NOTE — Op Note (Signed)
 "  Patient: Casey Johnston ([birth year], [id number])  Date of Surgery: 05/20/2024  Preoperative Diagnosis: Bleeding right colon ulcer   Postoperative Diagnosis: Bleeding right colon ulcer   Surgical Procedure: COLECTOMY, RIGHT, LAPAROSCOPIC: 55794 (CPT)    Operative Team Members:  Surgeons and Role:    * Osten Janek, Deward PARAS, MD - Primary   Anesthesiologist: Dorethea Cordella SQUIBB, DO CRNA: Delores Duwaine SAUNDERS, CRNA   Anesthesia: General   Fluids:  Total I/O In: 315 [Blood:315] Out: 500 [Urine:500]  Complications: * No complications entered in OR log *  Drains:  none   Specimen:  ID Type Source Tests Collected by Time Destination  1 : Right colon Tissue PATH GI Other SURGICAL PATHOLOGY Casey Johnston, Deward PARAS, MD 05/20/2024 1530      Disposition:  PACU - hemodynamically stable.  Plan of Care: Admit to inpatient     Indications for Procedure: Casey Johnston is a 63 y.o. female who presented with a bleeding right colon ulcer that failed to respond to multiple attempts at colonoscopy and angiographic intervention.  I recommended laparoscopic right colectomy.  We discussed the procedure itself as well as its risk, benefits, and alternatives.  The risk discussed include but not limited to the risk of infection, bleeding, damage to structures, anastomotic leak, need for additional surgery procedure.  After full discussion all questions answered the patient granted consent to proceed.  Findings: Right colon ulcer with clips identified in specimen on the back table after right colectomy performed   Description of Procedure:   On the date stated above the patient taken the operating suite and placed in supine position.  General endotracheal anesthesia was induced.  A timeout was completed verifying the correct patient, procedure, position, and equipment needed for the case.  The patient's abdomen is prepped and draped in usual sterile fashion.  I made a midline laparotomy incision  the with the my hand at the umbilicus and inserted a wound protector into this position.  There were some adhesions between the omentum and abdominal wall which were lysed.  The abdomen was inflated to 15 mmHg using the HandPort with a 12 mm trocar through it and 2 additional 5 mm trocars were placed in the right lower quadrant.  The patient was placed in left side down positioning and I began dissecting out the right colon.  I performed a lateral to medial mobilization of the right colon by dividing the white line of Toldt and mobilizing the connections between the right colon and the retroperitoneum using the [place]  LigaSure.  I was careful to avoid injury to the retroperitoneal structures.  I was careful to avoid injury to the duodenum.  I work from the terminal ileum up to the cecum toward the hepatic flexure.  I divided all the hepatocolic ligament attachments in the right upper quadrant and fully mobilized the hepatic flexure of the colon.  I worked to mobilize the omentum off the transverse colon and provide additional mobilization of the transverse colon.  The clips and ulcer were not easily visible intraoperatively so I was aggressive and making sure to resect enough right colon to incorporate the area of bleeding on the CT scan.  Once the right colon was fully mobilized I delivered it through the mini laparotomy incision through the wound protector.  The terminal ileum was divided using a GIA 75 stapler.  The mesentery was divided using the [place]  LigaSure and Vicryl sutures to ligate the ileocolic vessels.  I continued the resection of the mesocolon  using the LigaSure until I reached the transverse colon.  I was confident was past the area of bleeding on the CT scan so I then divided the proximal transverse colon using the GIA 75 stapler.  The specimen was then passed off the field as right colon.  A segment of omentum was passed off the field with the specimen.  The specimen was opened on the back  table and the endoscopic clips were identified and the ulcer was identified so as confident the bleeding ulcer had been resected.  The anastomosis was then created in the standard fashion.  Enterotomies were made in the tinea of the colon and the antimesenteric border of the distal ileum.  A GIA 75 stapler was inserted into both limbs of intestine and fired to create an anastomosis.  A Vicryl suture was placed at the apex of the suture line for reinforcement.  The common enterotomy was closed using multiple interrupted 2-0 Vicryl suture.  The corners of the staple line were imbricated using a 2-0 Vicryl suture.  The omentum was tacked over the anastomosis using a 2-0 Vicryl suture.  The anastomosis was returned to the abdomen.  The mesenteric defect was large and not closed.  The dirty instruments were removed from the field we reprepped redraped regowned and gloved and used clean instruments to close the abdomen.  The midline was closed using 0 PDS for the fascia, the subcutaneous tissues were irrigated and then closed using 2-0 Vicryl and the skin was closed using 4-0 Monocryl and Dermabond.  All sponge needle counts are correct at the end of this case.  At the end of the case we reviewed the infection status of the case. Patient: Casey Johnston [facility] Patient Case: Emergent - bleeding colon Infection Present At Time Of Surgery (PATOS): Infection related to creating a ileocolic anastomosis  Deward Foy, MD General, Bariatric, & Minimally Invasive Surgery [facility]  "

## 2024-05-20 NOTE — Anesthesia Preprocedure Evaluation (Signed)
"                                    Anesthesia Evaluation  Patient identified by MRN, date of birth, ID band Patient awake    Reviewed: Allergy & Precautions, NPO status , Patient's Chart, lab work & pertinent test results, reviewed documented beta blocker date and time   History of Anesthesia Complications Negative for: history of anesthetic complications  Airway Mallampati: II  TM Distance: >3 FB Neck ROM: Full    Dental  (+) Teeth Intact, Dental Advisory Given   Pulmonary sleep apnea and Continuous Positive Airway Pressure Ventilation , Current Smoker   breath sounds clear to auscultation       Cardiovascular hypertension, Pt. 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   Endo/Other  diabetes, Type 2    Renal/GU Renal disease     Musculoskeletal   Abdominal   Peds  Hematology  (+) Blood dyscrasia, anemia Hgb 8.0, Plts 88K (05/12/24)   Anesthesia Other Findings   Reproductive/Obstetrics                              Anesthesia Physical Anesthesia Plan  ASA: 3  Anesthesia Plan: MAC   Post-op Pain Management:    Induction: Intravenous  PONV Risk Score and Plan: 2 and Treatment may vary due to age or medical condition  Airway Management Planned: Natural Airway and Simple Face Mask  Additional Equipment: None  Intra-op Plan:   Post-operative Plan: Extubation in OR  Informed Consent: I have reviewed the patients History and Physical, chart, labs and discussed the procedure including the risks, benefits and alternatives for the proposed anesthesia with the patient or authorized representative who has indicated his/her understanding and acceptance.     Dental advisory given  Plan Discussed with: CRNA and  Anesthesiologist  Anesthesia Plan Comments:          Anesthesia Quick Evaluation  "

## 2024-05-20 NOTE — Anesthesia Preprocedure Evaluation (Addendum)
 "                                  Anesthesia Evaluation  Patient identified by MRN, date of birth, ID band Patient awake    Reviewed: Allergy & Precautions, NPO status , Patient's Chart, lab work & pertinent test results  Airway Mallampati: II  TM Distance: >3 FB Neck ROM: Full    Dental no notable dental hx.    Pulmonary sleep apnea and Continuous Positive Airway Pressure Ventilation , Current Smoker   Pulmonary exam normal        Cardiovascular hypertension, Pt. on medications  Rhythm:Regular Rate:Normal     Neuro/Psych  Headaches  Anxiety Depression       GI/Hepatic Neg liver ROS,GERD  Medicated,,  Endo/Other  diabetes, Type 2, Oral Hypoglycemic Agents    Renal/GU negative Renal ROS  negative genitourinary   Musculoskeletal   Abdominal Normal abdominal exam  (+)   Peds  Hematology  (+) Blood dyscrasia, anemia Lab Results      Component                Value               Date                      WBC                      8.8                 05/18/2024                HGB                      9.7 (L)             05/20/2024                HCT                      28.7 (L)            05/20/2024                MCV                      91.5                05/18/2024                PLT                      163                 05/18/2024             Lab Results      Component                Value               Date                      NA                       143  05/20/2024                K                        3.9                 05/20/2024                CO2                      27                  05/20/2024                GLUCOSE                  99                  05/20/2024                BUN                      11                  05/20/2024                CREATININE               0.68                05/20/2024                CALCIUM                   7.6 (L)             05/20/2024                GFR                       121.08              01/18/2016                GFRNONAA                 >60                 05/20/2024              Anesthesia Other Findings   Reproductive/Obstetrics                              Anesthesia Physical Anesthesia Plan  ASA: 3  Anesthesia Plan: General   Post-op Pain Management:    Induction: Intravenous  PONV Risk Score and Plan: 2 and Ondansetron , Dexamethasone , Midazolam  and Treatment may vary due to age or medical condition  Airway Management Planned: Mask and Oral ETT  Additional Equipment: None  Intra-op Plan:   Post-operative Plan: Extubation in OR  Informed Consent: I have reviewed the patients History and Physical, chart, labs and discussed the procedure including the risks, benefits and alternatives for the proposed anesthesia with the patient or authorized representative who has indicated his/her understanding and acceptance.     Dental advisory given  Plan Discussed with: CRNA  Anesthesia Plan Comments:         Anesthesia Quick Evaluation  "

## 2024-05-21 ENCOUNTER — Encounter (HOSPITAL_COMMUNITY): Payer: Self-pay | Admitting: Gastroenterology

## 2024-05-21 DIAGNOSIS — K922 Gastrointestinal hemorrhage, unspecified: Secondary | ICD-10-CM | POA: Diagnosis not present

## 2024-05-21 DIAGNOSIS — D500 Iron deficiency anemia secondary to blood loss (chronic): Secondary | ICD-10-CM | POA: Diagnosis not present

## 2024-05-21 DIAGNOSIS — D649 Anemia, unspecified: Secondary | ICD-10-CM | POA: Diagnosis not present

## 2024-05-21 LAB — BASIC METABOLIC PANEL WITH GFR
Anion gap: 7 (ref 5–15)
BUN: 15 mg/dL (ref 8–23)
CO2: 26 mmol/L (ref 22–32)
Calcium: 7.9 mg/dL — ABNORMAL LOW (ref 8.9–10.3)
Chloride: 106 mmol/L (ref 98–111)
Creatinine, Ser: 0.9 mg/dL (ref 0.44–1.00)
GFR, Estimated: >60 mL/min
Glucose, Bld: 192 mg/dL — ABNORMAL HIGH (ref 70–99)
Potassium: 4.9 mmol/L (ref 3.5–5.1)
Sodium: 139 mmol/L (ref 135–145)

## 2024-05-21 LAB — CBC
HCT: 23.8 % — ABNORMAL LOW (ref 36.0–46.0)
Hemoglobin: 8.1 g/dL — ABNORMAL LOW (ref 12.0–15.0)
MCH: 30 pg (ref 26.0–34.0)
MCHC: 34 g/dL (ref 30.0–36.0)
MCV: 88.1 fL (ref 80.0–100.0)
Platelets: 151 K/uL (ref 150–400)
RBC: 2.7 MIL/uL — ABNORMAL LOW (ref 3.87–5.11)
RDW: 14.7 % (ref 11.5–15.5)
WBC: 16.8 K/uL — ABNORMAL HIGH (ref 4.0–10.5)
nRBC: 0 % (ref 0.0–0.2)

## 2024-05-21 LAB — GLUCOSE, CAPILLARY
Glucose-Capillary: 158 mg/dL — ABNORMAL HIGH (ref 70–99)
Glucose-Capillary: 151 mg/dL — ABNORMAL HIGH (ref 70–99)
Glucose-Capillary: 211 mg/dL — ABNORMAL HIGH (ref 70–99)
Glucose-Capillary: 217 mg/dL — ABNORMAL HIGH (ref 70–99)

## 2024-05-21 LAB — HEMOGLOBIN AND HEMATOCRIT, BLOOD
HCT: 19.4 % — ABNORMAL LOW (ref 36.0–46.0)
HCT: 19.9 % — ABNORMAL LOW (ref 36.0–46.0)
HCT: 21.4 % — ABNORMAL LOW (ref 36.0–46.0)
HCT: 21.6 % — ABNORMAL LOW (ref 36.0–46.0)
Hemoglobin: 6.4 g/dL — CL (ref 12.0–15.0)
Hemoglobin: 6.8 g/dL — CL (ref 12.0–15.0)
Hemoglobin: 7.2 g/dL — ABNORMAL LOW (ref 12.0–15.0)
Hemoglobin: 7.3 g/dL — ABNORMAL LOW (ref 12.0–15.0)

## 2024-05-21 LAB — PREPARE RBC (CROSSMATCH)

## 2024-05-21 MED ORDER — SODIUM CHLORIDE 0.9 % IV SOLN: INTRAVENOUS | Status: AC

## 2024-05-21 MED ORDER — SODIUM CHLORIDE 0.9% IV SOLUTION: Freq: Once | INTRAVENOUS | Status: DC

## 2024-05-21 NOTE — Progress Notes (Addendum)
 SBP in 80s with MAP < 65 at 1900.  After waking pt up and readjusting cuff, BP improved.  Pt's BP 115/53 (69) at 2000, pt complaining of 7/10 pain.  PRN 0.5 dilaudid  IV given to pt at 2020.  Pt's BP at 2040 was 78/44 (55).  Lavanda Horns, NP notified.  NP also informed that pt has only had 50 cc of urine output from foley from 1700-2030.  Pt very edematous.  Order for albumin placed.    Charlott Horns, NP at [phone].  Pt's BP has improved greatly.  U/O only 45 cc from 2030-2315.  Bladder scan reading 130.  Flushed foley per order from [place], NP.  No improvement.  Per Horns, NP, encourage pt to drink more fluids.  No other orders at this time.     Received critical hgb from lab.  Hgb 6.8.  Notified Lavanda Horns, NP at [phone].  NP put in orders for a recollect.  Second critical hgb 6.4. Notified NP at 0208.  Orders placed for 1 unit PRBC.     U/O still low, suboptimal po intake.  Horns, NP adding IV fluids.

## 2024-05-21 NOTE — Progress Notes (Signed)
 "  Trauma/Critical Care Follow Up Note  Subjective:    Overnight Issues: None reported. No BM since OR. Pain well controlled. In good spirits. No n/v. Tolerating clears.  Objective:  Vital signs for last 24 hours: Temp:  [97 F (36.1 C)-98.6 F (37 C)] 98 F (36.7 C) (01/01 0430) Pulse Rate:  [63-94] 79 (01/01 0900) Resp:  [6-21] 14 (01/01 0900) BP: (78-167)/(44-82) 131/53 (01/01 0900) SpO2:  [90 %-100 %] 100 % (01/01 0900)  Intake/Output from previous day: 12/31 0701 - 01/01 0700 In: 2537.9 [P.O.:240; I.V.:946.3; Blood:630; IV Piggyback:721.5] Out: 1192 [Urine:1172; Blood:20]  Intake/Output this shift: Total I/O In: 465 [P.O.:240; I.V.:225] Out: -    Physical Exam:  Gen: comfortable, no distress Neuro: follows commands, alert, communicative HEENT: PERRL Neck: supple CV: RRR Pulm: unlabored breathing on RA Abd: soft, NT, ND; incision c/d/I without erythema/drainage  GU: urine clear and yellow, +spontaneous voids Extr: wwp, no edema  Results for orders placed or performed during the hospital encounter of 05/06/24 (from the past 24 hours)  Hemoglobin and hematocrit, blood     Status: Abnormal   Collection Time: 05/20/24 12:54 PM  Result Value Ref Range   Hemoglobin 9.7 (L) 12.0 - 15.0 g/dL   HCT 71.2 (L) 63.9 - 53.9 %  Glucose, capillary     Status: Abnormal   Collection Time: 05/20/24 12:59 PM  Result Value Ref Range   Glucose-Capillary 110 (H) 70 - 99 mg/dL  Glucose, capillary     Status: Abnormal   Collection Time: 05/20/24  4:43 PM  Result Value Ref Range   Glucose-Capillary 172 (H) 70 - 99 mg/dL   Comment 1 Notify RN   Hemoglobin and hematocrit, blood     Status: Abnormal   Collection Time: 05/20/24  7:21 PM  Result Value Ref Range   Hemoglobin 8.1 (L) 12.0 - 15.0 g/dL   HCT 75.5 (L) 63.9 - 53.9 %  Glucose, capillary     Status: Abnormal   Collection Time: 05/20/24  9:15 PM  Result Value Ref Range   Glucose-Capillary 237 (H) 70 - 99 mg/dL  Hemoglobin  and hematocrit, blood     Status: Abnormal   Collection Time: 05/21/24 12:07 AM  Result Value Ref Range   Hemoglobin 6.8 (LL) 12.0 - 15.0 g/dL   HCT 80.0 (L) 63.9 - 53.9 %  Hemoglobin and hematocrit, blood     Status: Abnormal   Collection Time: 05/21/24  1:41 AM  Result Value Ref Range   Hemoglobin 6.4 (LL) 12.0 - 15.0 g/dL   HCT 80.5 (L) 63.9 - 53.9 %  Prepare RBC (crossmatch)     Status: None   Collection Time: 05/21/24  2:18 AM  Result Value Ref Range   Order Confirmation      ORDER PROCESSED BY BLOOD BANK BB SAMPLE OR UNITS ALREADY AVAILABLE Performed at [facility], [address], [place], [place] [address]   Basic metabolic panel with GFR     Status: Abnormal   Collection Time: 05/21/24  6:17 AM  Result Value Ref Range   Sodium 139 135 - 145 mmol/L   Potassium 4.9 3.5 - 5.1 mmol/L   Chloride 106 98 - 111 mmol/L   CO2 26 22 - 32 mmol/L   Glucose, Bld 192 (H) 70 - 99 mg/dL   BUN 15 8 - 23 mg/dL   Creatinine, Ser 9.09 0.44 - 1.00 mg/dL   Calcium  7.9 (L) 8.9 - 10.3 mg/dL   GFR, Estimated >39 >39  mL/min   Anion gap 7 5 - 15  CBC     Status: Abnormal   Collection Time: 05/21/24  6:17 AM  Result Value Ref Range   WBC 16.8 (H) 4.0 - 10.5 K/uL   RBC 2.70 (L) 3.87 - 5.11 MIL/uL   Hemoglobin 8.1 (L) 12.0 - 15.0 g/dL   HCT 76.1 (L) 63.9 - 53.9 %   MCV 88.1 80.0 - 100.0 fL   MCH 30.0 26.0 - 34.0 pg   MCHC 34.0 30.0 - 36.0 g/dL   RDW 85.2 88.4 - 84.4 %   Platelets 151 150 - 400 K/uL   nRBC 0.0 0.0 - 0.2 %  Glucose, capillary     Status: Abnormal   Collection Time: 05/21/24  7:43 AM  Result Value Ref Range   Glucose-Capillary 158 (H) 70 - 99 mg/dL    Assessment & Plan:  LOS: 15 days   Additional comments:I reviewed the patient's new clinical lab test results.   and I reviewed the patients new imaging test results.    LGIB, s/p right colon IR embolization and colonoscopy 05/12/2024 - 1 cm ulcer at hepatic flexure status post 2 hemoclips. Repeat CTA 12/25 AM with  persistent bleed and underwent repeat AE. Per Dr. Jennefer, potential benefit of more proximal embo. Discussed, and have also discussed with the patient, the potential risk of ongoing bleeding or resultant colonic ischemia necessitating surgical intervention, and she is agreeable if this becomes necessary.   - Hgb 8.1 post transfusion.    FEN - tolerating liquids - advanced to d3 diet today  VTE - SCDs; hold for bleeding issues - if hgb remains stable 05/22/24, may consider starting SQH vs lovenox   Dispo - transfer to progressive/4np  CRITICAL CARE Performed by: Lonni CHRISTELLA Pizza   Total critical care time: 30 minutes  Critical care time was exclusive of separately billable procedures and treating other patients.  Critical care was necessary to treat or prevent imminent or life-threatening deterioration.  Critical care was time spent personally by me on the following activities: development of treatment plan with patient and/or surrogate as well as nursing, discussions with consultants, evaluation of patient's response to treatment, examination of patient, obtaining history from patient or surrogate, ordering and performing treatments and interventions, ordering and review of laboratory studies, ordering and review of radiographic studies, pulse oximetry and re-evaluation of patient's condition.    Lonni CHRISTELLA Pizza, MD  Trauma & General Surgery Please use [email or web address] to contact on call provider  05/21/2024  *Care during the described time interval was provided by me. I have reviewed this patient's available data, including medical history, events of note, physical examination and test results as part of my evaluation.    "

## 2024-05-21 NOTE — Progress Notes (Signed)
 "          [facility]                                                                              [address], is a 64 y.o. female, DOB - 26-Apr-1961, FMW:[id number] Admit date - 05/06/2024    Outpatient Primary MD for the patient is Casey Jenkins Jansky, FNP  LOS - 15  days  Chief Complaint  Patient presents with   Hematochezia       Brief summary   Patient is a 64 year old female with DM type II, HTN, HLD, OSA on no longer utilizing CPAP, GERD and anxiety who presented to the ED at [facility] 12/17 for complaints of shortness of breath and hematochezia. Of note patient has history of gastric and small bowel AVMs with active GI bleed in the past, recent colonoscopy with diverticular disease.  Patient was found to have active bleeding with hypotension and tachycardia on 12/19, transferred to ICU for close monitoring. Significant Hospital Events:   12/17 presented to [name] ED with hematochezia and shortness of breath 12/18 CT angio with active contrast extravasation involving the hepatic flexure of the colon consistent with active GI bleed > interventional radiology consulted.  Unfortunately patient remains at [facility] and was unable to undergo IR evaluation.  Patient arrived to [phone] at 1913 12/19 ongoing GI bleed overnight with hypotension and tachycardia.  Underwent embolization of right colic branches 12/20  Softer blood pressure today morning.  Had an episode of medium sized bowel movement with bright red blood.  Hemoglobin down to 6.4.  Given 2 units PRBC.  Surgery and GI consulted.  Repeat CTA done 12/23 colonoscopy. Ulcer found, clips placed. 1 PRBC for hgb dip likely reflectie of old blood seen during scope  12/24 HDS hgb stable txf to progressive 12/25 Developed bleed again overnight, the platelet this is less than third time of the trauma she has not had any more bowel movements 5.9. CTA with continued bleed at hepatic flexure and got repeat embolization by  IR. 12/31: Underwent EGD, normal.  Colonoscopy with ulcer at the hepatic flexure and blood in the entire examined colon.  Underwent right laparoscopic colectomy (Dr Lyndel).  Received 2 units blood.  Assessment & Plan     Acute recurrent lower GI bleeding:  Anemia/ABLA acute secondary to major GI bleed - S/p IR embolization x 2 (12/19 and 12/25).   - S/p colonoscopy with clip hepatic flexure ulcer on 05/12/2024, right colon angiodysplasia. - Persistent bleeding, repeat CT on 12/25 is suggestive of bleeding from the hepatic flexure just below the embolization coils of SMA branches to the hepatic flexure. -Underwent repeat angiogram and embolization of right vasa recta branch of the SMA at the site of right hepatic flexure on 12/25. - 12/31: Underwent EGD: Normal esophagus, stomach, duodenum.   Colonoscopy with diverticulosis in the transverse colon, in the ascending colon and left colon. Solitary ulcer at the hepatic flexure blood in the entire examined colon.  Received 1 unit packed RBCs - 12/31: Underwent laparoscopic right colectomy (Dr Lyndel).  Received 1 unit packed RBCs overnight. - Has received total 26 units of packed RBCs  -Tolerating clears,  diet advanced to dysphagia 3 by surgery.   DM type II, uncontrolled with hyperglycemia CBG (last 3)  Recent Labs    05/20/24 1643 05/20/24 2115 05/21/24 0743  GLUCAP 172* 237* 158*   Continue sliding scale insulin , Lantus  10 units daily   Hyponatremia -resolved   Leukocytosis: - Likely reactive.  Continue to follow, no fevers or acute infectious symptoms.   Anxiety - continue Xanax  as needed   Mild hypocalcemia -Likely close to normal with hypoalbuminemia, albumin level 2.1 on 12/21  Vitamin D  deficiency:  -Vitamin D  level <6.0, started vitamin D  50,000 units p.o. weekly, follow with PCP to repeat vitamin D  level after 3 to 6 months.   Moderate protein calorie malnutrition, hypoalbuminemia Nutrition Problem:  Inadequate oral intake Etiology: acute illness Signs/Symptoms:  (NPO/CLD)  Interventions: Refer to RD note for recommendations  Obesity class I Estimated body mass index is 32.45 kg/m as calculated from the following:   Height as of this encounter: 5' 5 (1.651 m).   Weight as of this encounter: 88.5 kg.  Code Status: Full code DVT Prophylaxis:  Place and maintain sequential compression device Start: 05/17/24 1741 SCDs Start: 05/06/24 1732   Level of Care: Level of care: Progressive Family Communication: Updated patient Disposition Plan:      Remains inpatient appropriate:      Procedures:    Consultants:   Gastroenterology General Surgery IR Critical care  Antimicrobials:   Anti-infectives (From admission, onward)    Start     Dose/Rate Route Frequency Ordered Stop   05/20/24 1230  cefoTEtan (CEFOTAN) 1 g in sodium chloride  0.9 % 100 mL IVPB        1 g 200 mL/hr over 30 Minutes Intravenous Every 12 hours 05/20/24 1218            Medications  sodium chloride    Intravenous Once   sodium chloride    Intravenous Once   sodium chloride    Intravenous Once   sodium chloride    Intravenous Once   acetaminophen   650 mg Oral Q6H   atorvastatin   20 mg Oral Daily   Chlorhexidine  Gluconate Cloth  6 each Topical Daily   docusate sodium  100 mg Oral BID   feeding supplement  1 Container Oral TID BM   insulin  aspart  0-15 Units Subcutaneous TID WC   insulin  aspart  0-5 Units Subcutaneous QHS   insulin  glargine  10 Units Subcutaneous Daily   multivitamin with minerals  1 tablet Oral Daily   pantoprazole   40 mg Oral BID   sodium chloride  flush  3 mL Intravenous Q12H   Vitamin D  (Ergocalciferol )  50,000 Units Oral Q Mon      Subjective:   Casey Johnston was seen and examined today.  No acute issues.  Tolerating clear liquid diet.  No acute nausea vomiting abdominal pain or active bleeding.   Objective:   Vitals:   05/21/24 0600 05/21/24 0700 05/21/24 0800  05/21/24 0900  BP: (!) 119/55 (!) 137/57 (!) 143/55 (!) 131/53  Pulse: 75  79 79  Resp: (!) 9 15 17 14   Temp:   98.1 F (36.7 C)   TempSrc:   Axillary   SpO2: 100%  100% 100%  Weight:      Height:        Intake/Output Summary (Last 24 hours) at 05/21/2024 1145 Last data filed at 05/21/2024 0900 Gross per 24 hour  Intake 2687.87 ml  Output 1192 ml  Net 1495.87 ml     Wt  Readings from Last 3 Encounters:  05/12/24 88.5 kg  04/21/24 88.5 kg  04/05/24 90.2 kg   Physical Exam General: Alert and oriented x 3, NAD Cardiovascular: S1 S2 clear, RRR.  Respiratory: CTAB, no wheezing Gastrointestinal: Soft, NT, ND, incision CDI Ext: no pedal edema bilaterally Neuro: no new deficits Psych: Normal affect    Data Reviewed:  I have personally reviewed following labs    CBC Lab Results  Component Value Date   WBC 16.8 (H) 05/21/2024   RBC 2.70 (L) 05/21/2024   HGB 8.1 (L) 05/21/2024   HCT 23.8 (L) 05/21/2024   MCV 88.1 05/21/2024   MCH 30.0 05/21/2024   PLT 151 05/21/2024   MCHC 34.0 05/21/2024   RDW 14.7 05/21/2024   LYMPHSABS 4.1 (H) 05/06/2024   MONOABS 0.6 05/06/2024   EOSABS 0.1 05/06/2024   BASOSABS 0.1 05/06/2024     Last metabolic panel Lab Results  Component Value Date   NA 139 05/21/2024   K 4.9 05/21/2024   CL 106 05/21/2024   CO2 26 05/21/2024   BUN 15 05/21/2024   CREATININE 0.90 05/21/2024   GLUCOSE 192 (H) 05/21/2024   GFRNONAA >60 05/21/2024   GFRAA >60 02/09/2020   CALCIUM  7.9 (L) 05/21/2024   PHOS 3.7 05/19/2024   PROT 3.7 (L) 05/10/2024   ALBUMIN 2.1 (L) 05/10/2024   BILITOT 0.3 05/10/2024   ALKPHOS 41 05/10/2024   AST 22 05/10/2024   ALT 7 05/10/2024   ANIONGAP 7 05/21/2024    CBG (last 3)  Recent Labs    05/20/24 1643 05/20/24 2115 05/21/24 0743  GLUCAP 172* 237* 158*      Coagulation Profile: No results for input(s): INR, PROTIME in the last 168 hours.   Radiology Studies: I have personally reviewed the imaging studies   No results found.     Casey Johnston M.D. [facility] 05/21/2024, 11:45 AM  Available via Epic secure chat 7am-7pm After 7 pm, please refer to night coverage provider listed on amion.    "

## 2024-05-22 DIAGNOSIS — D649 Anemia, unspecified: Secondary | ICD-10-CM | POA: Diagnosis not present

## 2024-05-22 DIAGNOSIS — K922 Gastrointestinal hemorrhage, unspecified: Secondary | ICD-10-CM | POA: Diagnosis not present

## 2024-05-22 LAB — COMPREHENSIVE METABOLIC PANEL WITH GFR
ALT: 16 U/L (ref 0–44)
AST: 24 U/L (ref 15–41)
Albumin: 2.3 g/dL — ABNORMAL LOW (ref 3.5–5.0)
Alkaline Phosphatase: 56 U/L (ref 38–126)
Anion gap: 8 (ref 5–15)
BUN: 13 mg/dL (ref 8–23)
CO2: 23 mmol/L (ref 22–32)
Calcium: 7.6 mg/dL — ABNORMAL LOW (ref 8.9–10.3)
Chloride: 104 mmol/L (ref 98–111)
Creatinine, Ser: 0.95 mg/dL (ref 0.44–1.00)
GFR, Estimated: >60 mL/min
Glucose, Bld: 164 mg/dL — ABNORMAL HIGH (ref 70–99)
Potassium: 4.4 mmol/L (ref 3.5–5.1)
Sodium: 135 mmol/L (ref 135–145)
Total Bilirubin: 0.2 mg/dL (ref 0.0–1.2)
Total Protein: 4.2 g/dL — ABNORMAL LOW (ref 6.5–8.1)

## 2024-05-22 LAB — HEMOGLOBIN AND HEMATOCRIT, BLOOD
HCT: 19.2 % — ABNORMAL LOW (ref 36.0–46.0)
HCT: 20.8 % — ABNORMAL LOW (ref 36.0–46.0)
HCT: 22.8 % — ABNORMAL LOW (ref 36.0–46.0)
HCT: 23.7 % — ABNORMAL LOW (ref 36.0–46.0)
Hemoglobin: 6.6 g/dL — CL (ref 12.0–15.0)
Hemoglobin: 7.2 g/dL — ABNORMAL LOW (ref 12.0–15.0)
Hemoglobin: 7.8 g/dL — ABNORMAL LOW (ref 12.0–15.0)
Hemoglobin: 7.9 g/dL — ABNORMAL LOW (ref 12.0–15.0)

## 2024-05-22 LAB — CBC
HCT: 24.4 % — ABNORMAL LOW (ref 36.0–46.0)
Hemoglobin: 8.2 g/dL — ABNORMAL LOW (ref 12.0–15.0)
MCH: 29.4 pg (ref 26.0–34.0)
MCHC: 33.6 g/dL (ref 30.0–36.0)
MCV: 87.5 fL (ref 80.0–100.0)
Platelets: 133 K/uL — ABNORMAL LOW (ref 150–400)
RBC: 2.79 MIL/uL — ABNORMAL LOW (ref 3.87–5.11)
RDW: 16.4 % — ABNORMAL HIGH (ref 11.5–15.5)
WBC: 15.1 K/uL — ABNORMAL HIGH (ref 4.0–10.5)
nRBC: 0 % (ref 0.0–0.2)

## 2024-05-22 LAB — GLUCOSE, CAPILLARY
Glucose-Capillary: 172 mg/dL — ABNORMAL HIGH (ref 70–99)
Glucose-Capillary: 180 mg/dL — ABNORMAL HIGH (ref 70–99)
Glucose-Capillary: 158 mg/dL — ABNORMAL HIGH (ref 70–99)
Glucose-Capillary: 163 mg/dL — ABNORMAL HIGH (ref 70–99)

## 2024-05-22 LAB — PREPARE RBC (CROSSMATCH)

## 2024-05-22 LAB — TYPE AND SCREEN
ABO/RH(D): O POS
Antibody Screen: NEGATIVE

## 2024-05-22 MED ORDER — ENSURE PLUS HIGH PROTEIN PO LIQD
237.0000 mL | Freq: Three times a day (TID) | ORAL | Status: DC
  Administered 2024-05-22: 237 mL via ORAL

## 2024-05-22 MED ORDER — SODIUM CHLORIDE 0.9% IV SOLUTION: Freq: Once | INTRAVENOUS | Status: DC

## 2024-05-22 MED ORDER — POLYETHYLENE GLYCOL 3350 17 G PO PACK: 17.0000 g | PACK | Freq: Every day | ORAL | Status: DC | PRN

## 2024-05-22 NOTE — Progress Notes (Signed)
 Nutrition Follow-up  DOCUMENTATION CODES:  Not applicable  INTERVENTION:  Ensure Plus High Protein po TID, each supplement provides 350 kcal and 20 grams of protein D/C Boost Breeze per patient preference Continue MVI with minerals daily  NUTRITION DIAGNOSIS:  Inadequate oral intake related to acute illness as evidenced by  (NPO/CLD); ongoing, diet has been advanced to dysphagia 3 with thin liquids.  GOAL:  Patient will meet greater than or equal to 90% of their needs; progressing.  MONITOR:  PO intake, Supplement acceptance, Diet advancement, Labs  REASON FOR ASSESSMENT:  NPO/Clear Liquid Diet    ASSESSMENT:  64 year old female with a PMH of HTN, HLD, DM, GERD, IBS, diverticulosis,gastric ulcers, duodenal ulcers, GI bleeds form duodenal AVMs, HTN, hyperlipidemia, and GERD. Admitted for dark melanotic stools and progressive weakness. Recent admission 04/04/24-04/12/24 with similar GI bleed.  Patient admitted with lower, GIB; S/P embolization in IR; re-bled, S/P clip x 2 hepatic flexure ulcer. S/P upper endoscopy and colonoscopy 12/31. S/P R colectomy for bleeding right colon ulcer.  12/23: clear liquids 12/24-26: regular diet (average intake 67%) 12/27-28: NPO, clear liquids, full liquids 12/29-31: clear liquids (100% x 1 meal), NPO 1/1: dysphagia 3 with thin liquids   Patient states that she is not hungry. She ate all of the Cheerios and some of the milk for breakfast and half of her lunch (tilapia, broccoli, and macaroni & cheese). She has been being offered Boost Breeze TID, but she does not like it and has not been drinking it. Discussed the importance of adequate protein and calorie intake for healing and recovery. Patient agreed to try vanilla Ensure supplements.   Labs reviewed.  CBG: [phone]  Medications reviewed and include colace, novolog , lantus , MVI with minerals, protonix , vitamin D .  Diet Order:   Diet Order             DIET DYS 3 Room service  appropriate? Yes with Assist; Fluid consistency: Thin  Diet effective now                   EDUCATION NEEDS:  Education needs have been addressed  Skin:  Skin Assessment: Reviewed RN Assessment  Last BM:  12/30 type 6, 7  Height:  Ht Readings from Last 1 Encounters:  05/12/24 5' 5 (1.651 m)   Weight:  Wt Readings from Last 1 Encounters:  05/12/24 88.5 kg   BMI:  Body mass index is 32.45 kg/m.  Estimated Nutritional Needs:  Kcal:  2000-2200 kcal Protein:  105-125 gm Fluid:  >2L/day   Suzen HUNT RD, LDN, CNSC Contact via secure chat. If unavailable, use group chat RD Inpatient.

## 2024-05-22 NOTE — Evaluation (Signed)
 Physical Therapy Evaluation Patient Details Name: Casey Johnston MRN: [id number] DOB: 01-17-1961 Today's Date: 05/22/2024  History of Present Illness  Patient is a 64 year old female who presented to the ED at [facility] 12/17 for complaints of shortness of breath and hematochezia. Patient was found to have active bleeding with hypotension and tachycardia on 12/19, transferred to ICU for close monitoring. Colonoscopy with ulcer at the hepatic flexure and blood in the entire examined colon.  Underwent right laparoscopic colectomy on 12/31. DM type II, HTN, HLD, OSA on no longer utilizing CPAP, GERD and anxiety.  Clinical Impression  Pt presents with admitting diagnosis above. Pt today was able to ambulate in hallway with IV pole CGA. PTA pt was fully independent. Recommend HHPT upon DC. Pt would benefit from continued mobility with mobility specialist during acute stay. PT will continue to follow.         If plan is discharge home, recommend the following: A little help with walking and/or transfers;A little help with bathing/dressing/bathroom;Assistance with cooking/housework;Assist for transportation;Help with stairs or ramp for entrance   Can travel by private vehicle        Equipment Recommendations None recommended by PT  Recommendations for Other Services       Functional Status Assessment Patient has had a recent decline in their functional status and demonstrates the ability to make significant improvements in function in a reasonable and predictable amount of time.     Precautions / Restrictions Precautions Precautions: Fall Recall of Precautions/Restrictions: Intact Restrictions Weight Bearing Restrictions Per Provider Order: No      Mobility  Bed Mobility Overal bed mobility: Needs Assistance Bed Mobility: Supine to Sit     Supine to sit: Min assist     General bed mobility comments: Light Min A for scoot hips forward. Pt left seated EOB.    Transfers Overall  transfer level: Needs assistance Equipment used: None Transfers: Sit to/from Stand Sit to Stand: Contact guard assist           General transfer comment: CGA for safety    Ambulation/Gait Ambulation/Gait assistance: Contact guard assist, Supervision Gait Distance (Feet): 125 Feet Assistive device: IV Pole Gait Pattern/deviations: WFL(Within Functional Limits) Gait velocity: decreased     General Gait Details: Pt able to ambulate in hallway with IV pole. no LOB noted however pt reporting fatigue and SOB.  Stairs            Wheelchair Mobility     Tilt Bed    Modified Rankin (Stroke Patients Only)       Balance Overall balance assessment: Mild deficits observed, not formally tested                                           Pertinent Vitals/Pain Pain Assessment Pain Assessment: 0-10 Pain Score: 4  Pain Location: Stomach Pain Descriptors / Indicators: Sore Pain Intervention(s): Limited activity within patient's tolerance, Monitored during session, Repositioned    Home Living Family/patient expects to be discharged to:: Private residence Living Arrangements: Alone Available Help at Discharge: Family;Neighbor;Friend(s);Available PRN/intermittently Type of Home: House Home Access: Stairs to enter Entrance Stairs-Rails: Can reach both Entrance Stairs-Number of Steps: 3 Alternate Level Stairs-Number of Steps: 13 Home Layout: Two level;Able to live on main level with bedroom/bathroom Home Equipment: Hand held shower head      Prior Function Prior Level of Function : Independent/Modified Independent;Driving  Mobility Comments: Ind ADLs Comments: Ind     Extremity/Trunk Assessment   Upper Extremity Assessment Upper Extremity Assessment: Overall WFL for tasks assessed    Lower Extremity Assessment Lower Extremity Assessment: Overall WFL for tasks assessed    Cervical / Trunk Assessment Cervical / Trunk Assessment:  Normal  Communication   Communication Communication: No apparent difficulties    Cognition Arousal: Alert Behavior During Therapy: WFL for tasks assessed/performed   PT - Cognitive impairments: No apparent impairments                         Following commands: Intact       Cueing Cueing Techniques: Verbal cues     General Comments General comments (skin integrity, edema, etc.): HR up to 121 during ambulation    Exercises     Assessment/Plan    PT Assessment Patient needs continued PT services  PT Problem List Decreased strength;Decreased range of motion;Decreased balance;Decreased activity tolerance;Decreased mobility;Decreased coordination;Decreased knowledge of use of DME;Decreased safety awareness;Cardiopulmonary status limiting activity;Decreased knowledge of precautions       PT Treatment Interventions DME instruction;Gait training;Functional mobility training;Stair training;Therapeutic activities;Therapeutic exercise;Balance training;Neuromuscular re-education;Cognitive remediation;Patient/family education    PT Goals (Current goals can be found in the Care Plan section)       Frequency Min 2X/week     Co-evaluation               AM-PAC PT 6 Clicks Mobility  Outcome Measure Help needed turning from your back to your side while in a flat bed without using bedrails?: A Little Help needed moving from lying on your back to sitting on the side of a flat bed without using bedrails?: A Little Help needed moving to and from a bed to a chair (including a wheelchair)?: A Little Help needed standing up from a chair using your arms (e.g., wheelchair or bedside chair)?: A Little Help needed to walk in hospital room?: A Little Help needed climbing 3-5 steps with a railing? : A Little 6 Click Score: 18    End of Session Equipment Utilized During Treatment: Gait belt Activity Tolerance: Patient tolerated treatment well Patient left: in bed;with call  bell/phone within reach;with family/visitor present Nurse Communication: Mobility status PT Visit Diagnosis: Other abnormalities of gait and mobility (R26.89)    Time: 8473-8457 PT Time Calculation (min) (ACUTE ONLY): 16 min   Charges:   PT Evaluation $PT Eval Low Complexity: 1 Low   PT General Charges $$ ACUTE PT VISIT: 1 Visit         Sueellen NOVAK, PT, DPT Acute Rehab Services [phone]   Rue Tinnel 05/22/2024, 4:15 PM

## 2024-05-22 NOTE — Progress Notes (Signed)
 Patient ID: Casey Johnston, female   DOB: August 08, 1960, 64 y.o.   MRN: [id number] 2 Days Post-Op    Subjective: + flatus, tolerated soft diet, no BM ROS negative except as listed above. Objective: Vital signs in last 24 hours: Temp:  [97.7 F (36.5 C)-99.3 F (37.4 C)] 99.3 F (37.4 C) (01/02 0628) Pulse Rate:  [66-86] 81 (01/02 0628) Resp:  [6-22] 17 (01/02 0628) BP: (94-149)/(43-88) 127/55 (01/02 0628) SpO2:  [90 %-100 %] 99 % (01/02 0628) Last BM Date : 05/20/24  Intake/Output from previous day: 01/01 0701 - 01/02 0700 In: 1780.9 [P.O.:240; I.V.:1015.5; Blood:315; IV Piggyback:210.5] Out: 825 [Urine:825] Intake/Output this shift: No intake/output data recorded.  GI: soft, incision CDI  Lab Results: CBC  Recent Labs    05/21/24 0617 05/21/24 1215 05/21/24 2345 05/22/24 0509  WBC 16.8*  --   --  15.1*  HGB 8.1*   < > 6.6* 8.2*  HCT 23.8*   < > 19.2* 24.4*  PLT 151  --   --  133*   < > = values in this interval not displayed.   BMET Recent Labs    05/21/24 0617 05/22/24 0509  NA 139 135  K 4.9 4.4  CL 106 104  CO2 26 23  GLUCOSE 192* 164*  BUN 15 13  CREATININE 0.90 0.95  CALCIUM  7.9* 7.6*   PT/INR No results for input(s): LABPROT, INR in the last 72 hours. ABG No results for input(s): PHART, HCO3 in the last 72 hours.  Invalid input(s): PCO2, PO2  Studies/Results: No results found.  Anti-infectives: Anti-infectives (From admission, onward)    Start     Dose/Rate Route Frequency Ordered Stop   05/20/24 1230  cefoTEtan (CEFOTAN) 1 g in sodium chloride  0.9 % 100 mL IVPB        1 g 200 mL/hr over 30 Minutes Intravenous Every 12 hours 05/20/24 1218         Assessment/Plan: LGIB, s/p right colon IR embolization and colonoscopy 05/12/2024 - 1 cm ulcer at hepatic flexure status post 2 hemoclips. Repeat CTA 12/25 AM with persistent bleed and underwent repeat AE.   S/P laparoscopic R colectomy 12/31, Dr. Lyndel  - Hgb 8.2 post  transfusion 1u PRBC   FEN - tolerating D3 diet, AROBF  VTE - SCDs; hold LMWH for bleeding issues   Dispo - 4E, PT eval  LOS: 16 days    Dann Hummer, MD, MPH, FACS Trauma & General Surgery Use [email or web address] to contact on call provider  05/22/2024

## 2024-05-22 NOTE — Anesthesia Postprocedure Evaluation (Signed)
"   Anesthesia Post Note  Patient: Casey Johnston  Procedure(s) Performed: COLECTOMY, RIGHT, LAPAROSCOPIC (Abdomen)     Patient location during evaluation: PACU Anesthesia Type: General Level of consciousness: awake and alert Pain management: pain level controlled Vital Signs Assessment: post-procedure vital signs reviewed and stable Respiratory status: spontaneous breathing, nonlabored ventilation, respiratory function stable and patient connected to nasal cannula oxygen Cardiovascular status: blood pressure returned to baseline and stable Postop Assessment: no apparent nausea or vomiting Anesthetic complications: no   No notable events documented.  Last Vitals:  Vitals:   05/22/24 1106 05/22/24 1626  BP: (!) 153/47 (!) 145/53  Pulse:  89  Resp: 17 18  Temp: 36.7 C 37.4 C  SpO2: 98% 90%    Last Pain:  Vitals:   05/22/24 1742  TempSrc:   PainSc: 8                  Samaj Wessells P Sota Hetz      "

## 2024-05-22 NOTE — Progress Notes (Signed)
 Critical received from lab.  Hgb 6.6.  Notified Lavanda Horns, NP at 0010.  Orders for 1u PRBC placed by NP.  NP also informed of pt's MAP < 65.  SBP > 100

## 2024-05-22 NOTE — Progress Notes (Signed)
 "          [facility]                                                                              [address], is a 64 y.o. female, DOB - Aug 15, 1960, FMW:[id number] Admit date - 05/06/2024    Outpatient Primary MD for the patient is Teresa Jenkins Jansky, FNP  LOS - 16  days  Chief Complaint  Patient presents with   Hematochezia       Brief summary   Patient is a 64 year old female with DM type II, HTN, HLD, OSA on no longer utilizing CPAP, GERD and anxiety who presented to the ED at [facility] 12/17 for complaints of shortness of breath and hematochezia. Of note patient has history of gastric and small bowel AVMs with active GI bleed in the past, recent colonoscopy with diverticular disease.  Patient was found to have active bleeding with hypotension and tachycardia on 12/19, transferred to ICU for close monitoring. Significant Hospital Events:   12/17 presented to [name] ED with hematochezia and shortness of breath 12/18 CT angio with active contrast extravasation involving the hepatic flexure of the colon consistent with active GI bleed > interventional radiology consulted.  Unfortunately patient remains at [facility] and was unable to undergo IR evaluation.  Patient arrived to [phone] at 1913 12/19 ongoing GI bleed overnight with hypotension and tachycardia.  Underwent embolization of right colic branches 12/20  Softer blood pressure today morning.  Had an episode of medium sized bowel movement with bright red blood.  Hemoglobin down to 6.4.  Given 2 units PRBC.  Surgery and GI consulted.  Repeat CTA done 12/23 colonoscopy. Ulcer found, clips placed. 1 PRBC for hgb dip likely reflectie of old blood seen during scope  12/24 HDS hgb stable txf to progressive 12/25 Developed bleed again overnight, the platelet this is less than third time of the trauma she has not had any more bowel movements 5.9. CTA with continued bleed at hepatic flexure and got repeat embolization by  IR. 12/31: Underwent EGD, normal.  Colonoscopy with ulcer at the hepatic flexure and blood in the entire examined colon.  Underwent right laparoscopic colectomy (Dr Lyndel).  Received 2 units blood.  Assessment & Plan     Acute recurrent lower GI bleeding:  Anemia/ABLA acute secondary to major GI bleed - S/p IR embolization x 2 (12/19 and 12/25).   - S/p colonoscopy with clip hepatic flexure ulcer on 05/12/2024, right colon angiodysplasia. - Persistent bleeding, repeat CT on 12/25 is suggestive of bleeding from the hepatic flexure just below the embolization coils of SMA branches to the hepatic flexure. -Underwent repeat angiogram and embolization of right vasa recta branch of the SMA at the site of right hepatic flexure on 12/25. - 12/31: Underwent EGD: Normal esophagus, stomach, duodenum.   Colonoscopy with diverticulosis in the transverse colon, in the ascending colon and left colon. Solitary ulcer at the hepatic flexure blood in the entire examined colon.  Received 1 unit packed RBCs - 12/31: Underwent laparoscopic right colectomy (Dr Lyndel).  Received 1 unit packed RBCs overnight. - Overnight hemoglobin 6.6, no active bleeding, received another unit of packed  RBCs earlier this morning - Has received total 27 units of packed RBCs  - Tolerating soft diet -Increase mobility, OOB, PT OT evaluation   DM type II, uncontrolled with hyperglycemia CBG (last 3)  Recent Labs    05/21/24 1736 05/21/24 2123 05/22/24 0933  GLUCAP 211* 217* 172*   Continue sliding scale insulin , Lantus  10 units daily   Hyponatremia -resolved   Leukocytosis: - Likely reactive.  Continue to follow, no fevers or acute infectious symptoms.   Anxiety - continue Xanax  as needed   Mild hypocalcemia -Likely close to normal with hypoalbuminemia, albumin level 2.1 on 12/21  Vitamin D  deficiency:  -Vitamin D  level <6.0, started vitamin D  50,000 units p.o. weekly, follow with PCP to repeat  vitamin D  level after 3 to 6 months.   Moderate protein calorie malnutrition, hypoalbuminemia Nutrition Problem: Inadequate oral intake Etiology: acute illness Signs/Symptoms:  (NPO/CLD)  Interventions: Refer to RD note for recommendations  Obesity class I Estimated body mass index is 32.45 kg/m as calculated from the following:   Height as of this encounter: 5' 5 (1.651 m).   Weight as of this encounter: 88.5 kg.  Code Status: Full code DVT Prophylaxis:  Place and maintain sequential compression device Start: 05/17/24 1741 SCDs Start: 05/06/24 1732   Level of Care: Level of care: Progressive Family Communication: Updated patient Disposition Plan:      Remains inpatient appropriate:   Need PT OT evaluation, may need rehab if not at baseline.   Procedures:    Consultants:   Gastroenterology General Surgery IR Critical care  Antimicrobials:   Anti-infectives (From admission, onward)    Start     Dose/Rate Route Frequency Ordered Stop   05/20/24 1230  cefoTEtan (CEFOTAN) 1 g in sodium chloride  0.9 % 100 mL IVPB        1 g 200 mL/hr over 30 Minutes Intravenous Every 12 hours 05/20/24 1218            Medications  sodium chloride    Intravenous Once   sodium chloride    Intravenous Once   sodium chloride    Intravenous Once   sodium chloride    Intravenous Once   sodium chloride    Intravenous Once   acetaminophen   650 mg Oral Q6H   atorvastatin   20 mg Oral Daily   Chlorhexidine  Gluconate Cloth  6 each Topical Daily   docusate sodium  100 mg Oral BID   feeding supplement  1 Container Oral TID BM   insulin  aspart  0-15 Units Subcutaneous TID WC   insulin  aspart  0-5 Units Subcutaneous QHS   insulin  glargine  10 Units Subcutaneous Daily   multivitamin with minerals  1 tablet Oral Daily   pantoprazole   40 mg Oral BID   sodium chloride  flush  3 mL Intravenous Q12H   Vitamin D  (Ergocalciferol )  50,000 Units Oral Q Mon      Subjective:   Casey Johnston was  seen and examined today.  States no active bleeding however received 1 unit of packed RBCs overnight for low hemoglobin.  Tolerating soft diet.  No nausea vomiting, chest pain, shortness of breath.    Objective:   Vitals:   05/22/24 0518 05/22/24 0600 05/22/24 0628 05/22/24 1106  BP: (!) 129/50 (!) 140/53 (!) 127/55 (!) 153/47  Pulse: 81 80 81   Resp: 20 12 17 17   Temp:   99.3 F (37.4 C) 98 F (36.7 C)  TempSrc:   Oral Oral  SpO2: 90% 95% 99% 98%  Weight:  Height:        Intake/Output Summary (Last 24 hours) at 05/22/2024 1121 Last data filed at 05/22/2024 0604 Gross per 24 hour  Intake 1315.94 ml  Output 825 ml  Net 490.94 ml     Wt Readings from Last 3 Encounters:  05/12/24 88.5 kg  04/21/24 88.5 kg  04/05/24 90.2 kg   Physical Exam General: Alert and oriented x 3, NAD Cardiovascular: S1 S2 clear, RRR.  Respiratory: CTAB, no wheezing, rales  Gastrointestinal: Soft, nontender, nondistended, NBS, incision CDI Ext: no pedal edema bilaterally Neuro: no new deficits Psych: Normal affect    Data Reviewed:  I have personally reviewed following labs    CBC Lab Results  Component Value Date   WBC 15.1 (H) 05/22/2024   RBC 2.79 (L) 05/22/2024   HGB 7.8 (L) 05/22/2024   HCT 22.8 (L) 05/22/2024   MCV 87.5 05/22/2024   MCH 29.4 05/22/2024   PLT 133 (L) 05/22/2024   MCHC 33.6 05/22/2024   RDW 16.4 (H) 05/22/2024   LYMPHSABS 4.1 (H) 05/06/2024   MONOABS 0.6 05/06/2024   EOSABS 0.1 05/06/2024   BASOSABS 0.1 05/06/2024     Last metabolic panel Lab Results  Component Value Date   NA 135 05/22/2024   K 4.4 05/22/2024   CL 104 05/22/2024   CO2 23 05/22/2024   BUN 13 05/22/2024   CREATININE 0.95 05/22/2024   GLUCOSE 164 (H) 05/22/2024   GFRNONAA >60 05/22/2024   GFRAA >60 02/09/2020   CALCIUM  7.6 (L) 05/22/2024   PHOS 3.7 05/19/2024   PROT 4.2 (L) 05/22/2024   ALBUMIN 2.3 (L) 05/22/2024   BILITOT 0.2 05/22/2024   ALKPHOS 56 05/22/2024   AST 24  05/22/2024   ALT 16 05/22/2024   ANIONGAP 8 05/22/2024    CBG (last 3)  Recent Labs    05/21/24 1736 05/21/24 2123 05/22/24 0933  GLUCAP 211* 217* 172*      Coagulation Profile: No results for input(s): INR, PROTIME in the last 168 hours.   Radiology Studies: I have personally reviewed the imaging studies  No results found.     Nydia Distance M.D. [facility] 05/22/2024, 11:21 AM  Available via Epic secure chat 7am-7pm After 7 pm, please refer to night coverage provider listed on amion.    "

## 2024-05-22 NOTE — Progress Notes (Signed)
 Tx pt to 3E.  Pt will call family in the morning to let them know she moved.

## 2024-05-23 ENCOUNTER — Encounter (HOSPITAL_COMMUNITY): Payer: Self-pay | Admitting: Surgery

## 2024-05-23 DIAGNOSIS — K922 Gastrointestinal hemorrhage, unspecified: Secondary | ICD-10-CM | POA: Diagnosis not present

## 2024-05-23 DIAGNOSIS — D649 Anemia, unspecified: Secondary | ICD-10-CM | POA: Diagnosis not present

## 2024-05-23 DIAGNOSIS — D62 Acute posthemorrhagic anemia: Secondary | ICD-10-CM | POA: Diagnosis not present

## 2024-05-23 LAB — GLUCOSE, CAPILLARY
Glucose-Capillary: 152 mg/dL — ABNORMAL HIGH (ref 70–99)
Glucose-Capillary: 162 mg/dL — ABNORMAL HIGH (ref 70–99)
Glucose-Capillary: 123 mg/dL — ABNORMAL HIGH (ref 70–99)
Glucose-Capillary: 231 mg/dL — ABNORMAL HIGH (ref 70–99)
Glucose-Capillary: 208 mg/dL — ABNORMAL HIGH (ref 70–99)

## 2024-05-23 LAB — CBC
HCT: 23.5 % — ABNORMAL LOW (ref 36.0–46.0)
Hemoglobin: 7.7 g/dL — ABNORMAL LOW (ref 12.0–15.0)
MCH: 28.8 pg (ref 26.0–34.0)
MCHC: 32.8 g/dL (ref 30.0–36.0)
MCV: 88 fL (ref 80.0–100.0)
Platelets: 181 K/uL (ref 150–400)
RBC: 2.67 MIL/uL — ABNORMAL LOW (ref 3.87–5.11)
RDW: 16.2 % — ABNORMAL HIGH (ref 11.5–15.5)
WBC: 12.5 K/uL — ABNORMAL HIGH (ref 4.0–10.5)
nRBC: 0 % (ref 0.0–0.2)

## 2024-05-23 LAB — TYPE AND SCREEN
ABO/RH(D): O POS
Antibody Screen: NEGATIVE
Unit division: 0
Unit division: 0
Unit division: 0
Unit division: 0
Unit division: 0

## 2024-05-23 LAB — BASIC METABOLIC PANEL WITH GFR
Anion gap: 6 (ref 5–15)
BUN: 9 mg/dL (ref 8–23)
CO2: 26 mmol/L (ref 22–32)
Calcium: 8.2 mg/dL — ABNORMAL LOW (ref 8.9–10.3)
Chloride: 106 mmol/L (ref 98–111)
Creatinine, Ser: 0.7 mg/dL (ref 0.44–1.00)
GFR, Estimated: >60 mL/min
Glucose, Bld: 141 mg/dL — ABNORMAL HIGH (ref 70–99)
Potassium: 3.8 mmol/L (ref 3.5–5.1)
Sodium: 138 mmol/L (ref 135–145)

## 2024-05-23 LAB — BPAM RBC
Blood Product Expiration Date: 202601242359
Blood Product Expiration Date: 202601272359
Blood Product Expiration Date: 202601272359
Blood Product Expiration Date: 202601272359
Blood Product Expiration Date: 202601272359
ISSUE DATE / TIME: 202512301032
ISSUE DATE / TIME: 202512301333
ISSUE DATE / TIME: 202512311056
ISSUE DATE / TIME: 202601010237
ISSUE DATE / TIME: 202601020051
Unit Type and Rh: 5100
Unit Type and Rh: 5100
Unit Type and Rh: 5100
Unit Type and Rh: 5100
Unit Type and Rh: 5100

## 2024-05-23 LAB — IRON AND TIBC
Iron: 11 ug/dL — ABNORMAL LOW (ref 28–170)
Saturation Ratios: 5 % — ABNORMAL LOW (ref 10.4–31.8)
TIBC: 204 ug/dL — ABNORMAL LOW (ref 250–450)
UIBC: 194 ug/dL

## 2024-05-23 LAB — FERRITIN: Ferritin: 50 ng/mL (ref 11–307)

## 2024-05-23 LAB — RETICULOCYTES
Immature Retic Fract: 32 % — ABNORMAL HIGH (ref 2.3–15.9)
RBC.: 2.56 MIL/uL — ABNORMAL LOW (ref 3.87–5.11)
Retic Count, Absolute: 71.4 K/uL (ref 19.0–186.0)
Retic Ct Pct: 2.8 % (ref 0.4–3.1)

## 2024-05-23 LAB — HEMOGLOBIN AND HEMATOCRIT, BLOOD
HCT: 22.2 % — ABNORMAL LOW (ref 36.0–46.0)
Hemoglobin: 7.4 g/dL — ABNORMAL LOW (ref 12.0–15.0)

## 2024-05-23 LAB — VITAMIN B12: Vitamin B-12: 554 pg/mL (ref 180–914)

## 2024-05-23 LAB — FOLATE: Folate: 12.4 ng/mL

## 2024-05-23 MED ORDER — HYDROCHLOROTHIAZIDE 12.5 MG PO TABS
12.5000 mg | ORAL_TABLET | Freq: Every day | ORAL | Status: DC
  Administered 2024-05-23 – 2024-05-24 (×2): 12.5 mg via ORAL
  Filled 2024-05-23 (×2): qty 1

## 2024-05-23 MED ORDER — LOSARTAN POTASSIUM-HCTZ 100-12.5 MG PO TABS: 1.0000 | ORAL_TABLET | Freq: Every day | ORAL | Status: DC

## 2024-05-23 MED ORDER — LOSARTAN POTASSIUM 50 MG PO TABS
100.0000 mg | ORAL_TABLET | Freq: Every day | ORAL | Status: DC
  Administered 2024-05-23 – 2024-05-24 (×2): 100 mg via ORAL
  Filled 2024-05-23 (×2): qty 2

## 2024-05-23 MED ORDER — BOOST / RESOURCE BREEZE PO LIQD CUSTOM
1.0000 | Freq: Three times a day (TID) | ORAL | Status: DC
  Administered 2024-05-23 – 2024-05-24 (×2): 1 via ORAL

## 2024-05-23 NOTE — Plan of Care (Signed)
 " Problem: Education: Goal: Knowledge of General Education information will improve Description: Including pain rating scale, medication(s)/side effects and non-pharmacologic comfort measures Outcome: Progressing   Problem: Health Behavior/Discharge Planning: Goal: Ability to manage health-related needs will improve Outcome: Progressing   Problem: Clinical Measurements: Goal: Ability to maintain clinical measurements within normal limits will improve Outcome: Progressing Goal: Will remain free from infection Outcome: Progressing Goal: Diagnostic test results will improve Outcome: Progressing Goal: Respiratory complications will improve Outcome: Progressing Goal: Cardiovascular complication will be avoided Outcome: Progressing   Problem: Activity: Goal: Risk for activity intolerance will decrease Outcome: Progressing   Problem: Nutrition: Goal: Adequate nutrition will be maintained Outcome: Progressing   Problem: Coping: Goal: Level of anxiety will decrease Outcome: Progressing   Problem: Elimination: Goal: Will not experience complications related to bowel motility Outcome: Progressing Goal: Will not experience complications related to urinary retention Outcome: Progressing   Problem: Pain Managment: Goal: General experience of comfort will improve and/or be controlled Outcome: Progressing   Problem: Safety: Goal: Ability to remain free from injury will improve Outcome: Progressing   Problem: Skin Integrity: Goal: Risk for impaired skin integrity will decrease Outcome: Progressing   Problem: Education: Goal: Ability to describe self-care measures that may prevent or decrease complications (Diabetes Survival Skills Education) will improve Outcome: Progressing Goal: Individualized Educational Video(s) Outcome: Progressing   Problem: Coping: Goal: Ability to adjust to condition or change in health will improve Outcome: Progressing   Problem: Fluid  Volume: Goal: Ability to maintain a balanced intake and output will improve Outcome: Progressing   Problem: Health Behavior/Discharge Planning: Goal: Ability to identify and utilize available resources and services will improve Outcome: Progressing Goal: Ability to manage health-related needs will improve Outcome: Progressing   Problem: Metabolic: Goal: Ability to maintain appropriate glucose levels will improve Outcome: Progressing   Problem: Nutritional: Goal: Maintenance of adequate nutrition will improve Outcome: Progressing Goal: Progress toward achieving an optimal weight will improve Outcome: Progressing   Problem: Skin Integrity: Goal: Risk for impaired skin integrity will decrease Outcome: Progressing   Problem: Tissue Perfusion: Goal: Adequacy of tissue perfusion will improve Outcome: Progressing   Problem: Education: Goal: Understanding of CV disease, CV risk reduction, and recovery process will improve Outcome: Progressing Goal: Individualized Educational Video(s) Outcome: Progressing   Problem: Activity: Goal: Ability to return to baseline activity level will improve Outcome: Progressing   Problem: Cardiovascular: Goal: Ability to achieve and maintain adequate cardiovascular perfusion will improve Outcome: Progressing Goal: Vascular access site(s) Level 0-1 will be maintained Outcome: Progressing   Problem: Health Behavior/Discharge Planning: Goal: Ability to safely manage health-related needs after discharge will improve Outcome: Progressing   Problem: Education: Goal: Knowledge of the prescribed therapeutic regimen will improve Outcome: Progressing   Problem: Bowel/Gastric: Goal: Gastrointestinal status for postoperative course will improve Outcome: Progressing   Problem: Cardiac: Goal: Ability to maintain an adequate cardiac output Outcome: Progressing Goal: Will show no evidence of cardiac arrhythmias Outcome: Progressing   Problem:  Nutritional: Goal: Will attain and maintain optimal nutritional status Outcome: Progressing   Problem: Neurological: Goal: Will regain or maintain usual level of consciousness Outcome: Progressing   Problem: Clinical Measurements: Goal: Ability to maintain clinical measurements within normal limits Outcome: Progressing Goal: Postoperative complications will be avoided or minimized Outcome: Progressing   Problem: Respiratory: Goal: Will regain and/or maintain adequate ventilation Outcome: Progressing Goal: Respiratory status will improve Outcome: Progressing   Problem: Skin Integrity: Goal: Demonstrates signs of wound healing without infection Outcome: Progressing   Problem: Urinary  Elimination: Goal: Will remain free from infection Outcome: Progressing Goal: Ability to achieve and maintain adequate urine output Outcome: Progressing   "

## 2024-05-23 NOTE — Evaluation (Signed)
 Occupational Therapy Evaluation Patient Details Name: Casey Johnston MRN: [id number] DOB: Oct 27, 1960 Today's Date: 05/23/2024   History of Present Illness   Patient is a 64 year old female who presented to the ED at [facility] 12/17 for complaints of shortness of breath and hematochezia. Patient was found to have active bleeding with hypotension and tachycardia on 12/19, transferred to ICU for close monitoring. Colonoscopy with ulcer at the hepatic flexure and blood in the entire examined colon.  Underwent right laparoscopic colectomy on 12/31. DM type II, HTN, HLD, OSA on no longer utilizing CPAP, GERD and anxiety.     Clinical Impressions PTA Pt reports being independent with ADLs/IADLs and functional mobility. Pt currently requires up to Mod A for ADL engagement and up to [facility] for functional transfers. Pt is primarily limited by decreased activity tolerance, generalized weakness, decreased endurance, unsteadiness on feet, and decreased knowledge of DME. OT to continue to follow Pt acutely to facilitate progress towards goals. Recommend HHOT services once medically cleared for d/c.      If plan is discharge home, recommend the following:   A little help with walking and/or transfers;A lot of help with bathing/dressing/bathroom;Assistance with cooking/housework;Assist for transportation;Help with stairs or ramp for entrance     Functional Status Assessment   Patient has had a recent decline in their functional status and demonstrates the ability to make significant improvements in function in a reasonable and predictable amount of time.     Equipment Recommendations   BSC/3in1;Other (comment) (rolling walker)     Recommendations for Other Services         Precautions/Restrictions   Precautions Precautions: Fall Recall of Precautions/Restrictions: Intact Restrictions Weight Bearing Restrictions Per Provider Order: No     Mobility Bed Mobility Overal bed  mobility: Needs Assistance Bed Mobility: Supine to Sit, Sit to Supine     Supine to sit: Contact guard Sit to supine: Contact guard assist   General bed mobility comments: CGA for bed mobility this session, Pt expressed that this was better this session as compared to previous attempts    Transfers Overall transfer level: Needs assistance Equipment used: 1 person hand held assist Transfers: Sit to/from Stand Sit to Stand: Min assist           General transfer comment: Min A to rise from bed with BUE use to rise. Min HHA and furniture walking during ambulation to sink. Pt requried CGA for static standing balance.      Balance Overall balance assessment: Needs assistance Sitting-balance support: No upper extremity supported, Feet supported Sitting balance-Leahy Scale: Fair     Standing balance support: Single extremity supported, During functional activity Standing balance-Leahy Scale: Poor Standing balance comment: Requires external support for balance                           ADL either performed or assessed with clinical judgement   ADL Overall ADL's : Needs assistance/impaired Eating/Feeding: Independent   Grooming: Oral care;Contact guard assist;Standing Grooming Details (indicate cue type and reason): BUE propping on sink counter. Trunk flexion. Upper Body Bathing: Contact guard assist;Sitting   Lower Body Bathing: Moderate assistance;Sitting/lateral leans   Upper Body Dressing : Set up   Lower Body Dressing: Moderate assistance   Toilet Transfer: Minimal assistance;Ambulation;BSC/3in1 Statistician Details (indicate cue type and reason): hand held assist Toileting- Clothing Manipulation and Hygiene: Contact guard assist;Sitting/lateral lean  Vision Patient Visual Report: No change from baseline Vision Assessment?: No apparent visual deficits     Perception         Praxis         Pertinent Vitals/Pain Pain  Assessment Pain Assessment: No/denies pain Pain Intervention(s): Monitored during session     Extremity/Trunk Assessment Upper Extremity Assessment Upper Extremity Assessment: Generalized weakness   Lower Extremity Assessment Lower Extremity Assessment: Defer to PT evaluation   Cervical / Trunk Assessment Cervical / Trunk Assessment: Normal   Communication Communication Communication: No apparent difficulties   Cognition Arousal: Alert Behavior During Therapy: WFL for tasks assessed/performed Cognition: No apparent impairments                               Following commands: Intact       Cueing  General Comments   Cueing Techniques: Verbal cues;Tactile cues  HR 91 and SpO2 89% on RA during ADL task standing at sink   Exercises     Shoulder Instructions      Home Living Family/patient expects to be discharged to:: Private residence Living Arrangements: Alone Available Help at Discharge: Family;Neighbor;Friend(s);Available PRN/intermittently Type of Home: House Home Access: Stairs to enter [employer] of Steps: 3 Entrance Stairs-Rails: Can reach both Home Layout: Two level;Able to live on main level with bedroom/bathroom Alternate Level Stairs-Number of Steps: 13 Alternate Level Stairs-Rails: Right Bathroom Shower/Tub: Chief Strategy Officer: Standard     Home Equipment: Hand held shower head          Prior Functioning/Environment Prior Level of Function : Independent/Modified Independent;Driving             Mobility Comments: Ind ADLs Comments: Ind    OT Problem List: Decreased strength;Decreased activity tolerance;Impaired balance (sitting and/or standing);Decreased safety awareness;Decreased knowledge of use of DME or AE;Pain   OT Treatment/Interventions: Therapeutic exercise;Self-care/ADL training;Energy conservation;DME and/or AE instruction;Therapeutic activities;Patient/family education;Balance training       OT Goals(Current goals can be found in the care plan section)   Acute Rehab OT Goals Patient Stated Goal: to get better OT Goal Formulation: With patient Time For Goal Achievement: 06/06/24 Potential to Achieve Goals: Good ADL Goals Pt Will Perform Grooming: with modified independence;standing Pt Will Perform Lower Body Dressing: with contact guard assist;with adaptive equipment;sit to/from stand Pt Will Transfer to Toilet: with modified independence;bedside commode;ambulating Pt/caregiver will Perform Home Exercise Program: Increased strength;Both right and left upper extremity;With written HEP provided   OT Frequency:  Min 2X/week    Co-evaluation              AM-PAC OT 6 Clicks Daily Activity     Outcome Measure Help from another person eating meals?: None Help from another person taking care of personal grooming?: A Little Help from another person toileting, which includes using toliet, bedpan, or urinal?: A Little Help from another person bathing (including washing, rinsing, drying)?: A Lot Help from another person to put on and taking off regular upper body clothing?: A Little Help from another person to put on and taking off regular lower body clothing?: A Lot 6 Click Score: 17   End of Session Nurse Communication: Mobility status  Activity Tolerance: Patient tolerated treatment well Patient left: in bed;with call bell/phone within reach;with bed alarm set  OT Visit Diagnosis: Unsteadiness on feet (R26.81);Muscle weakness (generalized) (M62.81);Pain  Time: 9186-9171 OT Time Calculation (min): 15 min Charges:  OT General Charges $OT Visit: 1 Visit OT Evaluation $OT Eval Low Complexity: 1 Low  Maurilio CROME, OTR/L.  [facility]  Office: [phone]   Maurilio PARAS Jabier Deese 05/23/2024, 10:42 AM

## 2024-05-23 NOTE — Plan of Care (Signed)
" °  Problem: Education: Goal: Knowledge of General Education information will improve Description: Including pain rating scale, medication(s)/side effects and non-pharmacologic comfort measures Outcome: Progressing   Problem: Clinical Measurements: Goal: Ability to maintain clinical measurements within normal limits will improve Outcome: Progressing   Problem: Clinical Measurements: Goal: Will remain free from infection Outcome: Progressing   Problem: Clinical Measurements: Goal: Diagnostic test results will improve Outcome: Progressing   Problem: Clinical Measurements: Goal: Respiratory complications will improve Outcome: Progressing   Problem: Clinical Measurements: Goal: Cardiovascular complication will be avoided Outcome: Progressing   Problem: Coping: Goal: Level of anxiety will decrease Outcome: Progressing   Problem: Nutrition: Goal: Adequate nutrition will be maintained Outcome: Progressing   Problem: Activity: Goal: Risk for activity intolerance will decrease Outcome: Progressing   "

## 2024-05-23 NOTE — Progress Notes (Signed)
 "          [facility]                                                                              [address], is a 64 y.o. female, DOB - 02/14/1961, FMW:[id number] Admit date - 05/06/2024    Outpatient Primary MD for the patient is Casey Jenkins Jansky, FNP  LOS - 17  days  Chief Complaint  Patient presents with   Hematochezia       Brief summary   Patient is a 64 year old female with DM type II, HTN, HLD, OSA on no longer utilizing CPAP, GERD and anxiety who presented to the ED at [facility] 12/17 for complaints of shortness of breath and hematochezia. Of note patient has history of gastric and small bowel AVMs with active GI bleed in the past, recent colonoscopy with diverticular disease.  Patient was found to have active bleeding with hypotension and tachycardia on 12/19, transferred to ICU for close monitoring. Significant Hospital Events:   12/17 presented to [facility], ED with hematochezia and shortness of breath 12/18 CT angio with active contrast extravasation involving the hepatic flexure of the colon consistent with active GI bleed > interventional radiology consulted.  Unfortunately patient remains at [facility] and was unable to undergo IR evaluation.  Patient arrived to [phone] at 1913 12/19 ongoing GI bleed overnight with hypotension and tachycardia.  Underwent embolization of right colic branches 12/20  Softer blood pressure today morning.  Had an episode of medium sized bowel movement with bright red blood.  Hemoglobin down to 6.4.  Given 2 units PRBC.  Surgery and GI consulted.  Repeat CTA done 12/23 colonoscopy. Ulcer found, clips placed. 1 PRBC for hgb dip likely reflectie of old blood seen during scope  12/24 HDS hgb stable txf to progressive 12/25 Developed bleed again overnight, the platelet this is less than third time of the trauma she has not had any more bowel movements 5.9. CTA with continued bleed at hepatic flexure and got repeat embolization by  IR. 12/31: Underwent EGD, normal.  Colonoscopy with ulcer at the hepatic flexure and blood in the entire examined colon.  Underwent right laparoscopic colectomy (Dr Lyndel).  Received 2 units blood.  Assessment & Plan     Acute recurrent lower GI bleeding:  Anemia/ABLA acute secondary to major GI bleed - S/p IR embolization x 2 (12/19 and 12/25).   - S/p colonoscopy with clip hepatic flexure ulcer on 05/12/2024, right colon angiodysplasia. - Persistent bleeding, repeat CT on 12/25 is suggestive of bleeding from the hepatic flexure just below the embolization coils of SMA branches to the hepatic flexure. -Underwent repeat angiogram and embolization of right vasa recta branch of the SMA at the site of right hepatic flexure on 12/25. - 12/31: Underwent EGD: Normal esophagus, stomach, duodenum.   Colonoscopy with diverticulosis in the transverse colon, in the ascending colon and left colon. Solitary ulcer at the hepatic flexure blood in the entire examined colon.  Received 1 unit packed RBCs - 12/31: Underwent laparoscopic right colectomy (Dr Lyndel).  Received 1 unit packed RBCs overnight. - Has received total 27 units of packed RBCs  - Tolerating  soft diet -Increase mobility, OOB, PT evaluation recommended home health PT -Hemoglobin stable this morning 7.7, small BRB in stool overnight.  Surgery following, not unexpected after surgery -Repeat H&H in the evening, obtain anemia profile, will likely benefit from IV iron  if iron  stores low.    DM type II, uncontrolled with hyperglycemia CBG (last 3)  Recent Labs    05/22/24 1620 05/22/24 2038 05/23/24 0632  GLUCAP 158* 163* 152*   Continue sliding scale insulin , Lantus  10 units daily   Hyponatremia -resolved  Essential hypertension - BP now elevated, resume losartan /hydrochlorothiazide  - Continue IV hydralazine  as needed with parameters   Leukocytosis: - Likely reactive.  Continue to follow, no fevers or acute  infectious symptoms.   Anxiety - continue Xanax  as needed   Mild hypocalcemia - normal with hypoalbuminemia, albumin  level 2.1 on 12/21  Vitamin D  deficiency:  -Vitamin D  level <6.0, started vitamin D  50,000 units p.o. weekly, follow with PCP to repeat vitamin D  level after 3 to 6 months.   Moderate protein calorie malnutrition, hypoalbuminemia Nutrition Problem: Inadequate oral intake Etiology: acute illness Signs/Symptoms:  (NPO/CLD)  Interventions: Refer to RD note for recommendations  Obesity class I Estimated body mass index is 32.45 kg/m as calculated from the following:   Height as of this encounter: 5' 5 (1.651 m).   Weight as of this encounter: 88.5 kg.  Code Status: Full code DVT Prophylaxis:  Place and maintain sequential compression device Start: 05/17/24 1741 SCDs Start: 05/06/24 1732   Level of Care: Level of care: Progressive Family Communication: Updated patient Disposition Plan:      Remains inpatient appropriate: PT evaluation recommended home health PT.  If H&H remains stable, no further bleeding, likely home tomorrow if cleared by surgery.   Procedures:    Consultants:   Gastroenterology General Surgery IR Critical care  Antimicrobials:   Anti-infectives (From admission, onward)    Start     Dose/Rate Route Frequency Ordered Stop   05/20/24 1230  cefoTEtan  (CEFOTAN ) 1 g in sodium chloride  0.9 % 100 mL IVPB  Status:  Discontinued        1 g 200 mL/hr over 30 Minutes Intravenous Every 12 hours 05/20/24 1218 05/23/24 0931          Medications  sodium chloride    Intravenous Once   sodium chloride    Intravenous Once   sodium chloride    Intravenous Once   sodium chloride    Intravenous Once   sodium chloride    Intravenous Once   acetaminophen   650 mg Oral Q6H   atorvastatin   20 mg Oral Daily   Chlorhexidine  Gluconate Cloth  6 each Topical Daily   docusate sodium   100 mg Oral BID   feeding supplement  1 Container Oral TID BM   insulin   aspart  0-15 Units Subcutaneous TID WC   insulin  aspart  0-5 Units Subcutaneous QHS   insulin  glargine  10 Units Subcutaneous Daily   multivitamin with minerals  1 tablet Oral Daily   pantoprazole   40 mg Oral BID   sodium chloride  flush  3 mL Intravenous Q12H   Vitamin D  (Ergocalciferol )  50,000 Units Oral Q Mon      Subjective:   [facility] was seen and examined today.  No acute complaints, overnight had small BRB in stool.  Tolerating soft diet.  No abdominal pain, nausea or vomiting.  Objective:   Vitals:   05/22/24 1900 05/22/24 2330 05/23/24 0518 05/23/24 0924  BP: (!) 167/59 (!) 146/54 (!) 137/50 ROLLEN)  190/74  Pulse: 86 75 67 99  Resp: 18 14 14 20   Temp: 99.1 F (37.3 C) 97.9 F (36.6 C) 97.8 F (36.6 C) 98.9 F (37.2 C)  TempSrc: Oral Oral Oral Oral  SpO2: 92% 97% 97% 96%  Weight:      Height:        Intake/Output Summary (Last 24 hours) at 05/23/2024 1057 Last data filed at 05/23/2024 1015 Gross per 24 hour  Intake 473.67 ml  Output 1000 ml  Net -526.33 ml     Wt Readings from Last 3 Encounters:  05/12/24 88.5 kg  04/21/24 88.5 kg  04/05/24 90.2 kg    Physical Exam General: Alert and oriented x 3, NAD Cardiovascular: S1 S2 clear, RRR.  Respiratory: CTAB, no wheezing Gastrointestinal: Soft, nontender, nondistended, NBS Ext: no pedal edema bilaterally Neuro: no new deficits Psych: Normal affect    Data Reviewed:  I have personally reviewed following labs    CBC Lab Results  Component Value Date   WBC 12.5 (H) 05/23/2024   RBC 2.67 (L) 05/23/2024   HGB 7.7 (L) 05/23/2024   HCT 23.5 (L) 05/23/2024   MCV 88.0 05/23/2024   MCH 28.8 05/23/2024   PLT 181 05/23/2024   MCHC 32.8 05/23/2024   RDW 16.2 (H) 05/23/2024   LYMPHSABS 4.1 (H) 05/06/2024   MONOABS 0.6 05/06/2024   EOSABS 0.1 05/06/2024   BASOSABS 0.1 05/06/2024     Last metabolic panel Lab Results  Component Value Date   NA 138 05/23/2024   K 3.8 05/23/2024   CL 106 05/23/2024    CO2 26 05/23/2024   BUN 9 05/23/2024   CREATININE 0.70 05/23/2024   GLUCOSE 141 (H) 05/23/2024   GFRNONAA >60 05/23/2024   GFRAA >60 02/09/2020   CALCIUM  8.2 (L) 05/23/2024   PHOS 3.7 05/19/2024   PROT 4.2 (L) 05/22/2024   ALBUMIN  2.3 (L) 05/22/2024   BILITOT 0.2 05/22/2024   ALKPHOS 56 05/22/2024   AST 24 05/22/2024   ALT 16 05/22/2024   ANIONGAP 6 05/23/2024    CBG (last 3)  Recent Labs    05/22/24 1620 05/22/24 2038 05/23/24 0632  GLUCAP 158* 163* 152*      Coagulation Profile: No results for input(s): INR, PROTIME in the last 168 hours.   Radiology Studies: I have personally reviewed the imaging studies  No results found.     Nydia Distance M.D. [facility] 05/23/2024, 10:57 AM  Available via Epic secure chat 7am-7pm After 7 pm, please refer to night coverage provider listed on amion.    "

## 2024-05-23 NOTE — Progress Notes (Signed)
 Patient ID: [facility], female   DOB: 02-29-1961, 64 y.o.   MRN: [id number] 3 Days Post-Op    Subjective: + flatus and BM overnight.  BM with some bright red blood, but minimal.  Cbc pending this am.  Tolerating D3 diet.   ROS negative except as listed above. Objective: Vital signs in last 24 hours: Temp:  [97.8 F (36.6 C)-99.3 F (37.4 C)] 97.8 F (36.6 C) (01/03 0518) Pulse Rate:  [67-89] 67 (01/03 0518) Resp:  [14-18] 14 (01/03 0518) BP: (137-167)/(47-59) 137/50 (01/03 0518) SpO2:  [90 %-98 %] 97 % (01/03 0518) Last BM Date : 05/22/24  Intake/Output from previous day: 01/02 0701 - 01/03 0700 In: 493.7 [P.O.:360; IV Piggyback:133.7] Out: 1000 [Urine:1000] Intake/Output this shift: No intake/output data recorded.  PE: Gen: NAD Abd: soft, minimally tender, incisions c/d/I with dermabond, obese, but ND  Lab Results: CBC  Recent Labs    05/21/24 0617 05/21/24 1215 05/22/24 0509 05/22/24 0936 05/22/24 1852 05/22/24 2343  WBC 16.8*  --  15.1*  --   --   --   HGB 8.1*   < > 8.2*   < > 7.9* 7.2*  HCT 23.8*   < > 24.4*   < > 23.7* 20.8*  PLT 151  --  133*  --   --   --    < > = values in this interval not displayed.   BMET Recent Labs    05/21/24 0617 05/22/24 0509  NA 139 135  K 4.9 4.4  CL 106 104  CO2 26 23  GLUCOSE 192* 164*  BUN 15 13  CREATININE 0.90 0.95  CALCIUM  7.9* 7.6*   PT/INR No results for input(s): LABPROT, INR in the last 72 hours. ABG No results for input(s): PHART, HCO3 in the last 72 hours.  Invalid input(s): PCO2, PO2  Studies/Results: No results found.  Anti-infectives: Anti-infectives (From admission, onward)    Start     Dose/Rate Route Frequency Ordered Stop   05/20/24 1230  cefoTEtan  (CEFOTAN ) 1 g in sodium chloride  0.9 % 100 mL IVPB        1 g 200 mL/hr over 30 Minutes Intravenous Every 12 hours 05/20/24 1218         Assessment/Plan: LGIB, s/p right colon IR embolization and colonoscopy 05/12/2024 -  1 cm ulcer at hepatic flexure status post 2 hemoclips. Repeat CTA 12/25 AM with persistent bleed and underwent repeat AE.   POD 3, S/P laparoscopic R colectomy 12/31, Dr. Lyndel - Hgb 7.2 last night.  CBC pending today.  Will need to monitor given a small bit of BRB in her stool overnight.  Suspect this is not unexpected after surgery, but will monitor.  Vitals are stable and doubt ongoing acute bleeding -DC foley -mobilize -place on soft diet from D3 -patient doesn't like Ensure.  Will try Breeze   FEN - soft, Breeze VTE - SCDs; hold LMWH for bleeding issues ID - cefotetan , will DC this given no needs post op   Dispo - 4E, monitor hgb  LOS: 17 days    Burnard FORBES Banter, PA-C  [facility] Use [email or web address] to contact on call provider  05/23/2024

## 2024-05-23 NOTE — Progress Notes (Addendum)
 Mobility Specialist: Progress Note   05/23/24 1200  Mobility  Activity Ambulated with assistance  Level of Assistance Contact guard assist, steadying assist  Assistive Device Other (Comment) (HHA/frequent use of hallway handrails)  Distance Ambulated (ft) 100 ft  Activity Response Tolerated well  Mobility Referral Yes  Mobility visit 1 Mobility  Mobility Specialist Start Time (ACUTE ONLY) [id number]  Mobility Specialist Stop Time (ACUTE ONLY) 0910  Mobility Specialist Time Calculation (min) (ACUTE ONLY) 7 min    Pt received in bed, agreeable to mobility session. SV for bed mobility. CGA for STS and ambulation. Initially using HHA but switched to using hallway handrails for remainder of ambulation. Stated her legs felt weak which is why she is needing a little more support. Returned to room, fatigued by EOS. SpO2 WFL on RA. Left on EOB with all needs met, call bell in reach.   Ileana Lute Mobility Specialist Please contact via SecureChat or Rehab office at [phone]

## 2024-05-24 ENCOUNTER — Other Ambulatory Visit (HOSPITAL_COMMUNITY): Payer: Self-pay

## 2024-05-24 DIAGNOSIS — K922 Gastrointestinal hemorrhage, unspecified: Secondary | ICD-10-CM | POA: Diagnosis not present

## 2024-05-24 DIAGNOSIS — D649 Anemia, unspecified: Secondary | ICD-10-CM | POA: Diagnosis not present

## 2024-05-24 LAB — BASIC METABOLIC PANEL WITH GFR
Anion gap: 5 (ref 5–15)
BUN: 7 mg/dL — ABNORMAL LOW (ref 8–23)
CO2: 26 mmol/L (ref 22–32)
Calcium: 8.1 mg/dL — ABNORMAL LOW (ref 8.9–10.3)
Chloride: 108 mmol/L (ref 98–111)
Creatinine, Ser: 0.63 mg/dL (ref 0.44–1.00)
GFR, Estimated: >60 mL/min
Glucose, Bld: 129 mg/dL — ABNORMAL HIGH (ref 70–99)
Potassium: 3.8 mmol/L (ref 3.5–5.1)
Sodium: 139 mmol/L (ref 135–145)

## 2024-05-24 LAB — CBC
HCT: 22.2 % — ABNORMAL LOW (ref 36.0–46.0)
Hemoglobin: 7.2 g/dL — ABNORMAL LOW (ref 12.0–15.0)
MCH: 28.5 pg (ref 26.0–34.0)
MCHC: 32.4 g/dL (ref 30.0–36.0)
MCV: 87.7 fL (ref 80.0–100.0)
Platelets: 216 K/uL (ref 150–400)
RBC: 2.53 MIL/uL — ABNORMAL LOW (ref 3.87–5.11)
RDW: 15.9 % — ABNORMAL HIGH (ref 11.5–15.5)
WBC: 10.5 K/uL (ref 4.0–10.5)
nRBC: 0 % (ref 0.0–0.2)

## 2024-05-24 LAB — GLUCOSE, CAPILLARY
Glucose-Capillary: 127 mg/dL — ABNORMAL HIGH (ref 70–99)
Glucose-Capillary: 193 mg/dL — ABNORMAL HIGH (ref 70–99)

## 2024-05-24 MED ORDER — FUROSEMIDE 10 MG/ML IJ SOLN
20.0000 mg | Freq: Once | INTRAMUSCULAR | Status: AC
  Administered 2024-05-24: 20 mg via INTRAVENOUS
  Filled 2024-05-24: qty 2

## 2024-05-24 MED ORDER — FERROUS SULFATE 325 (65 FE) MG PO TABS
325.0000 mg | ORAL_TABLET | Freq: Every day | ORAL | 3 refills | Status: AC
  Filled 2024-05-24: qty 30, 30d supply, fill #0

## 2024-05-24 MED ORDER — PROCHLORPERAZINE MALEATE 10 MG PO TABS
10.0000 mg | ORAL_TABLET | Freq: Four times a day (QID) | ORAL | 0 refills | Status: AC | PRN
  Filled 2024-05-24: qty 30, 8d supply, fill #0

## 2024-05-24 MED ORDER — VITAMIN D (ERGOCALCIFEROL) 1.25 MG (50000 UNIT) PO CAPS
50000.0000 [IU] | ORAL_CAPSULE | ORAL | 4 refills | Status: AC
  Filled 2024-05-24: qty 5, 35d supply, fill #0

## 2024-05-24 MED ORDER — METHOCARBAMOL 500 MG PO TABS
500.0000 mg | ORAL_TABLET | Freq: Three times a day (TID) | ORAL | 0 refills | Status: AC | PRN
  Filled 2024-05-24: qty 30, 10d supply, fill #0

## 2024-05-24 MED ORDER — OXYCODONE HCL 5 MG PO TABS
5.0000 mg | ORAL_TABLET | ORAL | 0 refills | Status: AC | PRN
  Filled 2024-05-24: qty 25, 5d supply, fill #0

## 2024-05-24 MED ORDER — ACETAMINOPHEN 500 MG PO TABS: 1000.0000 mg | ORAL_TABLET | Freq: Four times a day (QID) | ORAL | Status: AC | PRN

## 2024-05-24 MED ORDER — POLYETHYLENE GLYCOL 3350 17 GM/SCOOP PO POWD
17.0000 g | Freq: Every day | ORAL | 0 refills | Status: AC | PRN
  Filled 2024-05-24: qty 238, 14d supply, fill #0

## 2024-05-24 MED ORDER — SODIUM CHLORIDE 0.9 % IV SOLN
300.0000 mg | Freq: Once | INTRAVENOUS | Status: AC
  Administered 2024-05-24: 300 mg via INTRAVENOUS
  Filled 2024-05-24: qty 15

## 2024-05-24 MED ORDER — CLONIDINE HCL 0.1 MG PO TABS: 0.1000 mg | ORAL_TABLET | Freq: Every day | ORAL | Status: AC

## 2024-05-24 NOTE — Discharge Summary (Signed)
 " Physician Discharge Summary   Patient: Casey Johnston MRN: [id number] DOB: 1960-12-26  Admit date:     05/06/2024  Discharge date: 05/24/2024  Discharge Physician: Nydia Distance, MD    PCP: Teresa Jenkins Jansky, FNP   Recommendations at discharge:   Outpatient follow-up with the surgery and GI Repeat vitamin D  level in 3 months  Discharge Diagnoses:    Lower recurrent major GI bleed   ABLA (acute blood loss anemia)   Angiectasia of gastrointestinal tract   Iron  deficiency anemia due to chronic blood loss   Hyperkalemia   AKI (acute kidney injury)   Hypotension due to hypovolemia    Diabetes mellitus type 2    Obesity class I Vitamin D  deficiency Moderate protein calorie malnutrition   Hospital Course:  Patient is a 64 year old female with DM type II, HTN, HLD, OSA on no longer utilizing CPAP, GERD and anxiety who presented to the ED at [facility] 12/17 for complaints of shortness of breath and hematochezia. Of note patient has history of gastric and small bowel AVMs with active GI bleed in the past, recent colonoscopy with diverticular disease.  Patient was found to have active bleeding with hypotension and tachycardia on 12/19, transferred to ICU for close monitoring. Significant Hospital Events:    12/17 presented to [name] ED with hematochezia and shortness of breath 12/18 CT angio with active contrast extravasation involving the hepatic flexure of the colon consistent with active GI bleed > interventional radiology consulted.  Unfortunately patient remains at [facility] and was unable to undergo IR evaluation.  Patient arrived to [phone] at 1913 12/19 ongoing GI bleed overnight with hypotension and tachycardia.  Underwent embolization of right colic branches 12/20  Softer BP, episode of medium sized bowel movement with bright red blood.  Hemoglobin down to 6.4.  Given 2 units PRBC.  Surgery and GI consulted.  Repeat CTA done 12/23 colonoscopy. Ulcer found, clips placed. 1 PRBC  for hgb dip likely reflectie of old blood seen during scope  12/24 hemoglobin stable 12/25 Developed bleed again overnight, the platelet this is less than third time of the trauma she has not had any more bowel movements 5.9. CTA with continued bleed at hepatic flexure and got repeat embolization by IR. 12/31: Underwent EGD, normal.  Colonoscopy with ulcer at the hepatic flexure and blood in the entire examined colon.  Underwent right laparoscopic colectomy (Dr Lyndel).    Assessment and Plan:   Acute recurrent lower GI bleeding:  Anemia/ABLA acute secondary to major GI bleed - S/p IR embolization x 2 (12/19 and 12/25).   - S/p colonoscopy with clip hepatic flexure ulcer on 05/12/2024, right colon angiodysplasia. - Persistent bleeding, repeat CT on 12/25 is suggestive of bleeding from the hepatic flexure just below the embolization coils of SMA branches to the hepatic flexure. -12/25: underwent repeat angiogram and embolization of right vasa recta branch of the SMA at the site of right hepatic flexure. - 12/31: Underwent EGD: Normal esophagus, stomach, duodenum.   Colonoscopy with diverticulosis in the transverse colon, in the ascending colon and left colon. Solitary ulcer at the hepatic flexure blood in the entire examined colon.  Received 1 unit packed RBCs - 12/31: Underwent laparoscopic right colectomy (Dr Lyndel).  Received 1 unit packed RBCs overnight. - Has received total 27 units of packed RBCs  - Tolerating soft diet -Cleared by surgery to discharge home.  Anemia profile showed low iron  stores, ferritin 50 iron  11, percent saturation ratio 5, received  IV Feraheme x 1 prior to discharge.  Received 1 dose of 20 mg IV Lasix .      DM type II, uncontrolled with hyperglycemia - CBGs now stable, resume outpatient regimen.     Hyponatremia -resolved   Essential hypertension - Resumed clonidine , losartan /HCTZ    Leukocytosis: - Likely reactive, resolved   Anxiety -  Stable, no acute issues   Mild hypocalcemia - normal with hypoalbuminemia, albumin  level 2.1 on 12/21   Vitamin D  deficiency:  -Vitamin D  level <6.0, started vitamin D  50,000 units p.o. weekly, follow with PCP to repeat vitamin D  level after 3 to 6 months.   Moderate protein calorie malnutrition, hypoalbuminemia Nutrition Problem: Inadequate oral intake Etiology: acute illness  Obesity class I Estimated body mass index is 32.45 kg/m as calculated from the following:   Height as of this encounter: 5' 5 (1.651 m).   Weight as of this encounter: 88.5 kg.      Pain control - [place]  Controlled Substance Reporting System database was reviewed. and patient was instructed, not to drive, operate heavy machinery, perform activities at heights, swimming or participation in water  activities or provide baby-sitting services while on Pain, Sleep and Anxiety Medications; until their outpatient Physician has advised to do so again. Also recommended to not to take more than prescribed Pain, Sleep and Anxiety Medications.  Consultants: IR, surgery, gastroenterology Procedures performed: Multiple procedures as above Disposition: Home Diet recommendation: Soft diet  DISCHARGE MEDICATION: Allergies as of 05/24/2024       Reactions   Januvia  [sitagliptin ] Other (See Comments)   Caused pancreatitis   Amlodipine  Swelling        Medication List     TAKE these medications    acetaminophen  500 MG tablet Commonly known as: TYLENOL  Take 2 tablets (1,000 mg total) by mouth every 6 (six) hours as needed.   atorvastatin  20 MG tablet Commonly known as: LIPITOR TAKE 1 Tablet BY MOUTH ONCE EVERY DAY What changed:  how much to take how to take this when to take this   cloNIDine  0.1 MG tablet Commonly known as: CATAPRES  Take 1 tablet (0.1 mg total) by mouth daily.   ferrous sulfate  325 (65 FE) MG tablet Commonly known as: FeroSul Take 1 tablet (325 mg total) by mouth daily with  breakfast. What changed:  medication strength when to take this   glipiZIDE  5 MG 24 hr tablet Commonly known as: GLUCOTROL  XL Take 1 tablet (5 mg total) by mouth daily with breakfast.   Linzess  72 MCG capsule Generic drug: linaclotide  Take 72 mcg by mouth daily as needed (constipation).   losartan -hydrochlorothiazide  100-12.5 MG tablet Commonly known as: HYZAAR Take 1 tablet by mouth daily.   metFORMIN  1000 MG tablet Commonly known as: GLUCOPHAGE  Take 1 tablet (1,000 mg total) by mouth 2 (two) times daily with a meal.   methocarbamol  500 MG tablet Commonly known as: ROBAXIN  Take 1 tablet (500 mg total) by mouth every 8 (eight) hours as needed for muscle spasms.   oxyCODONE  5 MG immediate release tablet Commonly known as: Oxy IR/ROXICODONE  Take 1 tablet (5 mg total) by mouth every 4 (four) hours as needed.   pantoprazole  40 MG tablet Commonly known as: Protonix  Take 1 tablet (40 mg total) by mouth 2 (two) times daily before a meal.   polyethylene glycol powder 17 GM/SCOOP powder Commonly known as: GLYCOLAX /MIRALAX  Dissolve 1 capful (17g) in 4-8 ounces of liquid and take by mouth daily.   prochlorperazine  10 MG tablet Commonly known  as: COMPAZINE  Take 1 tablet (10 mg total) by mouth every 6 (six) hours as needed for nausea or vomiting.   Trulicity 0.75 MG/0.5ML Soaj Generic drug: Dulaglutide Inject 0.75 mg into the skin every Monday.   Vitamin D  (Ergocalciferol ) 1.25 MG (50000 UNIT) Caps capsule Commonly known as: DRISDOL  Take 1 capsule (50,000 Units total) by mouth every Monday. Start taking on: May 25, 2024               Discharge Care Instructions  (From admission, onward)           Start     Ordered   05/24/24 0000  If the dressing is still on your incision site when you go home, remove it on the third day after your surgery date. Remove dressing if it begins to fall off, or if it is dirty or damaged before the third day.        05/24/24 1154             Follow-up Information     Stechschulte, Deward PARAS, MD Follow up in 3 week(s).   Specialty: Surgery Why: Office will call you with a follow up appointment, If you don't hear from the office, please call, Arrive 30 minutes prior to your appointment time, Please bring your insurance card and photo ID Contact information: [address] [place] [address] [phone]         Teresa Jenkins Jansky, FNP. Schedule an appointment as soon as possible for a visit in 2 week(s).   Specialty: Family Medicine Why: for hospital follow-up Contact information: [address] [place] [address] [phone]         Cindie Carlin POUR, DO. Schedule an appointment as soon as possible for a visit in 2 week(s).   Specialty: Gastroenterology Why: for hospital follow-up Contact information: [address] [place] [address] [place] [address] [phone]                Discharge Exam: Fredricka Weights   05/06/24 1132 05/12/24 0935 05/24/24 0838  Weight: 88.5 kg 88.5 kg 99.4 kg   S: No acute complaints, no further bleeding, cleared by general surgery to discharge home.  BP (!) 133/55 (BP Location: Right Arm)   Pulse 73   Temp 98.4 F (36.9 C) (Oral)   Resp 18   Ht 5' 5 (1.651 m)   Wt 99.4 kg   SpO2 94%   BMI 36.46 kg/m   Physical Exam General: Alert and oriented x 3, NAD Cardiovascular: S1 S2 clear, RRR.  Respiratory: CTAB, no wheezing, rales or rhonchi Gastrointestinal: Soft, nontender, nondistended, NBS Ext: no pedal edema bilaterally Neuro: no new deficits Psych: Normal affect    Condition at discharge: fair  The results of significant diagnostics from this hospitalization (including imaging, microbiology, ancillary and laboratory) are listed below for reference.   Imaging Studies: IR EMBO ART  VEN HEMORR LYMPH EXTRAV  INC GUIDE ROADMAPPING Result Date: 05/14/2024 INDICATION: Recurrent acute right colon lower GI bleeding by CTA, associated  blood-loss anemia, status post prior angio embolization and endoscopic clipping EXAM: ULTRASOUND GUIDANCE FOR VASCULAR ACCESS SMA, RIGHT COLIC, MARGINAL BRANCH, AND 2 PERIPHERAL SEPARATE VASA RECTA BRANCHES CATHETERIZATIONS, ANGIOGRAMS, AND ADDITIONAL PERIPHERAL MICRO COIL EMBOLIZATION MEDICATIONS: 1% lidocaine  local. ANESTHESIA/SEDATION: Moderate (conscious) sedation was employed during this procedure. A total of Versed  1.5 mg and Fentanyl  50 mcg was administered intravenously by the radiology nurse. Total intra-service moderate Sedation Time: 60 minutes. The patient's level of  consciousness and vital signs were monitored continuously by radiology nursing throughout the procedure under my direct supervision. CONTRAST:  85 cc omni 300 FLUOROSCOPY: Radiation Exposure Index (as provided by the fluoroscopic device): 2,418 mGy Kerma COMPLICATIONS: None immediate. PROCEDURE: Informed consent was obtained from the patient following explanation of the procedure, risks, benefits and alternatives. The patient understands, agrees and consents for the procedure. All questions were addressed. A time out was performed prior to the initiation of the procedure. Maximal barrier sterile technique utilized including caps, mask, sterile gowns, sterile gloves, large sterile drape, hand hygiene, and Betadine  prep. Previous imaging reviewed. Under sterile conditions and local anesthesia, ultrasound micropuncture access performed of the patent right common femoral artery. Images obtained for documentation of the patent right common femoral artery. Five French sheath inserted over a Bentson guidewire. C2 catheter advanced to select the SMA. Repeat SMA angiogram performed. SMA: SMA origin remains widely patent including the colic and jejunal branches. Initially no active bleeding demonstrated in the previously embolized right hepatic flexure distribution. Progreat single angle microcatheter was advanced through the C2 catheter into the SMA  trunk. Fathom guidewire again utilized to select the right colic branch. Selective right colic angiogram performed. Right colic: Right colic remains patent leading into the right marginal artery. Peripheral Vasa recta branches to the hepatic flexure remain patent. Initially no active bleeding demonstrated. Microcatheter retracted and utilized to select the origin of 1 of the previously embolized 3 branches. Right vasa recta branch (1): Injection of the previously embolized branch demonstrates no active bleeding. Microcatheter retracted and utilized to select the origin of a second embolized branch. Right Vasa recta branch (2): This branch has been previously embolized however with delayed imaging, there is a rounded vascular blush distal to the coils which correlates with the CTA earlier today. Additional micro embolization: The origin of this branch was successfully embolized with the deployment of 2 additional coils (first coil was a 2 mm x 2 cm Terumo Azur coil followed by a 1 mm x 2 cm Ruby low-profile coil. This completely embolized the branch back to its origin off of the marginal artery. Final completion angiogram no longer demonstrates the abnormal vascularity/blushing. Additional angiography within the marginal branch demonstrated no additional abnormal vascularity with preservation of the marginal artery of [place] to the right colon. At this point the microcatheter was removed. C2 catheter also removed. Hemostasis obtained at the right CFA puncture site with the CELT device. Patient tolerated the procedure well.  No immediate complication. IMPRESSION: Additional successful peripheral micro coil embolization of a right Vasa recta branch off of the SMA at the site of right hepatic flexure mild arterial oozing demonstrated by CTA earlier today. Electronically Signed   By: CHRISTELLA.  Shick M.D.   On: 05/14/2024 17:03   IR Angiogram Visceral Selective Result Date: 05/14/2024 INDICATION: Recurrent acute right  colon lower GI bleeding by CTA, associated blood-loss anemia, status post prior angio embolization and endoscopic clipping EXAM: ULTRASOUND GUIDANCE FOR VASCULAR ACCESS SMA, RIGHT COLIC, MARGINAL BRANCH, AND 2 PERIPHERAL SEPARATE VASA RECTA BRANCHES CATHETERIZATIONS, ANGIOGRAMS, AND ADDITIONAL PERIPHERAL MICRO COIL EMBOLIZATION MEDICATIONS: 1% lidocaine  local. ANESTHESIA/SEDATION: Moderate (conscious) sedation was employed during this procedure. A total of Versed  1.5 mg and Fentanyl  50 mcg was administered intravenously by the radiology nurse. Total intra-service moderate Sedation Time: 60 minutes. The patient's level of consciousness and vital signs were monitored continuously by radiology nursing throughout the procedure under my direct supervision. CONTRAST:  85 cc omni 300 FLUOROSCOPY: Radiation Exposure Index (as  provided by the fluoroscopic device): 2,418 mGy Kerma COMPLICATIONS: None immediate. PROCEDURE: Informed consent was obtained from the patient following explanation of the procedure, risks, benefits and alternatives. The patient understands, agrees and consents for the procedure. All questions were addressed. A time out was performed prior to the initiation of the procedure. Maximal barrier sterile technique utilized including caps, mask, sterile gowns, sterile gloves, large sterile drape, hand hygiene, and Betadine  prep. Previous imaging reviewed. Under sterile conditions and local anesthesia, ultrasound micropuncture access performed of the patent right common femoral artery. Images obtained for documentation of the patent right common femoral artery. Five French sheath inserted over a Bentson guidewire. C2 catheter advanced to select the SMA. Repeat SMA angiogram performed. SMA: SMA origin remains widely patent including the colic and jejunal branches. Initially no active bleeding demonstrated in the previously embolized right hepatic flexure distribution. Progreat single angle microcatheter was  advanced through the C2 catheter into the SMA trunk. Fathom guidewire again utilized to select the right colic branch. Selective right colic angiogram performed. Right colic: Right colic remains patent leading into the right marginal artery. Peripheral Vasa recta branches to the hepatic flexure remain patent. Initially no active bleeding demonstrated. Microcatheter retracted and utilized to select the origin of 1 of the previously embolized 3 branches. Right vasa recta branch (1): Injection of the previously embolized branch demonstrates no active bleeding. Microcatheter retracted and utilized to select the origin of a second embolized branch. Right Vasa recta branch (2): This branch has been previously embolized however with delayed imaging, there is a rounded vascular blush distal to the coils which correlates with the CTA earlier today. Additional micro embolization: The origin of this branch was successfully embolized with the deployment of 2 additional coils (first coil was a 2 mm x 2 cm Terumo Azur coil followed by a 1 mm x 2 cm Ruby low-profile coil. This completely embolized the branch back to its origin off of the marginal artery. Final completion angiogram no longer demonstrates the abnormal vascularity/blushing. Additional angiography within the marginal branch demonstrated no additional abnormal vascularity with preservation of the marginal artery of [place] to the right colon. At this point the microcatheter was removed. C2 catheter also removed. Hemostasis obtained at the right CFA puncture site with the CELT device. Patient tolerated the procedure well.  No immediate complication. IMPRESSION: Additional successful peripheral micro coil embolization of a right Vasa recta branch off of the SMA at the site of right hepatic flexure mild arterial oozing demonstrated by CTA earlier today. Electronically Signed   By: CHRISTELLA.  Shick M.D.   On: 05/14/2024 17:03   IR Angiogram Selective Each Additional  Vessel Result Date: 05/14/2024 INDICATION: Recurrent acute right colon lower GI bleeding by CTA, associated blood-loss anemia, status post prior angio embolization and endoscopic clipping EXAM: ULTRASOUND GUIDANCE FOR VASCULAR ACCESS SMA, RIGHT COLIC, MARGINAL BRANCH, AND 2 PERIPHERAL SEPARATE VASA RECTA BRANCHES CATHETERIZATIONS, ANGIOGRAMS, AND ADDITIONAL PERIPHERAL MICRO COIL EMBOLIZATION MEDICATIONS: 1% lidocaine  local. ANESTHESIA/SEDATION: Moderate (conscious) sedation was employed during this procedure. A total of Versed  1.5 mg and Fentanyl  50 mcg was administered intravenously by the radiology nurse. Total intra-service moderate Sedation Time: 60 minutes. The patient's level of consciousness and vital signs were monitored continuously by radiology nursing throughout the procedure under my direct supervision. CONTRAST:  85 cc omni 300 FLUOROSCOPY: Radiation Exposure Index (as provided by the fluoroscopic device): 2,418 mGy Kerma COMPLICATIONS: None immediate. PROCEDURE: Informed consent was obtained from the patient following explanation of the procedure, risks, benefits  and alternatives. The patient understands, agrees and consents for the procedure. All questions were addressed. A time out was performed prior to the initiation of the procedure. Maximal barrier sterile technique utilized including caps, mask, sterile gowns, sterile gloves, large sterile drape, hand hygiene, and Betadine  prep. Previous imaging reviewed. Under sterile conditions and local anesthesia, ultrasound micropuncture access performed of the patent right common femoral artery. Images obtained for documentation of the patent right common femoral artery. Five French sheath inserted over a Bentson guidewire. C2 catheter advanced to select the SMA. Repeat SMA angiogram performed. SMA: SMA origin remains widely patent including the colic and jejunal branches. Initially no active bleeding demonstrated in the previously embolized right  hepatic flexure distribution. Progreat single angle microcatheter was advanced through the C2 catheter into the SMA trunk. Fathom guidewire again utilized to select the right colic branch. Selective right colic angiogram performed. Right colic: Right colic remains patent leading into the right marginal artery. Peripheral Vasa recta branches to the hepatic flexure remain patent. Initially no active bleeding demonstrated. Microcatheter retracted and utilized to select the origin of 1 of the previously embolized 3 branches. Right vasa recta branch (1): Injection of the previously embolized branch demonstrates no active bleeding. Microcatheter retracted and utilized to select the origin of a second embolized branch. Right Vasa recta branch (2): This branch has been previously embolized however with delayed imaging, there is a rounded vascular blush distal to the coils which correlates with the CTA earlier today. Additional micro embolization: The origin of this branch was successfully embolized with the deployment of 2 additional coils (first coil was a 2 mm x 2 cm Terumo Azur coil followed by a 1 mm x 2 cm Ruby low-profile coil. This completely embolized the branch back to its origin off of the marginal artery. Final completion angiogram no longer demonstrates the abnormal vascularity/blushing. Additional angiography within the marginal branch demonstrated no additional abnormal vascularity with preservation of the marginal artery of [place] to the right colon. At this point the microcatheter was removed. C2 catheter also removed. Hemostasis obtained at the right CFA puncture site with the CELT device. Patient tolerated the procedure well.  No immediate complication. IMPRESSION: Additional successful peripheral micro coil embolization of a right Vasa recta branch off of the SMA at the site of right hepatic flexure mild arterial oozing demonstrated by CTA earlier today. Electronically Signed   By: CHRISTELLA.  Shick M.D.   On:  05/14/2024 17:03   IR Angiogram Selective Each Additional Vessel Result Date: 05/14/2024 INDICATION: Recurrent acute right colon lower GI bleeding by CTA, associated blood-loss anemia, status post prior angio embolization and endoscopic clipping EXAM: ULTRASOUND GUIDANCE FOR VASCULAR ACCESS SMA, RIGHT COLIC, MARGINAL BRANCH, AND 2 PERIPHERAL SEPARATE VASA RECTA BRANCHES CATHETERIZATIONS, ANGIOGRAMS, AND ADDITIONAL PERIPHERAL MICRO COIL EMBOLIZATION MEDICATIONS: 1% lidocaine  local. ANESTHESIA/SEDATION: Moderate (conscious) sedation was employed during this procedure. A total of Versed  1.5 mg and Fentanyl  50 mcg was administered intravenously by the radiology nurse. Total intra-service moderate Sedation Time: 60 minutes. The patient's level of consciousness and vital signs were monitored continuously by radiology nursing throughout the procedure under my direct supervision. CONTRAST:  85 cc omni 300 FLUOROSCOPY: Radiation Exposure Index (as provided by the fluoroscopic device): 2,418 mGy Kerma COMPLICATIONS: None immediate. PROCEDURE: Informed consent was obtained from the patient following explanation of the procedure, risks, benefits and alternatives. The patient understands, agrees and consents for the procedure. All questions were addressed. A time out was performed prior to the initiation of the  procedure. Maximal barrier sterile technique utilized including caps, mask, sterile gowns, sterile gloves, large sterile drape, hand hygiene, and Betadine  prep. Previous imaging reviewed. Under sterile conditions and local anesthesia, ultrasound micropuncture access performed of the patent right common femoral artery. Images obtained for documentation of the patent right common femoral artery. Five French sheath inserted over a Bentson guidewire. C2 catheter advanced to select the SMA. Repeat SMA angiogram performed. SMA: SMA origin remains widely patent including the colic and jejunal branches. Initially no active  bleeding demonstrated in the previously embolized right hepatic flexure distribution. Progreat single angle microcatheter was advanced through the C2 catheter into the SMA trunk. Fathom guidewire again utilized to select the right colic branch. Selective right colic angiogram performed. Right colic: Right colic remains patent leading into the right marginal artery. Peripheral Vasa recta branches to the hepatic flexure remain patent. Initially no active bleeding demonstrated. Microcatheter retracted and utilized to select the origin of 1 of the previously embolized 3 branches. Right vasa recta branch (1): Injection of the previously embolized branch demonstrates no active bleeding. Microcatheter retracted and utilized to select the origin of a second embolized branch. Right Vasa recta branch (2): This branch has been previously embolized however with delayed imaging, there is a rounded vascular blush distal to the coils which correlates with the CTA earlier today. Additional micro embolization: The origin of this branch was successfully embolized with the deployment of 2 additional coils (first coil was a 2 mm x 2 cm Terumo Azur coil followed by a 1 mm x 2 cm Ruby low-profile coil. This completely embolized the branch back to its origin off of the marginal artery. Final completion angiogram no longer demonstrates the abnormal vascularity/blushing. Additional angiography within the marginal branch demonstrated no additional abnormal vascularity with preservation of the marginal artery of [place] to the right colon. At this point the microcatheter was removed. C2 catheter also removed. Hemostasis obtained at the right CFA puncture site with the CELT device. Patient tolerated the procedure well.  No immediate complication. IMPRESSION: Additional successful peripheral micro coil embolization of a right Vasa recta branch off of the SMA at the site of right hepatic flexure mild arterial oozing demonstrated by CTA earlier  today. Electronically Signed   By: CHRISTELLA.  Shick M.D.   On: 05/14/2024 17:03   IR Angiogram Selective Each Additional Vessel Result Date: 05/14/2024 INDICATION: Recurrent acute right colon lower GI bleeding by CTA, associated blood-loss anemia, status post prior angio embolization and endoscopic clipping EXAM: ULTRASOUND GUIDANCE FOR VASCULAR ACCESS SMA, RIGHT COLIC, MARGINAL BRANCH, AND 2 PERIPHERAL SEPARATE VASA RECTA BRANCHES CATHETERIZATIONS, ANGIOGRAMS, AND ADDITIONAL PERIPHERAL MICRO COIL EMBOLIZATION MEDICATIONS: 1% lidocaine  local. ANESTHESIA/SEDATION: Moderate (conscious) sedation was employed during this procedure. A total of Versed  1.5 mg and Fentanyl  50 mcg was administered intravenously by the radiology nurse. Total intra-service moderate Sedation Time: 60 minutes. The patient's level of consciousness and vital signs were monitored continuously by radiology nursing throughout the procedure under my direct supervision. CONTRAST:  85 cc omni 300 FLUOROSCOPY: Radiation Exposure Index (as provided by the fluoroscopic device): 2,418 mGy Kerma COMPLICATIONS: None immediate. PROCEDURE: Informed consent was obtained from the patient following explanation of the procedure, risks, benefits and alternatives. The patient understands, agrees and consents for the procedure. All questions were addressed. A time out was performed prior to the initiation of the procedure. Maximal barrier sterile technique utilized including caps, mask, sterile gowns, sterile gloves, large sterile drape, hand hygiene, and Betadine  prep. Previous imaging reviewed. Under sterile  conditions and local anesthesia, ultrasound micropuncture access performed of the patent right common femoral artery. Images obtained for documentation of the patent right common femoral artery. Five French sheath inserted over a Bentson guidewire. C2 catheter advanced to select the SMA. Repeat SMA angiogram performed. SMA: SMA origin remains widely patent  including the colic and jejunal branches. Initially no active bleeding demonstrated in the previously embolized right hepatic flexure distribution. Progreat single angle microcatheter was advanced through the C2 catheter into the SMA trunk. Fathom guidewire again utilized to select the right colic branch. Selective right colic angiogram performed. Right colic: Right colic remains patent leading into the right marginal artery. Peripheral Vasa recta branches to the hepatic flexure remain patent. Initially no active bleeding demonstrated. Microcatheter retracted and utilized to select the origin of 1 of the previously embolized 3 branches. Right vasa recta branch (1): Injection of the previously embolized branch demonstrates no active bleeding. Microcatheter retracted and utilized to select the origin of a second embolized branch. Right Vasa recta branch (2): This branch has been previously embolized however with delayed imaging, there is a rounded vascular blush distal to the coils which correlates with the CTA earlier today. Additional micro embolization: The origin of this branch was successfully embolized with the deployment of 2 additional coils (first coil was a 2 mm x 2 cm Terumo Azur coil followed by a 1 mm x 2 cm Ruby low-profile coil. This completely embolized the branch back to its origin off of the marginal artery. Final completion angiogram no longer demonstrates the abnormal vascularity/blushing. Additional angiography within the marginal branch demonstrated no additional abnormal vascularity with preservation of the marginal artery of [place] to the right colon. At this point the microcatheter was removed. C2 catheter also removed. Hemostasis obtained at the right CFA puncture site with the CELT device. Patient tolerated the procedure well.  No immediate complication. IMPRESSION: Additional successful peripheral micro coil embolization of a right Vasa recta branch off of the SMA at the site of right  hepatic flexure mild arterial oozing demonstrated by CTA earlier today. Electronically Signed   By: CHRISTELLA.  Shick M.D.   On: 05/14/2024 17:03   IR Angiogram Selective Each Additional Vessel Result Date: 05/14/2024 INDICATION: Recurrent acute right colon lower GI bleeding by CTA, associated blood-loss anemia, status post prior angio embolization and endoscopic clipping EXAM: ULTRASOUND GUIDANCE FOR VASCULAR ACCESS SMA, RIGHT COLIC, MARGINAL BRANCH, AND 2 PERIPHERAL SEPARATE VASA RECTA BRANCHES CATHETERIZATIONS, ANGIOGRAMS, AND ADDITIONAL PERIPHERAL MICRO COIL EMBOLIZATION MEDICATIONS: 1% lidocaine  local. ANESTHESIA/SEDATION: Moderate (conscious) sedation was employed during this procedure. A total of Versed  1.5 mg and Fentanyl  50 mcg was administered intravenously by the radiology nurse. Total intra-service moderate Sedation Time: 60 minutes. The patient's level of consciousness and vital signs were monitored continuously by radiology nursing throughout the procedure under my direct supervision. CONTRAST:  85 cc omni 300 FLUOROSCOPY: Radiation Exposure Index (as provided by the fluoroscopic device): 2,418 mGy Kerma COMPLICATIONS: None immediate. PROCEDURE: Informed consent was obtained from the patient following explanation of the procedure, risks, benefits and alternatives. The patient understands, agrees and consents for the procedure. All questions were addressed. A time out was performed prior to the initiation of the procedure. Maximal barrier sterile technique utilized including caps, mask, sterile gowns, sterile gloves, large sterile drape, hand hygiene, and Betadine  prep. Previous imaging reviewed. Under sterile conditions and local anesthesia, ultrasound micropuncture access performed of the patent right common femoral artery. Images obtained for documentation of the patent right common femoral artery.  Five French sheath inserted over a Bentson guidewire. C2 catheter advanced to select the SMA. Repeat SMA  angiogram performed. SMA: SMA origin remains widely patent including the colic and jejunal branches. Initially no active bleeding demonstrated in the previously embolized right hepatic flexure distribution. Progreat single angle microcatheter was advanced through the C2 catheter into the SMA trunk. Fathom guidewire again utilized to select the right colic branch. Selective right colic angiogram performed. Right colic: Right colic remains patent leading into the right marginal artery. Peripheral Vasa recta branches to the hepatic flexure remain patent. Initially no active bleeding demonstrated. Microcatheter retracted and utilized to select the origin of 1 of the previously embolized 3 branches. Right vasa recta branch (1): Injection of the previously embolized branch demonstrates no active bleeding. Microcatheter retracted and utilized to select the origin of a second embolized branch. Right Vasa recta branch (2): This branch has been previously embolized however with delayed imaging, there is a rounded vascular blush distal to the coils which correlates with the CTA earlier today. Additional micro embolization: The origin of this branch was successfully embolized with the deployment of 2 additional coils (first coil was a 2 mm x 2 cm Terumo Azur coil followed by a 1 mm x 2 cm Ruby low-profile coil. This completely embolized the branch back to its origin off of the marginal artery. Final completion angiogram no longer demonstrates the abnormal vascularity/blushing. Additional angiography within the marginal branch demonstrated no additional abnormal vascularity with preservation of the marginal artery of [place] to the right colon. At this point the microcatheter was removed. C2 catheter also removed. Hemostasis obtained at the right CFA puncture site with the CELT device. Patient tolerated the procedure well.  No immediate complication. IMPRESSION: Additional successful peripheral micro coil embolization of a  right Vasa recta branch off of the SMA at the site of right hepatic flexure mild arterial oozing demonstrated by CTA earlier today. Electronically Signed   By: CHRISTELLA.  Shick M.D.   On: 05/14/2024 17:03   IR US  Guide Vasc Access Right Result Date: 05/14/2024 INDICATION: Recurrent acute right colon lower GI bleeding by CTA, associated blood-loss anemia, status post prior angio embolization and endoscopic clipping EXAM: ULTRASOUND GUIDANCE FOR VASCULAR ACCESS SMA, RIGHT COLIC, MARGINAL BRANCH, AND 2 PERIPHERAL SEPARATE VASA RECTA BRANCHES CATHETERIZATIONS, ANGIOGRAMS, AND ADDITIONAL PERIPHERAL MICRO COIL EMBOLIZATION MEDICATIONS: 1% lidocaine  local. ANESTHESIA/SEDATION: Moderate (conscious) sedation was employed during this procedure. A total of Versed  1.5 mg and Fentanyl  50 mcg was administered intravenously by the radiology nurse. Total intra-service moderate Sedation Time: 60 minutes. The patient's level of consciousness and vital signs were monitored continuously by radiology nursing throughout the procedure under my direct supervision. CONTRAST:  85 cc omni 300 FLUOROSCOPY: Radiation Exposure Index (as provided by the fluoroscopic device): 2,418 mGy Kerma COMPLICATIONS: None immediate. PROCEDURE: Informed consent was obtained from the patient following explanation of the procedure, risks, benefits and alternatives. The patient understands, agrees and consents for the procedure. All questions were addressed. A time out was performed prior to the initiation of the procedure. Maximal barrier sterile technique utilized including caps, mask, sterile gowns, sterile gloves, large sterile drape, hand hygiene, and Betadine  prep. Previous imaging reviewed. Under sterile conditions and local anesthesia, ultrasound micropuncture access performed of the patent right common femoral artery. Images obtained for documentation of the patent right common femoral artery. Five French sheath inserted over a Bentson guidewire. C2  catheter advanced to select the SMA. Repeat SMA angiogram performed. SMA: SMA origin remains widely patent  including the colic and jejunal branches. Initially no active bleeding demonstrated in the previously embolized right hepatic flexure distribution. Progreat single angle microcatheter was advanced through the C2 catheter into the SMA trunk. Fathom guidewire again utilized to select the right colic branch. Selective right colic angiogram performed. Right colic: Right colic remains patent leading into the right marginal artery. Peripheral Vasa recta branches to the hepatic flexure remain patent. Initially no active bleeding demonstrated. Microcatheter retracted and utilized to select the origin of 1 of the previously embolized 3 branches. Right vasa recta branch (1): Injection of the previously embolized branch demonstrates no active bleeding. Microcatheter retracted and utilized to select the origin of a second embolized branch. Right Vasa recta branch (2): This branch has been previously embolized however with delayed imaging, there is a rounded vascular blush distal to the coils which correlates with the CTA earlier today. Additional micro embolization: The origin of this branch was successfully embolized with the deployment of 2 additional coils (first coil was a 2 mm x 2 cm Terumo Azur coil followed by a 1 mm x 2 cm Ruby low-profile coil. This completely embolized the branch back to its origin off of the marginal artery. Final completion angiogram no longer demonstrates the abnormal vascularity/blushing. Additional angiography within the marginal branch demonstrated no additional abnormal vascularity with preservation of the marginal artery of [place] to the right colon. At this point the microcatheter was removed. C2 catheter also removed. Hemostasis obtained at the right CFA puncture site with the CELT device. Patient tolerated the procedure well.  No immediate complication. IMPRESSION: Additional  successful peripheral micro coil embolization of a right Vasa recta branch off of the SMA at the site of right hepatic flexure mild arterial oozing demonstrated by CTA earlier today. Electronically Signed   By: CHRISTELLA.  Shick M.D.   On: 05/14/2024 17:03   CT Angio Abd/Pel w/ and/or w/o Result Date: 05/14/2024 EXAM: CTA ABDOMEN AND PELVIS WITH AND WITHOUT CONTRAST 05/14/2024 05:19:00 AM TECHNIQUE: CTA images of the abdomen and pelvis with and without intravenous contrast. Three-dimensional MIP/volume rendered formations were performed. Automated exposure control, iterative reconstruction, and/or weight based adjustment of the mA/kV was utilized to reduce the radiation dose to as low as reasonably achievable. 75 mL (iohexol  (OMNIPAQUE ) 350 MG/ML injection 75 mL IOHEXOL  350 MG/ML SOLN) was administered. COMPARISON: CTA abdomen and pelvis, 2 studies recently dated 05/11/2024 and 05/09/2024. CLINICAL HISTORY: Lower GI bleed with active bleeding at the hepatic flexure on the last CTA abdomen and pelvis. FINDINGS: VASCULATURE: GI BLEED: Active bleeding is again noted into the hepatic flexure just below embolization coils of SMA branches to the hepatic flexure. The active hemorrhage is best seen on axial series 7 images 97 through 99, axial series 10 images 94 through 100, and coronal reconstruction series 19 image 90. No other active GI bleeding is seen on exam. AORTA: Minimal aortic calcific plaques without an aneurysm, dissection, or stenosis. CELIAC TRUNK: The celiac trunk is widely patent. There are nonstenosing ostial calcific plaques. SUPERIOR MESENTERIC ARTERY: The superior mesenteric artery is widely patent. There are nonstenosing ostial calcific plaques. INFERIOR MESENTERIC ARTERY: No acute finding. No occlusion or significant stenosis. RENAL ARTERIES: No acute finding. No occlusion or significant stenosis. ILIAC ARTERIES: The bilateral iliac arteries are patent with no occlusion or significant stenosis. There are  trace calcifications in the common and the aortic arteries. More prominent patchy calcific plaques in the internal iliac arteries. No significant disease in the external iliac arteries. ABDOMEN/PELVIS: LOWER CHEST:  Coarse atelectatic changes at the lower lobes. Lung bases are clear of infiltrates. Decreased density of the cardiac blood pool consistent with anemia. Mild cardiomegaly. LIVER: The liver is unremarkable. GALLBLADDER AND BILE DUCTS: Gallbladder is unremarkable. No biliary ductal dilatation. SPLEEN: The spleen is unremarkable. PANCREAS: The pancreas is unremarkable. ADRENAL GLANDS: Bilateral adrenal glands demonstrate no acute abnormality. No adrenal mass. KIDNEYS, URETERS AND BLADDER: No stones in the kidneys or ureters. No hydronephrosis. No perinephric or periureteral stranding. Urinary bladder is unremarkable. No renal mass. 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 GI AND BOWEL: There is increased generalized fold thickening in the stomach. There are increased perigastric edematous changes. Findings could be from simple gastritis or inflammatory peptic ulcer disease, but whatever the etiology, this was not seen previously. No gastric pneumatosis or portal venous gas is seen. The small bowel is of normal caliber without inflammatory change. The appendix is unremarkable. As above, embolization coils are noted medial to the hepatic flexure with adjacent active GI bleed into the hepatic flexure which was seen 3 days ago. The large bowel wall is of normal thickness. There is sigmoid diverticulosis without evidence of diverticulitis. There is no bowel obstruction. No abnormal bowel wall thickening or distension. REPRODUCTIVE: The uterus is surgically absent. No adnexal mass. PERITONEUM AND RETROPERITONEUM: There is a small amount of free ascites around the liver and in the pelvis. No free hemorrhage or free air is seen. No incarcerated hernia. Small fat-containing umbilical hernia. LYMPH  NODES: No lymphadenopathy. 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 No acute or significant osseous findings. There is increased body wall anasarca. IMPRESSION: 1. Active GI bleeding into the hepatic flexure adjacent to embolization coils, as seen on prior study 3 days ago. No other active GI bleeding. 2. Increased generalized fold thickening in the stomach and increased perigastric edematous changes, not seen previously. Findings could be from gastritis or inflammatory peptic ulcer disease. 3. . Small volume of increased ascites, with increased body wall anasarca. 4. Bosniak 1 and 2 renal cysts. Electronically signed by: Francis Quam MD 05/14/2024 06:02 AM EST RP Workstation: [id number]   CT ANGIO GI BLEED Result Date: 05/11/2024 EXAM: CTA ABDOMEN AND PELVIS WITH CONTRAST 05/09/2024 TECHNIQUE: CTA images of the abdomen and pelvis with intravenous contrast. 75 mL iohexol  (OMNIPAQUE ) 350 MG/ML injection. Three-dimensional MIP/volume rendered formations were performed. Automated exposure control, iterative reconstruction, and/or weight based adjustment of the mA/kV was utilized to reduce the radiation dose to as low as reasonably achievable. COMPARISON: CT angiogram of the abdomen and pelvis dated 05/09/2024. CLINICAL HISTORY: Lower GI bleed (Ped 0-17y). FINDINGS: VASCULATURE: GI BLEED: There is some active hemorrhage within the hepatic flexure seen on image 116 of series 5 and image 81 of coronal series 14. AORTA: No acute finding. No abdominal aortic aneurysm. No dissection. CELIAC TRUNK: No acute finding. No occlusion or significant stenosis. SUPERIOR MESENTERIC ARTERY: No acute finding. No occlusion or significant stenosis. INFERIOR MESENTERIC ARTERY: No acute finding. No occlusion or significant stenosis. RENAL ARTERIES: No acute finding. No occlusion or significant stenosis. ILIAC ARTERIES: No acute finding. No occlusion or significant  stenosis. ABDOMEN/PELVIS: LOWER CHEST: There are streaky peribronchovascular opacities again demonstrated within the lower lobes bilaterally, slightly worse on the left, as before. LIVER: The liver is unremarkable. GALLBLADDER AND BILE DUCTS: There is vicarious excretion within the gallbladder. No biliary ductal dilatation. SPLEEN: The spleen is unremarkable. PANCREAS: The pancreas is unremarkable. ADRENAL GLANDS:  Bilateral adrenal glands demonstrate no acute abnormality. KIDNEYS, URETERS AND BLADDER: There are a couple of simple cysts again seen arising from the right kidney and a small simple cyst also on the left. No stones in the kidneys or ureters. No hydronephrosis. No perinephric or periureteral stranding. Urinary bladder is unremarkable. GI AND BOWEL: There are embolization coils again demonstrated posterior to the hepatic flexure. There is some active hemorrhage within the hepatic flexure seen on image 116 of series 5 and image 81 of coronal series 14. There are a few scattered colonic diverticula present. Stomach and duodenal sweep demonstrate no acute abnormality. There is no bowel obstruction. No abnormal bowel wall thickening or distension. REPRODUCTIVE: The patient is status post hysterectomy and bilateral salpingo-oophorectomy. PERITONEUM AND RETROPERITONEUM: No ascites or free air. LYMPH NODES: No lymphadenopathy. BONES AND SOFT TISSUES: No acute abnormality of the bones. No acute soft tissue abnormality. IMPRESSION: 1. Active hemorrhage within the hepatic flexure. 2. Embolization coils posterior to the hepatic flexure. 3. Scattered colonic diverticula. 4. Streaky peribronchovascular opacities in the bilateral lower lobes, slightly worse on the left, similar to prior. Electronically signed by: Evalene Coho MD 05/11/2024 05:01 AM EST RP Workstation: [id number]   CT Angio Abd/Pel w/ and/or w/o Result Date: 05/09/2024 EXAM: CTA ABDOMEN AND PELVIS WITH AND WITHOUT CONTRAST 05/09/2024 09:40:06 AM  TECHNIQUE: CTA images of the abdomen and pelvis with and without intravenous contrast. Three-dimensional MIP/volume rendered formations were performed. Automated exposure control, iterative reconstruction, and/or weight based adjustment of the mA/kV was utilized to reduce the radiation dose to as low as reasonably achievable. 75 mL of iohexol  (OMNIPAQUE ) 350 MG/ML injection was administered. COMPARISON: 05/07/2024. Subsegmental atelectasis identified within both lung bases. CLINICAL HISTORY: Lower GI bleed. FINDINGS: VASCULATURE: AORTA: Mild aortic atherosclerotic calcification. No acute finding. No abdominal aortic aneurysm. No dissection. CELIAC TRUNK: Status post coil embolization branches of the superior mesenteric artery branch supplying the right colon and hepatic flexure region. No acute finding. No occlusion or significant stenosis. SUPERIOR MESENTERIC ARTERY: Status post coil embolization branches of the superior mesenteric artery branch supplying the right colon and hepatic flexure region. No acute finding. No occlusion or significant stenosis. RENAL ARTERIES: No acute finding. No occlusion or significant stenosis. ILIAC ARTERIES: No acute finding. No occlusion or significant stenosis. LIVER: The liver is unremarkable. GALLBLADDER AND BILE DUCTS: Contrast material fills the gallbladder lumen. No biliary ductal dilatation. SPLEEN: The spleen is unremarkable. PANCREAS: The pancreas is unremarkable. ADRENAL GLANDS: Bilateral adrenal glands demonstrate no acute abnormality. KIDNEYS, URETERS AND BLADDER: Bosniak class 2 right kidney cysts are unchanged. The largest arises off the lateral cortex of the right kidney measuring 4.3 cm, image 98/18. No stones in the kidneys or ureters. No hydronephrosis. No perinephric or periureteral stranding. Urinary bladder is unremarkable. GI AND BOWEL: Stomach and duodenal sweep demonstrate no acute abnormality. There is no bowel obstruction. There is no brisk arterial phase  extravasation of contrast into the colonic lumen. There is new low attenuation wall thickening involving the ascending colon with hyperemia of the mucosa on the delayed images with trace amount of intraluminal contrast noted on the delayed images. The trace intraluminal contrast on delayed images could represent very slow or intermittent bleeding. Colonic diverticulosis without signs of acute diverticulitis. There are no signs of active intraluminal contrast extravasation to indicate recurrent GI bleeding. There is no abnormal bowel dilatation. No abnormal bowel wall thickening or distension in the remainder of the visualized bowel. REPRODUCTIVE: Status post hysterectomy. No adnexal mass. PERITONEUM AND  RETRPERITONEUM: No ascites or free air. No free fluid or fluid collections identified. LUNG BASE: Subsegmental atelectasis identified within both lung bases. LYMPH NODES: No lymphadenopathy. BONES AND SOFT TISSUES: No acute abnormality of the bones. No acute soft tissue abnormality. IMPRESSION: 1. No evidence of active arterial phase  intraluminal contrast extravasation. 2. New low attenuation wall thickening involving the ascending colon with hyperemia of the mucosa and trace intraluminal contrast on delayed images in the setting of recent coil embolization of branches of the superior mesenteric artery supplying the right colon/hepatic flexure region; differential considerations include ischemic colitis, infectious colitis, or post-embolization changes. 3. Colonic diverticulosis without signs of acute diverticulitis. Electronically signed by: Waddell Calk MD 05/09/2024 10:07 AM EST RP Workstation: [id number]   IR Angiogram Visceral Selective Result Date: 05/08/2024 INDICATION: Acute right colon lower GI bleeding by CTA, associated blood-loss anemia, requiring volume resuscitation and transfusion EXAM: ULTRASOUND GUIDANCE FOR VASCULAR ACCESS SMA, RIGHT COLIC BRANCH, MARGINAL BRANCH, AND 3 SEPARATE VASA RECTA  BRANCHES CATHETERIZATIONS, ANGIOGRAMS, AND PERIPHERAL MICRO COIL EMBOLIZATION MEDICATIONS: 1% lidocaine  local. ANESTHESIA/SEDATION: MAC provided by the anesthesia service CONTRAST:  130 cc omni 300 FLUOROSCOPY: Radiation Exposure Index (as provided by the fluoroscopic device): 1,509 mGy Kerma COMPLICATIONS: None immediate. PROCEDURE: Informed consent was obtained from the patient following explanation of the procedure, risks, benefits and alternatives. The patient understands, agrees and consents for the procedure. All questions were addressed. A time out was performed prior to the initiation of the procedure. Maximal barrier sterile technique utilized including caps, mask, sterile gowns, sterile gloves, large sterile drape, hand hygiene, and Betadine  prep. Previous imaging reviewed. Under sterile conditions and local anesthesia, ultrasound micropuncture access performed of the patent right common femoral artery. Images obtained for documentation of the patent right common femoral artery. Five French sheath inserted over a Bentson guidewire. C2 catheter advanced initially utilized to select the SMA origin. SMA angiogram performed. SMA: SMA trunk and its branches are patent. Prominent right colic artery noted where there is active contrast extravasation into the hepatic flexure of the colon which correlates with the positive CTA findings. Progreat angled tip microcatheter was advanced through the C2 catheter into the SMA trunk. Fathom guidewire was utilized to select the right colic branch. Selective right colic angiogram performed. Right colic: Right colic branches widely patent as well as the marginal and Vasa recta branches to the abnormal hepatic flexure. In the hepatic flexure, there is abnormal hypervascularity, tortuosity, and slow arterial oozing in the area of active bleeding. There are prominent early draining veins. 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 Microcatheter advanced into a  supplying Vasa recta branch. Selective Vasa recta angiography performed. Right Vasa recta branch (1): Tortuous abnormal hypervascularity and blushing noted with early draining vein compatible with a supplying branch to the area of angio dysplasia. Catheter access point is safe for peripheral micro coil embolization. There was successful deployment of terumo 2 mm microcoils within the branch ranging in size from 4 cm to 2 cm. After deployment of the microcoils, this peripheral Vasa recta branch is occluded. Repeat injection through the microcatheter demonstrates additional peripheral branch supply to the abnormal vascularity. Microcatheter was retracted and utilized to select a second right vasa recta branch more superiorly. Right Vasa recta branch (2): This branch also supplies the tortuous abnormal hypervascular area with blushing and early draining veins. Catheter access location is safe for micro coil embolization. Additional 2 mm microcoils were successfully deployed in this second branch. Following micro coil embolization, repeat angiography confirms significant  reduction in the Vasa recta supply to the angio dysplasia. Repeat injection of the marginal artery demonstrates a third superior branch with abnormal vascular supply. This branch was also successfully accessed with the microcatheter peripherally. Right Vasa recta branch (3): This branch also supplies the abnormal vascularity with early draining veins. Peripheral access is also safe for micro coil embolization. This branch was occluded with 2 mm and 3 mm microcoils successfully. Final post embolization angiogram through the marginal artery demonstrates significant reduction in the arterial supply to the area of presumed angio dysplasia. There is overall preserved marginal artery supply to the right colon. No additional large Vasa recta branch visualized supplying the abnormal vascularity. At this point the procedure was terminated. Microcatheter and C2  catheter both removed. Hemostasis obtained at the right CFA access site with the CELT device successfully. Patient tolerated the procedure well. There were no immediate complications. There was improvement in the patient's hemodynamics following the embolization. 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 This area correlated with the CTA. Electronically Signed   By: CHRISTELLA.  Shick M.D.   On: 05/08/2024 13:57   IR US  Guide Vasc Access Right Result Date: 05/08/2024 INDICATION: Acute right colon lower GI bleeding by CTA, associated blood-loss anemia, requiring volume resuscitation and transfusion EXAM: ULTRASOUND GUIDANCE FOR VASCULAR ACCESS SMA, RIGHT COLIC BRANCH, MARGINAL BRANCH, AND 3 SEPARATE VASA RECTA BRANCHES CATHETERIZATIONS, ANGIOGRAMS, AND PERIPHERAL MICRO COIL EMBOLIZATION MEDICATIONS: 1% lidocaine  local. ANESTHESIA/SEDATION: MAC provided by the anesthesia service CONTRAST:  130 cc omni 300 FLUOROSCOPY: Radiation Exposure Index (as provided by the fluoroscopic device): 1,509 mGy Kerma COMPLICATIONS: None immediate. PROCEDURE: Informed consent was obtained from the patient following explanation of the procedure, risks, benefits and alternatives. The patient understands, agrees and consents for the procedure. All questions were addressed. A time out was performed prior to the initiation of the procedure. Maximal barrier sterile technique utilized including caps, mask, sterile gowns, sterile gloves, large sterile drape, hand hygiene, and Betadine  prep. Previous imaging reviewed. Under sterile conditions and local anesthesia, ultrasound micropuncture access performed of the patent right common femoral artery. Images obtained for documentation of the patent right common femoral artery. Five French sheath inserted over a Bentson guidewire. C2 catheter advanced  initially utilized to select the SMA origin. SMA angiogram performed. SMA: SMA trunk and its branches are patent. Prominent right colic artery noted where there is active contrast extravasation into the hepatic flexure of the colon which correlates with the positive CTA findings. Progreat angled tip microcatheter was advanced through the C2 catheter into the SMA trunk. Fathom guidewire was utilized to select the right colic branch. Selective right colic angiogram performed. Right colic: Right colic branches widely patent as well as the marginal and Vasa recta branches to the abnormal hepatic flexure. In the hepatic flexure, there is abnormal hypervascularity, tortuosity, and slow arterial oozing in the area of active bleeding. There are prominent early draining veins. 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 Microcatheter advanced into a supplying Vasa recta branch. Selective Vasa recta angiography performed. Right Vasa recta branch (1): Tortuous abnormal hypervascularity and blushing noted with early draining vein compatible with a supplying branch to the area of angio dysplasia. Catheter access point is safe for peripheral micro coil embolization. There was successful deployment of terumo 2 mm microcoils within the branch ranging in size from 4 cm to 2 cm. After  deployment of the microcoils, this peripheral Vasa recta branch is occluded. Repeat injection through the microcatheter demonstrates additional peripheral branch supply to the abnormal vascularity. Microcatheter was retracted and utilized to select a second right vasa recta branch more superiorly. Right Vasa recta branch (2): This branch also supplies the tortuous abnormal hypervascular area with blushing and early draining veins. Catheter access location is safe for micro coil embolization. Additional 2 mm microcoils were successfully deployed in this second branch. Following micro coil embolization, repeat angiography confirms significant  reduction in the Vasa recta supply to the angio dysplasia. Repeat injection of the marginal artery demonstrates a third superior branch with abnormal vascular supply. This branch was also successfully accessed with the microcatheter peripherally. Right Vasa recta branch (3): This branch also supplies the abnormal vascularity with early draining veins. Peripheral access is also safe for micro coil embolization. This branch was occluded with 2 mm and 3 mm microcoils successfully. Final post embolization angiogram through the marginal artery demonstrates significant reduction in the arterial supply to the area of presumed angio dysplasia. There is overall preserved marginal artery supply to the right colon. No additional large Vasa recta branch visualized supplying the abnormal vascularity. At this point the procedure was terminated. Microcatheter and C2 catheter both removed. Hemostasis obtained at the right CFA access site with the CELT device successfully. Patient tolerated the procedure well. There were no immediate complications. There was improvement in the patient's hemodynamics following the embolization. 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 This area correlated with the CTA. Electronically Signed   By: CHRISTELLA.  Shick M.D.   On: 05/08/2024 13:57   IR Angiogram Selective Each Additional Vessel Result Date: 05/08/2024 INDICATION: Acute right colon lower GI bleeding by CTA, associated blood-loss anemia, requiring volume resuscitation and transfusion EXAM: ULTRASOUND GUIDANCE FOR VASCULAR ACCESS SMA, RIGHT COLIC BRANCH, MARGINAL BRANCH, AND 3 SEPARATE VASA RECTA BRANCHES CATHETERIZATIONS, ANGIOGRAMS, AND PERIPHERAL MICRO COIL EMBOLIZATION MEDICATIONS: 1% lidocaine  local. ANESTHESIA/SEDATION: MAC provided by the anesthesia service CONTRAST:  130 cc omni 300  FLUOROSCOPY: Radiation Exposure Index (as provided by the fluoroscopic device): 1,509 mGy Kerma COMPLICATIONS: None immediate. PROCEDURE: Informed consent was obtained from the patient following explanation of the procedure, risks, benefits and alternatives. The patient understands, agrees and consents for the procedure. All questions were addressed. A time out was performed prior to the initiation of the procedure. Maximal barrier sterile technique utilized including caps, mask, sterile gowns, sterile gloves, large sterile drape, hand hygiene, and Betadine  prep. Previous imaging reviewed. Under sterile conditions and local anesthesia, ultrasound micropuncture access performed of the patent right common femoral artery. Images obtained for documentation of the patent right common femoral artery. Five French sheath inserted over a Bentson guidewire. C2 catheter advanced initially utilized to select the SMA origin. SMA angiogram performed. SMA: SMA trunk and its branches are patent. Prominent right colic artery noted where there is active contrast extravasation into the hepatic flexure of the colon which correlates with the positive CTA findings. Progreat angled tip microcatheter was advanced through the C2 catheter into the SMA trunk. Fathom guidewire was utilized to select the right colic branch. Selective right colic angiogram performed. Right colic: Right colic branches widely patent as well as the marginal and Vasa recta branches to the abnormal hepatic flexure. In the hepatic flexure, there is abnormal hypervascularity, tortuosity, and slow arterial oozing in the  area of active bleeding. There are prominent early draining veins. 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 Microcatheter advanced into a supplying Vasa recta branch. Selective Vasa recta angiography performed. Right Vasa recta branch (1): Tortuous abnormal hypervascularity and blushing noted with early draining vein compatible with a  supplying branch to the area of angio dysplasia. Catheter access point is safe for peripheral micro coil embolization. There was successful deployment of terumo 2 mm microcoils within the branch ranging in size from 4 cm to 2 cm. After deployment of the microcoils, this peripheral Vasa recta branch is occluded. Repeat injection through the microcatheter demonstrates additional peripheral branch supply to the abnormal vascularity. Microcatheter was retracted and utilized to select a second right vasa recta branch more superiorly. Right Vasa recta branch (2): This branch also supplies the tortuous abnormal hypervascular area with blushing and early draining veins. Catheter access location is safe for micro coil embolization. Additional 2 mm microcoils were successfully deployed in this second branch. Following micro coil embolization, repeat angiography confirms significant reduction in the Vasa recta supply to the angio dysplasia. Repeat injection of the marginal artery demonstrates a third superior branch with abnormal vascular supply. This branch was also successfully accessed with the microcatheter peripherally. Right Vasa recta branch (3): This branch also supplies the abnormal vascularity with early draining veins. Peripheral access is also safe for micro coil embolization. This branch was occluded with 2 mm and 3 mm microcoils successfully. Final post embolization angiogram through the marginal artery demonstrates significant reduction in the arterial supply to the area of presumed angio dysplasia. There is overall preserved marginal artery supply to the right colon. No additional large Vasa recta branch visualized supplying the abnormal vascularity. At this point the procedure was terminated. Microcatheter and C2 catheter both removed. Hemostasis obtained at the right CFA access site with the CELT device successfully. Patient tolerated the procedure well. There were no immediate complications. There was  improvement in the patient's hemodynamics following the embolization. 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 This area correlated with the CTA. Electronically Signed   By: CHRISTELLA.  Shick M.D.   On: 05/08/2024 13:57   IR Angiogram Selective Each Additional Vessel Result Date: 05/08/2024 INDICATION: Acute right colon lower GI bleeding by CTA, associated blood-loss anemia, requiring volume resuscitation and transfusion EXAM: ULTRASOUND GUIDANCE FOR VASCULAR ACCESS SMA, RIGHT COLIC BRANCH, MARGINAL BRANCH, AND 3 SEPARATE VASA RECTA BRANCHES CATHETERIZATIONS, ANGIOGRAMS, AND PERIPHERAL MICRO COIL EMBOLIZATION MEDICATIONS: 1% lidocaine  local. ANESTHESIA/SEDATION: MAC provided by the anesthesia service CONTRAST:  130 cc omni 300 FLUOROSCOPY: Radiation Exposure Index (as provided by the fluoroscopic device): 1,509 mGy Kerma COMPLICATIONS: None immediate. PROCEDURE: Informed consent was obtained from the patient following explanation of the procedure, risks, benefits and alternatives. The patient understands, agrees and consents for the procedure. All questions were addressed. A time out was performed prior to the initiation of the procedure. Maximal barrier sterile technique utilized including caps, mask, sterile gowns, sterile gloves, large sterile drape, hand hygiene, and Betadine  prep. Previous imaging reviewed. Under sterile conditions and local anesthesia, ultrasound micropuncture access performed of the patent right common femoral artery. Images obtained for documentation of the patent right common femoral artery. Five French sheath inserted over a Bentson guidewire. C2 catheter advanced initially utilized to select the SMA origin. SMA angiogram performed. SMA: SMA trunk and its branches are patent. Prominent right  colic artery noted where there is active  contrast extravasation into the hepatic flexure of the colon which correlates with the positive CTA findings. Progreat angled tip microcatheter was advanced through the C2 catheter into the SMA trunk. Fathom guidewire was utilized to select the right colic branch. Selective right colic angiogram performed. Right colic: Right colic branches widely patent as well as the marginal and Vasa recta branches to the abnormal hepatic flexure. In the hepatic flexure, there is abnormal hypervascularity, tortuosity, and slow arterial oozing in the area of active bleeding. There are prominent early draining veins. 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 Microcatheter advanced into a supplying Vasa recta branch. Selective Vasa recta angiography performed. Right Vasa recta branch (1): Tortuous abnormal hypervascularity and blushing noted with early draining vein compatible with a supplying branch to the area of angio dysplasia. Catheter access point is safe for peripheral micro coil embolization. There was successful deployment of terumo 2 mm microcoils within the branch ranging in size from 4 cm to 2 cm. After deployment of the microcoils, this peripheral Vasa recta branch is occluded. Repeat injection through the microcatheter demonstrates additional peripheral branch supply to the abnormal vascularity. Microcatheter was retracted and utilized to select a second right vasa recta branch more superiorly. Right Vasa recta branch (2): This branch also supplies the tortuous abnormal hypervascular area with blushing and early draining veins. Catheter access location is safe for micro coil embolization. Additional 2 mm microcoils were successfully deployed in this second branch. Following micro coil embolization, repeat angiography confirms significant reduction in the Vasa recta supply to the angio dysplasia. Repeat injection of the marginal artery demonstrates a third superior branch with abnormal vascular supply. This  branch was also successfully accessed with the microcatheter peripherally. Right Vasa recta branch (3): This branch also supplies the abnormal vascularity with early draining veins. Peripheral access is also safe for micro coil embolization. This branch was occluded with 2 mm and 3 mm microcoils successfully. Final post embolization angiogram through the marginal artery demonstrates significant reduction in the arterial supply to the area of presumed angio dysplasia. There is overall preserved marginal artery supply to the right colon. No additional large Vasa recta branch visualized supplying the abnormal vascularity. At this point the procedure was terminated. Microcatheter and C2 catheter both removed. Hemostasis obtained at the right CFA access site with the CELT device successfully. Patient tolerated the procedure well. There were no immediate complications. There was improvement in the patient's hemodynamics following the embolization. 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 This area correlated with the CTA. Electronically Signed   By: CHRISTELLA.  Shick M.D.   On: 05/08/2024 13:57   IR EMBO ART  VEN HEMORR LYMPH EXTRAV  INC GUIDE ROADMAPPING Result Date: 05/08/2024 INDICATION: Acute right colon lower GI bleeding by CTA, associated blood-loss anemia, requiring volume resuscitation and transfusion EXAM: ULTRASOUND GUIDANCE FOR VASCULAR ACCESS SMA, RIGHT COLIC BRANCH, MARGINAL BRANCH, AND 3 SEPARATE VASA RECTA BRANCHES CATHETERIZATIONS, ANGIOGRAMS, AND PERIPHERAL MICRO COIL EMBOLIZATION MEDICATIONS: 1% lidocaine  local. ANESTHESIA/SEDATION: MAC provided by the anesthesia service CONTRAST:  130 cc omni 300 FLUOROSCOPY: Radiation Exposure Index (as provided by the fluoroscopic device): 1,509 mGy Kerma COMPLICATIONS: None immediate. PROCEDURE: Informed consent was  obtained from the patient following explanation of the procedure, risks, benefits and alternatives. The patient understands, agrees and consents for the procedure. All questions were  addressed. A time out was performed prior to the initiation of the procedure. Maximal barrier sterile technique utilized including caps, mask, sterile gowns, sterile gloves, large sterile drape, hand hygiene, and Betadine  prep. Previous imaging reviewed. Under sterile conditions and local anesthesia, ultrasound micropuncture access performed of the patent right common femoral artery. Images obtained for documentation of the patent right common femoral artery. Five French sheath inserted over a Bentson guidewire. C2 catheter advanced initially utilized to select the SMA origin. SMA angiogram performed. SMA: SMA trunk and its branches are patent. Prominent right colic artery noted where there is active contrast extravasation into the hepatic flexure of the colon which correlates with the positive CTA findings. Progreat angled tip microcatheter was advanced through the C2 catheter into the SMA trunk. Fathom guidewire was utilized to select the right colic branch. Selective right colic angiogram performed. Right colic: Right colic branches widely patent as well as the marginal and Vasa recta branches to the abnormal hepatic flexure. In the hepatic flexure, there is abnormal hypervascularity, tortuosity, and slow arterial oozing in the area of active bleeding. There are prominent early draining veins. 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 Microcatheter advanced into a supplying Vasa recta branch. Selective Vasa recta angiography performed. Right Vasa recta branch (1): Tortuous abnormal hypervascularity and blushing noted with early draining vein compatible with a supplying branch to the area of angio dysplasia. Catheter access point is safe for peripheral micro coil embolization. There was successful deployment of terumo 2  mm microcoils within the branch ranging in size from 4 cm to 2 cm. After deployment of the microcoils, this peripheral Vasa recta branch is occluded. Repeat injection through the microcatheter demonstrates additional peripheral branch supply to the abnormal vascularity. Microcatheter was retracted and utilized to select a second right vasa recta branch more superiorly. Right Vasa recta branch (2): This branch also supplies the tortuous abnormal hypervascular area with blushing and early draining veins. Catheter access location is safe for micro coil embolization. Additional 2 mm microcoils were successfully deployed in this second branch. Following micro coil embolization, repeat angiography confirms significant reduction in the Vasa recta supply to the angio dysplasia. Repeat injection of the marginal artery demonstrates a third superior branch with abnormal vascular supply. This branch was also successfully accessed with the microcatheter peripherally. Right Vasa recta branch (3): This branch also supplies the abnormal vascularity with early draining veins. Peripheral access is also safe for micro coil embolization. This branch was occluded with 2 mm and 3 mm microcoils successfully. Final post embolization angiogram through the marginal artery demonstrates significant reduction in the arterial supply to the area of presumed angio dysplasia. There is overall preserved marginal artery supply to the right colon. No additional large Vasa recta branch visualized supplying the abnormal vascularity. At this point the procedure was terminated. Microcatheter and C2 catheter both removed. Hemostasis obtained at the right CFA access site with the CELT device successfully. Patient tolerated the procedure well. There were no immediate complications. There was improvement in the patient's hemodynamics following the embolization. IMPRESSION: Successful peripheral micro coil embolization of 3 right Vasa recta branches off of the  SMA supplying an area of right colon hepatic flexure angio dysplasia with mild arterial bleeding. This area correlated with the CTA. Electronically Signed   By: CHRISTELLA.  Shick M.D.   On: 05/08/2024 13:57   IR Angiogram Selective Each Additional Vessel Result Date: 05/08/2024 INDICATION: Acute right colon lower GI bleeding by CTA, associated blood-loss anemia, requiring volume resuscitation and  transfusion EXAM: ULTRASOUND GUIDANCE FOR VASCULAR ACCESS SMA, RIGHT COLIC BRANCH, MARGINAL BRANCH, AND 3 SEPARATE VASA RECTA BRANCHES CATHETERIZATIONS, ANGIOGRAMS, AND PERIPHERAL MICRO COIL EMBOLIZATION MEDICATIONS: 1% lidocaine  local. ANESTHESIA/SEDATION: MAC provided by the anesthesia service CONTRAST:  130 cc omni 300 FLUOROSCOPY: Radiation Exposure Index (as provided by the fluoroscopic device): 1,509 mGy Kerma COMPLICATIONS: None immediate. PROCEDURE: Informed consent was obtained from the patient following explanation of the procedure, risks, benefits and alternatives. The patient understands, agrees and consents for the procedure. All questions were addressed. A time out was performed prior to the initiation of the procedure. Maximal barrier sterile technique utilized including caps, mask, sterile gowns, sterile gloves, large sterile drape, hand hygiene, and Betadine  prep. Previous imaging reviewed. Under sterile conditions and local anesthesia, ultrasound micropuncture access performed of the patent right common femoral artery. Images obtained for documentation of the patent right common femoral artery. Five French sheath inserted over a Bentson guidewire. C2 catheter advanced initially utilized to select the SMA origin. SMA angiogram performed. SMA: SMA trunk and its branches are patent. Prominent right colic artery noted where there is active contrast extravasation into the hepatic flexure of the colon which correlates with the positive CTA findings. Progreat angled tip microcatheter was advanced through the C2  catheter into the SMA trunk. Fathom guidewire was utilized to select the right colic branch. Selective right colic angiogram performed. Right colic: Right colic branches widely patent as well as the marginal and Vasa recta branches to the abnormal hepatic flexure. In the hepatic flexure, there is abnormal hypervascularity, tortuosity, and slow arterial oozing in the area of active bleeding. There are prominent early draining veins. 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 Microcatheter advanced into a supplying Vasa recta branch. Selective Vasa recta angiography performed. Right Vasa recta branch (1): Tortuous abnormal hypervascularity and blushing noted with early draining vein compatible with a supplying branch to the area of angio dysplasia. Catheter access point is safe for peripheral micro coil embolization. There was successful deployment of terumo 2 mm microcoils within the branch ranging in size from 4 cm to 2 cm. After deployment of the microcoils, this peripheral Vasa recta branch is occluded. Repeat injection through the microcatheter demonstrates additional peripheral branch supply to the abnormal vascularity. Microcatheter was retracted and utilized to select a second right vasa recta branch more superiorly. Right Vasa recta branch (2): This branch also supplies the tortuous abnormal hypervascular area with blushing and early draining veins. Catheter access location is safe for micro coil embolization. Additional 2 mm microcoils were successfully deployed in this second branch. Following micro coil embolization, repeat angiography confirms significant reduction in the Vasa recta supply to the angio dysplasia. Repeat injection of the marginal artery demonstrates a third superior branch with abnormal vascular supply. This branch was also successfully accessed with the microcatheter peripherally. Right Vasa recta branch (3): This branch also supplies the abnormal vascularity with early  draining veins. Peripheral access is also safe for micro coil embolization. This branch was occluded with 2 mm and 3 mm microcoils successfully. Final post embolization angiogram through the marginal artery demonstrates significant reduction in the arterial supply to the area of presumed angio dysplasia. There is overall preserved marginal artery supply to the right colon. No additional large Vasa recta branch visualized supplying the abnormal vascularity. At this point the procedure was terminated. Microcatheter and C2 catheter both removed. Hemostasis obtained at the right CFA access site with the CELT device successfully. Patient tolerated the procedure well. There were no immediate  complications. There was improvement in the patient's hemodynamics following the embolization. 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 This area correlated with the CTA. Electronically Signed   By: CHRISTELLA.  Shick M.D.   On: 05/08/2024 13:57   CT Angio Abd/Pel w/ and/or w/o Addendum Date: 05/07/2024 ADDENDUM REPORT: 05/07/2024 10:57 ADDENDUM: Correction: The site of active gastrointestinal bleeding is in the hepatic flexure of the colon, not splenic. Electronically Signed   By: Lynwood Landy Raddle M.D.   On: 05/07/2024 10:57   Addendum Date: 05/07/2024 ADDENDUM REPORT: 05/07/2024 10:41 ADDENDUM: Critical Value/emergent results were called by telephone at the time of interpretation on 05/07/2024 at 10:40 am to provider Bari, charge nurse, who verbally acknowledged these results and will notify Dr. Arlon immediately. Electronically Signed   By: Lynwood Landy Raddle M.D.   On: 05/07/2024 10:41   Result Date: 05/07/2024 CLINICAL DATA:  Lower gastrointestinal bleeding EXAM: CTA ABDOMEN AND PELVIS WITHOUT AND WITH CONTRAST TECHNIQUE: Multidetector CT imaging of the abdomen and pelvis  was performed using the standard protocol during bolus administration of intravenous contrast. Multiplanar reconstructed images and MIPs were obtained and reviewed to evaluate the vascular anatomy. RADIATION DOSE REDUCTION: This exam was performed according to the departmental dose-optimization program which includes automated exposure control, adjustment of the mA and/or kV according to patient size and/or use of iterative reconstruction technique. CONTRAST:  OMNIPAQUE  IOHEXOL  350 MG/ML SOLN COMPARISON:  April 08, 2024 FINDINGS: VASCULAR Aorta: Normal caliber aorta without aneurysm, dissection, vasculitis or significant stenosis. Celiac: Patent without evidence of aneurysm, dissection, vasculitis or significant stenosis. SMA: Patent without evidence of aneurysm, dissection, vasculitis or significant stenosis. Renals: Both renal arteries are patent without evidence of aneurysm, dissection, vasculitis, fibromuscular dysplasia or significant stenosis. IMA: Patent without evidence of aneurysm, dissection, vasculitis or significant stenosis. Inflow: Patent without evidence of aneurysm, dissection, vasculitis or significant stenosis. Proximal Outflow: Bilateral common femoral and visualized portions of the superficial and profunda femoral arteries are patent without evidence of aneurysm, dissection, vasculitis or significant stenosis. Veins: No obvious venous abnormality within the limitations of this arterial phase study. Review of the MIP images confirms the above findings. NON-VASCULAR Lower chest: No acute abnormality. Hepatobiliary: No focal liver abnormality is seen. No gallstones, gallbladder wall thickening, or biliary dilatation. Pancreas: Unremarkable. No pancreatic ductal dilatation or surrounding inflammatory changes. Spleen: Normal in size without focal abnormality. Adrenals/Urinary Tract: Adrenal glands appear normal. Stable right renal cyst. No hydronephrosis or renal obstruction noted. Urinary  bladder is unremarkable. Stomach/Bowel: Stomach is within normal limits. Appendix appears normal. No evidence of bowel wall thickening, distention, or inflammatory changes. However, there appears to be an area active contrast extravasation involving the splenic flexure of the colon consistent with active gastrointestinal bleeding. Lymphatic: No adenopathy. Reproductive: Status post hysterectomy. No adnexal masses. Other: No abdominal wall hernia or abnormality. No abdominopelvic ascites. Musculoskeletal: No acute or significant osseous findings. IMPRESSION: Active contrast extravasation is seen involving the splenic flexure of the colon consistent with active gastrointestinal bleeding. Electronically Signed: By: Lynwood Landy Raddle M.D. On: 05/07/2024 10:33    Microbiology: Results for orders placed or performed during the hospital encounter of 05/06/24  MRSA Next Gen by PCR, Nasal     Status: None   Collection Time: 05/07/24  9:33 PM   Specimen: Nasal Mucosa; Nasal Swab  Result Value Ref Range Status  MRSA by PCR Next Gen NOT DETECTED NOT DETECTED Final    Comment: (NOTE) The GeneXpert MRSA Assay (FDA approved for NASAL specimens only), is one component of a comprehensive MRSA colonization surveillance program. It is not intended to diagnose MRSA infection nor to guide or monitor treatment for MRSA infections. Test performance is not FDA approved in patients less than 9 years old. Performed at [facility], [address], [place], [place] [address]     Labs: CBC: Recent Labs  Lab 05/18/24 [phone] 05/18/24 1147 05/21/24 0617 05/21/24 1215 05/22/24 0509 05/22/24 0936 05/22/24 1852 05/22/24 2343 05/23/24 0840 05/23/24 1910 05/24/24 0232  WBC 8.8  --  16.8*  --  15.1*  --   --   --  12.5*  --  10.5  HGB 7.9*   < > 8.1*   < > 8.2*   < > 7.9* 7.2* 7.7* 7.4* 7.2*  HCT 23.8*   < > 23.8*   < > 24.4*   < > 23.7* 20.8* 23.5* 22.2* 22.2*  MCV 91.5  --  88.1  --  87.5  --   --   --  88.0   --  87.7  PLT 163  --  151  --  133*  --   --   --  181  --  216   < > = values in this interval not displayed.   Basic Metabolic Panel: Recent Labs  Lab 05/18/24 0806 05/19/24 0528 05/20/24 0629 05/21/24 0617 05/22/24 0509 05/23/24 0840 05/24/24 0232  NA 140 142 143 139 135 138 139  K 4.9 4.3 3.9 4.9 4.4 3.8 3.8  CL 109 111 111 106 104 106 108  CO2 27 26 27 26 23 26 26   GLUCOSE 155* 125* 99 192* 164* 141* 129*  BUN 11 12 11 15 13 9  7*  CREATININE 0.68 0.64 0.68 0.90 0.95 0.70 0.63  CALCIUM  7.9* 7.8* 7.6* 7.9* 7.6* 8.2* 8.1*  MG 1.7 1.7 1.8  --   --   --   --   PHOS 3.4 3.7  --   --   --   --   --    Liver Function Tests: Recent Labs  Lab 05/22/24 0509  AST 24  ALT 16  ALKPHOS 56  BILITOT 0.2  PROT 4.2*  ALBUMIN  2.3*   CBG: Recent Labs  Lab 05/23/24 1610 05/23/24 2024 05/23/24 2049 05/24/24 0603 05/24/24 1058  GLUCAP 123* 231* 208* 127* 193*    Discharge time spent: greater than 30 minutes.  Signed: Nydia Distance, MD [facility] 05/24/2024 "

## 2024-05-24 NOTE — Plan of Care (Signed)
 " Problem: Education: Goal: Knowledge of General Education information will improve Description: Including pain rating scale, medication(s)/side effects and non-pharmacologic comfort measures Outcome: Progressing   Problem: Health Behavior/Discharge Planning: Goal: Ability to manage health-related needs will improve Outcome: Progressing   Problem: Clinical Measurements: Goal: Ability to maintain clinical measurements within normal limits will improve Outcome: Progressing Goal: Will remain free from infection Outcome: Progressing Goal: Diagnostic test results will improve Outcome: Progressing Goal: Respiratory complications will improve Outcome: Progressing Goal: Cardiovascular complication will be avoided Outcome: Progressing   Problem: Activity: Goal: Risk for activity intolerance will decrease Outcome: Progressing   Problem: Nutrition: Goal: Adequate nutrition will be maintained Outcome: Progressing   Problem: Coping: Goal: Level of anxiety will decrease Outcome: Progressing   Problem: Elimination: Goal: Will not experience complications related to bowel motility Outcome: Progressing Goal: Will not experience complications related to urinary retention Outcome: Progressing   Problem: Pain Managment: Goal: General experience of comfort will improve and/or be controlled Outcome: Progressing   Problem: Safety: Goal: Ability to remain free from injury will improve Outcome: Progressing   Problem: Skin Integrity: Goal: Risk for impaired skin integrity will decrease Outcome: Progressing   Problem: Education: Goal: Ability to describe self-care measures that may prevent or decrease complications (Diabetes Survival Skills Education) will improve Outcome: Progressing Goal: Individualized Educational Video(s) Outcome: Progressing   Problem: Coping: Goal: Ability to adjust to condition or change in health will improve Outcome: Progressing   Problem: Fluid  Volume: Goal: Ability to maintain a balanced intake and output will improve Outcome: Progressing   Problem: Health Behavior/Discharge Planning: Goal: Ability to identify and utilize available resources and services will improve Outcome: Progressing Goal: Ability to manage health-related needs will improve Outcome: Progressing   Problem: Metabolic: Goal: Ability to maintain appropriate glucose levels will improve Outcome: Progressing   Problem: Nutritional: Goal: Maintenance of adequate nutrition will improve Outcome: Progressing Goal: Progress toward achieving an optimal weight will improve Outcome: Progressing   Problem: Skin Integrity: Goal: Risk for impaired skin integrity will decrease Outcome: Progressing   Problem: Tissue Perfusion: Goal: Adequacy of tissue perfusion will improve Outcome: Progressing   Problem: Education: Goal: Understanding of CV disease, CV risk reduction, and recovery process will improve Outcome: Progressing Goal: Individualized Educational Video(s) Outcome: Progressing   Problem: Activity: Goal: Ability to return to baseline activity level will improve Outcome: Progressing   Problem: Cardiovascular: Goal: Ability to achieve and maintain adequate cardiovascular perfusion will improve Outcome: Progressing Goal: Vascular access site(s) Level 0-1 will be maintained Outcome: Progressing   Problem: Health Behavior/Discharge Planning: Goal: Ability to safely manage health-related needs after discharge will improve Outcome: Progressing   Problem: Education: Goal: Knowledge of the prescribed therapeutic regimen will improve Outcome: Progressing   Problem: Bowel/Gastric: Goal: Gastrointestinal status for postoperative course will improve Outcome: Progressing   Problem: Cardiac: Goal: Ability to maintain an adequate cardiac output Outcome: Progressing Goal: Will show no evidence of cardiac arrhythmias Outcome: Progressing   Problem:  Nutritional: Goal: Will attain and maintain optimal nutritional status Outcome: Progressing   Problem: Neurological: Goal: Will regain or maintain usual level of consciousness Outcome: Progressing   Problem: Clinical Measurements: Goal: Ability to maintain clinical measurements within normal limits Outcome: Progressing Goal: Postoperative complications will be avoided or minimized Outcome: Progressing   Problem: Respiratory: Goal: Will regain and/or maintain adequate ventilation Outcome: Progressing Goal: Respiratory status will improve Outcome: Progressing   Problem: Skin Integrity: Goal: Demonstrates signs of wound healing without infection Outcome: Progressing   Problem: Urinary  Elimination: Goal: Will remain free from infection Outcome: Progressing Goal: Ability to achieve and maintain adequate urine output Outcome: Progressing   "

## 2024-05-24 NOTE — Discharge Instructions (Signed)
 [facility]      [facility], [place] [phone]  OPEN ABDOMINAL SURGERY: POST OP INSTRUCTIONS  Always review your discharge instruction sheet given to you by the facility where your surgery was performed.  IF YOU HAVE DISABILITY OR FAMILY LEAVE FORMS, YOU MUST BRING THEM TO THE OFFICE FOR PROCESSING.  PLEASE DO NOT GIVE THEM TO YOUR DOCTOR.  A prescription for pain medication may be given to you upon discharge.  Take your pain medication as prescribed, if needed.  If narcotic pain medicine is not needed, then you may take acetaminophen  (Tylenol ) or ibuprofen (Advil) as needed. Take your usually prescribed medications unless otherwise directed. If you need a refill on your pain medication, please contact your pharmacy. They will contact our office to request authorization.  Prescriptions will not be filled after 5pm or on week-ends. You should follow a light diet the first few days after arrival home, such as soup and crackers, pudding, etc.unless your doctor has advised otherwise. A high-fiber, low fat diet can be resumed as tolerated.   Be sure to include lots of fluids daily. Most patients will experience some swelling and bruising on the chest and neck area.  Ice packs will help.  Swelling and bruising can take several days to resolve Most patients will experience some swelling and bruising in the area of the incision. Ice pack will help. Swelling and bruising can take several days to resolve..  It is common to experience some constipation if taking pain medication after surgery.  Increasing fluid intake and taking a stool softener will usually help or prevent this problem from occurring.  A mild laxative (Milk of Magnesia or Miralax) should be taken according to package directions if there are no bowel movements after 48 hours.  You may have steri-strips (small skin tapes) in place directly over the incision.  These strips should be left on the skin for 7-10 days.  If your surgeon used skin  glue on the incision, you may shower in 24 hours.  The glue will flake off over the next 2-3 weeks.  Any sutures or staples will be removed at the office during your follow-up visit. You may find that a light gauze bandage over your incision may keep your staples from being rubbed or pulled. You may shower and replace the bandage daily. ACTIVITIES:  You may resume regular (light) daily activities beginning the next day--such as daily self-care, walking, climbing stairs--gradually increasing activities as tolerated.  You may have sexual intercourse when it is comfortable.  Refrain from any heavy lifting or straining until approved by your doctor. You may drive when you no longer are taking prescription pain medication, you can comfortably wear a seatbelt, and you can safely maneuver your car and apply brakes Return to Work: ___________________________________ Rosine should see your doctor in the office for a follow-up appointment approximately two weeks after your surgery.  Make sure that you call for this appointment within a day or two after you arrive home to insure a convenient appointment time. OTHER INSTRUCTIONS:  _____________________________________________________________ _____________________________________________________________  WHEN TO CALL YOUR DOCTOR: Fever over 101.0 Inability to urinate Nausea and/or vomiting Extreme swelling or bruising Continued bleeding from incision. Increased pain, redness, or drainage from the incision. Difficulty swallowing or breathing Muscle cramping or spasms. Numbness or tingling in hands or feet or around lips.  The clinic staff is available to answer your questions during regular business hours.  Please don't hesitate to call and ask to speak to one of  the nurses if you have concerns.  For further questions, please visit [email or web address]

## 2024-05-24 NOTE — TOC Transition Note (Signed)
 Transition of Care [facility]) - Discharge Note   Patient Details  Name: Casey Johnston MRN: [id number] Date of Birth: 1960-07-28  [facility] CM/SW Contact:  Marval Gell, RN Phone Number: 05/24/2024, 11:58 AM   Clinical Narrative:     Beatris w patient at bedside. Discussed DME and follow up therapy. She has declined DME and therapies. Meds to [facility] pharmacy, no further ICM needs for DC.     Barriers to Discharge: Continued Medical Work up   Patient Goals and CMS Choice            Discharge Placement                       Discharge Plan and Services Additional resources added to the After Visit Summary for                                       Social Drivers of Health (SDOH) Interventions SDOH Screenings   Food Insecurity: No Food Insecurity (05/06/2024)  Housing: Low Risk (05/06/2024)  Transportation Needs: No Transportation Needs (05/06/2024)  Utilities: Not At Risk (05/06/2024)  Financial Resource Strain: Low Risk (03/26/2023)   Received from [facility]  Physical Activity: Sufficiently Active (03/26/2023)   Received from [facility]  Social Connections: Moderately Isolated (03/26/2023)   Received from [facility]  Stress: Stress Concern Present (03/26/2023)   Received from [facility]  Tobacco Use: High Risk (05/20/2024)  Health Literacy: Low Risk (03/26/2023)   Received from [facility]     Readmission Risk Interventions    05/07/2024   11:36 AM  Readmission Risk Prevention Plan  Transportation Screening Complete  Home Care Screening Complete  Medication Review (RN CM) Complete

## 2024-05-24 NOTE — Progress Notes (Signed)
 Mobility Specialist: Progress Note   05/24/24 1000  Mobility  Activity Ambulated with assistance  Level of Assistance Contact guard assist, steadying assist  Assistive Device Other (Comment) (IV pole)  Distance Ambulated (ft) 115 ft  Activity Response Tolerated well  Mobility Referral Yes  Mobility visit 1 Mobility  Mobility Specialist Start Time (ACUTE ONLY) 0950  Mobility Specialist Stop Time (ACUTE ONLY) 0957  Mobility Specialist Time Calculation (min) (ACUTE ONLY) 7 min    Pt received in bed, agreeable to mobility session. Feeling a little groggy after meds. Ambulated to the BR first, then completed hallway ambulation w/ IV pole. Fatigued throughout session. Returned to room. SpO2 93% on RA at EOB. Left on EOB with all needs met, call bell in reach.   Ileana Lute Mobility Specialist Please contact via SecureChat or Rehab office at [phone]

## 2024-05-24 NOTE — Progress Notes (Signed)
 Patient ID: Casey Johnston, female   DOB: Feb 05, 1961, 64 y.o.   MRN: [id number] 4 Days Post-Op    Subjective: Eating well.  Moved her bowels again this am with no blood present.  Mobilizing well.  Pain well controlled  Objective: Vital signs in last 24 hours: Temp:  [98.2 F (36.8 C)-99.4 F (37.4 C)] 98.4 F (36.9 C) (01/04 0720) Pulse Rate:  [69-99] 73 (01/04 0720) Resp:  [13-20] 13 (01/04 0720) BP: (115-190)/(49-74) 143/60 (01/04 0720) SpO2:  [93 %-96 %] 93 % (01/04 0720) Last BM Date : 05/23/24  Intake/Output from previous day: 01/03 0701 - 01/04 0700 In: 220 [P.O.:220] Out: 900 [Urine:900] Intake/Output this shift: No intake/output data recorded.  PE: Gen: NAD Abd: soft, minimally tender, incisions c/d/I with dermabond, obese, but ND  Lab Results: CBC  Recent Labs    05/23/24 0840 05/23/24 1910 05/24/24 0232  WBC 12.5*  --  10.5  HGB 7.7* 7.4* 7.2*  HCT 23.5* 22.2* 22.2*  PLT 181  --  216   BMET Recent Labs    05/23/24 0840 05/24/24 0232  NA 138 139  K 3.8 3.8  CL 106 108  CO2 26 26  GLUCOSE 141* 129*  BUN 9 7*  CREATININE 0.70 0.63  CALCIUM  8.2* 8.1*   PT/INR No results for input(s): LABPROT, INR in the last 72 hours. ABG No results for input(s): PHART, HCO3 in the last 72 hours.  Invalid input(s): PCO2, PO2  Studies/Results: No results found.  Anti-infectives: Anti-infectives (From admission, onward)    Start     Dose/Rate Route Frequency Ordered Stop   05/20/24 1230  cefoTEtan  (CEFOTAN ) 1 g in sodium chloride  0.9 % 100 mL IVPB  Status:  Discontinued        1 g 200 mL/hr over 30 Minutes Intravenous Every 12 hours 05/20/24 1218 05/23/24 0931       Assessment/Plan: LGIB, s/p right colon IR embolization and colonoscopy 05/12/2024 - 1 cm ulcer at hepatic flexure status post 2 hemoclips. Repeat CTA 12/25 AM with persistent bleed and underwent repeat AE.   POD 4, S/P laparoscopic R colectomy 12/31, Dr. Lyndel - Hgb  7.2 last night.  Overall stable.  No further bleeding -DC foley 1/3, voiding well -mobilize -tolerating soft diet with no issues,  -patient doesn't like Ensure.  Casey Johnston is a bit better -overall patient is surgically stable and doing well.  Stable for DC home from our standpoint -our office will contact her with her follow up -Rx for pain medication sent to [facility] pharmacy for when they open at 10am -d/w primary service   FEN - soft, Breeze VTE - SCDs; hold LMWH for bleeding issues ID - cefotetan , will DC this given no needs post op   Dispo - ok to DC home today from our standpoint  LOS: 18 days    Burnard FORBES Banter, PA-C  [facility] Use [email or web address] to contact on call provider  05/24/2024

## 2024-05-25 LAB — SURGICAL PATHOLOGY

## 2024-05-26 ENCOUNTER — Telehealth: Payer: Self-pay | Admitting: Gastroenterology

## 2024-05-26 NOTE — Telephone Encounter (Signed)
 Left a message to schedule a hospital follow up appt with courtney Mahon... she stated non-urgent

## 2024-06-26 ENCOUNTER — Other Ambulatory Visit: Payer: Self-pay | Admitting: Gastroenterology

## 2024-06-26 ENCOUNTER — Telehealth: Payer: Self-pay | Admitting: *Deleted

## 2024-06-26 MED ORDER — PANTOPRAZOLE SODIUM 40 MG PO TBEC: 40.0000 mg | DELAYED_RELEASE_TABLET | Freq: Two times a day (BID) | ORAL | 5 refills | Status: AC

## 2024-06-26 NOTE — Telephone Encounter (Signed)
 Pt called and states that her belly button has been hurting for a couple of days. Her belly button is red and has a smell to it. She wanted to know if there was some cream she could use on her belly button. I advised her that if her belly button had a smell and was red and hurt she needs to go to the ED.

## 2024-06-26 NOTE — Telephone Encounter (Signed)
Spoke to pt, informed her of recommendations. Pt voiced understanding.  

## 2024-07-30 ENCOUNTER — Ambulatory Visit: Admitting: Gastroenterology
